# Patient Record
Sex: Female | Born: 1960 | Race: White | Hispanic: No | Marital: Single | Smoking: Never smoker
Health system: Southern US, Community
[De-identification: ages and names within clinical notes are randomized; demographics above are authoritative.]

## PROBLEM LIST (undated history)

## (undated) DIAGNOSIS — N812 Incomplete uterovaginal prolapse: Secondary | ICD-10-CM

## (undated) DIAGNOSIS — J45909 Unspecified asthma, uncomplicated: Secondary | ICD-10-CM

## (undated) DIAGNOSIS — E875 Hyperkalemia: Secondary | ICD-10-CM

## (undated) DIAGNOSIS — F0781 Postconcussional syndrome: Secondary | ICD-10-CM

## (undated) DIAGNOSIS — R404 Transient alteration of awareness: Secondary | ICD-10-CM

## (undated) DIAGNOSIS — E785 Hyperlipidemia, unspecified: Secondary | ICD-10-CM

## (undated) DIAGNOSIS — N289 Disorder of kidney and ureter, unspecified: Secondary | ICD-10-CM

## (undated) DIAGNOSIS — G4730 Sleep apnea, unspecified: Secondary | ICD-10-CM

## (undated) DIAGNOSIS — F329 Major depressive disorder, single episode, unspecified: Secondary | ICD-10-CM

## (undated) DIAGNOSIS — F419 Anxiety disorder, unspecified: Secondary | ICD-10-CM

## (undated) DIAGNOSIS — F319 Bipolar disorder, unspecified: Secondary | ICD-10-CM

## (undated) DIAGNOSIS — R160 Hepatomegaly, not elsewhere classified: Secondary | ICD-10-CM

## (undated) DIAGNOSIS — R011 Cardiac murmur, unspecified: Secondary | ICD-10-CM

## (undated) DIAGNOSIS — S060X9A Concussion with loss of consciousness of unspecified duration, initial encounter: Secondary | ICD-10-CM

## (undated) DIAGNOSIS — M722 Plantar fascial fibromatosis: Secondary | ICD-10-CM

## (undated) DIAGNOSIS — M1990 Unspecified osteoarthritis, unspecified site: Secondary | ICD-10-CM

## (undated) DIAGNOSIS — J449 Chronic obstructive pulmonary disease, unspecified: Secondary | ICD-10-CM

## (undated) DIAGNOSIS — I10 Essential (primary) hypertension: Secondary | ICD-10-CM

## (undated) DIAGNOSIS — E042 Nontoxic multinodular goiter: Secondary | ICD-10-CM

## (undated) DIAGNOSIS — B9689 Other specified bacterial agents as the cause of diseases classified elsewhere: Secondary | ICD-10-CM

## (undated) DIAGNOSIS — I770 Arteriovenous fistula, acquired: Secondary | ICD-10-CM

## (undated) DIAGNOSIS — R4789 Other speech disturbances: Secondary | ICD-10-CM

## (undated) DIAGNOSIS — I509 Heart failure, unspecified: Secondary | ICD-10-CM

## (undated) DIAGNOSIS — J40 Bronchitis, not specified as acute or chronic: Secondary | ICD-10-CM

## (undated) DIAGNOSIS — I471 Supraventricular tachycardia, unspecified: Secondary | ICD-10-CM

## (undated) DIAGNOSIS — N814 Uterovaginal prolapse, unspecified: Secondary | ICD-10-CM

## (undated) DIAGNOSIS — I519 Heart disease, unspecified: Secondary | ICD-10-CM

## (undated) DIAGNOSIS — F32A Depression, unspecified: Secondary | ICD-10-CM

## (undated) DIAGNOSIS — I209 Angina pectoris, unspecified: Secondary | ICD-10-CM

## (undated) DIAGNOSIS — R270 Ataxia, unspecified: Principal | ICD-10-CM

## (undated) DIAGNOSIS — N189 Chronic kidney disease, unspecified: Secondary | ICD-10-CM

## (undated) DIAGNOSIS — I2510 Atherosclerotic heart disease of native coronary artery without angina pectoris: Secondary | ICD-10-CM

## (undated) DIAGNOSIS — J019 Acute sinusitis, unspecified: Secondary | ICD-10-CM

## (undated) DIAGNOSIS — N951 Menopausal and female climacteric states: Secondary | ICD-10-CM

## (undated) DIAGNOSIS — K219 Gastro-esophageal reflux disease without esophagitis: Secondary | ICD-10-CM

## (undated) DIAGNOSIS — N3281 Overactive bladder: Secondary | ICD-10-CM

## (undated) DIAGNOSIS — D510 Vitamin B12 deficiency anemia due to intrinsic factor deficiency: Secondary | ICD-10-CM

## (undated) DIAGNOSIS — E119 Type 2 diabetes mellitus without complications: Secondary | ICD-10-CM

## (undated) DIAGNOSIS — N3945 Continuous leakage: Secondary | ICD-10-CM

## (undated) DIAGNOSIS — K589 Irritable bowel syndrome without diarrhea: Secondary | ICD-10-CM

## (undated) DIAGNOSIS — Z992 Dependence on renal dialysis: Secondary | ICD-10-CM

## (undated) DIAGNOSIS — E0590 Thyrotoxicosis, unspecified without thyrotoxic crisis or storm: Secondary | ICD-10-CM

## (undated) DIAGNOSIS — N200 Calculus of kidney: Secondary | ICD-10-CM

## (undated) DIAGNOSIS — I6529 Occlusion and stenosis of unspecified carotid artery: Secondary | ICD-10-CM

## (undated) DIAGNOSIS — E039 Hypothyroidism, unspecified: Secondary | ICD-10-CM

## (undated) DIAGNOSIS — H532 Diplopia: Secondary | ICD-10-CM

## (undated) DIAGNOSIS — M109 Gout, unspecified: Secondary | ICD-10-CM

## (undated) DIAGNOSIS — R569 Unspecified convulsions: Secondary | ICD-10-CM

## (undated) DIAGNOSIS — Z8349 Family history of other endocrine, nutritional and metabolic diseases: Secondary | ICD-10-CM

## (undated) HISTORY — PX: HERNIA REPAIR: SHX51

## (undated) HISTORY — DX: Postconcussional syndrome: F07.81

## (undated) HISTORY — DX: Overactive bladder: N32.81

## (undated) HISTORY — DX: Transient alteration of awareness: R40.4

## (undated) HISTORY — DX: Cardiac murmur, unspecified: R01.1

## (undated) HISTORY — DX: Type 2 diabetes mellitus without complications: E11.9

## (undated) HISTORY — DX: Morbid (severe) obesity due to excess calories: E66.01

## (undated) HISTORY — DX: Dependence on renal dialysis: Z99.2

## (undated) HISTORY — PX: COLONOSCOPY: SHX174

## (undated) HISTORY — DX: Disorder of kidney and ureter, unspecified: N28.9

## (undated) HISTORY — DX: Occlusion and stenosis of unspecified carotid artery: I65.29

## (undated) HISTORY — DX: Essential (primary) hypertension: I10

## (undated) HISTORY — DX: Acute sinusitis, unspecified: J01.90

## (undated) HISTORY — DX: Vitamin B12 deficiency anemia due to intrinsic factor deficiency: D51.0

## (undated) HISTORY — DX: Gastro-esophageal reflux disease without esophagitis: K21.9

## (undated) HISTORY — PX: OTHER SURGICAL HISTORY: SHX169

## (undated) HISTORY — PX: ENDOMETRIAL BIOPSY: SHX622

## (undated) HISTORY — DX: Hyperlipidemia, unspecified: E78.5

## (undated) HISTORY — DX: Ataxia, unspecified: R27.0

## (undated) HISTORY — DX: Major depressive disorder, single episode, unspecified: F32.9

## (undated) HISTORY — DX: Unspecified convulsions: R56.9

## (undated) HISTORY — DX: Nontoxic multinodular goiter: E04.2

## (undated) HISTORY — DX: Concussion with loss of consciousness of unspecified duration, initial encounter: S06.0X9A

## (undated) HISTORY — DX: Continuous leakage: N39.45

## (undated) HISTORY — DX: Anxiety disorder, unspecified: F41.9

## (undated) HISTORY — DX: Chronic kidney disease, unspecified: N18.9

## (undated) HISTORY — DX: Diplopia: H53.2

## (undated) HISTORY — DX: Depression, unspecified: F32.A

## (undated) HISTORY — DX: Bipolar disorder, unspecified: F31.9

## (undated) HISTORY — DX: Other specified bacterial agents as the cause of diseases classified elsewhere: B96.89

## (undated) HISTORY — DX: Sleep apnea, unspecified: G47.30

## (undated) HISTORY — DX: Family history of other endocrine, nutritional and metabolic diseases: Z83.49

## (undated) HISTORY — DX: Thyrotoxicosis, unspecified without thyrotoxic crisis or storm: E05.90

## (undated) HISTORY — DX: Chronic obstructive pulmonary disease, unspecified: J44.9

## (undated) HISTORY — DX: Heart disease, unspecified: I51.9

## (undated) HISTORY — PX: PORT A CATH REVISION: SHX6033

## (undated) HISTORY — DX: Hyperkalemia: E87.5

## (undated) HISTORY — DX: Bronchitis, not specified as acute or chronic: J40

## (undated) HISTORY — DX: Other speech disturbances: R47.89

## (undated) HISTORY — PX: CHOLECYSTECTOMY: SHX55

## (undated) HISTORY — DX: Unspecified asthma, uncomplicated: J45.909

---

## 2004-05-24 ENCOUNTER — Ambulatory Visit: Payer: Self-pay | Admitting: Obstetrics and Gynecology

## 2004-05-31 ENCOUNTER — Ambulatory Visit: Payer: Self-pay | Admitting: Obstetrics and Gynecology

## 2004-12-20 ENCOUNTER — Ambulatory Visit: Payer: Self-pay | Admitting: Internal Medicine

## 2005-11-24 ENCOUNTER — Observation Stay: Payer: Self-pay | Admitting: Internal Medicine

## 2005-11-24 ENCOUNTER — Other Ambulatory Visit: Payer: Self-pay

## 2005-12-22 ENCOUNTER — Ambulatory Visit: Payer: Self-pay | Admitting: Internal Medicine

## 2006-11-10 ENCOUNTER — Ambulatory Visit: Payer: Self-pay | Admitting: Internal Medicine

## 2006-12-07 ENCOUNTER — Ambulatory Visit: Payer: Self-pay | Admitting: Urology

## 2006-12-29 ENCOUNTER — Ambulatory Visit: Payer: Self-pay | Admitting: Internal Medicine

## 2007-01-13 ENCOUNTER — Ambulatory Visit: Payer: Self-pay | Admitting: Internal Medicine

## 2007-03-04 ENCOUNTER — Ambulatory Visit: Payer: Self-pay | Admitting: Urology

## 2007-06-02 ENCOUNTER — Ambulatory Visit: Payer: Self-pay | Admitting: Rheumatology

## 2007-06-03 ENCOUNTER — Ambulatory Visit: Payer: Self-pay | Admitting: Rheumatology

## 2007-08-10 ENCOUNTER — Ambulatory Visit: Payer: Self-pay | Admitting: Internal Medicine

## 2007-09-03 ENCOUNTER — Ambulatory Visit: Payer: Self-pay | Admitting: Internal Medicine

## 2007-09-06 ENCOUNTER — Ambulatory Visit: Payer: Self-pay | Admitting: Internal Medicine

## 2007-10-04 ENCOUNTER — Ambulatory Visit: Payer: Self-pay | Admitting: Internal Medicine

## 2007-11-03 ENCOUNTER — Ambulatory Visit: Payer: Self-pay | Admitting: Internal Medicine

## 2007-11-29 ENCOUNTER — Ambulatory Visit: Payer: Self-pay | Admitting: Urology

## 2007-12-06 ENCOUNTER — Ambulatory Visit: Payer: Self-pay | Admitting: Internal Medicine

## 2008-01-04 ENCOUNTER — Ambulatory Visit: Payer: Self-pay | Admitting: Internal Medicine

## 2008-02-03 ENCOUNTER — Ambulatory Visit: Payer: Self-pay | Admitting: Internal Medicine

## 2008-04-12 ENCOUNTER — Ambulatory Visit: Payer: Self-pay | Admitting: Nephrology

## 2008-08-03 ENCOUNTER — Ambulatory Visit: Payer: Self-pay | Admitting: Internal Medicine

## 2008-08-07 ENCOUNTER — Ambulatory Visit: Payer: Self-pay | Admitting: Internal Medicine

## 2008-08-10 ENCOUNTER — Ambulatory Visit: Payer: Self-pay | Admitting: Internal Medicine

## 2008-09-02 ENCOUNTER — Ambulatory Visit: Payer: Self-pay | Admitting: Internal Medicine

## 2008-10-03 ENCOUNTER — Ambulatory Visit: Payer: Self-pay | Admitting: Internal Medicine

## 2008-11-02 ENCOUNTER — Ambulatory Visit: Payer: Self-pay | Admitting: Internal Medicine

## 2008-11-27 ENCOUNTER — Ambulatory Visit: Payer: Self-pay | Admitting: Urology

## 2008-12-03 ENCOUNTER — Ambulatory Visit: Payer: Self-pay | Admitting: Internal Medicine

## 2009-01-03 ENCOUNTER — Ambulatory Visit: Payer: Self-pay | Admitting: Internal Medicine

## 2009-02-02 ENCOUNTER — Ambulatory Visit: Payer: Self-pay | Admitting: Internal Medicine

## 2009-03-05 ENCOUNTER — Ambulatory Visit: Payer: Self-pay | Admitting: Internal Medicine

## 2009-04-04 ENCOUNTER — Ambulatory Visit: Payer: Self-pay | Admitting: Internal Medicine

## 2009-05-05 ENCOUNTER — Ambulatory Visit: Payer: Self-pay | Admitting: Internal Medicine

## 2009-06-05 ENCOUNTER — Ambulatory Visit: Payer: Self-pay | Admitting: Internal Medicine

## 2009-07-03 ENCOUNTER — Ambulatory Visit: Payer: Self-pay | Admitting: Internal Medicine

## 2009-08-01 ENCOUNTER — Ambulatory Visit: Payer: Self-pay | Admitting: Internal Medicine

## 2009-08-03 ENCOUNTER — Ambulatory Visit: Payer: Self-pay | Admitting: Internal Medicine

## 2009-09-02 ENCOUNTER — Ambulatory Visit: Payer: Self-pay | Admitting: Internal Medicine

## 2009-10-03 ENCOUNTER — Ambulatory Visit: Payer: Self-pay | Admitting: Internal Medicine

## 2009-10-12 ENCOUNTER — Ambulatory Visit: Payer: Self-pay | Admitting: Internal Medicine

## 2009-11-02 ENCOUNTER — Ambulatory Visit: Payer: Self-pay | Admitting: Internal Medicine

## 2009-11-28 ENCOUNTER — Ambulatory Visit: Payer: Self-pay | Admitting: Urology

## 2009-12-03 ENCOUNTER — Ambulatory Visit: Payer: Self-pay | Admitting: Internal Medicine

## 2010-01-03 ENCOUNTER — Ambulatory Visit: Payer: Self-pay | Admitting: Internal Medicine

## 2010-01-04 ENCOUNTER — Ambulatory Visit: Payer: Self-pay | Admitting: Internal Medicine

## 2010-01-18 ENCOUNTER — Ambulatory Visit: Payer: Self-pay | Admitting: Surgery

## 2010-01-24 ENCOUNTER — Emergency Department: Payer: Self-pay | Admitting: Unknown Physician Specialty

## 2010-02-01 ENCOUNTER — Emergency Department: Payer: Self-pay | Admitting: Emergency Medicine

## 2010-02-02 ENCOUNTER — Ambulatory Visit: Payer: Self-pay | Admitting: Internal Medicine

## 2010-03-05 ENCOUNTER — Ambulatory Visit: Payer: Self-pay | Admitting: Internal Medicine

## 2010-04-04 ENCOUNTER — Ambulatory Visit: Payer: Self-pay | Admitting: Internal Medicine

## 2010-04-23 ENCOUNTER — Ambulatory Visit: Payer: Self-pay | Admitting: Internal Medicine

## 2010-05-03 ENCOUNTER — Ambulatory Visit: Payer: Self-pay | Admitting: Internal Medicine

## 2010-05-17 ENCOUNTER — Ambulatory Visit: Payer: Self-pay | Admitting: Internal Medicine

## 2010-05-26 ENCOUNTER — Emergency Department: Payer: Self-pay | Admitting: Emergency Medicine

## 2010-06-05 ENCOUNTER — Ambulatory Visit: Payer: Self-pay | Admitting: Internal Medicine

## 2010-07-04 ENCOUNTER — Ambulatory Visit: Payer: Self-pay | Admitting: Internal Medicine

## 2010-07-12 ENCOUNTER — Ambulatory Visit: Payer: Self-pay | Admitting: Internal Medicine

## 2010-08-04 ENCOUNTER — Ambulatory Visit: Payer: Self-pay | Admitting: Internal Medicine

## 2010-10-03 ENCOUNTER — Ambulatory Visit: Payer: Self-pay | Admitting: Urology

## 2010-11-11 ENCOUNTER — Ambulatory Visit: Payer: Self-pay | Admitting: Internal Medicine

## 2010-12-17 ENCOUNTER — Emergency Department: Payer: Self-pay | Admitting: Emergency Medicine

## 2011-03-12 DIAGNOSIS — E039 Hypothyroidism, unspecified: Secondary | ICD-10-CM | POA: Insufficient documentation

## 2011-06-22 ENCOUNTER — Emergency Department: Payer: Self-pay | Admitting: Emergency Medicine

## 2011-06-22 LAB — CBC
HCT: 28.5 % — ABNORMAL LOW (ref 35.0–47.0)
HGB: 9.4 g/dL — ABNORMAL LOW (ref 12.0–16.0)
MCH: 32.3 pg (ref 26.0–34.0)
MCHC: 33.1 g/dL (ref 32.0–36.0)
MCV: 98 fL (ref 80–100)
Platelet: 246 10*3/uL (ref 150–440)
RBC: 2.93 10*6/uL — ABNORMAL LOW (ref 3.80–5.20)
RDW: 12.3 % (ref 11.5–14.5)
WBC: 9.9 10*3/uL (ref 3.6–11.0)

## 2011-06-22 LAB — BASIC METABOLIC PANEL
Anion Gap: 12 (ref 7–16)
BUN: 93 mg/dL — ABNORMAL HIGH (ref 7–18)
Calcium, Total: 11.2 mg/dL — ABNORMAL HIGH (ref 8.5–10.1)
Chloride: 105 mmol/L (ref 98–107)
Co2: 22 mmol/L (ref 21–32)
Creatinine: 2.5 mg/dL — ABNORMAL HIGH (ref 0.60–1.30)
EGFR (African American): 26 — ABNORMAL LOW
EGFR (Non-African Amer.): 22 — ABNORMAL LOW
Glucose: 177 mg/dL — ABNORMAL HIGH (ref 65–99)
Osmolality: 311 (ref 275–301)
Potassium: 4.3 mmol/L (ref 3.5–5.1)
Sodium: 139 mmol/L (ref 136–145)

## 2011-06-22 LAB — CK TOTAL AND CKMB (NOT AT ARMC)
CK, Total: 49 U/L (ref 21–215)
CK-MB: <0.5 ng/mL — ABNORMAL LOW (ref 0.5–3.6)

## 2011-06-22 LAB — TROPONIN I
Troponin-I: <0.02 ng/mL
Troponin-I: <0.02 ng/mL

## 2011-07-04 ENCOUNTER — Emergency Department: Payer: Self-pay | Admitting: Emergency Medicine

## 2011-07-04 LAB — URINALYSIS, COMPLETE
Bilirubin,UR: NEGATIVE
Blood: NEGATIVE
Glucose,UR: NEGATIVE mg/dL (ref 0–75)
Ketone: NEGATIVE
Leukocyte Esterase: NEGATIVE
Nitrite: NEGATIVE
Ph: 5 (ref 4.5–8.0)
Protein: 30
RBC,UR: 1 /HPF (ref 0–5)
Specific Gravity: 1.008 (ref 1.003–1.030)
Squamous Epithelial: 1
WBC UR: 1 /HPF (ref 0–5)

## 2011-07-04 LAB — COMPREHENSIVE METABOLIC PANEL
Albumin: 3.8 g/dL (ref 3.4–5.0)
Alkaline Phosphatase: 188 U/L — ABNORMAL HIGH (ref 50–136)
Anion Gap: 12 (ref 7–16)
BUN: 65 mg/dL — ABNORMAL HIGH (ref 7–18)
Bilirubin,Total: 0.2 mg/dL (ref 0.2–1.0)
Calcium, Total: 8.6 mg/dL (ref 8.5–10.1)
Chloride: 109 mmol/L — ABNORMAL HIGH (ref 98–107)
Co2: 20 mmol/L — ABNORMAL LOW (ref 21–32)
Creatinine: 1.95 mg/dL — ABNORMAL HIGH (ref 0.60–1.30)
EGFR (African American): 35 — ABNORMAL LOW
EGFR (Non-African Amer.): 29 — ABNORMAL LOW
Glucose: 198 mg/dL — ABNORMAL HIGH (ref 65–99)
Osmolality: 305 (ref 275–301)
Potassium: 4.6 mmol/L (ref 3.5–5.1)
SGOT(AST): 11 U/L — ABNORMAL LOW (ref 15–37)
SGPT (ALT): 14 U/L
Sodium: 141 mmol/L (ref 136–145)
Total Protein: 7.3 g/dL (ref 6.4–8.2)

## 2011-07-04 LAB — CBC
HCT: 26.6 % — ABNORMAL LOW (ref 35.0–47.0)
HGB: 9.1 g/dL — ABNORMAL LOW (ref 12.0–16.0)
MCH: 33.3 pg (ref 26.0–34.0)
MCHC: 34 g/dL (ref 32.0–36.0)
MCV: 98 fL (ref 80–100)
Platelet: 200 10*3/uL (ref 150–440)
RBC: 2.72 10*6/uL — ABNORMAL LOW (ref 3.80–5.20)
RDW: 13.4 % (ref 11.5–14.5)
WBC: 8.6 10*3/uL (ref 3.6–11.0)

## 2011-07-04 LAB — TROPONIN I
Troponin-I: <0.02 ng/mL
Troponin-I: <0.02 ng/mL

## 2011-07-04 LAB — CK TOTAL AND CKMB (NOT AT ARMC)
CK, Total: 49 U/L (ref 21–215)
CK-MB: <0.5 ng/mL — ABNORMAL LOW (ref 0.5–3.6)

## 2011-07-04 LAB — PROTIME-INR
INR: 1
Prothrombin Time: 13.7 secs (ref 11.5–14.7)

## 2011-07-04 LAB — APTT: Activated PTT: 36.8 secs — ABNORMAL HIGH (ref 23.6–35.9)

## 2011-10-28 DIAGNOSIS — E875 Hyperkalemia: Secondary | ICD-10-CM | POA: Insufficient documentation

## 2011-10-29 DIAGNOSIS — R011 Cardiac murmur, unspecified: Secondary | ICD-10-CM | POA: Insufficient documentation

## 2011-11-12 ENCOUNTER — Ambulatory Visit: Payer: Self-pay | Admitting: Internal Medicine

## 2011-11-13 ENCOUNTER — Ambulatory Visit: Payer: Self-pay | Admitting: Urology

## 2012-01-29 DIAGNOSIS — E042 Nontoxic multinodular goiter: Secondary | ICD-10-CM | POA: Insufficient documentation

## 2012-09-08 DIAGNOSIS — Z0000 Encounter for general adult medical examination without abnormal findings: Secondary | ICD-10-CM | POA: Insufficient documentation

## 2012-09-14 ENCOUNTER — Ambulatory Visit: Payer: Self-pay | Admitting: Gastroenterology

## 2012-09-15 LAB — PATHOLOGY REPORT

## 2012-10-20 ENCOUNTER — Telehealth: Payer: Self-pay | Admitting: *Deleted

## 2012-10-20 NOTE — Telephone Encounter (Signed)
Message copied by Mauri Pole on Wed Oct 20, 2012  4:08 PM ------      Message from: Brien Mates B      Created: Wed Oct 20, 2012  3:55 PM      Contact: Olivia Mackie w/Dr. Doy Hutching w/[facility]       Olivia Mackie is from Dr. Doy Hutching office called states pt is doing a lot of falling daily now and needs to get in sooner to see Dr. Brett Fairy. She has an apt but not till Aug. Can someone please call pt to see if we can get her in sooner. Thanks  ------

## 2012-11-09 ENCOUNTER — Encounter: Payer: Self-pay | Admitting: Neurology

## 2012-11-10 ENCOUNTER — Encounter: Payer: Self-pay | Admitting: Neurology

## 2012-11-10 ENCOUNTER — Ambulatory Visit (INDEPENDENT_AMBULATORY_CARE_PROVIDER_SITE_OTHER): Payer: Medicare Other | Admitting: Neurology

## 2012-11-10 VITALS — BP 130/70 | HR 75 | Ht 62.0 in | Wt 221.0 lb

## 2012-11-10 DIAGNOSIS — R4789 Other speech disturbances: Secondary | ICD-10-CM | POA: Insufficient documentation

## 2012-11-10 DIAGNOSIS — R270 Ataxia, unspecified: Secondary | ICD-10-CM

## 2012-11-10 DIAGNOSIS — R404 Transient alteration of awareness: Secondary | ICD-10-CM

## 2012-11-10 DIAGNOSIS — R279 Unspecified lack of coordination: Secondary | ICD-10-CM

## 2012-11-10 DIAGNOSIS — R479 Unspecified speech disturbances: Secondary | ICD-10-CM | POA: Insufficient documentation

## 2012-11-10 HISTORY — DX: Ataxia, unspecified: R27.0

## 2012-11-10 HISTORY — DX: Transient alteration of awareness: R40.4

## 2012-11-10 HISTORY — DX: Other speech disturbances: R47.89

## 2012-11-10 MED ORDER — LAMOTRIGINE 200 MG PO TABS: 200.0000 mg | ORAL_TABLET | Freq: Two times a day (BID) | ORAL | Status: DC

## 2012-11-10 MED ORDER — CLONAZEPAM 0.5 MG PO TABS: 0.5000 mg | ORAL_TABLET | Freq: Every day | ORAL | Status: DC

## 2012-11-10 NOTE — Progress Notes (Signed)
[facility]  Provider:  Dr Brett Fairy Referring Provider: / Primary Care Physician:  Idelle Crouch, MD  Recent falls- chief complaint.   HPI:  Nina Johnston is a 52 y.o. female here as a referral from Dr. Geryl Councilman, and Dr Doy Hutching.  Nina Johnston, 52 year old, Caucasian right-handed female has been followed in this practice since 2009. 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   She had such spells in August 2010 and September 2011 after carbamazepine was changed to a generic form of Carbatrol her blood levels were lower,  she also developed upper respiratory tract infections and pneumonia in 2011 and was unable for a while to use her CPAP which had been initiated in [place] for the treatment of obstructive sleep apnea.   Paroxysmal events with mental status changes had continued,   03/17/2011  and 20 13 some of the spells are described as  Automatisms- continuing  Behavior while  the patient  Is staring off and acting without  reflecting not being  responsive to stimulation from the outside . After the spell passes,  she became very sleepy each time.   In August 2012 she was finally referred to an endocrinologist at [facility] after she had a hypoglycemic seizure and  Her diabetes medications were reduced, now her blood sugars are running between 150 and 200 in the morning fasting. [name] begun working out in 2030 with a Physiological scientist and was able to lose about 30 pounds . She returned to using her CPAP regularly her Hb A1 C. in October of last year was 6.1-  she had no other hypoglycemic events reported  Since March 2013 .  Also in 20 13 she had to visit the local ER twice  for  abdominal spasms and cardiac / chest pain.  She has been followed for a decreased renal clearance by internal medicine and nephrology.  Patient reports a fall in June 2014 ,  triggered by a uneven surface (The curb of a sidewalk)- she sprained her right ankle in another fall May 25 th, and bruised her left knee in a fall in  April.   The patient underwent a colonoscopy on May 6 which revealed diverticulosis but no malignancies or other significant findings. 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   The patient is currently treated for her spells as Lamictal, zZonisamide , and remains on Tegretol.  It she will need blood levels for her anti-epiletic  medications. Dr. Geryl Councilman is debating to reduce her Klonopin , which may help reduce her fall risk as well. Vit D deficiency. Treated by supplement .     Review of Systems: Out of a complete 14 system review, the patient complains of only the following symptoms:   other reviewed systems are negative. High fall risk 10 points, none by age , 5 from medications.    History   Social History  . Marital Status: Single    Spouse Name: N/A    Number of Children: 0  . Years of Education:  college   Occupational History  . not employed    Social History Main Topics  . Smoking status: Never Smoker   . Smokeless  tobacco: Not on file  . Alcohol Use: No  . Drug Use: No  . Sexually Active: Not on file   Other Topics Concern  . Not on file   Social History Narrative  . No narrative on file    Family History  Problem Relation Age of Onset  . Diabetes Mother   . Lumbar disc disease Mother   . Migraines Mother   . Cancer - Prostate Father     mild    Past Medical History  Diagnosis Date  . Morbid obesity   . Hypertension   . Family history of thyroid problem   . Anxiety and depression   . Diabetes mellitus without complication   . Seizures   . Heart disease      enlarged because of COPD  . Renal disease     w/ GFR 29-may be due to diabetes  . Bronchitis   . Asthma     Past Surgical History  Procedure Laterality Date  . Cholecystectomy    . Endometrial biopsy      ablation, uterine    Current Outpatient Prescriptions  Medication Sig Dispense Refill  . amLODipine (NORVASC) 5 MG tablet Take 5 mg by mouth daily.      Marland Kitchen aspirin 81 MG tablet Take 81 mg by mouth at bedtime as needed for pain.      . carbamazepine (TEGRETOL-XR) 100 MG 12 hr tablet Take 100 mg by mouth 3 (three) times daily. 2 tablets in am , 1 tablet in evening      . clonazePAM (KLONOPIN) 0.5 MG tablet Take 0.5 mg by mouth daily. 1/2 at bedtime      . Cyanocobalamin (VITAMIN B 12 PO) Take by mouth. One time monthly      . Docusate Calcium (STOOL SOFTENER PO) Take by mouth 2 (two) times daily. 2 regular in am , 1 with stimulant in am      . ethosuximide (ZARONTIN) 250 MG/5ML solution Take 20 mg/kg by mouth 2 (two) times daily.      . Febuxostat (ULORIC) 80 MG TABS Take by mouth daily. Morning dosage      . FERROUS SULFATE PO Take by mouth at bedtime as needed. Am, bedtime      . fexofenadine (ALLEGRA) 180 MG tablet Take 180 mg by mouth daily. am      . Fluticasone-Salmeterol (ADVAIR DISKUS) 250-50 MCG/DOSE AEPB Inhale 1 puff into the lungs 2 (two) times daily.      . furosemide (LASIX) 20 MG tablet Take 20 mg by mouth. Am dosage      . gemfibrozil (LOPID) 600 MG tablet Take 600 mg by mouth 2 (two) times daily before a meal.      . glipiZIDE (GLUCOTROL) 5 MG tablet Take 5 mg by mouth 2 (two) times daily before a meal.      . GuaiFENesin (MUCINEX PO) Take by mouth. Expectorant, as needed      . hydrALAZINE (APRESOLINE) 50 MG tablet Take 50 mg by mouth 3 (three) times daily.      . irbesartan (AVAPRO) 300 MG tablet Take 300 mg by mouth daily. 1/2 am dosage      . LamoTRIgine (LAMICTAL PO) Take 300 mg by mouth 2 (two) times daily. Glaxo -brand name      . levothyroxine (SYNTHROID)  150 MCG tablet Take 150 mcg by mouth daily. Am dosage      . Liraglutide (VICTOZA) 18 MG/3ML SOPN Inject into the skin daily.      Marland Kitchen  LORazepam (ATIVAN) 1 MG tablet Take 1 mg by mouth daily as needed for anxiety.      . magnesium oxide (MAG-OX) 400 MG tablet Take 400 mg by mouth daily. am      . metoprolol succinate (TOPROL XL) 50 MG 24 hr tablet Take 50 mg by mouth 2 (two) times daily. Take with or immediately following a meal.      . montelukast (SINGULAIR) 10 MG tablet Take 10 mg by mouth at bedtime.      Marland Kitchen omeprazole (PRILOSEC) 20 MG capsule Take 20 mg by mouth 2 (two) times daily.      . pioglitazone (ACTOS) 30 MG tablet Take 30 mg by mouth daily. Am dosage      . polyethylene glycol (MIRALAX) packet Take 17 g by mouth daily. 17 mg, Am dosage, 1/2 dose qhs      . predniSONE (DELTASONE) 10 MG tablet Take 10 mg by mouth daily. 6 day decreasing number      . sevelamer (RENAGEL) 800 MG tablet Take 800 mg by mouth 3 (three) times daily with meals.      . vitamin C (ASCORBIC ACID) 500 MG tablet Take 500 mg by mouth every other day.      . Vitamin D, Ergocalciferol, (DRISDOL) 50000 UNITS CAPS Take 50,000 Units by mouth daily.       No current facility-administered medications for this visit.    Allergies as of 11/10/2012 - Review Complete 11/10/2012  Allergen Reaction Noted  . Cinnamon  11/09/2012  . Procrit (epoetin alfa)  11/10/2012    Vitals: BP 130/70  Pulse 75  Ht 5\' 2"  (1.575 m)  Wt 221 lb (100.245 kg)  BMI 40.41 kg/m2 Last Weight:  Wt Readings from Last 1 Encounters:  11/10/12 221 lb (100.245 kg)   Last Height:   Ht Readings from Last 1 Encounters:  11/10/12 5\' 2"  (1.575 m)   Vision Screening:   Physical exam:  General: The patient is awake, alert and appears not in acute distress. The patient is well groomed. Head: Normocephalic, atraumatic. Neck is supple. Mallampati 2 - there is no uvula , neck circumference:16 inches.  Cardiovascular:  Regular rate and rhythm,  without  murmurs or carotid bruit, and without distended neck veins. Respiratory: Lungs are clear to auscultation. Skin: ankle  edema, no rash Trunk: BMI is morbidly obese-  patient  has normal posture.  Neurologic exam : The patient is awake and alert, oriented to place and time.  Memory subjective described as intact. There is a normal attention span & concentration ability.  Speech is fluent without dysarthria or aphasia. Mood and affect are appropriate.  Cranial nerves: Pupils are equal and briskly reactive to light. Funduscopic exam without evidence of pallor or edema.  Extraocular movements  in vertical and horizontal planes intact and without nystagmus. Visual fields by finger perimetry are intact. Hearing to finger rub intact.  Facial sensation intact to fine touch. Facial motor strength is symmetric and tongue moves midline.  Motor exam:   Normal tone and normal muscle bulk, obesity related attenuated DTR , and symmetric normal strength in all extremities.  Sensory:  Fine touch, pinprick and vibration were tested in all extremities and are presents  in toes and feet.  Proprioception is tested and normal.  Coordination: Rapid alternating movements in the fingers/hands is tested and normal.  Finger-to-nose maneuver tested and normal without evidence of ataxia, dysmetria or tremor.  Gait and station: Patient walks without assistive device and is  very slow -  Appears truncally rigid , with no lumbar rotation, turns with 5 steps, and has reduced step width. Strength within normal limits. Stance is stable and normal. Tandem gait is fragmented. Romberg testing is abnormal. Needed to brace herself , help to get up from seated position.   Deep tendon reflexes: in the  upper and lower extremities are symmetric and intact. Babinski maneuver response is down going on the left and equivocal on the right. .   Assessment:  After physical and neurologic examination, review of laboratory studies,  imaging, neurophysiology testing and pre-existing records, assessment will be reviewed on the problem list.  Multifactorial gait disorder, There is no clear evidence of neuropathy in this patient with diabetes , who has  preserved vibratory sense tells and ankles.  However ataxia on tandem gait is evident and this could well be related to Tegretol. She denies any diplopia.  There was no impairment of the upper extremity finger-nose maneuver. Her diabetes is poorly controlled at this point but she has not had hypoglycemic events which has had for the past sometimes look like convulsive seizures SEIZURES.  FOR GAIT STABILIZATION AND FALL PREVENTION A DAILY EXERCISE ROUTINE WOULD BE IMPORTANT. I WOULD LIKE FOR HER TO RETURN TO HER WEIGHT LOSS PROGRAM AS A PERSONAL TRAINER IF POSSIBLE. I ALSO WILL GIVE SOME ADDITIONAL INFORMATION ABOUT WEIGHT LOSS PROGRAMS THAT ARE DIRECTED TOWARDS PATIENTS WITH DIABETES.      Plan:  Treatment plan and additional workup will be reviewed under Problem List.

## 2012-11-10 NOTE — Patient Instructions (Signed)

## 2012-11-11 ENCOUNTER — Other Ambulatory Visit: Payer: Self-pay

## 2012-11-11 DIAGNOSIS — R4789 Other speech disturbances: Secondary | ICD-10-CM

## 2012-11-11 DIAGNOSIS — R270 Ataxia, unspecified: Secondary | ICD-10-CM

## 2012-11-11 DIAGNOSIS — R404 Transient alteration of awareness: Secondary | ICD-10-CM

## 2012-11-11 MED ORDER — LAMOTRIGINE 200 MG PO TABS: 300.0000 mg | ORAL_TABLET | Freq: Two times a day (BID) | ORAL | Status: DC

## 2012-11-11 NOTE — Telephone Encounter (Signed)
Pharmacy sent [place] a fax asking that we clarify directions on Lamictal and resend Rx.  It appears Rx was entered with 2 different directions.  I have updated it and resent Rx to the pharmacy.

## 2012-11-12 ENCOUNTER — Ambulatory Visit: Payer: Self-pay | Admitting: Internal Medicine

## 2012-11-12 ENCOUNTER — Telehealth: Payer: Self-pay | Admitting: Neurology

## 2012-11-15 NOTE — Telephone Encounter (Signed)
Sent to referrals ([facility])

## 2012-12-08 ENCOUNTER — Ambulatory Visit: Payer: Self-pay | Admitting: Neurology

## 2012-12-16 DIAGNOSIS — Z6837 Body mass index (BMI) 37.0-37.9, adult: Secondary | ICD-10-CM | POA: Insufficient documentation

## 2012-12-16 DIAGNOSIS — K219 Gastro-esophageal reflux disease without esophagitis: Secondary | ICD-10-CM | POA: Insufficient documentation

## 2012-12-16 DIAGNOSIS — G4733 Obstructive sleep apnea (adult) (pediatric): Secondary | ICD-10-CM | POA: Insufficient documentation

## 2012-12-16 DIAGNOSIS — D510 Vitamin B12 deficiency anemia due to intrinsic factor deficiency: Secondary | ICD-10-CM | POA: Insufficient documentation

## 2012-12-16 DIAGNOSIS — F319 Bipolar disorder, unspecified: Secondary | ICD-10-CM | POA: Insufficient documentation

## 2012-12-16 DIAGNOSIS — G40909 Epilepsy, unspecified, not intractable, without status epilepticus: Secondary | ICD-10-CM | POA: Insufficient documentation

## 2013-04-06 ENCOUNTER — Telehealth: Payer: Self-pay | Admitting: Neurology

## 2013-04-18 ENCOUNTER — Ambulatory Visit: Payer: Self-pay | Admitting: Internal Medicine

## 2013-05-05 ENCOUNTER — Ambulatory Visit: Payer: Self-pay | Admitting: Internal Medicine

## 2013-05-11 ENCOUNTER — Ambulatory Visit: Payer: Medicare Other | Admitting: Neurology

## 2013-06-03 DIAGNOSIS — Z7682 Awaiting organ transplant status: Secondary | ICD-10-CM | POA: Insufficient documentation

## 2013-06-05 ENCOUNTER — Ambulatory Visit: Payer: Self-pay | Admitting: Internal Medicine

## 2013-07-03 ENCOUNTER — Ambulatory Visit: Payer: Self-pay | Admitting: Internal Medicine

## 2013-07-05 ENCOUNTER — Telehealth: Payer: Self-pay | Admitting: Neurology

## 2013-07-05 DIAGNOSIS — R270 Ataxia, unspecified: Secondary | ICD-10-CM

## 2013-07-05 DIAGNOSIS — R404 Transient alteration of awareness: Secondary | ICD-10-CM

## 2013-07-05 DIAGNOSIS — R4789 Other speech disturbances: Secondary | ICD-10-CM

## 2013-07-05 MED ORDER — LAMOTRIGINE 200 MG PO TABS: 300.0000 mg | ORAL_TABLET | Freq: Two times a day (BID) | ORAL | Status: DC

## 2013-07-05 NOTE — Telephone Encounter (Signed)
Rx has been sent  

## 2013-07-05 NOTE — Telephone Encounter (Signed)
[facility] called and stated that Nina Johnston needed a refill on her Lamictal prescription.  Please call if there are any questions.  Thank you

## 2013-07-18 ENCOUNTER — Other Ambulatory Visit: Payer: Self-pay | Admitting: Neurology

## 2013-07-27 ENCOUNTER — Encounter: Payer: Self-pay | Admitting: Obstetrics and Gynecology

## 2013-08-03 ENCOUNTER — Encounter: Payer: Self-pay | Admitting: Obstetrics and Gynecology

## 2013-08-24 ENCOUNTER — Ambulatory Visit (INDEPENDENT_AMBULATORY_CARE_PROVIDER_SITE_OTHER): Payer: Medicare Other | Admitting: Neurology

## 2013-08-24 ENCOUNTER — Telehealth: Payer: Self-pay

## 2013-08-24 ENCOUNTER — Encounter (INDEPENDENT_AMBULATORY_CARE_PROVIDER_SITE_OTHER): Payer: Self-pay

## 2013-08-24 ENCOUNTER — Encounter: Payer: Self-pay | Admitting: Neurology

## 2013-08-24 VITALS — BP 148/80 | HR 76 | Resp 18 | Ht 64.0 in | Wt 211.0 lb

## 2013-08-24 DIAGNOSIS — R270 Ataxia, unspecified: Secondary | ICD-10-CM

## 2013-08-24 DIAGNOSIS — R279 Unspecified lack of coordination: Secondary | ICD-10-CM

## 2013-08-24 DIAGNOSIS — R404 Transient alteration of awareness: Secondary | ICD-10-CM

## 2013-08-24 DIAGNOSIS — R4789 Other speech disturbances: Secondary | ICD-10-CM

## 2013-08-24 MED ORDER — ETHOSUXIMIDE 250 MG/5ML PO SOLN: 20.0000 mg/kg | Freq: Two times a day (BID) | ORAL | Status: DC

## 2013-08-24 MED ORDER — CARBAMAZEPINE ER 100 MG PO TB12: ORAL_TABLET | ORAL | Status: DC

## 2013-08-24 MED ORDER — LAMOTRIGINE 200 MG PO TABS: 300.0000 mg | ORAL_TABLET | Freq: Two times a day (BID) | ORAL | Status: DC

## 2013-08-24 NOTE — Progress Notes (Signed)
[facility]  Provider:  Dr Brett Fairy Referring Provider: / Primary Care Physician:  Idelle Crouch, MD  Recent HA- chief complaint.   HPI:  Nina Johnston is a 53 y.o. female here for a RV :  Routine Rv for refills. Patient is in the process for a renal transplant at [facility]. Med Record number :  [id number]  Nina Johnston renal function has declined over the last 3-1/2 years. She is not considered end chronic renal disease stage III. She developed anemia.  Her diabetes is better controlled and her HTN is the main factor in her ESRD. She goes twice at night to the bathroom.  She doesn't report diaphoresis, no falls and no dizziness. She had a spell of frequent headaches during December 2014 , now resolved.    Note contact at [facility]; transplant. Dr . Talmadge Coventry,  [facility] [place] [id number].          LAST NOTE _ HISTORY : This  53 year old, Caucasian right-handed female has been followed in this practice since 2009. 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  She had such spells in August 2010 and September 2011 after carbamazepine was changed to a generic form of Carbatrol her blood levels were lower,  she also developed upper respiratory tract infections and pneumonia in 2011 and was unable for a while to use her CPAP which had been initiated in [place] for the treatment of obstructive sleep apnea. Paroxysmal events with mental status changes had continued,  03/17/2011  and 20 13 some of the spells are described as " automatisms"- continuing behavior while  the patient is staring off and acting without  reflecting not being  responsive to stimulation from the outside .  After the spell passes, she became very sleepy each time.   In August 2012 she was finally referred to an endocrinologist at [facility] after she had a hypoglycemic seizure and  Her diabetes medications were reduced, now her blood sugars are running between 150 and 200 in the morning fasting. Nina Johnston begun working out in 2030 with a Physiological scientist and was able to lose about 30 pounds . She returned to using her CPAP regularly her Hb A1 C. in October of last year was 6.1-  she had no other hypoglycemic events reported  Since March 2013 .  Also in 20 13 she had to visit the local ER twice  for abdominal spasms and cardiac / chest pain.  She has been followed for a decreased renal clearance by internal medicine and nephrology.  Patient reports a fall in June 2014 ,  triggered by a uneven surface (The curb of a sidewalk)- she sprained her right ankle in another fall May 25 th, and bruised her left knee in a fall in  April. The patient underwent a colonoscopy on May 6 which revealed diverticulosis but no malignancies or other significant findings. 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 The patient is currently treated for her spells as Lamictal, zZonisamide , and remains on Tegretol.  It she will need blood levels for her anti-epiletic  medications. Dr. Geryl Councilman is debating to reduce her Klonopin , which may help reduce  her fall risk as well. Vit D deficiency. Treated by supplement .     Review of Systems: Out of a complete 14 system review, the patient complains of only the following symptoms:   other reviewed systems are negative. High fall risk 10 points, none by age , 5 from medications.    History   Social History  . Marital Status: Single    Spouse Name: N/A    Number of Children: 0  . Years of Education:  college   Occupational History  . not employed    Social History Main Topics  . Smoking status: Never  Smoker   . Smokeless tobacco: Never Used  . Alcohol Use: No  . Drug Use: No  . Sexual Activity: Not on file   Other Topics Concern  . Not on file   Social History Narrative   Patient is single and lives with her parents.   Patient is disabled.   Patient has a college education.   Patient is right- handed.   Patient drinks tea occasionally.    Family History  Problem Relation Age of Onset  . Diabetes Mother   . Lumbar disc disease Mother   . Migraines Mother   . Cancer - Prostate Father     mild    Past Medical History  Diagnosis Date  . Morbid obesity   . Hypertension   . Family history of thyroid problem   . Anxiety and depression   . Diabetes mellitus without complication   . Seizures   . Heart disease     enlarged because of COPD  . Renal disease     w/ GFR 29-may be due to diabetes  . Bronchitis   . Asthma   . 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 Transient alteration of awareness 11/10/2012    Past Surgical History  Procedure Laterality Date  . Cholecystectomy    . Endometrial biopsy      ablation, uterine    Current Outpatient Prescriptions  Medication Sig Dispense Refill  . amLODipine (NORVASC) 5 MG tablet Take 5 mg by mouth daily.      Marland Kitchen aspirin 81 MG tablet Take 81 mg by mouth at bedtime as needed for pain.      . carbamazepine (TEGRETOL-XR) 100 MG 12 hr tablet Two tablets in the morning and one tablet in the evening.  270 tablet  1  . clonazePAM (KLONOPIN) 0.5 MG tablet Take 1 tablet (0.5 mg total) by mouth daily. 1/2 at bedtime  30 tablet  1  . Cyanocobalamin (VITAMIN B 12 PO) Take by mouth. One time monthly      . Docusate Calcium (STOOL SOFTENER PO) Take by mouth 2 (two) times daily. 2 regular in am , 1 with stimulant in am      . ethosuximide (ZARONTIN) 250 MG/5ML solution Take 20 mg/kg by mouth 2 (two) times daily.      . Febuxostat  (ULORIC) 80 MG TABS Take by mouth daily. Morning dosage      . FERROUS SULFATE PO Take by mouth at bedtime as needed. Am, bedtime      . fexofenadine (ALLEGRA) 180 MG tablet Take 180 mg by mouth daily. am      . Fluticasone-Salmeterol (ADVAIR DISKUS) 250-50 MCG/DOSE AEPB Inhale 1 puff into the lungs 2 (two) times daily.      Marland Kitchen  furosemide (LASIX) 20 MG tablet Take 20 mg by mouth. Am dosage      . gemfibrozil (LOPID) 600 MG tablet Take 600 mg by mouth 2 (two) times daily before a meal.      . glipiZIDE (GLUCOTROL) 5 MG tablet Take 5 mg by mouth 2 (two) times daily before a meal.      . GuaiFENesin (MUCINEX PO) Take by mouth. Expectorant, as needed      . hydrALAZINE (APRESOLINE) 50 MG tablet Take 50 mg by mouth 3 (three) times daily.      . irbesartan (AVAPRO) 300 MG tablet Take 300 mg by mouth daily. 1/2 am dosage      . lamoTRIgine (LAMICTAL) 200 MG tablet Take 1.5 tablets (300 mg total) by mouth 2 (two) times daily. Brand Medically Necessary  90 tablet  5  . levothyroxine (SYNTHROID) 150 MCG tablet Take 150 mcg by mouth daily. Am dosage      . Liraglutide (VICTOZA) 18 MG/3ML SOPN Inject into the skin daily.      . magnesium oxide (MAG-OX) 400 MG tablet Take 400 mg by mouth daily. am      . metoprolol succinate (TOPROL XL) 50 MG 24 hr tablet Take 50 mg by mouth 2 (two) times daily. Take with or immediately following a meal.      . montelukast (SINGULAIR) 10 MG tablet Take 10 mg by mouth at bedtime.      Marland Kitchen omeprazole (PRILOSEC) 20 MG capsule Take 20 mg by mouth 2 (two) times daily.      . pioglitazone (ACTOS) 30 MG tablet Take 30 mg by mouth daily. Am dosage      . polyethylene glycol (MIRALAX) packet Take 17 g by mouth daily. 17 mg, Am dosage, 1/2 dose qhs      . predniSONE (DELTASONE) 10 MG tablet Take 10 mg by mouth daily. 6 day decreasing number      . sevelamer (RENAGEL) 800 MG tablet Take 800 mg by mouth 3 (three) times daily with meals.      . vitamin C (ASCORBIC ACID) 500 MG tablet Take  500 mg by mouth every other day.      . Vitamin D, Ergocalciferol, (DRISDOL) 50000 UNITS CAPS Take 50,000 Units by mouth daily.       No current facility-administered medications for this visit.    Allergies as of 08/24/2013 - Review Complete 08/24/2013  Allergen Reaction Noted  . Cinnamon  11/09/2012  . Procrit [epoetin alfa]  11/10/2012    Vitals: BP 148/80  Pulse 76  Resp 18  Ht 5\' 4"  (1.626 m)  Wt 211 lb (95.709 kg)  BMI 36.20 kg/m2 Last Weight:  Wt Readings from Last 1 Encounters:  08/24/13 211 lb (95.709 kg)   Last Height:   Ht Readings from Last 1 Encounters:  08/24/13 5\' 4"  (1.626 m)   Vision Screening:   Physical exam:  General: The patient is awake, alert and appears not in acute distress. The patient is well groomed. Head: Normocephalic, atraumatic. Neck is supple. Mallampati 1 - there is no uvula , neck circumference:15.5 inches.   Low potassium diet. Lost 35 pounds  Cardiovascular:  Regular rate and rhythm, without  murmurs or carotid bruit, and without distended neck veins. Respiratory: Lungs are clear to auscultation. Skin: ankle  edema, no rash Trunk: BMI is morbidly obese-  patient  has normal posture.  Neurologic exam : The patient is awake and alert, oriented to place and time.  Memory subjective  described as intact. There is a normal attention span & concentration ability.  Speech is fluent without dysarthria or aphasia. Mood and affect are appropriate.  Cranial nerves: Pupils are equal and briskly reactive to light. Funduscopic exam without evidence of pallor or edema.  Extraocular movements  in vertical and horizontal planes intact and without nystagmus. Visual fields by finger perimetry are intact. Hearing to finger rub intact.  Facial sensation intact to fine touch. Facial motor strength is symmetric and tongue moves midline.  Motor exam:   Normal tone and normal muscle bulk, obesity related attenuated DTR , and symmetric normal strength in all  extremities.  Sensory:  Fine touch, pinprick and vibration were tested in all extremities and are presents  in toes and feet.  Proprioception is tested and normal.  Coordination: Rapid alternating movements in the fingers/hands is tested and normal.  Finger-to-nose maneuver tested and normal without evidence of ataxia, dysmetria or tremor.  Gait and station: Patient walks without assistive device and is very slow -  Appears truncally rigid , with no lumbar rotation, turns with 5 steps, and has reduced step width. Strength within normal limits. Stance is stable and normal. Tandem gait is fragmented. Romberg testing is abnormal. Needed to brace herself , help to get up from seated position.   Deep tendon reflexes: in the  upper and lower extremities are symmetric and intact. Babinski maneuver response is down going on the left and equivocal on the right. .   Assessment:  After physical and neurologic examination, review of laboratory studies, imaging, neurophysiology testing and pre-existing records, assessment will be reviewed on the problem list.  There is no clear evidence of neuropathy in this patient with diabetes , who has  preserved vibratory sense in both ankles.  However ataxia on tandem gait is evident; this could well be related to long term Tegretol. She denies any diplopia. She has no nystagmus.   There was no impairment of the upper extremity finger-nose maneuver.   Plan:  Treatment plan and additional workup will be reviewed under Problem List.  Her diabetes is better controlled at this point - she has not had hypoglycemic events which has had for the past sometimes look like convulsive seizures . Her HTN is improving. Her renal function is declining, she has not yet had a fistula placed, she is evaluated for a possible donor organ.  Her spells have not occurred for over 6 month , Rv yearly with NP or me.

## 2013-08-24 NOTE — Telephone Encounter (Signed)
I called ins regarding Prior Auth for Lamictal BMN.  Spoke with Myra who transferred me to [facility].  Asked that they grant an exception, and cover this med.  Clinical info was provided.  They will expedite the request and contact us as well as the patient once a decision has been made, hopefully within 24 hours.

## 2013-08-25 ENCOUNTER — Telehealth: Payer: Self-pay | Admitting: Neurology

## 2013-08-25 NOTE — Telephone Encounter (Signed)
  Dear [name], GREAT NEWS. I am well, hope you are as well. How is Nina Johnston?  THANK you so very much, CD

## 2013-08-25 NOTE — Telephone Encounter (Signed)
Message copied by Larey Seat on Thu Aug 25, 2013  8:42 AM ------      Message from: Norva Pavlov C      Created: Wed Aug 24, 2013  1:19 PM      Regarding: RE: MEDICARE coverage for seizure medications.        I called ins and said they said her current formulary will not cover Lamictal.  I initiated a prior auth, asking they grant an exception.   Also asked that they expedite the request.  Should have an approval within 24 hours.        Hope you're doing well! =)      Thank you!            ----- Message -----         From: Larey Seat, MD         Sent: 08/24/2013  12:55 PM           To: Norva Pavlov, CPHT      Subject: MEDICARE coverage for seizure medications.                           Please help! Find out why Mrs. Cather was told by Medicare that she needs a pre-authorization  To get meds paid.        ------

## 2013-09-08 NOTE — Telephone Encounter (Signed)
Closing encounter

## 2013-09-21 ENCOUNTER — Ambulatory Visit: Payer: Self-pay | Admitting: Internal Medicine

## 2013-10-03 ENCOUNTER — Ambulatory Visit: Payer: Self-pay | Admitting: Internal Medicine

## 2014-01-10 ENCOUNTER — Telehealth: Payer: Self-pay | Admitting: Neurology

## 2014-01-10 DIAGNOSIS — R270 Ataxia, unspecified: Secondary | ICD-10-CM

## 2014-01-10 DIAGNOSIS — R404 Transient alteration of awareness: Secondary | ICD-10-CM

## 2014-01-10 DIAGNOSIS — R4789 Other speech disturbances: Secondary | ICD-10-CM

## 2014-01-10 MED ORDER — CARBAMAZEPINE ER 100 MG PO TB12: ORAL_TABLET | ORAL | Status: DC

## 2014-01-10 NOTE — Telephone Encounter (Signed)
Patient's mother requesting Rx refill for carbamazepine (TEGRETOL-XR) 100 MG 12 hr tablet.  [facility] stated they faxed and called for refill.  Please call anytime and leave detailed message if not available.

## 2014-01-10 NOTE — Telephone Encounter (Signed)
We have not gotten any requests.  A one year Rx was already sent to the pharmacy in April, however, we have resent the Rx.  I called patent back (Mom is not on EC).  Left message.

## 2014-01-14 ENCOUNTER — Emergency Department: Payer: Self-pay | Admitting: Emergency Medicine

## 2014-01-14 DIAGNOSIS — S060X9A Concussion with loss of consciousness of unspecified duration, initial encounter: Secondary | ICD-10-CM

## 2014-01-14 DIAGNOSIS — S060XAA Concussion with loss of consciousness status unknown, initial encounter: Secondary | ICD-10-CM

## 2014-01-14 HISTORY — DX: Concussion with loss of consciousness status unknown, initial encounter: S06.0XAA

## 2014-01-14 HISTORY — DX: Concussion with loss of consciousness of unspecified duration, initial encounter: S06.0X9A

## 2014-01-17 DIAGNOSIS — G4730 Sleep apnea, unspecified: Secondary | ICD-10-CM | POA: Insufficient documentation

## 2014-02-10 ENCOUNTER — Ambulatory Visit: Payer: Self-pay | Admitting: Internal Medicine

## 2014-03-08 ENCOUNTER — Encounter: Payer: Self-pay | Admitting: Neurology

## 2014-03-08 ENCOUNTER — Ambulatory Visit (INDEPENDENT_AMBULATORY_CARE_PROVIDER_SITE_OTHER): Payer: Medicare Other | Admitting: Neurology

## 2014-03-08 VITALS — BP 152/74 | HR 70 | Temp 98.5°F | Resp 14 | Ht 63.75 in | Wt 212.0 lb

## 2014-03-08 DIAGNOSIS — R404 Transient alteration of awareness: Secondary | ICD-10-CM

## 2014-03-08 DIAGNOSIS — F0781 Postconcussional syndrome: Secondary | ICD-10-CM

## 2014-03-08 DIAGNOSIS — E1310 Other specified diabetes mellitus with ketoacidosis without coma: Secondary | ICD-10-CM

## 2014-03-08 DIAGNOSIS — E1110 Type 2 diabetes mellitus with ketoacidosis without coma: Secondary | ICD-10-CM

## 2014-03-08 DIAGNOSIS — R270 Ataxia, unspecified: Secondary | ICD-10-CM

## 2014-03-08 DIAGNOSIS — N39498 Other specified urinary incontinence: Secondary | ICD-10-CM | POA: Insufficient documentation

## 2014-03-08 DIAGNOSIS — R4789 Other speech disturbances: Secondary | ICD-10-CM

## 2014-03-08 DIAGNOSIS — N3945 Continuous leakage: Secondary | ICD-10-CM

## 2014-03-08 HISTORY — DX: Continuous leakage: N39.45

## 2014-03-08 HISTORY — DX: Postconcussional syndrome: F07.81

## 2014-03-08 MED ORDER — CARBAMAZEPINE ER 100 MG PO TB12: ORAL_TABLET | ORAL | Status: DC

## 2014-03-08 MED ORDER — LAMOTRIGINE 25 MG PO TABS: 25.0000 mg | ORAL_TABLET | Freq: Every day | ORAL | Status: DC

## 2014-03-08 MED ORDER — CLONAZEPAM 1 MG PO TABS: ORAL_TABLET | ORAL | Status: DC

## 2014-03-08 MED ORDER — LAMOTRIGINE 200 MG PO TABS: 300.0000 mg | ORAL_TABLET | Freq: Two times a day (BID) | ORAL | Status: DC

## 2014-03-08 MED ORDER — ETHOSUXIMIDE 250 MG/5ML PO SOLN: 250.0000 mg | Freq: Two times a day (BID) | ORAL | Status: DC

## 2014-03-08 NOTE — Progress Notes (Signed)
[facility]   Provider:  Dr Wilda Wetherell Referring Provider: / Primary Care Physician:  Idelle Crouch, MD  Recent HA- chief complaint.   HPI:  Nina Johnston is a 53 y.o. female here for a RV :  Routine Rv for refills. Patient is in the process for a renal transplant at [facility]. Med Record number :  [id number]  Nina Johnston renal function has declined over the last 3-1/2 years. She is not considered end chronic renal disease stage III. She developed anemia.  Her diabetes is better controlled and her HTN is the main factor in her ESRD. She goes twice at night to the bathroom.  She doesn't report diaphoresis, no falls and no dizziness. She had a spell of frequent headaches.   Postconcussion syndrome , fall from September 12 th 2015, hist her head on a cement floor. Treated al  [facility] ED, CT head normal. EKG normal, Echo normal. .  Note contact at [facility]; transplant. Dr . Talmadge Coventry,  Nina Johnston [id number].     This  53 year old, Caucasian  right-handed , single female has been followed in this practice since 2009.  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  She had such spells in August 2010 and September 2011 after carbamazepine was changed to a generic form of Carbatrol her blood levels were lower,  she also developed upper respiratory tract infections and pneumonia in 2011 and was unable for a while to use her CPAP which had been initiated in [place] for the treatment of obstructive sleep apnea. Paroxysmal events with mental status changes had continued,  03/17/2011  and 20 13 some of the spells are described as "  automatisms"- continuing behavior while  the patient is staring off and acting without  reflecting not being  responsive to stimulation from the outside . After the spell passes, she became very sleepy each time.   In August 2012 she was finally referred to an endocrinologist at [facility] after she had a hypoglycemic seizure and  Her diabetes medications were reduced, now her blood sugars are running between 150 and 200 in the morning fasting. Nina Johnston begun working out in 2030 with a Physiological scientist and was able to lose about 30 pounds . She returned to using her CPAP regularly her Hb A1 C. in October of last year was 6.1-  she had no other hypoglycemic events reported  Since March 2013 .  Also in 20 13 she had to visit the local ER twice  for abdominal spasms and cardiac / chest pain.  She has been followed for a decreased renal clearance by internal medicine and nephrology.  Patient reports a fall in June 2014 ,  triggered by a uneven surface (The curb of a sidewalk)- she sprained her right ankle in another fall May 25 th, and bruised her left knee in a fall in  April. The patient underwent a colonoscopy on May 6 which revealed diverticulosis but no malignancies or other significant findings. 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 The patient is currently treated for her spells as Lamictal, zZonisamide , and remains on Tegretol.  It she will need blood levels for her anti-epiletic  medications. Dr. Geryl Councilman is debating to reduce her Klonopin , which may help reduce her fall risk as well. Vit D deficiency. Treated by supplement .     Review of Systems: Out of a complete 14 system review, the patient complains of only the following symptoms:   other reviewed systems are negative. High fall risk 10 points, none by age , 5 from medications.    History   Social History  . Marital Status: Single    Spouse Name: N/A     Number of Children: 0  . Years of Education:  college   Occupational History  . not employed    Social History Main Topics  . Smoking status: Never Smoker   . Smokeless tobacco: Never Used  . Alcohol Use: No  . Drug Use: No  . Sexual Activity: Not on file   Other Topics Concern  . Not on file   Social History Narrative   Patient is single and lives with her parents.   Patient is disabled.   Patient has a college education.   Patient is right- handed.   Patient drinks tea occasionally.    Family History  Problem Relation Age of Onset  . Diabetes Mother   . Lumbar disc disease Mother   . Migraines Mother   . Cancer - Prostate Father     mild    Past Medical History  Diagnosis Date  . Morbid obesity   . Hypertension   . Family history of thyroid problem   . Anxiety and depression   . Diabetes mellitus without complication   . Seizures   . Heart disease     enlarged because of COPD  . Renal disease     w/ GFR 29-may be due to diabetes  . Bronchitis   . Asthma   . 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 Transient alteration of awareness 11/10/2012  . Concussion 01-14-14    Fall     Past Surgical History  Procedure Laterality Date  . Cholecystectomy    . Endometrial biopsy      ablation, uterine    Current Outpatient Prescriptions  Medication Sig Dispense Refill  . amLODipine (NORVASC) 5 MG tablet Take 5 mg by mouth daily.    Marland Kitchen aspirin 81 MG tablet Take 81 mg by mouth at bedtime as needed for pain.    . carbamazepine (TEGRETOL-XR) 100 MG 12 hr tablet Two tablets in the morning and one tablet in the evening. 270 tablet 3  . clonazePAM (KLONOPIN) 1 MG tablet 1 mg  at bedtime 30 tablet 5  . cyanocobalamin (,VITAMIN B-12,) 1000 MCG/ML injection Inject 1,000 mcg into the muscle every 30 (thirty) days.    Nina Johnston Calcium (STOOL SOFTENER PO) Take by  mouth 2 (two) times daily. 2 regular in am , 1 with stimulant in pm    . ethosuximide (ZARONTIN) 250 MG/5ML solution Take 5 mLs (250 mg total) by mouth 2 (two) times daily. 474 mL 3  . Febuxostat (ULORIC) 80 MG TABS Take by mouth daily. Morning dosage    . FERROUS SULFATE PO Take 325 mg by mouth. 2 tabs in am and 1 tab in pm    . fexofenadine (ALLEGRA) 180 MG tablet Take 180 mg by mouth daily. am    .  Fluticasone-Salmeterol (ADVAIR DISKUS) 250-50 MCG/DOSE AEPB Inhale 1 puff into the lungs 2 (two) times daily.    . furosemide (LASIX) 20 MG tablet Take 20 mg by mouth. Am dosage    . gemfibrozil (LOPID) 600 MG tablet Take 600 mg by mouth 2 (two) times daily before a meal.    . glipiZIDE (GLUCOTROL) 5 MG tablet Take 10 mg by mouth 2 (two) times daily before a meal.     . hydrALAZINE (APRESOLINE) 50 MG tablet Take 50 mg by mouth 2 (two) times daily.     Marland Kitchen lamoTRIgine (LAMICTAL) 200 MG tablet Take 1.5 tablets (300 mg total) by mouth 2 (two) times daily. Brand Medically Necessary 270 tablet 3  . levothyroxine (SYNTHROID) 150 MCG tablet Take 150 mcg by mouth daily. Am dosage    . magnesium oxide (MAG-OX) 400 MG tablet Take 400 mg by mouth daily. am    . metoprolol succinate (TOPROL XL) 50 MG 24 hr tablet Take 50 mg by mouth 2 (two) times daily. Take with or immediately following a meal.    . montelukast (SINGULAIR) 10 MG tablet Take 10 mg by mouth at bedtime.    Marland Kitchen omeprazole (PRILOSEC) 20 MG capsule Take 20 mg by mouth 2 (two) times daily.    . paricalcitol (ZEMPLAR) 1 MCG capsule Take 1 mcg by mouth daily. 2 tabs in am    . polyethylene glycol (MIRALAX) packet Take 17 g by mouth at bedtime. 17 mg, Am dosage, 1/2 dose qhs    . sevelamer (RENAGEL) 800 MG tablet Take 1,600 mg by mouth 3 (three) times daily with meals.     . cefdinir (OMNICEF) 300 MG capsule Take 300 mg by mouth daily. When needed.    . GuaiFENesin (MUCINEX PO) Take by mouth. Expectorant, as needed    . lamoTRIgine (LAMICTAL) 25 MG tablet  Take 1 tablet (25 mg total) by mouth daily. 240 tablet 3  . predniSONE (DELTASONE) 10 MG tablet Take 10 mg by mouth daily. 6 day decreasing number     No current facility-administered medications for this visit.    Allergies as of 03/08/2014 - Review Complete 03/08/2014  Allergen Reaction Noted  . Cinnamon  11/09/2012  . Procrit [epoetin alfa]  11/10/2012    Vitals: BP 152/74 mmHg  Pulse 70  Temp(Src) 98.5 F (36.9 C) (Oral)  Resp 14  Ht 5' 3.75" (1.619 m)  Wt 212 lb (96.163 kg)  BMI 36.69 kg/m2 Last Weight:  Wt Readings from Last 1 Encounters:  03/08/14 212 lb (96.163 kg)   Last Height:   Ht Readings from Last 1 Encounters:  03/08/14 5' 3.75" (1.619 m)   Vision Screening:   Physical exam:  General: The patient is awake, alert and appears not in acute distress. The patient is well groomed. Head: Normocephalic, atraumatic. Neck is supple. Mallampati 1 - there is no uvula , neck circumference:15.5 inches.   Low potassium diet. Lost 35 pounds  Cardiovascular:  Regular rate and rhythm, without  murmurs or carotid bruit, and without distended neck veins. Respiratory: Lungs are clear to auscultation. Skin: ankle  edema, no rash. Looking jaundiced.  Trunk: BMI is morbidly obese-  patient has normal posture.  Neurologic exam : The patient is awake and alert, oriented to place and time.  Memory subjective described as intact. There is a normal attention span & concentration ability.  Speech is fluent without dysarthria or aphasia. Mood and affect are appropriate.  Cranial nerves: Pupils are equal and briskly reactive  to light. Funduscopic exam without evidence of pallor or edema.  Extraocular movements  in vertical and horizontal planes intact and without nystagmus. Visual fields by finger perimetry are intact. Hearing to finger rub intact.  Facial sensation intact to fine touch. Facial motor strength is symmetric and tongue moves midline.  Motor exam:   Normal tone and  normal muscle bulk, obesity related attenuated DTR , and symmetric normal strength in all extremities.  Sensory:  Fine touch, pinprick and vibration were tested in all extremities and are presents  in toes and feet.  Proprioception is tested and normal.  Coordination: Rapid alternating movements in the fingers/hands is tested and normal.  Finger-to-nose maneuver tested and normal without evidence of ataxia, dysmetria or tremor.  Gait and station: Patient walks without assistive device and is very slow -  Appears truncally rigid , with no lumbar rotation, turns with 5 steps, and has reduced step width. Strength within normal limits. Stance is stable and normal. Tandem gait is fragmented. Romberg testing is abnormal. Needed to brace herself , help to get up from seated position.   Deep tendon reflexes: in the  upper and lower extremities are symmetric and intact. Babinski maneuver response is down going on the left and equivocal on the right.   Assessment:  After physical and neurologic examination, review of laboratory studies, imaging, neurophysiology testing and pre-existing records, assessment is:   There is no clear evidence of neuropathy in this patient with diabetes , who has  preserved vibratory sense in both ankles.  However ataxia on tandem gait is evident; this could well be related to long term Tegretol. She denies any diplopia. She has no nystagmus. She has renal failure.  There was no impairment of the upper extremity finger-nose maneuver.   Plan:    Treatment plan and additional workup will be reviewed under Problem List. Fall on her 34th HS reunion,  September 12 th.  ( no alcohol - low BS ) fell and hit her head- she did not pass out, but had severe vertigo.  Now she is very very sleepy.   Her diabetes is better controlled at this point - she has not had hypoglycemic events which has had for the past sometimes look like convulsive seizures . Her HTN is improving. She takes 1 mg  klonopin at bed time.  Her renal function is declining, she has not yet had a fistula placed, she is evaluated for a possible donor organ. Her next nephrology appoitnment is in late November 2015.   Her spells have not occurred for over 9 month , no seizure-  But a concussion.   Refills :     Rv every 6 month alternating between  NP and me.

## 2014-03-09 LAB — CBC WITH DIFFERENTIAL/PLATELET
Basophils Absolute: 0.1 10*3/uL (ref 0.0–0.2)
Basos: 1 %
Eos: 1 %
Eosinophils Absolute: 0.1 10*3/uL (ref 0.0–0.4)
HCT: 26.7 % — ABNORMAL LOW (ref 34.0–46.6)
Hemoglobin: 9 g/dL — ABNORMAL LOW (ref 11.1–15.9)
Immature Grans (Abs): 0.2 10*3/uL — ABNORMAL HIGH (ref 0.0–0.1)
Immature Granulocytes: 2 %
Lymphocytes Absolute: 1.8 10*3/uL (ref 0.7–3.1)
Lymphs: 18 %
MCH: 33.7 pg — ABNORMAL HIGH (ref 26.6–33.0)
MCHC: 33.7 g/dL (ref 31.5–35.7)
MCV: 100 fL — ABNORMAL HIGH (ref 79–97)
Monocytes Absolute: 0.7 10*3/uL (ref 0.1–0.9)
Monocytes: 7 %
Neutrophils Absolute: 7.4 10*3/uL — ABNORMAL HIGH (ref 1.4–7.0)
Neutrophils Relative %: 71 %
RBC: 2.67 x10E6/uL — CL (ref 3.77–5.28)
RDW: 14.3 % (ref 12.3–15.4)
WBC: 10.2 10*3/uL (ref 3.4–10.8)

## 2014-03-09 LAB — COMPREHENSIVE METABOLIC PANEL
ALT: 8 IU/L (ref 0–32)
AST: 10 IU/L (ref 0–40)
Albumin/Globulin Ratio: 1.8 (ref 1.1–2.5)
Albumin: 4.4 g/dL (ref 3.5–5.5)
Alkaline Phosphatase: 337 IU/L — ABNORMAL HIGH (ref 39–117)
BUN/Creatinine Ratio: 20 (ref 9–23)
BUN: 82 mg/dL (ref 6–24)
CO2: 19 mmol/L (ref 18–29)
Calcium: 9.1 mg/dL (ref 8.7–10.2)
Chloride: 101 mmol/L (ref 97–108)
Creatinine, Ser: 4.11 mg/dL — ABNORMAL HIGH (ref 0.57–1.00)
GFR calc Af Amer: 13 mL/min/{1.73_m2} — ABNORMAL LOW (ref >59)
GFR calc non Af Amer: 12 mL/min/{1.73_m2} — ABNORMAL LOW (ref >59)
Globulin, Total: 2.4 g/dL (ref 1.5–4.5)
Glucose: 166 mg/dL — ABNORMAL HIGH (ref 65–99)
Potassium: 5.1 mmol/L (ref 3.5–5.2)
Sodium: 142 mmol/L (ref 134–144)
Total Bilirubin: <0.2 mg/dL (ref 0.0–1.2)
Total Protein: 6.8 g/dL (ref 6.0–8.5)

## 2014-03-16 DIAGNOSIS — D638 Anemia in other chronic diseases classified elsewhere: Secondary | ICD-10-CM | POA: Insufficient documentation

## 2014-03-16 NOTE — Progress Notes (Signed)
Quick Note:  Results of labs given to mother of pt. Pt has appt with nephrologist today at 1500. I faxed to his office [phone], [phone] Dr. Naida Sleight. Successful transmission. Mother given the abnormal resulst as well. ______

## 2014-04-03 ENCOUNTER — Telehealth: Payer: Self-pay | Admitting: Neurology

## 2014-04-03 NOTE — Telephone Encounter (Signed)
I called again.  Spoke with Rich.  He verified the Rx was sent correctly and said nothing further is needed at this time.  They will notify patient when Rx is ready for pick up.

## 2014-04-03 NOTE — Telephone Encounter (Signed)
Pt needs a corrected Rx sent in to [facility] for Brand Name lamictal (Glaxo) 200mg  take 300mg  bid. The number for [facility] is [phone].  If you have questions please call.  The wrong Rx was sent in.

## 2014-04-03 NOTE — Telephone Encounter (Signed)
Rx was sent for 200mg  tabs 1.5 tabs (300mg ) twice daily BMN.  I called the pharmacy four times in a row.  Got no answer.

## 2014-04-18 ENCOUNTER — Telehealth: Payer: Self-pay | Admitting: Neurology

## 2014-04-18 DIAGNOSIS — R4789 Other speech disturbances: Secondary | ICD-10-CM

## 2014-04-18 DIAGNOSIS — R404 Transient alteration of awareness: Secondary | ICD-10-CM

## 2014-04-18 DIAGNOSIS — R270 Ataxia, unspecified: Secondary | ICD-10-CM

## 2014-04-18 MED ORDER — CARBAMAZEPINE ER 100 MG PO TB12: ORAL_TABLET | ORAL | Status: DC

## 2014-04-18 NOTE — Telephone Encounter (Signed)
Patient requested Rx refill for carbamazepine (TEGRETOL-XR) 100 MG 12 hr tablet forward to [facility] due to no refills indicated.  Please call and advise.

## 2014-04-18 NOTE — Telephone Encounter (Signed)
Rx has been sent.  I called back. Got no answer.  Left message.

## 2014-04-19 DIAGNOSIS — B9689 Other specified bacterial agents as the cause of diseases classified elsewhere: Secondary | ICD-10-CM | POA: Insufficient documentation

## 2014-04-19 DIAGNOSIS — J019 Acute sinusitis, unspecified: Secondary | ICD-10-CM

## 2014-04-19 HISTORY — DX: Other specified bacterial agents as the cause of diseases classified elsewhere: B96.89

## 2014-06-14 ENCOUNTER — Encounter: Payer: Self-pay | Admitting: Urology

## 2014-07-04 ENCOUNTER — Encounter
Admit: 2014-07-04 | Disposition: A | Discharge status: Home / Self Care | Payer: Self-pay | Attending: Urology | Admitting: Urology

## 2014-08-04 ENCOUNTER — Encounter
Admit: 2014-08-04 | Disposition: A | Discharge status: Home / Self Care | Payer: Self-pay | Attending: Urology | Admitting: Urology

## 2014-08-27 NOTE — Consult Note (Signed)
PATIENT NAME:  Nina Johnston, Nina Johnston MR#:  [id number] DATE OF BIRTH:  December 03, 1960  DATE OF CONSULTATION:  07/04/2011  REFERRING PHYSICIAN:  Eula Listen, MD CONSULTING PHYSICIAN:  Theodoro Grist, MD  REASON FOR CONSULTATION: The patient was admitted to the Emergency Room on 07/04/2011, and consult was requested by Dr. Mariea Clonts in regards to possible admission for patient with chest pains.   HISTORY OF PRESENT ILLNESS: The patient is a  54 year old Caucasian female with past medical history significant for history of anemia, diabetes, hypertension, hyperlipidemia, obesity, as well as bipolar disorder, who presented to the hospital with complaints of chest pains. According to the patient, she was doing well up until approximately 5:30 a.m. on day of admission when she started having midsternal radiating down to her abdomen pain. It also was radiating to her back, neck, as well as right arm, and felt as if an elephant was sitting on the chest. The pain was increasing whenever she breathing or coughing. She was also complaining of some right hip area pains, groin area hip area pain. She had very similar chest pains before, however, not as bad as it has been in the past. Her last stress test was in September 2011 which was completely within normal limits. Her echocardiogram was also done in September 2011 which showed ejection fraction of 50%, mild left atrial enlargement as well as moderate MR and TR. She came to the Emergency Room, and she underwent CT scanning of her chest which was unremarkable for pulmonary embolism, and Hospitalist Services were contacted for consultation.   PAST MEDICAL HISTORY: Past medical history is significant for:  1. History of obstructive sleep apnea for which she uses a CPAP.  2. History of seizure disorder. 3. History of reactive airway disease. 4. History of chronic anemia. 5. Low vitamin B12 since 2004-on replacement therapy. 6. Borderline low iron saturations.   7. History of hypothyroidism.  8. Diabetes mellitus, type 2. 9. Microalbuminuria.  10. History of hypertension. 11. Obesity. 12. Bipolar disorder.  13. Gastroesophageal reflux disease. 14. Venous stasis with peripheral edema. 15. Fibrocystic breast disease. 16. Peripheral neuropathy. 17. Allergies. 18. Kidney stones. 19. Positive FANA. 20. 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 21. History of osteoarthritis. 22. Degenerative disk disease. 23. Cardiomegaly. 24. Chronic obstructive pulmonary disease. 25. Renal insufficiency. 26. Gout.   PAST SURGICAL HISTORY:  1. Status post cholecystectomy.  2. uterine  ablation.   MEDICATIONS: According to medical records, Dr. Doy Hutching' note, the patient is on:  1. Actos 30 mg p.o. daily.  2. Advair Diskus 250/50 twice daily.  3. Allegra 180 mg p.o. daily.  4. Aspirin 81 mg p.o. daily.  5. Vitamin B12 solution 1000 mcg intramuscularly monthly.  6. CPAP nightly.  7. Ferrous sulfate 325 mg p.o. twice daily.  8. Hydralazine 50 mg p.o. twice daily.  9. Klonopin 0.5 mg at bedtime.  10. Lasix 20 mg p.o. daily.  11. Magnesium tablets 500 mg p.o. daily.  12. Norvasc 10 mg p.o. daily.  13. Prilosec over-the-counter 20 mg p.o. daily.  14. Singulair 10 mg p.o. daily.  15. Synthroid 150 mcg p.o. daily.  16. Lamictal 200 mg, 1/2 tablet twice daily.  17. Glimepiride 5 mg p.o. daily.  27. FiberCon, which was stopped recently.  19. Toprol-XL 50 mg p.o. twice daily.  20. Lopid 600 mg p.o. twice daily.  21. Vitamin D3 1000 units, 1 capsule at bedtime.  22. Citracal Plus vitamin D 315/200 mg/unit, 1 tablet daily.  23. Vitamin  C 500 mg p.o. daily.  24. Avapro 300 mg p.o. daily.  25. Diflucan 150 mg p.o. daily.   ALLERGIES: ACE inhibitor, which gives her cough, Neosporin.    SOCIAL HISTORY: Negative for alcohol or tobacco abuse.   FAMILY HISTORY: Coronary artery disease, cerebrovascular disease as well as diabetes  and hypertension.   REVIEW OF SYSTEMS: Positive for fatigue and weakness which seem to be chronic. She stated that she woke up, was freezing cold. She also lost approximately 20 pounds since 05/02/2011. She had problems with her eyes the last two nights. She said that she blinked like crazy, and she was given Ativan by her father, which improved her blinking. She also has sneezing and wheezing in her lungs. She also is whistling whenever she breathes for a couple of nights now. She had some chest pains, as well as dyspnea on exertion, as well as abdominal pains. However, her chest pains almost resolved after she had a bowel movement here in the Emergency Room. She has been having constipation. Her last bowel movement before having a bowel movement in the Emergency Room was last Wednesday. She attributes this problem with constipation due to [place] being stopped. Also has intermittent dysuria, generalized soreness of her muscles and achiness of her joints. CONSTITUTIONAL: Otherwise, she denies any weight gain. EYES: In regards to eyes, denies any blurry vision, double vision, glaucoma, or cataracts. ENT: Denies any tinnitus, allergies, epistaxis, sinus pain, dentures, difficulty swallowing. RESPIRATORY: Denies any cough, hemoptysis, significant dyspnea, painful respirations. CARDIOVASCULAR: Denies orthopnea, edema, arrhythmias, palpitations or syncope. GASTROINTESTINAL: Denies any nausea, vomiting, diarrhea, hematemesis, rectal bleeding, change in bowel habits. GENITOURINARY: Denies hematuria, frequency, or incontinence. ENDOCRINOLOGY: Denies any polydipsia, nocturia, thyroid problems, heat or cold intolerance or thirst. HEMATOLOGIC: Denies anemia, easy bruising, bleeding, or swollen glands. SKIN: Denies any acne, rashes, lesions, or change in moles. MUSCULOSKELETAL: Denies arthritis, cramps, swelling, or gout. NEUROLOGIC: Denies numbness, epilepsy, or tremor. PSYCHIATRIC: Denies anxiety, insomnia, or depression.        PHYSICAL EXAMINATION:  VITAL SIGNS: On arrival to the hospital, temperature was 98.7, pulse 75, respiration rate 18, blood pressure 166/79, saturation 99% on room air.   GENERAL: The patient is an obese Caucasian female lying sideways on the stretcher.    EXTREMITIES: No lower extremity edema. No calf tenderness or cyanosis was noted.   ABDOMEN: Soft and nontender. Bowel sounds are present. No hepatomegaly or masses were noted.   RECTAL: Deferred.   MUSCULOSKELETAL: Muscle strength: Able to move all extremities. No cyanosis, degenerative joint disease, or kyphosis. Gait is not tested.   SKIN: Skin did not reveal any rashes, lesions, erythema, nodularity, or induration. It was warm and dry to palpation.   LYMPH: No adenopathy in the cervical region.   NEUROLOGIC: Cranial nerves are grossly intact. Sensory is intact. No dysarthria or aphasia.   PSYCHIATRIC: The patient is alert, oriented to time, person and place, cooperative. Memory is good. No significant confusion, agitation, or depression noted.   LABORATORY, DIAGNOSTIC AND RADIOLOGICAL DATA:  BMP showed a glucose of 198, BUN and creatinine were 65 and 1.95 comparatively to 93 and 2.5 on 02/172013. Bicarbonate level was slightly low at 20, otherwise unremarkable BMP.  The patient's liver enzymes were remarkable for alkaline phosphatase elevated to 188, however, that is not new as the patient's alkaline phosphatase was elevated to 216 on 12/17/2010.  The patient's cardiac enzymes, first set negative.  CBC: White blood cell count 8.6, hemoglobin 9.1, platelets 200. Coagulation panel is remarkable  for activated PTT elevated to 36.8.  Urinalysis: Clear straw urine, negative for glucose, bilirubin or ketones, specific gravity 1.008, pH 5.0, negative for blood, 30 mg/dL protein, negative for nitrites or leukocyte esterase, 1 red blood cell as well as 1 white blood cell, 1+ bacteria.  EKG showed normal sinus rhythm at 73 beats per  minute, normal axis, nonspecific intraventricular conduction delay with QRS duration of 122 and PR interval of 204. No   significant change since then 06/22/2011 and no acute ST-T changes were noted. Chest x-ray, portable single view on 07/04/2011: The lung fields are clear. Cardiomegaly was noted.  CT scan of the chest without contrast 07/04/2011: Shallow aspiration with some respiratory motion artifact. Cardiomegaly without evidence of focal infiltrate or mass. No pleural or pericardial effusion was evident. Unfortunately, the CT of the chest was without contrast, so no pulmonary embolism was actually looked for and was just evaluated for possible pneumonia. However, no changes were found.   ASSESSMENT AND PLAN:  1. Atypical chest pain: The patient's pain seems to be related to her abdominal discomfort and very likely is musculoskeletal. We will repeat the patient's troponin one more time; and if this is normal, we will likely discharge her home. She is to follow up with a cardiologist. I have already discussed the case with Dr. Ubaldo Glassing, and he agreed that the patient would benefit from Cardiology evaluation, which is going to be done by Dr. Nehemiah Massed on 07/08/2011 at 11:00 a.m. in his office.  2. Hypertension: The patient is to continue her outpatient medications.  3. Hyperlipidemia: Continue outpatient medications.  4. Constipation: A prescription for MiraLax will be given for her to take home.  5. History of diabetes mellitus: Continue outpatient medications.  6. History of renal insufficiency: Seems to be stable.   TIME SPENT: One hour.  ____________________________ Theodoro Grist, MD rv:cbb D: 07/04/2011 10:57:42 ET T: 07/04/2011 11:48:37 ET JOB#: [id number]  cc: Theodoro Grist, MD, <Dictator> Jasmine Maceachern MD ELECTRONICALLY SIGNED 07/17/2011 17:52

## 2014-08-29 ENCOUNTER — Telehealth: Payer: Self-pay | Admitting: Neurology

## 2014-08-29 NOTE — Telephone Encounter (Signed)
Mary, pt's mother called requesting a refill for lamoTRIgine (LAMICTAL) 200 MG tablet. Pharmacy: [facility] # [phone]. Stanton Kidney can be reached @ [phone]

## 2014-08-29 NOTE — Telephone Encounter (Signed)
It appears a one year Rx was sent to Nina Johnston in Nov.  I called the pharmacy.  Spoke with Rich.  He verified there are several refills on file.  Says they are getting a rejection from ins.  I contacted ins and provided clinical info.  They are reviewing coverage for this drug Ref Key: [id number].  I called patient back, got no answer.

## 2014-08-31 NOTE — Telephone Encounter (Signed)
[employer] [place] has approved the request for coverage on Lamictal (Brand Name) effective until 08/29/2015 Ref # [id number], Ref ID # [id number]

## 2014-09-06 ENCOUNTER — Ambulatory Visit (INDEPENDENT_AMBULATORY_CARE_PROVIDER_SITE_OTHER): Payer: Medicare Other | Admitting: Neurology

## 2014-09-06 ENCOUNTER — Encounter: Payer: Self-pay | Admitting: Neurology

## 2014-09-06 VITALS — BP 138/70 | HR 64 | Resp 18 | Ht 64.57 in | Wt 211.0 lb

## 2014-09-06 DIAGNOSIS — R404 Transient alteration of awareness: Secondary | ICD-10-CM

## 2014-09-06 DIAGNOSIS — R270 Ataxia, unspecified: Secondary | ICD-10-CM

## 2014-09-06 DIAGNOSIS — R4789 Other speech disturbances: Secondary | ICD-10-CM | POA: Diagnosis not present

## 2014-09-06 MED ORDER — CLONAZEPAM 1 MG PO TABS: ORAL_TABLET | ORAL | Status: DC

## 2014-09-06 MED ORDER — ETHOSUXIMIDE 250 MG/5ML PO SOLN: 250.0000 mg | Freq: Two times a day (BID) | ORAL | Status: DC

## 2014-09-06 MED ORDER — LAMOTRIGINE 25 MG PO TABS: 25.0000 mg | ORAL_TABLET | Freq: Every day | ORAL | Status: DC

## 2014-09-06 MED ORDER — CARBAMAZEPINE ER 100 MG PO TB12: ORAL_TABLET | ORAL | Status: DC

## 2014-09-06 MED ORDER — LAMOTRIGINE 200 MG PO TABS: 300.0000 mg | ORAL_TABLET | Freq: Two times a day (BID) | ORAL | Status: DC

## 2014-09-06 NOTE — Progress Notes (Signed)
[facility]   Provider:  Dr Taela Charbonneau Referring Provider: / Primary Care Physician:  Idelle Crouch, MD  Recent HA- chief complaint.   HPI:  Nina Johnston is a 54 y.o. female here for a RV :  Routine Rv for refills. Patient is in the process for a renal transplant at [facility]. Med Record number :  [id number]  Nina Johnston renal function has declined over the last 3-1/2 years. She is not considered end chronic renal disease stage III. She developed anemia.  Her diabetes is better controlled and her HTN is the main factor in her ESRD. She goes twice at night to the bathroom.  She doesn't report diaphoresis, no falls and no dizziness. She had a spell of frequent headaches.   Postconcussion syndrome , fall from September 12 th 2015, hist her head on a cement floor. Treated al  [facility], CT head normal. EKG normal, Echo normal. .  Note contact at [facility]; transplant. Dr . Talmadge Coventry,  Katheran Awe - [place] [id number].   This  54 year old, Caucasian  right-handed , single female has been followed in this practice since 2009.  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  She had such spells in August 2010 and September 2011 after carbamazepine was changed to a generic form of Carbatrol her blood levels were lower,  she also developed upper respiratory tract infections and pneumonia in 2011 and was unable for a while to use her CPAP which had been initiated in [place] for the treatment of obstructive sleep apnea. Paroxysmal events with mental status changes had continued,  03/17/2011  and 20 13 some of the spells are described as "  automatisms"- continuing behavior while  the patient is staring off and acting without  reflecting not being  responsive to stimulation from the outside . After the spell passes, she became very sleepy each time.   In August 2012 she was finally referred to an endocrinologist at [facility] after she had a hypoglycemic seizure and  Her diabetes medications were reduced, now her blood sugars are running between 150 and 200 in the morning fasting. [name] begun working out in 2030 with a Physiological scientist and was able to lose about 30 pounds . She returned to using her CPAP.  her Hb A1 C. in October of last year was 6.1-  she had no other hypoglycemic events reported since March 2013 .  Also in 20 13 she had to visit the local ER twice  for abdominal spasms and cardiac / chest pain.  She has been followed for a decreased renal clearance by internal medicine and nephrology.  Patient reports a fall in June 2014 ,  triggered by a uneven surface (The curb of a sidewalk)- she sprained her right ankle in another fall May 25 th, and bruised her left knee in a fall in  April. The patient underwent a colonoscopy on May 6 which revealed diverticulosis but no malignancies or other significant findings. 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 The patient is currently treated for her spells as Lamictal, zZonisamide , and remains on Tegretol.  It she  will need blood levels for her anti-epiletic  medications. Dr. Geryl Councilman is debating to reduce her Klonopin , which may help reduce her fall risk as well. Vit D deficiency. Treated by supplement .    Revisit from 09-06-14 [name] has been on a renal diet and has lost 45 pounds. She was finally evaluated and a AV fistula placed earlier this year. She has lost little bit of grip strength in the left hand but otherwise is doing well. Looking at her [facility] helps laboratory results creatinine was 4.6 calcium  was 8.6 phosphorus was 5.6. Albumin was normal. Hematocrit was 7.9 she does have hyperparathyroidism at 202 her thyroid hormone units. Iron ferritin were normal range. He continues to take phosphate binding medication 3 times a day. Also looked at her red blood cell count today is 2.67 as of November last year. Her creatinine was 4.11 as of November last year when Tested in our lab at [place] her medication list. She states on the medical Toprol and Tegretol. She still on ethosuximide. The patient just resumed driving last year and for this reason I will leave her on 3 antiepileptic drugs also clinically I would be leaning towards weaning her off ethosuximide. It may be best to have either the transplant or the hemodialysis started and the metabolic panel stabilized before changing. I will today refill her Klonopin, ethosuximide, Tegretol and Lamictal   Review of Systems: Out of a complete 14 system review, the patient complains of only the following symptoms:   other reviewed systems are negative. High fall risk 10 points, none by age , 5 from medications.    History   Social History  . Marital Status: Single    Spouse Name: N/A  . Number of Children: 0  . Years of Education:  college   Occupational History  . not employed    Social History Main Topics  . Smoking status: Never Smoker   . Smokeless tobacco: Never Used  . Alcohol Use: No  . Drug Use: No  . Sexual Activity: Not on file   Other Topics Concern  . Not on file   Social History Narrative   Patient is single and lives with her parents.   Patient is disabled.   Patient has a college education.   Patient is right- handed.   Patient drinks tea occasionally. 2 glasses of tea when they eat out.    Family History  Problem Relation Age of Onset  . Diabetes Mother   . Lumbar disc disease Mother   . Migraines Mother   . Cancer - Prostate Father     mild    Past Medical History  Diagnosis Date  . Morbid  obesity   . Hypertension   . Family history of thyroid problem   . Anxiety and depression   . Diabetes mellitus without complication   . Seizures   . Heart disease     enlarged because of COPD  . Renal disease     w/ GFR 29-may be due to diabetes  . Bronchitis   . Asthma   . 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 Transient alteration of awareness 11/10/2012  . Concussion 01-14-14    Fall   . Urinary incontinence with continuous leakage 03/08/2014  . Post-concussion syndrome 03/08/2014    Past Surgical History  Procedure Laterality Date  .  Cholecystectomy    . Endometrial biopsy      ablation, uterine  . Avf      Current Outpatient Prescriptions  Medication Sig Dispense Refill  . amLODipine (NORVASC) 5 MG tablet Take 5 mg by mouth daily.    Marland Kitchen aspirin 81 MG tablet Take 81 mg by mouth at bedtime as needed for pain.    . carbamazepine (TEGRETOL-XR) 100 MG 12 hr tablet Two tablets in the morning and one tablet in the evening. 270 tablet 3  . cefdinir (OMNICEF) 300 MG capsule Take 300 mg by mouth daily. When needed.    . clonazePAM (KLONOPIN) 1 MG tablet 1 mg  at bedtime 30 tablet 5  . cyanocobalamin (,VITAMIN B-12,) 1000 MCG/ML injection Inject 1,000 mcg into the muscle every 30 (thirty) days.    Nina Johnston Calcium (STOOL SOFTENER PO) Take by mouth 2 (two) times daily. 2 regular in am , 1 with stimulant in pm    . ethosuximide (ZARONTIN) 250 MG/5ML solution Take 5 mLs (250 mg total) by mouth 2 (two) times daily. 474 mL 3  . Febuxostat (ULORIC) 80 MG TABS Take by mouth daily. Morning dosage    . FERROUS SULFATE PO Take 325 mg by mouth. 2 tabs in am and 1 tab in pm    . fexofenadine (ALLEGRA) 180 MG tablet Take 180 mg by mouth daily. am    . Fluticasone-Salmeterol (ADVAIR DISKUS) 250-50 MCG/DOSE AEPB Inhale 1 puff into the lungs 2 (two) times daily.    . furosemide (LASIX) 20  MG tablet Take 20 mg by mouth. Am dosage    . furosemide (LASIX) 40 MG tablet Take 40 mg in am and 20 mg (1/2 a pill) around 4-5 pm.    . gemfibrozil (LOPID) 600 MG tablet Take 600 mg by mouth 2 (two) times daily before a meal.    . glipiZIDE (GLUCOTROL) 5 MG tablet Take 10 mg by mouth 2 (two) times daily before a meal.     . GuaiFENesin (MUCINEX PO) Take by mouth. Expectorant, as needed    . hydrALAZINE (APRESOLINE) 50 MG tablet Take 50 mg by mouth 2 (two) times daily.     Marland Kitchen lamoTRIgine (LAMICTAL) 200 MG tablet Take 1.5 tablets (300 mg total) by mouth 2 (two) times daily. Brand Medically Necessary 270 tablet 3  . lamoTRIgine (LAMICTAL) 25 MG tablet Take 1 tablet (25 mg total) by mouth daily. 240 tablet 3  . levothyroxine (SYNTHROID) 150 MCG tablet Take 150 mcg by mouth daily. Am dosage    . magnesium oxide (MAG-OX) 400 MG tablet Take 400 mg by mouth daily. am    . metoprolol succinate (TOPROL XL) 50 MG 24 hr tablet Take 50 mg by mouth 2 (two) times daily. Take with or immediately following a meal.    . montelukast (SINGULAIR) 10 MG tablet Take 10 mg by mouth at bedtime.    Marland Kitchen omeprazole (PRILOSEC) 20 MG capsule Take 20 mg by mouth 2 (two) times daily.    . paricalcitol (ZEMPLAR) 1 MCG capsule Take 1 mcg by mouth daily. 2 tabs in am    . polyethylene glycol (MIRALAX) packet Take 17 g by mouth at bedtime. 17 mg, Am dosage, 1/2 dose qhs    . predniSONE (DELTASONE) 10 MG tablet Take 10 mg by mouth daily. 6 day decreasing number    . sevelamer (RENAGEL) 800 MG tablet Take 1,600 mg by mouth 3 (three) times daily with meals.     . solifenacin (VESICARE)  10 MG tablet Take 10 mg by mouth daily.     No current facility-administered medications for this visit.    Allergies as of 09/06/2014 - Review Complete 09/06/2014  Allergen Reaction Noted  . Cinnamon  11/09/2012  . Procrit [epoetin alfa]  11/10/2012  . Ace inhibitors Cough 09/06/2014  . Azithromycin  09/06/2014  . Neomycin-bacitracin zn-polymyx   09/06/2014  . Sulfa antibiotics  09/06/2014    Vitals: BP 138/70 mmHg  Pulse 64  Resp 18  Ht 5' 4.57" (1.64 m)  Wt 211 lb (95.709 kg)  BMI 35.58 kg/m2 Last Weight:  Wt Readings from Last 1 Encounters:  09/06/14 211 lb (95.709 kg)   Last Height:   Ht Readings from Last 1 Encounters:  09/06/14 5' 4.57" (1.64 m)   Vision Screening:   Physical exam:  General: The patient is awake, alert and appears not in acute distress. The patient is well groomed. Head: Normocephalic, atraumatic. Neck is supple. Mallampati 1 - there is no uvula , neck circumference:15.5 inches.   Low potassium diet. Lost 35 pounds  Cardiovascular:  Regular rate and rhythm, without  murmurs or carotid bruit, and without distended neck veins. Respiratory: Lungs are clear to auscultation. Skin: ankle  edema, no rash. Looking jaundiced.  Trunk: BMI is morbidly obese-  patient has normal posture.  Neurologic exam : The patient is awake and alert, oriented to place and time.  Memory subjective described as intact.  There is a normal attention span & concentration ability.  Speech is fluent without dysarthria or aphasia. Mood and affect are appropriate.  Cranial nerves: Pupils are equal and briskly reactive to light.   Visual fields by finger perimetry are intact. Hearing to finger rub intact.  Facial sensation intact to fine touch. Facial motor strength is symmetric and tongue moves midline.  Motor exam:   Normal tone and normal muscle bulk, obesity related attenuated DTR , and symmetric normal strength in all extremities. Sensory:  Fine touch, pinprick and vibration were tested in all extremities and are presents in toes and feet.   Proprioception is tested and normal. Coordination: Rapid alternating movements in the fingers/hands is tested and normal. Finger-to-nose maneuver tested and normal without evidence of ataxia, dysmetria or tremor.  Gait and station: Patient walks without assistive device and is very  slow -  Appears truncally rigid , with no lumbar rotation, turns with 5 steps, and has reduced step width. Strength within normal limits. Stance is stable and normal. Tandem gait is fragmented.  Deep tendon reflexes: in the  upper and lower extremities are symmetric and intact. Babinski maneuver response is down going on the left and equivocal on the right.   Assessment:  After physical and neurologic examination, review of laboratory studies, imaging, neurophysiology testing and pre-existing records, assessment is:   There is no clear evidence of neuropathy in this patient with diabetes , who has  preserved vibratory sense in both ankles.  However ataxia on tandem gait is evident; this could well be related to long term Tegretol. She denies any diplopia. She has no nystagmus.  There was no impairment of the upper extremity finger-nose maneuver.   Plan:   See labs.  Treatment plan and additional workup will be reviewed under Problem List. Her diabetes is better controlled at this point - she has not had hypoglycemic events which has had for the past sometimes look like convulsive seizures . Her HTN is improving. She takes 1 mg klonopin at bed time.  Her  renal function is declining, she hasa fistula placed ( January 4th ) , she is evaluated for a possible donor organ. Her next nephrology appoitnment is in 2015.  Her spells have not occurred for over 18 month , no seizure-    Refills :  Rv every 6 month alternating between  NP and me.  Refill for Lamictal, Tegretol, ethosuximide, Klonopin today.

## 2014-10-12 ENCOUNTER — Telehealth: Payer: Self-pay | Admitting: Urology

## 2014-10-12 DIAGNOSIS — R32 Unspecified urinary incontinence: Secondary | ICD-10-CM

## 2014-10-12 NOTE — Telephone Encounter (Signed)
Pt called about Rx Vesicare 10 mg that she wishes to be refilled today due to going out of town Saturday. Best contact # [phone] (Cell) [phone] (Home). Pt verbally stated that we could leave a message on her machine.  Did transfer her to nurse line.  10/12/14 maf

## 2014-10-12 NOTE — Telephone Encounter (Signed)
Can pt have refill on vesicare 10mg ?

## 2014-10-13 NOTE — Telephone Encounter (Signed)
Yes.  She may have a refill on her Vesicare 10 mg daily.

## 2014-10-16 MED ORDER — SOLIFENACIN SUCCINATE 10 MG PO TABS: 10.0000 mg | ORAL_TABLET | Freq: Every day | ORAL | Status: DC

## 2014-12-20 ENCOUNTER — Emergency Department: Payer: Medicare Other

## 2014-12-20 ENCOUNTER — Encounter: Payer: Self-pay | Admitting: Emergency Medicine

## 2014-12-20 ENCOUNTER — Emergency Department
Admission: EM | Admit: 2014-12-20 | Discharge: 2014-12-20 | Disposition: A | Discharge status: Home / Self Care | Payer: Medicare Other | Attending: Emergency Medicine | Admitting: Emergency Medicine

## 2014-12-20 ENCOUNTER — Other Ambulatory Visit: Payer: Self-pay

## 2014-12-20 DIAGNOSIS — R079 Chest pain, unspecified: Secondary | ICD-10-CM | POA: Diagnosis not present

## 2014-12-20 DIAGNOSIS — I129 Hypertensive chronic kidney disease with stage 1 through stage 4 chronic kidney disease, or unspecified chronic kidney disease: Secondary | ICD-10-CM | POA: Diagnosis not present

## 2014-12-20 DIAGNOSIS — R2243 Localized swelling, mass and lump, lower limb, bilateral: Secondary | ICD-10-CM | POA: Insufficient documentation

## 2014-12-20 DIAGNOSIS — N189 Chronic kidney disease, unspecified: Secondary | ICD-10-CM | POA: Diagnosis not present

## 2014-12-20 DIAGNOSIS — Z7982 Long term (current) use of aspirin: Secondary | ICD-10-CM | POA: Diagnosis not present

## 2014-12-20 DIAGNOSIS — E119 Type 2 diabetes mellitus without complications: Secondary | ICD-10-CM | POA: Diagnosis not present

## 2014-12-20 DIAGNOSIS — Z79899 Other long term (current) drug therapy: Secondary | ICD-10-CM | POA: Diagnosis not present

## 2014-12-20 LAB — COMPREHENSIVE METABOLIC PANEL
ALT: 6 U/L — ABNORMAL LOW (ref 14–54)
AST: 14 U/L — ABNORMAL LOW (ref 15–41)
Albumin: 3.9 g/dL (ref 3.5–5.0)
Alkaline Phosphatase: 178 U/L — ABNORMAL HIGH (ref 38–126)
Anion gap: 14 (ref 5–15)
BUN: 110 mg/dL — ABNORMAL HIGH (ref 6–20)
CO2: 19 mmol/L — ABNORMAL LOW (ref 22–32)
Calcium: 8.1 mg/dL — ABNORMAL LOW (ref 8.9–10.3)
Chloride: 101 mmol/L (ref 101–111)
Creatinine, Ser: 5.44 mg/dL — ABNORMAL HIGH (ref 0.44–1.00)
GFR calc Af Amer: 9 mL/min — ABNORMAL LOW (ref >60)
GFR calc non Af Amer: 8 mL/min — ABNORMAL LOW (ref >60)
Glucose, Bld: 106 mg/dL — ABNORMAL HIGH (ref 65–99)
Potassium: 5 mmol/L (ref 3.5–5.1)
Sodium: 134 mmol/L — ABNORMAL LOW (ref 135–145)
Total Bilirubin: 0.4 mg/dL (ref 0.3–1.2)
Total Protein: 7.1 g/dL (ref 6.5–8.1)

## 2014-12-20 LAB — CBC WITH DIFFERENTIAL/PLATELET
Basophils Absolute: 0.1 10*3/uL (ref 0–0.1)
Basophils Relative: 1 %
Eosinophils Absolute: 0.1 10*3/uL (ref 0–0.7)
Eosinophils Relative: 1 %
HCT: 23.2 % — ABNORMAL LOW (ref 35.0–47.0)
Hemoglobin: 7.6 g/dL — ABNORMAL LOW (ref 12.0–16.0)
Lymphocytes Relative: 14 %
Lymphs Abs: 1 10*3/uL (ref 1.0–3.6)
MCH: 33.7 pg (ref 26.0–34.0)
MCHC: 32.5 g/dL (ref 32.0–36.0)
MCV: 103.6 fL — ABNORMAL HIGH (ref 80.0–100.0)
Monocytes Absolute: 0.5 10*3/uL (ref 0.2–0.9)
Monocytes Relative: 7 %
Neutro Abs: 5.6 10*3/uL (ref 1.4–6.5)
Neutrophils Relative %: 77 %
Platelets: 186 10*3/uL (ref 150–440)
RBC: 2.24 MIL/uL — ABNORMAL LOW (ref 3.80–5.20)
RDW: 13.7 % (ref 11.5–14.5)
WBC: 7.2 10*3/uL (ref 3.6–11.0)

## 2014-12-20 LAB — TROPONIN I: Troponin I: <0.03 ng/mL (ref <0.031)

## 2014-12-20 MED ORDER — ACETAMINOPHEN 325 MG PO TABS
650.0000 mg | ORAL_TABLET | Freq: Once | ORAL | Status: AC
  Administered 2014-12-20: 650 mg via ORAL
  Filled 2014-12-20: qty 2

## 2014-12-20 NOTE — ED Notes (Signed)
Describes a 10 day history of intermittent right sided chest pain.  Hurts worse with movement or when inhaling.  Uses CPAP at night.

## 2014-12-20 NOTE — ED Notes (Signed)
Pain in right side of chest x 1 wk. Ha at times.  Denies sob or diaphoresis.

## 2014-12-20 NOTE — ED Notes (Signed)
AAOx3.  Skin warm and dry.  NAD.  Ambulates with easy and steady gait.  No SOB/ DOE/  D/C home

## 2014-12-20 NOTE — Discharge Instructions (Signed)
No certain cause was found for your right sided chest discomfort, however I suspect it may be muscular, given that it is worse when you move around. Your exam and evaluation are reassuring tonight. Return to the emergency department for any new or worsening condition including an with breathing, shortness of breath, fever, irregular heartbeat or racing heartbeat, abdominal pain or any other symptoms concerning to you. Please call your cardiologist, Dr. Nehemiah Massed, for follow-up this week.  We discussed your creatinine is 5.44 today, last month based on [facility] notes was 5.1.   Chest Pain (Nonspecific) It is often hard to give a specific diagnosis for the cause of chest pain. There is always a chance that your pain could be related to something serious, such as a heart attack or a blood clot in the lungs. You need to follow up with your health care provider for further evaluation. CAUSES   Heartburn.  Pneumonia or bronchitis.  Anxiety or stress.  Inflammation around your heart (pericarditis) or lung (pleuritis or pleurisy).  A blood clot in the lung.  A collapsed lung (pneumothorax). It can develop suddenly on its own (spontaneous pneumothorax) or from trauma to the chest.  Shingles infection (herpes zoster virus). The chest wall is composed of bones, muscles, and cartilage. Any of these can be the source of the pain.  The bones can be bruised by injury.  The muscles or cartilage can be strained by coughing or overwork.  The cartilage can be affected by inflammation and become sore (costochondritis). DIAGNOSIS  Lab tests or other studies may be needed to find the cause of your pain. Your health care provider may have you take a test called an ambulatory electrocardiogram (ECG). An ECG records your heartbeat patterns over a 24-hour period. You may also have other tests, such as:  Transthoracic echocardiogram (TTE). During echocardiography, sound waves are used to evaluate how blood flows  through your heart.  Transesophageal echocardiogram (TEE).  Cardiac monitoring. This allows your health care provider to monitor your heart rate and rhythm in real time.  Holter monitor. This is a portable device that records your heartbeat and can help diagnose heart arrhythmias. It allows your health care provider to track your heart activity for several days, if needed.  Stress tests by exercise or by giving medicine that makes the heart beat faster. TREATMENT   Treatment depends on what may be causing your chest pain. Treatment may include:  Acid blockers for heartburn.  Anti-inflammatory medicine.  Pain medicine for inflammatory conditions.  Antibiotics if an infection is present.  You may be advised to change lifestyle habits. This includes stopping smoking and avoiding alcohol, caffeine, and chocolate.  You may be advised to keep your head raised (elevated) when sleeping. This reduces the chance of acid going backward from your stomach into your esophagus. Most of the time, nonspecific chest pain will improve within 2-3 days with rest and mild pain medicine.  HOME CARE INSTRUCTIONS   If antibiotics were prescribed, take them as directed. Finish them even if you start to feel better.  For the next few days, avoid physical activities that bring on chest pain. Continue physical activities as directed.  Do not use any tobacco products, including cigarettes, chewing tobacco, or electronic cigarettes.  Avoid drinking alcohol.  Only take medicine as directed by your health care provider.  Follow your health care provider's suggestions for further testing if your chest pain does not go away.  Keep any follow-up appointments you made. If you  do not go to an appointment, you could develop lasting (chronic) problems with pain. If there is any problem keeping an appointment, call to reschedule. SEEK MEDICAL CARE IF:   Your chest pain does not go away, even after treatment.  You  have a rash with blisters on your chest.  You have a fever. SEEK IMMEDIATE MEDICAL CARE IF:   You have increased chest pain or pain that spreads to your arm, neck, jaw, back, or abdomen.  You have shortness of breath.  You have an increasing cough, or you cough up blood.  You have severe back or abdominal pain.  You feel nauseous or vomit.  You have severe weakness.  You faint.  You have chills. This is an emergency. Do not wait to see if the pain will go away. Get medical help at once. Call your local emergency services (911 in [place] Do not drive yourself to the hospital. MAKE SURE YOU:   Understand these instructions.  Will watch your condition.  Will get help right away if you are not doing well or get worse. Document Released: 01/29/2005 Document Revised: 04/26/2013 Document Reviewed: 11/25/2007 [facility] Patient Information 2015 [place], [place]. This information is not intended to replace advice given to you by your health care provider. Make sure you discuss any questions you have with your health care provider.

## 2014-12-20 NOTE — ED Provider Notes (Signed)
[facility] Emergency Department Provider Note   ____________________________________________  Time seen: 8:45 PM I have reviewed the triage vital signs and the triage nursing note.  HISTORY  Chief Complaint Chest Pain   Historian Patient, father and mother  HPI Nina Johnston is a 54 y.o. female who has a history of obesity, hypertension, enlarged heart, and chronic worsening renal disease, is presenting with a complaint of right lower chest pain which is sharp and worse with movement. This is been going on for about 1 week and has not had it evaluated yet. Today it seemed a little worse and seemed to radiate around to the right side of her back. Currently the pain is much better and only a 4 out of 10. Pain is worse when she tries to sit up or turn to the side. She notices it slightly worse when she takes a really deep breath, but however normal breathing she has no pleuritic chest pain. Her father states that she has a history of having painful complaints for which doctors cannot find a source or cause. She has not had a cough or fever. In terms of her renal function, she is followed by a specialist at [facility]. She has recently had a left arm fistula placed in preparation for dialysis soon. No nausea, sweating, weakness, numbness.    Past Medical History  Diagnosis Date  . Morbid obesity   . Hypertension   . Family history of thyroid problem   . Anxiety and depression   . Diabetes mellitus without complication   . Seizures   . Heart disease     enlarged because of COPD  . Renal disease     w/ GFR 29-may be due to diabetes  . Bronchitis   . Asthma   . 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 Transient alteration of awareness 11/10/2012  . Concussion 01-14-14    Fall   . Urinary incontinence with continuous leakage 03/08/2014  . Post-concussion  syndrome 03/08/2014    Patient Active Problem List   Diagnosis Date Noted  . Urinary incontinence with continuous leakage 03/08/2014  . Post-concussion syndrome 03/08/2014  . Ataxia 11/10/2012  . 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 Transient alteration of awareness 11/10/2012  . Severe obesity (BMI >= 40) 11/10/2012    Past Surgical History  Procedure Laterality Date  . Cholecystectomy    . Endometrial biopsy      ablation, uterine  . Avf      Current Outpatient Rx  Name  Route  Sig  Dispense  Refill  . amLODipine (NORVASC) 5 MG tablet   Oral   Take 5 mg by mouth daily.         Marland Kitchen aspirin 81 MG tablet   Oral   Take 81 mg by mouth at bedtime as needed for pain.         . carbamazepine (TEGRETOL-XR) 100 MG 12 hr tablet      Two tablets in the morning and one tablet in the evening.   270 tablet   3   . cefdinir (OMNICEF) 300 MG capsule   Oral   Take 300 mg by mouth daily. When needed.         . clonazePAM (KLONOPIN) 1 MG tablet      1 mg  at bedtime   30 tablet  5     Dr chin -   . cyanocobalamin (,VITAMIN B-12,) 1000 MCG/ML injection   Intramuscular   Inject 1,000 mcg into the muscle every 30 (thirty) days.         Mariane Baumgarten Calcium (STOOL SOFTENER PO)   Oral   Take by mouth 2 (two) times daily. 2 regular in am , 1 with stimulant in pm         . ethosuximide (ZARONTIN) 250 MG/5ML solution   Oral   Take 5 mLs (250 mg total) by mouth 2 (two) times daily.   474 mL   3   . Febuxostat (ULORIC) 80 MG TABS   Oral   Take by mouth daily. Morning dosage         . FERROUS SULFATE PO   Oral   Take 325 mg by mouth. 2 tabs in am and 1 tab in pm         . fexofenadine (ALLEGRA) 180 MG tablet   Oral   Take 180 mg by mouth daily. am         . Fluticasone-Salmeterol (ADVAIR DISKUS) 250-50 MCG/DOSE AEPB   Inhalation   Inhale 1 puff into the lungs 2 (two) times daily.         . furosemide (LASIX) 20 MG tablet   Oral   Take 20 mg by mouth.  Am dosage         . furosemide (LASIX) 40 MG tablet      Take 40 mg in am and 20 mg (1/2 a pill) around 4-5 pm.         . gemfibrozil (LOPID) 600 MG tablet   Oral   Take 600 mg by mouth 2 (two) times daily before a meal.         . glipiZIDE (GLUCOTROL) 5 MG tablet   Oral   Take 10 mg by mouth 2 (two) times daily before a meal.          . GuaiFENesin (MUCINEX PO)   Oral   Take by mouth. Expectorant, as needed         . hydrALAZINE (APRESOLINE) 50 MG tablet   Oral   Take 50 mg by mouth 2 (two) times daily.          Marland Kitchen lamoTRIgine (LAMICTAL) 200 MG tablet   Oral   Take 1.5 tablets (300 mg total) by mouth 2 (two) times daily. Brand Medically Necessary   270 tablet   3   . lamoTRIgine (LAMICTAL) 25 MG tablet   Oral   Take 1 tablet (25 mg total) by mouth daily.   240 tablet   3   . levothyroxine (SYNTHROID) 150 MCG tablet   Oral   Take 150 mcg by mouth daily. Am dosage         . magnesium oxide (MAG-OX) 400 MG tablet   Oral   Take 400 mg by mouth daily. am         . metoprolol succinate (TOPROL XL) 50 MG 24 hr tablet   Oral   Take 50 mg by mouth 2 (two) times daily. Take with or immediately following a meal.         . montelukast (SINGULAIR) 10 MG tablet   Oral   Take 10 mg by mouth at bedtime.         Marland Kitchen omeprazole (PRILOSEC) 20 MG capsule   Oral   Take 20 mg by mouth 2 (two) times daily.         Marland Kitchen  paricalcitol (ZEMPLAR) 1 MCG capsule   Oral   Take 1 mcg by mouth daily. 2 tabs in am         . polyethylene glycol (MIRALAX) packet   Oral   Take 17 g by mouth at bedtime. 17 mg, Am dosage, 1/2 dose qhs         . predniSONE (DELTASONE) 10 MG tablet   Oral   Take 10 mg by mouth daily. 6 day decreasing number         . sevelamer (RENAGEL) 800 MG tablet   Oral   Take 1,600 mg by mouth 3 (three) times daily with meals.          . solifenacin (VESICARE) 10 MG tablet   Oral   Take 1 tablet (10 mg total) by mouth daily.   90 tablet    3     Allergies Cinnamon; Procrit; Ace inhibitors; Azithromycin; Neomycin-bacitracin zn-polymyx; and Sulfa antibiotics  Family History  Problem Relation Age of Onset  . Diabetes Mother   . Lumbar disc disease Mother   . Migraines Mother   . Cancer - Prostate Father     mild    Social History Social History  Substance Use Topics  . Smoking status: Never Smoker   . Smokeless tobacco: Never Used  . Alcohol Use: No    Review of Systems  Constitutional: Negative for fever. Eyes: Negative for visual changes. ENT: Negative for sore throat. Cardiovascular: Negative for palpitations or arrhythmia.Marland Kitchen Respiratory: Negative for shortness of breath. Gastrointestinal: Negative for abdominal pain, vomiting and diarrhea. Genitourinary: Negative for dysuria. Musculoskeletal: Negative for back pain. Skin: Negative for rash. Neurological: Negative for headaches, focal weakness or numbness. 10 point Review of Systems otherwise negative ____________________________________________   PHYSICAL EXAM:  VITAL SIGNS: ED Triage Vitals  Enc Vitals Group     BP 12/20/14 1653 161/65 mmHg     Pulse Rate 12/20/14 1653 65     Resp 12/20/14 1653 20     Temp 12/20/14 1653 98 F (36.7 C)     Temp Source 12/20/14 1653 Oral     SpO2 12/20/14 1952 99 %     Weight 12/20/14 1653 210 lb (95.255 kg)     Height 12/20/14 1653 5\' 2"  (1.575 m)     Head Cir --      Peak Flow --      Pain Score 12/20/14 1654 5     Pain Loc --      Pain Edu? --      Excl. in [place]? --      Constitutional: Alert and oriented. Well appearing and in no distress. Eyes: Conjunctivae are normal. PERRL. Normal extraocular movements. ENT   Head: Normocephalic and atraumatic.   Nose: No congestion/rhinnorhea.   Mouth/Throat: Mucous membranes are moist.   Neck: No stridor. Cardiovascular/Chest: Normal rate, regular rhythm.  No murmurs, rubs, or gallops. Respiratory: Normal respiratory effort without tachypnea nor  retractions. Breath sounds are clear and equal bilaterally. No wheezes/rales/rhonchi. Gastrointestinal: Soft. No distention, no guarding, no rebound. Nontender . Morbidly obese.  Genitourinary/rectal:Deferred Musculoskeletal: Nontender with normal range of motion in all extremities. No joint effusions. Nontender lower extremity. 3+ bilateral pitting edema lower extremity is. Neurologic:  Normal speech and language. No gross or focal neurologic deficits are appreciated. Skin:  Skin is warm, dry and intact. No rash noted. Psychiatric: Mood and affect are normal. Speech and behavior are normal. Patient exhibits appropriate insight and judgment.  ____________________________________________   EKG I,  Lisa Roca, MD, the attending physician have personally viewed and interpreted all ECGs.  64 beats per minute. normal sinus rhythm with sinus arrhythmia. Narrow QRS. Normal axis. Nonspecific T wave. ____________________________________________  LABS (pertinent positives/negatives)  CBC shows white blood cell count 7.2, hemoglobin 7.6, platelet count [id number] Complete metabolic panel significant for sodium 134, CO2 19, BUN 110 and creatinine 5.44 AST 14, a LT 6, alkaline phosphatase 178 Troponin less than 0.03  ____________________________________________  RADIOLOGY All Xrays were viewed by me. Imaging interpreted by Radiologist.  Chest x-ray portable: Chronic cardiomegaly. Vascular congestion. __________________________________________  PROCEDURES  Procedure(s) performed: None Critical Care performed: None  ____________________________________________   ED COURSE / ASSESSMENT AND PLAN  CONSULTATIONS: None  Pertinent labs & imaging results that were available during my care of the patient were reviewed by me and considered in my medical decision making (see chart for details).   The patient's here for intermittent right side chest discomfort which seems musculoskeletal as it is  brought on by movement of her trunk whether that be sitting up turning sideways or taking extremity deep breaths. It's not really associated with any additional symptoms concerning for ACS, and her EKG shows no real concerning findings, and her troponin is negative. I do not suspect a PE, and she has had no shortness of breath, hypoxia, or tachycardia.  I discussed her reassuring exam and evaluation with the patient and her family, and they are comfortable with the plan of following up with her cardiologist.  Regarding her BUN and creatinine being elevated, I was able to obtain her previous and most recent creatinine from the care everywhere feature from [facility], which was 5.1. As her potassium level is 5.0, I think it is okay for her to discharge from the emergency department tonight. She is going to call her nephrologist for close follow-up. It sounds like she is already being very closely followed.  Patient / Family / Caregiver informed of clinical course, medical decision-making process, and agree with plan.   I discussed return precautions, follow-up instructions, and discharged instructions with patient and/or family.  ___________________________________________   FINAL CLINICAL IMPRESSION(S) / ED DIAGNOSES   Final diagnoses:  Chest pain, unspecified chest pain type    FOLLOW UP  Referred to: Patient's cardiologist Dr. Nehemiah Massed, 2 days. Patient's nephrologist at [facility], call tomorrow.   Lisa Roca, MD 12/20/14 2124

## 2014-12-22 DIAGNOSIS — R079 Chest pain, unspecified: Secondary | ICD-10-CM | POA: Insufficient documentation

## 2014-12-22 DIAGNOSIS — I6529 Occlusion and stenosis of unspecified carotid artery: Secondary | ICD-10-CM | POA: Insufficient documentation

## 2014-12-22 DIAGNOSIS — E782 Mixed hyperlipidemia: Secondary | ICD-10-CM | POA: Insufficient documentation

## 2014-12-22 DIAGNOSIS — I6523 Occlusion and stenosis of bilateral carotid arteries: Secondary | ICD-10-CM | POA: Insufficient documentation

## 2014-12-22 HISTORY — DX: Occlusion and stenosis of unspecified carotid artery: I65.29

## 2014-12-26 DIAGNOSIS — I10 Essential (primary) hypertension: Secondary | ICD-10-CM

## 2014-12-26 HISTORY — DX: Essential (primary) hypertension: I10

## 2015-02-12 ENCOUNTER — Encounter: Payer: Self-pay | Admitting: Neurology

## 2015-02-12 ENCOUNTER — Ambulatory Visit (INDEPENDENT_AMBULATORY_CARE_PROVIDER_SITE_OTHER): Payer: Medicare Other | Admitting: Neurology

## 2015-02-12 VITALS — BP 146/60 | HR 64 | Resp 20 | Ht 64.0 in | Wt 203.0 lb

## 2015-02-12 DIAGNOSIS — R638 Other symptoms and signs concerning food and fluid intake: Secondary | ICD-10-CM | POA: Insufficient documentation

## 2015-02-12 DIAGNOSIS — N184 Chronic kidney disease, stage 4 (severe): Secondary | ICD-10-CM | POA: Diagnosis not present

## 2015-02-12 DIAGNOSIS — E161 Other hypoglycemia: Secondary | ICD-10-CM

## 2015-02-12 DIAGNOSIS — E162 Hypoglycemia, unspecified: Secondary | ICD-10-CM | POA: Diagnosis not present

## 2015-02-12 DIAGNOSIS — Z01419 Encounter for gynecological examination (general) (routine) without abnormal findings: Secondary | ICD-10-CM | POA: Insufficient documentation

## 2015-02-12 DIAGNOSIS — M949 Disorder of cartilage, unspecified: Secondary | ICD-10-CM

## 2015-02-12 DIAGNOSIS — N812 Incomplete uterovaginal prolapse: Secondary | ICD-10-CM | POA: Insufficient documentation

## 2015-02-12 DIAGNOSIS — R21 Rash and other nonspecific skin eruption: Secondary | ICD-10-CM | POA: Insufficient documentation

## 2015-02-12 DIAGNOSIS — N200 Calculus of kidney: Secondary | ICD-10-CM | POA: Insufficient documentation

## 2015-02-12 DIAGNOSIS — R270 Ataxia, unspecified: Secondary | ICD-10-CM | POA: Diagnosis not present

## 2015-02-12 DIAGNOSIS — N912 Amenorrhea, unspecified: Secondary | ICD-10-CM | POA: Insufficient documentation

## 2015-02-12 DIAGNOSIS — R569 Unspecified convulsions: Secondary | ICD-10-CM | POA: Insufficient documentation

## 2015-02-12 DIAGNOSIS — J449 Chronic obstructive pulmonary disease, unspecified: Secondary | ICD-10-CM | POA: Insufficient documentation

## 2015-02-12 DIAGNOSIS — N814 Uterovaginal prolapse, unspecified: Secondary | ICD-10-CM | POA: Insufficient documentation

## 2015-02-12 DIAGNOSIS — K458 Other specified abdominal hernia without obstruction or gangrene: Secondary | ICD-10-CM | POA: Insufficient documentation

## 2015-02-12 DIAGNOSIS — R4789 Other speech disturbances: Secondary | ICD-10-CM | POA: Diagnosis not present

## 2015-02-12 DIAGNOSIS — M109 Gout, unspecified: Secondary | ICD-10-CM | POA: Insufficient documentation

## 2015-02-12 DIAGNOSIS — K589 Irritable bowel syndrome without diarrhea: Secondary | ICD-10-CM | POA: Insufficient documentation

## 2015-02-12 DIAGNOSIS — R404 Transient alteration of awareness: Secondary | ICD-10-CM

## 2015-02-12 DIAGNOSIS — N951 Menopausal and female climacteric states: Secondary | ICD-10-CM | POA: Insufficient documentation

## 2015-02-12 DIAGNOSIS — D649 Anemia, unspecified: Secondary | ICD-10-CM | POA: Insufficient documentation

## 2015-02-12 DIAGNOSIS — I10 Essential (primary) hypertension: Secondary | ICD-10-CM

## 2015-02-12 DIAGNOSIS — M1990 Unspecified osteoarthritis, unspecified site: Secondary | ICD-10-CM | POA: Insufficient documentation

## 2015-02-12 DIAGNOSIS — N399 Disorder of urinary system, unspecified: Secondary | ICD-10-CM | POA: Insufficient documentation

## 2015-02-12 DIAGNOSIS — M899 Disorder of bone, unspecified: Secondary | ICD-10-CM | POA: Insufficient documentation

## 2015-02-12 DIAGNOSIS — Z9889 Other specified postprocedural states: Secondary | ICD-10-CM | POA: Insufficient documentation

## 2015-02-12 DIAGNOSIS — M722 Plantar fascial fibromatosis: Secondary | ICD-10-CM | POA: Insufficient documentation

## 2015-02-12 DIAGNOSIS — G40309 Generalized idiopathic epilepsy and epileptic syndromes, not intractable, without status epilepticus: Secondary | ICD-10-CM | POA: Insufficient documentation

## 2015-02-12 DIAGNOSIS — R32 Unspecified urinary incontinence: Secondary | ICD-10-CM | POA: Insufficient documentation

## 2015-02-12 HISTORY — DX: Essential (primary) hypertension: I10

## 2015-02-12 MED ORDER — LAMOTRIGINE 25 MG PO TABS: 25.0000 mg | ORAL_TABLET | Freq: Every day | ORAL | Status: DC

## 2015-02-12 MED ORDER — CARBAMAZEPINE ER 100 MG PO TB12: ORAL_TABLET | ORAL | Status: DC

## 2015-02-12 MED ORDER — LAMOTRIGINE 200 MG PO TABS: 300.0000 mg | ORAL_TABLET | Freq: Two times a day (BID) | ORAL | Status: DC

## 2015-02-12 MED ORDER — CLONAZEPAM 1 MG PO TABS: ORAL_TABLET | ORAL | Status: DC

## 2015-02-12 MED ORDER — ETHOSUXIMIDE 250 MG/5ML PO SOLN: 250.0000 mg | Freq: Two times a day (BID) | ORAL | Status: DC

## 2015-02-12 NOTE — Progress Notes (Signed)
[facility]   Provider:  Dr Harman Langhans Referring Provider: / Primary Care Physician:  Idelle Crouch, MD  Recent HA- chief complaint.   HPI:  Nina Johnston is a 54 y.o. female here for a RV :  Routine Rv for refills. Patient is in the process for a renal transplant at [facility]. Med Record number :  [id number]  Nina Johnston renal function has declined over the last 3-1/2 years. She is not considered end chronic renal disease stage III. She developed anemia.  Her diabetes is better controlled and her HTN is the main factor in her ESRD. She goes twice at night to the bathroom.  She doesn't report diaphoresis, no falls and no dizziness. She had a spell of frequent headaches.  Note contact at [facility]; transplant. Dr . Talmadge Coventry,  Katheran Awe - [place] [id number].    This  54 year old, Caucasian  right-handed , single female has been followed in this practice since 2009.  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  She had such spells in August 2010 and September 2011 after carbamazepine was changed to a generic form of Carbatrol her blood levels were lower,  she also developed upper respiratory tract infections and pneumonia in 2011 and was unable for a while to use her CPAP which had been initiated in [place] for the treatment of obstructive sleep apnea. Paroxysmal events with mental status changes had continued,  03/17/2011  and 20 13 some of the spells are described as " automatisms"- continuing behavior while  the patient is staring off and acting without  reflecting not being  responsive to stimulation from the outside . After the spell passes, she became  very sleepy each time.  In August 2012 she was finally referred to an endocrinologist at [facility] after she had a hypoglycemic seizure and her diabetes medications were reduced, now her blood sugars are running between 150 and 200 in the morning,  fasting.Nina Johnston begun working out in 2013 with a Physiological scientist and was able to lose about 30 pounds . She returned to using her CPAP.her Hb A1 C. in October of last year was 6.1- she had no other hypoglycemic events reported since March 2013 . Also in 2013 she had to visit the local ER twice for abdominal spasms and cardiac / chest pain. negative work up. She was developing renal failure.  She has since been followed for a decreased renal clearance by internal medicine and nephrology. The patient is treated for her spells  and remains on Tegretol. It she will need blood levels for her anti-epiletic medications. Dr. Geryl Councilman is debating to reduce her Klonopin , which may help reduce her fall risk as well. Vit D deficiency- takes supplement.    Revisit from 09-06-14 Nina Johnston has been on a renal diet and has lost 45 pounds. She was finally evaluated and a AV fistula placed earlier this year. She has lost little bit of grip strength in the left hand but otherwise is doing well. Looking at her [facility] laboratory results creatinine was 4.6 calcium was 8.6 phosphorus was 5.6. Albumin was normal. Hematocrit was 7.9 she does have hyperparathyroidism at 202 her thyroid hormone units. Iron ferritin were normal  range. He continues to take phosphate binding medication 3 times a day. Also looked at her red blood cell count today is 2.67 as of November last year. Her creatinine was 4.11 as of November last year when Tested in our lab at [place] her medication list. She states on the medical Toprol and Tegretol. She still on ethosuximide. The patient just resumed driving last year and for this reason I will leave her on 3 antiepileptic drugs also clinically I would  be leaning towards weaning her off ethosuximide. It may be best to have either the transplant or the hemodialysis started and the metabolic panel stabilized before changing. I will today refill her Klonopin, ethosuximide, Tegretol and Lamictal   Revisit from 02-12-15; Nina Johnston's renal function has further declined since her last visit, and I reviewed her [facility] laboratories today which were dated 01/05/2015. Her hemoglobin was 8.1 and hematocrit is 25.1 and creatinine is 5.6 phosphorous 4 is 6.5 and BUN is 108. She also had a potassium level of 5.1 ferritin was 183 total iron binding capacity was 326 the mean platelet volume is decreased to 12.2 the mean red blood cell volume is 106. She is clearly iron deficient. Since Nina Johnston also suffers from a seizure disorder and severe diabetes and I have also followed her for a sleep apnea, I'm aware of the overlapping complications between medication and her neurologic dysfunctions. Procrit appeared to have lowered her seizure threshold (she was given this medication in2012 ) and she is safer to  resume iron infusions with her nephrologist.  However, she was told that repeated iron infusions may render her less able to accept a new kidney or increase her risk of rejection of the transplanted organ. Her second AV fistula for dialysis access was placed July 26th. The first one matured but never had enough flow.   If possible I would like for her to try Epogen (instead of procrit) -  it may affect her differently and I think that we can increase her Tegretol level, but remain on Lamictal and ethosuximide at the current doses.   I want to do this as long as the patient will need a erythropoietin-based medication to avoid lowering the seizure threshold.  She also appears by her skin turgor quite dehydrated today- and she does have a slightly bronzed skin of the renal insufficiency patient. She is needing to progress to haemo-dialysis while waiting. Needs Vit  D and B 12 supplements.    Review of Systems: Out of a complete 14 system review, the patient complains of only the following symptoms:   other reviewed systems are negative. High fall risk 10 points, none by age , 5 from medications.    Social History   Social History  . Marital Status: Single    Spouse Name: N/A  . Number of Children: 0  . Years of Education:  college   Occupational History  . not employed    Social History Main Topics  . Smoking status: Never Smoker   . Smokeless tobacco: Never Used  . Alcohol Use: No  . Drug Use: No  . Sexual Activity: Not on file   Other Topics Concern  . Not on file   Social History Narrative   Patient is single and lives with her parents.   Patient is disabled.   Patient has a college education.   Patient is right- handed.   Patient drinks tea occasionally. 2 glasses of tea when they eat out.  Family History  Problem Relation Age of Onset  . Diabetes Mother   . Lumbar disc disease Mother   . Migraines Mother   . Cancer - Prostate Father     mild    Past Medical History  Diagnosis Date  . Morbid obesity ([place])   . Hypertension   . Family history of thyroid problem   . Anxiety and depression   . Diabetes mellitus without complication ([place])   . Seizures ([place])   . Heart disease     enlarged because of COPD  . Renal disease     w/ GFR 29-may be due to diabetes  . Bronchitis   . Asthma   . 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 Transient alteration of awareness 11/10/2012  . Concussion 01-14-14    Fall   . Urinary incontinence with continuous leakage 03/08/2014  . Post-concussion syndrome 03/08/2014    Past Surgical History  Procedure Laterality Date  . Cholecystectomy    . Endometrial biopsy      ablation, uterine  . Avf      Current Outpatient Prescriptions  Medication Sig Dispense Refill  .  amLODipine (NORVASC) 5 MG tablet Take 5 mg by mouth daily.    Marland Kitchen aspirin 81 MG tablet Take 81 mg by mouth at bedtime as needed for pain.    . carbamazepine (TEGRETOL-XR) 100 MG 12 hr tablet Two tablets in the morning and one tablet in the evening. 270 tablet 3  . cefdinir (OMNICEF) 300 MG capsule Take 300 mg by mouth daily. When needed.    . clonazePAM (KLONOPIN) 1 MG tablet 1 mg  at bedtime 30 tablet 5  . cyanocobalamin (,VITAMIN B-12,) 1000 MCG/ML injection Inject 1,000 mcg into the muscle every 30 (thirty) days.    Mariane Baumgarten Calcium (STOOL SOFTENER PO) Take by mouth 2 (two) times daily. 2 regular in am , 1 with stimulant in pm    . ethosuximide (ZARONTIN) 250 MG/5ML solution Take 5 mLs (250 mg total) by mouth 2 (two) times daily. 474 mL 3  . Febuxostat (ULORIC) 80 MG TABS Take by mouth daily. Morning dosage    . FERROUS SULFATE PO Take 325 mg by mouth. 2 tabs in am and 1 tab in pm    . fexofenadine (ALLEGRA) 180 MG tablet Take 180 mg by mouth daily. am    . Fluticasone-Salmeterol (ADVAIR DISKUS) 250-50 MCG/DOSE AEPB Inhale 1 puff into the lungs 2 (two) times daily.    . furosemide (LASIX) 40 MG tablet 40 mg 2 (two) times daily.     Marland Kitchen gemfibrozil (LOPID) 600 MG tablet Take 600 mg by mouth 2 (two) times daily before a meal.    . glipiZIDE (GLUCOTROL) 5 MG tablet Take 10 mg by mouth 2 (two) times daily before a meal.     . GuaiFENesin (MUCINEX PO) Take by mouth. Expectorant, as needed    . hydrALAZINE (APRESOLINE) 50 MG tablet Take 50 mg by mouth 2 (two) times daily.     Marland Kitchen lamoTRIgine (LAMICTAL) 200 MG tablet Take 1.5 tablets (300 mg total) by mouth 2 (two) times daily. Brand Medically Necessary 270 tablet 3  . lamoTRIgine (LAMICTAL) 25 MG tablet Take 1 tablet (25 mg total) by mouth daily. 240 tablet 3  . levothyroxine (SYNTHROID) 150 MCG tablet Take 150 mcg by mouth daily. Am dosage    .  magnesium oxide (MAG-OX) 400 MG tablet Take 400 mg by mouth daily. am    . metoprolol succinate (TOPROL XL)  50 MG 24 hr tablet Take 50 mg by mouth 2 (two) times daily. Take with or immediately following a meal.    . montelukast (SINGULAIR) 10 MG tablet Take 10 mg by mouth at bedtime.    Marland Kitchen omeprazole (PRILOSEC) 20 MG capsule Take 20 mg by mouth 2 (two) times daily.    . paricalcitol (ZEMPLAR) 1 MCG capsule Take 1 mcg by mouth daily. 2 tabs in am    . polyethylene glycol (MIRALAX) packet Take 17 g by mouth at bedtime. 17 mg, Am dosage, 1/2 dose qhs    . predniSONE (DELTASONE) 10 MG tablet Take 10 mg by mouth daily. 6 day decreasing number    . sevelamer (RENAGEL) 800 MG tablet Take 1,600 mg by mouth 3 (three) times daily with meals.     . solifenacin (VESICARE) 10 MG tablet Take 1 tablet (10 mg total) by mouth daily. (Patient taking differently: Take 10 mg by mouth daily. Every other day) 90 tablet 3   No current facility-administered medications for this visit.    Allergies as of 02/12/2015 - Review Complete 02/12/2015  Allergen Reaction Noted  . Cinnamon  11/09/2012  . Procrit [epoetin alfa]  11/10/2012  . Ace inhibitors Cough 09/06/2014  . Azithromycin  09/06/2014  . Neomycin-bacitracin zn-polymyx  09/06/2014  . Sulfa antibiotics  09/06/2014    Vitals: BP 146/60 mmHg  Pulse 64  Resp 20  Ht 5\' 4"  (1.626 m)  Wt 203 lb (92.08 kg)  BMI 34.83 kg/m2 Last Weight:  Wt Readings from Last 1 Encounters:  02/12/15 203 lb (92.08 kg)   Last Height:   Ht Readings from Last 1 Encounters:  02/12/15 5\' 4"  (1.626 m)   Vision Screening:   Physical exam:  General: The patient is awake, alert and appears not in acute distress. The patient is well groomed. Head: Normocephalic, atraumatic. Neck is supple.  Mallampati 1 - there is no uvula , neck circumference:15.5 inches.   On renal low potassium diet, she lost 45 pounds .  Cardiovascular:  Regular rate and rhythm, without  murmurs or carotid bruit, and without distended neck veins. Respiratory: Lungs are clear to auscultation. Skin: ankle  edema,  no rash. Looking jaundiced, bronzed . Dry skin, low TURGOR, .  Trunk: BMI is morbidly obese-  patient has normal posture.  Neurologic exam : The patient is awake and alert, oriented to place and time.  Memory subjective described as intact.  There is a normal attention span & concentration ability.  Speech is fluent without dysarthria or aphasia. Mood and affect are appropriate.  Cranial nerves: Pupils are equal and briskly reactive to light.  Visual fields by finger perimetry are intact. Hearing to finger rub intact. Facial sensation intact to fine touch. Facial motor strength is symmetric and tongue moves midline.  Motor exam:  Normal tone and normal muscle bulk, obesity related attenuated DTR , and symmetric normal strength in all extremities. Sensory:  Fine touch, pinprick and vibration were tested in all extremities and are presents in toes and feet.   Proprioception is tested and normal. Coordination: Rapid alternating movements in the fingers/hands is tested and normal. Finger-to-nose maneuver tested and normal without evidence of ataxia, dysmetria or tremor.  Gait and station: Patient walks without assistive device and is very slow -  Appears truncally rigid , with no lumbar rotation, turns with 5 steps,  and has reduced step width. Strength within normal limits. Stance is stable and normal. Tandem gait is fragmented.  Deep tendon reflexes: in the upper and lower extremities are symmetric and intact. Babinski maneuver response is down going on the left and equivocal on the right.   Assessment:  After physical and neurologic examination, review of laboratory studies, imaging, neurophysiology testing and pre-existing records, 50 minute  Assessment with more than 50% of the face to face time directed to discussion of the emerging high grade renal failure and the overlapping with neurological symptoms,  Coordination of care  Is needed with nephrology at [facility], and her psychiatrist and PCP. :    I reviewed all recent laboratory results form nephrologist , and discussed her neurologic medications and conditions in overlap. She has to take a higher dose of tegretol to allow the seizure threshold to be raised for procrit/ epogen.  And she has higher risk of seizures while in dialyisis treatment , too. She may for this reason be a risk candidate for at home HD.   There is still no clear evidence of neuropathy in this patient with diabetes , who has  preserved vibratory sense in both ankles. She has ankle edema.  However ataxia on tandem gait is evident; this could well be related to long term Tegretol, which i cannot change easily. . She denies any diplopia. She has no nystagmus. She is B 12 def.  has severe renal impairment . There was no impairment of the upper extremity finger-nose maneuver.   Plan:   See labs.  Treatment plan and additional workup will be reviewed under Problem List. Her diabetes is better controlled at this point - she has not had hypoglycemic events which has had for the past sometimes look like convulsive seizures . Her HTN is improving. She takes 1 mg klonopin at bed time.  Her renal function is declining, she hasa fistula placed ( January 4th/ July 23 rd was the second)  ,  She is evaluated for a possible donor organ. Her next nephrology appointment will be November 3rd.   Her spells have not occurred for over 24 month , no seizures since Procrit was d/c .    Refills :  Rv every 6 month alternating between NP and me.  Refill for Lamictal, Tegretol increased ,  ethosuximide, Klonopin today.

## 2015-02-12 NOTE — Patient Instructions (Signed)
Ataxia Ataxia is a condition that results in unsteadiness when walking and standing, poor coordination of body movements, and difficulty maintaining an upright posture. It occurs due to a problem with the part of your brain that controls coordination and stability (cerebellar dysfunction).  CAUSES  Ataxia can develop later in life (acquired ataxia) during your 20s to 30s, and even as late as into your 60s or beyond. Acquired ataxia may be caused by:  Changes in your nervous system (neurodegenerative).  Changes throughout your body (systemic disorders).  Excess exposure to:  Medicines, such as phenytoin and lithium.  Solvents.  Abuse of alcohol (alcoholism).  Medical conditions, such as:  Celiac sprue.  Hypothyroidism.  Vitamin E deficiency.  Structural brain abnormalities, such as tumors.  Multiple sclerosis.  Stroke.  Head injury. Ataxia may also be present early in life (non-acquired ataxia). There are two main types of non-acquired ataxia:  Cerebellar dysfunction present at birth (congenital).  Family inheritance (genetic heredity). Friedreich ataxia is the most common form of hereditary ataxia. SIGNS AND SYMPTOMS The signs and symptoms of ataxia can vary depending on how severe the condition is that causes it. Signs and symptoms may include:  Unsteadiness.  Walking with a wide stance.  Tremor.  Poorly coordinated body movements.  Difficulty maintaining a straight (upright) posture.  Fatigue.  Changes in your speech.  Changes in your vision.  Difficulty swallowing.  Difficulty with writing.  Decreased mental status (dementia).  Muscle spasms. DIAGNOSIS  Ataxia is diagnosed by discussing your personal and family history and through a physical exam. You may also have additional tests such as:  MRI.  Genetic testing. TREATMENT  Treatment for ataxia may include treating or removing the underlying condition causing the ataxia. Surgery may be  required if a structural abnormality in your brain is causing the ataxia. Otherwise, supportive treatments may be used to manage your symptoms. HOME CARE INSTRUCTIONS Monitor your ataxia for any changes. The following actions may help any discomfort you are experiencing:   Do not drink alcohol.  Lie down right away if you become very unsteady, dizzy, nauseated, or feel like you are going to faint. Wait until all of these feelings pass before you get up again. SEEK IMMEDIATE MEDICAL CARE IF:  Your unsteadiness suddenly worsens.  You develop severe headaches, chest pain, or abdominal pain.   You have weakness or numbness on one side of your body.   You have problems with your vision.   You feel confused.   You have difficulty speaking.   You have an irregular heartbeat or a very fast pulse.    This information is not intended to replace advice given to you by your health care provider. Make sure you discuss any questions you have with your health care provider.   Document Released: 11/16/2013 Document Reviewed: 11/16/2013 Elsevier Interactive Patient Education [employer].   I discussed with Nina Johnston in her family present here today that prior to starting an erythropoietin derivat, I would like to be informed to adjust her seizure medicines to a higher dose. This is based on our experience in 2012. I have refilled all her other medications at the same dose she has usually taken. I have reviewed the laboratory results from [place], I'm expecting the patient now that she has a hopefully working fistula to have access to hemodialysis before the end of the year.  A revisit will be scheduled for early or mid December.

## 2015-02-28 NOTE — Telephone Encounter (Signed)
Error

## 2015-03-09 ENCOUNTER — Ambulatory Visit: Payer: Medicare Other | Admitting: Nurse Practitioner

## 2015-04-16 ENCOUNTER — Encounter: Payer: Self-pay | Admitting: Neurology

## 2015-04-16 ENCOUNTER — Ambulatory Visit (INDEPENDENT_AMBULATORY_CARE_PROVIDER_SITE_OTHER): Payer: Medicare Other | Admitting: Neurology

## 2015-04-16 VITALS — BP 138/68 | HR 70 | Resp 20 | Ht 62.5 in | Wt 201.0 lb

## 2015-04-16 DIAGNOSIS — E11649 Type 2 diabetes mellitus with hypoglycemia without coma: Secondary | ICD-10-CM

## 2015-04-16 NOTE — Progress Notes (Signed)
[facility]   Provider:  Dr Cara Thaxton Referring Provider: / Primary Care Physician:  Nina Crouch, MD  Recent HA- chief complaint.   HPI:  Nina Johnston is a 54 y.o. female here for a RV :  Routine Rv for refills. Patient is in the process for a renal transplant at [facility]. Med Record number :  [id number]  Nina Johnston renal function has declined over the last 3-1/2 years. She is not considered end chronic renal disease stage III. She developed anemia.  Her diabetes is better controlled and her HTN is the main factor in her ESRD. She goes twice at night to the bathroom.  She doesn't report diaphoresis, no falls and no dizziness. She had a spell of frequent headaches.  Note contact at [facility]; transplant. Dr . Nina Johnston,  Nina Johnston - Nina Johnston [id number].    This  54 year old, Caucasian  right-handed , single female has been followed in this practice since 2009.  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  She had such spells in August 2010 and September 2011 after carbamazepine was changed to a generic form of Carbatrol her blood levels were lower,  she also developed upper respiratory tract infections and pneumonia in 2011 and was unable for a while to use her CPAP which had been initiated in [place] for the treatment of obstructive sleep apnea. Paroxysmal events with mental status changes had continued,  03/17/2011  and 20 13 some of the spells are described as " automatisms"- continuing behavior while  the patient is staring off and acting without  reflecting not being  responsive to stimulation from the outside . After the spell passes, she became  very sleepy each time.  In August 2012 she was finally referred to an endocrinologist at [facility] after she had a hypoglycemic seizure and her diabetes medications were reduced, now her blood sugars are running between 150 and 200 in the morning,  fasting.[name] begun working out in 2013 with a Physiological scientist and was able to lose about 30 pounds . She returned to using her CPAP.her Hb A1 C. in October of last year was 6.1- she had no other hypoglycemic events reported since March 2013 . Also in 2013 she had to visit the local ER twice for abdominal spasms and cardiac / chest pain. negative work up. She was developing renal failure.  She has since been followed for a decreased renal clearance by internal medicine and nephrology. The patient is treated for her spells  and remains on Tegretol. It she will need blood levels for her anti-epiletic medications. Dr. Geryl Councilman is debating to reduce her Klonopin , which may help reduce her fall risk as well. Vit D deficiency- takes supplement.    Revisit from 09-06-14 Nina Johnston has been on a renal diet and has lost 45 pounds. She was finally evaluated and a AV fistula placed earlier this year. She has lost little bit of grip strength in the left hand but otherwise is doing well. Looking at her [facility] laboratory results creatinine was 4.6 calcium was 8.6 phosphorus was 5.6. Albumin was normal. Hematocrit was 7.9 she does have hyperparathyroidism at 202 her thyroid hormone units. Iron ferritin were normal  range. He continues to take phosphate binding medication 3 times a day. Also looked at her red blood cell count today is 2.67 as of November last year. Her creatinine was 4.11 as of November last year when Tested in our lab at [facility] her medication list. She states on the medical Toprol and Tegretol. She still on ethosuximide. The patient just resumed driving last year and for this reason I will leave her on 3 antiepileptic drugs also clinically I would  be leaning towards weaning her off ethosuximide. It may be best to have either the transplant or the hemodialysis started and the metabolic panel stabilized before changing. I will today refill her Klonopin, ethosuximide, Tegretol and Lamictal   Revisit from 02-12-15; Nina Johnston's renal function has further declined since her last visit, and I reviewed her [facility] laboratories today which were dated 01/05/2015. Her hemoglobin was 8.1 and hematocrit is 25.1 and creatinine is 5.6 phosphorous 4 is 6.5 and BUN is 108. She also had a potassium level of 5.1 ferritin was 183 total iron binding capacity was 326 the mean platelet volume is decreased to 12.2 the mean red blood cell volume is 106. She is clearly iron deficient. Since Nina Johnston also suffers from a seizure disorder and severe diabetes and I have also followed her for a sleep apnea, I'm aware of the overlapping complications between medication and her neurologic dysfunctions. Procrit appeared to have lowered her seizure threshold (she was given this medication in2012 ) and she is safer to  resume iron infusions with her nephrologist.  However, she was told that repeated iron infusions may render her less able to accept a new kidney or increase her risk of rejection of the transplanted organ. Her second AV fistula for dialysis access was placed July 26th. The first one matured but never had enough flow.   If possible I would like for her to try Epogen (instead of procrit) -  it may affect her differently and I think that we can increase her Tegretol level, but remain on Lamictal and ethosuximide at the current doses.   I want to do this as long as the patient will need a erythropoietin-based medication to avoid lowering the seizure threshold.  She also appears by her skin turgor quite dehydrated today- and she does have a slightly bronzed skin of the renal insufficiency patient. She is needing to progress to haemo-dialysis while waiting. Needs Vit  D and B 12 supplements.    Review of Systems: Out of a complete 14 system review, the patient complains of only the following symptoms:  Decreasing renal clearance , Creatinine 5.4 . Recurrent convulsions due to hypoglycemia.  No driving allowed.   High fall risk @10  points, none by age , 5 from medications.      Social History   Social History  . Marital Status: Single    Spouse Name: N/A  . Number of Children: 0  . Years of Education:  college   Occupational History  . not employed    Social History Main Topics  . Smoking status: Never Smoker   . Smokeless tobacco: Never Used  . Alcohol Use: No  . Drug Use: No  . Sexual Activity: Not on file   Other Topics Concern  . Not on file   Social History Narrative   Patient is single and lives with her parents.   Patient is disabled.   Patient has a college education.   Patient is right- handed.  Patient drinks tea occasionally. 2 glasses of tea when they eat out.    Family History  Problem Relation Age of Onset  . Diabetes Mother   . Lumbar disc disease Mother   . Migraines Mother   . Cancer - Prostate Father     mild    Past Medical History  Diagnosis Date  . Morbid obesity ([place])   . Hypertension   . Family history of thyroid problem   . Anxiety and depression   . Diabetes mellitus without complication ([place])   . Seizures ([place])   . Heart disease     enlarged because of COPD  . Renal disease     w/ GFR 29-may be due to diabetes  . Bronchitis   . Asthma   . 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 Transient alteration of awareness 11/10/2012  . Concussion 01-14-14    Fall   . Urinary incontinence with continuous leakage 03/08/2014  . Post-concussion syndrome 03/08/2014    Past Surgical History  Procedure Laterality Date  . Cholecystectomy    . Endometrial biopsy      ablation, uterine  . Avf       Current Outpatient Prescriptions  Medication Sig Dispense Refill  . amLODipine (NORVASC) 5 MG tablet Take 5 mg by mouth daily.    Marland Kitchen aspirin 81 MG tablet Take 81 mg by mouth at bedtime as needed for pain.    . carbamazepine (TEGRETOL-XR) 100 MG 12 hr tablet Two tablets in the morning and one tablet in the evening. 270 tablet 3  . cefdinir (OMNICEF) 300 MG capsule Take 300 mg by mouth daily. When needed.    . clonazePAM (KLONOPIN) 1 MG tablet 1 mg  at bedtime 30 tablet 5  . cyanocobalamin (,VITAMIN B-12,) 1000 MCG/ML injection Inject 1,000 mcg into the muscle every 30 (thirty) days.    Mariane Baumgarten Calcium (STOOL SOFTENER PO) Take by mouth 2 (two) times daily. 2 regular in am , 1 with stimulant in pm    . ethosuximide (ZARONTIN) 250 MG/5ML solution Take 5 mLs (250 mg total) by mouth 2 (two) times daily. 474 mL 3  . Febuxostat (ULORIC) 80 MG TABS Take by mouth daily. Morning dosage    . FERROUS SULFATE PO Take 325 mg by mouth. 2 tabs in am and 1 tab in pm    . fexofenadine (ALLEGRA) 180 MG tablet Take 180 mg by mouth daily. am    . Fluticasone-Salmeterol (ADVAIR DISKUS) 250-50 MCG/DOSE AEPB Inhale 1 puff into the lungs 2 (two) times daily.    . furosemide (LASIX) 40 MG tablet 40 mg 2 (two) times daily.     Marland Kitchen gemfibrozil (LOPID) 600 MG tablet Take 600 mg by mouth 2 (two) times daily before a meal.    . GuaiFENesin (MUCINEX PO) Take by mouth. Expectorant, as needed    . hydrALAZINE (APRESOLINE) 50 MG tablet Take 50 mg by mouth 2 (two) times daily.     Marland Kitchen lamoTRIgine (LAMICTAL) 200 MG tablet Take 1.5 tablets (300 mg total) by mouth 2 (two) times daily. Brand Medically Necessary 270 tablet 3  . lamoTRIgine (LAMICTAL) 25 MG tablet Take 1 tablet (25 mg total) by mouth daily. 240 tablet 3  . levothyroxine (SYNTHROID) 150 MCG tablet Take 150 mcg by mouth daily. Am dosage    . magnesium oxide (MAG-OX) 400 MG  tablet Take 400 mg by mouth daily. am    . metoprolol succinate (TOPROL XL) 50 MG 24 hr tablet  Take 50 mg by mouth 2 (two) times daily. Take with or immediately following a meal.    . montelukast (SINGULAIR) 10 MG tablet Take 10 mg by mouth at bedtime.    Marland Kitchen omeprazole (PRILOSEC) 20 MG capsule Take 20 mg by mouth 2 (two) times daily.    . paricalcitol (ZEMPLAR) 1 MCG capsule Take 1 mcg by mouth daily. 2 tabs in am    . polyethylene glycol (MIRALAX) packet Take 17 g by mouth at bedtime. 17 mg, Am dosage, 1/2 dose qhs    . predniSONE (DELTASONE) 10 MG tablet Take 10 mg by mouth daily. 6 day decreasing number    . sevelamer (RENAGEL) 800 MG tablet Take 1,600 mg by mouth 3 (three) times daily with meals.     . solifenacin (VESICARE) 10 MG tablet Take 1 tablet (10 mg total) by mouth daily. (Patient taking differently: Take 10 mg by mouth daily. Every other day) 90 tablet 3   No current facility-administered medications for this visit.    Allergies as of 04/16/2015 - Review Complete 04/16/2015  Allergen Reaction Noted  . Cinnamon  11/09/2012  . Procrit [epoetin alfa]  11/10/2012  . Ace inhibitors Cough 09/06/2014  . Azithromycin  09/06/2014  . Neomycin-bacitracin zn-polymyx  09/06/2014  . Sulfa antibiotics  09/06/2014    Vitals: BP 138/68 mmHg  Pulse 70  Resp 20  Ht 5' 2.5" (1.588 m)  Wt 201 lb (91.173 kg)  BMI 36.15 kg/m2 Last Weight:  Wt Readings from Last 1 Encounters:  04/16/15 201 lb (91.173 kg)   Last Height:   Ht Readings from Last 1 Encounters:  04/16/15 5' 2.5" (1.588 m)   Vision Screening:   Physical exam:  General: The patient is awake, alert and appears not in acute distress. The patient is well groomed. Head: Normocephalic, atraumatic. Neck is supple.  Mallampati 1 - there is no uvula , neck circumference:15.5 inches.   On renal low potassium diet, she lost 45 pounds .  Cardiovascular:  Regular rate and rhythm, without  murmurs or carotid bruit, and without distended neck veins. Respiratory: Lungs are clear to auscultation. Skin: ankle  edema, no rash.  Looking jaundiced, bronzed .  Dry skin, low TURGOR, appears dehydrated and dusky. .  Trunk: BMI is morbidly obese-  patient has normal posture.   She has lost weight.   Neurologic exam : The patient is awake and alert, oriented to place and time. Memory subjective described as intact.  There is a normal attention span & concentration ability. Speech is fluent without dysarthria or aphasia. Mood and affect are appropriate. Cranial nerves: Pupils are equal and briskly reactive to light.  Visual fields by finger perimetry are intact. Hearing to finger rub intact. Facial sensation intact to fine touch. Facial motor strength is symmetric and tongue moves midline. Motor exam:  Normal tone and normal muscle bulk, obesity related attenuated DTR , and symmetric normal strength in all extremities. Sensory:  Fine touch, pinprick and vibration were tested in all extremities and are presents in toes and feet. Proprioception is tested and normal. Coordination: Rapid alternating movements in the fingers/hands is tested and normal. Finger-to-nose maneuver tested and normal without evidence of ataxia, dysmetria or tremor. Gait and station: Patient walks without assistive device and is very slow -   Appears truncally rigid, with no lumbar rotation, turns with 5 steps,  and has reduced step width. Strength within normal limits. Stance is stable and normal. Tandem gait is fragmented.  Deep tendon reflexes: in the upper and lower extremities are symmetric and intact. Babinski maneuver response is down going on the left and equivocal on the right.   Assessment:  After physical and neurologic examination, review of laboratory studies, imaging, neurophysiology testing and pre-existing records, 50 minute  Assessment with more than 50% of the face to face time directed to discussion of the emerging high grade renal failure and the overlapping with neurological symptoms,  Coordination of care  Is needed with nephrology at  [facility], and her psychiatrist and PCP. :   I reviewed all recent laboratory results form nephrologist , and discussed her neurologic medications and conditions in overlap. She has to take a higher dose of tegretol to allow the seizure threshold to be raised for procrit/ epogen.   And she has higher risk of seizures while in dialyisis treatment, too. She may for this reason be a risk candidate for at home HD.   There is still no clear evidence of neuropathy in this patient with diabetes , who has  preserved vibratory sense in both ankles. She has ankle edema.  However ataxia on tandem gait is evident; this could well be related to long term Tegretol, which Ii cannot change easily.  She denies any diplopia. She has no nystagmus. She is B 12 def.  has severe renal impairment.  There was no impairment of the upper extremity finger-nose maneuver.   Plan:   See labs.   Her diabetes is better controlled at this point - she has not had hypoglycemic events which has had for the past sometimes look like convulsive seizures . She suffered a viral infection, nausea and vomiting , which and return caused her to drop her  blood sugar and she became hypoglycemic.  She suffered several spells seizures in relation to the hypoglycemia. At one time her blood sugar level was 53. She convulsed and fell. She had slurred speech and incoordinated.   Her HTN is improving. Insomnia: She takes 1 mg klonopin at bed time.  Her renal function is further declining, she has an AV - fistula placed ( January 4th/ July 23 rd 2016 was the second) , and there is a sufficient thrill over the fistula.  She is evaluated for a possible donor organ. Her last nephrology appointment was  November 3rd.     Refills :  Rv every 6 month alternating between NP and me. Megan and Dr. Brett Fairy. She may have hemodialysis by then.  Refills for Lamictal, Tegretol increased, Ethosuximide, Klonopin were last written 3 month ago . She has seen PCP.  Dr. Leonides Johnston , last week.      Genia Perin, MD

## 2015-06-01 ENCOUNTER — Encounter: Payer: Self-pay | Admitting: *Deleted

## 2015-06-01 DIAGNOSIS — E119 Type 2 diabetes mellitus without complications: Secondary | ICD-10-CM | POA: Insufficient documentation

## 2015-06-06 ENCOUNTER — Encounter: Payer: Self-pay | Admitting: Urology

## 2015-06-06 ENCOUNTER — Ambulatory Visit (INDEPENDENT_AMBULATORY_CARE_PROVIDER_SITE_OTHER): Payer: Medicare Other | Admitting: Urology

## 2015-06-06 VITALS — BP 160/70 | HR 65 | Ht 62.5 in | Wt 204.0 lb

## 2015-06-06 DIAGNOSIS — R32 Unspecified urinary incontinence: Secondary | ICD-10-CM

## 2015-06-06 DIAGNOSIS — N3281 Overactive bladder: Secondary | ICD-10-CM

## 2015-06-06 DIAGNOSIS — J45909 Unspecified asthma, uncomplicated: Secondary | ICD-10-CM | POA: Insufficient documentation

## 2015-06-06 DIAGNOSIS — H532 Diplopia: Secondary | ICD-10-CM | POA: Insufficient documentation

## 2015-06-06 LAB — BLADDER SCAN AMB NON-IMAGING: Scan Result: 24

## 2015-06-06 MED ORDER — SOLIFENACIN SUCCINATE 10 MG PO TABS: 10.0000 mg | ORAL_TABLET | Freq: Every day | ORAL | Status: DC

## 2015-06-06 NOTE — Progress Notes (Signed)
Bladder Scan Patient void: 55ml Performed By: Larna Daughters

## 2015-06-06 NOTE — Progress Notes (Signed)
06/06/2015 10:37 AM   Nina Johnston 10/02/1960 [id number]  Referring provider: Idelle Crouch, MD [facility] [place], [place] [address]  Chief Complaint  Patient presents with  . Follow-up    OAB    HPI: Patient is a 55 year old Caucasian female who presents today for a yearly follow up for overactive bladder symptoms.    She has a history of urge incontinence.  She was going every hour was awake and has found relief with Vesicare 10 mg every other day.  She was evaluated by PT and completed 9 sessions.  She did see improvement in her urinary symptoms.  She is not experiencing gross hematuria, dysuria or suprapubic pain.  She is not experiencing fevers, chills, nausea or vomiting.    PMH: Past Medical History  Diagnosis Date  . Morbid obesity ([place])   . Hypertension   . Family history of thyroid problem   . Anxiety and depression   . Diabetes mellitus without complication ([place])   . Seizures ([place])   . Heart disease     enlarged because of COPD  . Renal disease     w/ GFR 29-may be due to diabetes  . Bronchitis   . Asthma   . 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 Transient alteration of awareness 11/10/2012  . Concussion 01-14-14    Fall   . Urinary incontinence with continuous leakage 03/08/2014  . Post-concussion syndrome 03/08/2014  . HLD (hyperlipidemia)   . CKD (chronic kidney disease)   . Sleep apnea   . COPD (chronic obstructive pulmonary disease) ([place])   . Bipolar disorder ([place])   . Pernicious anemia   . GERD (gastroesophageal reflux disease)   . Multiple thyroid nodules   . Heart murmur   . Hyperthyroidism   . Hyperkalemia   . Diplopia   . OAB (overactive bladder)   . Benign essential HTN 12/26/2014  . Carotid artery narrowing 12/22/2014  . Essential (primary) hypertension 02/12/2015  . Acute bacterial sinusitis  04/19/2014    Surgical History: Past Surgical History  Procedure Laterality Date  . Cholecystectomy    . Endometrial biopsy      ablation, uterine  . Avf      Home Medications:    Medication List       This list is accurate as of: 06/06/15 10:37 AM.  Always use your most recent med list.               ADVAIR DISKUS 250-50 MCG/DOSE Aepb  Generic drug:  Fluticasone-Salmeterol  Inhale 1 puff into the lungs 2 (two) times daily.     ALLEGRA 180 MG tablet  Generic drug:  fexofenadine  Take 180 mg by mouth daily. am     aspirin 81 MG tablet  Take 81 mg by mouth at bedtime as needed for pain.     carbamazepine 100 MG 12 hr tablet  Commonly known as:  TEGRETOL-XR  Two tablets in the morning and one tablet in the evening.     cefdinir 300 MG capsule  Commonly known as:  OMNICEF  Take 300 mg by mouth daily. Reported on 06/06/2015     clonazePAM 1 MG tablet  Commonly known as:  KLONOPIN  1 mg  at bedtime     cyanocobalamin 1000 MCG/ML injection  Commonly known  as:  (VITAMIN B-12)  Inject 1,000 mcg into the muscle every 30 (thirty) days.     ethosuximide 250 MG/5ML solution  Commonly known as:  ZARONTIN  Take 5 mLs (250 mg total) by mouth 2 (two) times daily.     FERROUS SULFATE PO  Take 325 mg by mouth. 2 tabs in am and 1 tab in pm     furosemide 40 MG tablet  Commonly known as:  LASIX  40 mg 2 (two) times daily. Reported on 06/06/2015     hydrALAZINE 50 MG tablet  Commonly known as:  APRESOLINE  Take 50 mg by mouth 2 (two) times daily.     lamoTRIgine 25 MG tablet  Commonly known as:  LAMICTAL  Take 1 tablet (25 mg total) by mouth daily.     lamoTRIgine 200 MG tablet  Commonly known as:  LAMICTAL  Take 1.5 tablets (300 mg total) by mouth 2 (two) times daily. Brand Medically Necessary     LOPID 600 MG tablet  Generic drug:  gemfibrozil  Take 600 mg by mouth 2 (two) times daily before a meal.     magnesium oxide 400 MG tablet  Commonly known as:  MAG-OX    Take 400 mg by mouth daily. am     MIRALAX packet  Generic drug:  polyethylene glycol  Take 17 g by mouth at bedtime. 17 mg, Am dosage, 1/2 dose qhs     MUCINEX PO  Take by mouth. Expectorant, as needed     NORVASC 5 MG tablet  Generic drug:  amLODipine  Take 5 mg by mouth daily.     paricalcitol 1 MCG capsule  Commonly known as:  ZEMPLAR  Take 1 mcg by mouth daily. 2 tabs in am     predniSONE 10 MG tablet  Commonly known as:  DELTASONE  Take 10 mg by mouth daily. Reported on 06/06/2015     PRILOSEC 20 MG capsule  Generic drug:  omeprazole  Take 20 mg by mouth 2 (two) times daily.     RENAGEL 800 MG tablet  Generic drug:  sevelamer  Take 1,600 mg by mouth 3 (three) times daily with meals.     SINGULAIR 10 MG tablet  Generic drug:  montelukast  Take 10 mg by mouth at bedtime.     solifenacin 10 MG tablet  Commonly known as:  VESICARE  Take 1 tablet (10 mg total) by mouth daily.     STOOL SOFTENER PO  Take by mouth 2 (two) times daily. 2 regular in am , 1 with stimulant in pm     SYNTHROID 150 MCG tablet  Generic drug:  levothyroxine  Take 150 mcg by mouth daily. Am dosage     TOPROL XL 50 MG 24 hr tablet  Generic drug:  metoprolol succinate  Take 50 mg by mouth 2 (two) times daily. Take with or immediately following a meal.     ULORIC 80 MG Tabs  Generic drug:  Febuxostat  Take by mouth daily. Morning dosage        Allergies:  Allergies  Allergen Reactions  . Cinnamon     Other reaction(s): Unknown  . Procrit [Epoetin Alfa]     Other reaction(s): Other (See Comments) Seizures, grand mal Seizures, grand mal  . Ace Inhibitors Cough  . Azithromycin     Other reaction(s): Other (See Comments) seizures  . Neomycin-Bacitracin Zn-Polymyx     Other reaction(s): Unknown  . Sulfa Antibiotics  Other reaction(s): Other (See Comments) Due to risks for seizures.    Family History: Family History  Problem Relation Age of Onset  . Diabetes Mother   .  Lumbar disc disease Mother   . Migraines Mother   . Cancer - Prostate Father     mild    Social History:  reports that she has never smoked. She has never used smokeless tobacco. She reports that she does not drink alcohol or use illicit drugs.  ROS: UROLOGY Frequent Urination?: No Hard to postpone urination?: No Burning/pain with urination?: No Get up at night to urinate?: No Leakage of urine?: No Urine stream starts and stops?: No Trouble starting stream?: No Do you have to strain to urinate?: No Blood in urine?: No Urinary tract infection?: No Sexually transmitted disease?: No Injury to kidneys or bladder?: No Painful intercourse?: No Weak stream?: No Currently pregnant?: No Vaginal bleeding?: No Last menstrual period?: No  Gastrointestinal Nausea?: No Vomiting?: No Indigestion/heartburn?: No Diarrhea?: No Constipation?: No  Constitutional Fever: No Night sweats?: No Weight loss?: No Fatigue?: Yes  Skin Skin rash/lesions?: No Itching?: No  Eyes Blurred vision?: No Double vision?: No  Ears/Nose/Throat Sore throat?: No Sinus problems?: No  Hematologic/Lymphatic Swollen glands?: No Easy bruising?: No  Cardiovascular Leg swelling?: No Chest pain?: No  Respiratory Cough?: No Shortness of breath?: No  Endocrine Excessive thirst?: No  Musculoskeletal Back pain?: No Joint pain?: No  Neurological Headaches?: No Dizziness?: No  Psychologic Depression?: No Anxiety?: No  Physical Exam: BP 160/70 mmHg  Pulse 65  Ht 5' 2.5" (1.588 m)  Wt 204 lb (92.534 kg)  BMI 36.69 kg/m2  Constitutional: Well nourished. Alert and oriented, No acute distress. HEENT: [place] AT, moist mucus membranes. Trachea midline, no masses. Cardiovascular: No clubbing, cyanosis, or edema. Respiratory: Normal respiratory effort, no increased work of breathing. GI: Abdomen is soft, non tender, non distended, no abdominal masses. Liver and spleen not palpable.  No hernias  appreciated.  Stool sample for occult testing is not indicated.   GU: No CVA tenderness.  No bladder fullness or masses.  (Patient has her pelvic exams with Dr. Joline Salt every year)   Skin: No rashes, bruises or suspicious lesions. Lymph: No cervical or inguinal adenopathy. Neurologic: Grossly intact, no focal deficits, moving all 4 extremities. Psychiatric: Normal mood and affect.  Laboratory Data: Lab Results  Component Value Date   WBC 7.2 12/20/2014   HGB 7.6* 12/20/2014   HCT 23.2* 12/20/2014   MCV 103.6* 12/20/2014   PLT 186 12/20/2014    Lab Results  Component Value Date   CREATININE 5.44* 12/20/2014    Lab Results  Component Value Date   AST 14* 12/20/2014   Lab Results  Component Value Date   ALT 6* 12/20/2014     Pertinent Imaging: Results for YAHIRA, DEMERCHANT (MRN [id number]) as of 06/07/2015 14:42  Ref. Range 06/06/2015 10:27  Scan Result Unknown 24    Assessment & Plan:    1. OAB (overactive bladder):  Controlled with Vesicare 10 mg, every other day.    - Bladder Scan (Post Void Residual) in office  2. Incontinence:   Patient's urge incontinence is controlled with Vesicare.  She is taking Vesicare 10 mg every other day due to her CKD.    - solifenacin (VESICARE) 10 MG tablet; Take 1 tablet (10 mg total) by mouth daily.  Dispense: 90 tablet; Refill: 3   Return in about 1 year (around 06/05/2016) for PVR and symptom recheck.  These  notes generated with voice recognition software. I apologize for typographical errors.  Zara Council, [facility] [address] [place], [place] [address] [phone]

## 2015-06-07 DIAGNOSIS — R32 Unspecified urinary incontinence: Secondary | ICD-10-CM | POA: Insufficient documentation

## 2015-06-07 DIAGNOSIS — N3281 Overactive bladder: Secondary | ICD-10-CM | POA: Insufficient documentation

## 2015-06-12 ENCOUNTER — Telehealth: Payer: Self-pay

## 2015-06-12 NOTE — Telephone Encounter (Signed)
[employer] has approved the request for coverage on Tegretol XR effective until 06/11/2016 Ref # [id number]

## 2015-08-15 ENCOUNTER — Other Ambulatory Visit: Payer: Self-pay | Admitting: Neurology

## 2015-08-23 ENCOUNTER — Ambulatory Visit
Admission: RE | Admit: 2015-08-23 | Discharge: 2015-08-23 | Disposition: A | Discharge status: Home / Self Care | Payer: Medicare Other | Source: Ambulatory Visit | Attending: Vascular Surgery | Admitting: Vascular Surgery

## 2015-08-23 ENCOUNTER — Other Ambulatory Visit: Payer: Self-pay | Admitting: Vascular Surgery

## 2015-08-23 ENCOUNTER — Encounter
Admission: RE | Disposition: A | Discharge status: Home / Self Care | Payer: Self-pay | Source: Ambulatory Visit | Attending: Vascular Surgery

## 2015-08-23 DIAGNOSIS — Z882 Allergy status to sulfonamides status: Secondary | ICD-10-CM | POA: Diagnosis not present

## 2015-08-23 DIAGNOSIS — F319 Bipolar disorder, unspecified: Secondary | ICD-10-CM | POA: Diagnosis not present

## 2015-08-23 DIAGNOSIS — I517 Cardiomegaly: Secondary | ICD-10-CM | POA: Insufficient documentation

## 2015-08-23 DIAGNOSIS — J449 Chronic obstructive pulmonary disease, unspecified: Secondary | ICD-10-CM | POA: Diagnosis not present

## 2015-08-23 DIAGNOSIS — Z9049 Acquired absence of other specified parts of digestive tract: Secondary | ICD-10-CM | POA: Diagnosis not present

## 2015-08-23 DIAGNOSIS — R32 Unspecified urinary incontinence: Secondary | ICD-10-CM | POA: Insufficient documentation

## 2015-08-23 DIAGNOSIS — K219 Gastro-esophageal reflux disease without esophagitis: Secondary | ICD-10-CM | POA: Diagnosis not present

## 2015-08-23 DIAGNOSIS — Z91018 Allergy to other foods: Secondary | ICD-10-CM | POA: Diagnosis not present

## 2015-08-23 DIAGNOSIS — G4730 Sleep apnea, unspecified: Secondary | ICD-10-CM | POA: Insufficient documentation

## 2015-08-23 DIAGNOSIS — R569 Unspecified convulsions: Secondary | ICD-10-CM | POA: Insufficient documentation

## 2015-08-23 DIAGNOSIS — F419 Anxiety disorder, unspecified: Secondary | ICD-10-CM | POA: Diagnosis not present

## 2015-08-23 DIAGNOSIS — N186 End stage renal disease: Secondary | ICD-10-CM | POA: Diagnosis present

## 2015-08-23 DIAGNOSIS — D510 Vitamin B12 deficiency anemia due to intrinsic factor deficiency: Secondary | ICD-10-CM | POA: Diagnosis not present

## 2015-08-23 DIAGNOSIS — I6529 Occlusion and stenosis of unspecified carotid artery: Secondary | ICD-10-CM | POA: Insufficient documentation

## 2015-08-23 DIAGNOSIS — J45909 Unspecified asthma, uncomplicated: Secondary | ICD-10-CM | POA: Insufficient documentation

## 2015-08-23 DIAGNOSIS — R260 Ataxic gait: Secondary | ICD-10-CM | POA: Diagnosis not present

## 2015-08-23 DIAGNOSIS — Z881 Allergy status to other antibiotic agents status: Secondary | ICD-10-CM | POA: Insufficient documentation

## 2015-08-23 DIAGNOSIS — Z833 Family history of diabetes mellitus: Secondary | ICD-10-CM | POA: Insufficient documentation

## 2015-08-23 DIAGNOSIS — E785 Hyperlipidemia, unspecified: Secondary | ICD-10-CM | POA: Diagnosis not present

## 2015-08-23 DIAGNOSIS — N3281 Overactive bladder: Secondary | ICD-10-CM | POA: Insufficient documentation

## 2015-08-23 DIAGNOSIS — E042 Nontoxic multinodular goiter: Secondary | ICD-10-CM | POA: Insufficient documentation

## 2015-08-23 DIAGNOSIS — I120 Hypertensive chronic kidney disease with stage 5 chronic kidney disease or end stage renal disease: Secondary | ICD-10-CM | POA: Diagnosis present

## 2015-08-23 DIAGNOSIS — E1122 Type 2 diabetes mellitus with diabetic chronic kidney disease: Secondary | ICD-10-CM | POA: Diagnosis not present

## 2015-08-23 DIAGNOSIS — Z8489 Family history of other specified conditions: Secondary | ICD-10-CM | POA: Diagnosis not present

## 2015-08-23 DIAGNOSIS — Z992 Dependence on renal dialysis: Secondary | ICD-10-CM | POA: Insufficient documentation

## 2015-08-23 DIAGNOSIS — E0590 Thyrotoxicosis, unspecified without thyrotoxic crisis or storm: Secondary | ICD-10-CM | POA: Insufficient documentation

## 2015-08-23 DIAGNOSIS — Z888 Allergy status to other drugs, medicaments and biological substances status: Secondary | ICD-10-CM | POA: Insufficient documentation

## 2015-08-23 DIAGNOSIS — I519 Heart disease, unspecified: Secondary | ICD-10-CM | POA: Insufficient documentation

## 2015-08-23 DIAGNOSIS — Z8042 Family history of malignant neoplasm of prostate: Secondary | ICD-10-CM | POA: Insufficient documentation

## 2015-08-23 DIAGNOSIS — Z6835 Body mass index (BMI) 35.0-35.9, adult: Secondary | ICD-10-CM | POA: Insufficient documentation

## 2015-08-23 HISTORY — PX: PERIPHERAL VASCULAR CATHETERIZATION: SHX172C

## 2015-08-23 SURGERY — DIALYSIS/PERMA CATHETER INSERTION
Anesthesia: Moderate Sedation

## 2015-08-23 MED ORDER — ONDANSETRON HCL 4 MG/2ML IJ SOLN: 4.0000 mg | Freq: Four times a day (QID) | INTRAMUSCULAR | Status: DC | PRN

## 2015-08-23 MED ORDER — HEPARIN SODIUM (PORCINE) 10000 UNIT/ML IJ SOLN
INTRAMUSCULAR | Status: AC
  Filled 2015-08-23: qty 1

## 2015-08-23 MED ORDER — DEXTROSE 5 % IV SOLN
1.5000 g | INTRAVENOUS | Status: AC
  Administered 2015-08-23: 1.5 g via INTRAVENOUS

## 2015-08-23 MED ORDER — LIDOCAINE-EPINEPHRINE (PF) 1 %-1:200000 IJ SOLN
INTRAMUSCULAR | Status: AC
  Filled 2015-08-23: qty 30

## 2015-08-23 MED ORDER — FENTANYL CITRATE (PF) 100 MCG/2ML IJ SOLN
INTRAMUSCULAR | Status: AC
  Filled 2015-08-23: qty 2

## 2015-08-23 MED ORDER — MIDAZOLAM HCL 5 MG/5ML IJ SOLN
INTRAMUSCULAR | Status: AC
  Filled 2015-08-23: qty 5

## 2015-08-23 MED ORDER — SODIUM CHLORIDE FLUSH 0.9 % IV SOLN
INTRAVENOUS | Status: AC
  Filled 2015-08-23: qty 40

## 2015-08-23 MED ORDER — FENTANYL CITRATE (PF) 100 MCG/2ML IJ SOLN
INTRAMUSCULAR | Status: DC | PRN
  Administered 2015-08-23: 50 ug via INTRAVENOUS

## 2015-08-23 MED ORDER — SODIUM CHLORIDE 0.9 % IV SOLN
INTRAVENOUS | Status: DC
  Administered 2015-08-23: 09:00:00 via INTRAVENOUS

## 2015-08-23 MED ORDER — SODIUM CHLORIDE 0.9 % IV SOLN: INTRAVENOUS | Status: DC

## 2015-08-23 MED ORDER — HYDROMORPHONE HCL 1 MG/ML IJ SOLN: 1.0000 mg | Freq: Once | INTRAMUSCULAR | Status: DC

## 2015-08-23 MED ORDER — HEPARIN (PORCINE) IN NACL 2-0.9 UNIT/ML-% IJ SOLN
INTRAMUSCULAR | Status: AC
  Filled 2015-08-23: qty 500

## 2015-08-23 MED ORDER — MIDAZOLAM HCL 2 MG/2ML IJ SOLN
INTRAMUSCULAR | Status: DC | PRN
  Administered 2015-08-23: 2 mg via INTRAVENOUS

## 2015-08-23 MED ORDER — LIDOCAINE-EPINEPHRINE (PF) 1 %-1:200000 IJ SOLN
INTRAMUSCULAR | Status: DC | PRN
  Administered 2015-08-23: 20 mL via INTRADERMAL

## 2015-08-23 SURGICAL SUPPLY — 7 items
CATH PALINDROME RT-P 15FX19CM (CATHETERS) ×2 IMPLANT
DERMABOND ADVANCED (GAUZE/BANDAGES/DRESSINGS) ×1
DERMABOND ADVANCED .7 DNX12 (GAUZE/BANDAGES/DRESSINGS) ×1 IMPLANT
PACK ANGIOGRAPHY (CUSTOM PROCEDURE TRAY) ×2 IMPLANT
SUT MNCRL AB 4-0 PS2 18 (SUTURE) ×2 IMPLANT
SUT PROLENE 0 CT 1 30 (SUTURE) ×2 IMPLANT
TOWEL OR 17X26 4PK STRL BLUE (TOWEL DISPOSABLE) ×2 IMPLANT

## 2015-08-23 NOTE — Discharge Instructions (Signed)
, Care After °Refer to this sheet in the next few weeks. These instructions provide you with information on caring for yourself after your procedure. Your caregiver may also give you more specific instructions. Your treatment has been planned according to current medical practices, but problems sometimes occur. Call your caregiver if you have any problems or questions after your procedure.  °HOME CARE INSTRUCTIONS °· Rest at home the day of the procedure. You will likely be able to return to normal activities the following day. °· Follow your caregiver's specific instructions for the type of device that you have. °· Only take over-the-counter or prescription medicines as directed by your caregiver. °· Keep the insertion site of the catheter clean and dry at all times. °¨ Change the bandages (dressings) over the catheter site as directed by your caregiver. °¨ Wash the area around the catheter site during each dressing change. Sponge bathe the area using a germ-killing (antiseptic) solution as directed by your caregiver. °¨ Look for redness or swelling at the insertion site during each dressing change. °· Apply an antibiotic ointment as directed by your caregiver. °· Flush your catheter as directed to keep it from becoming clogged. °· Always wash your hands thoroughly before changing dressings or flushing the catheter. °· Do not let air enter the catheter. °¨ Never open the cap at the catheter tip. °¨ Always make sure there is no air in the syringe or in the tubing for infusions.    °· Do not lift anything heavy. °· Do not drive until your caregiver approves. °· Do not shower or bathe until your caregiver approves. When you shower or bathe, place a piece of plastic wrap over the catheter site. Do not allow the catheter site or the dressing to get wet. If taking a bath, do not allow the catheter to get submerged in the water. °If the catheter was inserted through an arm vein:  °· Avoid wearing tight clothes or jewelry  on the arm that has the catheter.   °· Do not sleep with your head on the arm that has the catheter.   °· Do not allow use of a blood pressure cuff on the arm that has the catheter.   °· Do not let anyone draw blood from the arm that has the catheter, except through the catheter itself. °SEEK MEDICAL CARE IF: °· You have bleeding at the insertion site of the catheter.   °· You feel weak or nauseous.   °· Your catheter is not working properly.   °· You have redness, pain, swelling, and warmth at the insertion site.   °· You notice fluid draining from the insertion site.   °SEEK IMMEDIATE MEDICAL CARE IF: °· Your catheter breaks or has a hole in it.   °· Your catheter comes loose or gets pulled completely out. If this happens, hold firm pressure over the area with your hand or a clean cloth.   °· You have a fever. °· You have chills.   °· Your catheter becomes totally blocked.   °· You have swelling in your arm, shoulder, neck, or face.   °· You have bleeding from the insertion site that does not stop.   °· You develop chest pain or have trouble breathing.   °· You feel dizzy or faint.   °MAKE SURE YOU: °· Understand these instructions. °· Will watch your condition. °· Will get help right away if you are not doing well or get worse. °  °This information is not intended to replace advice given to you by your health care provider. Make sure you discuss any questions you   have with your health care provider. °  °Document Released: 04/07/2012 Document Revised: 12/22/2012 Document Reviewed: 04/07/2012 °Elsevier Interactive Patient Education ©2016 [employer] Inc[employer] ° °

## 2015-08-23 NOTE — Op Note (Signed)
OPERATIVE NOTE    PRE-OPERATIVE DIAGNOSIS: 1. ESRD 2. Diabetes 3. Hypertension  POST-OPERATIVE DIAGNOSIS: same as above  PROCEDURE: 1. Ultrasound guidance for vascular access to the right internal jugular vein 2. Fluoroscopic guidance for placement of catheter 3. Placement of a 19 cm tip to cuff tunneled hemodialysis catheter via the right internal jugular vein  SURGEON: Leotis Pain, MD  ANESTHESIA:  Local with Moderate conscious sedation for approximately 25 minutes using 2 mg of Versed and 50 mcg of Fentanyl  ESTIMATED BLOOD LOSS: 40 cc  FLUORO TIME: 0.3 minutes  CONTRAST: 0 cc  FINDING(S): 1.  Patent right internal jugular vein  SPECIMEN(S):  None  INDICATIONS:   Nina Johnston is a 55 y.o. female who presents with progression of her renal failure to end-stage renal disease.  The patient needs long term dialysis access for their ESRD, and a Permcath is necessary.  Risks and benefits are discussed and informed consent is obtained.    DESCRIPTION: After obtaining full informed written consent, the patient was brought back to the vascular suited. The patient's right neck and chest were sterilely prepped and draped in a sterile surgical field was created. Moderate conscious sedation was administered during a face to face encounter with the patient throughout the procedure with my supervision of the RN administering medicines and monitoring the patient's vital signs, pulse oximetry, telemetry and mental status throughout from the start of the procedure until the patient was taken to the recovery room.  The right internal jugular vein was visualized with ultrasound and found to be patent. It was then accessed under direct ultrasound guidance and a permanent image was recorded. A wire was placed. After skin nick and dilatation, the peel-away sheath was placed over the wire. I then turned my attention to an area under the clavicle. Approximately 1-2 fingerbreadths below the clavicle a  small counterincision was created and tunneled from the subclavicular incision to the access site. Using fluoroscopic guidance, a 19 centimeter tip to cuff tunneled hemodialysis catheter was selected, and tunneled from the subclavicular incision to the access site. It was then placed through the peel-away sheath and the peel-away sheath was removed. Using fluoroscopic guidance the catheter tips were parked in the right atrium. The appropriate distal connectors were placed. It withdrew blood well and flushed easily with heparinized saline and a concentrated heparin solution was then placed. It was secured to the chest wall with 2 Prolene sutures. The access incision was closed single 4-0 Monocryl. A 4-0 Monocryl pursestring suture was placed around the exit site. Sterile dressings were placed. The patient tolerated the procedure well and was taken to the recovery room in stable condition.  COMPLICATIONS: None  CONDITION: Stable  Nina Johnston  08/23/2015, 10:24 AM

## 2015-08-23 NOTE — H&P (Signed)
[facility] Admission History & Physical  MRN : [id number]  Nina Johnston is a 55 y.o. (October 26, 1960) female who presents with chief complaint of No chief complaint on file. Marland Kitchen  History of Present Illness: Patient sent from her Nephrology group (Drs. Singh/Lateef/Kolluru) for placement of a dialysis access to initiate dialysis.  She has progressed to ESRD, and needs to start dialysis at this time.  No previous dialysis treatments or accesses.  Some increased fluid and shortness of breath with exertion.  No fever or chills.  Current Facility-Administered Medications  Medication Dose Route Frequency Provider Last Rate Last Dose  . 0.9 %  sodium chloride infusion   Intravenous Continuous Algernon Huxley, MD        Past Medical History  Diagnosis Date  . Morbid obesity ([place])   . Hypertension   . Family history of thyroid problem   . Anxiety and depression   . Diabetes mellitus without complication ([place])   . Seizures ([place])   . Heart disease     enlarged because of COPD  . Renal disease     w/ GFR 29-may be due to diabetes  . Bronchitis   . Asthma   . 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 Transient alteration of awareness 11/10/2012  . Concussion 01-14-14    Fall   . Urinary incontinence with continuous leakage 03/08/2014  . Post-concussion syndrome 03/08/2014  . HLD (hyperlipidemia)   . CKD (chronic kidney disease)   . Sleep apnea   . COPD (chronic obstructive pulmonary disease) ([place])   . Bipolar disorder ([place])   . Pernicious anemia   . GERD (gastroesophageal reflux disease)   . Multiple thyroid nodules   . Heart murmur   . Hyperthyroidism   . Hyperkalemia   . Diplopia   . OAB (overactive bladder)   . Benign essential HTN 12/26/2014  . Carotid artery narrowing 12/22/2014  . Essential (primary) hypertension 02/12/2015  . Acute bacterial  sinusitis 04/19/2014    Past Surgical History  Procedure Laterality Date  . Cholecystectomy    . Endometrial biopsy      ablation, uterine  . Avf      Social History Social History  Substance Use Topics  . Smoking status: Never Smoker   . Smokeless tobacco: Never Used  . Alcohol Use: No  No IVDU  Family History Family History  Problem Relation Age of Onset  . Diabetes Mother   . Lumbar disc disease Mother   . Migraines Mother   . Cancer - Prostate Father     mild  no bleeding or clotting disorders  Allergies  Allergen Reactions  . Cinnamon     Other reaction(s): Unknown  . Procrit [Epoetin Alfa]     Other reaction(s): Other (See Comments) Seizures, grand mal Seizures, grand mal  . Ace Inhibitors Cough  . Azithromycin     Other reaction(s): Other (See Comments) seizures  . Neomycin-Bacitracin Zn-Polymyx     Other reaction(s): Unknown  . Sulfa Antibiotics     Other reaction(s): Other (See Comments) Due to risks for seizures.     REVIEW OF SYSTEMS (Negative unless checked)  Constitutional: [] Weight loss  [] Fever  [] Chills Cardiac: [] Chest pain   [] Chest pressure   [] Palpitations   [] Shortness of breath when laying flat   [] Shortness of breath at rest   [  x]Shortness of breath with exertion. Vascular:  [] Pain in legs with walking   [] Pain in legs at rest   [] Pain in legs when laying flat   [] Claudication   [] Pain in feet when walking  [] Pain in feet at rest  [] Pain in feet when laying flat   [] History of DVT   [] Phlebitis   [x] Swelling in legs   [] Varicose veins   [] Non-healing ulcers Pulmonary:   [] Uses home oxygen   [] Productive cough   [] Hemoptysis   [] Wheeze  [] COPD   [] Asthma Neurologic:  [] Dizziness  [] Blackouts   [] Seizures   [] History of stroke   [] History of TIA  [] Aphasia   [] Temporary blindness   [] Dysphagia   [] Weakness or numbness in arms   [] Weakness or numbness in legs Musculoskeletal:  [] Arthritis   [] Joint swelling   [] Joint pain   [] Low back  pain Hematologic:  [] Easy bruising  [] Easy bleeding   [] Hypercoagulable state   [] Anemic  [] Hepatitis Gastrointestinal:  [] Blood in stool   [] Vomiting blood  [] Gastroesophageal reflux/heartburn   [] Difficulty swallowing. Genitourinary:  [x] Chronic kidney disease   [] Difficult urination  [] Frequent urination  [] Burning with urination   [] Blood in urine Skin:  [] Rashes   [] Ulcers   [] Wounds Psychological:  [] History of anxiety   []  History of major depression.  Physical Examination  Filed Vitals:   08/23/15 0903  BP: 148/71  Pulse: 74  Temp: 98.4 F (36.9 C)  TempSrc: Oral  Resp: 18  Height: 5\' 2"  (1.575 m)  Weight: 88.905 kg (196 lb)  SpO2: 100%   Body mass index is 35.84 kg/(m^2). Gen: WD/WN, NAD Head: [place]/AT, No temporalis wasting. Prominent temp pulse not noted. Ear/Nose/Throat: Hearing grossly intact, nares w/o erythema or drainage, oropharynx w/o Erythema/Exudate,  Eyes: PERRLA, EOMI.  Neck: Supple, no nuchal rigidity.  No JVD.  Pulmonary:  Good air movement, equal bilaterally, no use of accessory muscles.  Cardiac: RRR, normal S1, S2 Vascular:  Vessel Right Left  Radial Palpable Palpable                                   Gastrointestinal: soft, non-tender/non-distended. No guarding/reflex.  Musculoskeletal: M/S 5/5 throughout.  Extremities without ischemic changes.  No deformity or atrophy.  Neurologic: CN 2-12 intact. Pain and light touch intact in extremities.  Symmetrical.  Speech is fluent. Motor exam as listed above. Psychiatric: Judgment intact, Mood & affect appropriate for pt's clinical situation. Dermatologic: No rashes or ulcers noted.  No cellulitis or open wounds. Lymph : No Cervical, Axillary, or Inguinal lymphadenopathy.    CBC Lab Results  Component Value Date   WBC 7.2 12/20/2014   HGB 7.6* 12/20/2014   HCT 23.2* 12/20/2014   MCV 103.6* 12/20/2014   PLT 186 12/20/2014    BMET    Component Value Date/Time   NA 134* 12/20/2014 1659    NA 142 03/08/2014 1634   NA 141 07/04/2011 0747   K 5.0 12/20/2014 1659   K 4.6 07/04/2011 0747   CL 101 12/20/2014 1659   CL 109* 07/04/2011 0747   CO2 19* 12/20/2014 1659   CO2 20* 07/04/2011 0747   GLUCOSE 106* 12/20/2014 1659   GLUCOSE 166* 03/08/2014 1634   GLUCOSE 198* 07/04/2011 0747   BUN 110* 12/20/2014 1659   BUN 82* 03/08/2014 1634   BUN 65* 07/04/2011 0747   CREATININE 5.44* 12/20/2014 1659   CREATININE 1.95* 07/04/2011 0747   CALCIUM  8.1* 12/20/2014 1659   CALCIUM 8.6 07/04/2011 0747   GFRNONAA 8* 12/20/2014 1659   GFRNONAA 29* 07/04/2011 0747   GFRAA 9* 12/20/2014 1659   GFRAA 35* 07/04/2011 0747   CrCl cannot be calculated (Patient has no serum creatinine result on file.).  COAG Lab Results  Component Value Date   INR 1.0 07/04/2011    Radiology No results found.   Assessment/Plan 1. ESRD. Needs immediate dialysis access.  Place Permcath today.  2. DM. Stable. Likely an underlying cause of her renal disease. Strict control good for avoidance of atherosclerotic disease.  3. HTN. Stable. Likely an underlying cause of her renal disease.  Strict control good for avoidance of atherosclerotic disease.   Cherylin Waguespack, MD  08/23/2015 9:09 AM

## 2015-08-24 ENCOUNTER — Encounter: Payer: Self-pay | Admitting: Vascular Surgery

## 2015-08-24 DIAGNOSIS — N186 End stage renal disease: Secondary | ICD-10-CM | POA: Insufficient documentation

## 2015-08-24 DIAGNOSIS — Z992 Dependence on renal dialysis: Secondary | ICD-10-CM

## 2015-08-30 ENCOUNTER — Encounter: Payer: Self-pay | Admitting: Obstetrics and Gynecology

## 2015-09-10 ENCOUNTER — Telehealth: Payer: Self-pay | Admitting: Neurology

## 2015-09-10 ENCOUNTER — Other Ambulatory Visit: Payer: Self-pay | Admitting: Neurology

## 2015-09-10 NOTE — Telephone Encounter (Signed)
RX for klonopin was faxed to [facility]. Received a receipt of confirmation.  Attempted to call pt back at her home number but VM was not set up.

## 2015-09-10 NOTE — Telephone Encounter (Signed)
Patient requesting refill of clonazePAM (KLONOPIN) 1 MG tablet Pharmacy:[facility], [place]

## 2015-09-26 ENCOUNTER — Telehealth: Payer: Self-pay

## 2015-09-26 NOTE — Telephone Encounter (Signed)
Completed pa for lamictal. Should hear a determination in 3 business days.

## 2015-09-27 ENCOUNTER — Telehealth: Payer: Self-pay | Admitting: *Deleted

## 2015-09-27 NOTE — Telephone Encounter (Signed)
      Taken 25-MAY-17 at  9:40AM by JWT ------------------------------------------------------------ Caller [facility]   CID [id number]  Patient Nina Johnston       Pt's Dr [facility]      Area Code [id number]                                                            Disp:Y/N N If Y = C/B If No Response In 37minutes ============================================================

## 2015-09-27 NOTE — Telephone Encounter (Signed)
I spoke to [place].  She relayed that Lamictal brand is not covered.  Last filled 08-20-15.  Old PA expired 08-29-15.  I called and spoke to [place] with CS with [employer] and no auth required for brand name lamictal.  ID # had changed from previous, and this  Is [id number].  The cover my meds information is correct.   Dawn will call [employer]bs again and if anything needed will call me back.

## 2015-09-27 NOTE — Telephone Encounter (Signed)
Received from fax [employer] that no authorization required for lamictal.  09-27-15 .  [phone]

## 2015-09-27 NOTE — Telephone Encounter (Signed)
I called and spoke to [facility] at [facility].   Does not go thru.  Pt is taking Brand name lamictal.  The pa is for lamictal?  Will need to call [employer], dawn stated will try to call as well and call me back.

## 2015-09-28 ENCOUNTER — Telehealth: Payer: Self-pay | Admitting: Obstetrics and Gynecology

## 2015-09-28 MED ORDER — NYSTATIN 100000 UNIT/GM EX OINT: 1.0000 "application " | TOPICAL_OINTMENT | Freq: Two times a day (BID) | CUTANEOUS | Status: DC

## 2015-09-28 MED ORDER — TRIAMCINOLONE ACETONIDE 0.1 % EX OINT: 1.0000 "application " | TOPICAL_OINTMENT | Freq: Two times a day (BID) | CUTANEOUS | Status: DC

## 2015-09-28 NOTE — Telephone Encounter (Signed)
Nina Johnston called saying she needs a Rx of Myostatin(sp?) and Triamsinoa(sp?) due to having redness underneath her breasts for about a week. She said they're creams. She'd like a phone call regarding this.  Pt's ph# [phone] Thank you.

## 2015-09-28 NOTE — Telephone Encounter (Signed)
Pt aware med erx. 

## 2015-10-02 ENCOUNTER — Telehealth: Payer: Self-pay | Admitting: Neurology

## 2015-10-02 NOTE — Telephone Encounter (Signed)
Mother ask for call back to Home# [phone] or cell# [phone].

## 2015-10-02 NOTE — Telephone Encounter (Signed)
Tyara with [employer] is calling and states that the Rx Lamictal has been approved as of 10/02/15 for 1 year.

## 2015-10-02 NOTE — Telephone Encounter (Signed)
Spoke to pt's mother (per DPR). She says that [place] called her about 15 minutes ago and said that the lamictal was approved. We also received a call from [facility] confirming this. I also called [place], and they said that pt's lamictal did indeed go through.

## 2015-10-02 NOTE — Telephone Encounter (Signed)
Mother called back about brand name Lamictal, states daughter has to have brand name, states has been trying for over a week to get this taken care of, patient only has 3 more days of medication left.

## 2015-10-05 ENCOUNTER — Other Ambulatory Visit: Payer: Self-pay | Admitting: Internal Medicine

## 2015-10-05 DIAGNOSIS — Z1231 Encounter for screening mammogram for malignant neoplasm of breast: Secondary | ICD-10-CM

## 2015-10-10 ENCOUNTER — Ambulatory Visit (INDEPENDENT_AMBULATORY_CARE_PROVIDER_SITE_OTHER): Payer: Medicare Other | Admitting: Obstetrics and Gynecology

## 2015-10-10 ENCOUNTER — Encounter: Payer: Self-pay | Admitting: Obstetrics and Gynecology

## 2015-10-10 VITALS — BP 144/63 | HR 84 | Ht 68.0 in | Wt 197.4 lb

## 2015-10-10 DIAGNOSIS — Z0000 Encounter for general adult medical examination without abnormal findings: Secondary | ICD-10-CM | POA: Diagnosis not present

## 2015-10-10 DIAGNOSIS — R300 Dysuria: Secondary | ICD-10-CM | POA: Diagnosis not present

## 2015-10-10 DIAGNOSIS — Z01419 Encounter for gynecological examination (general) (routine) without abnormal findings: Secondary | ICD-10-CM

## 2015-10-10 DIAGNOSIS — N19 Unspecified kidney failure: Secondary | ICD-10-CM | POA: Diagnosis not present

## 2015-10-10 DIAGNOSIS — Z780 Asymptomatic menopausal state: Secondary | ICD-10-CM

## 2015-10-10 DIAGNOSIS — Z1211 Encounter for screening for malignant neoplasm of colon: Secondary | ICD-10-CM | POA: Diagnosis not present

## 2015-10-10 DIAGNOSIS — E669 Obesity, unspecified: Secondary | ICD-10-CM

## 2015-10-10 DIAGNOSIS — K429 Umbilical hernia without obstruction or gangrene: Secondary | ICD-10-CM

## 2015-10-10 LAB — POCT URINALYSIS DIPSTICK
Bilirubin, UA: NEGATIVE
Glucose, UA: NEGATIVE
Ketones, UA: NEGATIVE
Nitrite, UA: NEGATIVE
Protein, UA: 3
Spec Grav, UA: 1.015
Urobilinogen, UA: 0.2
pH, UA: 6

## 2015-10-10 NOTE — Patient Instructions (Signed)
1. No Pap smear needed 2. Mammogram ordered 3. Stool guaiac cards given for colon cancer screening 4. Referral to general surgery for evaluation of umbilical hernia ([place]ankar) 5. Return in 1 year for physical

## 2015-10-10 NOTE — Progress Notes (Signed)
ANNUAL PREVENTATIVE CARE GYN  ENCOUNTER NOTE  Subjective:       Nina Johnston is a 55 y.o. No obstetric history on file. female here for a routine annual gynecologic exam.  Current complaints: 1.  Umbilical hernia   Gynecologic History No LMP recorded. Patient has had an ablation. Contraception: post menopausal status Last Pap: Normal.07/2012 Last mammogram: 10/2014. Results were: normal  Obstetric History OB History  No data available    Past Medical History  Diagnosis Date  . Morbid obesity ([place])   . Hypertension   . Family history of thyroid problem   . Anxiety and depression   . Diabetes mellitus without complication ([place])   . Seizures ([place])   . Heart disease     enlarged because of COPD  . Bronchitis   . Asthma   . 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 Transient alteration of awareness 11/10/2012  . Concussion 01-14-14    Fall   . Urinary incontinence with continuous leakage 03/08/2014  . Post-concussion syndrome 03/08/2014  . HLD (hyperlipidemia)   . Sleep apnea   . COPD (chronic obstructive pulmonary disease) ([place])   . Bipolar disorder ([place])   . Pernicious anemia   . GERD (gastroesophageal reflux disease)   . Multiple thyroid nodules   . Heart murmur   . Hyperthyroidism   . Hyperkalemia   . Diplopia   . OAB (overactive bladder)   . Benign essential HTN 12/26/2014  . Carotid artery narrowing 12/22/2014  . Essential (primary) hypertension 02/12/2015  . Acute bacterial sinusitis 04/19/2014  . Renal disease     w/ GFR 29-may be due to diabetes  . CKD (chronic kidney disease)     Past Surgical History  Procedure Laterality Date  . Cholecystectomy    . Endometrial biopsy      ablation, uterine  . Avf    . Peripheral vascular catheterization N/A 08/23/2015    Procedure: Dialysis/Perma Catheter Insertion;  Surgeon: Algernon Huxley, MD;  Location: [facility];  Service: Cardiovascular;  Laterality: N/A;  Shanda Howells a cath revision      Current Outpatient Prescriptions on File Prior to Visit  Medication Sig Dispense Refill  . amLODipine (NORVASC) 5 MG tablet Take 5 mg by mouth daily.    Marland Kitchen gemfibrozil (LOPID) 600 MG tablet Take 600 mg by mouth 2 (two) times daily before a meal.    . aspirin 81 MG tablet Take 81 mg by mouth at bedtime as needed for pain.    . carbamazepine (TEGRETOL-XR) 100 MG 12 hr tablet Two tablets in the morning and one tablet in the evening. 270 tablet 3  . cefdinir (OMNICEF) 300 MG capsule Take 300 mg by mouth daily. Reported on 06/06/2015    . clonazePAM (KLONOPIN) 1 MG tablet Take 1 tablet (1 mg total) by mouth at bedtime. 30 tablet 1  . cyanocobalamin (,VITAMIN B-12,) 1000 MCG/ML injection Inject 1,000 mcg into the muscle every 30 (thirty) days.    Mariane Baumgarten Calcium (STOOL SOFTENER PO) Take by mouth 2 (two) times daily. 2 regular in am , 1 with stimulant in pm    . ethosuximide (ZARONTIN) 250 MG/5ML solution TAKE 5ML BY MOUTH 2 TIMES DAILY 474 mL 3  . Febuxostat (ULORIC) 80 MG TABS Take by mouth daily. Morning dosage    . FERROUS SULFATE PO  Take 325 mg by mouth. 2 tabs in am and 1 tab in pm    . fexofenadine (ALLEGRA) 180 MG tablet Take 180 mg by mouth daily. am    . Fluticasone-Salmeterol (ADVAIR DISKUS) 250-50 MCG/DOSE AEPB Inhale 1 puff into the lungs 2 (two) times daily.    . furosemide (LASIX) 40 MG tablet 40 mg 2 (two) times daily. Reported on 06/06/2015    . GuaiFENesin (MUCINEX PO) Take by mouth. Expectorant, as needed    . hydrALAZINE (APRESOLINE) 50 MG tablet Take 50 mg by mouth 2 (two) times daily.     Marland Kitchen lamoTRIgine (LAMICTAL) 200 MG tablet Take 1.5 tablets (300 mg total) by mouth 2 (two) times daily. Brand Medically Necessary 270 tablet 3  . lamoTRIgine (LAMICTAL) 25 MG tablet Take 1 tablet (25 mg total) by mouth daily. (Patient not taking: Reported on 06/06/2015) 240 tablet 3  . levothyroxine (SYNTHROID)  150 MCG tablet Take 150 mcg by mouth daily. Am dosage    . magnesium oxide (MAG-OX) 400 MG tablet Take 400 mg by mouth daily. am    . metoprolol succinate (TOPROL XL) 50 MG 24 hr tablet Take 50 mg by mouth 2 (two) times daily. Take with or immediately following a meal.    . montelukast (SINGULAIR) 10 MG tablet Take 10 mg by mouth at bedtime.    Marland Kitchen nystatin ointment (MYCOSTATIN) Apply 1 application topically 2 (two) times daily. 30 g 0  . omeprazole (PRILOSEC) 20 MG capsule Take 20 mg by mouth 2 (two) times daily.    . paricalcitol (ZEMPLAR) 1 MCG capsule Take 1 mcg by mouth daily. 2 tabs in am    . polyethylene glycol (MIRALAX) packet Take 17 g by mouth at bedtime. Reported on 08/23/2015    . predniSONE (DELTASONE) 10 MG tablet Take 10 mg by mouth daily. Reported on 06/06/2015    . sevelamer (RENAGEL) 800 MG tablet Take 1,600 mg by mouth 3 (three) times daily with meals. Reported on 08/23/2015    . sevelamer carbonate (RENVELA) 800 MG tablet Take 800 mg by mouth 3 (three) times daily with meals.    . solifenacin (VESICARE) 10 MG tablet Take 1 tablet (10 mg total) by mouth daily. 90 tablet 3  . triamcinolone ointment (KENALOG) 0.1 % Apply 1 application topically 2 (two) times daily. 30 g 0   No current facility-administered medications on file prior to visit.    Allergies  Allergen Reactions  . Cinnamon     Other reaction(s): Unknown  . Procrit [Epoetin Alfa]     Other reaction(s): Other (See Comments) Seizures, grand mal Seizures, grand mal  . Ace Inhibitors Cough  . Azithromycin     Other reaction(s): Other (See Comments) seizures  . Neomycin-Bacitracin Zn-Polymyx     Other reaction(s): Unknown  . Sulfa Antibiotics     Other reaction(s): Other (See Comments) Due to risks for seizures.    Social History   Social History  . Marital Status: Single    Spouse Name: N/A  . Number of Children: 0  . Years of Education:  college   Occupational History  . not employed    Social  History Main Topics  . Smoking status: Never Smoker   . Smokeless tobacco: Never Used  . Alcohol Use: No  . Drug Use: No  . Sexual Activity: Not Currently   Other Topics Concern  . Not on file   Social History Narrative   Patient is single and lives with her parents.  Patient is disabled.   Patient has a college education.   Patient is right- handed.   Patient drinks tea occasionally. 2 glasses of tea when they eat out.    Family History  Problem Relation Age of Onset  . Diabetes Mother   . Lumbar disc disease Mother   . Migraines Mother   . Hypertension Mother   . Cancer - Prostate Father     mild  . Diabetes Father   . Hypertension Father   . Breast cancer Neg Hx   . Ovarian cancer Neg Hx   . Colon cancer Neg Hx   . Heart disease Neg Hx     The following portions of the patient's history were reviewed and updated as appropriate: allergies, current medications, past family history, past medical history, past social history, past surgical history and problem list.  Review of Systems ROS Review of Systems - General ROS: negative for - chills, fatigue, fever, hot flashes, night sweats, weight gain.  POSITIVE-weight loss-65 pounds the past 3 years Psychological ROS: negative for - anxiety, decreased libido, depression, mood swings, physical abuse or sexual abuse Ophthalmic ROS: negative for - blurry vision, eye pain or loss of vision ENT ROS: negative for -  hearing change, visual changes or vocal changes.  POSITIVE-headaches,Renal Allergy and Immunology ROS: negative for - hives, itchy/watery eyes or seasonal allergies Hematological and Lymphatic ROS: negative for - bleeding problems, bruising, swollen lymph nodes Endocrine ROS: negative for - galactorrhea, hair pattern changes, hot flashes, malaise/lethargy, mood swings, palpitations, polydipsia/polyuria, skin changes, temperature intolerance  Breast ROS: negative for - new or changing breast lumps or nipple  discharge Respiratory ROS: negative for - cough or shortness of breath Cardiovascular ROS: negative for - chest pain, irregular heartbeat, palpitations or shortness of breath Gastrointestinal ROS: no abdominal pain, change in bowel habits, or black or bloody stools Genito-Urinary ROS: no dysuria, trouble voiding, or hematuria Musculoskeletal ROS: negative for - joint pain or joint stiffness Neurological ROS: negative for - bowel and bladder control changes Dermatological ROS: negative for rash and skin lesion changes   Objective:   BP 144/63 mmHg  Pulse 84  Ht 5\' 8"  (1.727 m)  Wt 197 lb 6.4 oz (89.54 kg)  BMI 30.02 kg/m2 CONSTITUTIONAL: Well-developed, well-nourished female in no acute distress.  PSYCHIATRIC: Normal mood and affect. Normal behavior. Normal judgment and thought content. [place]: Alert and oriented to person, place, and time. Normal muscle tone coordination. No cranial nerve deficit noted. HENT:  Normocephalic, atraumatic EYES: Conjunctivae and EOM are normal. No scleral icterus.  NECK: Normal range of motion, supple, no masses.  Normal thyroid. Port-A-Cath is present in right upper thorax SKIN: Skin is warm and dry. No rash noted. Not diaphoretic. No erythema. No pallor. CARDIOVASCULAR: Normal heart rate noted, regular rhythm, no murmur. RESPIRATORY: Clear to auscultation bilaterally. Effort and breath sounds normal, no problems with respiration noted. BREASTS: Symmetric in size. No masses, skin changes, nipple drainage, or lymphadenopathy. ABDOMEN: Soft, normal bowel sounds, no distention noted.  No tenderness, rebound or guarding. 10 cm x 6 cm umbilical hernia palpable, not incarcerated BLADDER: Normal PELVIC:  External Genitalia: Normal  BUS: Normal  Vagina: Normal; Good support  Cervix: Normal; No motion tenderness; cervix comes to within 3-4 cm introitus  Uterus: Normal; Midplane, normal size and shape, mobile, nontender  Adnexa: Normal; Nonpalpable,  nontender  RV: External Exam NormaI, No Rectal Masses and Normal Sphincter tone  MUSCULOSKELETAL: Normal range of motion. No tenderness.  No cyanosis, clubbing,  or edema.  2+ distal pulses. LYMPHATIC: No Axillary, Supraclavicular, or Inguinal Adenopathy.    Assessment:   Annual gynecologic examination 55 y.o. Contraception: post menopausal status Obesity 2 Problem List Items Addressed This Visit    None    Visit Diagnoses    Well woman exam    -  Primary    Screen for colon cancer        Relevant Orders    Fecal occult blood, imunochemical    Umbilical hernia, recurrence not specified        Relevant Orders    Ambulatory referral to General Surgery    Menopause        Renal failure        Obesity        Relevant Medications    glipiZIDE (GLUCOTROL) 5 MG tablet    Dysuria        Relevant Orders    Urine culture    POCT urinalysis dipstick (Completed)       Plan:  Pap: Not needed and Not done Mammogram: Ordered Stool Guaiac Testing:  Ordered Labs: primary care Routine preventative health maintenance measures emphasized: Exercise/Diet/Weight control, Tobacco Warnings and Alcohol/Substance use risks Return to [place], MD   Note: This dictation was prepared with Dragon dictation along with smaller phrase technology. Any transcriptional errors that result from this process are unintentional.

## 2015-10-11 ENCOUNTER — Encounter: Payer: Self-pay | Admitting: *Deleted

## 2015-10-12 LAB — URINE CULTURE

## 2015-10-15 ENCOUNTER — Telehealth: Payer: Self-pay

## 2015-10-15 ENCOUNTER — Ambulatory Visit: Payer: Medicare Other | Admitting: Neurology

## 2015-10-15 NOTE — Telephone Encounter (Signed)
I spoke to pt's mother (per DPR). I advised her that pt's appt today needs to be cancelled. Pt's mother is agreeable to pt coming in on 10/22/15 at 4:30. Pt's mother reports that pt has started dialysis so this appt is very important.

## 2015-10-19 ENCOUNTER — Ambulatory Visit: Payer: Medicare Other

## 2015-10-22 ENCOUNTER — Ambulatory Visit: Payer: Medicare Other | Admitting: General Surgery

## 2015-10-22 ENCOUNTER — Encounter: Payer: Self-pay | Admitting: Neurology

## 2015-10-22 ENCOUNTER — Ambulatory Visit (INDEPENDENT_AMBULATORY_CARE_PROVIDER_SITE_OTHER): Payer: Medicare Other | Admitting: Neurology

## 2015-10-22 VITALS — BP 138/62 | HR 80 | Resp 20 | Ht 68.0 in | Wt 197.0 lb

## 2015-10-22 DIAGNOSIS — R404 Transient alteration of awareness: Secondary | ICD-10-CM

## 2015-10-22 DIAGNOSIS — R4789 Other speech disturbances: Secondary | ICD-10-CM | POA: Diagnosis not present

## 2015-10-22 DIAGNOSIS — R270 Ataxia, unspecified: Secondary | ICD-10-CM | POA: Diagnosis not present

## 2015-10-22 MED ORDER — ETHOSUXIMIDE 250 MG/5ML PO SOLN: ORAL | Status: DC

## 2015-10-22 MED ORDER — CLONAZEPAM 1 MG PO TABS
1.0000 mg | ORAL_TABLET | Freq: Every day | ORAL | Status: DC
Start: 2015-10-22 — End: 2016-05-09

## 2015-10-22 MED ORDER — CARBAMAZEPINE ER 100 MG PO TB12: ORAL_TABLET | ORAL | Status: DC

## 2015-10-22 NOTE — Progress Notes (Signed)
[facility]   Provider:  Dr Kervin Bones Referring Provider: / Primary Care Physician:  Idelle Crouch, MD  Recent HA- chief complaint.   HPI:  Nina Johnston is a 55 y.o. female here for a RV :  Routine Rv for refills. Patient is in the process for a renal transplant at [facility]. Med Record number :  [id number]  Nina Johnston renal function has declined over the last 3-1/2 years. She is not considered end chronic renal disease stage III. She developed anemia.  Her diabetes is better controlled and her HTN is the main factor in her ESRD. She goes twice at night to the bathroom.  She doesn't report diaphoresis, no falls and no dizziness. She had a spell of frequent headaches.  Note contact at [facility]; transplant. Dr . Talmadge Coventry,  Katheran Awe - [place] [id number].    This  55 year old, Caucasian  right-handed , single female has been followed in this practice since 2009.  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  She had such spells in August 2010 and September 2011 after carbamazepine was changed to a generic form of Carbatrol her blood levels were lower,  she also developed upper respiratory tract infections and pneumonia in 2011 and was unable for a while to use her CPAP which had been initiated in [place] for the treatment of obstructive sleep apnea. Paroxysmal events with mental status changes had continued,  03/17/2011  and 20 13 some of the spells are described as " automatisms"- continuing behavior while  the patient is staring off and acting without  reflecting not being  responsive to stimulation from the outside . After the spell passes, she became  very sleepy each time.  In August 2012 she was finally referred to an endocrinologist at [facility] after she had a hypoglycemic seizure and her diabetes medications were reduced, now her blood sugars are running between 150 and 200 in the morning,  fasting.Ms. Leonides Johnston begun working out in 2013 with a Physiological scientist and was able to lose about 30 pounds . She returned to using her CPAP.her Hb A1 C. in October of last year was 6.1- she had no other hypoglycemic events reported since March 2013 . Also in 2013 she had to visit the local ER twice for abdominal spasms and cardiac / chest pain. negative work up. She was developing renal failure.  She has since been followed for a decreased renal clearance by internal medicine and nephrology. The patient is treated for her spells  and remains on Tegretol. It she will need blood levels for her anti-epiletic medications. Dr. Geryl Councilman is debating to reduce her Klonopin , which may help reduce her fall risk as well. Vit D deficiency- takes supplement.    Revisit from 09-06-14 Nina Johnston has been on a renal diet and has lost 45 pounds. She was finally evaluated and a AV fistula placed earlier this year. She has lost little bit of grip strength in the left hand but otherwise is doing well. Looking at her [facility] laboratory results creatinine was 4.6 calcium was 8.6 phosphorus was 5.6. Albumin was normal. Hematocrit was 7.9 she does have hyperparathyroidism at 202 her thyroid hormone units. Iron ferritin were normal  range. He continues to take phosphate binding medication 3 times a day. Also looked at her red blood cell count today is 2.67 as of November last year. Her creatinine was 4.11 as of November last year when Tested in our lab at [place] her medication list. She states on the medical Toprol and Tegretol. She still on ethosuximide. The patient just resumed driving last year and for this reason I will leave her on 3 antiepileptic drugs also clinically I would  be leaning towards weaning her off ethosuximide. It may be best to have either the transplant or the hemodialysis started and the metabolic panel stabilized before changing. I will today refill her Klonopin, ethosuximide, Tegretol and Lamictal   Revisit from 02-12-15; Nina Johnston's renal function has further declined since her last visit, and I reviewed her [facility] laboratories today which were dated 01/05/2015. Her hemoglobin was 8.1 and hematocrit is 25.1 and creatinine is 5.6 phosphorous 4 is 6.5 and BUN is 108. She also had a potassium level of 5.1 ferritin was 183 total iron binding capacity was 326 the mean platelet volume is decreased to 12.2 the mean red blood cell volume is 106. She is clearly iron deficient. Since Nina Johnston also suffers from a seizure disorder and severe diabetes and I have also followed her for a sleep apnea, I'm aware of the overlapping complications between medication and her neurologic dysfunctions. Procrit appeared to have lowered her seizure threshold (she was given this medication in2012 ) and she is safer to  resume iron infusions with her nephrologist.  However, she was told that repeated iron infusions may render her less able to accept a new kidney or increase her risk of rejection of the transplanted organ. Her second AV fistula for dialysis access was placed July 26th. The first one matured but never had enough flow.   If possible I would like for her to try Epogen (instead of procrit) -  it may affect her differently and I think that we can increase her Tegretol level, but remain on Lamictal and ethosuximide at the current doses.   I want to do this as long as the patient will need a erythropoietin-based medication to avoid lowering the seizure threshold.  She also appears by her skin turgor quite dehydrated today- and she does have a slightly bronzed skin of the renal insufficiency patient. She is needing to progress to haemo-dialysis while waiting. Needs Vit  D and B 12 supplements.    10-22-2015, Patient is here today separately having had to major renovations to her material venous fistula with dialysis access in the left arm, her nondominant side. Laboratory shows albumin to be 4.0, hemoglobin to be 7.1, calcium corrected 9.1 phosphorus 5.3 PTH 466, potassium 4.9 dialysis volume and speed 2.12. Her blood glucose levels fasting in the morning unfortunately a poorly controlled and up almost every day to 232 250 level. She will discuss this with her primary care provider soon. The patient also asked me to refill her seizure medication, she will need Klonopin 1 mg at bedtime, Tegretol-XR 100 mg 2 in the morning one at bedtime, Lamictal 200 mg taking 1-1/2 tablets in a.m. and p.m. 300 twice a day. Using GLAXO name brand only. dis continued to use Calcitrol, magnesium oxide, ferrous sulfate, sodium bicarbonate. during appointments at the dialysis center she also receives iron infusions on Tuesdays.    Review of Systems: Out of a complete 14 system review, the patient complains of only the following symptoms:  Decreasing renal clearance . Recurrent convulsions due to hypoglycemia.  No driving allowed.   High fall risk @10  points, none by age , 5 from medications.      Social History   Social History  . Marital Status: Single    Spouse Name: N/A  . Number of Children: 0  . Years of Education:  college   Occupational History  . not employed    Social History Main Topics  . Smoking status: Never Smoker   . Smokeless tobacco: Never Used  . Alcohol Use: No  . Drug Use: No  . Sexual Activity: Not Currently   Other Topics Concern  . Not on file   Social History Narrative   Patient is single and lives with her parents.   Patient is disabled.   Patient has a college education.   Patient is right- handed.   Patient drinks tea occasionally. 2 glasses of tea when they eat out.    Family History  Problem Relation Age of Onset  . Diabetes  Mother   . Lumbar disc disease Mother   . Migraines Mother   . Hypertension Mother   . Cancer - Prostate Father     mild  . Diabetes Father   . Hypertension Father   . Breast cancer Neg Hx   . Ovarian cancer Neg Hx   . Colon cancer Neg Hx   . Heart disease Neg Hx     Past Medical History  Diagnosis Date  . Morbid obesity ([place])   . Hypertension   . Family history of thyroid problem   . Anxiety and depression   . Diabetes mellitus without complication ([place])   . Seizures ([place])   . Heart disease     enlarged because of COPD  . Bronchitis   . Asthma   . 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 Transient alteration of awareness 11/10/2012  . Concussion 01-14-14    Fall   . Urinary incontinence with continuous leakage 03/08/2014  . Post-concussion syndrome 03/08/2014  . HLD (hyperlipidemia)   . Sleep apnea   . COPD (chronic obstructive pulmonary disease) ([place])   . Bipolar disorder ([place])   . Pernicious anemia   . GERD (gastroesophageal reflux disease)   . Multiple thyroid nodules   . Heart murmur   . Hyperthyroidism   . Hyperkalemia   . Diplopia   . OAB (overactive bladder)   . Benign essential HTN 12/26/2014  . Carotid artery narrowing 12/22/2014  . Essential (primary) hypertension 02/12/2015  . Acute bacterial sinusitis 04/19/2014  . Renal disease     w/ GFR 29-may be due to diabetes  . CKD (chronic kidney disease)     Past Surgical History  Procedure Laterality Date  . Cholecystectomy    . Endometrial biopsy      ablation, uterine  . Avf    . Peripheral vascular catheterization N/A 08/23/2015    Procedure: Dialysis/Perma Catheter Insertion;  Surgeon: Algernon Huxley, MD;  Location: [facility];  Service: Cardiovascular;  Laterality: N/A;  Shanda Howells a cath revision      Current Outpatient Prescriptions  Medication Sig Dispense Refill  . amLODipine  (NORVASC) 5 MG tablet Take 5 mg by mouth daily.    Marland Kitchen aspirin 81 MG tablet Take 81 mg by mouth at bedtime as needed for pain.    Marland Kitchen  carbamazepine (TEGRETOL-XR) 100 MG 12 hr tablet Two tablets in the morning and one tablet in the evening. 270 tablet 3  . cefdinir (OMNICEF) 300 MG capsule Take 300 mg by mouth daily. Reported on 06/06/2015    . clonazePAM (KLONOPIN) 1 MG tablet Take 1 tablet (1 mg total) by mouth at bedtime. 30 tablet 1  . cyanocobalamin (,VITAMIN B-12,) 1000 MCG/ML injection Inject 1,000 mcg into the muscle every 30 (thirty) days.    Mariane Baumgarten Calcium (STOOL SOFTENER PO) Take by mouth 2 (two) times daily. 2 regular in am , 1 with stimulant in pm    . ethosuximide (ZARONTIN) 250 MG/5ML solution TAKE 5ML BY MOUTH 2 TIMES DAILY 474 mL 3  . Febuxostat (ULORIC) 80 MG TABS Take by mouth daily. Morning dosage    . FERROUS SULFATE PO Take 325 mg by mouth. 2 tabs in am and 1 tab in pm    . fexofenadine (ALLEGRA) 180 MG tablet Take 180 mg by mouth daily. am    . Fluticasone-Salmeterol (ADVAIR DISKUS) 250-50 MCG/DOSE AEPB Inhale 1 puff into the lungs 2 (two) times daily.    . furosemide (LASIX) 40 MG tablet 40 mg 2 (two) times daily. Reported on 06/06/2015    . gemfibrozil (LOPID) 600 MG tablet Take 600 mg by mouth 2 (two) times daily before a meal.    . glipiZIDE (GLUCOTROL) 5 MG tablet     . GuaiFENesin (MUCINEX PO) Take by mouth. Expectorant, as needed    . hydrALAZINE (APRESOLINE) 50 MG tablet Take 50 mg by mouth 2 (two) times daily.     Marland Kitchen lamoTRIgine (LAMICTAL) 200 MG tablet Take 1.5 tablets (300 mg total) by mouth 2 (two) times daily. Brand Medically Necessary 270 tablet 3  . levothyroxine (SYNTHROID) 150 MCG tablet Take 150 mcg by mouth daily. Am dosage    . metoprolol succinate (TOPROL XL) 50 MG 24 hr tablet Take 50 mg by mouth 2 (two) times daily. Take with or immediately following a meal.    . montelukast (SINGULAIR) 10 MG tablet Take 10 mg by mouth at bedtime.    Marland Kitchen omeprazole  (PRILOSEC) 20 MG capsule Take 20 mg by mouth 2 (two) times daily.    . paricalcitol (ZEMPLAR) 1 MCG capsule Take 1 mcg by mouth daily. 2 tabs in am    . polyethylene glycol (MIRALAX) packet Take 17 g by mouth at bedtime. Reported on 08/23/2015    . predniSONE (DELTASONE) 10 MG tablet Take 10 mg by mouth daily. Reported on 06/06/2015    . sevelamer carbonate (RENVELA) 800 MG tablet Take 800 mg by mouth 3 (three) times daily with meals.    . solifenacin (VESICARE) 10 MG tablet Take 1 tablet (10 mg total) by mouth daily. 90 tablet 3   No current facility-administered medications for this visit.    Allergies as of 10/22/2015 - Review Complete 10/22/2015  Allergen Reaction Noted  . Cinnamon  11/09/2012  . Procrit [epoetin alfa]  11/10/2012  . Ace inhibitors Cough 09/06/2014  . Azithromycin  09/06/2014  . Neomycin-bacitracin zn-polymyx  09/06/2014  . Sulfa antibiotics  09/06/2014    Vitals: BP 138/62 mmHg  Pulse 80  Resp 20  Ht 5\' 8"  (1.727 m)  Wt 197 lb (89.359 kg)  BMI 29.96 kg/m2 Last Weight:  Wt Readings from Last 1 Encounters:  10/22/15 197 lb (89.359 kg)   Last Height:   Ht Readings from Last 1 Encounters:  10/22/15 5\' 8"  (1.727 m)   Vision  Screening:   Physical exam:  General: The patient is awake, alert and appears not in acute distress. The patient is well groomed. Head: Normocephalic, atraumatic. Neck is supple.  Mallampati 1 - there is no uvula , neck circumference:15.5 inches.   On renal low potassium diet, she lost 45 pounds .  Cardiovascular:  Regular rate and rhythm, without  murmurs or carotid bruit, and without distended neck veins. Respiratory: Lungs are clear to auscultation. Skin: ankle  edema, no rash. Looking jaundiced, bronzed .  Dry skin, low TURGOR, appears dehydrated and dusky. .  Trunk: BMI is morbidly obese-  patient has normal posture.   She has lost weight.   Neurologic exam : The patient is awake and alert, oriented to place and time. Memory  subjective described as intact.  There is a normal attention span & concentration ability. Speech is fluent without dysarthria or aphasia. Mood and affect are appropriate. Cranial nerves: Pupils are equal and briskly reactive to light.  Visual fields by finger perimetry are intact.Hearing to finger rub intact. Facial sensation intact to fine touch. Facial motor strength is symmetric and tongue moves midline.Motor exam:  Normal tone and normal muscle bulk, obesity related attenuated DTR , and symmetric normal strength in all extremities. Sensory:  Fine touch, pinprick and vibration were tested in all extremities and are presents in toes and feet. Proprioception is tested and normal. Coordination: Rapid alternating movements in the fingers/hands is tested and normal. Finger-to-nose maneuver tested and normal without evidence of ataxia, dysmetria or tremor. Gait and station: Patient walks without assistive device and is very slow -  Appears truncally rigid, with no lumbar rotation, turns with 5 steps, and has reduced step width. Strength within normal limits. Stance is stable and normal. Tandem gait is fragmented. Deep tendon reflexes: in the upper and lower extremities are symmetric and intact. Babinski maneuver response is down going on the left and equivocal on the right.  Assessment:    Plan:   See labs.   Her diabetes is poorly controlled at this point - she has not had hypoglycemic events which has had for the past sometimes look like convulsive seizures .had slurred speech and became incoordinated.  She continues to be on antiepileptic medication which I will refill today. We have discussed her recent labs I have not seen her creatinine level, but rising blood sugars that she tests 4 in the morning, and her overall albumin, hemoglobin and parathyroid hormone levels were good.Her HTN is improving. Insomnia: She takes 1 mg klonopin at bed time.  Her renal function is further declining, she has an AV  - fistula placed ( January 4th/ July 23 rd 2016 was the second) , and there is a sufficient thrill over the fistula.  She is evaluated for a possible donor organ. She started [facility] this April 15th, day before Easter.  Refills :    Rv every 6 month alternating between NP and me. Megan and Dr. Brett Fairy.  Refills for Lamictal, Tegretol increased, Ethosuximide, Klonopin  . She has seen PCP Dr. Fulton Reek, [facility].    Abdallah Hern, MD

## 2015-10-23 ENCOUNTER — Telehealth: Payer: Self-pay

## 2015-10-23 NOTE — Telephone Encounter (Signed)
I called pt. Spoke to pt's mother, per DPR. I advised her that Dr. Brett Fairy is requesting a 6 month follow up with the pt. A follow up was scheduled for 04/16/16 at 11:00. Pt's mother has questions regarding the migraine study conducted here. She is asking questions for a family member.  I advised pt's mother that I would give our research coordinators the home phone number and they can call pt's mother to discuss. Pt's mother verbalized understanding.

## 2015-10-31 ENCOUNTER — Other Ambulatory Visit: Payer: Self-pay | Admitting: Vascular Surgery

## 2015-10-31 ENCOUNTER — Ambulatory Visit (INDEPENDENT_AMBULATORY_CARE_PROVIDER_SITE_OTHER): Payer: Medicare Other | Admitting: Obstetrics and Gynecology

## 2015-10-31 ENCOUNTER — Encounter: Payer: Self-pay | Admitting: Obstetrics and Gynecology

## 2015-10-31 VITALS — BP 142/72 | HR 83 | Ht 68.0 in | Wt 195.1 lb

## 2015-10-31 DIAGNOSIS — Z124 Encounter for screening for malignant neoplasm of cervix: Secondary | ICD-10-CM | POA: Diagnosis not present

## 2015-10-31 NOTE — Addendum Note (Signed)
Addended by: Elouise Munroe on: 10/31/2015 12:18 PM   Modules accepted: Orders

## 2015-10-31 NOTE — Patient Instructions (Signed)
1. Pap smear is completed per her request per protocol for being on kidney transplant registry 2. Follow-up as needed

## 2015-10-31 NOTE — Progress Notes (Signed)
Chief complaint: 1. Pap smear 2. Renal transplant list  Patient reports need for screening Pap smear because of being on the renal transplant list. Previous Pap smear was 2014. Patient is low risk because of no history of abnormal Pap smears, no history of recent sexual activity, no history of HPV, and no symptomatology of abnormal uterine bleeding or pelvic pain.  OBJECTIVE: BP 142/72 mmHg  Pulse 83  Ht 5\' 8"  (1.727 m)  Wt 195 lb 1.6 oz (88.497 kg)  BMI 29.67 kg/m2 PELVIC: External Genitalia: Normal BUS: Normal Vagina: Normal; Good support Cervix: Normal; No motion tenderness; cervix comes to within 3-4 cm introitus; nulliparous; no lesions; Pap smear is completed  ASSESSMENT: 1. Renal transplant list 2. Pap smear requirement fulfilled today  PLAN: 1. Pap smear 2. Return when necessary  Brayton Mars, MD  Note: This dictation was prepared with Dragon dictation along with smaller phrase technology. Any transcriptional errors that result from this process are unintentional.

## 2015-11-05 ENCOUNTER — Other Ambulatory Visit: Payer: Self-pay | Admitting: Vascular Surgery

## 2015-11-07 LAB — PAP IG W/ RFLX HPV ASCU: PAP Smear Comment: 0

## 2015-11-09 ENCOUNTER — Encounter
Admission: RE | Disposition: A | Discharge status: Home / Self Care | Payer: Self-pay | Source: Ambulatory Visit | Attending: Vascular Surgery

## 2015-11-09 ENCOUNTER — Ambulatory Visit
Admission: RE | Admit: 2015-11-09 | Discharge: 2015-11-09 | Disposition: A | Discharge status: Home / Self Care | Payer: Medicare Other | Source: Ambulatory Visit | Attending: Vascular Surgery | Admitting: Vascular Surgery

## 2015-11-09 DIAGNOSIS — T829XXA Unspecified complication of cardiac and vascular prosthetic device, implant and graft, initial encounter: Secondary | ICD-10-CM | POA: Diagnosis not present

## 2015-11-09 DIAGNOSIS — Z8342 Family history of familial hypercholesterolemia: Secondary | ICD-10-CM | POA: Diagnosis not present

## 2015-11-09 DIAGNOSIS — Z9049 Acquired absence of other specified parts of digestive tract: Secondary | ICD-10-CM | POA: Diagnosis not present

## 2015-11-09 DIAGNOSIS — Z79899 Other long term (current) drug therapy: Secondary | ICD-10-CM | POA: Diagnosis not present

## 2015-11-09 DIAGNOSIS — N189 Chronic kidney disease, unspecified: Secondary | ICD-10-CM | POA: Insufficient documentation

## 2015-11-09 DIAGNOSIS — Z823 Family history of stroke: Secondary | ICD-10-CM | POA: Diagnosis not present

## 2015-11-09 DIAGNOSIS — M7989 Other specified soft tissue disorders: Secondary | ICD-10-CM | POA: Insufficient documentation

## 2015-11-09 DIAGNOSIS — Z833 Family history of diabetes mellitus: Secondary | ICD-10-CM | POA: Diagnosis not present

## 2015-11-09 DIAGNOSIS — Y832 Surgical operation with anastomosis, bypass or graft as the cause of abnormal reaction of the patient, or of later complication, without mention of misadventure at the time of the procedure: Secondary | ICD-10-CM | POA: Diagnosis not present

## 2015-11-09 DIAGNOSIS — Z888 Allergy status to other drugs, medicaments and biological substances status: Secondary | ICD-10-CM | POA: Diagnosis not present

## 2015-11-09 DIAGNOSIS — Z8249 Family history of ischemic heart disease and other diseases of the circulatory system: Secondary | ICD-10-CM | POA: Insufficient documentation

## 2015-11-09 DIAGNOSIS — E1122 Type 2 diabetes mellitus with diabetic chronic kidney disease: Secondary | ICD-10-CM | POA: Insufficient documentation

## 2015-11-09 DIAGNOSIS — J449 Chronic obstructive pulmonary disease, unspecified: Secondary | ICD-10-CM | POA: Diagnosis not present

## 2015-11-09 DIAGNOSIS — Z992 Dependence on renal dialysis: Secondary | ICD-10-CM | POA: Insufficient documentation

## 2015-11-09 DIAGNOSIS — I120 Hypertensive chronic kidney disease with stage 5 chronic kidney disease or end stage renal disease: Secondary | ICD-10-CM | POA: Diagnosis not present

## 2015-11-09 HISTORY — PX: PERIPHERAL VASCULAR CATHETERIZATION: SHX172C

## 2015-11-09 SURGERY — DIALYSIS/PERMA CATHETER REMOVAL
Anesthesia: Moderate Sedation

## 2015-11-09 MED ORDER — BACITRACIN-NEOMYCIN-POLYMYXIN 400-5-5000 EX OINT
TOPICAL_OINTMENT | CUTANEOUS | Status: AC
  Filled 2015-11-09: qty 1

## 2015-11-09 SURGICAL SUPPLY — 3 items
FORCEPS HALSTEAD CVD 5IN STRL (INSTRUMENTS) ×2 IMPLANT
TOWEL OR 17X26 4PK STRL BLUE (TOWEL DISPOSABLE) ×2 IMPLANT
TRAY LACERAT/PLASTIC (MISCELLANEOUS) ×2 IMPLANT

## 2015-11-09 NOTE — Discharge Instructions (Signed)
Incision Care °An incision is when a surgeon cuts into your body. After surgery, the incision needs to be cared for properly to prevent infection.  °HOW TO CARE FOR YOUR INCISION °· Take medicines only as directed by your health care provider. °· There are many different ways to close and cover an incision, including stitches, skin glue, and adhesive strips. Follow your health care provider's instructions on: °¨ Incision care. °¨ Bandage (dressing) changes and removal. °¨ Incision closure removal. °· Do not take baths, swim, or use a hot tub until your health care provider approves. You may shower as directed by your health care provider. °· Resume your normal diet and activities as directed. °· Use anti-itch medicine (such as an antihistamine) as directed by your health care provider. The incision may itch while it is healing. Do not pick or scratch at the incision. °· Drink enough fluid to keep your urine clear or pale yellow. °SEEK MEDICAL CARE IF:  °· You have drainage, redness, swelling, or pain at your incision site. °· You have muscle aches, chills, or a general ill feeling. °· You notice a bad smell coming from the incision or dressing. °· Your incision edges separate after the sutures, staples, or skin adhesive strips have been removed. °· You have persistent nausea or vomiting. °· You have a fever. °· You are dizzy. °SEEK IMMEDIATE MEDICAL CARE IF:  °· You have a rash. °· You faint. °· You have difficulty breathing. °MAKE SURE YOU:  °· Understand these instructions. °· Will watch your condition. °· Will get help right away if you are not doing well or get worse. °  °This information is not intended to replace advice given to you by your health care provider. Make sure you discuss any questions you have with your health care provider. °  °Document Released: 11/08/2004 Document Revised: 05/12/2014 Document Reviewed: 06/15/2013 °Elsevier Interactive Patient Education ©2016 Elsevier Inc. ° °

## 2015-11-09 NOTE — H&P (Signed)
[facility] History & Physical Update  The patient was interviewed and re-examined.  The patient's previous History and Physical has been reviewed and is unchanged.  There is no change in the plan of care. We plan to proceed with the scheduled procedure.  Nina Johnston, Dolores Lory, MD  11/09/2015, 2:30 PM

## 2015-11-09 NOTE — Op Note (Signed)
  OPERATIVE NOTE   PROCEDURE: 1. Removal of a right IJ tunneled dialysis catheter  PRE-OPERATIVE DIAGNOSIS: Complication of dialysis catheter, End stage renal disease  POST-OPERATIVE DIAGNOSIS: Same  SURGEON: Schnier, Dolores Lory, M.D.  ANESTHESIA: Local anesthetic with 1% lidocaine with epinephrine   ESTIMATED BLOOD LOSS: Minimal   FINDING(S): 1. Catheter intact   SPECIMEN(S):  Catheter  INDICATIONS:   Nina Johnston is a 55 y.o. female who presents with functioning left arm access and nonfunctioning right IJ catheter.  The patient has undergone placement of an extremity access which is working and this has been successfully cannulated without difficulty.  therefore is undergoing removal of his tunneled catheter which is no longer needed to avoid septic complications.   DESCRIPTION: After obtaining full informed written consent, the patient was positioned supine. The right IJ catheter and surrounding area is prepped and draped in a sterile fashion. The cuff was localized by palpation and noted to be less than 3 cm from the exit site. After appropriate timeout is called, 1% lidocaine with epinephrine is infiltrated into the surrounding tissues around the cuff. Small transverse incision is created at the exit site with an 11 blade scalpel and the dissection was carried up along the catheter to expose the cuff of the tunneled catheter.  The catheter cuff is then freed from the surrounding attachments and adhesions. Once the catheter has been freed circumferentially it is removed in 1 piece. Light pressure was held at the base of the neck.   Antibiotic ointment and a sterile dressing is applied to the exit site. Patient tolerated procedure well and there were no complications.  COMPLICATIONS: None  CONDITION: Unchanged  Schnier, Dolores Lory, M.D. [facility] Office: [phone]  11/09/2015,2:30 PM

## 2015-11-12 ENCOUNTER — Encounter: Payer: Self-pay | Admitting: Vascular Surgery

## 2015-11-23 ENCOUNTER — Telehealth: Payer: Self-pay | Admitting: *Deleted

## 2015-11-23 NOTE — Telephone Encounter (Signed)
Spoke with patient's mom because patient had not made it back home from being in the office. Let her know that she could call her insurance company and ask them what medications is on her formulary that they will cover and get this information to [place] and we can try one of them for her. Patient's mother states she will get the information to [place]. Ok with plan. Patient stated when she was at the office that I could call and speak to her mom or dad.

## 2015-11-23 NOTE — Telephone Encounter (Signed)
There is not a generic for Vesicare.  If she would check her insurance formulary, it would list medications her insurance will cover.  Then we can try one of those for her.

## 2015-11-23 NOTE — Telephone Encounter (Signed)
Patient came in to office this afternoon wanting to know if there was a generic for Vesicare. Patient states Larene Beach put her on this in February and Insurance denied the medication. I let patient know that I would let Larene Beach know and see if there could be an alternative to a med she can take. I gave patient 2 boxes of Vesicare 10mg  and she takes it every other day. Let patient know I would call her back when [facility] responds. Patient ok with plan.

## 2015-12-12 ENCOUNTER — Other Ambulatory Visit: Payer: Self-pay | Admitting: General Surgery

## 2015-12-12 DIAGNOSIS — Z794 Long term (current) use of insulin: Secondary | ICD-10-CM

## 2015-12-12 DIAGNOSIS — K439 Ventral hernia without obstruction or gangrene: Secondary | ICD-10-CM

## 2015-12-12 DIAGNOSIS — Z992 Dependence on renal dialysis: Secondary | ICD-10-CM

## 2015-12-12 DIAGNOSIS — I10 Essential (primary) hypertension: Secondary | ICD-10-CM

## 2015-12-12 DIAGNOSIS — N186 End stage renal disease: Secondary | ICD-10-CM

## 2015-12-12 DIAGNOSIS — E1122 Type 2 diabetes mellitus with diabetic chronic kidney disease: Secondary | ICD-10-CM

## 2015-12-14 ENCOUNTER — Ambulatory Visit
Admission: RE | Admit: 2015-12-14 | Discharge: 2015-12-14 | Disposition: A | Discharge status: Home / Self Care | Payer: Medicare Other | Source: Ambulatory Visit | Attending: General Surgery | Admitting: General Surgery

## 2015-12-14 DIAGNOSIS — K439 Ventral hernia without obstruction or gangrene: Secondary | ICD-10-CM

## 2015-12-14 DIAGNOSIS — Z794 Long term (current) use of insulin: Secondary | ICD-10-CM | POA: Insufficient documentation

## 2015-12-14 DIAGNOSIS — E1122 Type 2 diabetes mellitus with diabetic chronic kidney disease: Secondary | ICD-10-CM | POA: Insufficient documentation

## 2015-12-14 DIAGNOSIS — I120 Hypertensive chronic kidney disease with stage 5 chronic kidney disease or end stage renal disease: Secondary | ICD-10-CM | POA: Diagnosis not present

## 2015-12-14 DIAGNOSIS — Z992 Dependence on renal dialysis: Secondary | ICD-10-CM | POA: Insufficient documentation

## 2015-12-14 DIAGNOSIS — N186 End stage renal disease: Secondary | ICD-10-CM

## 2015-12-14 DIAGNOSIS — I10 Essential (primary) hypertension: Secondary | ICD-10-CM

## 2015-12-21 ENCOUNTER — Other Ambulatory Visit: Payer: Self-pay | Admitting: Urology

## 2015-12-21 ENCOUNTER — Telehealth: Payer: Self-pay | Admitting: Urology

## 2015-12-21 MED ORDER — DARIFENACIN HYDROBROMIDE ER 15 MG PO TB24: 15.0000 mg | ORAL_TABLET | Freq: Every day | ORAL | 4 refills | Status: DC

## 2015-12-21 NOTE — Telephone Encounter (Signed)
Patient dropped off a list of overactive bladder medications that were listed on her insurance formulary. Enablex 15 mg (darifenacin) was highlighted, so we will send this medication to her pharmacy.  I have sent a prescription into [facility] for the patient.  The other medications listed were Sanctura (tropsium) and Ditropan (oxybutynin).  I would ask that the patient RTC in 3 weeks after starting the new medication, so that we can check a PVR and make sure she is tolerating the medication.

## 2015-12-24 NOTE — Telephone Encounter (Signed)
Spoke with patient and let her know which medication was sent into her pharmacy for her for OAB. Patient states ok with plan and was transferred to [place] to schedule an appointment for three week follow up.

## 2016-01-14 ENCOUNTER — Ambulatory Visit: Payer: Medicare Other | Admitting: Urology

## 2016-01-28 ENCOUNTER — Telehealth: Payer: Self-pay | Admitting: Neurology

## 2016-01-28 NOTE — Telephone Encounter (Signed)
I spoke to pt's mother, per DPR, and advised her that the tegretol RX had 10 refills in 10/2015 and further refills should not be needed at this time. I asked pt's mother to call [facility] and verify that they have refills on file and ask them to call us if they do not. Pt's mother verbalized understanding.

## 2016-01-28 NOTE — Telephone Encounter (Signed)
Patient requesting refill of  carbamazepine (TEGRETOL-XR) 100 MG 12 hr tablet Pharmacy: [facility], [place]

## 2016-02-01 DIAGNOSIS — Z8719 Personal history of other diseases of the digestive system: Secondary | ICD-10-CM | POA: Insufficient documentation

## 2016-02-05 DIAGNOSIS — R160 Hepatomegaly, not elsewhere classified: Secondary | ICD-10-CM | POA: Insufficient documentation

## 2016-02-07 ENCOUNTER — Telehealth: Payer: Self-pay | Admitting: Obstetrics and Gynecology

## 2016-02-07 MED ORDER — FLUCONAZOLE 150 MG PO TABS: 150.0000 mg | ORAL_TABLET | Freq: Every day | ORAL | 0 refills | Status: DC

## 2016-02-07 NOTE — Telephone Encounter (Signed)
PT CALLED AND SHE STATED THAT SHE HAD HER UMBILICAL SURGERY LAST WEEK ON Friday, DR DE REFERRED HER TO HAVE THIS DONE AND SHE IS HAVING TO HAVE ANTIBIOTIC THRU IV FLUID, AND SHE HAS DEVELOPED A REALLY BAD YEAST INFECTION AND WANTED TO KNOW IF DR DE COULD SEND HER IN MEDICATION FOR IT. IF YOU HAVE TO CALL HER SHE WILL BE A DIALYSIS TODAY SO SHE IS ASKING THAT YOU SPEAK WITH HER MOTHER MARY LEIGH, HER MOTHER KNOW ALL ABOUT IT, SHE WILL BE A DIALYSIS AT 11 UNTIL 3:30 THIS AFTERNOON.

## 2016-02-07 NOTE — Telephone Encounter (Signed)
Pts MotherTheodis Sato) aware diflucan erx.

## 2016-02-12 ENCOUNTER — Telehealth: Payer: Self-pay | Admitting: Obstetrics and Gynecology

## 2016-02-12 MED ORDER — FLUCONAZOLE 150 MG PO TABS: 150.0000 mg | ORAL_TABLET | Freq: Every day | ORAL | 0 refills | Status: DC

## 2016-02-12 NOTE — Telephone Encounter (Signed)
Pt was given diflucan on 02/07/16 for y/i. She states she is better but not all sx are gone. She did start a new atb on 10/5. Advised I will send in 1 diflucan to be taken on Thursday after completion of ATB. Pt aware is no better after 2nd diflucan she will need to be seen.

## 2016-02-12 NOTE — Telephone Encounter (Signed)
YOU CAN TALK TO [place] (SHE'S GOING TO DIALAYSIS). DR DE GAVE HER DIFLUCAN, AND SHE IS A LIL BETTER, BUT SHE IS TAKING FLEX, AND SHE WANTS TO KNOW IF MAYBE THAT'S WHY SHE'S NOT GETTING BETTER FASTER.

## 2016-03-03 ENCOUNTER — Other Ambulatory Visit: Payer: Self-pay

## 2016-03-03 DIAGNOSIS — R404 Transient alteration of awareness: Secondary | ICD-10-CM

## 2016-03-03 DIAGNOSIS — R4789 Other speech disturbances: Secondary | ICD-10-CM

## 2016-03-03 DIAGNOSIS — R270 Ataxia, unspecified: Secondary | ICD-10-CM

## 2016-03-03 MED ORDER — LAMOTRIGINE 200 MG PO TABS: 300.0000 mg | ORAL_TABLET | Freq: Two times a day (BID) | ORAL | 3 refills | Status: DC

## 2016-03-07 ENCOUNTER — Emergency Department: Payer: Medicare Other

## 2016-03-07 ENCOUNTER — Emergency Department
Admission: EM | Admit: 2016-03-07 | Discharge: 2016-03-07 | Disposition: A | Discharge status: Home / Self Care | Payer: Medicare Other | Attending: Emergency Medicine | Admitting: Emergency Medicine

## 2016-03-07 ENCOUNTER — Encounter: Payer: Self-pay | Admitting: Emergency Medicine

## 2016-03-07 DIAGNOSIS — J45909 Unspecified asthma, uncomplicated: Secondary | ICD-10-CM | POA: Diagnosis not present

## 2016-03-07 DIAGNOSIS — Z7982 Long term (current) use of aspirin: Secondary | ICD-10-CM | POA: Diagnosis not present

## 2016-03-07 DIAGNOSIS — N186 End stage renal disease: Secondary | ICD-10-CM | POA: Insufficient documentation

## 2016-03-07 DIAGNOSIS — I120 Hypertensive chronic kidney disease with stage 5 chronic kidney disease or end stage renal disease: Secondary | ICD-10-CM | POA: Insufficient documentation

## 2016-03-07 DIAGNOSIS — J449 Chronic obstructive pulmonary disease, unspecified: Secondary | ICD-10-CM | POA: Insufficient documentation

## 2016-03-07 DIAGNOSIS — Z992 Dependence on renal dialysis: Secondary | ICD-10-CM | POA: Insufficient documentation

## 2016-03-07 DIAGNOSIS — Z794 Long term (current) use of insulin: Secondary | ICD-10-CM | POA: Insufficient documentation

## 2016-03-07 DIAGNOSIS — R0789 Other chest pain: Secondary | ICD-10-CM | POA: Insufficient documentation

## 2016-03-07 DIAGNOSIS — E1122 Type 2 diabetes mellitus with diabetic chronic kidney disease: Secondary | ICD-10-CM | POA: Diagnosis not present

## 2016-03-07 DIAGNOSIS — E039 Hypothyroidism, unspecified: Secondary | ICD-10-CM | POA: Insufficient documentation

## 2016-03-07 LAB — CBC
HCT: 22.6 % — ABNORMAL LOW (ref 35.0–47.0)
Hemoglobin: 7.9 g/dL — ABNORMAL LOW (ref 12.0–16.0)
MCH: 33.4 pg (ref 26.0–34.0)
MCHC: 34.7 g/dL (ref 32.0–36.0)
MCV: 96 fL (ref 80.0–100.0)
Platelets: 193 10*3/uL (ref 150–440)
RBC: 2.36 MIL/uL — ABNORMAL LOW (ref 3.80–5.20)
RDW: 17.4 % — ABNORMAL HIGH (ref 11.5–14.5)
WBC: 8.7 10*3/uL (ref 3.6–11.0)

## 2016-03-07 LAB — BASIC METABOLIC PANEL
Anion gap: 11 (ref 5–15)
BUN: 19 mg/dL (ref 6–20)
CO2: 34 mmol/L — ABNORMAL HIGH (ref 22–32)
Calcium: 8.7 mg/dL — ABNORMAL LOW (ref 8.9–10.3)
Chloride: 95 mmol/L — ABNORMAL LOW (ref 101–111)
Creatinine, Ser: 1.98 mg/dL — ABNORMAL HIGH (ref 0.44–1.00)
GFR calc Af Amer: 31 mL/min — ABNORMAL LOW (ref >60)
GFR calc non Af Amer: 27 mL/min — ABNORMAL LOW (ref >60)
Glucose, Bld: 99 mg/dL (ref 65–99)
Potassium: 2.9 mmol/L — ABNORMAL LOW (ref 3.5–5.1)
Sodium: 140 mmol/L (ref 135–145)

## 2016-03-07 LAB — TROPONIN I
Troponin I: <0.03 ng/mL (ref <0.03)
Troponin I: <0.03 ng/mL (ref <0.03)

## 2016-03-07 LAB — FIBRIN DERIVATIVES D-DIMER (ARMC ONLY): Fibrin derivatives D-dimer (ARMC): 1548 — ABNORMAL HIGH (ref 0–499)

## 2016-03-07 MED ORDER — POTASSIUM CHLORIDE CRYS ER 20 MEQ PO TBCR
20.0000 meq | EXTENDED_RELEASE_TABLET | Freq: Once | ORAL | Status: AC
  Administered 2016-03-07: 20 meq via ORAL
  Filled 2016-03-07: qty 1

## 2016-03-07 NOTE — ED Provider Notes (Signed)
-----------------------------------------   8:25 AM on 03/07/2016 -----------------------------------------  Patient signed out to me at 7 this morning. Has a cough and bronchitis on antibiotics, has very reproducible chest wall pain on the left upper chest. When I touch this area she states "ouch that's the pain right there" and pulls back. Pain does also seem to involve her pectoralis muscle towards where it inserts in the left arm. She moves her arm it hurts. There is no joint tenderness or erythema there is no shingles noted crepitus or flail chest. The patient feels that she hurt her chest coughing at this point. She has no evidence of lower extremity blood clot. She doesn't a positive d-dimer. This was ordered by the prior physician. Was a thought that if her ultrasounds were negative her chest x-ray was negative, she would not require CT scan given his low pretest probability. I did discuss with the patient the findings including the d-dimer, and the fact that we cannot definitively rule out pulmonary embolism without a contrasted CT. Patient would prefer not to have this. She does understand the risks of not having it. She understands a blood clot can be deadly disease and she understands I cannot diagnose in or rule it out without a CT scan. Patient is concerned about the contrast as she still makes urine and she understands that there is some degree of nephrotoxicity to the contrast which is certainly true. She is already on dialysis however I have assured her that they can dialyze this however, at this point she would prefer not to have a CT scan. This does not strike me as unreasonable, the d-dimer is a very nonspecific finding that does not definitively dictated a blood clot is present, she has no lower extremity blood clot, she is very reproducible chest wall pain in the context of cough. Nonetheless, it has been offered to her and she is at this time declining it. Her potassium was somewhat low here  but it is been repleted by the prior physician. She is scheduled for dialysis tomorrow. Aside from pain when she changes position or touches her arm the wrong way, she does not have any discomfort at this time. She's had 2 negative troponins. He did not think this likely represents ACS. Patient states she was actually coughing when this all started. Her chest x-ray is reassuring, there is very mild fluid noted which would make a VQ scan less sensitive. Patient EKG does not show any acute ischemia. At this time, as patient declines CT scan I think she is safe for discharge with outpatient follow-up and return precautions given and understood. Vital signs are reassuring. In reading the prior physician's note, he states "have a strong clinical suspicion for pulmonary embolism" but what he conveyed to me and what he expressly stated was he "does NOT have a strong suspicion for pulmonary embolism.". This is a mistranslation of oral speech by the computerized voice recognition software.   Schuyler Amor, MD 03/07/16 [phone]

## 2016-03-07 NOTE — ED Triage Notes (Signed)
Pt in with co chest pain since 0200 states had same symptoms last week and dx with bronchitis. Pt currently still taking antibiotics, also taking prednisone.

## 2016-03-07 NOTE — Discharge Instructions (Signed)
We discussed having a ct scan and at this time, you would prefer not to have one. This is not unreasonable, but as we discussed it does limit what we can rule out in terms of what is causing your pain.  If you have increased pain, shortness of breath, change in the character of your pain, you change you mind about a ct, or you have any other new or worrisome symptoms, please return to the emergency department. Follow up closely with your doctor.

## 2016-03-07 NOTE — ED Provider Notes (Signed)
Time Seen: Approximately 0410  I have reviewed the triage notes  Chief Complaint: Chest Pain   History of Present Illness: Nina Johnston is a 55 y.o. female *who has a history of multiple medical problems. Patient states she started developing some chest discomfort this morning at 2 AM. She had some similar symptoms last week and saw her pulmonologist. Patient's been on prednisone. The patient has a history of renal failure and is currently on the transplant list at [facility]. She normally receives her dialysis on Tuesday, Thursday, and Saturdays. She states she had a full non-complicated dialysis treatment yesterday. The chest discomfort seems to be up the left upper chest wall region toward her left shoulder and states the pain will go down her left arm. Denies any shortness of breath at present though states that she was breathing very fast when she woke up this morning. She has a known history of anemia. She states she had some abdominal hernia repair that was performed laparoscopically last month at [facility].   Past Medical History:  Diagnosis Date  . Acute bacterial sinusitis 04/19/2014  . Anxiety and depression   . Asthma   . 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 Benign essential HTN 12/26/2014  . Bipolar disorder ([place])   . Bronchitis   . Carotid artery narrowing 12/22/2014  . CKD (chronic kidney disease)   . Concussion 01-14-14   Fall   . COPD (chronic obstructive pulmonary disease) ([place])   . Diabetes mellitus without complication ([place])   . Diplopia   . Essential (primary) hypertension 02/12/2015  . Family history of thyroid problem   . GERD (gastroesophageal reflux disease)   . Heart disease    enlarged because of COPD  . Heart murmur   . HLD (hyperlipidemia)   . Hyperkalemia   . Hypertension   . Hyperthyroidism   . Morbid obesity ([place])   . Multiple thyroid nodules   . OAB (overactive bladder)   .  Pernicious anemia   . Post-concussion syndrome 03/08/2014  . Renal disease    w/ GFR 29-may be due to diabetes  . Seizures ([place])   . Sleep apnea   . 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 Transient alteration of awareness 11/10/2012  . Urinary incontinence with continuous leakage 03/08/2014    Patient Active Problem List   Diagnosis Date Noted  . End-stage renal disease on hemodialysis ([place]) 08/24/2015  . OAB (overactive bladder) 06/07/2015  . Incontinence 06/07/2015  . Airway hyperreactivity 06/06/2015  . Chronic kidney disease, stage V ([place]) 06/06/2015  . Binocular vision disorder with diplopia 06/06/2015  . Type 2 diabetes mellitus ([place]) 06/01/2015  . Severe diabetic hypoglycemia ([place]) 04/16/2015  . Convulsions ([place]) 02/12/2015  . Hypoglycemic reaction 02/12/2015  . Chronic kidney disease (CKD), stage IV (severe) ([place]) 02/12/2015  . Other symptoms and signs concerning food and fluid intake 02/12/2015  . Absence of menstruation 02/12/2015  . Absolute anemia 02/12/2015  . Post menopausal syndrome 02/12/2015  . Calculus of kidney 02/12/2015  . Chronic obstructive pulmonary disease ([place]) 02/12/2015  . Bone/cartilage disorder 02/12/2015  . Urinary system disease 02/12/2015  . Encounter for gynecological examination without abnormal finding 02/12/2015  . 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 Essential (primary) hypertension 02/12/2015  . Gout 02/12/2015  . Hernia, internal 02/12/2015  . Hypoglycemia 02/12/2015  . Adaptive colitis 02/12/2015  . Arthritis, degenerative 02/12/2015  . Postprocedural  state 02/12/2015  . Dupuytren's contracture of foot 02/12/2015  . Cutaneous eruption 02/12/2015  . 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 Absence of bladder continence 02/12/2015  . Cervical prolapse 02/12/2015  . Seizure ([place]) 02/12/2015  . Benign essential HTN 12/26/2014  . Carotid artery narrowing 12/22/2014  . Chest pain 12/22/2014  .  Combined fat and carbohydrate induced hyperlipemia 12/22/2014  . Acute bacterial sinusitis 04/19/2014  . Anemia in chronic illness 03/16/2014  . Urinary incontinence with continuous leakage 03/08/2014  . Post-concussion syndrome 03/08/2014  . Total urinary incontinence 03/08/2014  . 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 Apnea, sleep 01/17/2014  . Patient awaiting renal transplant 06/03/2013  . Bipolar affective disorder ([place]) 12/16/2012  . Acid reflux 12/16/2012  . Adiposity 12/16/2012  . Obstructive apnea 12/16/2012  . Addison anemia 12/16/2012  . Seizure disorder ([place]) 12/16/2012  . Ataxia 11/10/2012  . 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 Transient alteration of awareness 11/10/2012  . Severe obesity (BMI >= 40) ([place]) 11/10/2012  . Morbid obesity ([place]) 11/10/2012  . Speech disorder 11/10/2012  . Encounter for general adult medical examination without abnormal findings 09/08/2012  . Multinodular goiter 01/29/2012  . Cardiac murmur 10/29/2011  . High potassium 10/28/2011  . Adult hypothyroidism 03/12/2011    Past Surgical History:  Procedure Laterality Date  . AVF    . CHOLECYSTECTOMY    . ENDOMETRIAL BIOPSY     ablation, uterine  . PERIPHERAL VASCULAR CATHETERIZATION N/A 08/23/2015   Procedure: Dialysis/Perma Catheter Insertion;  Surgeon: Algernon Huxley, MD;  Location: [facility];  Service: Cardiovascular;  Laterality: N/A;  . PERIPHERAL VASCULAR CATHETERIZATION N/A 11/09/2015   Procedure: Dialysis/Perma Catheter Removal;  Surgeon: Katha Cabal, MD;  Location: [facility];  Service: Cardiovascular;  Laterality: N/A;  . PORT A CATH REVISION      Past Surgical History:  Procedure Laterality Date  . AVF    . CHOLECYSTECTOMY    . ENDOMETRIAL BIOPSY     ablation, uterine  . PERIPHERAL VASCULAR CATHETERIZATION N/A 08/23/2015   Procedure: Dialysis/Perma Catheter Insertion;  Surgeon: Algernon Huxley, MD;  Location: [facility];  Service:  Cardiovascular;  Laterality: N/A;  . PERIPHERAL VASCULAR CATHETERIZATION N/A 11/09/2015   Procedure: Dialysis/Perma Catheter Removal;  Surgeon: Katha Cabal, MD;  Location: [facility];  Service: Cardiovascular;  Laterality: N/A;  . PORT A CATH REVISION      Current Outpatient Rx  . Order #: [id number] Class: Historical Med  . Order #: [id number] Class: Historical Med  . Order #: [id number] Class: Historical Med  . Order #: [id number] Class: Normal  . Order #: [id number] Class: Historical Med  . Order #: [id number] Class: Print  . Order #: [id number] Class: Historical Med  . Order #: [id number] Class: Normal  . Order #: [id number] Class: Historical Med  . Order #: [id number] Class: Print  . Order #: [id number] Class: Historical Med  . Order #: [id number] Class: Historical Med  . Order #: [id number] Class: Historical Med  . Order #: [id number] Class: Normal  . Order #: [id number] Class: Historical Med  . Order #: [id number] Class: Historical Med  . Order #: [id number] Class: Historical Med  . Order #: [id number] Class: Historical Med  . Order #: [id number] Class: Historical Med  . Order #: [id number] Class: Historical Med  . Order #: [id number] Class: Historical Med  . Order #: [id number] Class: Historical Med  . Order #: [id number] Class: Normal  . Order #: [id number] Class: Historical Med  . Order #: [id number] Class: Historical Med  . Order #:  [id number] Class: Historical Med  . Order #: [id number] Class: Historical Med  . Order #: [id number] Class: Historical Med  . Order #: [id number] Class: Historical Med  . Order #: [id number] Class: Historical Med  . Order #: [id number] Class: Historical Med    Allergies:  Cinnamon; Procrit [epoetin alfa]; Ace inhibitors; Azithromycin; Neomycin-bacitracin zn-polymyx; and Sulfa antibiotics  Family History: Family History  Problem Relation Age of Onset  . Diabetes Mother   . Lumbar disc disease Mother   . Migraines Mother   . Hypertension Mother   . Cancer - Prostate Father     mild  .  Diabetes Father   . Hypertension Father   . Breast cancer Neg Hx   . Ovarian cancer Neg Hx   . Colon cancer Neg Hx   . Heart disease Neg Hx     Social History: Social History  Substance Use Topics  . Smoking status: Never Smoker  . Smokeless tobacco: Never Used  . Alcohol use No     Review of Systems:   10 point review of systems was performed and was otherwise negative:  Constitutional: No fever Eyes: No visual disturbances ENT: No sore throat, ear pain Cardiac: Sharp left upper chest discomfort toward the left shoulder Respiratory: No persistent shortness of breath, wheezing, or stridor Abdomen: No abdominal pain, no vomiting, No diarrhea Endocrine: No weight loss, No night sweats Extremities: No peripheral edema, cyanosis Skin: No rashes, easy bruising Neurologic: No focal weakness, trouble with speech or swollowing Urologic: No dysuria, Hematuria, or urinary frequency   Physical Exam:  ED Triage Vitals  Enc Vitals Group     BP 03/07/16 0326 (!) 167/76     Pulse Rate 03/07/16 0326 84     Resp 03/07/16 0326 18     Temp 03/07/16 0326 98.7 F (37.1 C)     Temp Source 03/07/16 0326 Oral     SpO2 03/07/16 0326 99 %     Weight 03/07/16 0323 194 lb (88 kg)     Height 03/07/16 0323 5\' 2"  (1.575 m)     Head Circumference --      Peak Flow --      Pain Score 03/07/16 0323 7     Pain Loc --      Pain Edu? --      Excl. in [place]? --     General: Awake , Alert , and Oriented times 3; GCS 15 Head: Normal cephalic , atraumatic Eyes: Pupils equal , round, reactive to light Nose/Throat: No nasal drainage, patent upper airway without erythema or exudate.  Neck: Supple, Full range of motion, No anterior adenopathy or palpable thyroid masses Lungs: Clear to ascultation without wheezes , rhonchi, or rales Heart: Regular rate, regular rhythm without murmurs , gallops , or rubs Abdomen: Soft, non tender without rebound, guarding , or rigidity; bowel sounds positive and  symmetric in all 4 quadrants. No organomegaly .        Extremities: 2 plus symmetric pulses. No edema, clubbing or cyanosis Neurologic: normal ambulation, Motor symmetric without deficits, sensory intact Skin: warm, dry, no rashesChest wall shows some reproducible pain left upper chest wall and also into the left clavicular region pain with palpation and also movement of her left arm Labs:   All laboratory work was reviewed including any pertinent negatives or positives listed below:  Labs Reviewed  CBC - Abnormal; Notable for the following:       Result Value   RBC 2.36 (*)    Hemoglobin 7.9 (*)  HCT 22.6 (*)    RDW 17.4 (*)    All other components within normal limits  BASIC METABOLIC PANEL - Abnormal; Notable for the following:    Potassium 2.9 (*)    Chloride 95 (*)    CO2 34 (*)    Creatinine, Ser 1.98 (*)    Calcium 8.7 (*)    GFR calc non Af Amer 27 (*)    GFR calc Af Amer 31 (*)    All other components within normal limits  TROPONIN I  FIBRIN DERIVATIVES D-DIMER ([facility] ONLY)    EKG:  ED ECG REPORT I, Daymon Larsen, the attending physician, personally viewed and interpreted this ECG.  Date: 03/07/2016 EKG Time: *0433 Rate: *74 Rhythm: normal sinus rhythm QRS Axis: normal Intervals: Right anterior fascicular block ST/T Wave abnormalities: normal Conduction Disturbances: none Narrative Interpretation: unremarkable No acute ischemic changes   Radiology:  "Dg Chest 2 View  Result Date: 03/07/2016 CLINICAL DATA:  Chest pain since 02:00. Currently taking antibiotics for bronchitis. EXAM: CHEST  2 VIEW COMPARISON:  12/20/2014 FINDINGS: Unchanged cardiomegaly. No airspace consolidation. No effusions. Mild chronic appearing vascular congestion. No alveolar edema. IMPRESSION: Unchanged cardiomegaly and chronic appearing vascular fullness. No airspace consolidation. No effusion Electronically Signed   By: Andreas Newport M.D.   On: 03/07/2016 03:48  "  Doppler  studies pending of both lower extremities I personally reviewed the radiologic studies     ED Course:  Differential includes all life-threatening causes for chest pain. This includes but is not exclusive to acute coronary syndrome, aortic dissection, pulmonary embolism, cardiac tamponade, community-acquired pneumonia, pericarditis, musculoskeletal chest wall pain, etc. I felt given the patient's clinical presentation and objective findings this was unlikely to be a life-threatening cause for her chest pain. We do have pending studies at this time including a repeat troponin and also bilateral Doppler studies of her lower extremities. The patient's D-dimer test is elevated neck and I perform a chest CT due to her poor renal function. Have a strong clinical suspicion for pulmonary embolism and I felt we could assess this by investigating for deep venous thrombosis. She has numerous medical reasons to have an elevated D-dimer test. Clinical Course     Assessment:  Acute unspecified chest pain    Plan: * Patient's care will be handed off to my colleague this morning as some of her results are still pending.Daymon Larsen, MD 03/07/16 [phone]

## 2016-03-07 NOTE — ED Notes (Signed)
Pt. States Saturday the chest pain and "respirations of 30" "I didn't know I should've woken mama and daddy up to bring me in for my fast respirations" "Dx with bronchitis 2 days ago".   Pt. States came in tonight due to fast respirations persisting. "my respirations are in the 40s"

## 2016-03-21 ENCOUNTER — Other Ambulatory Visit: Payer: Self-pay | Admitting: Internal Medicine

## 2016-03-21 ENCOUNTER — Ambulatory Visit
Admission: RE | Admit: 2016-03-21 | Discharge: 2016-03-21 | Disposition: A | Discharge status: Home / Self Care | Payer: Medicare Other | Source: Ambulatory Visit | Attending: Internal Medicine | Admitting: Internal Medicine

## 2016-03-21 DIAGNOSIS — Z1231 Encounter for screening mammogram for malignant neoplasm of breast: Secondary | ICD-10-CM | POA: Diagnosis present

## 2016-04-01 ENCOUNTER — Other Ambulatory Visit (INDEPENDENT_AMBULATORY_CARE_PROVIDER_SITE_OTHER): Payer: Self-pay | Admitting: Vascular Surgery

## 2016-04-02 ENCOUNTER — Encounter: Admission: RE | Payer: Self-pay | Source: Ambulatory Visit

## 2016-04-02 ENCOUNTER — Ambulatory Visit: Admission: RE | Admit: 2016-04-02 | Payer: Medicare Other | Source: Ambulatory Visit | Admitting: Vascular Surgery

## 2016-04-02 SURGERY — A/V FISTULAGRAM
Anesthesia: Moderate Sedation | Laterality: Left

## 2016-04-10 ENCOUNTER — Encounter: Payer: Self-pay | Admitting: Emergency Medicine

## 2016-04-10 ENCOUNTER — Emergency Department: Payer: Medicare Other

## 2016-04-10 ENCOUNTER — Emergency Department
Admission: EM | Admit: 2016-04-10 | Discharge: 2016-04-10 | Disposition: A | Discharge status: Home / Self Care | Payer: Medicare Other | Attending: Emergency Medicine | Admitting: Emergency Medicine

## 2016-04-10 DIAGNOSIS — R000 Tachycardia, unspecified: Secondary | ICD-10-CM

## 2016-04-10 DIAGNOSIS — E039 Hypothyroidism, unspecified: Secondary | ICD-10-CM | POA: Diagnosis not present

## 2016-04-10 DIAGNOSIS — Z794 Long term (current) use of insulin: Secondary | ICD-10-CM | POA: Insufficient documentation

## 2016-04-10 DIAGNOSIS — Z7982 Long term (current) use of aspirin: Secondary | ICD-10-CM | POA: Insufficient documentation

## 2016-04-10 DIAGNOSIS — E1122 Type 2 diabetes mellitus with diabetic chronic kidney disease: Secondary | ICD-10-CM | POA: Diagnosis not present

## 2016-04-10 DIAGNOSIS — N185 Chronic kidney disease, stage 5: Secondary | ICD-10-CM | POA: Insufficient documentation

## 2016-04-10 DIAGNOSIS — R0602 Shortness of breath: Secondary | ICD-10-CM | POA: Diagnosis present

## 2016-04-10 DIAGNOSIS — I4892 Unspecified atrial flutter: Secondary | ICD-10-CM

## 2016-04-10 DIAGNOSIS — I1311 Hypertensive heart and chronic kidney disease without heart failure, with stage 5 chronic kidney disease, or end stage renal disease: Secondary | ICD-10-CM | POA: Diagnosis not present

## 2016-04-10 DIAGNOSIS — Z79899 Other long term (current) drug therapy: Secondary | ICD-10-CM | POA: Insufficient documentation

## 2016-04-10 DIAGNOSIS — J449 Chronic obstructive pulmonary disease, unspecified: Secondary | ICD-10-CM | POA: Diagnosis not present

## 2016-04-10 DIAGNOSIS — J45909 Unspecified asthma, uncomplicated: Secondary | ICD-10-CM | POA: Insufficient documentation

## 2016-04-10 DIAGNOSIS — R9431 Abnormal electrocardiogram [ECG] [EKG]: Secondary | ICD-10-CM

## 2016-04-10 LAB — CBC WITH DIFFERENTIAL/PLATELET
Basophils Absolute: 0.1 10*3/uL (ref 0–0.1)
Basophils Relative: 1 %
Eosinophils Absolute: 0.1 10*3/uL (ref 0–0.7)
Eosinophils Relative: 1 %
HCT: 23.1 % — ABNORMAL LOW (ref 35.0–47.0)
Hemoglobin: 8.1 g/dL — ABNORMAL LOW (ref 12.0–16.0)
Lymphocytes Relative: 18 %
Lymphs Abs: 1.5 10*3/uL (ref 1.0–3.6)
MCH: 35.4 pg — ABNORMAL HIGH (ref 26.0–34.0)
MCHC: 34.8 g/dL (ref 32.0–36.0)
MCV: 101.6 fL — ABNORMAL HIGH (ref 80.0–100.0)
Monocytes Absolute: 0.5 10*3/uL (ref 0.2–0.9)
Monocytes Relative: 5 %
Neutro Abs: 6.6 10*3/uL — ABNORMAL HIGH (ref 1.4–6.5)
Neutrophils Relative %: 75 %
Platelets: 211 10*3/uL (ref 150–440)
RBC: 2.28 MIL/uL — ABNORMAL LOW (ref 3.80–5.20)
RDW: 15.9 % — ABNORMAL HIGH (ref 11.5–14.5)
WBC: 8.7 10*3/uL (ref 3.6–11.0)

## 2016-04-10 LAB — TROPONIN I: Troponin I: <0.03 ng/mL (ref <0.03)

## 2016-04-10 LAB — BASIC METABOLIC PANEL
Anion gap: 15 (ref 5–15)
BUN: 16 mg/dL (ref 6–20)
CO2: 28 mmol/L (ref 22–32)
Calcium: 9.1 mg/dL (ref 8.9–10.3)
Chloride: 96 mmol/L — ABNORMAL LOW (ref 101–111)
Creatinine, Ser: 1.98 mg/dL — ABNORMAL HIGH (ref 0.44–1.00)
GFR calc Af Amer: 31 mL/min — ABNORMAL LOW (ref >60)
GFR calc non Af Amer: 27 mL/min — ABNORMAL LOW (ref >60)
Glucose, Bld: 176 mg/dL — ABNORMAL HIGH (ref 65–99)
Potassium: 3 mmol/L — ABNORMAL LOW (ref 3.5–5.1)
Sodium: 139 mmol/L (ref 135–145)

## 2016-04-10 LAB — PROTIME-INR
INR: 0.96
Prothrombin Time: 12.8 seconds (ref 11.4–15.2)

## 2016-04-10 LAB — BRAIN NATRIURETIC PEPTIDE: B Natriuretic Peptide: 389 pg/mL — ABNORMAL HIGH (ref 0.0–100.0)

## 2016-04-10 LAB — MAGNESIUM: Magnesium: 2 mg/dL (ref 1.7–2.4)

## 2016-04-10 LAB — APTT: aPTT: 37 seconds — ABNORMAL HIGH (ref 24–36)

## 2016-04-10 LAB — TSH: TSH: 2.373 u[IU]/mL (ref 0.350–4.500)

## 2016-04-10 MED ORDER — DEXTROSE 5 % IV SOLN
5.0000 mg/h | Freq: Once | INTRAVENOUS | Status: DC
  Filled 2016-04-10: qty 100

## 2016-04-10 MED ORDER — DILTIAZEM HCL 25 MG/5ML IV SOLN
INTRAVENOUS | Status: AC
  Administered 2016-04-10: 20 mg via INTRAVENOUS
  Filled 2016-04-10: qty 5

## 2016-04-10 MED ORDER — DILTIAZEM HCL 25 MG/5ML IV SOLN
20.0000 mg | Freq: Once | INTRAVENOUS | Status: AC
  Administered 2016-04-10: 20 mg via INTRAVENOUS

## 2016-04-10 MED ORDER — METOPROLOL TARTRATE 25 MG PO TABS: 25.0000 mg | ORAL_TABLET | Freq: Two times a day (BID) | ORAL | 0 refills | Status: DC

## 2016-04-10 NOTE — ED Notes (Signed)
IV removed from right wrist by this RN.

## 2016-04-10 NOTE — ED Triage Notes (Signed)
Pt to ED via EMS from dialysis c/o SOB, chest pain, and a.fib.  Patient finished 3 of the 4 hours of dialysis when started having symptoms.  Patient without hx of a.fib.  EMS vitals 150/84, 150 HR, 96%.

## 2016-04-10 NOTE — ED Provider Notes (Addendum)
[facility] Emergency Department Provider Note  ____________________________________________   I have reviewed the triage vital signs and the nursing notes.   HISTORY  Chief Complaint Chest Pain; Shortness of Breath; and Atrial Fibrillation    HPI Nina Johnston is a 55 y.o. female who isnot known to have any history of cardiac disease. Patient does have a history of thyroid, end-stage renal and multiple other medical problems. She was at dialysis this morning, and her normal state of health when she began to have chest tightness which is no longer present. She also had some mild shortness of breath. Was noted that her heart rate was very elevated. EMS found her to be at a rate of 150 and brought her in. Blood pressure is been fine. She has no chest pain or shortness of this at this time. She received a partially two thirds of her dialysis.      Past Medical History:  Diagnosis Date  . Acute bacterial sinusitis 04/19/2014  . Anxiety and depression   . Asthma   . 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 Benign essential HTN 12/26/2014  . Bipolar disorder ([place])   . Bronchitis   . Carotid artery narrowing 12/22/2014  . CKD (chronic kidney disease)   . Concussion 01-14-14   Fall   . COPD (chronic obstructive pulmonary disease) ([place])   . Diabetes mellitus without complication ([place])   . Diplopia   . Essential (primary) hypertension 02/12/2015  . Family history of thyroid problem   . GERD (gastroesophageal reflux disease)   . Heart disease    enlarged because of COPD  . Heart murmur   . HLD (hyperlipidemia)   . Hyperkalemia   . Hypertension   . Hyperthyroidism   . Morbid obesity ([place])   . Multiple thyroid nodules   . OAB (overactive bladder)   . Pernicious anemia   . Post-concussion syndrome 03/08/2014  . Renal disease    w/ GFR 29-may be due to diabetes  . Seizures ([place])   .  Sleep apnea   . 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 Transient alteration of awareness 11/10/2012  . Urinary incontinence with continuous leakage 03/08/2014    Patient Active Problem List   Diagnosis Date Noted  . End-stage renal disease on hemodialysis ([place]) 08/24/2015  . OAB (overactive bladder) 06/07/2015  . Incontinence 06/07/2015  . Airway hyperreactivity 06/06/2015  . Chronic kidney disease, stage V ([place]) 06/06/2015  . Binocular vision disorder with diplopia 06/06/2015  . Type 2 diabetes mellitus ([place]) 06/01/2015  . Severe diabetic hypoglycemia ([place]) 04/16/2015  . Convulsions ([place]) 02/12/2015  . Hypoglycemic reaction 02/12/2015  . Chronic kidney disease (CKD), stage IV (severe) ([place]) 02/12/2015  . Other symptoms and signs concerning food and fluid intake 02/12/2015  . Absence of menstruation 02/12/2015  . Absolute anemia 02/12/2015  . Post menopausal syndrome 02/12/2015  . Calculus of kidney 02/12/2015  . Chronic obstructive pulmonary disease ([place]) 02/12/2015  . Bone/cartilage disorder 02/12/2015  . Urinary system disease 02/12/2015  . Encounter for gynecological examination without abnormal finding 02/12/2015  . 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 Essential (primary) hypertension 02/12/2015  . Gout 02/12/2015  . Hernia, internal 02/12/2015  . Hypoglycemia 02/12/2015  . Adaptive colitis 02/12/2015  . Arthritis, degenerative 02/12/2015  . Postprocedural state 02/12/2015  . Dupuytren's contracture of foot 02/12/2015  . Cutaneous eruption 02/12/2015  . 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 Absence of bladder continence 02/12/2015  . Cervical prolapse 02/12/2015  . Seizure ([place]) 02/12/2015  . Benign essential HTN 12/26/2014  . Carotid artery narrowing 12/22/2014  . Chest pain 12/22/2014  . Combined fat and carbohydrate induced hyperlipemia 12/22/2014  . Acute bacterial sinusitis 04/19/2014  . Anemia in chronic illness  03/16/2014  . Urinary incontinence with continuous leakage 03/08/2014  . Post-concussion syndrome 03/08/2014  . Total urinary incontinence 03/08/2014  . 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 Apnea, sleep 01/17/2014  . Patient awaiting renal transplant 06/03/2013  . Bipolar affective disorder ([place]) 12/16/2012  . Acid reflux 12/16/2012  . Adiposity 12/16/2012  . Obstructive apnea 12/16/2012  . Addison anemia 12/16/2012  . Seizure disorder ([place]) 12/16/2012  . Ataxia 11/10/2012  . 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 Transient alteration of awareness 11/10/2012  . Severe obesity (BMI >= 40) ([place]) 11/10/2012  . Morbid obesity ([place]) 11/10/2012  . Speech disorder 11/10/2012  . Encounter for general adult medical examination without abnormal findings 09/08/2012  . Multinodular goiter 01/29/2012  . Cardiac murmur 10/29/2011  . High potassium 10/28/2011  . Adult hypothyroidism 03/12/2011    Past Surgical History:  Procedure Laterality Date  . AVF    . CHOLECYSTECTOMY    . ENDOMETRIAL BIOPSY     ablation, uterine  . HERNIA REPAIR    . PERIPHERAL VASCULAR CATHETERIZATION N/A 08/23/2015   Procedure: Dialysis/Perma Catheter Insertion;  Surgeon: Algernon Huxley, MD;  Location: [facility];  Service: Cardiovascular;  Laterality: N/A;  . PERIPHERAL VASCULAR CATHETERIZATION N/A 11/09/2015   Procedure: Dialysis/Perma Catheter Removal;  Surgeon: Katha Cabal, MD;  Location: [facility];  Service: Cardiovascular;  Laterality: N/A;  . PORT A CATH REVISION      Prior to Admission medications   Medication Sig Start Date End Date Taking? Authorizing Provider  amLODipine (NORVASC) 10 MG tablet Take 10 mg by mouth daily. 12/31/15   Historical Provider, MD  aspirin 81 MG tablet Take 81 mg by mouth daily.     Historical Provider, MD  b complex vitamins tablet Take 1 tablet by mouth daily.    Historical Provider, MD  carbamazepine (TEGRETOL-XR) 100 MG 12 hr tablet Two tablets in  the morning and one tablet in the evening. 10/22/15   Asencion Partridge Dohmeier, MD  cefdinir (OMNICEF) 300 MG capsule Take 300 mg by mouth daily as needed. Reported on 06/06/2015    Historical Provider, MD  chlorpheniramine-HYDROcodone (TUSSIONEX PENNKINETIC ER) 10-8 MG/5ML SUER Take 5 mLs by mouth every 12 (twelve) hours as needed for cough.    Historical Provider, MD  clonazePAM (KLONOPIN) 1 MG tablet Take 1 tablet (1 mg total) by mouth at bedtime. 10/22/15   Asencion Partridge Dohmeier, MD  cyanocobalamin (,VITAMIN B-12,) 1000 MCG/ML injection Inject 1,000 mcg into the muscle every 14 (fourteen) days.     Historical Provider, MD  darifenacin (ENABLEX) 15 MG 24 hr tablet Take 1 tablet (15 mg total) by mouth daily. 12/21/15   Nori Riis, PA-C  dextromethorphan (DELSYM) 30 MG/5ML liquid Take 60 mg by mouth as needed for cough.    Historical Provider, MD  Docusate Calcium (STOOL SOFTENER PO) Take 1 capsule by mouth every morning.     Historical Provider, MD  ethosuximide (ZARONTIN) 250 MG/5ML solution TAKE 5ML BY MOUTH 2 TIMES DAILY 10/22/15   Larey Seat, MD  Febuxostat (ULORIC) 80 MG TABS Take 1 tablet by mouth daily. Morning dosage  Historical Provider, MD  fexofenadine (ALLEGRA) 180 MG tablet Take 180 mg by mouth daily. am    Historical Provider, MD  fluconazole (DIFLUCAN) 150 MG tablet Take 1 tablet (150 mg total) by mouth daily. Patient taking differently: Take 150 mg by mouth daily as needed.  02/12/16   Alanda Slim Defrancesco, MD  Fluticasone-Salmeterol (ADVAIR DISKUS) 250-50 MCG/DOSE AEPB Inhale 1 puff into the lungs 2 (two) times daily.    Historical Provider, MD  folic acid-vitamin b complex-vitamin c-selenium-zinc (DIALYVITE) 3 MG TABS tablet Take 1 tablet by mouth daily.    Historical Provider, MD  furosemide (LASIX) 20 MG tablet Take 30 mg by mouth 2 (two) times daily.    Historical Provider, MD  gemfibrozil (LOPID) 600 MG tablet Take 600 mg by mouth 2 (two) times daily before a meal.    Historical  Provider, MD  glipiZIDE (GLUCOTROL) 5 MG tablet Take 5-10 mg by mouth See admin instructions. 10 mg in the morning, and 5 mg at dinner 10/03/15   Historical Provider, MD  GuaiFENesin (MUCINEX PO) Take 1 tablet by mouth 2 (two) times daily as needed. Expectorant, as needed     Historical Provider, MD  hydrALAZINE (APRESOLINE) 50 MG tablet Take 50 mg by mouth 2 (two) times daily.     Historical Provider, MD  insulin glargine (LANTUS) 100 UNIT/ML injection Inject 10 Units into the skin at bedtime.     Historical Provider, MD  Insulin Pen Needle (BD PEN NEEDLE NANO U/F) 32G X 4 MM MISC  10/22/15   Historical Provider, MD  lamoTRIgine (LAMICTAL) 200 MG tablet Take 1.5 tablets (300 mg total) by mouth 2 (two) times daily. Brand Medically Necessary 03/03/16   Larey Seat, MD  levothyroxine (SYNTHROID) 150 MCG tablet Take 150 mcg by mouth daily. Am dosage    Historical Provider, MD  metoprolol succinate (TOPROL XL) 50 MG 24 hr tablet Take 50 mg by mouth 2 (two) times daily. Take with or immediately following a meal.    Historical Provider, MD  montelukast (SINGULAIR) 10 MG tablet Take 10 mg by mouth at bedtime.    Historical Provider, MD  pantoprazole (PROTONIX) 40 MG tablet Take 40 mg by mouth daily.    Historical Provider, MD  predniSONE (DELTASONE) 10 MG tablet Take 10 mg by mouth daily as needed. Reported on 06/06/2015    Historical Provider, MD  Probiotic Product (ALIGN) 4 MG CAPS Take 1 capsule by mouth daily.    Historical Provider, MD  sevelamer carbonate (RENVELA) 800 MG tablet Take 800-3,200 mg by mouth See admin instructions. 4  Tablets 3 times daily with meals, and 1 tablet with snacks    Historical Provider, MD  triamcinolone (NASACORT ALLERGY 24HR) 55 MCG/ACT AERO nasal inhaler Place 2 sprays into the nose at bedtime.    Historical Provider, MD    Allergies Cinnamon; Procrit [epoetin alfa]; Ace inhibitors; Azithromycin; Iron; Neomycin-bacitracin zn-polymyx; and Sulfa antibiotics  Family  History  Problem Relation Age of Onset  . Diabetes Mother   . Lumbar disc disease Mother   . Migraines Mother   . Hypertension Mother   . Cancer - Prostate Father     mild  . Diabetes Father   . Hypertension Father   . Breast cancer Neg Hx   . Ovarian cancer Neg Hx   . Colon cancer Neg Hx   . Heart disease Neg Hx     Social History Social History  Substance Use Topics  . Smoking status: Never Smoker  .  Smokeless tobacco: Never Used  . Alcohol use No    Review of Systems Constitutional: No fever/chills Eyes: No visual changes. ENT: No sore throat. No stiff neck no neck pain Cardiovascular: Did have some chest tightness initially now does not. Respiratory: Dorsal slight shortness of breath Gastrointestinal:   no vomiting.  No diarrhea.  No constipation. Genitourinary: Negative for dysuria. Musculoskeletal: Negative lower extremity swelling Skin: Negative for rash. Neurological: Negative for severe headaches, focal weakness or numbness. 10-point ROS otherwise negative.  ____________________________________________   PHYSICAL EXAM:  VITAL SIGNS: ED Triage Vitals  Enc Vitals Group     BP 04/10/16 1525 (!) 147/86     Pulse Rate 04/10/16 1525 (!) 153     Resp 04/10/16 1525 (!) 24     Temp 04/10/16 1525 98.7 F (37.1 C)     Temp Source 04/10/16 1525 Oral     SpO2 04/10/16 1525 96 %     Weight 04/10/16 1526 195 lb (88.5 kg)     Height 04/10/16 1526 5\' 2"  (1.575 m)     Head Circumference --      Peak Flow --      Pain Score 04/10/16 1526 6     Pain Loc --      Pain Edu? --      Excl. in [place]? --     Constitutional: Alert and oriented. Well appearing and in no acute distress. Eyes: Conjunctivae are normal. PERRL. EOMI. Head: Atraumatic. Nose: No congestion/rhinnorhea. Mouth/Throat: Mucous membranes are moist.  Oropharynx non-erythematous. Neck: No stridor.   Nontender with no meningismus Cardiovascular: Tachycardia,, regular rhythm. Grossly normal heart sounds.   Good peripheral circulation. Respiratory: Normal respiratory effort.  No retractions. Lungs CTAB. Abdominal: Soft and nontender. No distention. No guarding no rebound Back:  There is no focal tenderness or step off.  there is no midline tenderness there are no lesions noted. there is no CVA tenderness Musculoskeletal: No lower extremity tenderness, no upper extremity tenderness. No joint effusions, no DVT signs strong distal pulses no edema Neurologic:  Normal speech and language. No gross focal neurologic deficits are appreciated.  Skin:  Skin is warm, dry and intact. No rash noted. Psychiatric: Mood and affect are normal. Speech and behavior are normal.  ____________________________________________   LABS (all labs ordered are listed, but only abnormal results are displayed)  Labs Reviewed  TSH  CBC WITH DIFFERENTIAL/PLATELET  BASIC METABOLIC PANEL  TROPONIN I  BRAIN NATRIURETIC PEPTIDE  MAGNESIUM   ____________________________________________  EKG  I personally interpreted any EKGs ordered by me or triage Patient what appears to be a flutter, rate is 153 and in very until the monitor. On the EKG shows 152 no acute ST elevation or depression nonspecific ST changes are noted. Normal axis, QTC is elevated at 528.  Sinus rhythm at 90 bpm no acute ST elevation or acute ST depression normal axis, long QTc noted. ____________________________________________  [id number]  I reviewed any imaging ordered by me or triage that were performed during my shift and, if possible, patient and/or family made aware of any abnormal findings.   ____________________________________________   PROCEDURES  Procedure(s) performed: None  Procedures  Critical Care performed: None  ____________________________________________   INITIAL IMPRESSION / ASSESSMENT AND PLAN / ED COURSE  Pertinent labs & imaging results that were available during my care of the patient were reviewed by me and  considered in my medical decision making (see chart for details).   ----------------------------------------- 3:40 PM on 04/10/2016 -----------------------------------------  We  have paged on-call cardiology for this patient, awaiting callback. ----------------------------------------- 3:47 PM on 04/10/2016 -----------------------------------------  Dr. Josefa Half of cardiology feels that diltiazem is the best medication for this. We discussed the patient's long QTC.  ----------------------------------------- 4:02 PM on 04/10/2016 -----------------------------------------  Patient merely converted out of atrial flutter with Cardizem. She is now in sinus rhythm has no symptoms or complaints.  ----------------------------------------- 5:35 PM on 04/10/2016 -----------------------------------------  Patient with no complaints, we are going to discharge him. Did discuss with Dr. Bernita Buffy again, who agrees with management and discharge. He did ask me to continue the patient on low-dose beta blocker. We did review her med list together. The patient's long QT is also known to cardiology and they will follow-up. Patient has no symptoms or complaints. Her potassium is slightly low however she is now a few days from her next dialysis and I do not feel comfortable aggressively repleted here. I do not think that caused her symptoms. We will discharge her home with close outpatient follow-up patient very comfortable with this plan.    Clinical Course    ____________________________________________   FINAL CLINICAL IMPRESSION(S) / ED DIAGNOSES  Final diagnoses:  Tachycardia      This chart was dictated using voice recognition software.  Despite best efforts to proofread,  errors can occur which can change meaning.      Schuyler Amor, MD 04/10/16 1547    Schuyler Amor, MD 04/10/16 [place], MD 04/10/16 [place], MD 04/10/16 [phone]

## 2016-04-16 ENCOUNTER — Telehealth: Payer: Self-pay | Admitting: Neurology

## 2016-04-16 ENCOUNTER — Encounter: Payer: Self-pay | Admitting: Neurology

## 2016-04-16 ENCOUNTER — Ambulatory Visit (INDEPENDENT_AMBULATORY_CARE_PROVIDER_SITE_OTHER): Payer: Medicare Other | Admitting: Neurology

## 2016-04-16 DIAGNOSIS — R270 Ataxia, unspecified: Secondary | ICD-10-CM | POA: Diagnosis not present

## 2016-04-16 DIAGNOSIS — R404 Transient alteration of awareness: Secondary | ICD-10-CM | POA: Diagnosis not present

## 2016-04-16 DIAGNOSIS — R4789 Other speech disturbances: Secondary | ICD-10-CM | POA: Diagnosis not present

## 2016-04-16 MED ORDER — METOPROLOL SUCCINATE ER 50 MG PO TB24: 50.0000 mg | ORAL_TABLET | Freq: Two times a day (BID) | ORAL | 11 refills | Status: DC

## 2016-04-16 MED ORDER — ETHOSUXIMIDE 250 MG/5ML PO SOLN: ORAL | 3 refills | Status: DC

## 2016-04-16 MED ORDER — CARBAMAZEPINE ER 100 MG PO TB12: ORAL_TABLET | ORAL | 10 refills | Status: DC

## 2016-04-16 MED ORDER — LAMOTRIGINE 200 MG PO TABS: 300.0000 mg | ORAL_TABLET | Freq: Two times a day (BID) | ORAL | 3 refills | Status: DC

## 2016-04-16 NOTE — Telephone Encounter (Signed)
Patient came back after checking out to say that her height needs to be changed in her chart. She is 5 feet 2.5 inches. (not 5 feet 8 inches) Thanks!

## 2016-04-16 NOTE — Telephone Encounter (Signed)
I have changed this in her chart

## 2016-04-16 NOTE — Progress Notes (Signed)
[facility]   Provider:  Dr Jamison Soward Referring Provider: / Primary Care Physician:  Idelle Crouch, MD  Recent HA- chief complaint.   HPI:  Nina Johnston is a 55 y.o. female here for a RV :   This  Caucasian  right-handed , single female has been followed in this practice since 2009.  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  She had such spells in August 2010 and September 2011 after carbamazepine was changed to a generic form of Carbatrol her blood levels were lower,  she also developed upper respiratory tract infections and pneumonia in 2011 and was unable for a while to use her CPAP which had been initiated in [place] for the treatment of obstructive sleep apnea. Paroxysmal events with mental status changes had continued,  03/17/2011  and 20 13 some of the spells are described as " automatisms"- continuing behavior while  the patient is staring off and acting without  reflecting not being  responsive to stimulation from the outside . After the spell passes, she became very sleepy each time. In August 2012 she was finally referred to an endocrinologist at [facility] after she had a hypoglycemic seizure and her diabetes medications were reduced, now her blood sugars are running between 150 and 200 in the morning,  fasting.Nina Johnston begun working out in 2013 with a Physiological scientist and was able to lose about 30 pounds . She returned to using her CPAP.her Hb A1 C. in October of last year was 6.1- she had no other hypoglycemic events reported since March 2013 . Also in 2013 she had to visit the local ER twice for abdominal spasms and cardiac / chest pain.  negative work up. She was developing renal failure.  She has since been followed for a decreased renal clearance by internal medicine and nephrology. The patient is treated for her spells  and remains on Tegretol. It she will need blood levels for her anti-epiletic medications. Dr. Geryl Councilman is debating to reduce her Klonopin , which may help reduce her fall risk as well. Vit D deficiency- takes supplement.    Revisit from 09-06-14 Nina Johnston has been on a renal diet and has lost 45 pounds. She was finally evaluated and a AV fistula placed earlier this year. She has lost little bit of grip strength in the left hand but otherwise is doing well. Looking at her [facility] laboratory results creatinine was 4.6 calcium was 8.6 phosphorus was 5.6. Albumin was normal. Hematocrit was 7.9 she does have hyperparathyroidism at 202 her thyroid hormone units. Iron ferritin were normal range. He continues to take phosphate binding medication 3 times a day. Also looked at her red blood cell count today is 2.67 as of November last year. Her creatinine was 4.11 as of November last year when Tested in our lab at [place] her medication list. She states on the medical Toprol and Tegretol. She still on ethosuximide. The patient just resumed driving last year and for this reason I will leave her on 3 antiepileptic drugs also clinically I would be leaning towards weaning her off ethosuximide. It may be best to have either the transplant or the hemodialysis started and the metabolic panel stabilized  before changing. I will today refill her Klonopin, ethosuximide, Tegretol and Lamictal   Revisit from 02-12-15; Nina Johnston's renal function has further declined since her last visit, and I reviewed her [facility] laboratories today which were dated 01/05/2015. Her hemoglobin was 8.1 and hematocrit is 25.1 and creatinine is 5.6 phosphorous 4 is 6.5 and BUN is 108. She also had a potassium level of 5.1 ferritin was 183 total iron  binding capacity was 326 the mean platelet volume is decreased to 12.2 the mean red blood cell volume is 106. She is clearly iron deficient. Since Nina Johnston also suffers from a seizure disorder and severe diabetes and I have also followed her for a sleep apnea, I'm aware of the overlapping complications between medication and her neurologic dysfunctions. Procrit appeared to have lowered her seizure threshold (she was given this medication in2012 ) and she is safer to  resume iron infusions with her nephrologist.  However, she was told that repeated iron infusions may render her less able to accept a new kidney or increase her risk of rejection of the transplanted organ. Her second AV fistula for dialysis access was placed July 26th. The first one matured but never had enough flow.   If possible I would like for her to try Epogen (instead of procrit) -  it may affect her differently and I think that we can increase her Tegretol level, but remain on Lamictal and ethosuximide at the current doses.   I want to do this as long as the patient will need a erythropoietin-based medication to avoid lowering the seizure threshold.  She also appears by her skin turgor quite dehydrated today- and she does have a slightly bronzed skin of the renal insufficiency patient. She is needing to progress to haemo-dialysis while waiting. Needs Vit D and B 12 supplements.   10-22-2015, Patient is here today separately having had to major renovations to her aterial venous fistula with dialysis access in the left arm, her nondominant side. Laboratory shows albumin to be 4.0, hemoglobin to be 7.1, calcium corrected 9.1 phosphorus 5.3 PTH 466, potassium 4.9 dialysis volume and speed 2.12. Her blood glucose levels fasting in the morning unfortunately a poorly controlled and up almost every day to 232 250 level. She will discuss this with her primary care provider soon. The patient also asked me to refill her seizure medication, she  will need Klonopin 1 mg at bedtime, Tegretol-XR 100 mg 2 in the morning one at bedtime, Lamictal 200 mg taking 1-1/2 tablets in a.m. and p.m. 300 twice a day. Using GLAXO name brand only. dis continued to use Calcitrol, magnesium oxide, ferrous sulfate, sodium bicarbonate. during appointments at the dialysis center she also receives iron infusions on Tuesdays. Routine Rv for refills. Patient is in the process for a renal transplant at [facility]. Med Record number :  [id number]  Nina Johnston renal function has declined over the last 3-1/2 years. She is not considered end chronic renal disease stage III. She developed anemia.  Her diabetes is better controlled and her HTN is the main factor in her ESRD. She goes twice at night to the bathroom.  She doesn't report diaphoresis, no falls and no dizziness. She had a spell of frequent headaches.  Note contact at [facility]; transplant. Dr . Talmadge Coventry,  [facility] [place] [phone].   History from 04/16/2016. [name] is here today for a routine revisit but she did have some healthcare over the last week. She is now  on hemodialysis, and experienced bleeding at the AV fistula of her left arm during the last treatment. She notice that the patella was blood drenched but her treatment time was almost over. She also had to be evaluated at the local hospital emergency room for a malignant blood pressure peak. Her systolic blood pressure went up , she had a very high heart rate between 150s and 160 bpm. The ED suspected that she had atrial flutter, and she had hypokalemia. Hgb was 8.2.  on last Monday she was seen by cardiology and told she had sinus tachycardia. Chest Xray as she coughed blood. mammogram was due, normal, and potassium /agnesium was normalized.   Review of Systems: Out of a complete 14 system review, the patient complains of only the following symptoms:   Decreasing renal clearance. Recurrent convulsions due to hypoglycemia.  Hypokalemia due to CRF, CKD  grade 5,  Morbidly obese.  No driving allowed.  Keep on anti epileptic meds.    High fall risk @10  points, none of the points due to  age , 5 from medications.    Dry weight 88 .   Social History   Social History  . Marital status: Single    Spouse name: N/A  . Number of children: 0  . Years of education:  college   Occupational History  . not employed    Social History Main Topics  . Smoking status: Never Smoker  . Smokeless tobacco: Never Used  . Alcohol use No  . Drug use: No  . Sexual activity: Not Currently   Other Topics Concern  . Not on file   Social History Narrative   Patient is single and lives with her parents.   Patient is disabled.   Patient has a college education.   Patient is right- handed.   Patient drinks tea occasionally. 2 glasses of tea when they eat out.    Family History  Problem Relation Age of Onset  . Diabetes Mother   . Lumbar disc disease Mother   . Migraines Mother   . Hypertension Mother   . Cancer - Prostate Father     mild  . Diabetes Father   . Hypertension Father   . Breast cancer Neg Hx   . Ovarian cancer Neg Hx   . Colon cancer Neg Hx   . Heart disease Neg Hx     Past Medical History:  Diagnosis Date  . Acute bacterial sinusitis 04/19/2014  . Anxiety and depression   . Asthma   . 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 Benign essential HTN 12/26/2014  . Bipolar disorder ([place])   . Bronchitis   . Carotid artery narrowing 12/22/2014  . CKD (chronic kidney disease)   . Concussion 01-14-14   Fall   . COPD (chronic obstructive pulmonary disease) ([place])   . Diabetes mellitus without complication ([place])   . Diplopia   . Essential (primary) hypertension 02/12/2015  . Family history of thyroid problem   . GERD (gastroesophageal reflux disease)   . Heart disease    enlarged because of COPD  . Heart murmur   . HLD (hyperlipidemia)   . Hyperkalemia    . Hypertension   . Hyperthyroidism   . Morbid obesity ([place])   . Multiple thyroid nodules   . OAB (overactive bladder)   . Pernicious anemia   . Post-concussion syndrome 03/08/2014  . Renal disease  w/ GFR 29-may be due to diabetes  . Seizures ([place])   . Sleep apnea   . 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 Transient alteration of awareness 11/10/2012  . Urinary incontinence with continuous leakage 03/08/2014    Past Surgical History:  Procedure Laterality Date  . AVF    . CHOLECYSTECTOMY    . ENDOMETRIAL BIOPSY     ablation, uterine  . HERNIA REPAIR    . PERIPHERAL VASCULAR CATHETERIZATION N/A 08/23/2015   Procedure: Dialysis/Perma Catheter Insertion;  Surgeon: Algernon Huxley, MD;  Location: [facility];  Service: Cardiovascular;  Laterality: N/A;  . PERIPHERAL VASCULAR CATHETERIZATION N/A 11/09/2015   Procedure: Dialysis/Perma Catheter Removal;  Surgeon: Katha Cabal, MD;  Location: [facility];  Service: Cardiovascular;  Laterality: N/A;  . PORT A CATH REVISION      Current Outpatient Prescriptions  Medication Sig Dispense Refill  . amLODipine (NORVASC) 10 MG tablet Take 10 mg by mouth daily.    Marland Kitchen aspirin 81 MG tablet Take 81 mg by mouth daily.     Marland Kitchen b complex vitamins tablet Take 1 tablet by mouth daily.    . carbamazepine (TEGRETOL-XR) 100 MG 12 hr tablet Two tablets in the morning and one tablet in the evening. 90 tablet 10  . cefdinir (OMNICEF) 300 MG capsule Take 300 mg by mouth daily as needed. Reported on 06/06/2015    . chlorpheniramine-HYDROcodone (TUSSIONEX PENNKINETIC ER) 10-8 MG/5ML SUER Take 5 mLs by mouth every 12 (twelve) hours as needed for cough.    . clonazePAM (KLONOPIN) 1 MG tablet Take 1 tablet (1 mg total) by mouth at bedtime. 30 tablet 5  . cyanocobalamin (,VITAMIN B-12,) 1000 MCG/ML injection Inject 1,000 mcg into the muscle every 14 (fourteen) days.     Marland Kitchen dextromethorphan (DELSYM) 30 MG/5ML liquid Take 60 mg by mouth as needed for cough.     Mariane Baumgarten Calcium (STOOL SOFTENER PO) Take 1 capsule by mouth every morning.     Marland Kitchen ethosuximide (ZARONTIN) 250 MG/5ML solution TAKE 5ML BY MOUTH 2 TIMES DAILY 474 mL 3  . Febuxostat (ULORIC) 80 MG TABS Take 1 tablet by mouth daily. Morning dosage     . fexofenadine (ALLEGRA) 180 MG tablet Take 180 mg by mouth daily. am    . fluconazole (DIFLUCAN) 150 MG tablet Take 1 tablet (150 mg total) by mouth daily. (Patient taking differently: Take 150 mg by mouth daily as needed. ) 1 tablet 0  . Fluticasone-Salmeterol (ADVAIR DISKUS) 250-50 MCG/DOSE AEPB Inhale 1 puff into the lungs 2 (two) times daily.    . folic acid-vitamin b complex-vitamin c-selenium-zinc (DIALYVITE) 3 MG TABS tablet Take 1 tablet by mouth daily.    . furosemide (LASIX) 20 MG tablet Take 30 mg by mouth 2 (two) times daily.    Marland Kitchen gemfibrozil (LOPID) 600 MG tablet Take 600 mg by mouth 2 (two) times daily before a meal.    . glipiZIDE (GLUCOTROL) 5 MG tablet Take 5-10 mg by mouth See admin instructions. 10 mg in the morning, and 5 mg at dinner    . GuaiFENesin (MUCINEX PO) Take 1 tablet by mouth 2 (two) times daily as needed. Expectorant, as needed     . hydrALAZINE (APRESOLINE) 50 MG tablet Take 50 mg by mouth 2 (two) times daily.     . insulin glargine (LANTUS) 100 UNIT/ML injection Inject 10 Units into the skin at bedtime.     . Insulin Pen Needle (BD PEN NEEDLE  NANO U/F) 32G X 4 MM MISC     . lamoTRIgine (LAMICTAL) 200 MG tablet Take 1.5 tablets (300 mg total) by mouth 2 (two) times daily. Brand Medically Necessary 270 tablet 3  . levothyroxine (SYNTHROID) 150 MCG tablet Take 150 mcg by mouth daily. Am dosage    . metoprolol succinate (TOPROL XL) 50 MG 24 hr tablet Take 50 mg by mouth 2 (two) times daily. Take with or immediately following a meal.    . montelukast (SINGULAIR) 10 MG tablet Take 10 mg by mouth at bedtime.    . pantoprazole (PROTONIX) 40 MG tablet Take 40 mg by mouth daily.    . predniSONE (DELTASONE) 10 MG tablet  Take 10 mg by mouth daily as needed. Reported on 06/06/2015    . Probiotic Product (ALIGN) 4 MG CAPS Take 1 capsule by mouth daily.    . sevelamer carbonate (RENVELA) 800 MG tablet Take 800-3,200 mg by mouth See admin instructions. 4  Tablets 3 times daily with meals, and 1 tablet with snacks    . triamcinolone (NASACORT ALLERGY 24HR) 55 MCG/ACT AERO nasal inhaler Place 2 sprays into the nose at bedtime.     No current facility-administered medications for this visit.     Allergies as of 04/16/2016 - Review Complete 04/16/2016  Allergen Reaction Noted  . Cinnamon  11/09/2012  . Procrit [epoetin alfa]  11/10/2012  . Ace inhibitors Cough 09/06/2014  . Azithromycin  09/06/2014  . Iron  03/31/2016  . Neomycin-bacitracin zn-polymyx  09/06/2014  . Sulfa antibiotics  09/06/2014    Vitals: BP (!) 160/62   Pulse 72   Resp 16   Ht 5\' 8"  (1.727 m)   Wt 193 lb (87.5 kg)   BMI 29.35 kg/m  Last Weight:  Wt Readings from Last 1 Encounters:  04/16/16 193 lb (87.5 kg)   Last Height:   Ht Readings from Last 1 Encounters:  04/16/16 5\' 8"  (1.727 m)   Vision Screening:   Physical exam:  General: The patient is awake, alert and appears not in acute distress. The patient is well groomed. Head: Normocephalic, atraumatic. Neck is supple.  Mallampati 1 - there is no uvula , neck circumference:15.5 inches.   On renal low potassium diet, she lost 45 pounds .  Cardiovascular:  Regular rate and rhythm, without  murmurs or carotid bruit, and without distended neck veins. Respiratory: Lungs are clear to auscultation. Skin: ankle  edema, no rash. Looking jaundiced, bronzed .  Dry skin, low TURGOR, appears dehydrated and dusky. .  Trunk: BMI is morbidly obese-  Abdominal hernia.  patient has normal posture.   She has lost weight.   Neurologic exam : The patient is awake and alert, oriented to place and time. Memory subjective described as intact.  There is a normal attention span & concentration  ability. Speech is fluent without dysarthria or aphasia. Mood and affect are appropriate. Cranial nerves: Pupils are equal and briskly reactive to light.  Visual fields by finger perimetry are intact.Hearing to finger rub intact. Facial sensation intact to fine touch. Facial motor strength is symmetric and tongue moves midline. Motor exam:  Normal tone and normal muscle bulk, obesity related attenuated DTR , and symmetric normal strength in all extremities. She has an AV access fistula on the left arm. Thrill.  Sensory:  Fine touch, pinprick and vibration were tested in all extremities and are presents in toes and feet. Proprioception is tested and normal. Coordination: Rapid alternating movements in the fingers/hands is  tested and normal. Finger-to-nose maneuver tested and normal without evidence of ataxia, dysmetria or tremor. Gait and station: Patient walks without assistive device and is very slow - Appears truncally rigid, with no lumbar rotation, turns with 5 steps, and has reduced step width. Strength within normal limits. Stance is stable and normal. Tandem gait is fragmented. She waddels.  Deep tendon reflexes: in the upper and lower extremities are symmetric and intact.  Babinski maneuver response is down going on the left and equivocal on the right.  Assessment:    Declining overall health, obesity, CKD, on hemodialysis.   on renal transplant waiting list.  AV fistula.     Plan:   See labs.   Rv every 6 month  Refills for Lamictal, Tegretol increased, Ethosuximide, Klonopin  . She has seen PCP Dr. Fulton Reek, [facility].  Dr Holley Raring, Nephrologist- on hemodialysis.    Tymier Lindholm, MD

## 2016-04-18 ENCOUNTER — Encounter (INDEPENDENT_AMBULATORY_CARE_PROVIDER_SITE_OTHER): Payer: Self-pay | Admitting: Vascular Surgery

## 2016-04-18 ENCOUNTER — Encounter (INDEPENDENT_AMBULATORY_CARE_PROVIDER_SITE_OTHER): Payer: Self-pay

## 2016-04-18 ENCOUNTER — Ambulatory Visit
Admission: RE | Admit: 2016-04-18 | Discharge: 2016-04-18 | Disposition: A | Discharge status: Home / Self Care | Payer: Medicare Other | Source: Ambulatory Visit | Attending: Internal Medicine | Admitting: Internal Medicine

## 2016-04-18 ENCOUNTER — Ambulatory Visit (INDEPENDENT_AMBULATORY_CARE_PROVIDER_SITE_OTHER): Payer: Medicare Other | Admitting: Vascular Surgery

## 2016-04-18 VITALS — BP 150/71 | HR 76 | Resp 16 | Ht 62.5 in | Wt 191.0 lb

## 2016-04-18 DIAGNOSIS — Y841 Kidney dialysis as the cause of abnormal reaction of the patient, or of later complication, without mention of misadventure at the time of the procedure: Secondary | ICD-10-CM | POA: Insufficient documentation

## 2016-04-18 DIAGNOSIS — Z992 Dependence on renal dialysis: Secondary | ICD-10-CM

## 2016-04-18 DIAGNOSIS — D6489 Other specified anemias: Secondary | ICD-10-CM | POA: Insufficient documentation

## 2016-04-18 DIAGNOSIS — I10 Essential (primary) hypertension: Secondary | ICD-10-CM | POA: Diagnosis not present

## 2016-04-18 DIAGNOSIS — N186 End stage renal disease: Secondary | ICD-10-CM | POA: Diagnosis not present

## 2016-04-18 DIAGNOSIS — M7989 Other specified soft tissue disorders: Secondary | ICD-10-CM | POA: Diagnosis not present

## 2016-04-18 DIAGNOSIS — E118 Type 2 diabetes mellitus with unspecified complications: Secondary | ICD-10-CM | POA: Diagnosis not present

## 2016-04-18 DIAGNOSIS — G40909 Epilepsy, unspecified, not intractable, without status epilepticus: Secondary | ICD-10-CM | POA: Diagnosis not present

## 2016-04-18 LAB — ABO/RH: ABO/RH(D): O POS

## 2016-04-18 LAB — PREPARE RBC (CROSSMATCH)

## 2016-04-18 MED ORDER — SODIUM CHLORIDE 0.9 % IV SOLN
Freq: Once | INTRAVENOUS | Status: AC
Start: 2016-04-18 — End: 2016-04-18
  Administered 2016-04-18: 12:00:00 via INTRAVENOUS

## 2016-04-18 NOTE — Assessment & Plan Note (Signed)
Currently wearing her compression stockings as previously recommended. This is providing reasonable control of her symptoms. We'll recommend continued use of compression stockings, elevation, and increased activity.

## 2016-04-18 NOTE — Assessment & Plan Note (Signed)
The patient has end-stage renal disease and is on dialysis. He is currently having issues with her left arm AV fistula in terms of prolonged bleeding. I would recommend a fistulagram and possible intervention be performed at her convenience for further evaluation of this issue.  Risks and benefits are discussed.

## 2016-04-18 NOTE — Assessment & Plan Note (Signed)
blood glucose control important in reducing the progression of atherosclerotic disease. Also, involved in wound healing. On appropriate medications.  

## 2016-04-18 NOTE — Patient Instructions (Signed)
Dialysis Dialysis is a procedure that replaces some of the work healthy kidneys do. It is done when you lose about 85-90% of your kidney function. It may also be done earlier if your symptoms may be improved by dialysis. During dialysis, wastes, salt, and extra water are removed from the blood, and the levels of certain chemicals in the blood (such as potassium) are maintained. Dialysis is done in sessions. Dialysis sessions are continued until the kidneys get better. If the kidneys cannot get better, such as in end-stage kidney disease, dialysis is continued for life or until you receive a new kidney (kidney transplant). There are two types of dialysis: hemodialysis and peritoneal dialysis. What is hemodialysis? Hemodialysis is a type of dialysis in which a machine called a dialyzer is used to filter the blood. Before beginning hemodialysis, you will have surgery to create a site where blood can be removed from the body and returned to the body (vascular access). There are three types of vascular accesses:  Arteriovenous fistula. To create this type of access, an artery is connected to a vein (usually in the arm). A fistula takes 1-6 months to develop after surgery. If it develops properly, it usually lasts longer than the other types of vascular accesses. It is also less likely to become infected and cause blood clots.  Arteriovenous graft. To create this type of access, an artery and a vein in the arm are connected with a tube. A graft may be used within 2-3 weeks of surgery.  A venous catheter. To create this type of access, a thin, flexible tube (catheter) is placed in a large vein in your neck, chest, or groin. A catheter may be used right away. It is usually used as a temporary access when dialysis needs to begin immediately.  During hemodialysis, blood leaves the body through your access. It travels through a tube to the dialyzer, where it is filtered. The blood then returns to your body through  another tube. Hemodialysis is usually performed by a health care provider at a hospital or dialysis center three times a week. Visits last about 3-4 hours. It may also be performed with the help of another person at home with training. What is peritoneal dialysis? Peritoneal dialysis is a type of dialysis in which the thin lining of the abdomen (peritoneum) is used as a filter. Before beginning peritoneal dialysis, you will have surgery to place a catheter in your abdomen. The catheter will be used to transfer a fluid called dialysate to and from your abdomen. At the start of a session, your abdomen is filled with dialysate. During the session, wastes, salt, and extra water in the blood pass through the peritoneum and into the dialysate. The dialysate is drained from the body at the end of the session. The process of filling and draining the dialysate is called an exchange. Exchanges are repeated until you have used up all the dialysate for the day. Peritoneal dialysis may be performed by you at home or at almost any other location. It is done every day. You may need up to five exchanges a day. The amount of time the dialysate is in your body between exchanges is called a dwell. The dwell depends on the number of exchanges needed and the characteristics of the peritoneum. It usually varies from 1.5-3 hours. You may go about your day normally between exchanges. Alternately, the exchanges may be done at night while you sleep, using a machine called a cycler. Which type of   dialysis should I choose? Both hemodialysis and peritoneal dialysis have advantages and disadvantages. Talk to your health care provider about which type of dialysis would be best for you. Your lifestyle and preferences should be considered along with your medical condition. In some cases, only one type of dialysis may be an option. Advantages of hemodialysis  It is done less often than peritoneal dialysis.  Someone else can do the  dialysis for you.  If you go to a dialysis center, your health care provider will be able to recognize any problems right away.  If you go to a dialysis center, you can interact with others who are having dialysis. This can provide you with emotional support.  Disadvantages of hemodialysis  Hemodialysis may cause cramps and low blood pressure. It may leave you feeling tired on the days you have the treatment.  If you go to a dialysis center, you will need to make weekly appointments and work around the center's schedule.  You will need to take extra care when traveling. If you go to a dialysis center, you will need to make special arrangements to visit a dialysis center near your destination. If you are having treatments at home, you will need to take the dialyzer with you to your destination.  You will need to avoid more foods than you would need to avoid on peritoneal dialysis.  Advantages of peritoneal dialysis  It is less likely than hemodialysis to cause cramps and low blood pressure.  You may do exchanges on your own wherever you are, including when you travel.  You do not need to avoid as many foods as you do on hemodialysis.  Disadvantages of peritoneal dialysis  It is done more often than hemodialysis.  Performing peritoneal dialysis requires you to have dexterity of the hands. You must also be able to lift bags.  You will have to learn sterilization techniques. You will need to practice them every day to reduce the risk of infection.  What changes will I need to make to my diet during dialysis? Both hemodialysis and peritoneal dialysis require you to make some changes to your diet. For example, you will need to limit your intake of foods high in the minerals phosphorus and potassium. You will also need to limit your fluid intake. Your dietitian can help you plan meals. A good meal plan can improve your dialysis and your health. What should I expect when beginning  dialysis? Adjusting to the dialysis treatment, schedule, and diet can take some time. You may need to stop working and may not be able to do some of the things you normally do. You may feel anxious or depressed when beginning dialysis. Eventually, many people feel better overall because of dialysis. Some people are able to return to work after making some changes, such as reducing work intensity. Where can I find more information?  [employer]: www.kidney.org  American Association of Kidney Patients: www.aakp.org  [employer]: www.kidneyfund.org This information is not intended to replace advice given to you by your health care provider. Make sure you discuss any questions you have with your health care provider. Document Released: 07/12/2002 Document Revised: 09/27/2015 Document Reviewed: 06/15/2012 [employer] Interactive Patient Education  2017 [employer]  

## 2016-04-18 NOTE — Progress Notes (Signed)
Patient ID: Nina Johnston, female   DOB: April 01, 1961, 55 y.o.   MRN: [id number]  Chief Complaint  Patient presents with  . New Evaluation    Access prolonged bleeding    HPI Nina Johnston is a 55 y.o. female.  I am asked to see the patient by Dr. Holley Raring for evaluation of prolonged bleeding from her AVF on the left arm.  The patient reports Multiple episodes of prolonged bleeding immediately after dialysis or even into the next day. This is now being going on for weeks to months. There is no clear inciting event or causative factor. She denies any trauma or injury. This is a long-standing fistula and has been her dialysis access. They have done a pretty good job of rotating her access sites and not creating an aneurysmal fistula at her dialysis center. Her bleeding has become quite a significant problem, and it has now left her with a hemoglobin of less than 7. She reports they're planning a blood transfusion at her next dialysis treatment. She also has previously been seen for leg swelling. We recommend she wear compression stockings which she has been doing. She has intermittent episodes where the swelling worsens, but currently it is under reasonably good control with her compression stockings. She elevates her legs frequently as well.   Past Medical History:  Diagnosis Date  . Acute bacterial sinusitis 04/19/2014  . Anxiety and depression   . Asthma   . 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 Benign essential HTN 12/26/2014  . Bipolar disorder ([place])   . Bronchitis   . Carotid artery narrowing 12/22/2014  . CKD (chronic kidney disease)   . Concussion 01-14-14   Fall   . COPD (chronic obstructive pulmonary disease) ([place])   . Diabetes mellitus without complication ([place])   . Diplopia   . Essential (primary) hypertension 02/12/2015  . Family history of thyroid problem   . GERD (gastroesophageal reflux disease)    . Heart disease    enlarged because of COPD  . Heart murmur   . HLD (hyperlipidemia)   . Hyperkalemia   . Hypertension   . Hyperthyroidism   . Morbid obesity ([place])   . Multiple thyroid nodules   . OAB (overactive bladder)   . Pernicious anemia   . Post-concussion syndrome 03/08/2014  . Renal disease    w/ GFR 29-may be due to diabetes  . Seizures ([place])   . Sleep apnea   . 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 Transient alteration of awareness 11/10/2012  . Urinary incontinence with continuous leakage 03/08/2014    Past Surgical History:  Procedure Laterality Date  . AVF    . CHOLECYSTECTOMY    . ENDOMETRIAL BIOPSY     ablation, uterine  . HERNIA REPAIR    . PERIPHERAL VASCULAR CATHETERIZATION N/A 08/23/2015   Procedure: Dialysis/Perma Catheter Insertion;  Surgeon: Algernon Huxley, MD;  Location: [facility];  Service: Cardiovascular;  Laterality: N/A;  . PERIPHERAL VASCULAR CATHETERIZATION N/A 11/09/2015   Procedure: Dialysis/Perma Catheter Removal;  Surgeon: Katha Cabal, MD;  Location: [facility];  Service: Cardiovascular;  Laterality: N/A;  . PORT A CATH REVISION      Family History  Problem Relation Age of Onset  . Diabetes Mother   . Lumbar disc disease Mother   . Migraines Mother   . Hypertension Mother   .  Cancer - Prostate Father     mild  . Diabetes Father   . Hypertension Father   . Breast cancer Neg Hx   . Ovarian cancer Neg Hx   . Colon cancer Neg Hx   . Heart disease Neg Hx      Social History Social History  Substance Use Topics  . Smoking status: Never Smoker  . Smokeless tobacco: Never Used  . Alcohol use No  No IV drug use  Allergies  Allergen Reactions  . Cinnamon     Other reaction(s): Unknown  . Procrit [Epoetin Alfa]     Other reaction(s): Other (See Comments) Seizures, grand mal Seizures, grand mal  . Ace Inhibitors Cough  . Azithromycin     Other reaction(s): Other (See Comments) seizures  . Iron     Can not  take in IV Form - causes seizures   . Neomycin-Bacitracin Zn-Polymyx     Other reaction(s): Unknown  . Sulfa Antibiotics     Other reaction(s): Other (See Comments) Due to risks for seizures.    Current Outpatient Prescriptions  Medication Sig Dispense Refill  . amLODipine (NORVASC) 10 MG tablet Take 10 mg by mouth daily.    Marland Kitchen aspirin 81 MG tablet Take 81 mg by mouth daily.     Marland Kitchen b complex vitamins tablet Take 1 tablet by mouth daily.    . carbamazepine (TEGRETOL-XR) 100 MG 12 hr tablet Two tablets in the morning and one tablet in the evening. 90 tablet 10  . cefdinir (OMNICEF) 300 MG capsule Take 300 mg by mouth daily as needed. Reported on 06/06/2015    . chlorpheniramine-HYDROcodone (TUSSIONEX PENNKINETIC ER) 10-8 MG/5ML SUER Take 5 mLs by mouth every 12 (twelve) hours as needed for cough.    . clonazePAM (KLONOPIN) 1 MG tablet Take 1 tablet (1 mg total) by mouth at bedtime. 30 tablet 5  . cyanocobalamin (,VITAMIN B-12,) 1000 MCG/ML injection Inject 1,000 mcg into the muscle every 14 (fourteen) days.     Marland Kitchen dextromethorphan (DELSYM) 30 MG/5ML liquid Take 60 mg by mouth as needed for cough.    Mariane Baumgarten Calcium (STOOL SOFTENER PO) Take 1 capsule by mouth every morning.     Marland Kitchen ethosuximide (ZARONTIN) 250 MG/5ML solution TAKE 5ML BY MOUTH 2 TIMES DAILY 474 mL 3  . Febuxostat (ULORIC) 80 MG TABS Take 1 tablet by mouth daily. Morning dosage     . fexofenadine (ALLEGRA) 180 MG tablet Take 180 mg by mouth daily. am    . fluconazole (DIFLUCAN) 150 MG tablet Take 1 tablet (150 mg total) by mouth daily. (Patient taking differently: Take 150 mg by mouth daily as needed. ) 1 tablet 0  . Fluticasone-Salmeterol (ADVAIR DISKUS) 250-50 MCG/DOSE AEPB Inhale 1 puff into the lungs 2 (two) times daily.    . folic acid-vitamin b complex-vitamin c-selenium-zinc (DIALYVITE) 3 MG TABS tablet Take 1 tablet by mouth daily.    . furosemide (LASIX) 20 MG tablet Take 30 mg by mouth 2 (two) times daily.    Marland Kitchen  gemfibrozil (LOPID) 600 MG tablet Take 600 mg by mouth 2 (two) times daily before a meal.    . glipiZIDE (GLUCOTROL) 5 MG tablet Take 5-10 mg by mouth See admin instructions. 10 mg in the morning, and 5 mg at dinner    . GuaiFENesin (MUCINEX PO) Take 1 tablet by mouth 2 (two) times daily as needed. Expectorant, as needed     . hydrALAZINE (APRESOLINE) 50 MG tablet Take 50 mg  by mouth 2 (two) times daily.     . insulin glargine (LANTUS) 100 UNIT/ML injection Inject 10 Units into the skin at bedtime.     . Insulin Pen Needle (BD PEN NEEDLE NANO U/F) 32G X 4 MM MISC     . lamoTRIgine (LAMICTAL) 200 MG tablet Take 1.5 tablets (300 mg total) by mouth 2 (two) times daily. Brand Medically Necessary 270 tablet 3  . levothyroxine (SYNTHROID) 150 MCG tablet Take 150 mcg by mouth daily. Am dosage    . metoprolol succinate (TOPROL XL) 50 MG 24 hr tablet Take 1 tablet (50 mg total) by mouth 2 (two) times daily. Take with or immediately following a meal. 60 tablet 11  . montelukast (SINGULAIR) 10 MG tablet Take 10 mg by mouth at bedtime.    . pantoprazole (PROTONIX) 40 MG tablet Take 40 mg by mouth daily.    . predniSONE (DELTASONE) 10 MG tablet Take 10 mg by mouth daily as needed. Reported on 06/06/2015    . Probiotic Product (ALIGN) 4 MG CAPS Take 1 capsule by mouth daily.    . sevelamer carbonate (RENVELA) 800 MG tablet Take 800-3,200 mg by mouth See admin instructions. 4  Tablets 3 times daily with meals, and 1 tablet with snacks    . triamcinolone (NASACORT ALLERGY 24HR) 55 MCG/ACT AERO nasal inhaler Place 2 sprays into the nose at bedtime.     No current facility-administered medications for this visit.       REVIEW OF SYSTEMS (Negative unless checked)  Constitutional: _0 Weight loss  _1 Fever  _2 Chills Cardiac: _3 Chest pain   _4 Chest pressure   _5 Palpitations   _6 Shortness of breath when laying flat   _7 Shortness of breath at rest   _8 Shortness of breath with exertion. Vascular:  _9 Pain in legs with  walking   _10 Pain in legs at rest   _11 Pain in legs when laying flat   _12 Claudication   _13 Pain in feet when walking  _14 Pain in feet at rest  _15 Pain in feet when laying flat   _16 History of DVT   _17 Phlebitis   _18 Swelling in legs   _19 Varicose veins   _20 Non-healing ulcers Pulmonary:   _21 Uses home oxygen   _22 Productive cough   _23 Hemoptysis   _24 Wheeze  _25 COPD   _26 Asthma Neurologic:  _27 Dizziness  _28 Blackouts   _29 Seizures   _30 History of stroke   _31 History of TIA  _32 Aphasia   _33 Temporary blindness   _34 Dysphagia   _35 Weakness or numbness in arms   _36 Weakness or numbness in legs Musculoskeletal:  _37 Arthritis   _38 Joint swelling   _39 Joint pain   _40 Low back pain Hematologic:  _41 Easy bruising  _42 Easy bleeding   _43 Hypercoagulable state   _44 Anemic  _45 Hepatitis Gastrointestinal:  _46 Blood in stool   _47 Vomiting blood  _48 Gastroesophageal reflux/heartburn   _49 Abdominal pain Genitourinary:  _50 Chronic kidney disease   _51 Difficult urination  _52 Frequent urination  _53 Burning with urination   _54 Hematuria Skin:  _55 Rashes   _56 Ulcers   _57 Wounds Psychological:  _58 History of anxiety   _59  History of major depression.    Physical Exam BP (!) 150/71 (BP Location: Right Arm)   Pulse 76   Resp 16   Ht 5' 2.5" (1.588 m)   Wt 191 lb (86.6 kg)   BMI 34.38 kg/m  Gen:  WD/WN, NAD Head: [place]/AT, No temporalis wasting. Prominent temp pulse not noted. Ear/Nose/Throat: Hearing grossly intact, nares w/o erythema or drainage, oropharynx w/o Erythema/Exudate Eyes: Conjunctiva clear, sclera non-icteric  Neck: trachea midline.  No bruit or JVD.  Pulmonary:  Good air movement, clear to auscultation bilaterally.  Cardiac: RRR, normal S1, S2, no Murmurs, rubs or gallops. Vascular: good thrill and bruit present in left radiocephalic AVF Vessel Right Left  Radial Palpable Palpable  Ulnar Palpable Palpable  Brachial Palpable Palpable  Carotid Palpable, without bruit Palpable, without bruit  Aorta Not palpable N/A  Femoral Palpable Palpable    Popliteal Palpable Palpable  PT 1+ Palpable 1+ Palpable  DP Palpable Palpable   Gastrointestinal: soft, non-tender/non-distended. No guarding/reflex. No masses, surgical incisions, or scars. Musculoskeletal: M/S 5/5 throughout.  Extremities without ischemic changes.  No deformity or atrophy. 1+ BLE edema. Neurologic: Sensation grossly intact in extremities.  Symmetrical.  Speech is fluent. Motor exam as listed above. Psychiatric: Judgment intact, Mood & affect appropriate for pt's clinical situation. Dermatologic: No rashes or ulcers noted.  No cellulitis or open wounds. Lymph : No Cervical, Axillary, or Inguinal lymphadenopathy.   Radiology Dg Chest Port 1 View  Result Date: 04/10/2016 CLINICAL DATA:  Increased heart rate while receiving dialysis today history of COPD and chronic kidney disease EXAM: PORTABLE CHEST 1 VIEW COMPARISON:  Chest x-ray of 03/07/2016 FINDINGS: Moderate cardiomegaly is stable. There may be very mild pulmonary vascular congestion present. No focal infiltrate or effusion is seen. No bony abnormality is noted. IMPRESSION: Stable cardiomegaly with questionable mild pulmonary vascular congestion. Electronically Signed   By: Dwyane Dee M.D.   On: 04/10/2016 16:03   Mm Screening Breast Tomo Bilateral  Result Date: 03/24/2016 CLINICAL DATA:  Screening. EXAM: 2D DIGITAL SCREENING BILATERAL MAMMOGRAM WITH CAD AND ADJUNCT TOMO COMPARISON:  Previous exam(s). ACR Breast Density Category b: There are scattered areas of fibroglandular density. FINDINGS: There are no findings suspicious for malignancy. Images were processed with CAD. IMPRESSION: No mammographic evidence of malignancy. A result letter of this screening mammogram will be mailed directly to the patient. RECOMMENDATION: Screening mammogram in one year. (Code:SM-B-01Y) BI-RADS CATEGORY  1: Negative. Electronically Signed   By: Edwin Cap M.D.   On: 03/24/2016 07:43    Labs Recent Results (from the past 2160  hour(s))  CBC     Status: Abnormal   Collection Time: 03/07/16  3:26 AM  Result Value Ref Range   WBC 8.7 3.6 - 11.0 K/uL   RBC 2.36 (L) 3.80 - 5.20 MIL/uL   Hemoglobin 7.9 (L) 12.0 - 16.0 g/dL   HCT 79.3 (L) 10.9 - 14.5 %   MCV 96.0 80.0 - 100.0 fL   MCH 33.4 26.0 - 34.0 pg   MCHC 34.7 32.0 - 36.0 g/dL   RDW 60.2 (H) 78.2 - 96.0 %   Platelets 193 150 - 440 K/uL  Basic metabolic panel     Status: Abnormal   Collection Time: 03/07/16  3:26 AM  Result Value Ref Range   Sodium 140 135 - 145 mmol/L   Potassium 2.9 (L) 3.5 - 5.1 mmol/L   Chloride 95 (L) 101 - 111 mmol/L   CO2 34 (H) 22 - 32 mmol/L   Glucose, Bld 99 65 - 99 mg/dL   BUN 19 6 - 20 mg/dL   Creatinine, Ser 3.90 (H) 0.44 - 1.00 mg/dL   Calcium 8.7 (L) 8.9 - 10.3 mg/dL   GFR calc non Af Amer 27 (L) >60 mL/min   GFR calc Af Amer 31 (L) >60 mL/min    Comment: (NOTE) The eGFR has been calculated using the CKD EPI equation. This calculation has not been validated in all clinical situations. eGFR's persistently <60 mL/min signify possible Chronic  Kidney Disease.    Anion gap 11 5 - 15  Troponin I     Status: None   Collection Time: 03/07/16  3:26 AM  Result Value Ref Range   Troponin I <0.03 <0.03 ng/mL  Fibrin derivatives D-Dimer ([facility] only)     Status: Abnormal   Collection Time: 03/07/16  3:26 AM  Result Value Ref Range   Fibrin derivatives D-dimer ([facility]) 1,548 (H) 0 - 499    Comment: <> Exclusion of Venous Thromboembolism (VTE) - OUTPATIENTS ONLY        (Emergency Department or [place])             0-499 ng/ml (FEU)  : With a low to intermediate pretest                                        probability for VTE this test result                                        excludes the diagnosis of VTE.           > 499 ng/ml (FEU)  : VTE not excluded.  Additional work up                                   for VTE is required.   <>  Testing on Inpatients and Evaluation of Disseminated Intravascular        Coagulation  ([facility])             Reference Range:   0-499 ng/ml (FEU)   Troponin I     Status: None   Collection Time: 03/07/16  7:16 AM  Result Value Ref Range   Troponin I <0.03 <0.03 ng/mL  TSH     Status: None   Collection Time: 04/10/16  3:39 PM  Result Value Ref Range   TSH 2.373 0.350 - 4.500 uIU/mL    Comment: Performed by a 3rd Generation assay with a functional sensitivity of <=0.01 uIU/mL.  CBC with Differential     Status: Abnormal   Collection Time: 04/10/16  3:39 PM  Result Value Ref Range   WBC 8.7 3.6 - 11.0 K/uL   RBC 2.28 (L) 3.80 - 5.20 MIL/uL   Hemoglobin 8.1 (L) 12.0 - 16.0 g/dL   HCT 23.1 (L) 35.0 - 47.0 %   MCV 101.6 (H) 80.0 - 100.0 fL   MCH 35.4 (H) 26.0 - 34.0 pg   MCHC 34.8 32.0 - 36.0 g/dL   RDW 15.9 (H) 11.5 - 14.5 %   Platelets 211 150 - 440 K/uL   Neutrophils Relative % 75 %   Neutro Abs 6.6 (H) 1.4 - 6.5 K/uL   Lymphocytes Relative 18 %   Lymphs Abs 1.5 1.0 - 3.6 K/uL   Monocytes Relative 5 %   Monocytes Absolute 0.5 0.2 - 0.9 K/uL   Eosinophils Relative 1 %   Eosinophils Absolute 0.1 0 - 0.7 K/uL   Basophils Relative 1 %   Basophils Absolute 0.1 0 - 0.1 K/uL  Basic metabolic panel     Status: Abnormal   Collection Time: 04/10/16  3:39 PM  Result Value Ref Range   Sodium 139 135 - 145 mmol/L  Potassium 3.0 (L) 3.5 - 5.1 mmol/L   Chloride 96 (L) 101 - 111 mmol/L   CO2 28 22 - 32 mmol/L   Glucose, Bld 176 (H) 65 - 99 mg/dL   BUN 16 6 - 20 mg/dL   Creatinine, Ser 1.98 (H) 0.44 - 1.00 mg/dL   Calcium 9.1 8.9 - 10.3 mg/dL   GFR calc non Af Amer 27 (L) >60 mL/min   GFR calc Af Amer 31 (L) >60 mL/min    Comment: (NOTE) The eGFR has been calculated using the CKD EPI equation. This calculation has not been validated in all clinical situations. eGFR's persistently <60 mL/min signify possible Chronic Kidney Disease.    Anion gap 15 5 - 15  Troponin I     Status: None   Collection Time: 04/10/16  3:39 PM  Result Value Ref Range   Troponin I <0.03 <0.03  ng/mL  Brain natriuretic peptide     Status: Abnormal   Collection Time: 04/10/16  3:39 PM  Result Value Ref Range   B Natriuretic Peptide 389.0 (H) 0.0 - 100.0 pg/mL  Magnesium     Status: None   Collection Time: 04/10/16  3:39 PM  Result Value Ref Range   Magnesium 2.0 1.7 - 2.4 mg/dL  Protime-INR     Status: None   Collection Time: 04/10/16  3:46 PM  Result Value Ref Range   Prothrombin Time 12.8 11.4 - 15.2 seconds   INR 0.96   APTT     Status: Abnormal   Collection Time: 04/10/16  3:46 PM  Result Value Ref Range   aPTT 37 (H) 24 - 36 seconds    Comment:        IF BASELINE aPTT IS ELEVATED, SUGGEST PATIENT RISK ASSESSMENT BE USED TO DETERMINE APPROPRIATE ANTICOAGULANT THERAPY.   Type and screen [place]     Status: None (Preliminary result)   Collection Time: 04/18/16 11:26 AM  Result Value Ref Range   ABO/RH(D) O POS    Antibody Screen NEG    Sample Expiration 04/21/2016    Unit Number [id number]    Blood Component Type RBC, LR IRR    Unit division 00    Status of Unit ALLOCATED    Transfusion Status OK TO TRANSFUSE    Crossmatch Result Compatible   Prepare RBC     Status: None   Collection Time: 04/18/16 11:26 AM  Result Value Ref Range   Order Confirmation ORDER PROCESSED BY BLOOD BANK   ABO/Rh     Status: None   Collection Time: 04/18/16 12:34 PM  Result Value Ref Range   ABO/RH(D) O POS     Assessment/Plan:  End-stage renal disease on hemodialysis ([place]) The patient has end-stage renal disease and is on dialysis. He is currently having issues with her left arm AV fistula in terms of prolonged bleeding. I would recommend a fistulagram and possible intervention be performed at her convenience for further evaluation of this issue.  Risks and benefits are discussed.    Type 2 diabetes mellitus (HCC) blood glucose control important in reducing the progression of atherosclerotic disease. Also, involved in wound healing. On appropriate  medications.   Essential (primary) hypertension blood pressure control important in reducing the progression of atherosclerotic disease. On appropriate oral medications.   Swelling of limb Currently wearing her compression stockings as previously recommended. This is providing reasonable control of her symptoms. We'll recommend continued use of compression stockings, elevation, and increased activity.  Kidney dialysis  as the cause of abnormal reaction of the patient, or of later complication, without mention of misadventure at the time of the procedure (CODE) The patient has end-stage renal disease and is on dialysis. He is currently having issues with her left arm AV fistula in terms of prolonged bleeding. I would recommend a fistulagram and possible intervention be performed at her convenience for further evaluation of this issue.  Risks and benefits are discussed.        Leotis Pain 04/18/2016, 1:09 PM   This note was created with Dragon medical transcription system.  Any errors from dictation are unintentional.

## 2016-04-18 NOTE — Assessment & Plan Note (Signed)
blood pressure control important in reducing the progression of atherosclerotic disease. On appropriate oral medications.  

## 2016-04-19 LAB — TYPE AND SCREEN
ABO/RH(D): O POS
Antibody Screen: NEGATIVE
Unit division: 0

## 2016-04-21 ENCOUNTER — Other Ambulatory Visit (INDEPENDENT_AMBULATORY_CARE_PROVIDER_SITE_OTHER): Payer: Self-pay | Admitting: Vascular Surgery

## 2016-04-23 ENCOUNTER — Encounter
Admission: RE | Disposition: A | Discharge status: Home / Self Care | Payer: Self-pay | Source: Ambulatory Visit | Attending: Vascular Surgery

## 2016-04-23 ENCOUNTER — Ambulatory Visit
Admission: RE | Admit: 2016-04-23 | Discharge: 2016-04-23 | Disposition: A | Discharge status: Home / Self Care | Payer: Medicare Other | Source: Ambulatory Visit | Attending: Vascular Surgery | Admitting: Vascular Surgery

## 2016-04-23 DIAGNOSIS — I120 Hypertensive chronic kidney disease with stage 5 chronic kidney disease or end stage renal disease: Secondary | ICD-10-CM | POA: Insufficient documentation

## 2016-04-23 DIAGNOSIS — E661 Drug-induced obesity: Secondary | ICD-10-CM | POA: Diagnosis not present

## 2016-04-23 DIAGNOSIS — E039 Hypothyroidism, unspecified: Secondary | ICD-10-CM | POA: Diagnosis not present

## 2016-04-23 DIAGNOSIS — Z794 Long term (current) use of insulin: Secondary | ICD-10-CM | POA: Diagnosis not present

## 2016-04-23 DIAGNOSIS — R569 Unspecified convulsions: Secondary | ICD-10-CM | POA: Diagnosis not present

## 2016-04-23 DIAGNOSIS — N186 End stage renal disease: Secondary | ICD-10-CM

## 2016-04-23 DIAGNOSIS — Z7982 Long term (current) use of aspirin: Secondary | ICD-10-CM | POA: Diagnosis not present

## 2016-04-23 DIAGNOSIS — T82838A Hemorrhage of vascular prosthetic devices, implants and grafts, initial encounter: Secondary | ICD-10-CM

## 2016-04-23 DIAGNOSIS — D649 Anemia, unspecified: Secondary | ICD-10-CM | POA: Insufficient documentation

## 2016-04-23 DIAGNOSIS — E042 Nontoxic multinodular goiter: Secondary | ICD-10-CM | POA: Insufficient documentation

## 2016-04-23 DIAGNOSIS — Z91018 Allergy to other foods: Secondary | ICD-10-CM | POA: Insufficient documentation

## 2016-04-23 DIAGNOSIS — E785 Hyperlipidemia, unspecified: Secondary | ICD-10-CM | POA: Diagnosis not present

## 2016-04-23 DIAGNOSIS — Z881 Allergy status to other antibiotic agents status: Secondary | ICD-10-CM | POA: Diagnosis not present

## 2016-04-23 DIAGNOSIS — Z8249 Family history of ischemic heart disease and other diseases of the circulatory system: Secondary | ICD-10-CM | POA: Insufficient documentation

## 2016-04-23 DIAGNOSIS — Z882 Allergy status to sulfonamides status: Secondary | ICD-10-CM | POA: Diagnosis not present

## 2016-04-23 DIAGNOSIS — T82898A Other specified complication of vascular prosthetic devices, implants and grafts, initial encounter: Secondary | ICD-10-CM | POA: Diagnosis present

## 2016-04-23 DIAGNOSIS — F418 Other specified anxiety disorders: Secondary | ICD-10-CM | POA: Insufficient documentation

## 2016-04-23 DIAGNOSIS — Z833 Family history of diabetes mellitus: Secondary | ICD-10-CM | POA: Diagnosis not present

## 2016-04-23 DIAGNOSIS — J449 Chronic obstructive pulmonary disease, unspecified: Secondary | ICD-10-CM | POA: Insufficient documentation

## 2016-04-23 DIAGNOSIS — K219 Gastro-esophageal reflux disease without esophagitis: Secondary | ICD-10-CM | POA: Insufficient documentation

## 2016-04-23 DIAGNOSIS — Y832 Surgical operation with anastomosis, bypass or graft as the cause of abnormal reaction of the patient, or of later complication, without mention of misadventure at the time of the procedure: Secondary | ICD-10-CM | POA: Insufficient documentation

## 2016-04-23 DIAGNOSIS — Z8042 Family history of malignant neoplasm of prostate: Secondary | ICD-10-CM | POA: Diagnosis not present

## 2016-04-23 DIAGNOSIS — G4730 Sleep apnea, unspecified: Secondary | ICD-10-CM | POA: Insufficient documentation

## 2016-04-23 DIAGNOSIS — E1122 Type 2 diabetes mellitus with diabetic chronic kidney disease: Secondary | ICD-10-CM | POA: Diagnosis not present

## 2016-04-23 DIAGNOSIS — Z888 Allergy status to other drugs, medicaments and biological substances status: Secondary | ICD-10-CM | POA: Insufficient documentation

## 2016-04-23 DIAGNOSIS — Z992 Dependence on renal dialysis: Secondary | ICD-10-CM | POA: Diagnosis not present

## 2016-04-23 HISTORY — PX: PERIPHERAL VASCULAR CATHETERIZATION: SHX172C

## 2016-04-23 LAB — POTASSIUM (ARMC VASCULAR LAB ONLY): Potassium (ARMC vascular lab): 3.9 (ref 3.5–5.1)

## 2016-04-23 SURGERY — A/V FISTULAGRAM
Anesthesia: Moderate Sedation | Laterality: Left

## 2016-04-23 MED ORDER — CEFUROXIME SODIUM 1.5 G IJ SOLR: 1.5000 g | INTRAMUSCULAR | Status: DC

## 2016-04-23 MED ORDER — ONDANSETRON HCL 4 MG/2ML IJ SOLN: 4.0000 mg | Freq: Four times a day (QID) | INTRAMUSCULAR | Status: DC | PRN

## 2016-04-23 MED ORDER — SODIUM CHLORIDE 0.9 % IV SOLN: 500.0000 mL | Freq: Once | INTRAVENOUS | Status: DC | PRN

## 2016-04-23 MED ORDER — PHENOL 1.4 % MT LIQD: 1.0000 | OROMUCOSAL | Status: DC | PRN

## 2016-04-23 MED ORDER — MIDAZOLAM HCL 5 MG/5ML IJ SOLN
INTRAMUSCULAR | Status: AC
  Filled 2016-04-23: qty 5

## 2016-04-23 MED ORDER — ACETAMINOPHEN 325 MG RE SUPP
325.0000 mg | RECTAL | Status: DC | PRN
  Filled 2016-04-23: qty 2

## 2016-04-23 MED ORDER — HYDROMORPHONE HCL 1 MG/ML IJ SOLN: 1.0000 mg | Freq: Once | INTRAMUSCULAR | Status: DC

## 2016-04-23 MED ORDER — FENTANYL CITRATE (PF) 100 MCG/2ML IJ SOLN
INTRAMUSCULAR | Status: AC
  Filled 2016-04-23: qty 2

## 2016-04-23 MED ORDER — MORPHINE SULFATE (PF) 4 MG/ML IV SOLN: 2.0000 mg | INTRAVENOUS | Status: DC | PRN

## 2016-04-23 MED ORDER — LIDOCAINE HCL (PF) 1 % IJ SOLN
INTRAMUSCULAR | Status: AC
  Filled 2016-04-23: qty 10

## 2016-04-23 MED ORDER — METHYLPREDNISOLONE SODIUM SUCC 125 MG IJ SOLR
125.0000 mg | INTRAMUSCULAR | Status: DC | PRN
  Administered 2016-04-23: 125 mg via INTRAVENOUS

## 2016-04-23 MED ORDER — ACETAMINOPHEN 325 MG PO TABS: 325.0000 mg | ORAL_TABLET | ORAL | Status: DC | PRN

## 2016-04-23 MED ORDER — CEFAZOLIN IN D5W 1 GM/50ML IV SOLN
1.0000 g | Freq: Once | INTRAVENOUS | Status: AC
  Administered 2016-04-23: 1 g via INTRAVENOUS

## 2016-04-23 MED ORDER — HEPARIN (PORCINE) IN NACL 2-0.9 UNIT/ML-% IJ SOLN
INTRAMUSCULAR | Status: AC
  Filled 2016-04-23: qty 1000

## 2016-04-23 MED ORDER — HEPARIN SODIUM (PORCINE) 1000 UNIT/ML IJ SOLN
INTRAMUSCULAR | Status: AC
  Filled 2016-04-23: qty 1

## 2016-04-23 MED ORDER — MIDAZOLAM HCL 2 MG/2ML IJ SOLN
INTRAMUSCULAR | Status: DC | PRN
  Administered 2016-04-23: 2 mg
  Administered 2016-04-23: 2 mg via INTRAVENOUS

## 2016-04-23 MED ORDER — FAMOTIDINE 20 MG PO TABS
ORAL_TABLET | ORAL | Status: AC
  Administered 2016-04-23: 40 mg via ORAL
  Filled 2016-04-23: qty 2

## 2016-04-23 MED ORDER — HYDRALAZINE HCL 20 MG/ML IJ SOLN: 5.0000 mg | INTRAMUSCULAR | Status: DC | PRN

## 2016-04-23 MED ORDER — ALUM & MAG HYDROXIDE-SIMETH 200-200-20 MG/5ML PO SUSP: 15.0000 mL | ORAL | Status: DC | PRN

## 2016-04-23 MED ORDER — SODIUM CHLORIDE 0.9 % IV SOLN
INTRAVENOUS | Status: DC
  Administered 2016-04-23: 08:00:00 via INTRAVENOUS

## 2016-04-23 MED ORDER — GUAIFENESIN-DM 100-10 MG/5ML PO SYRP: 15.0000 mL | ORAL_SOLUTION | ORAL | Status: DC | PRN

## 2016-04-23 MED ORDER — METHYLPREDNISOLONE SODIUM SUCC 125 MG IJ SOLR
INTRAMUSCULAR | Status: AC
  Administered 2016-04-23: 125 mg via INTRAVENOUS
  Filled 2016-04-23: qty 2

## 2016-04-23 MED ORDER — FENTANYL CITRATE (PF) 100 MCG/2ML IJ SOLN
INTRAMUSCULAR | Status: DC | PRN
  Administered 2016-04-23: 50 ug
  Administered 2016-04-23: 50 ug via INTRAVENOUS

## 2016-04-23 MED ORDER — FAMOTIDINE 20 MG PO TABS
40.0000 mg | ORAL_TABLET | ORAL | Status: DC | PRN
  Administered 2016-04-23: 40 mg via ORAL

## 2016-04-23 MED ORDER — METOPROLOL TARTRATE 5 MG/5ML IV SOLN: 2.0000 mg | INTRAVENOUS | Status: DC | PRN

## 2016-04-23 MED ORDER — LABETALOL HCL 5 MG/ML IV SOLN: 10.0000 mg | INTRAVENOUS | Status: DC | PRN

## 2016-04-23 MED ORDER — HEPARIN SODIUM (PORCINE) 5000 UNIT/ML IJ SOLN: 3000.0000 [IU] | Freq: Once | INTRAMUSCULAR | Status: DC

## 2016-04-23 MED ORDER — OXYCODONE-ACETAMINOPHEN 5-325 MG PO TABS: 1.0000 | ORAL_TABLET | ORAL | Status: DC | PRN

## 2016-04-23 SURGICAL SUPPLY — 5 items
CANNULA 5F STIFF (CANNULA) ×2 IMPLANT
DRAPE BRACHIAL (DRAPES) ×2 IMPLANT
PACK ANGIOGRAPHY (CUSTOM PROCEDURE TRAY) ×2 IMPLANT
SHEATH BRITE TIP 6FRX5.5 (SHEATH) ×2 IMPLANT
TOWEL OR 17X26 4PK STRL BLUE (TOWEL DISPOSABLE) ×2 IMPLANT

## 2016-04-23 NOTE — H&P (Signed)
[facility] History & Physical Update  The patient was interviewed and re-examined.  The patient's previous History and Physical has been reviewed and is unchanged.  There is no change in the plan of care. We plan to proceed with the scheduled procedure.  Leotis Pain, MD  04/23/2016, 8:11 AM

## 2016-04-23 NOTE — Op Note (Signed)
[facility]    OPERATIVE NOTE   PROCEDURE: 1.   Left radiocephalic arteriovenous fistula cannulation under ultrasound guidance 2.   Left arm fistulagram including central venogram   PRE-OPERATIVE DIAGNOSIS: 1. ESRD 2. Prolonged bleeding from left radiocephalic AV fistula  POST-OPERATIVE DIAGNOSIS: same as above   SURGEON: Leotis Pain, MD  ANESTHESIA: local with MCS  ESTIMATED BLOOD LOSS: Minimal  FINDING(S): 1. Mild narrowing near the arterial anastomosis of only about 30% which was not flow limiting. Otherwise patent AV fistula with dual outflow in the upper arm from both basilic vein and the cephalic vein area no central venous stenosis was present.  SPECIMEN(S):  None  CONTRAST: 20 cc  FLUORO TIME: 0.4 minutes  MODERATE CONSCIOUS SEDATION TIME: Approximately 30 minutes with 4 mg of Versed and 100 mcg of Fentanyl   INDICATIONS: Nina Johnston is a 55 y.o. female who presents with malfunctioning arteriovenous fistula with prolonged bleeding.  The patient is scheduled for left arm fistulagram.  The patient is aware the risks include but are not limited to: bleeding, infection, thrombosis of the cannulated access, and possible anaphylactic reaction to the contrast.  The patient is aware of the risks of the procedure and elects to proceed forward.  DESCRIPTION: After full informed written consent was obtained, the patient was brought back to the angiography suite and placed supine upon the angiography table.  The patient was connected to monitoring equipment. Moderate conscious sedation was administered with a face to face encounter with the patient throughout the procedure with my supervision of the RN administering medicines and monitoring the patient's vital signs and mental status throughout from the start of the procedure until the patient was taken to the recovery room. The left arm was prepped and draped in the standard fashion for a percutaneous  access intervention.  Under ultrasound guidance, the left radiocephalic arteriovenous fistula was cannulated with a micropuncture needle under direct ultrasound guidance and a permanent image was performed.  The microwire was advanced into the fistula and the needle was exchanged for the a microsheath.  I then upsized to a 6 Fr Sheath and imaging was performed.  Hand injections were completed to image the access including the central venous system. To evaluate the arterial anastomosis, compression of the fistula just below the antecubital fossa was performed. This demonstrated mild narrowing near the arterial anastomosis of only about 30% which was not flow limiting. Otherwise patent AV fistula with dual outflow in the upper arm from both basilic vein and the cephalic vein area no central venous stenosis was present.  Based on the images, this patient will need no intervention.  A 4-0 Monocryl purse-string suture was sewn around the sheath.  The sheath was removed while tying down the suture.  A sterile bandage was applied to the puncture site.  COMPLICATIONS: None  CONDITION: Stable   Leotis Pain  04/23/2016 9:13 AM   This note was created with Dragon Medical transcription system. Any errors in dictation are purely unintentional.

## 2016-04-24 ENCOUNTER — Encounter: Payer: Self-pay | Admitting: Vascular Surgery

## 2016-05-09 ENCOUNTER — Other Ambulatory Visit: Payer: Self-pay | Admitting: Neurology

## 2016-05-09 DIAGNOSIS — R404 Transient alteration of awareness: Secondary | ICD-10-CM

## 2016-05-09 DIAGNOSIS — R270 Ataxia, unspecified: Secondary | ICD-10-CM

## 2016-05-09 DIAGNOSIS — R4789 Other speech disturbances: Secondary | ICD-10-CM

## 2016-05-13 NOTE — Telephone Encounter (Signed)
RX for klonopin faxed to [facility]. Received a receipt of confirmation.

## 2016-06-09 ENCOUNTER — Ambulatory Visit: Payer: Medicare Other | Admitting: Urology

## 2016-06-09 ENCOUNTER — Encounter: Payer: Self-pay | Admitting: Urology

## 2016-06-09 NOTE — Progress Notes (Deleted)
06/09/2016 8:09 AM   Dwana Curd Kaylor 04-19-1961 [id number]  Referring provider: Idelle Crouch, MD [facility] [place], [place] [address]  No chief complaint on file.   HPI: Patient is a 56  year old Caucasian female who presents today for a yearly follow up for overactive bladder symptoms.    She has a history of urge incontinence.  She was going every hour was awake and has found relief with Vesicare 10 mg every other day.  She was evaluated by PT and completed 9 sessions.  She did see improvement in her urinary symptoms.  She is not experiencing gross hematuria, dysuria or suprapubic pain.  She is not experiencing fevers, chills, nausea or vomiting.    PMH: Past Medical History:  Diagnosis Date  . Acute bacterial sinusitis 04/19/2014  . Anxiety and depression   . Asthma   . 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 Benign essential HTN 12/26/2014  . Bipolar disorder ([place])   . Bronchitis   . Carotid artery narrowing 12/22/2014  . CKD (chronic kidney disease)   . Concussion 01-14-14   Fall   . COPD (chronic obstructive pulmonary disease) ([place])   . Diabetes mellitus without complication ([place])   . Diplopia   . Essential (primary) hypertension 02/12/2015  . Family history of thyroid problem   . GERD (gastroesophageal reflux disease)   . Heart disease    enlarged because of COPD  . Heart murmur   . HLD (hyperlipidemia)   . Hyperkalemia   . Hypertension   . Hyperthyroidism   . Morbid obesity ([place])   . Multiple thyroid nodules   . OAB (overactive bladder)   . Pernicious anemia   . Post-concussion syndrome 03/08/2014  . Renal disease    w/ GFR 29-may be due to diabetes  . Seizures ([place])   . Sleep apnea   . 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 Transient alteration of awareness 11/10/2012  . Urinary incontinence with continuous leakage 03/08/2014    Surgical  History: Past Surgical History:  Procedure Laterality Date  . AVF    . CHOLECYSTECTOMY    . ENDOMETRIAL BIOPSY     ablation, uterine  . HERNIA REPAIR    . PERIPHERAL VASCULAR CATHETERIZATION N/A 08/23/2015   Procedure: Dialysis/Perma Catheter Insertion;  Surgeon: Algernon Huxley, MD;  Location: [place] [facility];  Service: Cardiovascular;  Laterality: N/A;  . PERIPHERAL VASCULAR CATHETERIZATION N/A 11/09/2015   Procedure: Dialysis/Perma Catheter Removal;  Surgeon: Katha Cabal, MD;  Location: [place] [facility];  Service: Cardiovascular;  Laterality: N/A;  . PERIPHERAL VASCULAR CATHETERIZATION Left 04/23/2016   Procedure: A/V Fistulagram;  Surgeon: Algernon Huxley, MD;  Location: [place];  Service: Cardiovascular;  Laterality: Left;  . PORT A CATH REVISION      Home Medications:  Allergies as of 06/09/2016      Reactions   Cinnamon    Other reaction(s): Unknown   Procrit [epoetin Alfa]    Other reaction(s): Other (See Comments) Seizures, grand mal Seizures, grand mal   Ace Inhibitors Cough   Azithromycin    Other reaction(s): Other (See Comments) seizures   Iron    Can not take in IV Form - causes seizures    Neomycin-bacitracin Zn-polymyx    Other reaction(s): Unknown   Sulfa Antibiotics    Other reaction(s): Other (See Comments)  Due to risks for seizures.      Medication List       Accurate as of 06/09/16  8:09 AM. Always use your most recent med list.          ADVAIR DISKUS 250-50 MCG/DOSE Aepb Generic drug:  Fluticasone-Salmeterol Inhale 1 puff into the lungs 2 (two) times daily.   ALIGN 4 MG Caps Take 1 capsule by mouth daily.   ALLEGRA 180 MG tablet Generic drug:  fexofenadine Take 180 mg by mouth daily. am   amLODipine 10 MG tablet Commonly known as:  NORVASC Take 10 mg by mouth daily.   aspirin 81 MG tablet Take 81 mg by mouth daily.   b complex vitamins tablet Take 1 tablet by mouth daily.   BD PEN NEEDLE NANO U/F 32G X 4 MM  Misc Generic drug:  Insulin Pen Needle   carbamazepine 100 MG 12 hr tablet Commonly known as:  TEGRETOL-XR Two tablets in the morning and one tablet in the evening.   cefdinir 300 MG capsule Commonly known as:  OMNICEF Take 300 mg by mouth daily as needed. Reported on 06/06/2015   clonazePAM 1 MG tablet Commonly known as:  KLONOPIN TAKE ONE TABLET BY MOUTH AT BEDTIME   cyanocobalamin 1000 MCG/ML injection Commonly known as:  (VITAMIN B-12) Inject 1,000 mcg into the muscle every 14 (fourteen) days.   DELSYM 30 MG/5ML liquid Generic drug:  dextromethorphan Take 60 mg by mouth as needed for cough.   ethosuximide 250 MG/5ML solution Commonly known as:  ZARONTIN TAKE 5ML BY MOUTH 2 TIMES DAILY   fluconazole 150 MG tablet Commonly known as:  DIFLUCAN Take 1 tablet (150 mg total) by mouth daily.   folic acid-vitamin b complex-vitamin c-selenium-zinc 3 MG Tabs tablet Take 1 tablet by mouth daily.   furosemide 20 MG tablet Commonly known as:  LASIX Take 30 mg by mouth 2 (two) times daily.   glipiZIDE 5 MG tablet Commonly known as:  GLUCOTROL Take 5-10 mg by mouth See admin instructions. 10 mg in the morning, and 5 mg at dinner   hydrALAZINE 50 MG tablet Commonly known as:  APRESOLINE Take 50 mg by mouth 2 (two) times daily.   lamoTRIgine 200 MG tablet Commonly known as:  LAMICTAL Take 1.5 tablets (300 mg total) by mouth 2 (two) times daily. Brand Medically Necessary   LANTUS 100 UNIT/ML injection Generic drug:  insulin glargine Inject 10 Units into the skin at bedtime.   LOPID 600 MG tablet Generic drug:  gemfibrozil Take 600 mg by mouth 2 (two) times daily before a meal.   metoprolol succinate 50 MG 24 hr tablet Commonly known as:  TOPROL XL Take 1 tablet (50 mg total) by mouth 2 (two) times daily. Take with or immediately following a meal.   MUCINEX PO Take 1 tablet by mouth 2 (two) times daily as needed. Expectorant, as needed   NASACORT ALLERGY 24HR 55  MCG/ACT Aero nasal inhaler Generic drug:  triamcinolone Place 2 sprays into the nose at bedtime.   pantoprazole 40 MG tablet Commonly known as:  PROTONIX Take 40 mg by mouth daily.   predniSONE 10 MG tablet Commonly known as:  DELTASONE Take 10 mg by mouth daily as needed. Reported on 06/06/2015   sevelamer carbonate 800 MG tablet Commonly known as:  RENVELA Take 800-3,200 mg by mouth See admin instructions. 4  Tablets 3 times daily with meals, and 1 tablet with snacks   SINGULAIR 10 MG tablet Generic drug:  montelukast Take 10 mg by mouth at bedtime.   STOOL SOFTENER PO Take 1 capsule by mouth every morning.   SYNTHROID 150 MCG tablet Generic drug:  levothyroxine Take 150 mcg by mouth daily. Am dosage   TUSSIONEX PENNKINETIC ER 10-8 MG/5ML Suer Generic drug:  chlorpheniramine-HYDROcodone Take 5 mLs by mouth every 12 (twelve) hours as needed for cough.   ULORIC 80 MG Tabs Generic drug:  Febuxostat Take 1 tablet by mouth daily. Morning dosage       Allergies:  Allergies  Allergen Reactions  . Cinnamon     Other reaction(s): Unknown  . Procrit [Epoetin Alfa]     Other reaction(s): Other (See Comments) Seizures, grand mal Seizures, grand mal  . Ace Inhibitors Cough  . Azithromycin     Other reaction(s): Other (See Comments) seizures  . Iron     Can not take in IV Form - causes seizures   . Neomycin-Bacitracin Zn-Polymyx     Other reaction(s): Unknown  . Sulfa Antibiotics     Other reaction(s): Other (See Comments) Due to risks for seizures.    Family History: Family History  Problem Relation Age of Onset  . Diabetes Mother   . Lumbar disc disease Mother   . Migraines Mother   . Hypertension Mother   . Cancer - Prostate Father     mild  . Diabetes Father   . Hypertension Father   . Breast cancer Neg Hx   . Ovarian cancer Neg Hx   . Colon cancer Neg Hx   . Heart disease Neg Hx     Social History:  reports that she has never smoked. She has never  used smokeless tobacco. She reports that she does not drink alcohol or use drugs.  ROS:                                        Physical Exam: There were no vitals taken for this visit.  Constitutional: Well nourished. Alert and oriented, No acute distress. HEENT: [place] AT, moist mucus membranes. Trachea midline, no masses. Cardiovascular: No clubbing, cyanosis, or edema. Respiratory: Normal respiratory effort, no increased work of breathing. GI: Abdomen is soft, non tender, non distended, no abdominal masses. Liver and spleen not palpable.  No hernias appreciated.  Stool sample for occult testing is not indicated.   GU: No CVA tenderness.  No bladder fullness or masses.  (Patient has her pelvic exams with Dr. Joline Salt every year)   Skin: No rashes, bruises or suspicious lesions. Lymph: No cervical or inguinal adenopathy. Neurologic: Grossly intact, no focal deficits, moving all 4 extremities. Psychiatric: Normal mood and affect.  Laboratory Data: Lab Results  Component Value Date   WBC 8.7 04/10/2016   HGB 8.1 (L) 04/10/2016   HCT 23.1 (L) 04/10/2016   MCV 101.6 (H) 04/10/2016   PLT 211 04/10/2016    Lab Results  Component Value Date   CREATININE 1.98 (H) 04/10/2016    Lab Results  Component Value Date   AST 14 (L) 12/20/2014   Lab Results  Component Value Date   ALT 6 (L) 12/20/2014     Pertinent Imaging: ***   Assessment & Plan:    1. OAB (overactive bladder):  Controlled with Vesicare 10 mg, every other day.    - Bladder Scan (Post Void Residual) in office  2. Incontinence:   Patient's urge incontinence is controlled with  Vesicare.  She is taking Vesicare 10 mg every other day due to her CKD.    - solifenacin (VESICARE) 10 MG tablet; Take 1 tablet (10 mg total) by mouth daily.  Dispense: 90 tablet; Refill: 3   No Follow-up on file.  These notes generated with voice recognition software. I apologize for typographical  errors.  Zara Council, [facility] [address], [place], [place] [address] [phone]

## 2016-07-05 NOTE — Progress Notes (Signed)
07/07/2016 12:00 PM   [place] [birth year] [id number]  Referring provider: Idelle Crouch, MD [facility] [facility] [place], [place] [address]  Chief Complaint  Patient presents with  . Over Active Bladder     year follow up     HPI: 56 yo WF with ESRD who presents today for a yearly follow up for overactive bladder symptoms.    She has a history of urge incontinence.  She was going every hour was awake and has found relief with Vesicare 10 mg every other day.  She was evaluated by PT and completed 9 sessions.  She did see improvement in her urinary symptoms.  She is not experiencing gross hematuria, dysuria or suprapubic pain.  She is not experiencing fevers, chills, nausea or vomiting.   Today, she has urgency x 7, frequency x 7, engaging in toilet mapping, incontinence x 3 and nocturia x 3.   She denies dysuria, gross hematuria and suprapubic pain.  She denies fevers, chills, nausea and vomiting.  Her PVR is 32 mL.     PMH: Past Medical History:  Diagnosis Date  . Acute bacterial sinusitis 04/19/2014  . Anxiety and depression   . Asthma   . 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 Benign essential HTN 12/26/2014  . Bipolar disorder ([place])   . Bronchitis   . Carotid artery narrowing 12/22/2014  . CKD (chronic kidney disease)   . Concussion 01-14-14   Fall   . COPD (chronic obstructive pulmonary disease) ([place])   . Diabetes mellitus without complication ([place])   . Dialysis patient ([place])   . Diplopia   . Essential (primary) hypertension 02/12/2015  . Family history of thyroid problem   . GERD (gastroesophageal reflux disease)   . Heart disease    enlarged because of COPD  . Heart murmur   . HLD (hyperlipidemia)   . Hyperkalemia   . Hypertension   . Hyperthyroidism   . Morbid obesity ([place])   . Multiple thyroid nodules   . OAB (overactive bladder)   . Pernicious anemia     . Post-concussion syndrome 03/08/2014  . Renal disease    w/ GFR 29-may be due to diabetes  . Seizures ([place])   . Sleep apnea   . 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 Transient alteration of awareness 11/10/2012  . Urinary incontinence with continuous leakage 03/08/2014    Surgical History: Past Surgical History:  Procedure Laterality Date  . AVF    . CHOLECYSTECTOMY    . ENDOMETRIAL BIOPSY     ablation, uterine  . HERNIA REPAIR    . PERIPHERAL VASCULAR CATHETERIZATION N/A 08/23/2015   Procedure: Dialysis/Perma Catheter Insertion;  Surgeon: Algernon Huxley, MD;  Location: [facility];  Service: Cardiovascular;  Laterality: N/A;  . PERIPHERAL VASCULAR CATHETERIZATION N/A 11/09/2015   Procedure: Dialysis/Perma Catheter Removal;  Surgeon: Katha Cabal, MD;  Location: [facility];  Service: Cardiovascular;  Laterality: N/A;  . PERIPHERAL VASCULAR CATHETERIZATION Left 04/23/2016   Procedure: A/V Fistulagram;  Surgeon: Algernon Huxley, MD;  Location: [facility];  Service: Cardiovascular;  Laterality: Left;  . PORT A CATH REVISION      Home Medications:  Allergies as of 07/07/2016      Reactions   Cinnamon    Other reaction(s): Unknown   Procrit [epoetin Alfa]    Other  reaction(s): Other (See Comments) Seizures, grand mal Seizures, grand mal   Ace Inhibitors Cough   Azithromycin    Other reaction(s): Other (See Comments) seizures   Iron    Can not take in IV Form - causes seizures    Neomycin-bacitracin Zn-polymyx    Other reaction(s): Unknown   Sulfa Antibiotics    Other reaction(s): Other (See Comments) Due to risks for seizures.      Medication List       Accurate as of 07/07/16 12:00 PM. Always use your most recent med list.          ADVAIR DISKUS 250-50 MCG/DOSE Aepb Generic drug:  Fluticasone-Salmeterol Inhale 1 puff into the lungs 2 (two) times daily.   ALIGN 4 MG Caps Take 1 capsule by mouth daily.   ALIGN JR FOR KIDS PO Take by mouth.    ALLEGRA 180 MG tablet Generic drug:  fexofenadine Take 180 mg by mouth daily. am   amLODipine 10 MG tablet Commonly known as:  NORVASC Take 10 mg by mouth daily.   aspirin 81 MG tablet Take 81 mg by mouth daily.   b complex vitamins tablet Take 1 tablet by mouth daily.   BD PEN NEEDLE NANO U/F 32G X 4 MM Misc Generic drug:  Insulin Pen Needle   carbamazepine 100 MG 12 hr tablet Commonly known as:  TEGRETOL-XR Two tablets in the morning and one tablet in the evening.   cefdinir 300 MG capsule Commonly known as:  OMNICEF Take 300 mg by mouth daily as needed. Reported on 06/06/2015   clonazePAM 1 MG tablet Commonly known as:  KLONOPIN TAKE ONE TABLET BY MOUTH AT BEDTIME   VITAMIN B 12 PO Take by mouth.   cyanocobalamin 1000 MCG/ML injection Commonly known as:  (VITAMIN B-12) Inject 1,000 mcg into the muscle every 14 (fourteen) days.   DELSYM 30 MG/5ML liquid Generic drug:  dextromethorphan Take 60 mg by mouth as needed for cough.   ethosuximide 250 MG/5ML solution Commonly known as:  ZARONTIN TAKE 5ML BY MOUTH 2 TIMES DAILY   fluconazole 150 MG tablet Commonly known as:  DIFLUCAN Take 1 tablet (150 mg total) by mouth daily.   folic acid-vitamin b complex-vitamin c-selenium-zinc 3 MG Tabs tablet Take 1 tablet by mouth daily.   furosemide 20 MG tablet Commonly known as:  LASIX Take 30 mg by mouth 2 (two) times daily.   glipiZIDE 5 MG tablet Commonly known as:  GLUCOTROL Take 5-10 mg by mouth See admin instructions. 10 mg in the morning, and 5 mg at dinner   hydrALAZINE 50 MG tablet Commonly known as:  APRESOLINE Take 100 mg by mouth 2 (two) times daily.   insulin lispro 100 UNIT/ML injection Commonly known as:  HUMALOG Inject into the skin 3 (three) times daily before meals. Sliding scale takes in sugars are over 200-299 2 units, 300-399 3 units, over 400 4 units.   lamoTRIgine 200 MG tablet Commonly known as:  LAMICTAL Take 1.5 tablets (300 mg total)  by mouth 2 (two) times daily. Brand Medically Necessary   LANTUS 100 UNIT/ML injection Generic drug:  insulin glargine Inject 10 Units into the skin at bedtime.   LOPID 600 MG tablet Generic drug:  gemfibrozil Take 600 mg by mouth 2 (two) times daily before a meal.   metoprolol succinate 50 MG 24 hr tablet Commonly known as:  TOPROL XL Take 1 tablet (50 mg total) by mouth 2 (two) times daily. Take with or immediately following  a meal.   MUCINEX PO Take 1 tablet by mouth 2 (two) times daily as needed. Expectorant, as needed   mupirocin ointment 2 % Commonly known as:  BACTROBAN Apply topically.   NASACORT ALLERGY 24HR 55 MCG/ACT Aero nasal inhaler Generic drug:  triamcinolone Place 2 sprays into the nose at bedtime.   ONE TOUCH ULTRA TEST test strip Generic drug:  glucose blood Use 4 (four) times daily. Test 3 - 4 times daily E11.21   pantoprazole 40 MG tablet Commonly known as:  PROTONIX Take 40 mg by mouth daily.   predniSONE 10 MG tablet Commonly known as:  DELTASONE Take 10 mg by mouth daily as needed. Reported on 06/06/2015   sevelamer carbonate 800 MG tablet Commonly known as:  RENVELA Take 800-3,200 mg by mouth See admin instructions. 4  Tablets 3 times daily with meals, and 1 tablet with snacks   SINGULAIR 10 MG tablet Generic drug:  montelukast Take 10 mg by mouth at bedtime.   solifenacin 5 MG tablet Commonly known as:  VESICARE Take 1 tablet (5 mg total) by mouth daily.   STOOL SOFTENER PO Take 1 capsule by mouth every morning.   SYNTHROID 150 MCG tablet Generic drug:  levothyroxine Take 150 mcg by mouth daily. Am dosage   TUSSIONEX PENNKINETIC ER 10-8 MG/5ML Suer Generic drug:  chlorpheniramine-HYDROcodone Take 5 mLs by mouth every 12 (twelve) hours as needed for cough.   ULORIC 80 MG Tabs Generic drug:  Febuxostat Take 1 tablet by mouth daily. Morning dosage       Allergies:  Allergies  Allergen Reactions  . Cinnamon     Other  reaction(s): Unknown  . Procrit [Epoetin Alfa]     Other reaction(s): Other (See Comments) Seizures, grand mal Seizures, grand mal  . Ace Inhibitors Cough  . Azithromycin     Other reaction(s): Other (See Comments) seizures  . Iron     Can not take in IV Form - causes seizures   . Neomycin-Bacitracin Zn-Polymyx     Other reaction(s): Unknown  . Sulfa Antibiotics     Other reaction(s): Other (See Comments) Due to risks for seizures.    Family History: Family History  Problem Relation Age of Onset  . Diabetes Mother   . Lumbar disc disease Mother   . Migraines Mother   . Hypertension Mother   . Cancer - Prostate Father     mild  . Diabetes Father   . Hypertension Father   . Breast cancer Neg Hx   . Ovarian cancer Neg Hx   . Colon cancer Neg Hx   . Heart disease Neg Hx   . Kidney cancer Neg Hx   . Bladder Cancer Neg Hx     Social History:  reports that she has never smoked. She has never used smokeless tobacco. She reports that she does not drink alcohol or use drugs.  ROS: UROLOGY Frequent Urination?: Yes Hard to postpone urination?: Yes Burning/pain with urination?: Yes Get up at night to urinate?: Yes Leakage of urine?: Yes Urine stream starts and stops?: Yes Trouble starting stream?: No Do you have to strain to urinate?: No Blood in urine?: No Urinary tract infection?: No Sexually transmitted disease?: No Injury to kidneys or bladder?: Yes Painful intercourse?: No Weak stream?: No Currently pregnant?: No Vaginal bleeding?: No Last menstrual period?: n  Gastrointestinal Nausea?: No Vomiting?: No Indigestion/heartburn?: No Diarrhea?: Yes Constipation?: Yes  Constitutional Fever: No Night sweats?: No Weight loss?: Yes Fatigue?: Yes  Skin  Skin rash/lesions?: No Itching?: No  Eyes Blurred vision?: No Double vision?: No  Ears/Nose/Throat Sore throat?: No Sinus problems?: No  Hematologic/Lymphatic Swollen glands?: No Easy bruising?:  No  Cardiovascular Leg swelling?: Yes Chest pain?: No  Respiratory Cough?: No Shortness of breath?: Yes  Endocrine Excessive thirst?: Yes  Musculoskeletal Back pain?: No Joint pain?: No  Neurological Headaches?: Yes Dizziness?: No  Psychologic Depression?: No Anxiety?: No  Physical Exam: BP (!) 154/68   Pulse (!) 56   Ht 5' 2.5" (1.588 m)   Wt 198 lb 4.8 oz (89.9 kg)   BMI 35.69 kg/m   Constitutional: Well nourished. Alert and oriented, No acute distress. HEENT: [place] AT, moist mucus membranes. Trachea midline, no masses. Cardiovascular: No clubbing, cyanosis, or edema. Respiratory: Normal respiratory effort, no increased work of breathing. GI: Abdomen is soft, non tender, non distended, no abdominal masses. Liver and spleen not palpable.  No hernias appreciated.  Stool sample for occult testing is not indicated.   GU: No CVA tenderness.  No bladder fullness or masses.  (Patient has her pelvic exams with Dr. Joline Salt every year)   Skin: No rashes, bruises or suspicious lesions. Lymph: No cervical or inguinal adenopathy. Neurologic: Grossly intact, no focal deficits, moving all 4 extremities. Psychiatric: Normal mood and affect.  Laboratory Data: Lab Results  Component Value Date   WBC 8.7 04/10/2016   HGB 8.1 (L) 04/10/2016   HCT 23.1 (L) 04/10/2016   MCV 101.6 (H) 04/10/2016   PLT 211 04/10/2016    Lab Results  Component Value Date   CREATININE 1.98 (H) 04/10/2016    Lab Results  Component Value Date   AST 14 (L) 12/20/2014   Lab Results  Component Value Date   ALT 6 (L) 12/20/2014     Pertinent Imaging:    Assessment & Plan:    1. OAB (overactive bladder):  Restart Vesicare 5 mg daily  - Bladder Scan (Post Void Residual) in office  2. Incontinence:   Restart Vesicare  5 mg daily  Return in about 1 year (around 07/07/2017) for PVR and OAB questionnaire.  These notes generated with voice recognition software. I apologize for  typographical errors.  Zara Council, [facility] [address] [place], [place] [address] [phone]

## 2016-07-07 ENCOUNTER — Encounter: Payer: Self-pay | Admitting: Urology

## 2016-07-07 ENCOUNTER — Ambulatory Visit (INDEPENDENT_AMBULATORY_CARE_PROVIDER_SITE_OTHER): Payer: Medicare Other | Admitting: Urology

## 2016-07-07 VITALS — BP 154/68 | HR 56 | Ht 62.5 in | Wt 198.3 lb

## 2016-07-07 DIAGNOSIS — N3281 Overactive bladder: Secondary | ICD-10-CM

## 2016-07-07 DIAGNOSIS — N3941 Urge incontinence: Secondary | ICD-10-CM | POA: Diagnosis not present

## 2016-07-07 LAB — BLADDER SCAN AMB NON-IMAGING: Scan Result: 32

## 2016-07-07 MED ORDER — SOLIFENACIN SUCCINATE 5 MG PO TABS: 5.0000 mg | ORAL_TABLET | Freq: Every day | ORAL | 11 refills | Status: DC

## 2016-07-08 ENCOUNTER — Telehealth: Payer: Self-pay | Admitting: Neurology

## 2016-07-08 NOTE — Telephone Encounter (Signed)
Patients mother called office in reference to carbamazepine (TEGRETOL-XR) 100 MG 12 hr tablet stating [employer] is requesting information stating patient needs to be on name brand not generic.  Pharmacy- [facility].

## 2016-07-09 NOTE — Telephone Encounter (Signed)
I attempted to do the tegretol PA several times via covermymeds, like was done last year, however, it kept telling me that pt's eligibility could not be verified. I called [employer]. They informed me that pt's pharmacy benefits are not active and that pt needs to call [phone] which is member services, and then ask for a "live update for pharmacy benefits".  I called and spoke to pt's mother, and advised her of this information. She will call me back and let me know when this has been taken care of so I can proceed with the pa.

## 2016-07-10 ENCOUNTER — Telehealth: Payer: Self-pay | Admitting: *Deleted

## 2016-07-10 NOTE — Telephone Encounter (Signed)
Pt's mother called back with the insurance phone number and ID. I completed pa for brand name tegretol, send to [employer]. Should have a determination in 1-3 business days.

## 2016-07-11 ENCOUNTER — Encounter (INDEPENDENT_AMBULATORY_CARE_PROVIDER_SITE_OTHER): Payer: Self-pay

## 2016-07-15 NOTE — Telephone Encounter (Signed)
[employer] approved tegretol effective 07/10/2016 to 07/11/2017.  I sent a notice to [facility] advising them of this information.

## 2016-07-17 ENCOUNTER — Other Ambulatory Visit (INDEPENDENT_AMBULATORY_CARE_PROVIDER_SITE_OTHER): Payer: Self-pay | Admitting: Vascular Surgery

## 2016-07-20 MED ORDER — METHYLPREDNISOLONE SODIUM SUCC 125 MG IJ SOLR: 125.0000 mg | INTRAMUSCULAR | Status: DC | PRN

## 2016-07-20 MED ORDER — CEFAZOLIN IN D5W 1 GM/50ML IV SOLN
1.0000 g | Freq: Once | INTRAVENOUS | Status: AC
  Administered 2016-07-21: 1 g via INTRAVENOUS

## 2016-07-20 MED ORDER — FAMOTIDINE 20 MG PO TABS: 40.0000 mg | ORAL_TABLET | ORAL | Status: DC | PRN

## 2016-07-21 ENCOUNTER — Encounter
Admission: RE | Disposition: A | Discharge status: Home / Self Care | Payer: Self-pay | Source: Ambulatory Visit | Attending: Vascular Surgery

## 2016-07-21 ENCOUNTER — Encounter: Payer: Self-pay | Admitting: *Deleted

## 2016-07-21 ENCOUNTER — Ambulatory Visit
Admission: RE | Admit: 2016-07-21 | Discharge: 2016-07-21 | Disposition: A | Discharge status: Home / Self Care | Payer: Medicare Other | Source: Ambulatory Visit | Attending: Vascular Surgery | Admitting: Vascular Surgery

## 2016-07-21 DIAGNOSIS — I120 Hypertensive chronic kidney disease with stage 5 chronic kidney disease or end stage renal disease: Secondary | ICD-10-CM

## 2016-07-21 DIAGNOSIS — E1122 Type 2 diabetes mellitus with diabetic chronic kidney disease: Secondary | ICD-10-CM

## 2016-07-21 DIAGNOSIS — T82868A Thrombosis of vascular prosthetic devices, implants and grafts, initial encounter: Secondary | ICD-10-CM | POA: Diagnosis present

## 2016-07-21 DIAGNOSIS — N186 End stage renal disease: Secondary | ICD-10-CM | POA: Insufficient documentation

## 2016-07-21 DIAGNOSIS — Y832 Surgical operation with anastomosis, bypass or graft as the cause of abnormal reaction of the patient, or of later complication, without mention of misadventure at the time of the procedure: Secondary | ICD-10-CM | POA: Diagnosis not present

## 2016-07-21 HISTORY — PX: A/V SHUNT INTERVENTION: CATH118220

## 2016-07-21 HISTORY — PX: A/V FISTULAGRAM: CATH118298

## 2016-07-21 LAB — POTASSIUM (ARMC VASCULAR LAB ONLY): Potassium (ARMC vascular lab): 4.2 (ref 3.5–5.1)

## 2016-07-21 SURGERY — A/V FISTULAGRAM
Anesthesia: Moderate Sedation

## 2016-07-21 MED ORDER — FENTANYL CITRATE (PF) 100 MCG/2ML IJ SOLN
INTRAMUSCULAR | Status: DC | PRN
  Administered 2016-07-21: 50 ug via INTRAVENOUS
  Administered 2016-07-21: 25 ug via INTRAVENOUS

## 2016-07-21 MED ORDER — CEFAZOLIN IN D5W 1 GM/50ML IV SOLN
INTRAVENOUS | Status: AC
  Filled 2016-07-21: qty 50

## 2016-07-21 MED ORDER — LABETALOL HCL 5 MG/ML IV SOLN: 10.0000 mg | INTRAVENOUS | Status: DC | PRN

## 2016-07-21 MED ORDER — METOPROLOL TARTRATE 5 MG/5ML IV SOLN: 2.0000 mg | INTRAVENOUS | Status: DC | PRN

## 2016-07-21 MED ORDER — MIDAZOLAM HCL 5 MG/5ML IJ SOLN
INTRAMUSCULAR | Status: AC
  Filled 2016-07-21: qty 5

## 2016-07-21 MED ORDER — HEPARIN (PORCINE) IN NACL 2-0.9 UNIT/ML-% IJ SOLN
INTRAMUSCULAR | Status: AC
  Filled 2016-07-21: qty 1000

## 2016-07-21 MED ORDER — ONDANSETRON HCL 4 MG/2ML IJ SOLN: 4.0000 mg | Freq: Four times a day (QID) | INTRAMUSCULAR | Status: DC | PRN

## 2016-07-21 MED ORDER — SODIUM CHLORIDE 0.9 % IV SOLN: INTRAVENOUS | Status: DC

## 2016-07-21 MED ORDER — PHENOL 1.4 % MT LIQD
1.0000 | OROMUCOSAL | Status: DC | PRN
  Filled 2016-07-21: qty 177

## 2016-07-21 MED ORDER — MORPHINE SULFATE (PF) 4 MG/ML IV SOLN: 2.0000 mg | INTRAVENOUS | Status: DC | PRN

## 2016-07-21 MED ORDER — ALUM & MAG HYDROXIDE-SIMETH 200-200-20 MG/5ML PO SUSP: 15.0000 mL | ORAL | Status: DC | PRN

## 2016-07-21 MED ORDER — ACETAMINOPHEN 325 MG RE SUPP
325.0000 mg | RECTAL | Status: DC | PRN
  Filled 2016-07-21: qty 2

## 2016-07-21 MED ORDER — OXYCODONE-ACETAMINOPHEN 5-325 MG PO TABS: 1.0000 | ORAL_TABLET | ORAL | Status: DC | PRN

## 2016-07-21 MED ORDER — LIDOCAINE-EPINEPHRINE (PF) 2 %-1:200000 IJ SOLN
INTRAMUSCULAR | Status: AC
  Filled 2016-07-21: qty 20

## 2016-07-21 MED ORDER — IOPAMIDOL (ISOVUE-300) INJECTION 61%
INTRAVENOUS | Status: DC | PRN
  Administered 2016-07-21: 20 mL via INTRAVENOUS

## 2016-07-21 MED ORDER — MIDAZOLAM HCL 2 MG/2ML IJ SOLN
INTRAMUSCULAR | Status: DC | PRN
  Administered 2016-07-21: 1 mg via INTRAVENOUS
  Administered 2016-07-21: 2 mg via INTRAVENOUS

## 2016-07-21 MED ORDER — FENTANYL CITRATE (PF) 100 MCG/2ML IJ SOLN
INTRAMUSCULAR | Status: AC
  Filled 2016-07-21: qty 2

## 2016-07-21 MED ORDER — HEPARIN SODIUM (PORCINE) 1000 UNIT/ML IJ SOLN
INTRAMUSCULAR | Status: AC
  Filled 2016-07-21: qty 1

## 2016-07-21 MED ORDER — HYDRALAZINE HCL 20 MG/ML IJ SOLN: 5.0000 mg | INTRAMUSCULAR | Status: DC | PRN

## 2016-07-21 MED ORDER — HYDROMORPHONE HCL 1 MG/ML IJ SOLN: 1.0000 mg | Freq: Once | INTRAMUSCULAR | Status: DC

## 2016-07-21 MED ORDER — ACETAMINOPHEN 325 MG PO TABS: 325.0000 mg | ORAL_TABLET | ORAL | Status: DC | PRN

## 2016-07-21 MED ORDER — GUAIFENESIN-DM 100-10 MG/5ML PO SYRP
15.0000 mL | ORAL_SOLUTION | ORAL | Status: DC | PRN
  Filled 2016-07-21: qty 15

## 2016-07-21 SURGICAL SUPPLY — 6 items
CANNULA 5F STIFF (CANNULA) ×3 IMPLANT
DRAPE BRACHIAL (DRAPES) ×3 IMPLANT
PACK ANGIOGRAPHY (CUSTOM PROCEDURE TRAY) ×3 IMPLANT
SHEATH BRITE TIP 6FRX5.5 (SHEATH) ×3 IMPLANT
SUT MNCRL AB 4-0 PS2 18 (SUTURE) ×3 IMPLANT
TOWEL OR 17X26 4PK STRL BLUE (TOWEL DISPOSABLE) ×3 IMPLANT

## 2016-07-21 NOTE — Op Note (Signed)
[facility]    OPERATIVE NOTE   PROCEDURE: 1.   Left radiocephalic arteriovenous fistula cannulation under ultrasound guidance 2.   left arm fistulagram including central venogram  PRE-OPERATIVE DIAGNOSIS: 1. ESRD 2. Poorly functional left radiocephalic AVF with low transonic flow  POST-OPERATIVE DIAGNOSIS: same as above   SURGEON: Leotis Pain, MD  ANESTHESIA: local with MCS  ESTIMATED BLOOD LOSS: minimal  FINDING(S): 1. Mild stenosis in the perianastomotic region of only about 30%. Widely patent left radiocephalic AV fistula otherwise with dual outflow in the upper arm and a normal central venous circulation.  SPECIMEN(S):  None  CONTRAST: 20 cc  FLUORO TIME: 0.4 minutes  MODERATE CONSCIOUS SEDATION TIME: Approximately 15 minutes with 3 mg of Versed and 75 mcg of Fentanyl   INDICATIONS: Nina Johnston is a 56 y.o. female who presents with malfunctioning left radiocephalic arteriovenous fistula with poor transonic flows.  The patient is scheduled for left arm fistulagram.  The patient is aware the risks include but are not limited to: bleeding, infection, thrombosis of the cannulated access, and possible anaphylactic reaction to the contrast.  The patient is aware of the risks of the procedure and elects to proceed forward.  DESCRIPTION: After full informed written consent was obtained, the patient was brought back to the angiography suite and placed supine upon the angiography table.  The patient was connected to monitoring equipment. Moderate conscious sedation was administered with a face to face encounter with the patient throughout the procedure with my supervision of the RN administering medicines and monitoring the patient's vital signs and mental status throughout from the start of the procedure until the patient was taken to the recovery room. The left arm was prepped and draped in the standard fashion for a percutaneous access intervention.  Under  ultrasound guidance, the left radiocephalic arteriovenous fistula was cannulated with a micropuncture needle under direct ultrasound guidance and a permanent image was performed.  The microwire was advanced into the fistula and the needle was exchanged for the a microsheath.  I then upsized to a 6 Fr Sheath and imaging was performed.  Hand injections were completed to image the access including the central venous system. This demonstrated mild stenosis in the perianastomotic region of only about 30%. Widely patent left radiocephalic AV fistula otherwise with dual outflow in the upper arm and a normal central venous circulation..  Based on the images, this patient will need no intervention. A 4-0 Monocryl purse-string suture was sewn around the sheath.  The sheath was removed while tying down the suture.  A sterile bandage was applied to the puncture site.  COMPLICATIONS: None  CONDITION: Stable   Leotis Pain  07/21/2016 8:43 AM   This note was created with Dragon Medical transcription system. Any errors in dictation are purely unintentional.

## 2016-07-21 NOTE — H&P (Signed)
[facility] Admission History & Physical  MRN : [id number]  Nina Johnston is a 56 y.o. (10/27/1960) female who presents with chief complaint of No chief complaint on file. Marland Kitchen  History of Present Illness: I am asked to evaluate the patient by the dialysis center. The patient was sent here because they were unable to achieve adequate dialysis this morning. Furthermore the Center states there is very poor thrill and bruit. The patient states there there have been increasing problems with the access, such as low flow during dialysis and prolonged bleeding after decannulation. The patient estimates these problems have been going on for several weeks. The patient is unaware of any other change.  Patient denies pain or tenderness overlying the access.  There is no pain with dialysis.  The patient denies hand pain or finger pain consistent with steal syndrome.   There have been past interventions or declots of this access.  The patient is not chronically hypotensive on dialysis.  Current Facility-Administered Medications  Medication Dose Route Frequency Provider Last Rate Last Dose  . 0.9 %  sodium chloride infusion   Intravenous Continuous Kimberly A Stegmayer, PA-C      . ceFAZolin (ANCEF) IVPB 1 g/50 mL premix  1 g Intravenous Once [employer], PA-C      . famotidine (PEPCID) tablet 40 mg  40 mg Oral PRN Janalyn Harder Stegmayer, PA-C      . HYDROmorphone (DILAUDID) injection 1 mg  1 mg Intravenous Once [employer], PA-C      . methylPREDNISolone sodium succinate (SOLU-MEDROL) 125 mg/2 mL injection 125 mg  125 mg Intravenous PRN Kimberly A Stegmayer, PA-C      . ondansetron (ZOFRAN) injection 4 mg  4 mg Intravenous Q6H PRN Sela Hua, PA-C        Past Medical History:  Diagnosis Date  . Acute bacterial sinusitis 04/19/2014  . Anxiety and depression   . Asthma   . 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 Benign essential HTN 12/26/2014  . Bipolar disorder ([place])   . Bronchitis   . Carotid artery narrowing 12/22/2014  . CKD (chronic kidney disease)   . Concussion 01-14-14   Fall   . COPD (chronic obstructive pulmonary disease) ([place])   . Diabetes mellitus without complication ([place])   . Dialysis patient ([place])   . Diplopia   . Essential (primary) hypertension 02/12/2015  . Family history of thyroid problem   . GERD (gastroesophageal reflux disease)   . Heart disease    enlarged because of COPD  . Heart murmur   . HLD (hyperlipidemia)   . Hyperkalemia   . Hypertension   . Hyperthyroidism   . Morbid obesity ([place])   . Multiple thyroid nodules   . OAB (overactive bladder)   . Pernicious anemia   . Post-concussion syndrome 03/08/2014  . Renal disease    w/ GFR 29-may be due to diabetes  . Seizures ([place])   . Sleep apnea   . 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 Transient alteration of awareness 11/10/2012  . Urinary incontinence with continuous leakage 03/08/2014    Past Surgical History:  Procedure Laterality Date  . AVF    . CHOLECYSTECTOMY    . ENDOMETRIAL BIOPSY     ablation, uterine  . HERNIA REPAIR    . PERIPHERAL VASCULAR CATHETERIZATION N/A 08/23/2015   Procedure: Dialysis/Perma Catheter  Insertion;  Surgeon: Algernon Huxley, MD;  Location: [facility];  Service: Cardiovascular;  Laterality: N/A;  . PERIPHERAL VASCULAR CATHETERIZATION N/A 11/09/2015   Procedure: Dialysis/Perma Catheter Removal;  Surgeon: Katha Cabal, MD;  Location: [facility];  Service: Cardiovascular;  Laterality: N/A;  . PERIPHERAL VASCULAR CATHETERIZATION Left 04/23/2016   Procedure: A/V Fistulagram;  Surgeon: Algernon Huxley, MD;  Location: [facility];  Service: Cardiovascular;  Laterality: Left;  . PORT A CATH REVISION      Social History Social History  Substance Use Topics  . Smoking status: Never Smoker  . Smokeless tobacco: Never  Used  . Alcohol use No  No IVDU  Family History Family History  Problem Relation Age of Onset  . Diabetes Mother   . Lumbar disc disease Mother   . Migraines Mother   . Hypertension Mother   . Cancer - Prostate Father     mild  . Diabetes Father   . Hypertension Father   . Breast cancer Neg Hx   . Ovarian cancer Neg Hx   . Colon cancer Neg Hx   . Heart disease Neg Hx   . Kidney cancer Neg Hx   . Bladder Cancer Neg Hx     No family history of bleeding or clotting disorders, autoimmune disease or porphyria  Allergies  Allergen Reactions  . Cinnamon     Other reaction(s): Unknown  . Procrit [Epoetin Alfa]     Other reaction(s): Other (See Comments) Seizures, grand mal Seizures, grand mal  . Ace Inhibitors Cough  . Azithromycin     Other reaction(s): Other (See Comments) seizures  . Iron     Can not take in IV Form - causes seizures   . Neomycin-Bacitracin Zn-Polymyx     Other reaction(s): Unknown  . Sulfa Antibiotics     Other reaction(s): Other (See Comments) Due to risks for seizures.     REVIEW OF SYSTEMS (Negative unless checked)  Constitutional: [] Weight loss  [] Fever  [] Chills Cardiac: [] Chest pain   [] Chest pressure   [] Palpitations   [] Shortness of breath when laying flat   [] Shortness of breath at rest   [x] Shortness of breath with exertion. Vascular:  [] Pain in legs with walking   [] Pain in legs at rest   [] Pain in legs when laying flat   [] Claudication   [] Pain in feet when walking  [] Pain in feet at rest  [] Pain in feet when laying flat   [] History of DVT   [] Phlebitis   [] Swelling in legs   [] Varicose veins   [] Non-healing ulcers Pulmonary:   [] Uses home oxygen   [] Productive cough   [] Hemoptysis   [] Wheeze  [] COPD   [] Asthma Neurologic:  [] Dizziness  [] Blackouts   [] Seizures   [] History of stroke   [] History of TIA  [] Aphasia   [] Temporary blindness   [] Dysphagia   [] Weakness or numbness in arms   [] Weakness or numbness in legs Musculoskeletal:   [] Arthritis   [] Joint swelling   [] Joint pain   [] Low back pain Hematologic:  [] Easy bruising  [] Easy bleeding   [] Hypercoagulable state   [] Anemic  [] Hepatitis Gastrointestinal:  [] Blood in stool   [] Vomiting blood  [] Gastroesophageal reflux/heartburn   [] Difficulty swallowing. Genitourinary:  [x] Chronic kidney disease   [] Difficult urination  [] Frequent urination  [] Burning with urination   [] Blood in urine Skin:  [] Rashes   [] Ulcers   [] Wounds Psychological:  [] History of anxiety   []  History of major depression.  Physical Examination  Vitals:   07/21/16 0713  BP: (!) 162/68  Pulse: 73  Resp: 14  Temp: 98.2 F (36.8 C)  SpO2: 97%  Weight: 88.5 kg (195 lb)  Height: 5\' 2"  (1.575 m)   Body mass index is 35.67 kg/m. Gen: WD/WN, NAD Head: [place]/AT, No temporalis wasting. Prominent temp pulse not noted. Ear/Nose/Throat: Hearing grossly intact, nares w/o erythema or drainage, oropharynx w/o Erythema/Exudate,  Eyes: Conjunctiva clear, sclera non-icteric Neck: Trachea midline.  No JVD.  Pulmonary:  Good air movement, respirations not labored, no use of accessory muscles.  Cardiac: RRR, normal S1, S2. Vascular: weak thrill in AVF left arm Vessel Right Left  Radial Palpable Palpable  Ulnar Not Palpable Not Palpable  Brachial Palpable Palpable  Carotid Palpable, without bruit Palpable, without bruit  Gastrointestinal: soft, non-tender/non-distended. No guarding/reflex.  Musculoskeletal: M/S 5/5 throughout.  Extremities without ischemic changes.  No deformity or atrophy.  Neurologic: Sensation grossly intact in extremities.  Symmetrical.  Speech is fluent. Motor exam as listed above. Psychiatric: Judgment intact, Mood & affect appropriate for pt's clinical situation. Dermatologic: No rashes or ulcers noted.  No cellulitis or open wounds. Lymph : No Cervical, Axillary, or Inguinal lymphadenopathy.   CBC Lab Results  Component Value Date   WBC 8.7 04/10/2016   HGB 8.1 (L) 04/10/2016    HCT 23.1 (L) 04/10/2016   MCV 101.6 (H) 04/10/2016   PLT 211 04/10/2016    BMET    Component Value Date/Time   NA 139 04/10/2016 1539   NA 142 03/08/2014 1634   NA 141 07/04/2011 0747   K 3.0 (L) 04/10/2016 1539   K 4.6 07/04/2011 0747   CL 96 (L) 04/10/2016 1539   CL 109 (H) 07/04/2011 0747   CO2 28 04/10/2016 1539   CO2 20 (L) 07/04/2011 0747   GLUCOSE 176 (H) 04/10/2016 1539   GLUCOSE 198 (H) 07/04/2011 0747   BUN 16 04/10/2016 1539   BUN 82 (HH) 03/08/2014 1634   BUN 65 (H) 07/04/2011 0747   CREATININE 1.98 (H) 04/10/2016 1539   CREATININE 1.95 (H) 07/04/2011 0747   CALCIUM 9.1 04/10/2016 1539   CALCIUM 8.6 07/04/2011 0747   GFRNONAA 27 (L) 04/10/2016 1539   GFRNONAA 29 (L) 07/04/2011 0747   GFRAA 31 (L) 04/10/2016 1539   GFRAA 35 (L) 07/04/2011 0747   CrCl cannot be calculated (Patient's most recent lab result is older than the maximum 21 days allowed.).  COAG Lab Results  Component Value Date   INR 0.96 04/10/2016   INR 1.0 07/04/2011    Radiology No results found.  Assessment/Plan 1.  Complication dialysis device with thrombosis AV access:  Patient's left dialysis access is malfunctioning. The patient will undergo angiography and correction of any problems using interventional techniques with the hope of restoring function to the access.  The risks and benefits were described to the patient.  All questions were answered.  The patient agrees to proceed with angiography and intervention. Potassium will be drawn to ensure that it is an appropriate level prior to performing intervention. 2.  End-stage renal disease requiring hemodialysis:  Patient will continue dialysis therapy without further interruption if a successful intervention is not achieved then a tunneled catheter will be placed. Dialysis has already been arranged. 3.  Hypertension:  Patient will continue medical management; nephrology is following no changes in oral medications. 4. Diabetes mellitus:   Glucose will be monitored and oral medications been held this morning once the patient has undergone the patient's  procedure po intake will be reinitiated and again Accu-Cheks will be used to assess the blood glucose level and treat as needed. The patient will be restarted on the patient's usual hypoglycemic regime    Leotis Pain, MD  07/21/2016 8:05 AM

## 2016-07-22 ENCOUNTER — Encounter: Payer: Self-pay | Admitting: Vascular Surgery

## 2016-08-18 ENCOUNTER — Telehealth: Payer: Self-pay

## 2016-08-18 NOTE — Telephone Encounter (Signed)
Pt stopped my office requesting more samples of vesicare and a letter sent to insurance company for PA. Reinforced with pt PA has been completed 2x and both times were denied. Also made pt aware will not be able to keep supplied with samples as [facility] does not get many anymore.  Pt requested another cheaper medication. Pt wanted to make [facility] aware she has grand mal seizures and would like a medication that does not enhance seizures. Please advise. Pt also wanted to make [facility] aware she is not going to have a kidney transplant at this time.

## 2016-08-19 NOTE — Telephone Encounter (Signed)
Spoke with pt father in reference to calling insurance company to find out which medications insurance will cover. Father voiced understanding.

## 2016-08-19 NOTE — Telephone Encounter (Signed)
Would she be able to contact her insurance company to see which medications are on her formulary?

## 2016-09-04 ENCOUNTER — Other Ambulatory Visit: Payer: Self-pay

## 2016-09-04 NOTE — Telephone Encounter (Signed)
I received a refill request for pt's lamictal from [facility]. However, it appears pt received a year's supply of lamictal on 04/16/2016, sent to [facility]. I called pt to clarify, spoke to pt's father, Tylin Stradley, per DPR. He will aks the pt to call us back to clarify.

## 2016-09-04 NOTE — Telephone Encounter (Signed)
Pt's mother returned my call. She says that sometimes Nina Johnston sends their RXs for [facility] to fill. I advised her that a year's supply of lamictal was sent to [facility] on 04/16/2016, and it may be best for her to contact Nina Johnston to discuss the appropriate transfer of the RX. Pt's mother will do this and call me back with any concerns.

## 2016-10-06 ENCOUNTER — Other Ambulatory Visit: Payer: Self-pay | Admitting: Neurology

## 2016-10-06 ENCOUNTER — Telehealth: Payer: Self-pay | Admitting: Neurology

## 2016-10-06 DIAGNOSIS — R270 Ataxia, unspecified: Secondary | ICD-10-CM

## 2016-10-06 DIAGNOSIS — R404 Transient alteration of awareness: Secondary | ICD-10-CM

## 2016-10-06 DIAGNOSIS — R4789 Other speech disturbances: Secondary | ICD-10-CM

## 2016-10-06 MED ORDER — CARBAMAZEPINE ER 100 MG PO TB12: ORAL_TABLET | ORAL | 1 refills | Status: DC

## 2016-10-06 MED ORDER — LAMOTRIGINE 200 MG PO TABS: ORAL_TABLET | ORAL | 3 refills | Status: DC

## 2016-10-06 NOTE — Telephone Encounter (Signed)
Pt mother calling to refill lamoTRIgine (LAMICTAL) 200 MG tablet   carbamazepine (TEGRETOL-XR) 100 MG 12 hr tablet At  [facility] [place], [place] [phone] (Phone) [phone] (Fax)

## 2016-10-06 NOTE — Addendum Note (Signed)
Addended by: France Ravens I on: 10/06/2016 02:53 PM   Modules accepted: Orders

## 2016-10-06 NOTE — Telephone Encounter (Signed)
Pt. last seen in 04/16/17.  Pending appt. 12/03/16.  Total 2 refills of Lamictal and Tegretol escribed to [facility]/fim

## 2016-10-07 ENCOUNTER — Telehealth: Payer: Self-pay | Admitting: *Deleted

## 2016-10-07 NOTE — Telephone Encounter (Signed)
Called and spoke with [place] from [place]. Advised I attempted to start PA on covermymeds several times. Advised I was getting response that "unable to find eligibility".  She stated that when putting in pt name, have to list Nina Johnston". Make sure there is a space in last name.   I was able to initiate PA on covermymeds.  Needed additional info for PA. LVM for patient mother to call back. Gave [facility] phone number.

## 2016-10-08 NOTE — Telephone Encounter (Signed)
Called and spoke with patient's mother. She stated patient tired lithium and depakote prior to Dr Brett Fairy changing her to generic lamictal. She had seizures on generic and was changed to brand name Lamictal. This was greater than 5 years ago. Patient doing well on medication.  She only has Thursday and Friday doses left of medication.  Advised we will send PA as urgent request. I will call with any updates. She verbalized understanding.

## 2016-10-08 NOTE — Telephone Encounter (Signed)
[facility] and spoke with Estill Bamberg. Notified them of approval.  Called and spoke with mother. Advised her rx Lamictal approved. She verbalized understanding and appreciation for call.

## 2016-10-08 NOTE — Telephone Encounter (Signed)
Nakia with [facility] called to confirm medication for Lamictal has been approved 10/08/16-10/08/17 no PA number to give.

## 2016-10-08 NOTE — Telephone Encounter (Signed)
Submitted PA as urgent request on covermymeds. Key: [id number]. Awaiting response.

## 2016-10-15 ENCOUNTER — Ambulatory Visit: Payer: Medicare Other | Admitting: Neurology

## 2016-10-15 ENCOUNTER — Encounter: Payer: Medicare Other | Admitting: Obstetrics and Gynecology

## 2016-10-20 NOTE — Progress Notes (Signed)
ANNUAL PREVENTATIVE CARE GYN  ENCOUNTER NOTE  Subjective:       Nina Johnston is a 56 y.o. No obstetric history on file. female here for a routine annual gynecologic exam.  Current complaints: 1.  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 Bowel and bladder function are normal She reports past history of hernia repair at [facility] this year; surgery was complicated by blood loss with need for transfusion 2.    Gynecologic History No LMP recorded. Patient has had an ablation. Contraception: post menopausal status Last Pap: 10/2015 reflex neg Last mammogram: 03/2016 birad 1. Results were: normal  Obstetric History OB History  No data available    Past Medical History:  Diagnosis Date  . Acute bacterial sinusitis 04/19/2014  . Anxiety and depression   . Asthma   . 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 Benign essential HTN 12/26/2014  . Bipolar disorder ([place])   . Bronchitis   . Carotid artery narrowing 12/22/2014  . CKD (chronic kidney disease)   . Concussion 01-14-14   Fall   . COPD (chronic obstructive pulmonary disease) ([place])   . Diabetes mellitus without complication ([place])   . Dialysis patient ([place])   . Diplopia   . Essential (primary) hypertension 02/12/2015  . Family history of thyroid problem   . GERD (gastroesophageal reflux disease)   . Heart disease    enlarged because of COPD  . Heart murmur   . HLD (hyperlipidemia)   . Hyperkalemia   . Hypertension   . Hyperthyroidism   . Morbid obesity ([place])   . Multiple thyroid nodules   . OAB (overactive bladder)   . Pernicious anemia   . Post-concussion syndrome 03/08/2014  . Renal disease    w/ GFR 29-may be due to diabetes  . Seizures ([place])   . Sleep apnea   . 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 Transient alteration of awareness 11/10/2012  . Urinary incontinence with continuous leakage 03/08/2014    Past  Surgical History:  Procedure Laterality Date  . A/V SHUNT INTERVENTION N/A 07/21/2016   Procedure: A/V Shunt Intervention;  Surgeon: Algernon Huxley, MD;  Location: [facility];  Service: Cardiovascular;  Laterality: N/A;  . A/V SHUNTOGRAM Left 07/21/2016   Procedure: A/V Fistulagram;  Surgeon: Algernon Huxley, MD;  Location: [facility];  Service: Cardiovascular;  Laterality: Left;  . AVF    . CHOLECYSTECTOMY    . ENDOMETRIAL BIOPSY     ablation, uterine  . HERNIA REPAIR    . PERIPHERAL VASCULAR CATHETERIZATION N/A 08/23/2015   Procedure: Dialysis/Perma Catheter Insertion;  Surgeon: Algernon Huxley, MD;  Location: [facility];  Service: Cardiovascular;  Laterality: N/A;  . PERIPHERAL VASCULAR CATHETERIZATION N/A 11/09/2015   Procedure: Dialysis/Perma Catheter Removal;  Surgeon: Katha Cabal, MD;  Location: [facility];  Service: Cardiovascular;  Laterality: N/A;  . PERIPHERAL VASCULAR CATHETERIZATION Left 04/23/2016   Procedure: A/V Fistulagram;  Surgeon: Algernon Huxley, MD;  Location: [facility];  Service: Cardiovascular;  Laterality: Left;  . PORT A CATH REVISION      Current Outpatient Prescriptions on File Prior to Visit  Medication Sig Dispense Refill  . amLODipine (NORVASC) 10 MG tablet Take 10 mg by mouth 2 (two) times daily.     Marland Kitchen aspirin 81 MG tablet Take 81  mg by mouth daily.     Marland Kitchen b complex vitamins tablet Take 1 tablet by mouth daily.    . carbamazepine (TEGRETOL-XR) 100 MG 12 hr tablet Two tablets in the morning and one tablet in the evening.  Please keep pending appt. on December 03, 2016 in order to receive future refills. 90 tablet 1  . clonazePAM (KLONOPIN) 1 MG tablet TAKE ONE TABLET BY MOUTH AT BEDTIME 30 tablet 5  . cyanocobalamin (,VITAMIN B-12,) 1000 MCG/ML injection Inject 1,000 mcg into the muscle every 14 (fourteen) days.     Mariane Baumgarten Calcium (STOOL SOFTENER PO) Take 1 capsule by mouth every morning.     Marland Kitchen ethosuximide (ZARONTIN)  250 MG/5ML solution TAKE 5ML BY MOUTH 2 TIMES DAILY 474 mL 3  . Febuxostat (ULORIC) 80 MG TABS Take 1 tablet by mouth daily. Morning dosage     . fexofenadine (ALLEGRA) 180 MG tablet Take 180 mg by mouth daily. am    . Fluticasone-Salmeterol (ADVAIR DISKUS) 250-50 MCG/DOSE AEPB Inhale 1 puff into the lungs 2 (two) times daily.    . folic acid-vitamin b complex-vitamin c-selenium-zinc (DIALYVITE) 3 MG TABS tablet Take 1 tablet by mouth daily.    . furosemide (LASIX) 20 MG tablet Take 30 mg by mouth 2 (two) times daily.    Marland Kitchen gemfibrozil (LOPID) 600 MG tablet Take 600 mg by mouth 2 (two) times daily before a meal.    . glucose blood (ONE TOUCH ULTRA TEST) test strip Use 4 (four) times daily. Test 3 - 4 times daily E11.21    . GuaiFENesin (MUCINEX PO) Take 1 tablet by mouth 2 (two) times daily as needed. Expectorant, as needed     . hydrALAZINE (APRESOLINE) 50 MG tablet Take 100 mg by mouth 2 (two) times daily.     . insulin glargine (LANTUS) 100 UNIT/ML injection Inject 10 Units into the skin at bedtime.     . insulin lispro (HUMALOG) 100 UNIT/ML injection Inject 2-4 Units into the skin 2 (two) times daily with a meal. Sliding scale takes in sugars are over 200-299 2 units, 300-399 3 units, over 400 4 units.     . Insulin Pen Needle (BD PEN NEEDLE NANO U/F) 32G X 4 MM MISC     . lamoTRIgine (LAMICTAL) 200 MG tablet Brand Medically Necessary  Please keep pending appt. on Aug. 1, 2018, in order to receive future refills. 270 tablet 3  . levothyroxine (SYNTHROID) 150 MCG tablet Take 150 mcg by mouth daily before breakfast. Am dosage     . metoprolol succinate (TOPROL XL) 50 MG 24 hr tablet Take 1 tablet (50 mg total) by mouth 2 (two) times daily. Take with or immediately following a meal. 60 tablet 11  . montelukast (SINGULAIR) 10 MG tablet Take 10 mg by mouth at bedtime.    . pantoprazole (PROTONIX) 40 MG tablet Take 40 mg by mouth every morning.     . Probiotic Product (ALIGN) 4 MG CAPS Take 1 capsule  by mouth daily.    . sevelamer carbonate (RENVELA) 800 MG tablet Take 800-3,200 mg by mouth See admin instructions. 4  Tablets 3 times daily with meals, and 1 tablet with snacks    . solifenacin (VESICARE) 5 MG tablet Take 1 tablet (5 mg total) by mouth daily. 30 tablet 11  . triamcinolone (NASACORT ALLERGY 24HR) 55 MCG/ACT AERO nasal inhaler Place 2 sprays into the nose 2 (two) times daily.      No current facility-administered medications on  file prior to visit.     Allergies  Allergen Reactions  . Cinnamon     Other reaction(s): Unknown  . Procrit [Epoetin Alfa]     Other reaction(s): Other (See Comments) Seizures, grand mal Seizures, grand mal  . Ace Inhibitors Cough  . Azithromycin     Other reaction(s): Other (See Comments) seizures  . Iron     Can not take in IV Form - causes seizures   . Neomycin-Bacitracin Zn-Polymyx     Other reaction(s): Unknown  . Sulfa Antibiotics     Other reaction(s): Other (See Comments) Due to risks for seizures.    Social History   Social History  . Marital status: Single    Spouse name: N/A  . Number of children: 0  . Years of education:  college   Occupational History  . not employed    Social History Main Topics  . Smoking status: Never Smoker  . Smokeless tobacco: Never Used  . Alcohol use No  . Drug use: No  . Sexual activity: Not Currently   Other Topics Concern  . Not on file   Social History Narrative   Patient is single and lives with her parents.   Patient is disabled.   Patient has a college education.   Patient is right- handed.   Patient drinks tea occasionally. 2 glasses of tea when they eat out.    Family History  Problem Relation Age of Onset  . Diabetes Mother   . Lumbar disc disease Mother   . Migraines Mother   . Hypertension Mother   . Cancer - Prostate Father        mild  . Diabetes Father   . Hypertension Father   . Breast cancer Neg Hx   . Ovarian cancer Neg Hx   . Colon cancer Neg Hx   .  Heart disease Neg Hx   . Kidney cancer Neg Hx   . Bladder Cancer Neg Hx     The following portions of the patient's history were reviewed and updated as appropriate: allergies, current medications, past family history, past medical history, past social history, past surgical history and problem list.  Review of Systems Review of Systems  Constitutional: Positive for malaise/fatigue and weight loss.       No vasomotor symptoms  HENT: Negative.   Eyes: Negative.   Respiratory: Negative.   Cardiovascular: Negative.   Gastrointestinal:       Firmness around the umbilicus following hernia repair  Genitourinary: Negative.   Musculoskeletal: Negative.   Skin:       Intermittent monilia rash under the breast and in the groin  Neurological: Negative.   Endo/Heme/Allergies: Negative.   Psychiatric/Behavioral: Negative.       Objective:   BP 129/69   Pulse 91   Ht 5\' 2"  (1.575 m)   Wt 204 lb 12.8 oz (92.9 kg)   BMI 37.46 kg/m  CONSTITUTIONAL: Well-developed, well-nourished female in no acute distress.  PSYCHIATRIC: Normal mood and affect. Normal behavior. Normal judgment and thought content. [place]: Alert and oriented to person, place, and time. Normal muscle tone coordination. No cranial nerve deficit noted. HENT:  Normocephalic, atraumatic EYES: Conjunctivae and EOM are normal. No scleral icterus.  NECK: Normal range of motion, supple, no masses.  Normal thyroid. Port-A-Cath is present in right upper thorax SKIN: Skin is warm and dry. No rash noted. Not diaphoretic. No erythema. No pallor. CARDIOVASCULAR: Normal heart rate noted, regular rhythm, no murmur. RESPIRATORY: Clear to  auscultation bilaterally. Effort and breath sounds normal, no problems with respiration noted. BREASTS: Symmetric in size. No masses, skin changes, nipple drainage, or lymphadenopathy. ABDOMEN: Soft, normal bowel sounds, no distention noted.  No tenderness, rebound or guarding. Supraumbilical hernia  repair incision is well approximated; 3 x 4 cm area of firmness with graft palpable, nontender BLADDER: Normal PELVIC:  External Genitalia: Normal  BUS: Normal  Vagina: Normal; Good support  Cervix: Normal; No motion tenderness; cervix comes to within 3-4 cm introitus; no lesions  Uterus: Normal; Midplane, normal size and shape, mobile, nontender  Adnexa: Normal; Nonpalpable, nontender  RV: External Exam NormaI, No Rectal Masses and Normal Sphincter tone  MUSCULOSKELETAL: Normal range of motion. No tenderness.  No cyanosis, clubbing, or edema.  2+ distal pulses. LYMPHATIC: No Axillary, Supraclavicular, or Inguinal Adenopathy.    Assessment:   Annual gynecologic examination 56 y.o. Contraception: post menopausal status Obesity 2 Status post endometrial ablation History of renal failure; currently on dialysis History of multiple comorbidities including hypertension, COPD, diabetes mellitus, GERD Status post umbilical hernia repair with mesh  Plan Pap- not needed Mammogram: Ordered Stool Guaiac Testing:  Ordered Labs: primary care Routine preventative health maintenance measures emphasized: Exercise/Diet/Weight control, Tobacco Warnings and Alcohol/Substance use risks Return to [place], CMA  Brayton Mars, MD   Note: This dictation was prepared with Dragon dictation along with smaller phrase technology. Any transcriptional errors that result from this process are unintentional.

## 2016-10-22 ENCOUNTER — Ambulatory Visit (INDEPENDENT_AMBULATORY_CARE_PROVIDER_SITE_OTHER): Payer: Medicare Other | Admitting: Obstetrics and Gynecology

## 2016-10-22 ENCOUNTER — Encounter: Payer: Self-pay | Admitting: Obstetrics and Gynecology

## 2016-10-22 VITALS — BP 129/69 | HR 91 | Ht 62.0 in | Wt 204.8 lb

## 2016-10-22 DIAGNOSIS — Z1239 Encounter for other screening for malignant neoplasm of breast: Secondary | ICD-10-CM

## 2016-10-22 DIAGNOSIS — Z01419 Encounter for gynecological examination (general) (routine) without abnormal findings: Secondary | ICD-10-CM

## 2016-10-22 DIAGNOSIS — Z1231 Encounter for screening mammogram for malignant neoplasm of breast: Secondary | ICD-10-CM | POA: Diagnosis not present

## 2016-10-22 DIAGNOSIS — Z780 Asymptomatic menopausal state: Secondary | ICD-10-CM | POA: Diagnosis not present

## 2016-10-22 DIAGNOSIS — Z6837 Body mass index (BMI) 37.0-37.9, adult: Secondary | ICD-10-CM

## 2016-10-22 DIAGNOSIS — Z9889 Other specified postprocedural states: Secondary | ICD-10-CM | POA: Insufficient documentation

## 2016-10-22 DIAGNOSIS — Z1211 Encounter for screening for malignant neoplasm of colon: Secondary | ICD-10-CM

## 2016-10-22 MED ORDER — NYSTATIN-TRIAMCINOLONE 100000-0.1 UNIT/GM-% EX CREA: 1.0000 "application " | TOPICAL_CREAM | Freq: Two times a day (BID) | CUTANEOUS | 1 refills | Status: DC

## 2016-10-22 NOTE — Patient Instructions (Addendum)
1. No Pap smear needed 2. Mammogram ordered 3. Stool guaiac cards are given for colon cancer screening 4. Healthy eating with exercises encouraged along with controlled weight loss 5. Screening labs are to be obtained through primary care 6. Return in 1 year for annual exam   Health Maintenance for Postmenopausal Women Menopause is a normal process in which your reproductive ability comes to an end. This process happens gradually over a span of months to years, usually between the ages of 20 and 77. Menopause is complete when you have missed 12 consecutive menstrual periods. It is important to talk with your health care provider about some of the most common conditions that affect postmenopausal women, such as heart disease, cancer, and bone loss (osteoporosis). Adopting a healthy lifestyle and getting preventive care can help to promote your health and wellness. Those actions can also lower your chances of developing some of these common conditions. What should I know about menopause? During menopause, you may experience a number of symptoms, such as:  Moderate-to-severe hot flashes.  Night sweats.  Decrease in sex drive.  Mood swings.  Headaches.  Tiredness.  Irritability.  Memory problems.  Insomnia.  Choosing to treat or not to treat menopausal changes is an individual decision that you make with your health care provider. What should I know about hormone replacement therapy and supplements? Hormone therapy products are effective for treating symptoms that are associated with menopause, such as hot flashes and night sweats. Hormone replacement carries certain risks, especially as you become older. If you are thinking about using estrogen or estrogen with progestin treatments, discuss the benefits and risks with your health care provider. What should I know about heart disease and stroke? Heart disease, heart attack, and stroke become more likely as you age. This may be due, in  part, to the hormonal changes that your body experiences during menopause. These can affect how your body processes dietary fats, triglycerides, and cholesterol. Heart attack and stroke are both medical emergencies. There are many things that you can do to help prevent heart disease and stroke:  Have your blood pressure checked at least every 1-2 years. High blood pressure causes heart disease and increases the risk of stroke.  If you are 41-50 years old, ask your health care provider if you should take aspirin to prevent a heart attack or a stroke.  Do not use any tobacco products, including cigarettes, chewing tobacco, or electronic cigarettes. If you need help quitting, ask your health care provider.  It is important to eat a healthy diet and maintain a healthy weight. ? Be sure to include plenty of vegetables, fruits, low-fat dairy products, and lean protein. ? Avoid eating foods that are high in solid fats, added sugars, or salt (sodium).  Get regular exercise. This is one of the most important things that you can do for your health. ? Try to exercise for at least 150 minutes each week. The type of exercise that you do should increase your heart rate and make you sweat. This is known as moderate-intensity exercise. ? Try to do strengthening exercises at least twice each week. Do these in addition to the moderate-intensity exercise.  Know your numbers.Ask your health care provider to check your cholesterol and your blood glucose. Continue to have your blood tested as directed by your health care provider.  What should I know about cancer screening? There are several types of cancer. Take the following steps to reduce your risk and to catch any  cancer development as early as possible. Breast Cancer  Practice breast self-awareness. ? This means understanding how your breasts normally appear and feel. ? It also means doing regular breast self-exams. Let your health care provider know about  any changes, no matter how small.  If you are 57 or older, have a clinician do a breast exam (clinical breast exam or CBE) every year. Depending on your age, family history, and medical history, it may be recommended that you also have a yearly breast X-ray (mammogram).  If you have a family history of breast cancer, talk with your health care provider about genetic screening.  If you are at high risk for breast cancer, talk with your health care provider about having an MRI and a mammogram every year.  Breast cancer (BRCA) gene test is recommended for women who have family members with BRCA-related cancers. Results of the assessment will determine the need for genetic counseling and BRCA1 and for BRCA2 testing. BRCA-related cancers include these types: ? Breast. This occurs in males or females. ? Ovarian. ? Tubal. This may also be called fallopian tube cancer. ? Cancer of the abdominal or pelvic lining (peritoneal cancer). ? Prostate. ? Pancreatic.  Cervical, Uterine, and Ovarian Cancer Your health care provider may recommend that you be screened regularly for cancer of the pelvic organs. These include your ovaries, uterus, and vagina. This screening involves a pelvic exam, which includes checking for microscopic changes to the surface of your cervix (Pap test).  For women ages 21-65, health care providers may recommend a pelvic exam and a Pap test every three years. For women ages 64-65, they may recommend the Pap test and pelvic exam, combined with testing for human papilloma virus (HPV), every five years. Some types of HPV increase your risk of cervical cancer. Testing for HPV may also be done on women of any age who have unclear Pap test results.  Other health care providers may not recommend any screening for nonpregnant women who are considered low risk for pelvic cancer and have no symptoms. Ask your health care provider if a screening pelvic exam is right for you.  If you have had  past treatment for cervical cancer or a condition that could lead to cancer, you need Pap tests and screening for cancer for at least 20 years after your treatment. If Pap tests have been discontinued for you, your risk factors (such as having a new sexual partner) need to be reassessed to determine if you should start having screenings again. Some women have medical problems that increase the chance of getting cervical cancer. In these cases, your health care provider may recommend that you have screening and Pap tests more often.  If you have a family history of uterine cancer or ovarian cancer, talk with your health care provider about genetic screening.  If you have vaginal bleeding after reaching menopause, tell your health care provider.  There are currently no reliable tests available to screen for ovarian cancer.  Lung Cancer Lung cancer screening is recommended for adults 50-66 years old who are at high risk for lung cancer because of a history of smoking. A yearly low-dose CT scan of the lungs is recommended if you:  Currently smoke.  Have a history of at least 30 pack-years of smoking and you currently smoke or have quit within the past 15 years. A pack-year is smoking an average of one pack of cigarettes per day for one year.  Yearly screening should:  Continue until  it has been 15 years since you quit.  Stop if you develop a health problem that would prevent you from having lung cancer treatment.  Colorectal Cancer  This type of cancer can be detected and can often be prevented.  Routine colorectal cancer screening usually begins at age 76 and continues through age 38.  If you have risk factors for colon cancer, your health care provider may recommend that you be screened at an earlier age.  If you have a family history of colorectal cancer, talk with your health care provider about genetic screening.  Your health care provider may also recommend using home test kits to  check for hidden blood in your stool.  A small camera at the end of a tube can be used to examine your colon directly (sigmoidoscopy or colonoscopy). This is done to check for the earliest forms of colorectal cancer.  Direct examination of the colon should be repeated every 5-10 years until age 61. However, if early forms of precancerous polyps or small growths are found or if you have a family history or genetic risk for colorectal cancer, you may need to be screened more often.  Skin Cancer  Check your skin from head to toe regularly.  Monitor any moles. Be sure to tell your health care provider: ? About any new moles or changes in moles, especially if there is a change in a mole's shape or color. ? If you have a mole that is larger than the size of a pencil eraser.  If any of your family members has a history of skin cancer, especially at a young age, talk with your health care provider about genetic screening.  Always use sunscreen. Apply sunscreen liberally and repeatedly throughout the day.  Whenever you are outside, protect yourself by wearing long sleeves, pants, a wide-brimmed hat, and sunglasses.  What should I know about osteoporosis? Osteoporosis is a condition in which bone destruction happens more quickly than new bone creation. After menopause, you may be at an increased risk for osteoporosis. To help prevent osteoporosis or the bone fractures that can happen because of osteoporosis, the following is recommended:  If you are 53-45 years old, get at least 1,000 mg of calcium and at least 600 mg of vitamin D per day.  If you are older than age 45 but younger than age 26, get at least 1,200 mg of calcium and at least 600 mg of vitamin D per day.  If you are older than age 20, get at least 1,200 mg of calcium and at least 800 mg of vitamin D per day.  Smoking and excessive alcohol intake increase the risk of osteoporosis. Eat foods that are rich in calcium and vitamin D, and  do weight-bearing exercises several times each week as directed by your health care provider. What should I know about how menopause affects my mental health? Depression may occur at any age, but it is more common as you become older. Common symptoms of depression include:  Low or sad mood.  Changes in sleep patterns.  Changes in appetite or eating patterns.  Feeling an overall lack of motivation or enjoyment of activities that you previously enjoyed.  Frequent crying spells.  Talk with your health care provider if you think that you are experiencing depression. What should I know about immunizations? It is important that you get and maintain your immunizations. These include:  Tetanus, diphtheria, and pertussis (Tdap) booster vaccine.  Influenza every year before the flu season  begins.  Pneumonia vaccine.  Shingles vaccine.  Your health care provider may also recommend other immunizations. This information is not intended to replace advice given to you by your health care provider. Make sure you discuss any questions you have with your health care provider. Document Released: 06/13/2005 Document Revised: 11/09/2015 Document Reviewed: 01/23/2015 Elsevier Interactive Patient Education  2018 [employer].

## 2016-10-30 LAB — PLEASE NOTE

## 2016-10-30 LAB — FECAL OCCULT BLOOD, IMMUNOCHEMICAL: Fecal Occult Bld: POSITIVE — AB

## 2016-11-03 ENCOUNTER — Other Ambulatory Visit: Payer: Self-pay

## 2016-11-03 DIAGNOSIS — R195 Other fecal abnormalities: Secondary | ICD-10-CM

## 2016-11-07 ENCOUNTER — Ambulatory Visit (INDEPENDENT_AMBULATORY_CARE_PROVIDER_SITE_OTHER): Payer: Medicare Other | Admitting: Vascular Surgery

## 2016-11-07 ENCOUNTER — Encounter (INDEPENDENT_AMBULATORY_CARE_PROVIDER_SITE_OTHER): Payer: Self-pay | Admitting: Vascular Surgery

## 2016-11-07 VITALS — BP 131/82 | HR 74 | Resp 17 | Ht 65.0 in | Wt 203.0 lb

## 2016-11-07 DIAGNOSIS — I10 Essential (primary) hypertension: Secondary | ICD-10-CM

## 2016-11-07 DIAGNOSIS — E118 Type 2 diabetes mellitus with unspecified complications: Secondary | ICD-10-CM

## 2016-11-07 DIAGNOSIS — Z992 Dependence on renal dialysis: Secondary | ICD-10-CM | POA: Diagnosis not present

## 2016-11-07 DIAGNOSIS — N186 End stage renal disease: Secondary | ICD-10-CM | POA: Diagnosis not present

## 2016-11-07 NOTE — Progress Notes (Signed)
MRN : [id number]  Nina Johnston is a 56 y.o. (Jan 15, 1961) female who presents with chief complaint of  Chief Complaint  Patient presents with  . Re-evaluation    Follow up  .  History of Present Illness: Patient returns today in follow up of her dialysis access.  She reports they're getting recirculation with dialysis and lower rates of flow within desired. She also reports that her blood pressure has been chronically low on dialysis. She says she's been running blood pressures of about 90/50. She does not have prolonged bleeding or pain.       Past Medical History:  Diagnosis Date  . Acute bacterial sinusitis 04/19/2014  . Anxiety and depression   . Asthma   . 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 Benign essential HTN 12/26/2014  . Bipolar disorder ([place])   . Bronchitis   . Carotid artery narrowing 12/22/2014  . CKD (chronic kidney disease)   . Concussion 01-14-14   Fall   . COPD (chronic obstructive pulmonary disease) ([place])   . Diabetes mellitus without complication ([place])   . Diplopia   . Essential (primary) hypertension 02/12/2015  . Family history of thyroid problem   . GERD (gastroesophageal reflux disease)   . Heart disease    enlarged because of COPD  . Heart murmur   . HLD (hyperlipidemia)   . Hyperkalemia   . Hypertension   . Hyperthyroidism   . Morbid obesity ([place])   . Multiple thyroid nodules   . OAB (overactive bladder)   . Pernicious anemia   . Post-concussion syndrome 03/08/2014  . Renal disease    w/ GFR 29-may be due to diabetes  . Seizures ([place])   . Sleep apnea   . 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 Transient alteration of awareness 11/10/2012  . Urinary incontinence with continuous leakage 03/08/2014         Past Surgical History:  Procedure Laterality Date  . AVF    . CHOLECYSTECTOMY    . ENDOMETRIAL BIOPSY     ablation, uterine    . HERNIA REPAIR    . PERIPHERAL VASCULAR CATHETERIZATION N/A 08/23/2015   Procedure: Dialysis/Perma Catheter Insertion;  Surgeon: Algernon Huxley, MD;  Location: [facility];  Service: Cardiovascular;  Laterality: N/A;  . PERIPHERAL VASCULAR CATHETERIZATION N/A 11/09/2015   Procedure: Dialysis/Perma Catheter Removal;  Surgeon: Katha Cabal, MD;  Location: [facility];  Service: Cardiovascular;  Laterality: N/A;  . PORT A CATH REVISION            Family History  Problem Relation Age of Onset  . Diabetes Mother   . Lumbar disc disease Mother   . Migraines Mother   . Hypertension Mother   . Cancer - Prostate Father     mild  . Diabetes Father   . Hypertension Father   . Breast cancer Neg Hx   . Ovarian cancer Neg Hx   . Colon cancer Neg Hx   . Heart disease Neg Hx      Social History     Social History  Substance Use Topics  . Smoking status: Never Smoker  . Smokeless tobacco: Never Used  . Alcohol use No  No IV drug use       Allergies  Allergen Reactions  . Cinnamon     Other reaction(s): Unknown  . Procrit [Epoetin Alfa]  Other reaction(s): Other (See Comments) Seizures, grand mal Seizures, grand mal  . Ace Inhibitors Cough  . Azithromycin     Other reaction(s): Other (See Comments) seizures  . Iron     Can not take in IV Form - causes seizures   . Neomycin-Bacitracin Zn-Polymyx     Other reaction(s): Unknown  . Sulfa Antibiotics     Other reaction(s): Other (See Comments) Due to risks for seizures.          Current Outpatient Prescriptions  Medication Sig Dispense Refill  . amLODipine (NORVASC) 10 MG tablet Take 10 mg by mouth daily.    Marland Kitchen aspirin 81 MG tablet Take 81 mg by mouth daily.     Marland Kitchen b complex vitamins tablet Take 1 tablet by mouth daily.    . carbamazepine (TEGRETOL-XR) 100 MG 12 hr tablet Two tablets in the morning and one tablet in the evening. 90 tablet 10  . cefdinir  (OMNICEF) 300 MG capsule Take 300 mg by mouth daily as needed. Reported on 06/06/2015    . chlorpheniramine-HYDROcodone (TUSSIONEX PENNKINETIC ER) 10-8 MG/5ML SUER Take 5 mLs by mouth every 12 (twelve) hours as needed for cough.    . clonazePAM (KLONOPIN) 1 MG tablet Take 1 tablet (1 mg total) by mouth at bedtime. 30 tablet 5  . cyanocobalamin (,VITAMIN B-12,) 1000 MCG/ML injection Inject 1,000 mcg into the muscle every 14 (fourteen) days.     Marland Kitchen dextromethorphan (DELSYM) 30 MG/5ML liquid Take 60 mg by mouth as needed for cough.    Mariane Baumgarten Calcium (STOOL SOFTENER PO) Take 1 capsule by mouth every morning.     Marland Kitchen ethosuximide (ZARONTIN) 250 MG/5ML solution TAKE 5ML BY MOUTH 2 TIMES DAILY 474 mL 3  . Febuxostat (ULORIC) 80 MG TABS Take 1 tablet by mouth daily. Morning dosage     . fexofenadine (ALLEGRA) 180 MG tablet Take 180 mg by mouth daily. am    . fluconazole (DIFLUCAN) 150 MG tablet Take 1 tablet (150 mg total) by mouth daily. (Patient taking differently: Take 150 mg by mouth daily as needed. ) 1 tablet 0  . Fluticasone-Salmeterol (ADVAIR DISKUS) 250-50 MCG/DOSE AEPB Inhale 1 puff into the lungs 2 (two) times daily.    . folic acid-vitamin b complex-vitamin c-selenium-zinc (DIALYVITE) 3 MG TABS tablet Take 1 tablet by mouth daily.    . furosemide (LASIX) 20 MG tablet Take 30 mg by mouth 2 (two) times daily.    Marland Kitchen gemfibrozil (LOPID) 600 MG tablet Take 600 mg by mouth 2 (two) times daily before a meal.    . glipiZIDE (GLUCOTROL) 5 MG tablet Take 5-10 mg by mouth See admin instructions. 10 mg in the morning, and 5 mg at dinner    . GuaiFENesin (MUCINEX PO) Take 1 tablet by mouth 2 (two) times daily as needed. Expectorant, as needed     . hydrALAZINE (APRESOLINE) 50 MG tablet Take 50 mg by mouth 2 (two) times daily.     . insulin glargine (LANTUS) 100 UNIT/ML injection Inject 10 Units into the skin at bedtime.     . Insulin Pen Needle (BD PEN NEEDLE NANO U/F) 32G X 4 MM  MISC     . lamoTRIgine (LAMICTAL) 200 MG tablet Take 1.5 tablets (300 mg total) by mouth 2 (two) times daily. Brand Medically Necessary 270 tablet 3  . levothyroxine (SYNTHROID) 150 MCG tablet Take 150 mcg by mouth daily. Am dosage    . metoprolol succinate (TOPROL XL) 50 MG 24 hr tablet  Take 1 tablet (50 mg total) by mouth 2 (two) times daily. Take with or immediately following a meal. 60 tablet 11  . montelukast (SINGULAIR) 10 MG tablet Take 10 mg by mouth at bedtime.    . pantoprazole (PROTONIX) 40 MG tablet Take 40 mg by mouth daily.    . predniSONE (DELTASONE) 10 MG tablet Take 10 mg by mouth daily as needed. Reported on 06/06/2015    . Probiotic Product (ALIGN) 4 MG CAPS Take 1 capsule by mouth daily.    . sevelamer carbonate (RENVELA) 800 MG tablet Take 800-3,200 mg by mouth See admin instructions. 4  Tablets 3 times daily with meals, and 1 tablet with snacks    . triamcinolone (NASACORT ALLERGY 24HR) 55 MCG/ACT AERO nasal inhaler Place 2 sprays into the nose at bedtime.     No current facility-administered medications for this visit.       REVIEW OF SYSTEMS (Negative unless checked)  Constitutional: [] Weight loss  [] Fever  [] Chills Cardiac: [] Chest pain   [] Chest pressure   [] Palpitations   [] Shortness of breath when laying flat   [] Shortness of breath at rest   [] Shortness of breath with exertion. Vascular:  [] Pain in legs with walking   [] Pain in legs at rest   [] Pain in legs when laying flat   [] Claudication   [] Pain in feet when walking  [] Pain in feet at rest  [] Pain in feet when laying flat   [] History of DVT   [] Phlebitis   [x] Swelling in legs   [] Varicose veins   [] Non-healing ulcers Pulmonary:   [] Uses home oxygen   [] Productive cough   [] Hemoptysis   [] Wheeze  [] COPD   [] Asthma Neurologic:  [] Dizziness  [] Blackouts   [] Seizures   [] History of stroke   [] History of TIA  [] Aphasia   [] Temporary blindness   [] Dysphagia   [] Weakness or numbness in arms    [] Weakness or numbness in legs Musculoskeletal:  [] Arthritis   [] Joint swelling   [] Joint pain   [] Low back pain Hematologic:  [] Easy bruising  [x] Easy bleeding   [] Hypercoagulable state   [x] Anemic  [] Hepatitis Gastrointestinal:  [] Blood in stool   [] Vomiting blood  [] Gastroesophageal reflux/heartburn   [] Abdominal pain Genitourinary:  [x] Chronic kidney disease   [] Difficult urination  [] Frequent urination  [] Burning with urination   [] Hematuria Skin:  [] Rashes   [] Ulcers   [] Wounds Psychological:  [] History of anxiety   []  History of major depression.    Physical Examination  BP 131/82 (BP Location: Right Arm)   Pulse 74   Resp 17   Ht 5\' 5"  (1.651 m)   Wt 92.1 kg (203 lb)   BMI 33.78 kg/m  Gen:  WD/WN, NAD Head: /AT, No temporalis wasting. Ear/Nose/Throat: Hearing grossly intact, nares w/o erythema or drainage, trachea midline Eyes: Conjunctiva clear. Sclera non-icteric Neck: Supple.  No JVD.  Pulmonary:  Good air movement, no use of accessory muscles.  Cardiac: RRR, normal S1, S2 Vascular: Thrill is present and left radiocephalic AV fistula Vessel Right Left  Radial Palpable Palpable                                   Musculoskeletal: M/S 5/5 throughout.  No deformity or atrophy.  Neurologic: Sensation grossly intact in extremities.  Symmetrical.  Speech is fluent.  Psychiatric: Judgment intact, Mood & affect appropriate for pt's clinical situation. Dermatologic: No rashes or ulcers noted.  No cellulitis or  open wounds.       Labs Recent Results (from the past 2160 hour(s))  Fecal occult blood, imunochemical     Status: Abnormal   Collection Time: 10/28/16 12:00 AM  Result Value Ref Range   Fecal Occult Bld Positive (A) Negative  Please Note     Status: None   Collection Time: 10/28/16 12:00 AM  Result Value Ref Range   Please Note: Comment     Comment: The date and/or time of collection was not indicated on the requisition as required by state and  federal law.  The date of receipt of the specimen was used as the collection date if not supplied.     Radiology No results found.    Assessment/Plan End-stage renal disease on hemodialysis [facility]) The patient has end-stage renal disease and is on dialysis. She is currently having issues with her left arm AV fistula. We will obtain a hemodialysis access duplex for further evaluation rather than to go straight to a fistulogram as her last fistulogram 3 months ago was normal. It is possible that her low blood pressure is affecting her flow rates as well at dialysis. We will see her back with her duplex.  Type 2 diabetes mellitus (HCC) blood glucose control important in reducing the progression of atherosclerotic disease. Also, involved in wound healing. On appropriate medications.   Essential (primary) hypertension blood pressure control important in reducing the progression of atherosclerotic disease. On appropriate oral medications.  No problem-specific Assessment & Plan notes found for this encounter.    Leotis Pain, MD  11/07/2016 1:24 PM    This note was created with Dragon medical transcription system.  Any errors from dictation are purely unintentional

## 2016-11-19 ENCOUNTER — Other Ambulatory Visit: Payer: Self-pay | Admitting: Neurology

## 2016-11-19 ENCOUNTER — Telehealth: Payer: Self-pay | Admitting: Neurology

## 2016-11-19 DIAGNOSIS — R4789 Other speech disturbances: Secondary | ICD-10-CM

## 2016-11-19 DIAGNOSIS — R404 Transient alteration of awareness: Secondary | ICD-10-CM

## 2016-11-19 DIAGNOSIS — R270 Ataxia, unspecified: Secondary | ICD-10-CM

## 2016-11-19 MED ORDER — CLONAZEPAM 1 MG PO TABS: 1.0000 mg | ORAL_TABLET | Freq: Every day | ORAL | 5 refills | Status: DC

## 2016-11-19 NOTE — Telephone Encounter (Signed)
Please send the refill to  [facility], [place] [phone] (Phone) [phone] (Fax)    for the clonazePAM (KLONOPIN) 1 MG tablet

## 2016-11-19 NOTE — Telephone Encounter (Signed)
Pt mother calling for a refill of clonazePAM (KLONOPIN) 1 MG tablet she states the pt is completely out of this medication

## 2016-11-21 ENCOUNTER — Ambulatory Visit
Admission: RE | Admit: 2016-11-21 | Discharge: 2016-11-21 | Disposition: A | Discharge status: Home / Self Care | Payer: Medicare Other | Source: Ambulatory Visit | Attending: Nephrology | Admitting: Nephrology

## 2016-11-21 DIAGNOSIS — I120 Hypertensive chronic kidney disease with stage 5 chronic kidney disease or end stage renal disease: Secondary | ICD-10-CM | POA: Insufficient documentation

## 2016-11-21 DIAGNOSIS — Z992 Dependence on renal dialysis: Secondary | ICD-10-CM | POA: Diagnosis not present

## 2016-11-21 DIAGNOSIS — E1122 Type 2 diabetes mellitus with diabetic chronic kidney disease: Secondary | ICD-10-CM | POA: Diagnosis not present

## 2016-11-21 DIAGNOSIS — D631 Anemia in chronic kidney disease: Secondary | ICD-10-CM | POA: Diagnosis present

## 2016-11-21 DIAGNOSIS — N186 End stage renal disease: Secondary | ICD-10-CM | POA: Insufficient documentation

## 2016-11-21 HISTORY — DX: Depression, unspecified: F32.A

## 2016-11-21 HISTORY — DX: Major depressive disorder, single episode, unspecified: F32.9

## 2016-11-21 HISTORY — DX: Anxiety disorder, unspecified: F41.9

## 2016-11-21 LAB — PREPARE RBC (CROSSMATCH)

## 2016-11-21 LAB — GLUCOSE, CAPILLARY
Glucose-Capillary: 71 mg/dL (ref 65–99)
Glucose-Capillary: 189 mg/dL — ABNORMAL HIGH (ref 65–99)

## 2016-11-21 MED ORDER — SODIUM CHLORIDE 0.9 % IV SOLN: Freq: Once | INTRAVENOUS | Status: DC

## 2016-11-21 NOTE — OR Nursing (Signed)
Patient discharged ambulatory after blood transfusion completed. She was accompanied by her father. Feels good, no complaints of fever, rash, itching, sob. Iv site intact and no bruising noted. Provided patient with blood transfusion information.

## 2016-11-21 NOTE — OR Nursing (Signed)
Patient arrived ambulatory to [facility] for blood transfusion. Provided complete lunch upon arrival.  Blood sugar was in low 70's so juices given quickly. Up to void several times since being here. No complaints of fever, pain, rash, bp changes.  No hgb on file in this system. Patient states she had her hgb drawn on Monday (5 days ago) at [facility]. She was informed of the levels yesterday.

## 2016-11-22 LAB — TYPE AND SCREEN
ABO/RH(D): O POS
Antibody Screen: NEGATIVE
Unit division: 0

## 2016-11-22 LAB — BPAM RBC
Blood Product Expiration Date: 201807272359
ISSUE DATE / TIME: 201807201420
Unit Type and Rh: 5100

## 2016-12-03 ENCOUNTER — Ambulatory Visit (INDEPENDENT_AMBULATORY_CARE_PROVIDER_SITE_OTHER): Payer: Medicare Other | Admitting: Neurology

## 2016-12-03 ENCOUNTER — Other Ambulatory Visit: Payer: Self-pay | Admitting: Neurology

## 2016-12-03 ENCOUNTER — Encounter: Payer: Self-pay | Admitting: Neurology

## 2016-12-03 DIAGNOSIS — Z992 Dependence on renal dialysis: Secondary | ICD-10-CM

## 2016-12-03 DIAGNOSIS — R404 Transient alteration of awareness: Secondary | ICD-10-CM | POA: Diagnosis not present

## 2016-12-03 DIAGNOSIS — G40909 Epilepsy, unspecified, not intractable, without status epilepticus: Secondary | ICD-10-CM

## 2016-12-03 DIAGNOSIS — R270 Ataxia, unspecified: Secondary | ICD-10-CM

## 2016-12-03 DIAGNOSIS — R4789 Other speech disturbances: Secondary | ICD-10-CM | POA: Diagnosis not present

## 2016-12-03 DIAGNOSIS — T8619 Other complication of kidney transplant: Secondary | ICD-10-CM | POA: Insufficient documentation

## 2016-12-03 MED ORDER — LAMOTRIGINE 200 MG PO TABS: 200.0000 mg | ORAL_TABLET | Freq: Two times a day (BID) | ORAL | 2 refills | Status: DC

## 2016-12-03 MED ORDER — LAMOTRIGINE 200 MG PO TABS: ORAL_TABLET | ORAL | 3 refills | Status: DC

## 2016-12-03 MED ORDER — ETHOSUXIMIDE 250 MG/5ML PO SOLN: ORAL | 3 refills | Status: DC

## 2016-12-03 MED ORDER — CARBAMAZEPINE ER 100 MG PO TB12: ORAL_TABLET | ORAL | 1 refills | Status: DC

## 2016-12-03 NOTE — Progress Notes (Addendum)
[facility]  SLEEP MEDICINE CLINIC   Provider:  Dr Vaniya Augspurger Referring Provider: / Primary Care Physician:  Idelle Crouch, MD  Recent HA- chief complaint.   HPI:  Nina Johnston is a 56 y.o. female here for a RV :   This  Caucasian  right-handed , single female has been followed in this practice since 2009.  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  She had such spells in August 2010 and September 2011 after carbamazepine was changed to a generic form of Carbatrol her blood levels were lower,  she also developed upper respiratory tract infections and pneumonia in 2011 and was unable for a while to use her CPAP which had been initiated in [place] for the treatment of obstructive sleep apnea. Paroxysmal events with mental status changes had continued,  03/17/2011  and 20 13 some of the spells are described as " automatisms"- continuing behavior while  the patient is staring off and acting without  reflecting not being  responsive to stimulation from the outside . After the spell passes, she became very sleepy each time. In August 2012 she was finally referred to an endocrinologist at [facility] after she had a hypoglycemic seizure and her diabetes medications were reduced, now her blood sugars are running between 150 and 200 in the morning,  fasting.[name] begun working out in 2013 with a Physiological scientist and was able to lose about 30 pounds . She returned to using her CPAP.her Hb A1 C. in October of last year was 6.1- she had no other hypoglycemic events reported since March 2013 . Also in 2013 she had to visit the local ER twice for abdominal spasms and cardiac / chest pain.  negative work up. She was developing renal failure.  She has since been followed for a decreased renal clearance by internal medicine and nephrology. The patient is treated for her spells  and remains on Tegretol. It she will need blood levels for her anti-epiletic medications. Dr. Geryl Councilman is debating to reduce her Klonopin , which may help reduce her fall risk as well. Vit D deficiency- takes supplement.    Note contact at [facility]; transplant. Dr . Talmadge Coventry,  [facility] [place] [phone].   History from 04/16/2016. Nina Johnston is here today for a routine revisit but she did have some healthcare over the last week. She is now on hemodialysis, and experienced bleeding at the AV fistula of her left arm during the last treatment. She notice that the patella was blood drenched but her treatment time was almost over. She also had to be evaluated at the local hospital emergency room for a malignant blood pressure peak. Her systolic blood pressure went up , she had a very high heart rate between 150s and 160 bpm. The ED suspected that she had atrial flutter, and she had hypokalemia. Hgb was 8.2.  on last Monday she was seen by cardiology and told she had sinus tachycardia. Chest Xray as she coughed blood. mammogram was due, normal, and potassium /agnesium was normalized.   Interval history from 12/03/2016. I have pleasure of seeing [name] today who has been on hemodialysis since 18 month ago, she has a left arm AV fistula for  hemodialysis access, her blood pressures have been lower especially in treatment and she was asked not to take hypertension medications prior to treatment. She will take the medication either after or the day before. I have The patient on antiepileptic medications after she had recurrent convulsions but we also found a correlation to hypoglycemia.  She has remained on Tegretol. She continues to take Lamictal. New problem: tachycardia since blood transfusion. Her hemoglobin was  6.9, her pulse rate after transfusion was well in the 120s. Pulse rate addressed today was 120. Please note that the patient had also tachycardia up to rates between 150s and 160 in December 2017. At that time she was diagnosed with hypokalemia, her hemoglobin then was 8.2. She needs to speak to her nephrologist.   Review of Systems: Out of a complete 14 system review, the patient complains of only the following symptoms:   Decreasing renal clearance. Recurrent convulsions due to hypoglycemia.  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  No driving allowed.  Keep on anti epileptic meds.    High fall risk @10  points, none of the points due to  age , 5 from medications.    Dry weight 88 .   Social History   Social History  . Marital status: Single    Spouse name: N/A  . Number of children: 0  . Years of education:  college   Occupational History  . not employed    Social History Main Topics  . Smoking status: Never Smoker  . Smokeless tobacco: Never Used  . Alcohol use No  . Drug use: No  . Sexual activity: Not Currently   Other Topics Concern  . Not on file   Social History Narrative   Patient is single and lives with her parents.   Patient is disabled.   Patient has a college education.   Patient is right- handed.   Patient drinks tea occasionally. 2 glasses of tea when they eat out.    Family History  Problem Relation Age of Onset  . Diabetes Mother   . Lumbar disc disease Mother   . Migraines Mother   . Hypertension Mother   . Cancer - Prostate Father        mild  . Diabetes Father   . Hypertension Father   . Breast cancer Neg Hx   . Ovarian cancer Neg Hx   . Colon cancer Neg Hx   . Heart disease Neg Hx   . Kidney cancer Neg Hx   . Bladder Cancer Neg Hx     Past Medical History:  Diagnosis Date  . Acute bacterial sinusitis 04/19/2014  . Anxiety   . Anxiety and depression   . Asthma   . Ataxia 11/10/2012    Gait 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 obese  female  On multiple psychotropic medications, and with DM>   . Benign essential HTN 12/26/2014  . Bipolar disorder ([place])   . Bronchitis   . Carotid artery narrowing 12/22/2014  . CKD (chronic kidney disease)   . Concussion 01-14-14   Fall   . COPD (chronic obstructive pulmonary disease) ([place])   . Depression   . Diabetes mellitus without complication ([place])   . Dialysis patient ([place])   . Diplopia   . Essential (primary) hypertension 02/12/2015  . Family history of thyroid problem   . GERD (gastroesophageal reflux disease)   . Heart disease    enlarged because of COPD  . Heart murmur   . HLD (hyperlipidemia)   .  Hyperkalemia   . Hypertension   . Hyperthyroidism   . Morbid obesity ([place])   . Multiple thyroid nodules   . OAB (overactive bladder)   . Pernicious anemia   . Post-concussion syndrome 03/08/2014  . Renal disease    w/ GFR 29-may be due to diabetes  . Seizures ([place])    last seizure 2012. has had spells since with no knowledge. last was 1 month ago  . Sleep apnea   . 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 Transient alteration of awareness 11/10/2012  . Urinary incontinence with continuous leakage 03/08/2014    Past Surgical History:  Procedure Laterality Date  . A/V FISTULAGRAM Left 07/21/2016   Procedure: A/V Fistulagram;  Surgeon: Algernon Huxley, MD;  Location: [place] [facility];  Service: Cardiovascular;  Laterality: Left;  . A/V SHUNT INTERVENTION N/A 07/21/2016   Procedure: A/V Shunt Intervention;  Surgeon: Algernon Huxley, MD;  Location: [place] [facility];  Service: Cardiovascular;  Laterality: N/A;  . AVF    . CHOLECYSTECTOMY    . ENDOMETRIAL BIOPSY     ablation, uterine  . HERNIA REPAIR    . HERNIA REPAIR    . PERIPHERAL VASCULAR CATHETERIZATION N/A 08/23/2015   Procedure: Dialysis/Perma Catheter Insertion;  Surgeon: Algernon Huxley, MD;  Location: [place] [facility];  Service: Cardiovascular;  Laterality: N/A;  . PERIPHERAL VASCULAR CATHETERIZATION N/A 11/09/2015    Procedure: Dialysis/Perma Catheter Removal;  Surgeon: Katha Cabal, MD;  Location: [place] [facility];  Service: Cardiovascular;  Laterality: N/A;  . PERIPHERAL VASCULAR CATHETERIZATION Left 04/23/2016   Procedure: A/V Fistulagram;  Surgeon: Algernon Huxley, MD;  Location: [place] [facility];  Service: Cardiovascular;  Laterality: Left;  . PORT A CATH REVISION      Current Outpatient Prescriptions  Medication Sig Dispense Refill  . amLODipine (NORVASC) 5 MG tablet Take 5 mg by mouth 2 (two) times daily.    Marland Kitchen aspirin 81 MG tablet Take 81 mg by mouth daily.     Marland Kitchen b complex vitamins tablet Take 1 tablet by mouth daily.    . carbamazepine (TEGRETOL-XR) 100 MG 12 hr tablet Two tablets in the morning and one tablet in the evening.  Please keep pending appt. on December 03, 2016 in order to receive future refills. 90 tablet 1  . clonazePAM (KLONOPIN) 1 MG tablet Take 1 tablet (1 mg total) by mouth at bedtime. 30 tablet 5  . cyanocobalamin (,VITAMIN B-12,) 1000 MCG/ML injection Inject 1,000 mcg into the muscle every 14 (fourteen) days.     Mariane Baumgarten Calcium (STOOL SOFTENER PO) Take 1 capsule by mouth every morning.     Marland Kitchen ethosuximide (ZARONTIN) 250 MG/5ML solution TAKE 5ML BY MOUTH 2 TIMES DAILY 474 mL 3  . Febuxostat (ULORIC) 80 MG TABS Take 1 tablet by mouth daily. Morning dosage     . fexofenadine (ALLEGRA) 180 MG tablet Take 180 mg by mouth daily. am    . Fluticasone-Salmeterol (ADVAIR DISKUS) 250-50 MCG/DOSE AEPB Inhale 1 puff into the lungs 2 (two) times daily.    . folic acid-vitamin b complex-vitamin c-selenium-zinc (DIALYVITE) 3 MG TABS tablet Take 1 tablet by mouth daily.    . furosemide (LASIX) 20 MG tablet Take 30 mg by mouth 2 (two) times daily.    Marland Kitchen gemfibrozil (LOPID) 600 MG tablet Take 600 mg by mouth 2 (two) times daily before a meal.    . glucose blood (ONE TOUCH ULTRA TEST) test strip Use 4 (  four) times daily. Test 3 - 4 times daily E11.21    . GuaiFENesin (MUCINEX PO) Take 1  tablet by mouth 2 (two) times daily as needed. Expectorant, as needed     . hydrALAZINE (APRESOLINE) 50 MG tablet Take 100 mg by mouth 3 (three) times daily.     . insulin glargine (LANTUS) 100 UNIT/ML injection Inject 26 Units into the skin at bedtime.     . insulin lispro (HUMALOG) 100 UNIT/ML injection Inject 7 Units into the skin 2 (two) times daily with a meal. Sliding scale takes in sugars are over 200-299 2 units, 300-399 3 units, over 400 4 units.     . Insulin Pen Needle (BD PEN NEEDLE NANO U/F) 32G X 4 MM MISC     . lamoTRIgine (LAMICTAL) 200 MG tablet Brand Medically Necessary  Please keep pending appt. on Aug. 1, 2018, in order to receive future refills. 270 tablet 3  . levothyroxine (SYNTHROID) 150 MCG tablet Take 150 mcg by mouth daily before breakfast. Am dosage     . metoprolol succinate (TOPROL XL) 50 MG 24 hr tablet Take 1 tablet (50 mg total) by mouth 2 (two) times daily. Take with or immediately following a meal. (Patient taking differently: Take 75 mg by mouth 2 (two) times daily. Take with or immediately following a meal. ) 60 tablet 11  . montelukast (SINGULAIR) 10 MG tablet Take 10 mg by mouth at bedtime.    Marland Kitchen nystatin-triamcinolone (MYCOLOG II) cream Apply 1 application topically 2 (two) times daily. 30 g 1  . pantoprazole (PROTONIX) 40 MG tablet Take 40 mg by mouth every morning.     . Probiotic Product (ALIGN) 4 MG CAPS Take 1 capsule by mouth daily.    . sevelamer carbonate (RENVELA) 800 MG tablet Take 800-3,200 mg by mouth See admin instructions. 4  Tablets 3 times daily with meals, and 1 tablet with snacks    . triamcinolone (NASACORT ALLERGY 24HR) 55 MCG/ACT AERO nasal inhaler Place 2 sprays into the nose 2 (two) times daily.      No current facility-administered medications for this visit.     Allergies as of 12/03/2016 - Review Complete 12/03/2016  Allergen Reaction Noted  . Cinnamon  11/09/2012  . Procrit [epoetin alfa]  11/10/2012  . Ace inhibitors Cough  09/06/2014  . Azithromycin  09/06/2014  . Iron  03/31/2016  . Neomycin-bacitracin zn-polymyx  09/06/2014  . Sulfa antibiotics  09/06/2014    Vitals: BP 121/60   Pulse 75   Ht 5\' 2"  (1.575 m)   Wt 204 lb (92.5 kg)   BMI 37.31 kg/m  Last Weight:  Wt Readings from Last 1 Encounters:  12/03/16 204 lb (92.5 kg)   Last Height:   Ht Readings from Last 1 Encounters:  12/03/16 5\' 2"  (1.575 m)   Vision Screening:   Physical exam:  General: The patient is awake, alert and appears not in acute distress. The patient is well groomed. Head: Normocephalic, atraumatic. Neck is supple.  Mallampati 1 - there is no uvula , neck circumference:15.5 inches.   On renal low potassium diet, she lost 45 pounds .  Cardiovascular:  Regular rate and rhythm, without  murmurs or carotid bruit, and without distended neck veins. Respiratory: Lungs are clear to auscultation. Skin: ankle  edema, no rash. Looking jaundiced, bronzed .  Dry skin, low TURGOR, appears dehydrated and dusky. Bronzed skin. .  Trunk: BMI is morbidly obese-  Abdominal hernia.  patient has normal posture.  Neurologic exam : The patient is awake and alert, oriented to place and time. Memory subjective described as intact.  There is a normal attention span & concentration ability. Speech is fluent without dysarthria or aphasia. Mood and affect are appropriate. Cranial nerves: Pupils are equal and briskly reactive to light. No disrounded pupils,  Yellowish sclerea.   Visual fields by finger perimetry are intact.Hearing to finger rub intact. Facial sensation intact to fine touch. Facial motor strength is symmetric and tongue moves midline. Motor exam:  Normal tone and normal muscle bulk, obesity related attenuated DTR , and symmetric normal strength in all extremities. She has an AV access fistula on the left arm. Thrill.  Sensory:  Fine touch, pinprick and vibration were tested in all extremities and are presents in toes and feet.  Proprioception is tested and normal. Coordination: Rapid alternating movements in the fingers/hands is tested and normal. Finger-to-nose maneuver tested and normal without evidence of ataxia, dysmetria or tremor. Gait and station: Patient walks without assistive device and is very slow - Appears truncally rigid, with no lumbar rotation, turns with 5 steps, and has reduced step width. Strength within normal limits. Stance is stable and normal. Tandem gait is fragmented. She waddels.  Deep tendon reflexes: in the upper and lower extremities are symmetric and intact.  Babinski maneuver response is down going on the left and equivocal on the right.  Assessment:    Declining overall health, obesity, CKD, on hemodialysis.  On renal transplant waiting list.  AV fistula- anemia, tachycardia.      Plan:   See labs.   Rv every 6 month  Refills for Lamictal- stays bid , Tegretol will be taken right after HD on dialysis days, and one at night.  Ethosuximide can be taken twice a day as her hemodialysis does not begin with and less than 6 hours after intake time.  ,  Klonopin  . She has seen PCP Dr. Fulton Reek, [facility].  Dr Holley Raring, Nephrologist- on hemodialysis. Oligouric. I will kindly ask Dr. Holley Raring to draw 1 hemoglobin-hematocrit after hemodialysis to see if there is a ldilutional variance. CD   Larey Seat, MD

## 2016-12-03 NOTE — Patient Instructions (Signed)
Dialysis Diet  Dialysis is a treatment that cleans your blood. It is used when the kidneys are damaged. When you need dialysis, you should watch your diet. This is because some nutrients can build up in your blood between treatments and make you sick. These nutrients are:   Potassium.   Phosphorus.   Sodium.    Your doctor or dietitian will:   Tell you how much of these you can have.   Tell you if you need to look out for other nutrients too.   Help you plan meals.   Tell you how much to drink each day.    What do I need to know about this diet?   Limit potassium. Potassium is in milk, fruits, and vegetables.   Limit phosphorus. Phosphorus is in milk, cheese, beans, nuts, and carbonated beverages.   Limit salt (sodium). Foods that have a lot sodium include processed and cured meats, ready-made frozen meals, canned vegetables, and salty snack foods.   Do not use salt substitutes.   Try not to eat whole-grain foods and foods that have a lot of fiber.   Follow your doctor's instructions about how much to drink. You may be told to:  ? Write down what you drink.  ? Write down foods you eat that are made mostly from water, such as gelatin and soups.  ? Drink from small cups.   Ask your doctor if you should take a medicine that binds phosphorus.   Take vitamin and mineral supplements only as told by your doctor.   Eat meat, poultry, fish, and eggs. Limit nuts and beans.   Before you cook potatoes, cut them into small pieces. Then boil them in unsalted water.   Drain all fluid from cooked vegetables and canned fruits before you eat them.  What foods can I eat?  Grains  White bread. White rice. Cooked cereal. Unsalted popcorn. Tortillas. Pasta.  Vegetables  Fresh or frozen broccoli, carrots, and green beans. Cabbage. Cauliflower. Celery. Cucumbers. Eggplant. Radishes. Zucchini.  Fruits  Apples. Fresh or frozen berries. Fresh or canned pears, peaches, and pineapple. Grapes. Plums.  Meats and Other Protein  Sources  Fresh or frozen beef, pork, chicken, and fish. Eggs. Low-sodium canned tuna or salmon.  Dairy  Cream cheese. Heavy cream. Ricotta cheese.  Beverages  Apple cider. Cranberry juice. Grape juice. Lemonade. Black coffee.  Condiments  Herbs. Spices. Jam and jelly. Honey.  Sweets and Desserts  Sherbet. Cakes. Cookies.  Fats and Oils  Olive oil, canola oil, and safflower oil.  Other  Non-dairy creamer. Non-dairy whipped topping. Homemade broth without salt.  The items listed above may not be a complete list of recommended foods or beverages. Contact your dietitian for more options.  What foods are not recommended?  Grains  Whole-grain bread. Whole-grain pasta. High-fiber cereal.  Vegetables  Potatoes. Beets. Tomatoes. Winter squash and pumpkin. Asparagus. Spinach. Parsnips.  Fruits  Star fruit. Bananas. Oranges. Kiwi. Nectarines. Prunes. Melon. Dried fruit. Avocado.  Meats and Other Protein Sources  Canned, smoked, and cured meats. Packaged luncheon meat. Sardines. Nuts and seeds. Peanut butter. Beans and legumes.  Dairy  Milk. Buttermilk. Yogurt. Cheese and cottage cheese. Processed cheese spreads.  Beverages  Orange juice. Prune juice. Carbonated soft drinks.  Condiments  Salt. Salt substitutes. Soy sauce.  Sweets and Desserts  Ice cream. Chocolate. Candied nuts.  Fats and Oils  Butter. Margarine.  Other  Ready-made frozen meals. Canned soups.  The items listed above may not be a 

## 2016-12-11 ENCOUNTER — Telehealth: Payer: Self-pay | Admitting: Neurology

## 2016-12-11 NOTE — Telephone Encounter (Signed)
Patients mother called office in reference to office visit with Dr. Brett Fairy on 12/03/16 and while driving back home to [facility] patient "had a spell" lasting 15 minutes per mother.  Patient went to see her endocrinologist at [facility] today and the Dr was advised of the spell and advised patient to contact our office to speak with Dr. Brett Fairy to see if she feels the patient should not drive for a period of time.  Mom said these spells some and go.  Please contact mother either on home phone listed in contact info or cell phone number [phone].

## 2016-12-12 ENCOUNTER — Ambulatory Visit (INDEPENDENT_AMBULATORY_CARE_PROVIDER_SITE_OTHER): Payer: Medicare Other

## 2016-12-12 ENCOUNTER — Encounter (INDEPENDENT_AMBULATORY_CARE_PROVIDER_SITE_OTHER): Payer: Self-pay | Admitting: Vascular Surgery

## 2016-12-12 ENCOUNTER — Ambulatory Visit (INDEPENDENT_AMBULATORY_CARE_PROVIDER_SITE_OTHER): Payer: Medicare Other | Admitting: Vascular Surgery

## 2016-12-12 VITALS — BP 123/58 | HR 78 | Resp 16 | Wt 210.0 lb

## 2016-12-12 DIAGNOSIS — I10 Essential (primary) hypertension: Secondary | ICD-10-CM

## 2016-12-12 DIAGNOSIS — E118 Type 2 diabetes mellitus with unspecified complications: Secondary | ICD-10-CM

## 2016-12-12 DIAGNOSIS — Z992 Dependence on renal dialysis: Secondary | ICD-10-CM

## 2016-12-12 DIAGNOSIS — N186 End stage renal disease: Secondary | ICD-10-CM

## 2016-12-12 NOTE — Progress Notes (Signed)
MRN : [id number]  Nina Johnston is a 56 y.o. (04-May-1961) female who presents with chief complaint of  Chief Complaint  Patient presents with  . ultrasound follow up  .  History of Present Illness: Patient returns today in follow up of her dialysis access. They have had more issues with low flow and recirculation dialysis. He does not have prolonged bleeding or pain with dialysis. She has had previous interventions to the access. She denies shortness of breath or chest pain. No fever or chills. Her noninvasive studies today demonstrate elevated velocities at the anastomosis but what appeared to be a culprit lesion in the radial artery proximal to the anastomosis limiting the inflow. The remainder of the AV fistula did not have obvious stenosis.  Current Outpatient Prescriptions  Medication Sig Dispense Refill  . amLODipine (NORVASC) 5 MG tablet Take 5 mg by mouth daily.     Marland Kitchen aspirin 81 MG tablet Take 81 mg by mouth daily.     Marland Kitchen b complex vitamins tablet Take 1 tablet by mouth daily.    . carbamazepine (TEGRETOL-XR) 100 MG 12 hr tablet Two tablets in the morning and one tablet in the evening.  Please keep pending appt. on December 03, 2016 in order to receive future refills. 90 tablet 1  . clonazePAM (KLONOPIN) 1 MG tablet Take 1 tablet (1 mg total) by mouth at bedtime. 30 tablet 5  . cyanocobalamin (,VITAMIN B-12,) 1000 MCG/ML injection Inject 1,000 mcg into the muscle every 14 (fourteen) days.     Mariane Baumgarten Calcium (STOOL SOFTENER PO) Take 1 capsule by mouth every morning.     Marland Kitchen ethosuximide (ZARONTIN) 250 MG/5ML solution TAKE 5ML BY MOUTH 2 TIMES DAILY 474 mL 3  . Febuxostat (ULORIC) 80 MG TABS Take 1 tablet by mouth daily. Morning dosage     . fexofenadine (ALLEGRA) 180 MG tablet Take 180 mg by mouth daily. am    . Fluticasone-Salmeterol (ADVAIR DISKUS) 250-50 MCG/DOSE AEPB Inhale 1 puff into the lungs 2 (two) times daily.    . folic acid-vitamin b complex-vitamin c-selenium-zinc  (DIALYVITE) 3 MG TABS tablet Take 1 tablet by mouth daily.    . furosemide (LASIX) 20 MG tablet Take 30 mg by mouth 2 (two) times daily.    Marland Kitchen gemfibrozil (LOPID) 600 MG tablet Take 600 mg by mouth 2 (two) times daily before a meal.    . glucose blood (ONE TOUCH ULTRA TEST) test strip Use 4 (four) times daily. Test 3 - 4 times daily E11.21    . GuaiFENesin (MUCINEX PO) Take 1 tablet by mouth 2 (two) times daily as needed. Expectorant, as needed     . hydrALAZINE (APRESOLINE) 50 MG tablet Take 100 mg by mouth 3 (three) times daily.     . insulin glargine (LANTUS) 100 UNIT/ML injection Inject 26 Units into the skin at bedtime.     . insulin lispro (HUMALOG) 100 UNIT/ML injection Inject 7 Units into the skin 2 (two) times daily with a meal. Sliding scale takes in sugars are over 200-299 2 units, 300-399 3 units, over 400 4 units.     . Insulin Pen Needle (BD PEN NEEDLE NANO U/F) 32G X 4 MM MISC     . lamoTRIgine (LAMICTAL) 200 MG tablet Take 1 tablet (200 mg total) by mouth 2 (two) times daily. 180 tablet 2  . levothyroxine (SYNTHROID) 150 MCG tablet Take 150 mcg by mouth daily before breakfast. Am dosage     . metoprolol  succinate (TOPROL XL) 50 MG 24 hr tablet Take 1 tablet (50 mg total) by mouth 2 (two) times daily. Take with or immediately following a meal. (Patient taking differently: Take 75 mg by mouth 2 (two) times daily. Take with or immediately following a meal. ) 60 tablet 11  . montelukast (SINGULAIR) 10 MG tablet Take 10 mg by mouth at bedtime.    Marland Kitchen nystatin-triamcinolone (MYCOLOG II) cream Apply 1 application topically 2 (two) times daily. 30 g 1  . pantoprazole (PROTONIX) 40 MG tablet Take 40 mg by mouth every morning.     . Probiotic Product (ALIGN) 4 MG CAPS Take 1 capsule by mouth daily.    . sevelamer carbonate (RENVELA) 800 MG tablet Take 800-3,200 mg by mouth See admin instructions. 4  Tablets 3 times daily with meals, and 1 tablet with snacks    . triamcinolone (NASACORT ALLERGY  24HR) 55 MCG/ACT AERO nasal inhaler Place 2 sprays into the nose 2 (two) times daily.      No current facility-administered medications for this visit.     Past Medical History:  Diagnosis Date  . Acute bacterial sinusitis 04/19/2014  . Anxiety   . Anxiety and depression   . Asthma   . 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 Benign essential HTN 12/26/2014  . Bipolar disorder ([place])   . Bronchitis   . Carotid artery narrowing 12/22/2014  . CKD (chronic kidney disease)   . Concussion 01-14-14   Fall   . COPD (chronic obstructive pulmonary disease) ([place])   . Depression   . Diabetes mellitus without complication ([place])   . Dialysis patient ([place])   . Diplopia   . Essential (primary) hypertension 02/12/2015  . Family history of thyroid problem   . GERD (gastroesophageal reflux disease)   . Heart disease    enlarged because of COPD  . Heart murmur   . HLD (hyperlipidemia)   . Hyperkalemia   . Hypertension   . Hyperthyroidism   . Morbid obesity ([place])   . Multiple thyroid nodules   . OAB (overactive bladder)   . Pernicious anemia   . Post-concussion syndrome 03/08/2014  . Renal disease    w/ GFR 29-may be due to diabetes  . Seizures ([place])    last seizure 2012. has had spells since with no knowledge. last was 1 month ago  . Sleep apnea   . 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 Transient alteration of awareness 11/10/2012  . Urinary incontinence with continuous leakage 03/08/2014    Past Surgical History:  Procedure Laterality Date  . A/V FISTULAGRAM Left 07/21/2016   Procedure: A/V Fistulagram;  Surgeon: Algernon Huxley, MD;  Location: [facility];  Service: Cardiovascular;  Laterality: Left;  . A/V SHUNT INTERVENTION N/A 07/21/2016   Procedure: A/V Shunt Intervention;  Surgeon: Algernon Huxley, MD;  Location: [facility];  Service: Cardiovascular;  Laterality: N/A;  . AVF    . CHOLECYSTECTOMY    .  ENDOMETRIAL BIOPSY     ablation, uterine  . HERNIA REPAIR    . HERNIA REPAIR    . PERIPHERAL VASCULAR CATHETERIZATION N/A 08/23/2015   Procedure: Dialysis/Perma Catheter Insertion;  Surgeon: Algernon Huxley, MD;  Location: [facility];  Service: Cardiovascular;  Laterality: N/A;  . PERIPHERAL VASCULAR CATHETERIZATION N/A 11/09/2015   Procedure: Dialysis/Perma Catheter Removal;  Surgeon: Katha Cabal, MD;  Location: [facility];  Service: Cardiovascular;  Laterality: N/A;  . PERIPHERAL VASCULAR CATHETERIZATION Left 04/23/2016   Procedure: A/V Fistulagram;  Surgeon: Algernon Huxley, MD;  Location: [facility];  Service: Cardiovascular;  Laterality: Left;  . PORT A CATH REVISION            Family History  Problem Relation Age of Onset  . Diabetes Mother   . Lumbar disc disease Mother   . Migraines Mother   . Hypertension Mother   . Cancer - Prostate Father     mild  . Diabetes Father   . Hypertension Father   . Breast cancer Neg Hx   . Ovarian cancer Neg Hx   . Colon cancer Neg Hx   . Heart disease Neg Hx      Social History     Social History  Substance Use Topics  . Smoking status: Never Smoker  . Smokeless tobacco: Never Used  . Alcohol use No  No IV drug use       Allergies  Allergen Reactions  . Cinnamon     Other reaction(s): Unknown  . Procrit [Epoetin Alfa]     Other reaction(s): Other (See Comments) Seizures, grand mal Seizures, grand mal  . Ace Inhibitors Cough  . Azithromycin     Other reaction(s): Other (See Comments) seizures  . Iron     Can not take in IV Form - causes seizures   . Neomycin-Bacitracin Zn-Polymyx     Other reaction(s): Unknown  . Sulfa Antibiotics     Other reaction(s): Other (See Comments) Due to risks for seizures.        REVIEW OF SYSTEMS(Negative unless checked)  Constitutional: [] Weight loss[] Fever[] Chills Cardiac:[] Chest pain[] Chest  pressure[] Palpitations [] Shortness of breath when laying flat [] Shortness of breath at rest [] Shortness of breath with exertion. Vascular: [] Pain in legs with walking[] Pain in legsat rest[] Pain in legs when laying flat [] Claudication [] Pain in feet when walking [] Pain in feet at rest [] Pain in feet when laying flat [] History of DVT [] Phlebitis [x] Swelling in legs [] Varicose veins [] Non-healing ulcers Pulmonary: [] Uses home oxygen [] Productive cough[] Hemoptysis [] Wheeze [] COPD [] Asthma Neurologic: [] Dizziness [] Blackouts [] Seizures [] History of stroke [] History of TIA[] Aphasia [] Temporary blindness[] Dysphagia [] Weaknessor numbness in arms [] Weakness or numbnessin legs Musculoskeletal: [] Arthritis [] Joint swelling [] Joint pain [] Low back pain Hematologic:[] Easy bruising[x] Easy bleeding [] Hypercoagulable state [x] Anemic [] Hepatitis Gastrointestinal:[] Blood in stool[] Vomiting blood[] Gastroesophageal reflux/heartburn[] Abdominal pain Genitourinary: [x] Chronic kidney disease [] Difficulturination [] Frequenturination [] Burning with urination[] Hematuria Skin: [] Rashes [] Ulcers [] Wounds Psychological: [] History of anxiety[] History of major depression.    Physical Examination  BP (!) 123/58   Pulse 78   Resp 16   Wt 210 lb (95.3 kg)   BMI 38.41 kg/m  Gen:  WD/WN, NAD Head: [place]/AT, No temporalis wasting. Ear/Nose/Throat: Hearing grossly intact, nares w/o erythema or drainage, trachea midline Eyes: Conjunctiva clear. Sclera non-icteric Neck: Supple.  No JVD.  Pulmonary:  Good air movement, no use of accessory muscles.  Cardiac: RRR, normal S1, S2 Vascular: weak thrill is present in the left radiocephalic AVF Vessel Right Left  Radial Palpable Palpable                                    Musculoskeletal: M/S 5/5 throughout.  No deformity or atrophy.  Neurologic: Sensation grossly  intact in extremities.  Symmetrical.  Speech is fluent.  Psychiatric: Judgment and insight are poor.  Anxious affect. Dermatologic: No rashes or ulcers noted.  No cellulitis or  open wounds.       Labs Recent Results (from the past 2160 hour(s))  Fecal occult blood, imunochemical     Status: Abnormal   Collection Time: 10/28/16 12:00 AM  Result Value Ref Range   Fecal Occult Bld Positive (A) Negative  Please Note     Status: None   Collection Time: 10/28/16 12:00 AM  Result Value Ref Range   Please Note: Comment     Comment: The date and/or time of collection was not indicated on the requisition as required by state and federal law.  The date of receipt of the specimen was used as the collection date if not supplied.   Type and screen [place]     Status: None   Collection Time: 11/21/16 12:12 PM  Result Value Ref Range   ABO/RH(D) O POS    Antibody Screen NEG    Sample Expiration 11/24/2016    Unit Number [id number]    Blood Component Type RBC LR PHER2    Unit division 00    Status of Unit ISSUED,FINAL    Transfusion Status OK TO TRANSFUSE    Crossmatch Result Compatible   BPAM RBC     Status: None   Collection Time: 11/21/16 12:12 PM  Result Value Ref Range   ISSUE DATE / TIME [id number]    Blood Product Unit Number [id number]    PRODUCT CODE [id number]    Unit Type and Rh 5100    Blood Product Expiration Date [id number]   Prepare RBC     Status: None   Collection Time: 11/21/16 12:30 PM  Result Value Ref Range   Order Confirmation ORDER PROCESSED BY BLOOD BANK   Glucose, capillary     Status: None   Collection Time: 11/21/16 12:32 PM  Result Value Ref Range   Glucose-Capillary 71 65 - 99 mg/dL  Glucose, capillary     Status: Abnormal   Collection Time: 11/21/16  3:32 PM  Result Value Ref Range   Glucose-Capillary 189 (H) 65 - 99 mg/dL    Radiology No results found.   Assessment/Plan Type 2 diabetes mellitus (HCC) blood  glucose control important in reducing the progression of atherosclerotic disease. Also, involved in wound healing. On appropriate medications.   Essential (primary) hypertension blood pressure control important in reducing the progression of atherosclerotic disease. On appropriate oral medications.  End-stage renal disease on hemodialysis ([place]) Her noninvasive studies today demonstrate elevated velocities at the anastomosis but what appeared to be a culprit lesion in the radial artery proximal to the anastomosis limiting the inflow. The remainder of the AV fistula did not have obvious stenosis. Given these findings, left upper extremity fistulogram with a plan to advance the catheter into the radial artery and evaluate her arterial inflow and possible intervention to address that would be reasonable. I discussed the risks benefits the procedure. The patient and her parents are agreeable to proceed with our plan of care.    Leotis Pain, MD  12/12/2016 5:13 PM    This note was created with Dragon medical transcription system.  Any errors from dictation are purely unintentional

## 2016-12-12 NOTE — Patient Instructions (Signed)
Angiogram  An angiogram, also called angiography, is a procedure used to look at the blood vessels. In this procedure, dye is injected through a long, thin tube (catheter) into an artery. X-rays are then taken. The X-rays will show if there is a blockage or problem in a blood vessel.  Tell a health care provider about:   Any allergies you have, including allergies to shellfish or contrast dye.   All medicines you are taking, including vitamins, herbs, eye drops, creams, and over-the-counter medicines.   Any problems you or family members have had with anesthetic medicines.   Any blood disorders you have.   Any surgeries you have had.   Any previous kidney problems or failure you have had.   Any medical conditions you have.   Possibility of pregnancy, if this applies.  What are the risks?  Generally, an angiogram is a safe procedure. However, as with any procedure, problems can occur. Possible problems include:   Injury to the blood vessels, including rupture or bleeding.   Infection or bruising at the catheter site.   Allergic reaction to the dye or contrast used.   Kidney damage from the dye or contrast used.   Blood clots that can lead to a stroke or heart attack.    What happens before the procedure?   Do not eat or drink after midnight on the night before the procedure, or as directed by your health care provider.   Ask your health care provider if you may drink enough water to take any needed medicines the morning of the procedure.  What happens during the procedure?   You may be given a medicine to help you relax (sedative) before and during the procedure. This medicine is given through an IV access tube that is inserted into one of your veins.   The area where the catheter will be inserted will be washed and shaved. This is usually done in the groin but may be done in the fold of your arm (near your elbow) or in the wrist.   A medicine will be given to numb the area where the catheter will  be inserted (local anesthetic).   The catheter will be inserted with a guide wire into an artery. The catheter is guided by using a type of X-ray (fluoroscopy) to the blood vessel being examined.   Dye is then injected into the catheter, and X-rays are taken. The dye helps to show where any narrowing or blockages are located.  What happens after the procedure?   If the procedure is done through the leg, you will be kept in bed lying flat for several hours. You will be instructed to not bend or cross your legs.   The insertion site will be checked frequently.   The pulse in your feet or wrist will be checked frequently.   Additional blood tests, X-rays, and electrocardiography may be done.   You may need to stay in the hospital overnight for observation.  This information is not intended to replace advice given to you by your health care provider. Make sure you discuss any questions you have with your health care provider.  Document Released: 01/29/2005 Document Revised: 10/03/2015 Document Reviewed: 09/22/2012  Elsevier Interactive Patient Education  2017 [employer]

## 2016-12-12 NOTE — Assessment & Plan Note (Signed)
Her noninvasive studies today demonstrate elevated velocities at the anastomosis but what appeared to be a culprit lesion in the radial artery proximal to the anastomosis limiting the inflow. The remainder of the AV fistula did not have obvious stenosis. Given these findings, left upper extremity fistulogram with a plan to advance the catheter into the radial artery and evaluate her arterial inflow and possible intervention to address that would be reasonable. I discussed the risks benefits the procedure. The patient and her parents are agreeable to proceed with our plan of care.

## 2016-12-15 ENCOUNTER — Other Ambulatory Visit (INDEPENDENT_AMBULATORY_CARE_PROVIDER_SITE_OTHER): Payer: Self-pay

## 2016-12-15 ENCOUNTER — Telehealth: Payer: Self-pay | Admitting: Neurology

## 2016-12-15 NOTE — Telephone Encounter (Signed)
Pt called the clinic today, she was not aware Dr Brett Fairy was out of the office last Thursday and Friday. She also wanted to add she needs an order for water aerobics and she is wanting to go back to driving. Please call to discuss. Pt is requesting a call back today.

## 2016-12-16 NOTE — Telephone Encounter (Signed)
I spoke to cindy and mother Publishing copy ) and explained that she cannot drive for the next 6 month,  her mother described the spell in detail, it sounded much like a right brain origin seizure with lip smacking automatisms and she needed 15 minutes to come back to full awareness.   Aerobics in shallow pool with a group supervisor are safe.  Larey Seat, MD

## 2016-12-16 NOTE — Telephone Encounter (Signed)
Called and spoke with patient to discuss driving. At this time Dr Brett Fairy would rather her not drive if she is having spells. Pt stated it was just the one time and she would really like to be able to drive and asked at what point can she start back driving. I told her I would defer that questions to Dr Brett Fairy and would get back to her with an answer. Pt then asked for a letter for her water aerobics instructor. Dr Dohmeier is ok with her continuing to do water aerobics so I will make a letter and put in mail. Pt verbalized understanding.

## 2016-12-19 ENCOUNTER — Encounter
Admission: RE | Admit: 2016-12-19 | Discharge: 2016-12-19 | Disposition: A | Discharge status: Home / Self Care | Payer: Medicare Other | Source: Ambulatory Visit | Attending: Vascular Surgery | Admitting: Vascular Surgery

## 2016-12-19 DIAGNOSIS — Z01812 Encounter for preprocedural laboratory examination: Secondary | ICD-10-CM | POA: Diagnosis not present

## 2016-12-19 DIAGNOSIS — E11649 Type 2 diabetes mellitus with hypoglycemia without coma: Secondary | ICD-10-CM | POA: Insufficient documentation

## 2016-12-19 DIAGNOSIS — E1122 Type 2 diabetes mellitus with diabetic chronic kidney disease: Secondary | ICD-10-CM | POA: Diagnosis not present

## 2016-12-19 DIAGNOSIS — N184 Chronic kidney disease, stage 4 (severe): Secondary | ICD-10-CM | POA: Insufficient documentation

## 2016-12-19 DIAGNOSIS — I120 Hypertensive chronic kidney disease with stage 5 chronic kidney disease or end stage renal disease: Secondary | ICD-10-CM | POA: Insufficient documentation

## 2016-12-19 DIAGNOSIS — E161 Other hypoglycemia: Secondary | ICD-10-CM | POA: Insufficient documentation

## 2016-12-19 DIAGNOSIS — N3945 Continuous leakage: Secondary | ICD-10-CM | POA: Insufficient documentation

## 2016-12-19 LAB — BASIC METABOLIC PANEL
Anion gap: 14 (ref 5–15)
BUN: 41 mg/dL — ABNORMAL HIGH (ref 6–20)
CO2: 30 mmol/L (ref 22–32)
Calcium: 8.8 mg/dL — ABNORMAL LOW (ref 8.9–10.3)
Chloride: 100 mmol/L — ABNORMAL LOW (ref 101–111)
Creatinine, Ser: 4.29 mg/dL — ABNORMAL HIGH (ref 0.44–1.00)
GFR calc Af Amer: 12 mL/min — ABNORMAL LOW (ref >60)
GFR calc non Af Amer: 11 mL/min — ABNORMAL LOW (ref >60)
Glucose, Bld: 129 mg/dL — ABNORMAL HIGH (ref 65–99)
Potassium: 4.1 mmol/L (ref 3.5–5.1)
Sodium: 144 mmol/L (ref 135–145)

## 2016-12-19 NOTE — Pre-Procedure Instructions (Signed)
ECG 12-lead6/10/2016 [place] Component Name Value Ref Range  Vent Rate (bpm) 84   PR Interval (msec) 172   QRS Interval (msec) 108   QT Interval (msec) 386   QTc (msec) 456   Result Narrative  Normal sinus rhythm Possible Left atrial enlargement Cannot rule out Anterior infarct , age undetermined Abnormal ECG When compared with ECG of 14-Apr-2016 15:51, Non-specific change in ST segment in Lateral leads QT has shortened I reviewed and concur with this report. Electronically signed [id number] MD, Darnell Level [phone]) on 10/16/2016 12:54:57 PM

## 2016-12-23 MED ORDER — CEFAZOLIN SODIUM-DEXTROSE 1-4 GM/50ML-% IV SOLN: 1.0000 g | Freq: Once | INTRAVENOUS | Status: DC

## 2016-12-24 ENCOUNTER — Encounter
Admission: RE | Disposition: A | Discharge status: Home / Self Care | Payer: Self-pay | Source: Ambulatory Visit | Attending: Vascular Surgery

## 2016-12-24 ENCOUNTER — Ambulatory Visit
Admission: RE | Admit: 2016-12-24 | Discharge: 2016-12-24 | Disposition: A | Discharge status: Home / Self Care | Payer: Medicare Other | Source: Ambulatory Visit | Attending: Vascular Surgery | Admitting: Vascular Surgery

## 2016-12-24 DIAGNOSIS — Z882 Allergy status to sulfonamides status: Secondary | ICD-10-CM | POA: Diagnosis not present

## 2016-12-24 DIAGNOSIS — Z8489 Family history of other specified conditions: Secondary | ICD-10-CM | POA: Insufficient documentation

## 2016-12-24 DIAGNOSIS — E785 Hyperlipidemia, unspecified: Secondary | ICD-10-CM | POA: Diagnosis not present

## 2016-12-24 DIAGNOSIS — Z888 Allergy status to other drugs, medicaments and biological substances status: Secondary | ICD-10-CM | POA: Diagnosis not present

## 2016-12-24 DIAGNOSIS — Z91018 Allergy to other foods: Secondary | ICD-10-CM | POA: Diagnosis not present

## 2016-12-24 DIAGNOSIS — F329 Major depressive disorder, single episode, unspecified: Secondary | ICD-10-CM | POA: Insufficient documentation

## 2016-12-24 DIAGNOSIS — Z6838 Body mass index (BMI) 38.0-38.9, adult: Secondary | ICD-10-CM | POA: Diagnosis not present

## 2016-12-24 DIAGNOSIS — N3281 Overactive bladder: Secondary | ICD-10-CM | POA: Diagnosis not present

## 2016-12-24 DIAGNOSIS — E1122 Type 2 diabetes mellitus with diabetic chronic kidney disease: Secondary | ICD-10-CM | POA: Diagnosis not present

## 2016-12-24 DIAGNOSIS — T82858A Stenosis of vascular prosthetic devices, implants and grafts, initial encounter: Secondary | ICD-10-CM | POA: Insufficient documentation

## 2016-12-24 DIAGNOSIS — N186 End stage renal disease: Secondary | ICD-10-CM | POA: Diagnosis not present

## 2016-12-24 DIAGNOSIS — K219 Gastro-esophageal reflux disease without esophagitis: Secondary | ICD-10-CM | POA: Diagnosis not present

## 2016-12-24 DIAGNOSIS — E875 Hyperkalemia: Secondary | ICD-10-CM | POA: Insufficient documentation

## 2016-12-24 DIAGNOSIS — Z833 Family history of diabetes mellitus: Secondary | ICD-10-CM | POA: Insufficient documentation

## 2016-12-24 DIAGNOSIS — E0590 Thyrotoxicosis, unspecified without thyrotoxic crisis or storm: Secondary | ICD-10-CM | POA: Insufficient documentation

## 2016-12-24 DIAGNOSIS — Z992 Dependence on renal dialysis: Secondary | ICD-10-CM | POA: Insufficient documentation

## 2016-12-24 DIAGNOSIS — I132 Hypertensive heart and chronic kidney disease with heart failure and with stage 5 chronic kidney disease, or end stage renal disease: Secondary | ICD-10-CM | POA: Diagnosis not present

## 2016-12-24 DIAGNOSIS — Z9049 Acquired absence of other specified parts of digestive tract: Secondary | ICD-10-CM | POA: Insufficient documentation

## 2016-12-24 DIAGNOSIS — Z881 Allergy status to other antibiotic agents status: Secondary | ICD-10-CM | POA: Insufficient documentation

## 2016-12-24 DIAGNOSIS — F419 Anxiety disorder, unspecified: Secondary | ICD-10-CM | POA: Diagnosis not present

## 2016-12-24 DIAGNOSIS — G4730 Sleep apnea, unspecified: Secondary | ICD-10-CM | POA: Insufficient documentation

## 2016-12-24 DIAGNOSIS — T82868A Thrombosis of vascular prosthetic devices, implants and grafts, initial encounter: Secondary | ICD-10-CM

## 2016-12-24 DIAGNOSIS — Y832 Surgical operation with anastomosis, bypass or graft as the cause of abnormal reaction of the patient, or of later complication, without mention of misadventure at the time of the procedure: Secondary | ICD-10-CM | POA: Insufficient documentation

## 2016-12-24 DIAGNOSIS — J449 Chronic obstructive pulmonary disease, unspecified: Secondary | ICD-10-CM | POA: Insufficient documentation

## 2016-12-24 DIAGNOSIS — Z9889 Other specified postprocedural states: Secondary | ICD-10-CM | POA: Diagnosis not present

## 2016-12-24 DIAGNOSIS — Z794 Long term (current) use of insulin: Secondary | ICD-10-CM | POA: Diagnosis not present

## 2016-12-24 DIAGNOSIS — Z7982 Long term (current) use of aspirin: Secondary | ICD-10-CM | POA: Diagnosis not present

## 2016-12-24 DIAGNOSIS — Z8042 Family history of malignant neoplasm of prostate: Secondary | ICD-10-CM | POA: Insufficient documentation

## 2016-12-24 DIAGNOSIS — Z8249 Family history of ischemic heart disease and other diseases of the circulatory system: Secondary | ICD-10-CM | POA: Insufficient documentation

## 2016-12-24 DIAGNOSIS — I708 Atherosclerosis of other arteries: Secondary | ICD-10-CM

## 2016-12-24 HISTORY — PX: A/V SHUNT INTERVENTION: CATH118220

## 2016-12-24 HISTORY — PX: A/V FISTULAGRAM: CATH118298

## 2016-12-24 LAB — POTASSIUM (ARMC VASCULAR LAB ONLY): Potassium (ARMC vascular lab): 4.5 (ref 3.5–5.1)

## 2016-12-24 LAB — GLUCOSE, CAPILLARY
Glucose-Capillary: 119 mg/dL — ABNORMAL HIGH (ref 65–99)
Glucose-Capillary: 118 mg/dL — ABNORMAL HIGH (ref 65–99)

## 2016-12-24 SURGERY — A/V FISTULAGRAM
Anesthesia: Moderate Sedation

## 2016-12-24 MED ORDER — IOPAMIDOL (ISOVUE-300) INJECTION 61%
INTRAVENOUS | Status: DC | PRN
Start: 2016-12-24 — End: 2016-12-24
  Administered 2016-12-24: 35 mL via INTRA_ARTERIAL

## 2016-12-24 MED ORDER — FENTANYL CITRATE (PF) 100 MCG/2ML IJ SOLN
INTRAMUSCULAR | Status: AC
  Filled 2016-12-24: qty 2

## 2016-12-24 MED ORDER — FENTANYL CITRATE (PF) 100 MCG/2ML IJ SOLN
INTRAMUSCULAR | Status: DC | PRN
  Administered 2016-12-24: 50 ug via INTRAVENOUS
  Administered 2016-12-24: 25 ug via INTRAVENOUS

## 2016-12-24 MED ORDER — HEPARIN SODIUM (PORCINE) 1000 UNIT/ML IJ SOLN
INTRAMUSCULAR | Status: AC
  Filled 2016-12-24: qty 1

## 2016-12-24 MED ORDER — MIDAZOLAM HCL 2 MG/2ML IJ SOLN
INTRAMUSCULAR | Status: DC | PRN
  Administered 2016-12-24: 2 mg via INTRAVENOUS
  Administered 2016-12-24: 1 mg via INTRAVENOUS

## 2016-12-24 MED ORDER — MIDAZOLAM HCL 5 MG/5ML IJ SOLN
INTRAMUSCULAR | Status: AC
  Filled 2016-12-24: qty 5

## 2016-12-24 MED ORDER — HEPARIN (PORCINE) IN NACL 2-0.9 UNIT/ML-% IJ SOLN
INTRAMUSCULAR | Status: AC
  Filled 2016-12-24: qty 1000

## 2016-12-24 MED ORDER — HEPARIN SODIUM (PORCINE) 1000 UNIT/ML IJ SOLN
INTRAMUSCULAR | Status: DC | PRN
  Administered 2016-12-24: 3000 [IU] via INTRAVENOUS

## 2016-12-24 MED ORDER — CEFAZOLIN SODIUM-DEXTROSE 2-4 GM/100ML-% IV SOLN
2.0000 g | Freq: Once | INTRAVENOUS | Status: AC
  Administered 2016-12-24: 2 g via INTRAVENOUS

## 2016-12-24 MED ORDER — LIDOCAINE-EPINEPHRINE (PF) 2 %-1:200000 IJ SOLN
INTRAMUSCULAR | Status: AC
  Filled 2016-12-24: qty 20

## 2016-12-24 SURGICAL SUPPLY — 14 items
BALLN LUTONIX DCB 6X40X130 (BALLOONS) ×3
BALLN ULTRV 018 4X60X75 (BALLOONS) ×3
BALLOON LUTONIX DCB 6X40X130 (BALLOONS) ×2 IMPLANT
BALLOON ULTRV 018 4X60X75 (BALLOONS) ×2 IMPLANT
CANNULA 5F STIFF (CANNULA) ×3 IMPLANT
CATH BEACON 5 .035 40 KMP TP (CATHETERS) ×2 IMPLANT
CATH BEACON 5 .038 40 KMP TP (CATHETERS) ×1
COVER PROBE U/S 5X48 (MISCELLANEOUS) ×3 IMPLANT
DEVICE PRESTO INFLATION (MISCELLANEOUS) ×3 IMPLANT
GLIDEWIRE STIFF .35X180X3 HYDR (WIRE) ×3 IMPLANT
PACK ANGIOGRAPHY (CUSTOM PROCEDURE TRAY) ×3 IMPLANT
SHEATH BRITE TIP 6FRX5.5 (SHEATH) ×3 IMPLANT
SUT MNCRL AB 4-0 PS2 18 (SUTURE) ×3 IMPLANT
WIRE G V18X300CM (WIRE) ×3 IMPLANT

## 2016-12-24 NOTE — Discharge Instructions (Signed)
Fistulogram, Care After °Refer to this sheet in the next few weeks. These instructions provide you with information on caring for yourself after your procedure. Your health care provider may also give you more specific instructions. Your treatment has been planned according to current medical practices, but problems sometimes occur. Call your health care provider if you have any problems or questions after your procedure. °What can I expect after the procedure? °After your procedure, it is typical to have the following: °· A small amount of discomfort in the area where the catheters were placed. °· A small amount of bruising around the fistula. °· Sleepiness and fatigue. ° °Follow these instructions at home: °· Rest at home for the day following your procedure. °· Do not drive or operate heavy machinery while taking pain medicine. °· Take medicines only as directed by your health care provider. °· Do not take baths, swim, or use a hot tub until your health care provider approves. You may shower 24 hours after the procedure or as directed by your health care provider. °· There are many different ways to close and cover an incision, including stitches, skin glue, and adhesive strips. Follow your health care provider's instructions on: °? Incision care. °? Bandage (dressing) changes and removal. °? Incision closure removal. °· Monitor your dialysis fistula carefully. °Contact a health care provider if: °· You have drainage, redness, swelling, or pain at your catheter site. °· You have a fever. °· You have chills. °Get help right away if: °· You feel weak. °· You have trouble balancing. °· You have trouble moving your arms or legs. °· You have problems with your speech or vision. °· You can no longer feel a vibration or buzz when you put your fingers over your dialysis fistula. °· The limb that was used for the procedure: °? Swells. °? Is painful. °? Is cold. °? Is discolored, such as blue or pale white. °This  information is not intended to replace advice given to you by your health care provider. Make sure you discuss any questions you have with your health care provider. °Document Released: 09/05/2013 Document Revised: 09/27/2015 Document Reviewed: 06/10/2013 °Elsevier Interactive Patient Education © 2018 [employer] Inc[employer] ° ° °Moderate Conscious Sedation, Adult, Care After °These instructions provide you with information about caring for yourself after your procedure. Your health care provider may also give you more specific instructions. Your treatment has been planned according to current medical practices, but problems sometimes occur. Call your health care provider if you have any problems or questions after your procedure. °What can I expect after the procedure? °After your procedure, it is common: °· To feel sleepy for several hours. °· To feel clumsy and have poor balance for several hours. °· To have poor judgment for several hours. °· To vomit if you eat too soon. ° °Follow these instructions at home: °For at least 24 hours after the procedure: ° °· Do not: °? Participate in activities where you could fall or become injured. °? Drive. °? Use heavy machinery. °? Drink alcohol. °? Take sleeping pills or medicines that cause drowsiness. °? Make important decisions or sign legal documents. °? Take care of children on your own. °· Rest. °Eating and drinking °· Follow the diet recommended by your health care provider. °· If you vomit: °? Drink water, juice, or soup when you can drink without vomiting. °? Make sure you have little or no nausea before eating solid foods. °General instructions °· Have a responsible adult stay   with you until you are awake and alert. °· Take over-the-counter and prescription medicines only as told by your health care provider. °· If you smoke, do not smoke without supervision. °· Keep all follow-up visits as told by your health care provider. This is important. °Contact a health care  provider if: °· You keep feeling nauseous or you keep vomiting. °· You feel light-headed. °· You develop a rash. °· You have a fever. °Get help right away if: °· You have trouble breathing. °This information is not intended to replace advice given to you by your health care provider. Make sure you discuss any questions you have with your health care provider. °Document Released: 02/09/2013 Document Revised: 09/24/2015 Document Reviewed: 08/11/2015 °Elsevier Interactive Patient Education © 2018 [employer]. ° °

## 2016-12-24 NOTE — Op Note (Signed)
[facility]    OPERATIVE NOTE   PROCEDURE: 1.   Left radiocephalic arteriovenous fistula cannulation under ultrasound guidance 2.   Left arm fistulagram including central venogram 3.   Catheter placement into left brachial artery retrograde fashion through the fistula with left upper extremity arteriogram. 4.   Percutaneous transluminal angioplasty of the radial artery and anastomosis with 4 mm diameter by 6 cm angioplasty balloon 5.   Percutaneous transluminal anoplasty the cephalic vein just beyond the anastomosis with 6 mm diameter by 4 cm length Lutonix drug-coated angioplasty balloon  PRE-OPERATIVE DIAGNOSIS: 1. ESRD 2. Poorly functional left radiocephalic AVF  POST-OPERATIVE DIAGNOSIS: same as above   SURGEON: Leotis Pain, MD  ANESTHESIA: local with MCS  ESTIMATED BLOOD LOSS: Minimal  FINDING(S): 1. Near occlusive stenosis of the left radiocephalic anastomosis including both the radial artery at just proximal to the anastomosis  and the cephalic vein just beyond the anastomosis. There was dual outflow in the upper arm through the basilic and the cephalic vein and both were widely patent. The central venous circulation was widely patent.   SPECIMEN(S):  None  CONTRAST: 35 cc  FLUORO TIME: 2.2 minutes  MODERATE CONSCIOUS SEDATION TIME: Approximately 20 minutes with 3 mg of Versed and 75 mcg of Fentanyl   INDICATIONS: Nina Johnston is a 57 y.o. female who presents with malfunctioning left arm arteriovenous fistula.  The Nina Johnston is scheduled for left arm fistulagram.  noninvasive study had also suggested arterial stenosis proximal to the fistulous we are going to assess that as well. The Nina Johnston is aware the risks include but are not limited to: bleeding, infection, thrombosis of the cannulated access, and possible anaphylactic reaction to the contrast.  The Nina Johnston is aware of the risks of the procedure and elects to proceed  forward.  DESCRIPTION: After full informed written consent was obtained, the Nina Johnston was brought back to the angiography suite and placed supine upon the angiography table.  The Nina Johnston was connected to monitoring equipment. Moderate conscious sedation was administered with a face to face encounter with the Nina Johnston throughout the procedure with my supervision of the RN administering medicines and monitoring the Nina Johnston's vital signs and mental status throughout from the start of the procedure until the Nina Johnston was taken to the recovery room. The  left arm was prepped and draped in the standard fashion for a percutaneous access intervention.  Under ultrasound guidance, the  left radiocephalic arteriovenous fistula was cannulated with a micropuncture needle under direct ultrasound guidance and a permanent image was performed.  The microwire was advanced into the fistula and the needle was exchanged for the a microsheath.  I then upsized to a 6 Fr Sheath and imaging was performed.  Hand injections were completed to image the access including the central venous system. This demonstrated near occlusive stenosis of the left radiocephalic anastomosis including both the radial artery at just proximal to the anastomosis  and the cephalic vein just beyond the anastomosis. There was dual outflow in the upper arm through the basilic and the cephalic vein and both were widely patent. The central venous circulation was widely patent.  Based on the images, this Nina Johnston will need intervention to these areas. I then gave the Nina Johnston 3000 units of intravenous heparin.  I then crossed the stenosis with a 0.018 wire.  Based on the imaging, a 4 mm x 6 cm angioplasty balloon was selected.  The balloon was centered around the radial artery and the anastomotic stenosis  and inflated to 10 ATM for 1 minute(s).  On completion imaging, a <20 % residual stenosis was present  in the radial artery and at the anastomosis, but a greater than  50% residual stenosis in the cephalic vein beyond the anastomosis was seen. I then upsized to a 6 mm diameter by 4 cm length Lutonix drug-coated angioplasty balloon to treat the cephalic vein taking care not to advance the tip of this balloon into the anastomosis or radial artery. This was inflated up to about 12 atm for 1 minute. Completion antrum showed only about a 20-30% residual stenosis in the cephalic vein and there was a good thrill within the fistula     Based on the completion imaging, no further intervention is necessary.  The wire and balloon were removed from the sheath.  A 4-0 Monocryl purse-string suture was sewn around the sheath.  The sheath was removed while tying down the suture.  A sterile bandage was applied to the puncture site.  COMPLICATIONS: None  CONDITION: Stable   Leotis Pain  12/24/2016 3:54 PM   This note was created with Dragon Medical transcription system. Any errors in dictation are purely unintentional.

## 2016-12-24 NOTE — H&P (Signed)
[facility] History & Physical Update  The patient was interviewed and re-examined.  The patient's previous History and Physical has been reviewed and is unchanged.  There is no change in the plan of care. We plan to proceed with the scheduled procedure.  Leotis Pain, MD  12/24/2016, 1:18 PM

## 2016-12-25 ENCOUNTER — Encounter: Payer: Self-pay | Admitting: Vascular Surgery

## 2017-01-03 IMAGING — US US EXTREM LOW VENOUS BILAT
1 series · 13 of 24 positions shown · non-contrast
Comparison: None.

CLINICAL DATA: Bilateral leg swelling for several years.



[Series 1: us extrem low venous bilat · 0.07mm/px · 13 of 79 slices shown]
[im 1/79]
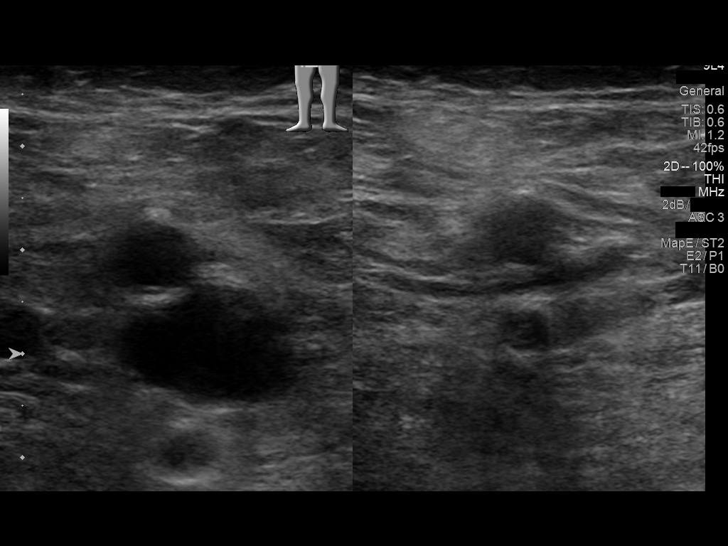
[im 7/79]
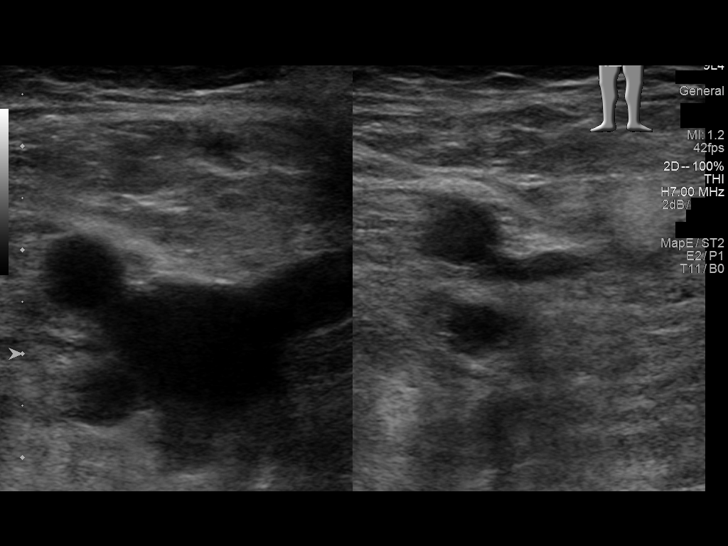
[im 14/79]
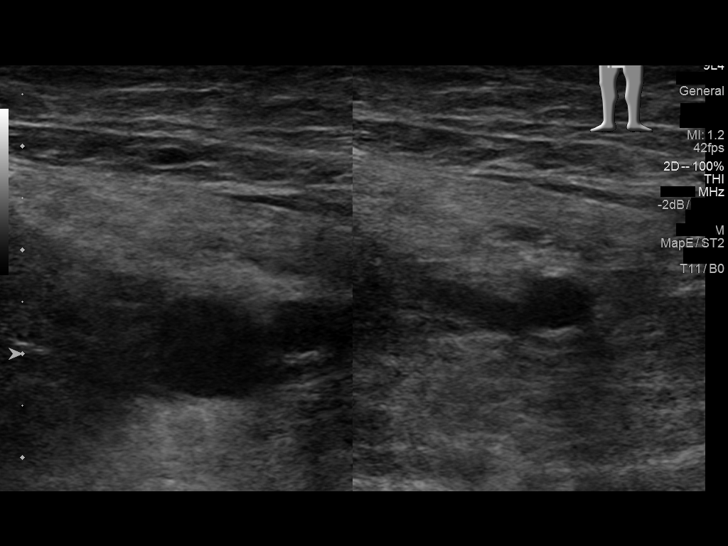
[im 21/79]
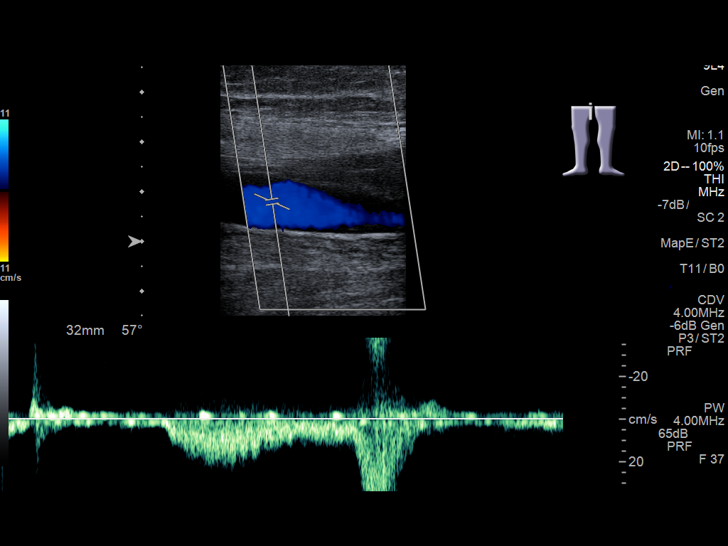
[im 28/79]
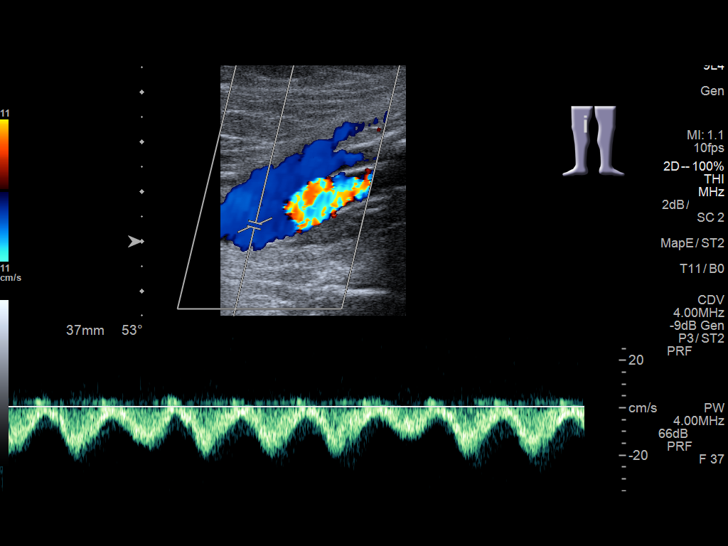
[im 34/79]
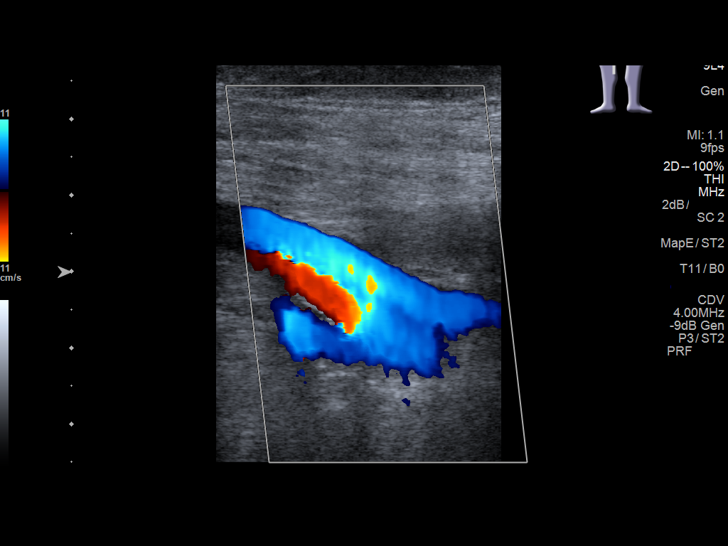
[im 41/79]
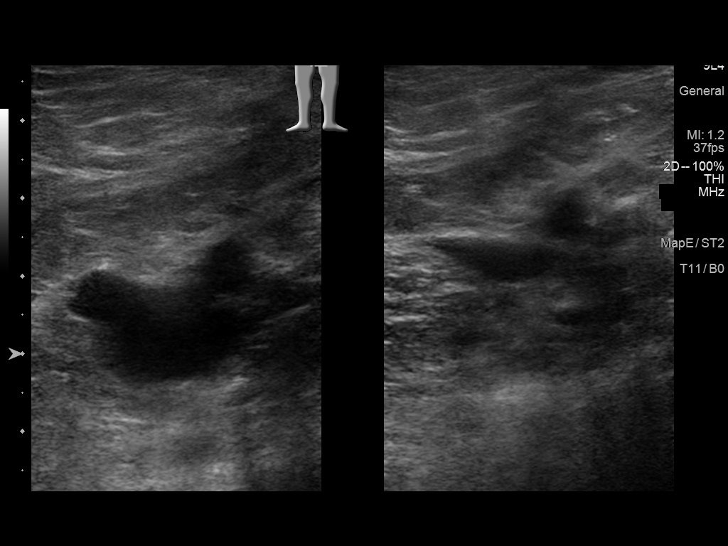
[im 45/79]
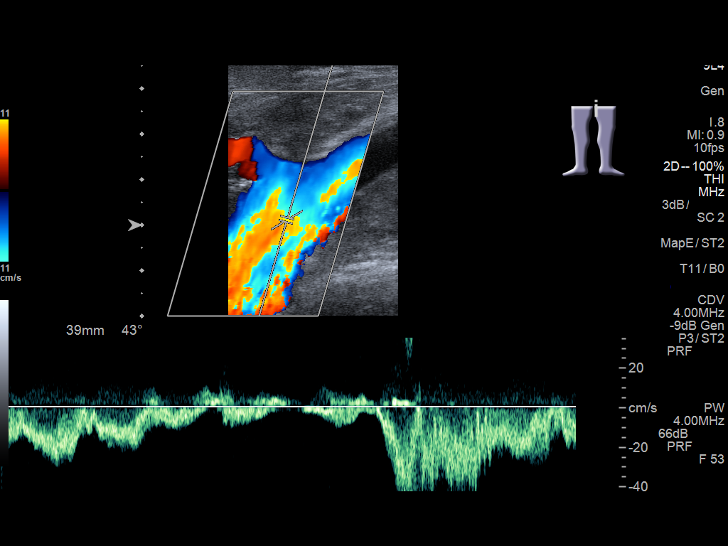
[im 51/79]
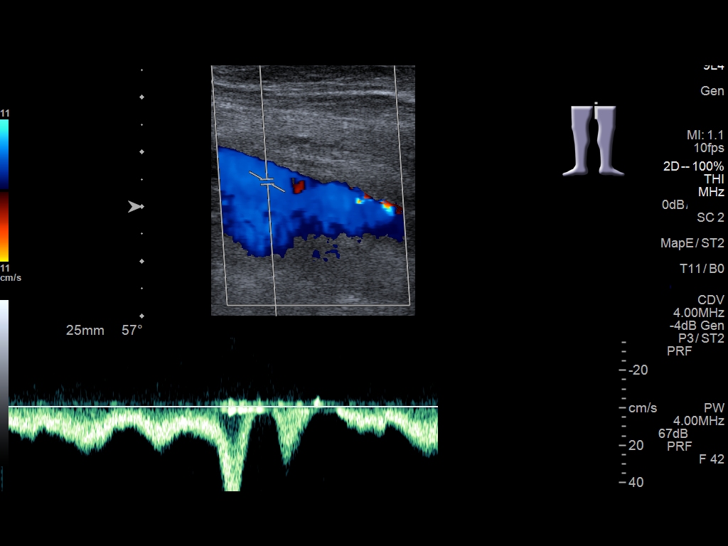
[im 58/79]
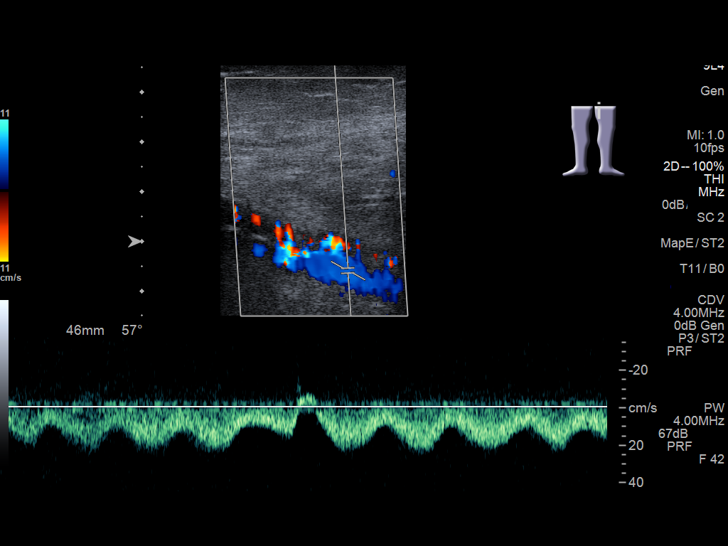
[im 65/79]
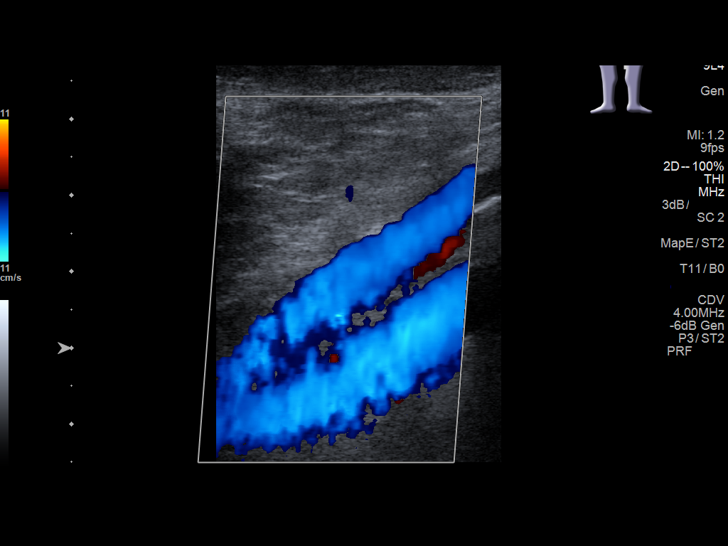
[im 72/79]
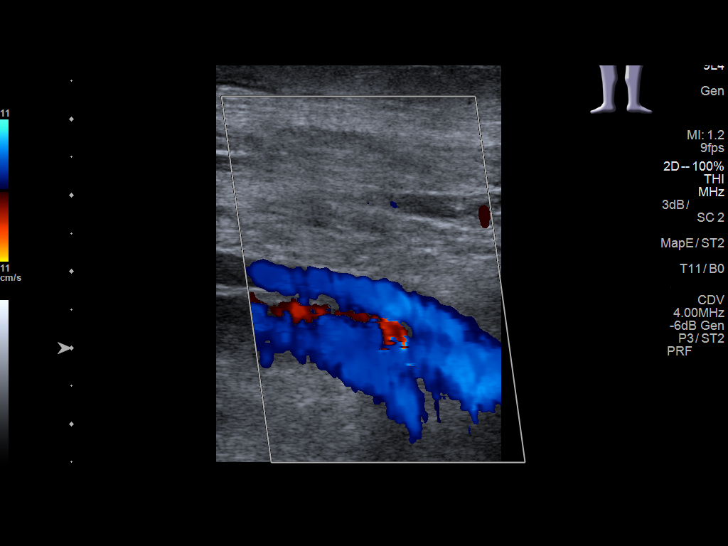
[im 79/79]
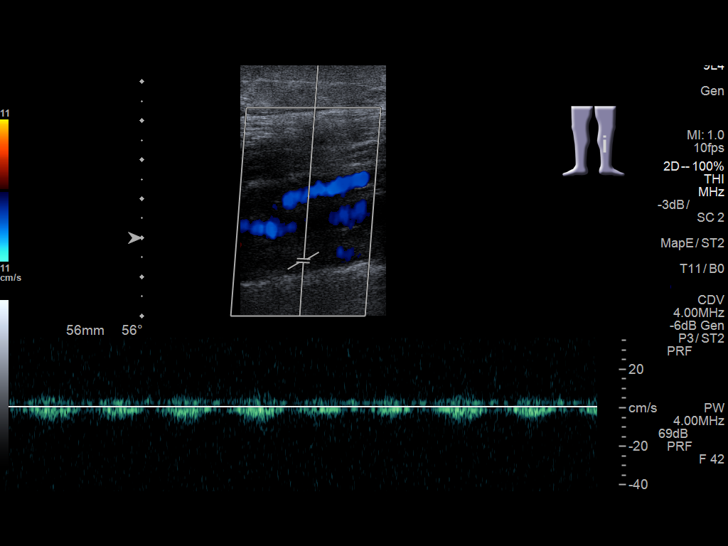

[13 of 24 positions shown; findings below may reference images not displayed]

FINDINGS: RIGHT LOWER EXTREMITY

Common Femoral Vein: No evidence of thrombus. Normal
compressibility, respiratory phasicity and response to augmentation.

Saphenofemoral Junction: No evidence of thrombus. Normal
compressibility and flow on color Doppler imaging.

Profunda Femoral Vein: No evidence of thrombus. Normal
compressibility and flow on color Doppler imaging.

Femoral Vein: No evidence of thrombus. Normal compressibility,
respiratory phasicity and response to augmentation.

Popliteal Vein: No evidence of thrombus. Normal compressibility,
respiratory phasicity and response to augmentation.

Calf Veins: No evidence of thrombus. Normal compressibility and flow
on color Doppler imaging.

LEFT LOWER EXTREMITY

Common Femoral Vein: No evidence of thrombus. Normal
compressibility, respiratory phasicity and response to augmentation.

Saphenofemoral Junction: No evidence of thrombus. Normal
compressibility and flow on color Doppler imaging.

Profunda Femoral Vein: No evidence of thrombus. Normal
compressibility and flow on color Doppler imaging.

Femoral Vein: No evidence of thrombus. Normal compressibility,
respiratory phasicity and response to augmentation.

Popliteal Vein: No evidence of thrombus. Normal compressibility,
respiratory phasicity and response to augmentation.

Calf Veins: No evidence of thrombus. Normal compressibility and flow
on color Doppler imaging.
IMPRESSION: No evidence of deep venous thrombosis.

## 2017-01-07 DIAGNOSIS — R195 Other fecal abnormalities: Secondary | ICD-10-CM | POA: Insufficient documentation

## 2017-01-09 ENCOUNTER — Ambulatory Visit
Admission: RE | Admit: 2017-01-09 | Discharge: 2017-01-09 | Disposition: A | Discharge status: Home / Self Care | Payer: Medicare Other | Source: Ambulatory Visit | Attending: Nephrology | Admitting: Nephrology

## 2017-01-09 DIAGNOSIS — D649 Anemia, unspecified: Secondary | ICD-10-CM | POA: Diagnosis not present

## 2017-01-09 LAB — PREPARE RBC (CROSSMATCH)

## 2017-01-09 MED ORDER — SODIUM CHLORIDE 0.9 % IV SOLN: Freq: Once | INTRAVENOUS | Status: DC

## 2017-01-10 LAB — BPAM RBC
Blood Product Expiration Date: 201809122359
ISSUE DATE / TIME: 201809071148
Unit Type and Rh: 9500

## 2017-01-10 LAB — TYPE AND SCREEN
ABO/RH(D): O POS
Antibody Screen: NEGATIVE
Unit division: 0

## 2017-01-21 ENCOUNTER — Other Ambulatory Visit: Payer: Self-pay | Admitting: Neurology

## 2017-01-21 DIAGNOSIS — T8619 Other complication of kidney transplant: Secondary | ICD-10-CM

## 2017-01-21 DIAGNOSIS — R404 Transient alteration of awareness: Secondary | ICD-10-CM

## 2017-01-21 DIAGNOSIS — R270 Ataxia, unspecified: Secondary | ICD-10-CM

## 2017-01-21 DIAGNOSIS — Z992 Dependence on renal dialysis: Secondary | ICD-10-CM

## 2017-01-21 DIAGNOSIS — R4789 Other speech disturbances: Secondary | ICD-10-CM

## 2017-01-22 ENCOUNTER — Other Ambulatory Visit: Payer: Self-pay | Admitting: Neurology

## 2017-01-22 ENCOUNTER — Telehealth: Payer: Self-pay | Admitting: Neurology

## 2017-01-22 DIAGNOSIS — T8619 Other complication of kidney transplant: Secondary | ICD-10-CM

## 2017-01-22 DIAGNOSIS — R404 Transient alteration of awareness: Secondary | ICD-10-CM

## 2017-01-22 DIAGNOSIS — R4789 Other speech disturbances: Secondary | ICD-10-CM

## 2017-01-22 DIAGNOSIS — R270 Ataxia, unspecified: Secondary | ICD-10-CM

## 2017-01-22 DIAGNOSIS — Z992 Dependence on renal dialysis: Secondary | ICD-10-CM

## 2017-01-22 MED ORDER — ETHOSUXIMIDE 250 MG/5ML PO SOLN: ORAL | 3 refills | Status: DC

## 2017-01-22 NOTE — Telephone Encounter (Signed)
I have re ordered this medication and sent to the patients pharmacy.

## 2017-01-22 NOTE — Telephone Encounter (Signed)
Pt's mother called she reached out to our office earlier this morning requested refill forethosuximide (ZARONTIN) 250 MG/5ML solution  and was advised to call the pharmacy as there was 3 refills. She called [facility] and was advised RX did not show any refills. Pt will be out of this medication sometime this weekend. Please send to [facility]

## 2017-01-30 ENCOUNTER — Ambulatory Visit (INDEPENDENT_AMBULATORY_CARE_PROVIDER_SITE_OTHER): Payer: Medicare Other | Admitting: Vascular Surgery

## 2017-02-06 ENCOUNTER — Encounter (INDEPENDENT_AMBULATORY_CARE_PROVIDER_SITE_OTHER): Payer: Self-pay | Admitting: Vascular Surgery

## 2017-02-06 ENCOUNTER — Ambulatory Visit (INDEPENDENT_AMBULATORY_CARE_PROVIDER_SITE_OTHER): Payer: Medicare Other | Admitting: Vascular Surgery

## 2017-02-06 VITALS — BP 135/61 | HR 80 | Resp 16 | Ht 62.0 in | Wt 208.0 lb

## 2017-02-06 DIAGNOSIS — I10 Essential (primary) hypertension: Secondary | ICD-10-CM | POA: Diagnosis not present

## 2017-02-06 DIAGNOSIS — E118 Type 2 diabetes mellitus with unspecified complications: Secondary | ICD-10-CM | POA: Diagnosis not present

## 2017-02-06 DIAGNOSIS — Z992 Dependence on renal dialysis: Secondary | ICD-10-CM

## 2017-02-06 DIAGNOSIS — N186 End stage renal disease: Secondary | ICD-10-CM

## 2017-02-06 NOTE — Progress Notes (Signed)
Subjective:    Patient ID: Nina Johnston, female    DOB: 08/01/1960, 56 y.o.   MRN: [id number] Chief Complaint  Patient presents with  . Follow-up    6wk f/u   Patient presents for her first post procedure follow-up. She is status post a left upper kidney fistulogram on 12/24/2016 for a poorly functioning. Patient reports restored function during dialysis since her procedure. Patient reports her hemodialysis doppler flow is 1500. The patient denies any issues with hemodialysis such as cannulation problems, increased bleeding, decrease in doppler flow or recirculation. The patient also denies any fistula skin breakdown, pain, edema, pallor or ulceration of the arm / hand.    Review of Systems  Constitutional: Negative.   HENT: Negative.   Eyes: Negative.   Respiratory: Negative.   Cardiovascular: Negative.   Gastrointestinal: Negative.   Endocrine: Negative.   Genitourinary:       ESRD  Musculoskeletal: Negative.   Skin: Negative.   Allergic/Immunologic: Negative.   Neurological: Negative.   Hematological: Negative.   Psychiatric/Behavioral: Negative.       Objective:   Physical Exam  Constitutional: She is oriented to person, place, and time. She appears well-developed and well-nourished. No distress.  HENT:  Head: Normocephalic and atraumatic.  Eyes: Pupils are equal, round, and reactive to light. Conjunctivae are normal.  Neck: Normal range of motion.  Cardiovascular: Normal rate, regular rhythm, normal heart sounds and intact distal pulses.   Pulses:      Radial pulses are 2+ on the left side.  Left upper extremity: dialysis access intact. Good bruit and thrill noted.  Pulmonary/Chest: Effort normal.  Musculoskeletal: Normal range of motion. She exhibits no edema.  Neurological: She is alert and oriented to person, place, and time.  Skin: Skin is warm and dry. She is not diaphoretic.  Psychiatric: She has a normal mood and affect. Her behavior is normal. Judgment  and thought content normal.  Vitals reviewed.  BP 135/61 (BP Location: Right Arm)   Pulse 80   Resp 16   Ht 5\' 2"  (1.575 m)   Wt 208 lb (94.3 kg)   BMI 38.04 kg/m   Past Medical History:  Diagnosis Date  . Acute bacterial sinusitis 04/19/2014  . Anxiety   . Anxiety and depression   . Asthma   . 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 Benign essential HTN 12/26/2014  . Bipolar disorder ([place])   . Bronchitis   . Carotid artery narrowing 12/22/2014  . CKD (chronic kidney disease)   . Concussion 01-14-14   Fall   . COPD (chronic obstructive pulmonary disease) ([place])   . Depression   . Diabetes mellitus without complication ([place])   . Dialysis patient ([place])   . Diplopia   . Essential (primary) hypertension 02/12/2015  . Family history of thyroid problem   . GERD (gastroesophageal reflux disease)   . Heart disease    enlarged because of COPD  . Heart murmur   . HLD (hyperlipidemia)   . Hyperkalemia   . Hypertension   . Hyperthyroidism   . Morbid obesity ([place])   . Multiple thyroid nodules   . OAB (overactive bladder)   . Pernicious anemia   . Post-concussion syndrome 03/08/2014  . Renal disease    w/ GFR 29-may be due to diabetes  . Seizures ([place])    last seizure 2012. has had spells since with no knowledge.  last was 1 month ago  . Sleep apnea   . 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 Transient alteration of awareness 11/10/2012  . Urinary incontinence with continuous leakage 03/08/2014   Social History   Social History  . Marital status: Single    Spouse name: N/A  . Number of children: 0  . Years of education:  college   Occupational History  . not employed    Social History Main Topics  . Smoking status: Never Smoker  . Smokeless tobacco: Never Used  . Alcohol use No  . Drug use: No  . Sexual activity: Not Currently   Other Topics Concern  . Not on file   Social History Narrative    Patient is single and lives with her parents.   Patient is disabled.   Patient has a college education.   Patient is right- handed.   Patient drinks tea occasionally. 2 glasses of tea when they eat out.   Past Surgical History:  Procedure Laterality Date  . A/V FISTULAGRAM Left 07/21/2016   Procedure: A/V Fistulagram;  Surgeon: Algernon Huxley, MD;  Location: [facility];  Service: Cardiovascular;  Laterality: Left;  . A/V FISTULAGRAM Left 12/24/2016   Procedure: A/V Fistulagram;  Surgeon: Algernon Huxley, MD;  Location: [facility];  Service: Cardiovascular;  Laterality: Left;  . A/V SHUNT INTERVENTION N/A 07/21/2016   Procedure: A/V Shunt Intervention;  Surgeon: Algernon Huxley, MD;  Location: [facility];  Service: Cardiovascular;  Laterality: N/A;  . A/V SHUNT INTERVENTION N/A 12/24/2016   Procedure: A/V SHUNT INTERVENTION;  Surgeon: Algernon Huxley, MD;  Location: [facility];  Service: Cardiovascular;  Laterality: N/A;  . AVF    . CHOLECYSTECTOMY    . ENDOMETRIAL BIOPSY     ablation, uterine  . HERNIA REPAIR    . HERNIA REPAIR    . PERIPHERAL VASCULAR CATHETERIZATION N/A 08/23/2015   Procedure: Dialysis/Perma Catheter Insertion;  Surgeon: Algernon Huxley, MD;  Location: [facility];  Service: Cardiovascular;  Laterality: N/A;  . PERIPHERAL VASCULAR CATHETERIZATION N/A 11/09/2015   Procedure: Dialysis/Perma Catheter Removal;  Surgeon: Katha Cabal, MD;  Location: [facility];  Service: Cardiovascular;  Laterality: N/A;  . PERIPHERAL VASCULAR CATHETERIZATION Left 04/23/2016   Procedure: A/V Fistulagram;  Surgeon: Algernon Huxley, MD;  Location: [facility];  Service: Cardiovascular;  Laterality: Left;  . PORT A CATH REVISION     Family History  Problem Relation Age of Onset  . Diabetes Mother   . Lumbar disc disease Mother   . Migraines Mother   . Hypertension Mother   . Cancer - Prostate Father        mild  . Diabetes Father   .  Hypertension Father   . Breast cancer Neg Hx   . Ovarian cancer Neg Hx   . Colon cancer Neg Hx   . Heart disease Neg Hx   . Kidney cancer Neg Hx   . Bladder Cancer Neg Hx    Allergies  Allergen Reactions  . Cinnamon Other (See Comments)    Raises blood pressure  . Procrit [Epoetin Alfa]     Other reaction(s): Other (See Comments) Seizures, grand mal Seizures, grand mal  . Ace Inhibitors Cough  . Azithromycin Other (See Comments)    seizures  . Iron     Can not take in IV Form - causes seizures   . Neomycin-Bacitracin Zn-Polymyx Other (  See Comments)    Unknown  . Sulfa Antibiotics     Other reaction(s): Other (See Comments) Due to risks for seizures.      Assessment & Plan:  Patient presents for her first post procedure follow-up. She is status post a left upper kidney fistulogram on 12/24/2016 for a poorly functioning. Patient reports restored function during dialysis since her procedure. Patient reports her hemodialysis doppler flow is 1500. The patient denies any issues with hemodialysis such as cannulation problems, increased bleeding, decrease in doppler flow or recirculation. The patient also denies any fistula skin breakdown, pain, edema, pallor or ulceration of the arm / hand.   1. ESRD on dialysis [facility]) - stable Patient with improved function to dialysis access status post fistulogram on 12/24/2016. The patient is doing well and currently has adequate dialysis access. The patient should continue to have duplex ultrasounds of the dialysis access every six months. The patient was instructed to call the office in the interim if any issues with dialysis access / doppler flow, pain, edema, pallor, fistula skin breakdown or ulceration of the arm / hand occur. The patient expressed their understanding.  - VAS US DUPLEX DIALYSIS ACCESS (AVF,AVG); Future  2. Type 2 diabetes mellitus with complication, unspecified whether long term insulin use (HCC) - Stable Encouraged good  control as its slows the progression of atherosclerotic disease  3. Essential (primary) hypertension - Stable Encouraged good control as its slows the progression of atherosclerotic disease  Current Outpatient Prescriptions on File Prior to Visit  Medication Sig Dispense Refill  . amLODipine (NORVASC) 5 MG tablet Take 5 mg by mouth See admin instructions. Only takes on non-dialysis days    . aspirin 81 MG tablet Take 81 mg by mouth daily.     Marland Kitchen b complex vitamins tablet Take 1 tablet by mouth every evening.     . carbamazepine (TEGRETOL-XR) 100 MG 12 hr tablet Two tablets in the morning and one tablet in the evening.  Please keep pending appt. on December 03, 2016 in order to receive future refills. (Patient taking differently: Take 100-200 mg by mouth See admin instructions. Two tablets in the morning and one tablet in the evening.  Please keep pending appt. on December 03, 2016 in order to receive future refills. Does not take on mornings of dialysis) 90 tablet 1  . cefdinir (OMNICEF) 300 MG capsule Take 300 mg by mouth daily. 14 day course started 12-15-16    . chlorpheniramine-HYDROcodone (TUSSIONEX) 10-8 MG/5ML SUER Take 2.5 mLs by mouth every 12 (twelve) hours as needed for cough.    . clonazePAM (KLONOPIN) 1 MG tablet Take 1 tablet (1 mg total) by mouth at bedtime. 30 tablet 5  . cyanocobalamin (,VITAMIN B-12,) 1000 MCG/ML injection Inject 1,000 mcg into the muscle every 14 (fourteen) days.     Mariane Baumgarten Calcium (STOOL SOFTENER PO) Take 1 capsule by mouth every morning.     Marland Kitchen ethosuximide (ZARONTIN) 250 MG/5ML solution TAKE 5ML BY MOUTH 2 TIMES DAILY 474 mL 3  . Febuxostat (ULORIC) 80 MG TABS Take 1 tablet by mouth daily. Morning dosage     . fexofenadine (ALLEGRA) 180 MG tablet Take 180 mg by mouth daily. am    . Fluticasone-Salmeterol (ADVAIR DISKUS) 250-50 MCG/DOSE AEPB Inhale 1 puff into the lungs 2 (two) times daily.    . folic acid-vitamin b complex-vitamin c-selenium-zinc (DIALYVITE) 3  MG TABS tablet Take 1 tablet by mouth daily.    . furosemide (LASIX) 20 MG tablet  Take 30 mg by mouth 2 (two) times daily. Takes 1.5 tablet    . glucose blood (ONE TOUCH ULTRA TEST) test strip Use 4 (four) times daily. Test 3 - 4 times daily E11.21    . GuaiFENesin (MUCINEX PO) Take 1 tablet by mouth 2 (two) times daily as needed. Expectorant, as needed     . hydrALAZINE (APRESOLINE) 100 MG tablet Take 100 mg by mouth 3 (three) times daily. No morning dose on dialysis days    . insulin glargine (LANTUS) 100 UNIT/ML injection Inject 26 Units into the skin at bedtime.     . insulin lispro (HUMALOG) 100 UNIT/ML injection Inject 8-16 Units into the skin 2 (two) times daily with a meal. Sliding scale takes in sugars are over 200-299 2 units, 300-399 3 units, over 400 4 units.     . Insulin Pen Needle (BD PEN NEEDLE NANO U/F) 32G X 4 MM MISC     . lamoTRIgine (LAMICTAL) 200 MG tablet Take 1 tablet (200 mg total) by mouth 2 (two) times daily. (Patient taking differently: Take 300 mg by mouth 2 (two) times daily. Takes 1.5 tablet.  No generics must be glaxo brand others cause grand mal seizures) 180 tablet 2  . levothyroxine (SYNTHROID) 150 MCG tablet Take 150 mcg by mouth daily before breakfast. Am dosage     . metoprolol succinate (TOPROL XL) 50 MG 24 hr tablet Take 1 tablet (50 mg total) by mouth 2 (two) times daily. Take with or immediately following a meal. (Patient taking differently: Take 75 mg by mouth 2 (two) times daily. Take with or immediately following a meal. Does not take on mornings of dialysis) 60 tablet 11  . montelukast (SINGULAIR) 10 MG tablet Take 10 mg by mouth at bedtime.    Marland Kitchen nystatin-triamcinolone (MYCOLOG II) cream Apply 1 application topically 2 (two) times daily. (Patient taking differently: Apply 1 application topically daily as needed (rash). ) 30 g 1  . pantoprazole (PROTONIX) 40 MG tablet Take 40 mg by mouth every morning.     . predniSONE (DELTASONE) 10 MG tablet Take 10-60  mg by mouth daily with breakfast. Taper dose starting 12-15-16    . Probiotic Product (ALIGN) 4 MG CAPS Take 4 mg by mouth daily.     . sevelamer carbonate (RENVELA) 800 MG tablet Take 3,200 mg by mouth 3 (three) times daily with meals.     . triamcinolone (NASACORT ALLERGY 24HR) 55 MCG/ACT AERO nasal inhaler Place 2 sprays into the nose at bedtime.      No current facility-administered medications on file prior to visit.     There are no Patient Instructions on file for this visit. No Follow-up on file.   Tarrin Lebow A Anahlia Iseminger, PA-C

## 2017-02-06 NOTE — Telephone Encounter (Signed)
ERROR

## 2017-02-13 ENCOUNTER — Ambulatory Visit
Admission: RE | Admit: 2017-02-13 | Discharge: 2017-02-13 | Disposition: A | Discharge status: Home / Self Care | Payer: Medicare Other | Source: Ambulatory Visit | Attending: Nephrology | Admitting: Nephrology

## 2017-02-13 DIAGNOSIS — D631 Anemia in chronic kidney disease: Secondary | ICD-10-CM | POA: Diagnosis not present

## 2017-02-13 DIAGNOSIS — N189 Chronic kidney disease, unspecified: Secondary | ICD-10-CM | POA: Insufficient documentation

## 2017-02-13 LAB — PREPARE RBC (CROSSMATCH)

## 2017-02-13 MED ORDER — LIDOCAINE HCL (PF) 1 % IJ SOLN
INTRAMUSCULAR | Status: AC
  Filled 2017-02-13: qty 2

## 2017-02-13 MED ORDER — SODIUM CHLORIDE 0.9 % IV SOLN: Freq: Once | INTRAVENOUS | Status: DC

## 2017-02-14 LAB — TYPE AND SCREEN
ABO/RH(D): O POS
Antibody Screen: NEGATIVE
Unit division: 0

## 2017-02-14 LAB — BPAM RBC
Blood Product Expiration Date: 201810272359
ISSUE DATE / TIME: 201810121139
Unit Type and Rh: 5100

## 2017-02-23 ENCOUNTER — Encounter: Payer: Self-pay | Admitting: Emergency Medicine

## 2017-02-23 ENCOUNTER — Emergency Department
Admission: EM | Admit: 2017-02-23 | Discharge: 2017-02-23 | Disposition: A | Discharge status: Home / Self Care | Payer: Medicare Other | Attending: Emergency Medicine | Admitting: Emergency Medicine

## 2017-02-23 DIAGNOSIS — J449 Chronic obstructive pulmonary disease, unspecified: Secondary | ICD-10-CM | POA: Diagnosis not present

## 2017-02-23 DIAGNOSIS — Z79899 Other long term (current) drug therapy: Secondary | ICD-10-CM | POA: Insufficient documentation

## 2017-02-23 DIAGNOSIS — E1122 Type 2 diabetes mellitus with diabetic chronic kidney disease: Secondary | ICD-10-CM | POA: Insufficient documentation

## 2017-02-23 DIAGNOSIS — E039 Hypothyroidism, unspecified: Secondary | ICD-10-CM | POA: Diagnosis not present

## 2017-02-23 DIAGNOSIS — K922 Gastrointestinal hemorrhage, unspecified: Secondary | ICD-10-CM | POA: Diagnosis not present

## 2017-02-23 DIAGNOSIS — I129 Hypertensive chronic kidney disease with stage 1 through stage 4 chronic kidney disease, or unspecified chronic kidney disease: Secondary | ICD-10-CM | POA: Insufficient documentation

## 2017-02-23 DIAGNOSIS — Z7982 Long term (current) use of aspirin: Secondary | ICD-10-CM | POA: Insufficient documentation

## 2017-02-23 DIAGNOSIS — N184 Chronic kidney disease, stage 4 (severe): Secondary | ICD-10-CM | POA: Insufficient documentation

## 2017-02-23 DIAGNOSIS — K625 Hemorrhage of anus and rectum: Secondary | ICD-10-CM | POA: Diagnosis present

## 2017-02-23 DIAGNOSIS — Z794 Long term (current) use of insulin: Secondary | ICD-10-CM | POA: Diagnosis not present

## 2017-02-23 DIAGNOSIS — J45909 Unspecified asthma, uncomplicated: Secondary | ICD-10-CM | POA: Insufficient documentation

## 2017-02-23 LAB — COMPREHENSIVE METABOLIC PANEL
ALT: 13 U/L — ABNORMAL LOW (ref 14–54)
AST: 16 U/L (ref 15–41)
Albumin: 3.8 g/dL (ref 3.5–5.0)
Alkaline Phosphatase: 205 U/L — ABNORMAL HIGH (ref 38–126)
Anion gap: 12 (ref 5–15)
BUN: 59 mg/dL — ABNORMAL HIGH (ref 6–20)
CO2: 26 mmol/L (ref 22–32)
Calcium: 8.4 mg/dL — ABNORMAL LOW (ref 8.9–10.3)
Chloride: 103 mmol/L (ref 101–111)
Creatinine, Ser: 6.07 mg/dL — ABNORMAL HIGH (ref 0.44–1.00)
GFR calc Af Amer: 8 mL/min — ABNORMAL LOW (ref >60)
GFR calc non Af Amer: 7 mL/min — ABNORMAL LOW (ref >60)
Glucose, Bld: 214 mg/dL — ABNORMAL HIGH (ref 65–99)
Potassium: 4.9 mmol/L (ref 3.5–5.1)
Sodium: 141 mmol/L (ref 135–145)
Total Bilirubin: 0.7 mg/dL (ref 0.3–1.2)
Total Protein: 6.8 g/dL (ref 6.5–8.1)

## 2017-02-23 LAB — CBC
HCT: 24.2 % — ABNORMAL LOW (ref 35.0–47.0)
Hemoglobin: 8.1 g/dL — ABNORMAL LOW (ref 12.0–16.0)
MCH: 33.1 pg (ref 26.0–34.0)
MCHC: 33.4 g/dL (ref 32.0–36.0)
MCV: 99.1 fL (ref 80.0–100.0)
Platelets: 195 10*3/uL (ref 150–440)
RBC: 2.44 MIL/uL — ABNORMAL LOW (ref 3.80–5.20)
RDW: 14.8 % — ABNORMAL HIGH (ref 11.5–14.5)
WBC: 9.5 10*3/uL (ref 3.6–11.0)

## 2017-02-23 LAB — TYPE AND SCREEN
ABO/RH(D): O POS
Antibody Screen: NEGATIVE

## 2017-02-23 NOTE — Discharge Instructions (Signed)
Please seek medical attention for any high fevers, chest pain, shortness of breath, change in behavior, persistent vomiting, bloody stool or any other new or concerning symptoms.  

## 2017-02-23 NOTE — ED Triage Notes (Signed)
Patient presents to the ED with 3 episodes of blood in toilet or in stool since Saturday.  Patient states she had rectal bleeding about 5 weeks ago as well.  Patient states she had a blood transfusion on Sept. 7th and Oct. 12th.  Patient has history of anemia.  Patient states she has had 8 blood transfusions during her lifetime.  Patient denies taking blood thinners.  Patient denies any dizziness or weakness today.  Patient is in no obvious distress at this time.

## 2017-02-23 NOTE — ED Notes (Signed)
Dr. Goodman at bedside.  

## 2017-02-23 NOTE — ED Provider Notes (Signed)
[facility] Emergency Department Provider Note  ____________________________________________   I have reviewed the triage vital signs and the nursing notes.   HISTORY  Chief Complaint Rectal Bleeding   History limited by: Not Limited   HPI Nina Johnston is a 56 y.o. female who presents to the emergency department today because of concern for rectal bleeding.   LOCATION:rectum DURATION:2 days ago TIMING: 2 discreet episodes QUALITY: bright red blood CONTEXT: patient states she has a history of intermittent gi bleeding. Had an episode early last month and is working on getting colonoscopy scheduled. Had 2 discreet episodes of GI bleed, one 2 days ago and one yesterday. Has not had any bleeding today. Did have a normal bowel movement today.  ASSOCIATED SYMPTOMS: No rectal pain.   Per medical record review patient has a history of GI bleed, is working with Dr. Edd Fabian office (GI) to get colonoscopy scheduled.   Past Medical History:  Diagnosis Date  . Acute bacterial sinusitis 04/19/2014  . Anxiety   . Anxiety and depression   . Asthma   . 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 Benign essential HTN 12/26/2014  . Bipolar disorder ([place])   . Bronchitis   . Carotid artery narrowing 12/22/2014  . CKD (chronic kidney disease)   . Concussion 01-14-14   Fall   . COPD (chronic obstructive pulmonary disease) ([place])   . Depression   . Diabetes mellitus without complication ([place])   . Dialysis patient ([place])   . Diplopia   . Essential (primary) hypertension 02/12/2015  . Family history of thyroid problem   . GERD (gastroesophageal reflux disease)   . Heart disease    enlarged because of COPD  . Heart murmur   . HLD (hyperlipidemia)   . Hyperkalemia   . Hypertension   . Hyperthyroidism   . Morbid obesity ([place])   . Multiple thyroid nodules   . OAB (overactive bladder)   .  Pernicious anemia   . Post-concussion syndrome 03/08/2014  . Renal disease    w/ GFR 29-may be due to diabetes  . Seizures ([place])    last seizure 2012. has had spells since with no knowledge. last was 1 month ago  . Sleep apnea   . 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 Transient alteration of awareness 11/10/2012  . Urinary incontinence with continuous leakage 03/08/2014    Patient Active Problem List   Diagnosis Date Noted  . ESRD on dialysis ([place]) 02/06/2017  . Delay kidney tx func d/t fluid overload requiring acute dialysis ([place]) 12/03/2016  . Menopause 10/22/2016  . Status post endometrial ablation 10/22/2016  . Swelling of limb 04/18/2016  . Kidney dialysis as the cause of abnormal reaction of the patient, or of later complication, without mention of misadventure at the time of the procedure (CODE) 04/18/2016  . End-stage renal disease on hemodialysis ([place]) 08/24/2015  . OAB (overactive bladder) 06/07/2015  . Incontinence 06/07/2015  . Airway hyperreactivity 06/06/2015  . Chronic kidney disease, stage V ([place]) 06/06/2015  . Binocular vision disorder with diplopia 06/06/2015  . Type 2 diabetes mellitus ([place]) 06/01/2015  . Severe diabetic hypoglycemia ([place]) 04/16/2015  . Convulsions ([place]) 02/12/2015  . Hypoglycemic reaction 02/12/2015  . Chronic kidney disease (CKD), stage IV (severe) ([place]) 02/12/2015  . Other symptoms and signs concerning food and fluid intake 02/12/2015  . Absence of menstruation 02/12/2015  . Absolute anemia 02/12/2015  .  Post menopausal syndrome 02/12/2015  . Calculus of kidney 02/12/2015  . Chronic obstructive pulmonary disease ([place]) 02/12/2015  . Bone/cartilage disorder 02/12/2015  . Urinary system disease 02/12/2015  . Encounter for gynecological examination without abnormal finding 02/12/2015  . 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 Essential (primary) hypertension 02/12/2015  . Gout 02/12/2015  . Hernia, internal 02/12/2015  .  Hypoglycemia 02/12/2015  . Adaptive colitis 02/12/2015  . Arthritis, degenerative 02/12/2015  . Postprocedural state 02/12/2015  . Dupuytren's contracture of foot 02/12/2015  . Cutaneous eruption 02/12/2015  . 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 Absence of bladder continence 02/12/2015  . Cervical prolapse 02/12/2015  . Seizure ([place]) 02/12/2015  . Benign essential HTN 12/26/2014  . Carotid artery narrowing 12/22/2014  . Chest pain 12/22/2014  . Combined fat and carbohydrate induced hyperlipemia 12/22/2014  . Acute bacterial sinusitis 04/19/2014  . Anemia in chronic illness 03/16/2014  . Urinary incontinence with continuous leakage 03/08/2014  . Post-concussion syndrome 03/08/2014  . Total urinary incontinence 03/08/2014  . 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 Apnea, sleep 01/17/2014  . Patient awaiting renal transplant 06/03/2013  . Bipolar affective disorder ([place]) 12/16/2012  . Acid reflux 12/16/2012  . Class 2 severe obesity due to excess calories with serious comorbidity and body mass index (BMI) of 37.0 to 37.9 in adult ([place]) 12/16/2012  . Obstructive apnea 12/16/2012  . Addison anemia 12/16/2012  . Seizure disorder ([place]) 12/16/2012  . Ataxia 11/10/2012  . 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 Transient alteration of awareness 11/10/2012  . Severe obesity (BMI >= 40) ([place]) 11/10/2012  . Morbid obesity ([place]) 11/10/2012  . Speech disorder 11/10/2012  . Encounter for general adult medical examination without abnormal findings 09/08/2012  . Multinodular goiter 01/29/2012  . Cardiac murmur 10/29/2011  . High potassium 10/28/2011  . Adult hypothyroidism 03/12/2011    Past Surgical History:  Procedure Laterality Date  . A/V FISTULAGRAM Left 07/21/2016   Procedure: A/V Fistulagram;  Surgeon: Algernon Huxley, MD;  Location: [place] [facility];  Service: Cardiovascular;  Laterality: Left;  . A/V FISTULAGRAM Left 12/24/2016   Procedure: A/V  Fistulagram;  Surgeon: Algernon Huxley, MD;  Location: [place] [facility];  Service: Cardiovascular;  Laterality: Left;  . A/V SHUNT INTERVENTION N/A 07/21/2016   Procedure: A/V Shunt Intervention;  Surgeon: Algernon Huxley, MD;  Location: [place] [facility];  Service: Cardiovascular;  Laterality: N/A;  . A/V SHUNT INTERVENTION N/A 12/24/2016   Procedure: A/V SHUNT INTERVENTION;  Surgeon: Algernon Huxley, MD;  Location: [place] [facility];  Service: Cardiovascular;  Laterality: N/A;  . AVF    . CHOLECYSTECTOMY    . ENDOMETRIAL BIOPSY     ablation, uterine  . HERNIA REPAIR    . HERNIA REPAIR    . PERIPHERAL VASCULAR CATHETERIZATION N/A 08/23/2015   Procedure: Dialysis/Perma Catheter Insertion;  Surgeon: Algernon Huxley, MD;  Location: [place] [facility];  Service: Cardiovascular;  Laterality: N/A;  . PERIPHERAL VASCULAR CATHETERIZATION N/A 11/09/2015   Procedure: Dialysis/Perma Catheter Removal;  Surgeon: Katha Cabal, MD;  Location: [place] [facility];  Service: Cardiovascular;  Laterality: N/A;  . PERIPHERAL VASCULAR CATHETERIZATION Left 04/23/2016   Procedure: A/V Fistulagram;  Surgeon: Algernon Huxley, MD;  Location: [place] [facility];  Service: Cardiovascular;  Laterality: Left;  . PORT A CATH REVISION      Prior to Admission medications   Medication Sig Start Date End Date Taking?  Authorizing Provider  amLODipine (NORVASC) 5 MG tablet Take 5 mg by mouth See admin instructions. Only takes on non-dialysis days 11/26/16 11/26/17  [provider]  aspirin 81 MG tablet Take 81 mg by mouth daily.     [provider]  b complex vitamins tablet Take 1 tablet by mouth every evening.     [provider]  carbamazepine (TEGRETOL-XR) 100 MG 12 hr tablet Two tablets in the morning and one tablet in the evening.  Please keep pending appt. on December 03, 2016 in order to receive future refills. Patient taking differently: Take 100-200 mg by mouth See admin instructions. Two tablets  in the morning and one tablet in the evening.  Please keep pending appt. on December 03, 2016 in order to receive future refills. Does not take on mornings of dialysis 12/03/16   Dohmeier, Asencion Partridge, MD  cefdinir (OMNICEF) 300 MG capsule Take 300 mg by mouth daily. 14 day course started 12-15-16    [provider]  chlorpheniramine-HYDROcodone (TUSSIONEX) 10-8 MG/5ML SUER Take 2.5 mLs by mouth every 12 (twelve) hours as needed for cough.    [provider]  clonazePAM (KLONOPIN) 1 MG tablet Take 1 tablet (1 mg total) by mouth at bedtime. 11/19/16   Dohmeier, Asencion Partridge, MD  cyanocobalamin (,VITAMIN B-12,) 1000 MCG/ML injection Inject 1,000 mcg into the muscle every 14 (fourteen) days.     [provider]  Docusate Calcium (STOOL SOFTENER PO) Take 1 capsule by mouth every morning.     [provider]  ethosuximide (ZARONTIN) 250 MG/5ML solution TAKE 5ML BY MOUTH 2 TIMES DAILY 01/22/17   Dohmeier, Asencion Partridge, MD  Febuxostat (ULORIC) 80 MG TABS Take 1 tablet by mouth daily. Morning dosage     [provider]  fexofenadine (ALLEGRA) 180 MG tablet Take 180 mg by mouth daily. am    [provider]  Fluticasone-Salmeterol (ADVAIR DISKUS) 250-50 MCG/DOSE AEPB Inhale 1 puff into the lungs 2 (two) times daily.    [provider]  folic acid-vitamin b complex-vitamin c-selenium-zinc (DIALYVITE) 3 MG TABS tablet Take 1 tablet by mouth daily.    [provider]  furosemide (LASIX) 20 MG tablet Take 30 mg by mouth 2 (two) times daily. Takes 1.5 tablet    [provider]  glucose blood (ONE TOUCH ULTRA TEST) test strip Use 4 (four) times daily. Test 3 - 4 times daily E11.21 06/24/16   [provider]  GuaiFENesin (MUCINEX PO) Take 1 tablet by mouth 2 (two) times daily as needed. Expectorant, as needed     [provider]  hydrALAZINE (APRESOLINE) 100 MG tablet Take 100 mg by mouth 3 (three) times daily. No morning dose on dialysis days     [provider]  insulin glargine (LANTUS) 100 UNIT/ML injection Inject 26 Units into the skin at bedtime.     [provider]  insulin lispro (HUMALOG) 100 UNIT/ML injection Inject 8-16 Units into the skin 2 (two) times daily with a meal. Sliding scale takes in sugars are over 200-299 2 units, 300-399 3 units, over 400 4 units.     [provider]  Insulin Pen Needle (BD PEN NEEDLE NANO U/F) 32G X 4 MM MISC  10/22/15   [provider]  lamoTRIgine (LAMICTAL) 200 MG tablet Take 1 tablet (200 mg total) by mouth 2 (two) times daily. Patient taking differently: Take 300 mg by mouth 2 (two) times daily. Takes 1.5 tablet.  No generics must be glaxo brand others  cause grand mal seizures 12/03/16   Dohmeier, Asencion Partridge, MD  levothyroxine (SYNTHROID) 150 MCG tablet Take 150 mcg by mouth daily before breakfast. Am dosage     [provider]  metoprolol succinate (TOPROL XL) 50 MG 24 hr tablet Take 1 tablet (50 mg total) by mouth 2 (two) times daily. Take with or immediately following a meal. Patient taking differently: Take 75 mg by mouth 2 (two) times daily. Take with or immediately following a meal. Does not take on mornings of dialysis 04/16/16   Dohmeier, Asencion Partridge, MD  montelukast (SINGULAIR) 10 MG tablet Take 10 mg by mouth at bedtime.    [provider]  nystatin-triamcinolone (MYCOLOG II) cream Apply 1 application topically 2 (two) times daily. Patient taking differently: Apply 1 application topically daily as needed (rash).  10/22/16   Defrancesco, Alanda Slim, MD  pantoprazole (PROTONIX) 40 MG tablet Take 40 mg by mouth every morning.     [provider]  predniSONE (DELTASONE) 10 MG tablet Take 10-60 mg by mouth daily with breakfast. Taper dose starting 12-15-16    [provider]  Probiotic Product (ALIGN) 4 MG CAPS Take 4 mg by mouth daily.     [provider]  sevelamer carbonate (RENVELA) 800 MG tablet Take 3,200 mg by mouth 3  (three) times daily with meals.     [provider]  triamcinolone (NASACORT ALLERGY 24HR) 55 MCG/ACT AERO nasal inhaler Place 2 sprays into the nose at bedtime.     [provider]    Allergies Cinnamon; Procrit [epoetin alfa]; Ace inhibitors; Azithromycin; Iron; Neomycin-bacitracin zn-polymyx; and Sulfa antibiotics  Family History  Problem Relation Age of Onset  . Diabetes Mother   . Lumbar disc disease Mother   . Migraines Mother   . Hypertension Mother   . Cancer - Prostate Father        mild  . Diabetes Father   . Hypertension Father   . Breast cancer Neg Hx   . Ovarian cancer Neg Hx   . Colon cancer Neg Hx   . Heart disease Neg Hx   . Kidney cancer Neg Hx   . Bladder Cancer Neg Hx     Social History Social History  Substance Use Topics  . Smoking status: Never Smoker  . Smokeless tobacco: Never Used  . Alcohol use No    Review of Systems Constitutional: No fever/chills Eyes: No visual changes. ENT: No sore throat. Cardiovascular: Denies chest pain. Respiratory: Denies shortness of breath. Gastrointestinal: No abdominal pain.  Positive for rectal bleeding. Genitourinary: Negative for dysuria. Musculoskeletal: Negative for back pain. Skin: Negative for rash. Neurological: Negative for headaches, focal weakness or numbness.  ____________________________________________   PHYSICAL EXAM:  VITAL SIGNS: ED Triage Vitals  Enc Vitals Group     BP 02/23/17 1449 (!) 137/52     Pulse Rate 02/23/17 1449 72     Resp 02/23/17 1449 20     Temp 02/23/17 1449 99.3 F (37.4 C)     Temp Source 02/23/17 1449 Oral     SpO2 02/23/17 1449 99 %     Weight 02/23/17 1451 205 lb (93 kg)     Height 02/23/17 1451 5' 2.5" (1.588 m)     Head Circumference --      Peak Flow --      Pain Score 02/23/17 1450 0     Pain Loc --      Pain Edu? --      Excl. in [place]? --  Constitutional: Alert and oriented. Well appearing and in no distress. Eyes:  Conjunctivae are normal.  ENT   Head: Normocephalic and atraumatic.   Nose: No congestion/rhinnorhea.   Mouth/Throat: Mucous membranes are moist.   Neck: No stridor. Hematological/Lymphatic/Immunilogical: No cervical lymphadenopathy. Cardiovascular: Normal rate, regular rhythm.  No murmurs, rubs, or gallops.  Respiratory: Normal respiratory effort without tachypnea nor retractions. Breath sounds are clear and equal bilaterally. No wheezes/rales/rhonchi. Gastrointestinal: Soft and non tender. No rebound. No guarding.  Rectal: No red blood on glove. No melena. Weakly positive GUIAC. Musculoskeletal: Normal range of motion in all extremities.  Neurologic:  Normal speech and language. No gross focal neurologic deficits are appreciated.  Skin:  Skin is warm, dry and intact. No rash noted. Psychiatric: Mood and affect are normal. Speech and behavior are normal. Patient exhibits appropriate insight and judgment.  ____________________________________________    LABS (pertinent positives/negatives)  CBC hgb 8.1 CMP glu 214, Cr 6.07 ____________________________________________   EKG  None  ____________________________________________    RADIOLOGY  None  ____________________________________________   PROCEDURES  Procedures  ____________________________________________   INITIAL IMPRESSION / ASSESSMENT AND PLAN / ED COURSE  Pertinent labs & imaging results that were available during my care of the patient were reviewed by me and considered in my medical decision making (see chart for details).  Patient presents with 2 episodes of BRBPR over the past three days. ddx includes hemorrhoids, anal fissure, AVM, diverticular disease, cancer amongst other etiologies. No bleeding today. No bright red blood on glove. Hgb roughly at patients baseline. Patient states she will get her hgb checked tomorrow at dialysis. Given lack of bleeding today, baseline hgb and normal vital  signs think patient is safe to continue outpatient work up. Discussed return precautions with patient.  ____________________________________________   FINAL CLINICAL IMPRESSION(S) / ED DIAGNOSES  Final diagnoses:  Lower GI bleed     Note: This dictation was prepared with Dragon dictation. Any transcriptional errors that result from this process are unintentional     Nance Pear, MD 02/23/17 1816

## 2017-02-23 NOTE — ED Notes (Signed)
NAD noted at time of D/C. Pt denies questions or concerns. Pt ambulatory to the lobby at this time.  

## 2017-02-24 ENCOUNTER — Telehealth: Payer: Self-pay | Admitting: Neurology

## 2017-02-24 NOTE — Telephone Encounter (Signed)
Patient's mother is calling to discuss the patient having a colonoscopy and endoscopy due to rectal bleeding over the weekend. Is it alright for her to have these procedures due to her having seizures. Please call and discuss. She can be reached at [phone] or [phone].

## 2017-02-24 NOTE — Telephone Encounter (Signed)
Called patients parents to let them know that Dr Brett Fairy has given the ok for the patient to have these procedures. No answer at this time. LVM stated that the patient could have these test done as instructed by Dr Brett Fairy. If they have any questions they may call back

## 2017-03-16 ENCOUNTER — Ambulatory Visit
Admission: RE | Admit: 2017-03-16 | Discharge: 2017-03-16 | Disposition: A | Discharge status: Home / Self Care | Payer: Medicare Other | Source: Ambulatory Visit | Attending: Nephrology | Admitting: Nephrology

## 2017-03-16 DIAGNOSIS — D649 Anemia, unspecified: Secondary | ICD-10-CM | POA: Diagnosis present

## 2017-03-16 LAB — HEMOGLOBIN AND HEMATOCRIT, BLOOD
HCT: 22.2 % — ABNORMAL LOW (ref 35.0–47.0)
Hemoglobin: 7.4 g/dL — ABNORMAL LOW (ref 12.0–16.0)

## 2017-03-16 LAB — PREPARE RBC (CROSSMATCH)

## 2017-03-16 MED ORDER — SODIUM CHLORIDE 0.9 % IV SOLN
INTRAVENOUS | Status: DC
  Administered 2017-03-16: 11:00:00 via INTRAVENOUS

## 2017-03-17 LAB — TYPE AND SCREEN
ABO/RH(D): O POS
Antibody Screen: NEGATIVE
Unit division: 0

## 2017-03-17 LAB — BPAM RBC
Blood Product Expiration Date: 201811152359
ISSUE DATE / TIME: 201811121201
Unit Type and Rh: 9500

## 2017-03-18 ENCOUNTER — Encounter: Payer: Self-pay | Admitting: Anesthesiology

## 2017-03-18 ENCOUNTER — Encounter
Admission: RE | Disposition: A | Discharge status: Home / Self Care | Payer: Self-pay | Source: Ambulatory Visit | Attending: Unknown Physician Specialty

## 2017-03-18 ENCOUNTER — Ambulatory Visit: Payer: Medicare Other | Admitting: Anesthesiology

## 2017-03-18 ENCOUNTER — Ambulatory Visit
Admission: RE | Admit: 2017-03-18 | Discharge: 2017-03-18 | Disposition: A | Discharge status: Home / Self Care | Payer: Medicare Other | Source: Ambulatory Visit | Attending: Unknown Physician Specialty | Admitting: Unknown Physician Specialty

## 2017-03-18 DIAGNOSIS — I110 Hypertensive heart disease with heart failure: Secondary | ICD-10-CM | POA: Diagnosis not present

## 2017-03-18 DIAGNOSIS — E785 Hyperlipidemia, unspecified: Secondary | ICD-10-CM | POA: Insufficient documentation

## 2017-03-18 DIAGNOSIS — K317 Polyp of stomach and duodenum: Secondary | ICD-10-CM | POA: Insufficient documentation

## 2017-03-18 DIAGNOSIS — N189 Chronic kidney disease, unspecified: Secondary | ICD-10-CM | POA: Insufficient documentation

## 2017-03-18 DIAGNOSIS — K449 Diaphragmatic hernia without obstruction or gangrene: Secondary | ICD-10-CM | POA: Insufficient documentation

## 2017-03-18 DIAGNOSIS — K589 Irritable bowel syndrome without diarrhea: Secondary | ICD-10-CM | POA: Insufficient documentation

## 2017-03-18 DIAGNOSIS — I509 Heart failure, unspecified: Secondary | ICD-10-CM | POA: Diagnosis not present

## 2017-03-18 DIAGNOSIS — F419 Anxiety disorder, unspecified: Secondary | ICD-10-CM | POA: Insufficient documentation

## 2017-03-18 DIAGNOSIS — N3281 Overactive bladder: Secondary | ICD-10-CM | POA: Diagnosis not present

## 2017-03-18 DIAGNOSIS — K5730 Diverticulosis of large intestine without perforation or abscess without bleeding: Secondary | ICD-10-CM | POA: Diagnosis not present

## 2017-03-18 DIAGNOSIS — I739 Peripheral vascular disease, unspecified: Secondary | ICD-10-CM | POA: Insufficient documentation

## 2017-03-18 DIAGNOSIS — J449 Chronic obstructive pulmonary disease, unspecified: Secondary | ICD-10-CM | POA: Diagnosis not present

## 2017-03-18 DIAGNOSIS — E0590 Thyrotoxicosis, unspecified without thyrotoxic crisis or storm: Secondary | ICD-10-CM | POA: Diagnosis not present

## 2017-03-18 DIAGNOSIS — K219 Gastro-esophageal reflux disease without esophagitis: Secondary | ICD-10-CM | POA: Diagnosis not present

## 2017-03-18 DIAGNOSIS — F319 Bipolar disorder, unspecified: Secondary | ICD-10-CM | POA: Diagnosis not present

## 2017-03-18 DIAGNOSIS — M109 Gout, unspecified: Secondary | ICD-10-CM | POA: Diagnosis not present

## 2017-03-18 DIAGNOSIS — K2950 Unspecified chronic gastritis without bleeding: Secondary | ICD-10-CM | POA: Insufficient documentation

## 2017-03-18 DIAGNOSIS — M1990 Unspecified osteoarthritis, unspecified site: Secondary | ICD-10-CM | POA: Insufficient documentation

## 2017-03-18 DIAGNOSIS — Z79899 Other long term (current) drug therapy: Secondary | ICD-10-CM | POA: Insufficient documentation

## 2017-03-18 DIAGNOSIS — Z87442 Personal history of urinary calculi: Secondary | ICD-10-CM | POA: Insufficient documentation

## 2017-03-18 DIAGNOSIS — E1122 Type 2 diabetes mellitus with diabetic chronic kidney disease: Secondary | ICD-10-CM | POA: Diagnosis not present

## 2017-03-18 DIAGNOSIS — G4730 Sleep apnea, unspecified: Secondary | ICD-10-CM | POA: Insufficient documentation

## 2017-03-18 DIAGNOSIS — Z794 Long term (current) use of insulin: Secondary | ICD-10-CM | POA: Insufficient documentation

## 2017-03-18 DIAGNOSIS — K625 Hemorrhage of anus and rectum: Secondary | ICD-10-CM | POA: Diagnosis not present

## 2017-03-18 DIAGNOSIS — K640 First degree hemorrhoids: Secondary | ICD-10-CM | POA: Diagnosis not present

## 2017-03-18 DIAGNOSIS — R195 Other fecal abnormalities: Secondary | ICD-10-CM | POA: Insufficient documentation

## 2017-03-18 DIAGNOSIS — I2510 Atherosclerotic heart disease of native coronary artery without angina pectoris: Secondary | ICD-10-CM | POA: Diagnosis not present

## 2017-03-18 DIAGNOSIS — I471 Supraventricular tachycardia: Secondary | ICD-10-CM | POA: Insufficient documentation

## 2017-03-18 DIAGNOSIS — Z992 Dependence on renal dialysis: Secondary | ICD-10-CM | POA: Insufficient documentation

## 2017-03-18 DIAGNOSIS — E875 Hyperkalemia: Secondary | ICD-10-CM | POA: Insufficient documentation

## 2017-03-18 DIAGNOSIS — D510 Vitamin B12 deficiency anemia due to intrinsic factor deficiency: Secondary | ICD-10-CM | POA: Insufficient documentation

## 2017-03-18 HISTORY — DX: Uterovaginal prolapse, unspecified: N81.4

## 2017-03-18 HISTORY — PX: ESOPHAGOGASTRODUODENOSCOPY (EGD) WITH PROPOFOL: SHX5813

## 2017-03-18 HISTORY — DX: Supraventricular tachycardia, unspecified: I47.10

## 2017-03-18 HISTORY — DX: Plantar fascial fibromatosis: M72.2

## 2017-03-18 HISTORY — DX: Atherosclerotic heart disease of native coronary artery without angina pectoris: I25.10

## 2017-03-18 HISTORY — DX: Calculus of kidney: N20.0

## 2017-03-18 HISTORY — DX: Supraventricular tachycardia: I47.1

## 2017-03-18 HISTORY — DX: Angina pectoris, unspecified: I20.9

## 2017-03-18 HISTORY — DX: Unspecified asthma, uncomplicated: J45.909

## 2017-03-18 HISTORY — PX: COLONOSCOPY WITH PROPOFOL: SHX5780

## 2017-03-18 HISTORY — DX: Menopausal and female climacteric states: N95.1

## 2017-03-18 HISTORY — DX: Postconcussional syndrome: F07.81

## 2017-03-18 HISTORY — DX: Hepatomegaly, not elsewhere classified: R16.0

## 2017-03-18 HISTORY — DX: Heart failure, unspecified: I50.9

## 2017-03-18 HISTORY — DX: Incomplete uterovaginal prolapse: N81.2

## 2017-03-18 HISTORY — DX: Irritable bowel syndrome, unspecified: K58.9

## 2017-03-18 HISTORY — DX: Gout, unspecified: M10.9

## 2017-03-18 HISTORY — DX: Arteriovenous fistula, acquired: I77.0

## 2017-03-18 HISTORY — DX: Unspecified osteoarthritis, unspecified site: M19.90

## 2017-03-18 LAB — GLUCOSE, CAPILLARY: Glucose-Capillary: 167 mg/dL — ABNORMAL HIGH (ref 65–99)

## 2017-03-18 SURGERY — COLONOSCOPY WITH PROPOFOL
Anesthesia: General

## 2017-03-18 MED ORDER — PHENYLEPHRINE HCL 10 MG/ML IJ SOLN
INTRAMUSCULAR | Status: DC | PRN
  Administered 2017-03-18 (×3): 100 ug via INTRAVENOUS

## 2017-03-18 MED ORDER — PROPOFOL 500 MG/50ML IV EMUL
INTRAVENOUS | Status: AC
  Filled 2017-03-18: qty 50

## 2017-03-18 MED ORDER — SODIUM CHLORIDE 0.9 % IV SOLN: INTRAVENOUS | Status: DC

## 2017-03-18 MED ORDER — PROPOFOL 500 MG/50ML IV EMUL
INTRAVENOUS | Status: DC | PRN
  Administered 2017-03-18: 140 ug/kg/min via INTRAVENOUS

## 2017-03-18 MED ORDER — LIDOCAINE HCL (PF) 1 % IJ SOLN
INTRAMUSCULAR | Status: AC
  Administered 2017-03-18: 0.3 mL via INTRADERMAL
  Filled 2017-03-18: qty 2

## 2017-03-18 MED ORDER — LIDOCAINE HCL (PF) 1 % IJ SOLN
2.0000 mL | Freq: Once | INTRAMUSCULAR | Status: AC
  Administered 2017-03-18: 0.3 mL via INTRADERMAL

## 2017-03-18 MED ORDER — LIDOCAINE HCL (PF) 2 % IJ SOLN
INTRAMUSCULAR | Status: AC
  Filled 2017-03-18: qty 10

## 2017-03-18 MED ORDER — SODIUM CHLORIDE 0.9 % IV SOLN
INTRAVENOUS | Status: DC
  Administered 2017-03-18: 1000 mL via INTRAVENOUS

## 2017-03-18 MED ORDER — FENTANYL CITRATE (PF) 100 MCG/2ML IJ SOLN
INTRAMUSCULAR | Status: AC
  Filled 2017-03-18: qty 4

## 2017-03-18 MED ORDER — SODIUM CHLORIDE 0.9 % IV SOLN
INTRAVENOUS | Status: DC | PRN
  Administered 2017-03-18: 08:00:00 via INTRAVENOUS

## 2017-03-18 MED ORDER — PROPOFOL 10 MG/ML IV BOLUS
INTRAVENOUS | Status: AC
  Filled 2017-03-18: qty 20

## 2017-03-18 MED ORDER — PROPOFOL 10 MG/ML IV BOLUS
INTRAVENOUS | Status: DC | PRN
  Administered 2017-03-18: 100 mg via INTRAVENOUS

## 2017-03-18 MED ORDER — FENTANYL CITRATE (PF) 100 MCG/2ML IJ SOLN
INTRAMUSCULAR | Status: DC | PRN
  Administered 2017-03-18 (×2): 50 ug via INTRAVENOUS

## 2017-03-18 MED ORDER — LIDOCAINE 2% (20 MG/ML) 5 ML SYRINGE
INTRAMUSCULAR | Status: DC | PRN
  Administered 2017-03-18: 40 mg via INTRAVENOUS

## 2017-03-18 NOTE — Anesthesia Postprocedure Evaluation (Signed)
Anesthesia Post Note  Patient: Nina Johnston [facility]  Procedure(s) Performed: COLONOSCOPY WITH PROPOFOL (N/A ) ESOPHAGOGASTRODUODENOSCOPY (EGD) WITH PROPOFOL (N/A )  Patient location during evaluation: Endoscopy Anesthesia Type: General Level of consciousness: awake and alert Pain management: pain level controlled Vital Signs Assessment: post-procedure vital signs reviewed and stable Respiratory status: spontaneous breathing, nonlabored ventilation, respiratory function stable and patient connected to nasal cannula oxygen Cardiovascular status: blood pressure returned to baseline and stable Postop Assessment: no apparent nausea or vomiting Anesthetic complications: no     Last Vitals:  Vitals:   03/18/17 0900 03/18/17 0910  BP: (!) 119/58 (!) 131/59  Pulse: 76 66  Resp: 13 18  Temp:    SpO2: 100% 97%    Last Pain:  Vitals:   03/18/17 0910  TempSrc:   PainSc: 0-No pain                 Dwanna Goshert S

## 2017-03-18 NOTE — Op Note (Signed)
[facility] Patient Name: Nina Johnston Procedure Date: 03/18/2017 7:46 AM MRN: [id number] Account #: [id number] Date of Birth: 12/04/1960 Admit Type: Outpatient Age: 56 Room: [facility] ENDO ROOM 3 Gender: Female Note Status: Finalized Procedure:            Colonoscopy Indications:          Heme positive stool Providers:            Manya Silvas, MD Referring MD:         Leonie Douglas. Doy Hutching, MD (Referring MD) Medicines:            Propofol per Anesthesia Complications:        No immediate complications. Procedure:            Pre-Anesthesia Assessment:                       - After reviewing the risks and benefits, the patient                        was deemed in satisfactory condition to undergo the                        procedure.                       After obtaining informed consent, the colonoscope was                        passed under direct vision. Throughout the procedure,                        the patient's blood pressure, pulse, and oxygen                        saturations were monitored continuously. The                        Colonoscope was introduced through the anus and                        advanced to the the cecum, identified by appendiceal                        orifice and ileocecal valve. The colonoscopy was                        performed without difficulty. The patient tolerated the                        procedure well. The quality of the bowel preparation                        was good. Findings:      A small polyp was found in the hepatic flexure. The polyp was sessile.       The polyp was removed with a hot snare. Resection and retrieval were       complete. To prevent bleeding after the polypectomy, one hemostatic clip       was successfully placed. There was no bleeding during, or at the end, of       the procedure.  A small-medium polyp was found in the ascending colon. The polyp was       sessile. The  polyp was removed with a hot snare. Resection and retrieval       were complete. To prevent bleeding after the polypectomy, one hemostatic       clip was successfully placed. There was no bleeding during, or at the       end, of the procedure.      Many small-mouthed diverticula were found in the sigmoid colon,       descending colon, transverse colon and ascending colon.      Internal hemorrhoids were found during endoscopy. The hemorrhoids were       small and Grade I (internal hemorrhoids that do not prolapse). Impression:           - One small polyp at the hepatic flexure, removed with                        a hot snare. Resected and retrieved. Clip was placed.                       - One small polyp in the ascending colon, removed with                        a hot snare. Resected and retrieved. Clip was placed.                       - Diverticulosis in the sigmoid colon, in the                        descending colon, in the transverse colon and in the                        ascending colon.                       - Internal hemorrhoids. Recommendation:       - Await pathology results. Manya Silvas, MD 03/18/2017 8:48:03 AM This report has been signed electronically. Number of Addenda: 0 Note Initiated On: 03/18/2017 7:46 AM Scope Withdrawal Time: 0 hours 12 minutes 34 seconds  Total Procedure Duration: 0 hours 21 minutes 44 seconds       [facility]

## 2017-03-18 NOTE — Anesthesia Post-op Follow-up Note (Signed)
Anesthesia QCDR form completed.        

## 2017-03-18 NOTE — Op Note (Signed)
[facility] Patient Name: Nina Johnston Procedure Date: 03/18/2017 7:46 AM MRN: [id number] Account #: [id number] Date of Birth: [birth year] Admit Type: Outpatient Age: 56 Room: [facility] ENDO ROOM 3 Gender: Female Note Status: Finalized Procedure:            Upper GI endoscopy Indications:          Heme positive stool Providers:            Manya Silvas, MD Referring MD:         Kimberlee Nearing. Alvin Critchley, MD (Referring MD) Medicines:            Propofol per Anesthesia Complications:        No immediate complications. Procedure:            Pre-Anesthesia Assessment:                       - After reviewing the risks and benefits, the patient                        was deemed in satisfactory condition to undergo the                        procedure.                       After obtaining informed consent, the endoscope was                        passed under direct vision. Throughout the procedure,                        the patient's blood pressure, pulse, and oxygen                        saturations were monitored continuously. The Endoscope                        was introduced through the mouth, and advanced to the                        second part of duodenum. The upper GI endoscopy was                        accomplished without difficulty. The patient tolerated                        the procedure well. Findings:      The examined esophagus was normal. GEJ 38cm, 2-3cm hiatal hernia seen.      A small hiatal hernia was present.      Localized mild inflammation characterized by congestion (edema) and       erythema was found in the gastric antrum. Biopsies were taken with a       cold forceps for histology.      A single 10 mm sessile polyp with no bleeding and no stigmata of recent       bleeding was found in the gastric body. Biopsies were taken with a cold       forceps for histology. Some bleeding followed but this stopped after a       few  minutes.      The  examined duodenum was normal. Impression:           - Normal esophagus.                       - Small hiatal hernia.                       - Gastritis. Biopsied.                       - A single gastric polyp. Biopsied.                       - Normal examined duodenum. Recommendation:       - Await pathology results. Colonoscopy. Manya Silvas, MD 03/18/2017 8:19:10 AM This report has been signed electronically. Number of Addenda: 0 Note Initiated On: 03/18/2017 7:46 AM      [facility]

## 2017-03-18 NOTE — H&P (Signed)
Primary Care Physician:  Idelle Crouch, MD Primary Gastroenterologist:  Dr. Vira Agar  Pre-Procedure History & Physical: HPI:  Nina Johnston is a 56 y.o. female is here for an endoscopy and colonoscopy.   Past Medical History:  Diagnosis Date  . A-V fistula ([place])   . Acute bacterial sinusitis 04/19/2014  . Adaptive colitis   . Anginal pain ([place])   . Anxiety   . Anxiety and depression   . Arthritis   . Asthma   . 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 Benign essential HTN 12/26/2014  . Bipolar disorder ([place])   . Bronchitis   . Carotid artery narrowing 12/22/2014  . Cervical prolapse   . CHF (congestive heart failure) ([place])   . CKD (chronic kidney disease)   . Concussion 01-14-14   Fall   . COPD (chronic obstructive pulmonary disease) ([place])   . Coronary artery disease   . Depression   . Diabetes mellitus without complication ([place])   . Dialysis patient ([place])   . Diplopia   . Dupuytren's contracture of foot   . Essential (primary) hypertension 02/12/2015  . Family history of thyroid problem   . GERD (gastroesophageal reflux disease)   . Gout   . Heart disease    enlarged because of COPD  . Heart murmur   . Hepatomegaly   . HLD (hyperlipidemia)   . Hyperactive airway disease   . Hyperkalemia   . Hypertension   . Hyperthyroidism   . Irritable colon   . Morbid obesity ([place])   . Multiple thyroid nodules   . OAB (overactive bladder)   . Pernicious anemia   . Post menopausal syndrome   . Post-concussion syndrome 03/08/2014  . Post-traumatic brain syndrome   . Prolapsed uterus   . PSVT (paroxysmal supraventricular tachycardia) ([place])   . Renal calculus   . Renal disease    w/ GFR 29-may be due to diabetes  . Seizures ([place])    last seizure 2012. has had spells since with no knowledge. last was 1 month ago  . Sleep apnea   . 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 Subdural hemorrhage due to  birth trauma   . Transient alteration of awareness 11/10/2012  . Urinary incontinence with continuous leakage 03/08/2014    Past Surgical History:  Procedure Laterality Date  . AVF    . CHOLECYSTECTOMY    . COLONOSCOPY    . ENDOMETRIAL BIOPSY     ablation, uterine  . HERNIA REPAIR    . HERNIA REPAIR    . PORT A CATH REVISION      Prior to Admission medications   Medication Sig Start Date End Date Taking? Authorizing Provider  solifenacin (VESICARE) 5 MG tablet Take 5 mg daily by mouth.   Yes [provider]  amLODipine (NORVASC) 5 MG tablet Take 5 mg by mouth See admin instructions. Only takes on non-dialysis days 11/26/16 11/26/17  [provider]  aspirin 81 MG tablet Take 81 mg by mouth daily.     [provider]  b complex vitamins tablet Take 1 tablet by mouth every evening.     [provider]  carbamazepine (TEGRETOL-XR) 100 MG 12 hr tablet Two tablets in the morning and one tablet in the evening.  Please keep pending appt. on December 03, 2016 in order to receive future refills. Patient taking differently: Take 100-200 mg by mouth  See admin instructions. Two tablets in the morning and one tablet in the evening.  Please keep pending appt. on December 03, 2016 in order to receive future refills. Does not take on mornings of dialysis 12/03/16   Dohmeier, Asencion Partridge, MD  cefdinir (OMNICEF) 300 MG capsule Take 300 mg by mouth daily. 14 day course started 12-15-16    [provider]  chlorpheniramine-HYDROcodone (TUSSIONEX) 10-8 MG/5ML SUER Take 2.5 mLs by mouth every 12 (twelve) hours as needed for cough.    [provider]  clonazePAM (KLONOPIN) 1 MG tablet Take 1 tablet (1 mg total) by mouth at bedtime. 11/19/16   Dohmeier, Asencion Partridge, MD  cyanocobalamin (,VITAMIN B-12,) 1000 MCG/ML injection Inject 1,000 mcg into the muscle every 14 (fourteen) days.     [provider]  Docusate Calcium (STOOL SOFTENER PO) Take 1 capsule by mouth every  morning.     [provider]  ethosuximide (ZARONTIN) 250 MG/5ML solution TAKE 5ML BY MOUTH 2 TIMES DAILY 01/22/17   Dohmeier, Asencion Partridge, MD  Febuxostat (ULORIC) 80 MG TABS Take 1 tablet by mouth daily. Morning dosage     [provider]  fexofenadine (ALLEGRA) 180 MG tablet Take 180 mg by mouth daily. am    [provider]  Fluticasone-Salmeterol (ADVAIR DISKUS) 250-50 MCG/DOSE AEPB Inhale 1 puff into the lungs 2 (two) times daily.    [provider]  folic acid-vitamin b complex-vitamin c-selenium-zinc (DIALYVITE) 3 MG TABS tablet Take 1 tablet by mouth daily.    [provider]  furosemide (LASIX) 20 MG tablet Take 30 mg by mouth 2 (two) times daily. Takes 1.5 tablet    [provider]  glucose blood (ONE TOUCH ULTRA TEST) test strip Use 4 (four) times daily. Test 3 - 4 times daily E11.21 06/24/16   [provider]  GuaiFENesin (MUCINEX PO) Take 1 tablet by mouth 2 (two) times daily as needed. Expectorant, as needed     [provider]  hydrALAZINE (APRESOLINE) 100 MG tablet Take 100 mg by mouth 3 (three) times daily. No morning dose on dialysis days    [provider]  insulin glargine (LANTUS) 100 UNIT/ML injection Inject 50 Units at bedtime into the skin.     [provider]  insulin lispro (HUMALOG) 100 UNIT/ML injection Inject 8-16 Units into the skin 2 (two) times daily with a meal. Sliding scale takes in sugars are over 200-299 2 units, 300-399 3 units, over 400 4 units.     [provider]  Insulin Pen Needle (BD PEN NEEDLE NANO U/F) 32G X 4 MM MISC  10/22/15   [provider]  lamoTRIgine (LAMICTAL) 200 MG tablet Take 1 tablet (200 mg total) by mouth 2 (two) times daily. Patient taking differently: Take 300 mg by mouth 2 (two) times daily. Takes 1.5 tablet.  No generics must be [employer] brand others cause grand mal seizures 12/03/16   Dohmeier, Asencion Partridge, MD  levothyroxine (SYNTHROID) 150 MCG  tablet Take 150 mcg by mouth daily before breakfast. Am dosage     [provider]  metoprolol succinate (TOPROL XL) 50 MG 24 hr tablet Take 1 tablet (50 mg total) by mouth 2 (two) times daily. Take with or immediately following a meal. Patient taking differently: Take 75 mg by mouth 2 (two) times daily. Take with or immediately following a meal. Does not take on mornings of dialysis 04/16/16   Dohmeier, Asencion Partridge, MD  montelukast (SINGULAIR) 10 MG tablet Take 10 mg by mouth at  bedtime.    [provider]  nystatin-triamcinolone (MYCOLOG II) cream Apply 1 application topically 2 (two) times daily. Patient taking differently: Apply 1 application topically daily as needed (rash).  10/22/16   Defrancesco, Alanda Slim, MD  pantoprazole (PROTONIX) 40 MG tablet Take 40 mg by mouth every morning.     [provider]  predniSONE (DELTASONE) 10 MG tablet Take 10-60 mg by mouth daily with breakfast. Taper dose starting 12-15-16    [provider]  Probiotic Product (ALIGN) 4 MG CAPS Take 4 mg by mouth daily.     [provider]  sevelamer carbonate (RENVELA) 800 MG tablet Take 3,200 mg by mouth 3 (three) times daily with meals.     [provider]  triamcinolone (NASACORT ALLERGY 24HR) 55 MCG/ACT AERO nasal inhaler Place 2 sprays into the nose at bedtime.     [provider]    Allergies as of 02/25/2017 - Review Complete 02/23/2017  Allergen Reaction Noted  . Cinnamon Other (See Comments) 11/09/2012  . Procrit [epoetin alfa]  11/10/2012  . Ace inhibitors Cough 09/06/2014  . Azithromycin Other (See Comments) 09/06/2014  . Iron  03/31/2016  . Neomycin-bacitracin zn-polymyx Other (See Comments) 09/06/2014  . Sulfa antibiotics  09/06/2014    Family History  Problem Relation Age of Onset  . Diabetes Mother   . Lumbar disc disease Mother   . Migraines Mother   . Hypertension Mother   . Cancer - Prostate Father        mild  . Diabetes Father    . Hypertension Father   . Breast cancer Neg Hx   . Ovarian cancer Neg Hx   . Colon cancer Neg Hx   . Heart disease Neg Hx   . Kidney cancer Neg Hx   . Bladder Cancer Neg Hx     Social History   Socioeconomic History  . Marital status: Single    Spouse name: Not on file  . Number of children: 0  . Years of education:  college  . Highest education level: Not on file  Social Needs  . Financial resource strain: Not on file  . Food insecurity - worry: Not on file  . Food insecurity - inability: Not on file  . Transportation needs - medical: Not on file  . Transportation needs - non-medical: Not on file  Occupational History  . Occupation: not employed  Tobacco Use  . Smoking status: Never Smoker  . Smokeless tobacco: Never Used  Substance and Sexual Activity  . Alcohol use: No  . Drug use: No  . Sexual activity: Not Currently  Other Topics Concern  . Not on file  Social History Narrative   Patient is single and lives with her parents.   Patient is disabled.   Patient has a college education.   Patient is right- handed.   Patient drinks tea occasionally. 2 glasses of tea when they eat out.    Review of Systems: See HPI, otherwise negative ROS  Physical Exam: BP (!) 140/51   Pulse 80   Temp (!) 96.8 F (36 C) (Tympanic)   Resp 16   Ht 5\' 2"  (1.575 m)   Wt 93.9 kg (207 lb)   SpO2 100%   BMI 37.86 kg/m  General:   Alert,  pleasant and cooperative in NAD Head:  Normocephalic and atraumatic. Neck:  Supple; no masses or thyromegaly. Lungs:  Clear throughout to auscultation.    Heart:  Regular rate and rhythm. Abdomen:  Soft, nontender and nondistended. Normal bowel sounds, without guarding, and without rebound.   Neurologic:  Alert and  oriented x4;  grossly normal neurologically.  Impression/Plan: Nina Johnston is here for an endoscopy and colonoscopy to be performed for [facility] colon polyps and heme positive stool.  Risks, benefits, limitations, and  alternatives regarding  endoscopy and colonoscopy have been reviewed with the patient.  Questions have been answered.  All parties agreeable.   Gaylyn Cheers, MD  03/18/2017, 7:47 AM

## 2017-03-18 NOTE — Anesthesia Preprocedure Evaluation (Signed)
Anesthesia Evaluation  Patient identified by MRN, date of birth, ID band Patient awake    Reviewed: Allergy & Precautions, NPO status , Patient's Chart, lab work & pertinent test results, reviewed documented beta blocker date and time   Airway Mallampati: III  TM Distance: >3 FB     Dental  (+) Chipped   Pulmonary asthma , sleep apnea , COPD,           Cardiovascular hypertension, Pt. on medications and Pt. on home beta blockers + angina + CAD, + Peripheral Vascular Disease and +CHF       Neuro/Psych Seizures -,  PSYCHIATRIC DISORDERS Anxiety Depression Bipolar Disorder  Neuromuscular disease    GI/Hepatic GERD  ,  Endo/Other  diabetes, Type 2Hypothyroidism Morbid obesity  Renal/GU ESRFRenal disease     Musculoskeletal  (+) Arthritis ,   Abdominal   Peds  Hematology  (+) anemia ,   Anesthesia Other Findings Hx of SVT.Gout.  Reproductive/Obstetrics                             Anesthesia Physical Anesthesia Plan  ASA: III  Anesthesia Plan: General   Post-op Pain Management:    Induction: Intravenous  PONV Risk Score and Plan:   Airway Management Planned:   Additional Equipment:   Intra-op Plan:   Post-operative Plan:   Informed Consent: I have reviewed the patients History and Physical, chart, labs and discussed the procedure including the risks, benefits and alternatives for the proposed anesthesia with the patient or authorized representative who has indicated his/her understanding and acceptance.     Plan Discussed with: CRNA  Anesthesia Plan Comments:         Anesthesia Quick Evaluation

## 2017-03-18 NOTE — Transfer of Care (Signed)
Immediate Anesthesia Transfer of Care Note  Patient: Nina Johnston  Procedure(s) Performed: COLONOSCOPY WITH PROPOFOL (N/A ) ESOPHAGOGASTRODUODENOSCOPY (EGD) WITH PROPOFOL (N/A )  Patient Location: PACU and Endoscopy Unit  Anesthesia Type:General  Level of Consciousness: awake and patient cooperative  Airway & Oxygen Therapy: Patient Spontanous Breathing and Patient connected to nasal cannula oxygen  Post-op Assessment: Report given to RN and Post -op Vital signs reviewed and stable  Post vital signs: Reviewed and stable  Last Vitals:  Vitals:   03/18/17 0732  BP: (!) 140/51  Pulse: 80  Resp: 16  Temp: (!) 36 C  SpO2: 100%    Last Pain:  Vitals:   03/18/17 0732  TempSrc: Tympanic         Complications: No apparent anesthesia complications

## 2017-03-19 ENCOUNTER — Encounter: Payer: Self-pay | Admitting: Unknown Physician Specialty

## 2017-03-19 LAB — SURGICAL PATHOLOGY

## 2017-03-30 ENCOUNTER — Telehealth (INDEPENDENT_AMBULATORY_CARE_PROVIDER_SITE_OTHER): Payer: Self-pay | Admitting: Vascular Surgery

## 2017-03-30 NOTE — Telephone Encounter (Signed)
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 I spoke with GS and he advise for the patient to make a appointment to seen in the office

## 2017-03-30 NOTE — Telephone Encounter (Signed)
New Message  Pts mom verbalized wanting to speak to nurse or MD in regards to her daughter developing an aneurism and if she needs to see the MD here.  Please f/u

## 2017-04-02 ENCOUNTER — Other Ambulatory Visit: Payer: Self-pay | Admitting: *Deleted

## 2017-04-03 ENCOUNTER — Ambulatory Visit
Admission: RE | Admit: 2017-04-03 | Discharge: 2017-04-03 | Disposition: A | Discharge status: Home / Self Care | Payer: Medicare Other | Source: Ambulatory Visit | Attending: Nephrology | Admitting: Nephrology

## 2017-04-03 DIAGNOSIS — N186 End stage renal disease: Secondary | ICD-10-CM | POA: Diagnosis not present

## 2017-04-03 DIAGNOSIS — D631 Anemia in chronic kidney disease: Secondary | ICD-10-CM | POA: Insufficient documentation

## 2017-04-03 LAB — PREPARE RBC (CROSSMATCH)

## 2017-04-03 LAB — HEMOGLOBIN AND HEMATOCRIT, BLOOD
HCT: 23.5 % — ABNORMAL LOW (ref 35.0–47.0)
Hemoglobin: 7.8 g/dL — ABNORMAL LOW (ref 12.0–16.0)

## 2017-04-03 MED ORDER — SODIUM CHLORIDE 0.9 % IV SOLN
Freq: Once | INTRAVENOUS | Status: AC
  Administered 2017-04-03: 09:00:00 via INTRAVENOUS

## 2017-04-04 LAB — BPAM RBC
Blood Product Expiration Date: 201812032359
ISSUE DATE / TIME: 201811301044
Unit Type and Rh: 5100

## 2017-04-04 LAB — TYPE AND SCREEN
ABO/RH(D): O POS
Antibody Screen: NEGATIVE
Unit division: 0

## 2017-04-13 ENCOUNTER — Ambulatory Visit (INDEPENDENT_AMBULATORY_CARE_PROVIDER_SITE_OTHER): Payer: Medicare Other | Admitting: Vascular Surgery

## 2017-04-15 ENCOUNTER — Ambulatory Visit
Admission: RE | Admit: 2017-04-15 | Discharge: 2017-04-15 | Disposition: A | Discharge status: Home / Self Care | Payer: Medicare Other | Source: Ambulatory Visit | Attending: Obstetrics and Gynecology | Admitting: Obstetrics and Gynecology

## 2017-04-15 ENCOUNTER — Other Ambulatory Visit: Payer: Self-pay | Admitting: Obstetrics and Gynecology

## 2017-04-15 DIAGNOSIS — Z1231 Encounter for screening mammogram for malignant neoplasm of breast: Secondary | ICD-10-CM | POA: Insufficient documentation

## 2017-04-15 DIAGNOSIS — Z1239 Encounter for other screening for malignant neoplasm of breast: Secondary | ICD-10-CM

## 2017-05-13 ENCOUNTER — Other Ambulatory Visit: Payer: Self-pay | Admitting: Neurology

## 2017-05-13 DIAGNOSIS — R404 Transient alteration of awareness: Secondary | ICD-10-CM

## 2017-05-13 DIAGNOSIS — R270 Ataxia, unspecified: Secondary | ICD-10-CM

## 2017-05-13 DIAGNOSIS — R4789 Other speech disturbances: Secondary | ICD-10-CM

## 2017-05-15 ENCOUNTER — Ambulatory Visit
Admission: RE | Admit: 2017-05-15 | Discharge: 2017-05-15 | Disposition: A | Discharge status: Home / Self Care | Payer: Medicare Other | Source: Ambulatory Visit | Attending: Nephrology | Admitting: Nephrology

## 2017-05-15 DIAGNOSIS — D649 Anemia, unspecified: Secondary | ICD-10-CM | POA: Insufficient documentation

## 2017-05-15 LAB — PREPARE RBC (CROSSMATCH)

## 2017-05-15 LAB — HEMOGLOBIN: Hemoglobin: 7.4 g/dL — ABNORMAL LOW (ref 12.0–16.0)

## 2017-05-16 LAB — BPAM RBC
Blood Product Expiration Date: 201901182359
ISSUE DATE / TIME: 201901111032
Unit Type and Rh: 5100

## 2017-05-16 LAB — TYPE AND SCREEN
ABO/RH(D): O POS
Antibody Screen: NEGATIVE
Unit division: 0

## 2017-05-27 ENCOUNTER — Other Ambulatory Visit: Payer: Self-pay | Admitting: Neurology

## 2017-05-27 DIAGNOSIS — R404 Transient alteration of awareness: Secondary | ICD-10-CM

## 2017-05-27 DIAGNOSIS — R4789 Other speech disturbances: Secondary | ICD-10-CM

## 2017-05-27 DIAGNOSIS — R270 Ataxia, unspecified: Secondary | ICD-10-CM

## 2017-06-08 ENCOUNTER — Ambulatory Visit (INDEPENDENT_AMBULATORY_CARE_PROVIDER_SITE_OTHER): Payer: Medicare Other | Admitting: Neurology

## 2017-06-08 ENCOUNTER — Encounter: Payer: Self-pay | Admitting: Neurology

## 2017-06-08 VITALS — BP 144/62 | HR 72 | Ht 62.0 in | Wt 217.0 lb

## 2017-06-08 DIAGNOSIS — N186 End stage renal disease: Secondary | ICD-10-CM | POA: Diagnosis not present

## 2017-06-08 DIAGNOSIS — R404 Transient alteration of awareness: Secondary | ICD-10-CM

## 2017-06-08 DIAGNOSIS — D631 Anemia in chronic kidney disease: Secondary | ICD-10-CM

## 2017-06-08 DIAGNOSIS — R270 Ataxia, unspecified: Secondary | ICD-10-CM

## 2017-06-08 DIAGNOSIS — R4789 Other speech disturbances: Secondary | ICD-10-CM

## 2017-06-08 DIAGNOSIS — T8619 Other complication of kidney transplant: Secondary | ICD-10-CM

## 2017-06-08 DIAGNOSIS — Z992 Dependence on renal dialysis: Secondary | ICD-10-CM

## 2017-06-08 DIAGNOSIS — Z9189 Other specified personal risk factors, not elsewhere classified: Secondary | ICD-10-CM

## 2017-06-08 MED ORDER — LAMOTRIGINE 200 MG PO TABS: 300.0000 mg | ORAL_TABLET | Freq: Two times a day (BID) | ORAL | 5 refills | Status: DC

## 2017-06-08 MED ORDER — CLONAZEPAM 1 MG PO TABS: 1.0000 mg | ORAL_TABLET | Freq: Every day | ORAL | 5 refills | Status: DC

## 2017-06-08 MED ORDER — ETHOSUXIMIDE 250 MG/5ML PO SOLN: ORAL | 3 refills | Status: DC

## 2017-06-08 NOTE — Addendum Note (Signed)
Addended by: Larey Seat on: 06/08/2017 03:29 PM   Modules accepted: Orders

## 2017-06-08 NOTE — Patient Instructions (Signed)
Anemia Anemia is a condition in which you do not have enough red blood cells or hemoglobin. Hemoglobin is a substance in red blood cells that carries oxygen. When you do not have enough red blood cells or hemoglobin (are anemic), your body cannot get enough oxygen and your organs may not work properly. As a result, you may feel very tired or have other problems. What are the causes? Common causes of anemia include:  Excessive bleeding. Anemia can be caused by excessive bleeding inside or outside the body, including bleeding from the intestine or from periods in women.  Poor nutrition.  Long-lasting (chronic) kidney, thyroid, and liver disease.  Bone marrow disorders.  Cancer and treatments for cancer.  HIV (human immunodeficiency virus) and AIDS (acquired immunodeficiency syndrome).  Treatments for HIV and AIDS.  Spleen problems.  Blood disorders.  Infections, medicines, and autoimmune disorders that destroy red blood cells.  What are the signs or symptoms? Symptoms of this condition include:  Minor weakness.  Dizziness.  Headache.  Feeling heartbeats that are irregular or faster than normal (palpitations).  Shortness of breath, especially with exercise.  Paleness.  Cold sensitivity.  Indigestion.  Nausea.  Difficulty sleeping.  Difficulty concentrating.  Symptoms may occur suddenly or develop slowly. If your anemia is mild, you may not have symptoms. How is this diagnosed? This condition is diagnosed based on:  Blood tests.  Your medical history.  A physical exam.  Bone marrow biopsy.  Your health care provider may also check your stool (feces) for blood and may do additional testing to look for the cause of your bleeding. You may also have other tests, including:  Imaging tests, such as a CT scan or MRI.  Endoscopy.  Colonoscopy.  How is this treated? Treatment for this condition depends on the cause. If you continue to lose a lot of blood,  you may need to be treated at a hospital. Treatment may include:  Taking supplements of iron, vitamin B12, or folic acid.  Taking a hormone medicine (erythropoietin) that can help to stimulate red blood cell growth.  Having a blood transfusion. This may be needed if you lose a lot of blood.  Making changes to your diet.  Having surgery to remove your spleen.  Follow these instructions at home:  Take over-the-counter and prescription medicines only as told by your health care provider.  Take supplements only as told by your health care provider.  Follow any diet instructions that you were given.  Keep all follow-up visits as told by your health care provider. This is important. Contact a health care provider if:  You develop new bleeding anywhere in the body. Get help right away if:  You are very weak.  You are short of breath.  You have pain in your abdomen or chest.  You are dizzy or feel faint.  You have trouble concentrating.  You have bloody or black, tarry stools.  You vomit repeatedly or you vomit up blood. Summary  Anemia is a condition in which you do not have enough red blood cells or enough of a substance in your red blood cells that carries oxygen (hemoglobin).  Symptoms may occur suddenly or develop slowly.  If your anemia is mild, you may not have symptoms.  This condition is diagnosed with blood tests as well as a medical history and physical exam. Other tests may be needed.  Treatment for this condition depends on the cause of the anemia. This information is not intended to replace advice   given to you by your health care provider. Make sure you discuss any questions you have with your health care provider. Document Released: 05/29/2004 Document Revised: 05/23/2016 Document Reviewed: 05/23/2016 Elsevier Interactive Patient Education  [employer].

## 2017-06-08 NOTE — Progress Notes (Signed)
[facility]  SLEEP MEDICINE CLINIC   Provider:  Dr Alivia Cimino Referring Provider: / Primary Care Physician:  Idelle Crouch, MD  Seizure like episodes - chief complaint.   HPI:  Nina Johnston is a 57 y.o. female here for a RV : Interval history from 08 June 2017, I have the pleasure of seeing Nina Johnston today, following a spell on January 29 during dialysis in which she stared off and was not responsive.  She also had a spell the same night at home witnessed by her mother.  There were further spells on August 1, August 31, September 12 and 14 as well as 22nd, October 16 and October 23.  Only 2 of these spells were prolonged with associated confusion or disorientation to her surroundings. Since he has saw her hematologist last Wednesday, her hematocrit and hemoglobin levels have been very low.  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  This occurred in 2011 and 2012.  She has not received any erythropoietin after that but I think she needs now.  In order to overcome this very severe anemia.  She has been transfused multiple times ( 9 times )  and that cannot be healthy either. Her hematologist and her nephrologist are both leading towards erythropoietin therapy again.  Her nephrologist is Audiological scientist, pathologist is Evelena Asa at [facility]. It's causing problems with dialysis effectiveness ( BP of 80/ 40 mmHg) , and with SOB.    CD_ This Caucasian right-handed, single female has been followed in this practice since 2009. 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  She had such spells  in August 2010 and September 2011 after carbamazepine was changed to a generic form of Carbatrol her blood levels were lower,  she also developed upper respiratory tract infections and pneumonia in 2011 and was unable for a while to use her CPAP which had been initiated in [place] for the treatment of obstructive sleep apnea. Paroxysmal events with mental status changes had continued,  03/17/2011  and 20 13 some of the spells are described as " automatisms"- continuing behavior while  the patient is staring off and acting without  reflecting not being  responsive to stimulation from the outside . After the spell passes, she became very sleepy each time. In August 2012 she was finally referred to an endocrinologist at [facility] after she had a hypoglycemic seizure and her diabetes medications were reduced, now her blood sugars are running between 150 and 200 in the morning,  fasting.Nina Johnston begun working out in 2013 with a Physiological scientist and was able to lose about 30 pounds . She returned to using her CPAP.her Hb A1 C. in October of last year was 6.1- she had no other hypoglycemic events reported since March 2013 . Also in 2013 she had to visit the local ER twice for abdominal spasms and cardiac / chest pain. negative work up. She was developing renal failure.  She has since been followed for a decreased renal clearance by internal medicine and nephrology. The patient is treated for her spells  and remains on Tegretol. It she will need blood levels for her anti-epiletic medications. Dr. Geryl Councilman is debating  to reduce her Klonopin , which may help reduce her fall risk as well. Vit D deficiency- takes supplement.    Note contact at [facility]; transplant. Dr . Talmadge Coventry,  [facility] [place] [phone]. History from 04/16/2016. Nina Johnston is here today for a routine revisit but she did have some healthcare over the last week. She is now on hemodialysis, and experienced bleeding at the AV fistula of her left arm  during the last treatment. She notice that the patella was blood drenched but her treatment time was almost over. She also had to be evaluated at the local hospital emergency room for a malignant blood pressure peak. Her systolic blood pressure went up , she had a very high heart rate between 150s and 160 bpm. The ED suspected that she had atrial flutter, and she had hypokalemia. Hgb was 8.2.  on last Monday she was seen by cardiology and told she had sinus tachycardia. Chest Xray as she coughed blood. mammogram was due, normal, and potassium /agnesium was normalized.   Interval history from 12/03/2016. I have pleasure of seeing Nina Johnston today who has been on hemodialysis since 18 month ago, she has a left arm AV fistula for hemodialysis access, her blood pressures have been lower especially in treatment and she was asked not to take hypertension medications prior to treatment. She will take the medication either after or the day before. I have The patient on antiepileptic medications after she had recurrent convulsions but we also found a correlation to hypoglycemia.  She has remained on Tegretol. She continues to take Lamictal. New problem: tachycardia since blood transfusion. Her hemoglobin was 6.9, her pulse rate after transfusion was well in the 120s. Pulse rate addressed today was 120. Please note that the patient had also tachycardia up to rates between 150s and 160 in December 2017. At that time she was diagnosed with hypokalemia, her hemoglobin then was 8.2. She needs to speak to her nephrologist.   Review of Systems: Out of a complete 14 system review, the patient complains of only the following symptoms:       Social History   Socioeconomic History  . Marital status: Single    Spouse name: Not on file  . Number of children: 0  . Years of education:  college  . Highest education level: Not on file  Social Needs  . Financial resource strain: Not on file  . Food insecurity  - worry: Not on file  . Food insecurity - inability: Not on file  . Transportation needs - medical: Not on file  . Transportation needs - non-medical: Not on file  Occupational History  . Occupation: not employed  Tobacco Use  . Smoking status: Never Smoker  . Smokeless tobacco: Never Used  Substance and Sexual Activity  . Alcohol use: No  . Drug use: No  . Sexual activity: Not Currently  Other Topics Concern  . Not on file  Social History Narrative   Patient is single and lives with her parents.   Patient is disabled.   Patient has a college education.   Patient is right- handed.   Patient drinks tea occasionally. 2 glasses of tea when they eat out.    Family History  Problem Relation Age of Onset  . Diabetes Mother   . Lumbar disc disease Mother   . Migraines Mother   . Hypertension Mother   . Cancer - Prostate Father        mild  .  Diabetes Father   . Hypertension Father   . Breast cancer Neg Hx   . Ovarian cancer Neg Hx   . Colon cancer Neg Hx   . Heart disease Neg Hx   . Kidney cancer Neg Hx   . Bladder Cancer Neg Hx     Past Medical History:  Diagnosis Date  . A-V fistula ([place])   . Acute bacterial sinusitis 04/19/2014  . Adaptive colitis   . Anginal pain ([place])   . Anxiety   . Anxiety and depression   . Arthritis   . Asthma   . 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 Benign essential HTN 12/26/2014  . Bipolar disorder ([place])   . Bronchitis   . Carotid artery narrowing 12/22/2014  . Cervical prolapse   . CHF (congestive heart failure) ([place])   . CKD (chronic kidney disease)   . Concussion 01-14-14   Fall   . COPD (chronic obstructive pulmonary disease) ([place])   . Coronary artery disease   . Depression   . Diabetes mellitus without complication ([place])   . Dialysis patient ([place])   . Diplopia   . Dupuytren's contracture of foot   . Essential (primary) hypertension 02/12/2015  .  Family history of thyroid problem   . GERD (gastroesophageal reflux disease)   . Gout   . Heart disease    enlarged because of COPD  . Heart murmur   . Hepatomegaly   . HLD (hyperlipidemia)   . Hyperactive airway disease   . Hyperkalemia   . Hypertension   . Hyperthyroidism   . Irritable colon   . Morbid obesity ([place])   . Multiple thyroid nodules   . OAB (overactive bladder)   . Pernicious anemia   . Post menopausal syndrome   . Post-concussion syndrome 03/08/2014  . Post-traumatic brain syndrome   . Prolapsed uterus   . PSVT (paroxysmal supraventricular tachycardia) ([place])   . Renal calculus   . Renal disease    w/ GFR 29-may be due to diabetes  . Seizures ([place])    last seizure 2012. has had spells since with no knowledge. last was 1 month ago  . Sleep apnea   . 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 Transient alteration of awareness 11/10/2012  . Urinary incontinence with continuous leakage 03/08/2014    Past Surgical History:  Procedure Laterality Date  . A/V FISTULAGRAM Left 07/21/2016   Procedure: A/V Fistulagram;  Surgeon: Algernon Huxley, MD;  Location: [place] [facility];  Service: Cardiovascular;  Laterality: Left;  . A/V FISTULAGRAM Left 12/24/2016   Procedure: A/V Fistulagram;  Surgeon: Algernon Huxley, MD;  Location: [place] CV [facility];  Service: Cardiovascular;  Laterality: Left;  . A/V SHUNT INTERVENTION N/A 07/21/2016   Procedure: A/V Shunt Intervention;  Surgeon: Algernon Huxley, MD;  Location: [place] [facility];  Service: Cardiovascular;  Laterality: N/A;  . A/V SHUNT INTERVENTION N/A 12/24/2016   Procedure: A/V SHUNT INTERVENTION;  Surgeon: Algernon Huxley, MD;  Location: [place] [facility];  Service: Cardiovascular;  Laterality: N/A;  . AVF    . CHOLECYSTECTOMY    . COLONOSCOPY    . COLONOSCOPY WITH PROPOFOL N/A 03/18/2017   Procedure: COLONOSCOPY WITH PROPOFOL;  Surgeon: Manya Silvas, MD;  Location: [facility] ENDOSCOPY;   Service: Endoscopy;  Laterality: N/A;  . ENDOMETRIAL BIOPSY  ablation, uterine  . ESOPHAGOGASTRODUODENOSCOPY (EGD) WITH PROPOFOL N/A 03/18/2017   Procedure: ESOPHAGOGASTRODUODENOSCOPY (EGD) WITH PROPOFOL;  Surgeon: Manya Silvas, MD;  Location: [facility] ENDOSCOPY;  Service: Endoscopy;  Laterality: N/A;  . HERNIA REPAIR    . HERNIA REPAIR    . PERIPHERAL VASCULAR CATHETERIZATION N/A 08/23/2015   Procedure: Dialysis/Perma Catheter Insertion;  Surgeon: Algernon Huxley, MD;  Location: [place] [facility];  Service: Cardiovascular;  Laterality: N/A;  . PERIPHERAL VASCULAR CATHETERIZATION N/A 11/09/2015   Procedure: Dialysis/Perma Catheter Removal;  Surgeon: Katha Cabal, MD;  Location: [place] CV LAB;  Service: Cardiovascular;  Laterality: N/A;  . PERIPHERAL VASCULAR CATHETERIZATION Left 04/23/2016   Procedure: A/V Fistulagram;  Surgeon: Algernon Huxley, MD;  Location: [place] CV LAB;  Service: Cardiovascular;  Laterality: Left;  . PORT A CATH REVISION      Current Outpatient Medications  Medication Sig Dispense Refill  . amLODipine (NORVASC) 10 MG tablet Take 5 mg by mouth See admin instructions. Only takes on non-dialysis days    . aspirin 81 MG tablet Take 81 mg by mouth daily.     Marland Kitchen b complex vitamins tablet Take 1 tablet by mouth every evening.     . cefdinir (OMNICEF) 300 MG capsule Take 300 mg by mouth daily. 14 day course started 12-15-16    . chlorpheniramine-HYDROcodone (TUSSIONEX) 10-8 MG/5ML SUER Take 2.5 mLs by mouth every 12 (twelve) hours as needed for cough.    . clonazePAM (KLONOPIN) 1 MG tablet TAKE ONE TABLET BY MOUTH AT BEDTIME 30 tablet 5  . cyanocobalamin (,VITAMIN B-12,) 1000 MCG/ML injection Inject 1,000 mcg into the muscle every 14 (fourteen) days.     Mariane Baumgarten Calcium (STOOL SOFTENER PO) Take 1 capsule by mouth every morning.     Marland Kitchen ethosuximide (ZARONTIN) 250 MG/5ML solution TAKE 5ML BY MOUTH 2 TIMES DAILY 474 mL 3  . Febuxostat (ULORIC) 80 MG TABS Take 1  tablet by mouth daily. Morning dosage     . fexofenadine (ALLEGRA) 180 MG tablet Take 180 mg by mouth daily. am    . Fluticasone-Salmeterol (ADVAIR DISKUS) 250-50 MCG/DOSE AEPB Inhale 1 puff into the lungs 2 (two) times daily.    . folic acid-vitamin b complex-vitamin c-selenium-zinc (DIALYVITE) 3 MG TABS tablet Take 1 tablet by mouth daily.    . furosemide (LASIX) 20 MG tablet Take 30 mg by mouth 2 (two) times daily. Takes 1.5 tablet    . glucose blood (ONE TOUCH ULTRA TEST) test strip Use 4 (four) times daily. Test 3 - 4 times daily E11.21    . GuaiFENesin (MUCINEX PO) Take 1 tablet by mouth 2 (two) times daily as needed. Expectorant, as needed     . hydrALAZINE (APRESOLINE) 100 MG tablet Take 100 mg by mouth 3 (three) times daily. No morning dose on dialysis days    . insulin glargine (LANTUS) 100 UNIT/ML injection Inject 26 Units into the skin at bedtime.     . insulin lispro (HUMALOG) 100 UNIT/ML injection Inject 8-16 Units into the skin 2 (two) times daily with a meal. Sliding scale takes in sugars are over 200-299 2 units, 300-399 3 units, over 400 4 units.     . Insulin Pen Needle (BD PEN NEEDLE NANO U/F) 32G X 4 MM MISC     . lamoTRIgine (LAMICTAL) 200 MG tablet Take 1 tablet (200 mg total) by mouth 2 (two) times daily. (Patient taking differently: Take 300 mg by mouth 2 (two) times daily. Takes 1.5  tablet.  No generics must be glaxo brand others cause grand mal seizures) 180 tablet 2  . levothyroxine (SYNTHROID) 150 MCG tablet Take 150 mcg by mouth daily before breakfast. Am dosage     . metoprolol succinate (TOPROL XL) 50 MG 24 hr tablet Take 1 tablet (50 mg total) by mouth 2 (two) times daily. Take with or immediately following a meal. (Patient taking differently: Take 75 mg by mouth 2 (two) times daily. Take with or immediately following a meal. Does not take on mornings of dialysis) 60 tablet 11  . montelukast (SINGULAIR) 10 MG tablet Take 10 mg by mouth at bedtime.    Marland Kitchen  nystatin-triamcinolone (MYCOLOG II) cream Apply 1 application topically 2 (two) times daily. (Patient taking differently: Apply 1 application topically daily as needed (rash). ) 30 g 1  . pantoprazole (PROTONIX) 40 MG tablet Take 40 mg by mouth every morning.     . predniSONE (DELTASONE) 10 MG tablet Take 10-60 mg by mouth daily with breakfast. Taper dose starting 12-15-16    . Probiotic Product (ALIGN) 4 MG CAPS Take 4 mg by mouth daily.     . sevelamer carbonate (RENVELA) 800 MG tablet Take 3,200 mg by mouth 3 (three) times daily with meals.     . TEGRETOL-XR 100 MG 12 hr tablet TAKE 2 TABLETS IN THE MORNING AND 1 TABLET IN THE EVENING 90 tablet 0  . triamcinolone (NASACORT ALLERGY 24HR) 55 MCG/ACT AERO nasal inhaler Place 2 sprays into the nose at bedtime.      No current facility-administered medications for this visit.     Allergies as of 06/08/2017 - Review Complete 05/15/2017  Allergen Reaction Noted  . Cinnamon Other (See Comments) 11/09/2012  . Procrit [epoetin alfa]  11/10/2012  . Ace inhibitors Cough 09/06/2014  . Azithromycin Other (See Comments) 09/06/2014  . Iron  03/31/2016  . Neomycin-bacitracin zn-polymyx Other (See Comments) 09/06/2014  . Sulfa antibiotics  09/06/2014    Vitals: BP (!) 144/62   Pulse 72   Ht 5\' 2"  (1.575 m)   Wt 217 lb (98.4 kg)   BMI 39.69 kg/m  Last Weight:  Wt Readings from Last 1 Encounters:  06/08/17 217 lb (98.4 kg)   Last Height:   Ht Readings from Last 1 Encounters:  06/08/17 5\' 2"  (1.575 m)   Vision Screening:   Physical exam:  General: The patient is awake, alert and appears not in acute distress. The patient is well groomed. Head: Normocephalic, atraumatic. Neck is supple.  Mallampati 1 - there is no uvula , neck circumference:15.5 inches.   On renal low potassium diet, she lost 45 pounds .  Cardiovascular:  Regular rate and rhythm, without  murmurs or carotid bruit, and without distended neck veins. Respiratory: Lungs are  clear to auscultation. Skin: ankle  edema, no rash. Looking jaundiced, bronzed .  Dry skin, low TURGOR, appears dehydrated and dusky. Bronzed skin. .  Trunk: BMI is morbidly obese-  Abdominal hernia.  patient has normal posture.   Neurologic exam : The patient is awake and alert, oriented to place and time. Memory subjective described as intact.  There is a normal attention span & concentration ability. Speech is fluent without dysarthria or aphasia. Mood and affect are appropriate. Cranial nerves: Pupils are equal and briskly reactive to light. No disrounded pupils,  Yellowish sclerea.   Visual fields by finger perimetry are intact.Hearing to finger rub intact. Facial sensation intact to fine touch. Facial motor strength is symmetric and tongue  moves midline. Motor exam:  Normal tone and normal muscle bulk, obesity related attenuated DTR , and symmetric normal strength in all extremities. She has an AV access fistula on the left arm. Thrill.  Sensory:  Fine touch, pinprick and vibration were tested in all extremities and are presents in toes and feet. Proprioception is tested and normal. Coordination: Rapid alternating movements in the fingers/hands is tested and normal. Finger-to-nose maneuver tested and normal without evidence of ataxia, dysmetria or tremor. Gait and station: Patient walks without assistive device and is very slow - Appears truncally rigid, with no lumbar rotation, turns with 5 steps, and has reduced step width. Strength within normal limits. Stance is stable and normal. Tandem gait is fragmented. She waddels.  Deep tendon reflexes: in the upper and lower extremities are symmetric and intact.  Babinski maneuver response is down going on the left and equivocal on the right.    I am quite concerned about Nina Johnston's anemia and her constant need for transfusions.  In the past I was very worried about erythropoietin because it seems to be time correlated to the few generalized  tonic-clonic seizures she has suffered.  However this was almost 8 years ago and I think we need to put her back on a blood building hormone.  Erythropoietin may not be the only choice these days.   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 creatinine is around 7.  Morbidly obese. Increasing weight- new dry weight 95.5 Kg  Keep on anti epileptic meds.    Larey Seat, MD PS I will asked Dr. Domenick Gong if it is possible for Nina Johnston to have a trial dose of erythropoietin first, preferably on a day she can be with her family to be observed and if that goes well from there on she could have her erythropoietin at dialysis.

## 2017-06-12 ENCOUNTER — Ambulatory Visit
Admission: RE | Admit: 2017-06-12 | Discharge: 2017-06-12 | Disposition: A | Discharge status: Home / Self Care | Payer: Medicare Other | Source: Ambulatory Visit | Attending: Nephrology | Admitting: Nephrology

## 2017-06-12 DIAGNOSIS — N186 End stage renal disease: Secondary | ICD-10-CM | POA: Diagnosis present

## 2017-06-12 DIAGNOSIS — D631 Anemia in chronic kidney disease: Secondary | ICD-10-CM | POA: Insufficient documentation

## 2017-06-12 LAB — HEMOGLOBIN AND HEMATOCRIT, BLOOD
HCT: 23.6 % — ABNORMAL LOW (ref 35.0–47.0)
Hemoglobin: 8 g/dL — ABNORMAL LOW (ref 12.0–16.0)

## 2017-06-12 LAB — PREPARE RBC (CROSSMATCH)

## 2017-06-12 MED ORDER — SODIUM CHLORIDE 0.9 % IV SOLN: Freq: Once | INTRAVENOUS | Status: DC

## 2017-06-13 LAB — TYPE AND SCREEN
ABO/RH(D): O POS
Antibody Screen: NEGATIVE
Unit division: 0

## 2017-06-13 LAB — BPAM RBC
Blood Product Expiration Date: 201903112359
ISSUE DATE / TIME: 201902081438
Unit Type and Rh: 5100

## 2017-06-17 ENCOUNTER — Ambulatory Visit (INDEPENDENT_AMBULATORY_CARE_PROVIDER_SITE_OTHER): Payer: Medicare Other

## 2017-06-17 ENCOUNTER — Other Ambulatory Visit (INDEPENDENT_AMBULATORY_CARE_PROVIDER_SITE_OTHER): Payer: Self-pay | Admitting: General Practice

## 2017-06-17 DIAGNOSIS — N186 End stage renal disease: Secondary | ICD-10-CM

## 2017-06-17 DIAGNOSIS — T829XXD Unspecified complication of cardiac and vascular prosthetic device, implant and graft, subsequent encounter: Secondary | ICD-10-CM

## 2017-06-17 DIAGNOSIS — Z992 Dependence on renal dialysis: Principal | ICD-10-CM

## 2017-06-18 ENCOUNTER — Telehealth (INDEPENDENT_AMBULATORY_CARE_PROVIDER_SITE_OTHER): Payer: Self-pay | Admitting: Vascular Surgery

## 2017-06-18 NOTE — Telephone Encounter (Signed)
Patient calling requesting [place] results from yesterday. Please call with results.

## 2017-06-18 NOTE — Telephone Encounter (Signed)
Called the patient back to let her know that Dr. Lucky Cowboy is out of the office until tomorrow and he has not yet seen her ultrasound, and he will need time to read them and then he will let us know what needs to be done or scheduled.  Patient maybe at dialysis from 4-8pm  Patient has given Denyse Amass or Theodis Sato the permission to take the message at--> [phone] if no one answers at the other number on file for her.

## 2017-06-19 ENCOUNTER — Telehealth (INDEPENDENT_AMBULATORY_CARE_PROVIDER_SITE_OTHER): Payer: Self-pay | Admitting: Vascular Surgery

## 2017-06-19 NOTE — Telephone Encounter (Signed)
Called the patient back to let her know that Dr. Lucky Cowboy stated that after reviewing her ultrasound results, and there is no narrowing and everything is regular and in normal range. There was no answer at this number and I did not leave an answer.

## 2017-06-19 NOTE — Telephone Encounter (Signed)
Called the patient to let her know that after Dr. Lucky Cowboy reviewed her ultrasound, he found that there was no narrowing and that her levels are normal at this time.

## 2017-06-22 ENCOUNTER — Telehealth (INDEPENDENT_AMBULATORY_CARE_PROVIDER_SITE_OTHER): Payer: Self-pay | Admitting: Vascular Surgery

## 2017-06-22 NOTE — Telephone Encounter (Signed)
Called patient back to discuss her June 17, 2017 hemodialysis access duplex.  The patient has been having issues with being able to complete her dialysis runs.  The patient was referred to our office to undergo the duplex as per her dialysis center due to a poor functioning fistula.  The patient has less than 100 PSVT flow rate throughout the fistula.  Total flow volume 612.  Diameter change from 0.38 cm to 0.22 cm near anastomosis.  Multiple branches seen mid fistula.  There is retrograde flow in the right radial artery distal to the anastomosis.  Recommend a left upper extremity fistulogram with possible intervention.  Procedure, risks and benefits explained to the patient.  All questions answered.  The patient wishes to proceed.  The patient states that she dialyzes Tuesdays Thursdays and Saturdays.  I will ask Mickel Baas to call the patient to schedule this on Dr. Bunnie Domino schedule

## 2017-06-22 NOTE — Telephone Encounter (Signed)
Left a message on the patient's cell phone asking her to return my phone call to discuss her June 18, 2007 dialysis access duplex results

## 2017-06-23 ENCOUNTER — Other Ambulatory Visit (INDEPENDENT_AMBULATORY_CARE_PROVIDER_SITE_OTHER): Payer: Self-pay | Admitting: Vascular Surgery

## 2017-06-23 ENCOUNTER — Encounter (INDEPENDENT_AMBULATORY_CARE_PROVIDER_SITE_OTHER): Payer: Self-pay

## 2017-06-28 MED ORDER — CEFAZOLIN SODIUM-DEXTROSE 1-4 GM/50ML-% IV SOLN: 1.0000 g | Freq: Once | INTRAVENOUS | Status: DC

## 2017-06-29 ENCOUNTER — Ambulatory Visit
Admission: RE | Admit: 2017-06-29 | Discharge: 2017-06-29 | Disposition: A | Discharge status: Home / Self Care | Payer: Medicare Other | Source: Ambulatory Visit | Attending: Vascular Surgery | Admitting: Vascular Surgery

## 2017-06-29 ENCOUNTER — Encounter
Admission: RE | Disposition: A | Discharge status: Home / Self Care | Payer: Self-pay | Source: Ambulatory Visit | Attending: Vascular Surgery

## 2017-06-29 ENCOUNTER — Encounter: Payer: Self-pay | Admitting: *Deleted

## 2017-06-29 DIAGNOSIS — I10 Essential (primary) hypertension: Secondary | ICD-10-CM | POA: Diagnosis not present

## 2017-06-29 DIAGNOSIS — J449 Chronic obstructive pulmonary disease, unspecified: Secondary | ICD-10-CM | POA: Insufficient documentation

## 2017-06-29 DIAGNOSIS — M109 Gout, unspecified: Secondary | ICD-10-CM | POA: Insufficient documentation

## 2017-06-29 DIAGNOSIS — Z888 Allergy status to other drugs, medicaments and biological substances status: Secondary | ICD-10-CM | POA: Insufficient documentation

## 2017-06-29 DIAGNOSIS — E1122 Type 2 diabetes mellitus with diabetic chronic kidney disease: Secondary | ICD-10-CM | POA: Insufficient documentation

## 2017-06-29 DIAGNOSIS — Z9889 Other specified postprocedural states: Secondary | ICD-10-CM | POA: Insufficient documentation

## 2017-06-29 DIAGNOSIS — T82868A Thrombosis of vascular prosthetic devices, implants and grafts, initial encounter: Secondary | ICD-10-CM | POA: Diagnosis not present

## 2017-06-29 DIAGNOSIS — I132 Hypertensive heart and chronic kidney disease with heart failure and with stage 5 chronic kidney disease, or end stage renal disease: Secondary | ICD-10-CM | POA: Insufficient documentation

## 2017-06-29 DIAGNOSIS — N186 End stage renal disease: Secondary | ICD-10-CM

## 2017-06-29 DIAGNOSIS — Z6836 Body mass index (BMI) 36.0-36.9, adult: Secondary | ICD-10-CM | POA: Insufficient documentation

## 2017-06-29 DIAGNOSIS — G4730 Sleep apnea, unspecified: Secondary | ICD-10-CM | POA: Insufficient documentation

## 2017-06-29 DIAGNOSIS — Z882 Allergy status to sulfonamides status: Secondary | ICD-10-CM | POA: Diagnosis not present

## 2017-06-29 DIAGNOSIS — I2510 Atherosclerotic heart disease of native coronary artery without angina pectoris: Secondary | ICD-10-CM | POA: Diagnosis not present

## 2017-06-29 DIAGNOSIS — D510 Vitamin B12 deficiency anemia due to intrinsic factor deficiency: Secondary | ICD-10-CM | POA: Diagnosis not present

## 2017-06-29 DIAGNOSIS — Z87820 Personal history of traumatic brain injury: Secondary | ICD-10-CM | POA: Insufficient documentation

## 2017-06-29 DIAGNOSIS — Z833 Family history of diabetes mellitus: Secondary | ICD-10-CM | POA: Insufficient documentation

## 2017-06-29 DIAGNOSIS — Z87442 Personal history of urinary calculi: Secondary | ICD-10-CM | POA: Insufficient documentation

## 2017-06-29 DIAGNOSIS — Z9049 Acquired absence of other specified parts of digestive tract: Secondary | ICD-10-CM | POA: Insufficient documentation

## 2017-06-29 DIAGNOSIS — T82858A Stenosis of vascular prosthetic devices, implants and grafts, initial encounter: Secondary | ICD-10-CM | POA: Insufficient documentation

## 2017-06-29 DIAGNOSIS — Z881 Allergy status to other antibiotic agents status: Secondary | ICD-10-CM | POA: Diagnosis not present

## 2017-06-29 DIAGNOSIS — Z91018 Allergy to other foods: Secondary | ICD-10-CM | POA: Insufficient documentation

## 2017-06-29 DIAGNOSIS — Z8269 Family history of other diseases of the musculoskeletal system and connective tissue: Secondary | ICD-10-CM | POA: Insufficient documentation

## 2017-06-29 DIAGNOSIS — E0590 Thyrotoxicosis, unspecified without thyrotoxic crisis or storm: Secondary | ICD-10-CM | POA: Insufficient documentation

## 2017-06-29 DIAGNOSIS — E119 Type 2 diabetes mellitus without complications: Secondary | ICD-10-CM | POA: Diagnosis not present

## 2017-06-29 DIAGNOSIS — K219 Gastro-esophageal reflux disease without esophagitis: Secondary | ICD-10-CM | POA: Insufficient documentation

## 2017-06-29 DIAGNOSIS — E11649 Type 2 diabetes mellitus with hypoglycemia without coma: Secondary | ICD-10-CM | POA: Diagnosis not present

## 2017-06-29 DIAGNOSIS — I471 Supraventricular tachycardia: Secondary | ICD-10-CM | POA: Diagnosis not present

## 2017-06-29 DIAGNOSIS — Y832 Surgical operation with anastomosis, bypass or graft as the cause of abnormal reaction of the patient, or of later complication, without mention of misadventure at the time of the procedure: Secondary | ICD-10-CM | POA: Insufficient documentation

## 2017-06-29 DIAGNOSIS — Z8249 Family history of ischemic heart disease and other diseases of the circulatory system: Secondary | ICD-10-CM | POA: Insufficient documentation

## 2017-06-29 DIAGNOSIS — Z820 Family history of epilepsy and other diseases of the nervous system: Secondary | ICD-10-CM | POA: Insufficient documentation

## 2017-06-29 DIAGNOSIS — I509 Heart failure, unspecified: Secondary | ICD-10-CM | POA: Diagnosis not present

## 2017-06-29 DIAGNOSIS — K589 Irritable bowel syndrome without diarrhea: Secondary | ICD-10-CM | POA: Diagnosis not present

## 2017-06-29 DIAGNOSIS — Z992 Dependence on renal dialysis: Secondary | ICD-10-CM | POA: Insufficient documentation

## 2017-06-29 DIAGNOSIS — E785 Hyperlipidemia, unspecified: Secondary | ICD-10-CM | POA: Diagnosis not present

## 2017-06-29 DIAGNOSIS — N3281 Overactive bladder: Secondary | ICD-10-CM | POA: Insufficient documentation

## 2017-06-29 DIAGNOSIS — E875 Hyperkalemia: Secondary | ICD-10-CM | POA: Insufficient documentation

## 2017-06-29 DIAGNOSIS — Z8042 Family history of malignant neoplasm of prostate: Secondary | ICD-10-CM | POA: Insufficient documentation

## 2017-06-29 HISTORY — PX: A/V FISTULAGRAM: CATH118298

## 2017-06-29 LAB — GLUCOSE, CAPILLARY
Glucose-Capillary: 130 mg/dL — ABNORMAL HIGH (ref 65–99)
Glucose-Capillary: 126 mg/dL — ABNORMAL HIGH (ref 65–99)

## 2017-06-29 LAB — POTASSIUM (ARMC VASCULAR LAB ONLY): Potassium (ARMC vascular lab): 4.2 (ref 3.5–5.1)

## 2017-06-29 SURGERY — A/V FISTULAGRAM
Anesthesia: Moderate Sedation | Laterality: Left

## 2017-06-29 MED ORDER — FENTANYL CITRATE (PF) 100 MCG/2ML IJ SOLN
INTRAMUSCULAR | Status: AC
  Filled 2017-06-29: qty 2

## 2017-06-29 MED ORDER — HEPARIN (PORCINE) IN NACL 2-0.9 UNIT/ML-% IJ SOLN
INTRAMUSCULAR | Status: AC
  Filled 2017-06-29: qty 1000

## 2017-06-29 MED ORDER — HEPARIN SODIUM (PORCINE) 1000 UNIT/ML IJ SOLN
INTRAMUSCULAR | Status: DC | PRN
  Administered 2017-06-29: 3000 [IU] via INTRAVENOUS

## 2017-06-29 MED ORDER — MIDAZOLAM HCL 5 MG/5ML IJ SOLN
INTRAMUSCULAR | Status: AC
  Filled 2017-06-29: qty 5

## 2017-06-29 MED ORDER — MIDAZOLAM HCL 2 MG/2ML IJ SOLN
INTRAMUSCULAR | Status: DC | PRN
  Administered 2017-06-29 (×2): 1 mg via INTRAVENOUS
  Administered 2017-06-29: 2 mg via INTRAVENOUS
  Administered 2017-06-29: 1 mg via INTRAVENOUS

## 2017-06-29 MED ORDER — FENTANYL CITRATE (PF) 100 MCG/2ML IJ SOLN
INTRAMUSCULAR | Status: DC | PRN
  Administered 2017-06-29 (×3): 50 ug via INTRAVENOUS
  Administered 2017-06-29: 25 ug
  Administered 2017-06-29: 50 ug via INTRAVENOUS

## 2017-06-29 MED ORDER — SODIUM CHLORIDE 0.9 % IV SOLN
INTRAVENOUS | Status: DC
  Administered 2017-06-29: 11:00:00 via INTRAVENOUS

## 2017-06-29 MED ORDER — ONDANSETRON HCL 4 MG/2ML IJ SOLN: 4.0000 mg | Freq: Four times a day (QID) | INTRAMUSCULAR | Status: DC | PRN

## 2017-06-29 MED ORDER — HEPARIN SODIUM (PORCINE) 1000 UNIT/ML IJ SOLN
INTRAMUSCULAR | Status: AC
  Filled 2017-06-29: qty 1

## 2017-06-29 MED ORDER — METHYLPREDNISOLONE SODIUM SUCC 125 MG IJ SOLR: 125.0000 mg | INTRAMUSCULAR | Status: DC | PRN

## 2017-06-29 MED ORDER — LIDOCAINE-EPINEPHRINE (PF) 1 %-1:200000 IJ SOLN
INTRAMUSCULAR | Status: AC
  Filled 2017-06-29: qty 30

## 2017-06-29 MED ORDER — FAMOTIDINE 20 MG PO TABS: 40.0000 mg | ORAL_TABLET | ORAL | Status: DC | PRN

## 2017-06-29 MED ORDER — MIDAZOLAM HCL 2 MG/2ML IJ SOLN
INTRAMUSCULAR | Status: AC
  Filled 2017-06-29: qty 2

## 2017-06-29 MED ORDER — HYDROMORPHONE HCL 1 MG/ML IJ SOLN: 1.0000 mg | Freq: Once | INTRAMUSCULAR | Status: DC | PRN

## 2017-06-29 SURGICAL SUPPLY — 12 items
BALLN LUTONIX DCB 5X60X130 (BALLOONS) ×2
BALLOON LUTONIX DCB 5X60X130 (BALLOONS) ×1 IMPLANT
CANNULA 5F STIFF (CANNULA) ×2 IMPLANT
CATH BEACON 5 .035 40 KMP TP (CATHETERS) ×1 IMPLANT
CATH BEACON 5 .038 40 KMP TP (CATHETERS) ×1
COVER PROBE U/S 5X48 (MISCELLANEOUS) ×4 IMPLANT
DEVICE PRESTO INFLATION (MISCELLANEOUS) ×2 IMPLANT
DRAPE BRACHIAL (DRAPES) ×2 IMPLANT
PACK ANGIOGRAPHY (CUSTOM PROCEDURE TRAY) ×2 IMPLANT
SHEATH BRITE TIP 6FRX5.5 (SHEATH) ×2 IMPLANT
TOWEL OR 17X26 4PK STRL BLUE (TOWEL DISPOSABLE) ×2 IMPLANT
WIRE MAGIC TOR.035 180C (WIRE) ×2 IMPLANT

## 2017-06-29 NOTE — H&P (Signed)
[facility] Admission History & Physical  MRN : [id number]  Nina Johnston is a 57 y.o. (1960-05-10) female who presents with chief complaint of No chief complaint on file. Marland Kitchen  History of Present Illness: I am asked to evaluate the patient by the dialysis center. The patient was sent here because they were unable to achieve adequate dialysis this morning. Furthermore the Center states there is very poor thrill and bruit. The patient states there there have been increasing problems with the access, such as "pulling clots" during dialysis and prolonged bleeding after decannulation. The patient estimates these problems have been going on for several weeks. The patient is unaware of any other change.  Patient denies pain or tenderness overlying the access.  There is no pain with dialysis.  The patient denies hand pain or finger pain consistent with steal syndrome.   There have been many past interventions or declots of this access.  The patient is not chronically hypotensive on dialysis.  Current Facility-Administered Medications  Medication Dose Route Frequency Provider Last Rate Last Dose  . 0.9 %  sodium chloride infusion   Intravenous Continuous Stegmayer, Kimberly A, PA-C 10 mL/hr at 06/29/17 1121    . ceFAZolin (ANCEF) IVPB 1 g/50 mL premix  1 g Intravenous Once Stegmayer, Kimberly A, PA-C      . famotidine (PEPCID) tablet 40 mg  40 mg Oral PRN Stegmayer, Janalyn Harder, PA-C      . HYDROmorphone (DILAUDID) injection 1 mg  1 mg Intravenous Once PRN Stegmayer, Kimberly A, PA-C      . methylPREDNISolone sodium succinate (SOLU-MEDROL) 125 mg/2 mL injection 125 mg  125 mg Intravenous PRN Stegmayer, Kimberly A, PA-C      . ondansetron (ZOFRAN) injection 4 mg  4 mg Intravenous Q6H PRN Stegmayer, Janalyn Harder, PA-C        Past Medical History:  Diagnosis Date  . A-V fistula ([place])   . Acute bacterial sinusitis 04/19/2014  . Adaptive colitis   . Anginal pain ([place])   .  Anxiety   . Anxiety and depression   . Arthritis   . Asthma   . 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 Benign essential HTN 12/26/2014  . Bipolar disorder ([place])   . Bronchitis   . Carotid artery narrowing 12/22/2014  . Cervical prolapse   . CHF (congestive heart failure) ([place])   . CKD (chronic kidney disease)   . Concussion 01-14-14   Fall   . COPD (chronic obstructive pulmonary disease) ([place])   . Coronary artery disease   . Depression   . Diabetes mellitus without complication ([place])   . Dialysis patient ([place])   . Diplopia   . Dupuytren's contracture of foot   . Essential (primary) hypertension 02/12/2015  . Family history of thyroid problem   . GERD (gastroesophageal reflux disease)   . Gout   . Heart disease    enlarged because of COPD  . Heart murmur   . Hepatomegaly   . HLD (hyperlipidemia)   . Hyperactive airway disease   . Hyperkalemia   . Hypertension   . Hyperthyroidism   . Irritable colon   . Morbid obesity ([place])   . Multiple thyroid nodules   . OAB (overactive bladder)   . Pernicious anemia   . Post menopausal syndrome   . Post-concussion syndrome 03/08/2014  . Post-traumatic brain syndrome   . Prolapsed  uterus   . PSVT (paroxysmal supraventricular tachycardia) ([place])   . Renal calculus   . Renal disease    w/ GFR 29-may be due to diabetes  . Seizures ([place])    last seizure 2012. has had spells since with no knowledge. last was 1 month ago  . Sleep apnea   . 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 Transient alteration of awareness 11/10/2012  . Urinary incontinence with continuous leakage 03/08/2014    Past Surgical History:  Procedure Laterality Date  . A/V FISTULAGRAM Left 07/21/2016   Procedure: A/V Fistulagram;  Surgeon: Algernon Huxley, MD;  Location: [facility];  Service: Cardiovascular;  Laterality: Left;  . A/V FISTULAGRAM Left  12/24/2016   Procedure: A/V Fistulagram;  Surgeon: Algernon Huxley, MD;  Location: [facility];  Service: Cardiovascular;  Laterality: Left;  . A/V SHUNT INTERVENTION N/A 07/21/2016   Procedure: A/V Shunt Intervention;  Surgeon: Algernon Huxley, MD;  Location: [facility];  Service: Cardiovascular;  Laterality: N/A;  . A/V SHUNT INTERVENTION N/A 12/24/2016   Procedure: A/V SHUNT INTERVENTION;  Surgeon: Algernon Huxley, MD;  Location: [facility];  Service: Cardiovascular;  Laterality: N/A;  . AVF    . CHOLECYSTECTOMY    . COLONOSCOPY    . COLONOSCOPY WITH PROPOFOL N/A 03/18/2017   Procedure: COLONOSCOPY WITH PROPOFOL;  Surgeon: Manya Silvas, MD;  Location: [facility] END[facility]COPY;  Service: Endoscopy;  Laterality: N/A;  . ENDOMETRIAL BIOPSY     ablation, uterine  . ESOPHAGOGASTRODUODENOSCOPY (EGD) WITH PROPOFOL N/A 03/18/2017   Procedure: ESOPHAGOGASTRODUODENOSCOPY (EGD) WITH PROPOFOL;  Surgeon: Manya Silvas, MD;  Location: [facility] ENDOSCOPY;  Service: Endoscopy;  Laterality: N/A;  . HERNIA REPAIR    . HERNIA REPAIR    . PERIPHERAL VASCULAR CATHETERIZATION N/A 08/23/2015   Procedure: Dialysis/Perma Catheter Insertion;  Surgeon: Algernon Huxley, MD;  Location: [facility];  Service: Cardiovascular;  Laterality: N/A;  . PERIPHERAL VASCULAR CATHETERIZATION N/A 11/09/2015   Procedure: Dialysis/Perma Catheter Removal;  Surgeon: Katha Cabal, MD;  Location: [facility];  Service: Cardiovascular;  Laterality: N/A;  . PERIPHERAL VASCULAR CATHETERIZATION Left 04/23/2016   Procedure: A/V Fistulagram;  Surgeon: Algernon Huxley, MD;  Location: [facility];  Service: Cardiovascular;  Laterality: Left;  . PORT A CATH REVISION      Social History Social History   Tobacco Use  . Smoking status: Never Smoker  . Smokeless tobacco: Never Used  Substance Use Topics  . Alcohol use: No  . Drug use: No    Family History Family History  Problem Relation Age of Onset   . Diabetes Mother   . Lumbar disc disease Mother   . Migraines Mother   . Hypertension Mother   . Cancer - Prostate Father        mild  . Diabetes Father   . Hypertension Father   . Breast cancer Neg Hx   . Ovarian cancer Neg Hx   . Colon cancer Neg Hx   . Heart disease Neg Hx   . Kidney cancer Neg Hx   . Bladder Cancer Neg Hx     No family history of bleeding or clotting disorders, autoimmune disease or porphyria  Allergies  Allergen Reactions  . Cinnamon Other (See Comments)    Raises blood pressure  . Procrit [Epoetin Alfa] Other (See Comments)    Seizures, grand mal  .  Ace Inhibitors Cough  . Azithromycin Other (See Comments)    seizures  . Iron Other (See Comments)    Can not take in IV Form - causes seizures   . Neomycin-Bacitracin Zn-Polymyx Other (See Comments)    Unknown  . Sulfa Antibiotics Other (See Comments)    Due to risks for seizures.     REVIEW OF SYSTEMS (Negative unless checked)  Constitutional: [] Weight loss  [] Fever  [] Chills Cardiac: [] Chest pain   [] Chest pressure   [] Palpitations   [] Shortness of breath when laying flat   [] Shortness of breath at rest   [x] Shortness of breath with exertion. Vascular:  [] Pain in legs with walking   [] Pain in legs at rest   [] Pain in legs when laying flat   [] Claudication   [] Pain in feet when walking  [] Pain in feet at rest  [] Pain in feet when laying flat   [] History of DVT   [] Phlebitis   [] Swelling in legs   [] Varicose veins   [] Non-healing ulcers Pulmonary:   [] Uses home oxygen   [] Productive cough   [] Hemoptysis   [] Wheeze  [] COPD   [] Asthma Neurologic:  [] Dizziness  [] Blackouts   [] Seizures   [] History of stroke   [] History of TIA  [] Aphasia   [] Temporary blindness   [] Dysphagia   [] Weakness or numbness in arms   [] Weakness or numbness in legs Musculoskeletal:  [] Arthritis   [] Joint swelling   [] Joint pain   [] Low back pain Hematologic:  [] Easy bruising  [] Easy bleeding   [] Hypercoagulable state   [] Anemic   [] Hepatitis Gastrointestinal:  [] Blood in stool   [] Vomiting blood  [] Gastroesophageal reflux/heartburn   [] Difficulty swallowing. Genitourinary:  [x] Chronic kidney disease   [] Difficult urination  [] Frequent urination  [] Burning with urination   [] Blood in urine Skin:  [] Rashes   [] Ulcers   [] Wounds Psychological:  [] History of anxiety   []  History of major depression.  Physical Examination  Vitals:   06/29/17 1109  BP: (!) 151/69  Pulse: 76  Resp: 18  Temp: 97.7 F (36.5 C)  TempSrc: Oral  SpO2: 93%  Weight: 98.4 kg (217 lb)  Height: 5\' 2"  (1.575 m)   Body mass index is 39.69 kg/m. Gen: WD/WN, NAD Head: [place]/AT, No temporalis wasting. Ear/Nose/Throat: Hearing grossly intact, nares w/o erythema or drainage, oropharynx w/o Erythema/Exudate,  Eyes: Conjunctiva clear, sclera non-icteric Neck: Trachea midline.  No JVD.  Pulmonary:  Good air movement, respirations not labored, no use of accessory muscles.  Cardiac: RRR, normal S1, S2. Vascular: weak thrill in AVF Vessel Right Left  Radial Palpable Palpable               Musculoskeletal: M/S 5/5 throughout.  Extremities without ischemic changes.  No deformity or atrophy.  Neurologic: Sensation grossly intact in extremities.  Symmetrical.  Speech is fluent. Motor exam as listed above. Psychiatric: Judgment intact, Mood & affect appropriate for pt's clinical situation. Dermatologic: No rashes or ulcers noted.  No cellulitis or open wounds.    CBC Lab Results  Component Value Date   WBC 9.5 02/23/2017   HGB 8.0 (L) 06/12/2017   HCT 23.6 (L) 06/12/2017   MCV 99.1 02/23/2017   PLT 195 02/23/2017    BMET    Component Value Date/Time   NA 141 02/23/2017 1453   NA 142 03/08/2014 1634   NA 141 07/04/2011 0747   K 4.9 02/23/2017 1453   K 4.6 07/04/2011 0747   CL 103 02/23/2017 1453   CL 109 (H) 07/04/2011  0747   CO2 26 02/23/2017 1453   CO2 20 (L) 07/04/2011 0747   GLUCOSE 214 (H) 02/23/2017 1453   GLUCOSE 198 (H)  07/04/2011 0747   BUN 59 (H) 02/23/2017 1453   BUN 82 (HH) 03/08/2014 1634   BUN 65 (H) 07/04/2011 0747   CREATININE 6.07 (H) 02/23/2017 1453   CREATININE 1.95 (H) 07/04/2011 0747   CALCIUM 8.4 (L) 02/23/2017 1453   CALCIUM 8.6 07/04/2011 0747   GFRNONAA 7 (L) 02/23/2017 1453   GFRNONAA 29 (L) 07/04/2011 0747   GFRAA 8 (L) 02/23/2017 1453   GFRAA 35 (L) 07/04/2011 0747   CrCl cannot be calculated (Patient's most recent lab result is older than the maximum 21 days allowed.).  COAG Lab Results  Component Value Date   INR 0.96 04/10/2016   INR 1.0 07/04/2011    Radiology No results found.  Assessment/Plan 1.  Complication dialysis device with dysfunction of AV access:  Patient's left arm dialysis access is malfunctioning. The patient will undergo angiography and correction of any problems using interventional techniques with the hope of restoring function to the access.  The risks and benefits were described to the patient.  All questions were answered.  The patient agrees to proceed with angiography and intervention. Potassium will be drawn to ensure that it is an appropriate level prior to performing intervention. 2.  End-stage renal disease requiring hemodialysis:  Patient will continue dialysis therapy without further interruption if a successful intervention is not achieved then a tunneled catheter will be placed. Dialysis has already been arranged. 3.  Hypertension:  Patient will continue medical management; nephrology is following no changes in oral medications. 4. Diabetes mellitus:  Glucose will be monitored and oral medications been held this morning once the patient has undergone the patient's procedure po intake will be reinitiated and again Accu-Cheks will be used to assess the blood glucose level and treat as needed. The patient will be restarted on the patient's usual hypoglycemic regime     Leotis Pain, MD  06/29/2017 11:38 AM

## 2017-06-29 NOTE — Progress Notes (Signed)
Dr Lucky Cowboy in procedure, plan to speak with patient and family once available. Patient and family decided that they did not want to wait to speak with MD. Discharge instructions reviewed, follow up appt scheduled. Patient discharged per order.

## 2017-06-29 NOTE — Op Note (Signed)
[facility]    OPERATIVE NOTE   PROCEDURE: 1.   Left radiocephalic arteriovenous fistula cannulation under ultrasound guidance 2.   Left arm fistulagram including central venogram 3.   Catheter placement into left brachial artery retrograde through the fistula and left upper extremity angiogram 4.   Percutaneous transluminal angioplasty of the anastomosis and cephalic vein just beyond the anastomosis with 5 mm diameter by 6 cm length Lutonix drug-coated angioplasty balloon  PRE-OPERATIVE DIAGNOSIS: 1. ESRD 2. Poorly functional left radiocephalic AVF  POST-OPERATIVE DIAGNOSIS: same as above   SURGEON: Leotis Pain, MD  ANESTHESIA: local with MCS  ESTIMATED BLOOD LOSS: 5 cc  FINDING(S): 1. 70-75% stenosis of the anastomosis particularly within the cephalic vein just beyond the anastomosis.  Narrowing where the sheath was placed that was not seen on ultrasound prior to sheath placement.  Dual outflow in the upper arm with the basilic vein being dominant.  Patent central venous circulation without stenosis.  The arterial portion was patent in the brachial artery, ulnar artery, and radial artery to the anastomosis and the perianastomotic region where the above stenosis was present.  SPECIMEN(S):  None  CONTRAST: 30 cc  FLUORO TIME: 5.2 minutes  MODERATE CONSCIOUS SEDATION TIME: Approximately 30 minutes with 5 mg of Versed and 175 mcg of Fentanyl   INDICATIONS: Nina Johnston is a 57 y.o. female who presents with malfunctioning left radiocephalic arteriovenous fistula.  The patient is scheduled for left arm fistulagram.  The patient is aware the risks include but are not limited to: bleeding, infection, thrombosis of the cannulated access, and possible anaphylactic reaction to the contrast.  The patient is aware of the risks of the procedure and elects to proceed forward.  DESCRIPTION: After full informed written consent was obtained, the patient was brought  back to the angiography suite and placed supine upon the angiography table.  The patient was connected to monitoring equipment. Moderate conscious sedation was administered with a face to face encounter with the patient throughout the procedure with my supervision of the RN administering medicines and monitoring the patient's vital signs and mental status throughout from the start of the procedure until the patient was taken to the recovery room. The left arm was prepped and draped in the standard fashion for a percutaneous access intervention.  Under ultrasound guidance, the left radiocephalic arteriovenous fistula was cannulated with a micropuncture needle under direct ultrasound guidance in the proximal forearm in a retrograde fashion and a permanent image was performed.  The microwire was advanced into the fistula and the needle was exchanged for the a microsheath.  I then upsized to a 6 Fr Sheath and imaging was performed.  A Kumpe catheter and a Magic torque wire were used to cross the anastomosis and advance into the arterial system up to the brachial artery to evaluate the arterial inflow.  Hand injections were completed to image the access including the central venous system. This demonstrated 70-75% stenosis of the anastomosis particularly within the cephalic vein just beyond the anastomosis.  Narrowing where the sheath was placed that was not seen on ultrasound prior to sheath placement.  Dual outflow in the upper arm with the basilic vein being dominant.  Patent central venous circulation without stenosis.  The arterial portion was patent in the brachial artery, ulnar artery, and radial artery to the anastomosis and the perianastomotic region where the above stenosis was present.  Based on the images, this patient will need intervention to the anastomotic stenosis.  I then gave the patient 3000 units of intravenous heparin.  I then crossed the stenosis with a Magic Tourqe wire.  Based on the imaging, a  5 mm x 6 cm Lutonix drug-coated angioplasty balloon was selected.  The balloon was centered around the anastomotic stenosis with the tip of the balloon just into the radial artery and inflated to 14 ATM for 1 minute(s).  On completion imaging, a 20 % residual stenosis was present.     Based on the completion imaging, no further intervention is necessary.  The wire and balloon were removed from the sheath.  A 4-0 Monocryl purse-string suture was sewn around the sheath.  The sheath was removed while tying down the suture.  A sterile bandage was applied to the puncture site.  COMPLICATIONS: None  CONDITION: Stable   Leotis Pain  06/29/2017 1:58 PM   This note was created with Dragon Medical transcription system. Any errors in dictation are purely unintentional.

## 2017-06-30 ENCOUNTER — Telehealth: Payer: Self-pay | Admitting: Obstetrics and Gynecology

## 2017-06-30 ENCOUNTER — Encounter: Payer: Self-pay | Admitting: Vascular Surgery

## 2017-06-30 MED ORDER — FLUCONAZOLE 150 MG PO TABS: 150.0000 mg | ORAL_TABLET | Freq: Every day | ORAL | 0 refills | Status: DC

## 2017-06-30 NOTE — Telephone Encounter (Signed)
The patient called and stated that she has a bad yeast infection and she would like for Dr. Tennis Must or Joyice Faster to send in a prescription of Diflucan. The patient disclosed that her preferred pharmacy is [facility]. No other information disclosed. Please advise.

## 2017-06-30 NOTE — Telephone Encounter (Signed)
Pt aware per vm. Med erx.

## 2017-07-06 ENCOUNTER — Other Ambulatory Visit: Payer: Self-pay | Admitting: Neurology

## 2017-07-06 ENCOUNTER — Telehealth: Payer: Self-pay | Admitting: Neurology

## 2017-07-06 ENCOUNTER — Ambulatory Visit: Payer: Medicare Other | Admitting: Urology

## 2017-07-06 DIAGNOSIS — R4789 Other speech disturbances: Secondary | ICD-10-CM

## 2017-07-06 DIAGNOSIS — R404 Transient alteration of awareness: Secondary | ICD-10-CM

## 2017-07-06 DIAGNOSIS — R270 Ataxia, unspecified: Secondary | ICD-10-CM

## 2017-07-06 NOTE — Telephone Encounter (Signed)
Her pharmacy had already requested this refill earlier and it was already sent in. Pt should check with her pharmacy.

## 2017-07-06 NOTE — Telephone Encounter (Signed)
Pt calling for a refill of TEGRETOL-XR 100 MG 12 hr tablet , please send to  [place], [place] [phone] (Phone) [phone] (Fax)

## 2017-08-10 ENCOUNTER — Ambulatory Visit (INDEPENDENT_AMBULATORY_CARE_PROVIDER_SITE_OTHER): Payer: Medicare Other

## 2017-08-10 ENCOUNTER — Other Ambulatory Visit: Payer: Self-pay | Admitting: Neurology

## 2017-08-10 ENCOUNTER — Encounter (INDEPENDENT_AMBULATORY_CARE_PROVIDER_SITE_OTHER): Payer: Self-pay | Admitting: Vascular Surgery

## 2017-08-10 ENCOUNTER — Ambulatory Visit (INDEPENDENT_AMBULATORY_CARE_PROVIDER_SITE_OTHER): Payer: Medicare Other | Admitting: Vascular Surgery

## 2017-08-10 ENCOUNTER — Telehealth: Payer: Self-pay | Admitting: Neurology

## 2017-08-10 VITALS — BP 131/61 | HR 70 | Resp 16 | Ht 62.5 in | Wt 218.0 lb

## 2017-08-10 DIAGNOSIS — Z992 Dependence on renal dialysis: Secondary | ICD-10-CM

## 2017-08-10 DIAGNOSIS — N186 End stage renal disease: Secondary | ICD-10-CM

## 2017-08-10 DIAGNOSIS — D631 Anemia in chronic kidney disease: Secondary | ICD-10-CM

## 2017-08-10 MED ORDER — CARBAMAZEPINE ER 100 MG PO TB12: ORAL_TABLET | ORAL | 0 refills | Status: DC

## 2017-08-10 NOTE — Telephone Encounter (Signed)
I have called the pt back. I have forwarded the script to the pharmacy in the generic form since insurance has denied the medication.

## 2017-08-10 NOTE — Telephone Encounter (Signed)
Pts mother called stating that the insurance company will no longer cover TEGRETOL-XR 100 MG 12 hr tablet unless provided a letter stating the pt needs this medication. Pts mother also states that if no note can be provided than a new medication will need to be called in

## 2017-08-10 NOTE — Progress Notes (Signed)
Subjective:    Patient ID: Nina Johnston, female    DOB: [birth year], 57 y.o.   MRN: [id number] Chief Complaint  Patient presents with  . Follow-up    6 week F/U HDA   The patient presents for her first post procedure follow-up.  The patient is status post a left upper extremity fistulogram for poorly functioning dialysis access on June 29, 2017.  The patient seen with her parents.  The patient reports an improvement to the function of her dialysis access since her recent intervention. The patient underwent a duplex ultrasound of the AV access which was notable for a patent fistula without any significant hemodynamic stenosis. Hemodialysis doppler flow is 1302. The patient denies any issues with hemodialysis such as cannulation problems, increased bleeding, decrease in doppler flow or recirculation. The patient also denies any fistula skin breakdown, pain, edema, pallor or ulceration of the arm / hand.  The patient denies any fever, nausea vomiting.  Review of Systems  Constitutional: Negative.   HENT: Negative.   Eyes: Negative.   Respiratory: Negative.   Cardiovascular: Negative.   Gastrointestinal: Negative.   Endocrine: Negative.   Genitourinary:       ESRD  Musculoskeletal: Negative.   Skin: Negative.   Allergic/Immunologic: Negative.   Neurological: Negative.   Hematological: Negative.   Psychiatric/Behavioral: Negative.       Objective:   Physical Exam  Constitutional: She is oriented to person, place, and time. She appears well-developed and well-nourished. No distress.  HENT:  Head: Normocephalic and atraumatic.  Eyes: Pupils are equal, round, and reactive to light. Conjunctivae are normal.  Neck: Normal range of motion.  Cardiovascular: Normal rate, regular rhythm, normal heart sounds and intact distal pulses.  Pulses:      Radial pulses are 2+ on the right side, and 2+ on the left side.  Left upper extremity dialysis access: Good bruit and thrill.  Skin is  intact.  Pulmonary/Chest: Effort normal and breath sounds normal.  Musculoskeletal: Normal range of motion. She exhibits no edema.  Neurological: She is alert and oriented to person, place, and time.  Skin: Skin is warm and dry. She is not diaphoretic.  Psychiatric: She has a normal mood and affect. Her behavior is normal. Judgment and thought content normal.  Vitals reviewed.  BP 131/61 (BP Location: Right Arm, Patient Position: Sitting)   Pulse 70   Resp 16   Ht 5' 2.5" (1.588 m)   Wt 218 lb (98.9 kg)   BMI 39.24 kg/m   Past Medical History:  Diagnosis Date  . A-V fistula ([place])   . Acute bacterial sinusitis 04/19/2014  . Adaptive colitis   . Anginal pain ([place])   . Anxiety   . Anxiety and depression   . Arthritis   . Asthma   . 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 Benign essential HTN 12/26/2014  . Bipolar disorder ([place])   . Bronchitis   . Carotid artery narrowing 12/22/2014  . Cervical prolapse   . CHF (congestive heart failure) ([place])   . CKD (chronic kidney disease)   . Concussion 01-14-14   Fall   . COPD (chronic obstructive pulmonary disease) ([place])   . Coronary artery disease   . Depression   . Diabetes mellitus without complication ([place])   . Dialysis patient ([place])   . Diplopia   . Dupuytren's contracture of foot   . Essential (primary)  hypertension 02/12/2015  . Family history of thyroid problem   . GERD (gastroesophageal reflux disease)   . Gout   . Heart disease    enlarged because of COPD  . Heart murmur   . Hepatomegaly   . HLD (hyperlipidemia)   . Hyperactive airway disease   . Hyperkalemia   . Hypertension   . Hyperthyroidism   . Irritable colon   . Morbid obesity ([place])   . Multiple thyroid nodules   . OAB (overactive bladder)   . Pernicious anemia   . Post menopausal syndrome   . Post-concussion syndrome 03/08/2014  . Post-traumatic brain syndrome   . Prolapsed uterus     . PSVT (paroxysmal supraventricular tachycardia) ([place])   . Renal calculus   . Renal disease    w/ GFR 29-may be due to diabetes  . Seizures ([place])    last seizure 2012. has had spells since with no knowledge. last was 1 month ago  . Sleep apnea   . 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 Transient alteration of awareness 11/10/2012  . Urinary incontinence with continuous leakage 03/08/2014   Social History   Socioeconomic History  . Marital status: Single    Spouse name: Not on file  . Number of children: 0  . Years of education:  college  . Highest education level: Not on file  Occupational History  . 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 Financial resource strain: Not on file  . Food insecurity:    Worry: Not on file    Inability: Not on file  . Transportation needs:    Medical: Not on file    Non-medical: Not on file  Tobacco Use  . Smoking status: Never Smoker  . Smokeless tobacco: Never Used  Substance and Sexual Activity  . Alcohol use: No  . Drug use: No  . Sexual activity: Not Currently  Lifestyle  . Physical activity:    Days per week: Not on file    Minutes per session: Not on file  . Stress: Not on file  Relationships  . Social connections:    Talks on phone: Not on file    Gets together: Not on file    Attends religious service: Not on file    Active member of club or organization: Not on file    Attends meetings of clubs or organizations: Not on file    Relationship status: Not on file  . Intimate partner violence:    Fear of current or ex partner: Not on file    Emotionally abused: Not on file    Physically abused: Not on file    Forced sexual activity: Not on file  Other Topics Concern  . Not on file  Social History Narrative   Patient is single and lives with her parents.   Patient is disabled.   Patient has a college education.   Patient is right- handed.   Patient drinks tea occasionally. 2  glasses of tea when they eat out.   Past Surgical History:  Procedure Laterality Date  . A/V FISTULAGRAM Left 07/21/2016   Procedure: A/V Fistulagram;  Surgeon: Algernon Huxley, MD;  Location: [place] [facility];  Service: Cardiovascular;  Laterality: Left;  . A/V FISTULAGRAM Left 12/24/2016   Procedure: A/V Fistulagram;  Surgeon: Algernon Huxley, MD;  Location: [place] [facility];  Service: Cardiovascular;  Laterality: Left;  . A/V FISTULAGRAM Left 06/29/2017  Procedure: A/V FISTULAGRAM;  Surgeon: Algernon Huxley, MD;  Location: [facility];  Service: Cardiovascular;  Laterality: Left;  . A/V SHUNT INTERVENTION N/A 07/21/2016   Procedure: A/V Shunt Intervention;  Surgeon: Algernon Huxley, MD;  Location: [facility];  Service: Cardiovascular;  Laterality: N/A;  . A/V SHUNT INTERVENTION N/A 12/24/2016   Procedure: A/V SHUNT INTERVENTION;  Surgeon: Algernon Huxley, MD;  Location: [place] [facility];  Service: Cardiovascular;  Laterality: N/A;  . AVF    . CHOLECYSTECTOMY    . COLONOSCOPY    . COLONOSCOPY WITH PROPOFOL N/A 03/18/2017   Procedure: COLONOSCOPY WITH PROPOFOL;  Surgeon: Manya Silvas, MD;  Location: [facility];  Service: Endoscopy;  Laterality: N/A;  . ENDOMETRIAL BIOPSY     ablation, uterine  . ESOPHAGOGASTRODUODENOSCOPY (EGD) WITH PROPOFOL N/A 03/18/2017   Procedure: ESOPHAGOGASTRODUODENOSCOPY (EGD) WITH PROPOFOL;  Surgeon: Manya Silvas, MD;  Location: [facility] ENDOSCOPY;  Service: Endoscopy;  Laterality: N/A;  . HERNIA REPAIR    . HERNIA REPAIR    . PERIPHERAL VASCULAR CATHETERIZATION N/A 08/23/2015   Procedure: Dialysis/Perma Catheter Insertion;  Surgeon: Algernon Huxley, MD;  Location: [place] [facility];  Service: Cardiovascular;  Laterality: N/A;  . PERIPHERAL VASCULAR CATHETERIZATION N/A 11/09/2015   Procedure: Dialysis/Perma Catheter Removal;  Surgeon: Katha Cabal, MD;  Location: [place] [facility];  Service: Cardiovascular;  Laterality: N/A;  .  PERIPHERAL VASCULAR CATHETERIZATION Left 04/23/2016   Procedure: A/V Fistulagram;  Surgeon: Algernon Huxley, MD;  Location: [place] [facility];  Service: Cardiovascular;  Laterality: Left;  . PORT A CATH REVISION     Family History  Problem Relation Age of Onset  . Diabetes Mother   . Lumbar disc disease Mother   . Migraines Mother   . Hypertension Mother   . Cancer - Prostate Father        mild  . Diabetes Father   . Hypertension Father   . Breast cancer Neg Hx   . Ovarian cancer Neg Hx   . Colon cancer Neg Hx   . Heart disease Neg Hx   . Kidney cancer Neg Hx   . Bladder Cancer Neg Hx    Allergies  Allergen Reactions  . Cinnamon Other (See Comments)    Raises blood pressure  . Procrit [Epoetin Alfa] Other (See Comments)    Seizures, grand mal  . Ace Inhibitors Cough  . Azithromycin Other (See Comments)    seizures  . Iron Other (See Comments)    Can not take in IV Form - causes seizures   . Neomycin-Bacitracin Zn-Polymyx Other (See Comments)    Unknown  . Sulfa Antibiotics Other (See Comments)    Due to risks for seizures.      Assessment & Plan:  The patient presents for her first post procedure follow-up.  The patient is status post a left upper extremity fistulogram for poorly functioning dialysis access on June 29, 2017.  The patient seen with her parents.  The patient reports an improvement to the function of her dialysis access since her recent intervention. The patient underwent a duplex ultrasound of the AV access which was notable for a patent fistula without any significant hemodynamic stenosis. Hemodialysis doppler flow is 1302. The patient denies any issues with hemodialysis such as cannulation problems, increased bleeding, decrease in doppler flow or recirculation. The patient also denies any fistula skin breakdown, pain, edema, pallor or ulceration of the arm / hand.  The patient  denies any fever, nausea vomiting.  1. End-stage renal disease on hemodialysis  [facility]) Studies reviewed with patient. The patient is doing well and currently has adequate dialysis access. Duplex ultrasound of the AV access shows a patent access with no evidence of hemodynamically significant strictures or stenosis.  The patient should continue to have duplex ultrasounds of the dialysis access every six months. The patient was instructed to call the office in the interim if any issues with dialysis access / doppler flow, pain, edema, pallor, fistula skin breakdown or ulceration of the arm / hand occur. The patient expressed their understanding.  - VAS US DUPLEX DIALYSIS ACCESS (AVF,AVG); Future  2. Anemia due to chronic kidney disease, on chronic dialysis ([place]) - Stable Patient presents asymptomatically. This is followed by her nephrologist and primary care physician  Current Outpatient Medications on File Prior to Visit  Medication Sig Dispense Refill  . amLODipine (NORVASC) 5 MG tablet Take 5 mg by mouth See admin instructions. Take 5 mg by mouth daily except do not take on Tuesday, Thursday and Saturday.    Marland Kitchen aspirin 81 MG tablet Take 81 mg by mouth daily.     Marland Kitchen b complex vitamins tablet Take 1 tablet by mouth at bedtime.     . benzonatate (TESSALON) 200 MG capsule Take by mouth.    . clonazePAM (KLONOPIN) 1 MG tablet Take 1 tablet (1 mg total) by mouth at bedtime. 30 tablet 5  . cyanocobalamin (,VITAMIN B-12,) 1000 MCG/ML injection Inject 1,000 mcg into the muscle every 14 (fourteen) days.     Mariane Baumgarten Calcium (STOOL SOFTENER PO) Take 1 capsule by mouth every morning.     Marland Kitchen epoetin alfa (EPOGEN,PROCRIT) 3000 UNIT/ML injection Inject 3,000 Units into the vein every Monday, Wednesday, and Friday.    . ethosuximide (ZARONTIN) 250 MG/5ML solution TAKE 5ML BY MOUTH 2 TIMES DAILY (Patient taking differently: Take 250 mg by mouth 2 (two) times daily. ) 474 mL 3  . Febuxostat (ULORIC) 80 MG TABS Take 80 mg by mouth daily.     . fexofenadine (ALLEGRA) 180 MG tablet Take 180  mg by mouth daily.     . fluconazole (DIFLUCAN) 150 MG tablet Take 1 tablet (150 mg total) by mouth daily. 1 tablet 0  . Fluticasone-Salmeterol (ADVAIR DISKUS) 250-50 MCG/DOSE AEPB Inhale 1 puff into the lungs 2 (two) times daily.    . folic acid-vitamin b complex-vitamin c-selenium-zinc (DIALYVITE) 3 MG TABS tablet Take 1 tablet by mouth daily.    . furosemide (LASIX) 20 MG tablet Take 30 mg by mouth 2 (two) times daily.     . heparin 1000 unit/ml SOLN injection Inject into the peritoneum as needed.    . hydrALAZINE (APRESOLINE) 100 MG tablet Take 100 mg by mouth See admin instructions. Take 100 mg by mouth twice daily on non-dialysis days. Take 100 mg by mouth at bedtime on dialysis days Tuesday, Thursday and Saturday    . insulin glargine (LANTUS) 100 UNIT/ML injection Inject 28 Units into the skin at bedtime.     . insulin lispro (HUMALOG) 100 UNIT/ML injection Inject 8-16 Units into the skin 3 (three) times daily with meals. Per sliding scale    . lamoTRIgine (LAMICTAL) 200 MG tablet Take 1.5 tablets (300 mg total) by mouth 2 (two) times daily. 60 tablet 5  . levothyroxine (SYNTHROID) 150 MCG tablet Take 150 mcg by mouth daily before breakfast.     . metoprolol succinate (TOPROL XL) 50 MG 24 hr tablet Take  1 tablet (50 mg total) by mouth 2 (two) times daily. Take with or immediately following a meal. (Patient taking differently: Take 75 mg by mouth See admin instructions. Take 75 mg by mouth twice daily on non dialysis days. Take 75 mg by mouth at bedtime on dialysis days Tuesday, Thursday and Saturday.) 60 tablet 11  . montelukast (SINGULAIR) 10 MG tablet Take 10 mg by mouth at bedtime.    Marland Kitchen nystatin-triamcinolone (MYCOLOG II) cream Apply 1 application topically 2 (two) times daily. (Patient taking differently: Apply 1 application topically daily as needed (rash). ) 30 g 1  . pantoprazole (PROTONIX) 40 MG tablet Take 40 mg by mouth every morning.     . Probiotic Product (ALIGN) 4 MG CAPS Take 4  mg by mouth daily.     . sevelamer carbonate (RENVELA) 800 MG tablet Take 3,200 mg by mouth 3 (three) times daily with meals.     . TEGRETOL-XR 100 MG 12 hr tablet TAKE 2 TABLETS IN THE MORNING AND 1 TABLET IN THE EVENING 90 tablet 2  . triamcinolone (NASACORT ALLERGY 24HR) 55 MCG/ACT AERO nasal inhaler Place 2 sprays into the nose at bedtime.      No current facility-administered medications on file prior to visit.    There are no Patient Instructions on file for this visit. No follow-ups on file.  Amyjo Mizrachi A Autumne Kallio, PA-C

## 2017-08-10 NOTE — Telephone Encounter (Signed)
PA submitted through cover my meds through the [place]. The patient in cover my meds you have to enter it in [facility]. There is a space in between C and N. Also the number to call if needing help is [phone] option 5.  The PA can take up to 3 days before we hear back. KEY: [id number]

## 2017-08-11 ENCOUNTER — Other Ambulatory Visit: Payer: Self-pay | Admitting: Neurology

## 2017-08-11 MED ORDER — TEGRETOL-XR 100 MG PO TB12: ORAL_TABLET | ORAL | 2 refills | Status: DC

## 2017-08-11 NOTE — Telephone Encounter (Signed)
PA approved until 08/11/2018 for brand name medication through [facility]. KEY [id number]

## 2017-08-21 ENCOUNTER — Ambulatory Visit (INDEPENDENT_AMBULATORY_CARE_PROVIDER_SITE_OTHER): Payer: Medicare Other | Admitting: Vascular Surgery

## 2017-08-21 ENCOUNTER — Encounter (INDEPENDENT_AMBULATORY_CARE_PROVIDER_SITE_OTHER): Payer: Medicare Other

## 2017-09-23 ENCOUNTER — Ambulatory Visit (INDEPENDENT_AMBULATORY_CARE_PROVIDER_SITE_OTHER): Payer: Medicare Other | Admitting: Neurology

## 2017-09-23 ENCOUNTER — Encounter: Payer: Self-pay | Admitting: Neurology

## 2017-09-23 VITALS — BP 137/67 | HR 68 | Ht 62.0 in | Wt 214.0 lb

## 2017-09-23 DIAGNOSIS — Z992 Dependence on renal dialysis: Secondary | ICD-10-CM | POA: Diagnosis not present

## 2017-09-23 DIAGNOSIS — N186 End stage renal disease: Secondary | ICD-10-CM | POA: Diagnosis not present

## 2017-09-23 DIAGNOSIS — R4789 Other speech disturbances: Secondary | ICD-10-CM | POA: Diagnosis not present

## 2017-09-23 DIAGNOSIS — D631 Anemia in chronic kidney disease: Secondary | ICD-10-CM | POA: Diagnosis not present

## 2017-09-23 DIAGNOSIS — N039 Chronic nephritic syndrome with unspecified morphologic changes: Secondary | ICD-10-CM

## 2017-09-23 MED ORDER — TEGRETOL-XR 100 MG PO TB12: ORAL_TABLET | ORAL | 2 refills | Status: DC

## 2017-09-23 NOTE — Progress Notes (Signed)
[facility]  SLEEP MEDICINE CLINIC   Provider:  Dr Moira Umholtz Referring Provider: / Primary Care Physician:  Idelle Crouch, MD  Seizure like episodes - chief complaint.   HPI:  Nina Johnston is a 57 y.o. female here for a RV :  Interval history 09-23-2017, I have the pleasure of meeting with Nina Johnston in her family today, patient has been able to continue with regular hemodialysis and seems to be well dialyzed.  It took respiratory creation of an arteriovenous fistula to reach the patency, she had several revisions.  She had a spell on her way home last August another spell while in dialysis and then several short spells over September October and January.  Epogen shots started at [facility] on 11 February and she gets additional shots every 2 days until 25 June 2017.  Starting dose with 3000 units and she is now reaching 5000 units.  She is also taking oral iron and Pepcid to help with gastritis and any blood loss from there.  Over 2 months after Epogen was initiated she had a spell in the afternoon while riding in a car, Nina Johnston had to vomit and on 28 April the second spell at home that lasted about 10 minutes.  Her mother has witnessed both describes her as unresponsive to verbal haptic or acoustic stimuli or visual stimuli.  She seems to daydream she is breathing regularly there is no convulsion noted, and her mother has witnessed these on nondialysis days in the afternoon hours.  Her sugar has never been too low, she has not fallen she has not manifested any convulsions.  Given the almost 2 months time difference between Epogen and the spell occurrences I do not think that they are related I hope that the higher hemoglobin and hematocrit will help her having less lightheadedness less fatigue finding more energy overall.  At this time she is taking an oral iron citrate, and it seems not necessary to add IV iron.  I would like her to continue with the treatment of anemia as laid  out.  She may remain on 5000 units and oral iron. Keppra -     Interval history from 08 June 2017,  I have the pleasure of seeing Nina Johnston today, following a spell on January 29 during dialysis in which she stared off and was not responsive.  She also had a spell the same night at home witnessed by her mother.  There were further spells on August 1, August 31, September 12 and 14 as well as 22nd, October 16 and October 23.  Only 2 of these spells were prolonged with associated confusion or disorientation to her surroundings. Since he has saw her hematologist last Wednesday, her hematocrit and hemoglobin levels have been very low.  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  This occurred in 2011 and 2012.  She has not received any erythropoietin after that but I think she needs now.  In order to overcome this very severe anemia.  She has been transfused multiple times ( 9 times )  and that cannot be healthy either. Her hematologist and her nephrologist are both leading towards erythropoietin therapy again.  Her nephrologist is Audiological scientist, pathologist is Evelena Asa at [facility]. It's causing problems with dialysis effectiveness ( BP of 80/ 40 mmHg) , and with SOB.    CD_ This Caucasian right-handed, single female has been followed in this practice since  2009. 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  She had such spells in August 2010 and September 2011 after carbamazepine was changed to a generic form of Carbatrol her blood levels were lower,  she also developed upper respiratory tract infections and  pneumonia in 2011 and was unable for a while to use her CPAP which had been initiated in [place] for the treatment of obstructive sleep apnea. Paroxysmal events with mental status changes had continued,  03/17/2011  and 20 13 some of the spells are described as " automatisms"- continuing behavior while  the patient is staring off and acting without  reflecting not being  responsive to stimulation from the outside . After the spell passes, she became very sleepy each time. In August 2012 she was finally referred to an endocrinologist at [facility] after she had a hypoglycemic seizure and her diabetes medications were reduced, now her blood sugars are running between 150 and 200 in the morning,  fasting.[name] begun working out in 2013 with a Physiological scientist and was able to lose about 30 pounds . She returned to using her CPAP.her Hb A1 C. in October of last year was 6.1- she had no other hypoglycemic events reported since March 2013 . Also in 2013 she had to visit the local ER twice for abdominal spasms and cardiac / chest pain. negative work up. She was developing renal failure.  She has since been followed for a decreased renal clearance by internal medicine and nephrology. The patient is treated for her spells  and remains on Tegretol. It she will need blood levels for her anti-epiletic medications. Dr. Geryl Councilman is debating to reduce her Klonopin , which may help reduce her fall risk as well. Vit D deficiency- takes supplement.    Note contact at [facility]; transplant. Dr . Talmadge Coventry,  [place] [facility] [place] [phone]. History from 04/16/2016. Nina Johnston is here today for a routine revisit but she did have some healthcare over the last week. She is now on hemodialysis, and experienced bleeding at the AV fistula of her left arm during the last treatment. She notice that the patella was blood drenched but her treatment time was almost over. She also had to be evaluated at the local hospital emergency room for a  malignant blood pressure peak. Her systolic blood pressure went up , she had a very high heart rate between 150s and 160 bpm. The ED suspected that she had atrial flutter, and she had hypokalemia. Hgb was 8.2.  on last Monday she was seen by cardiology and told she had sinus tachycardia. Chest Xray as she coughed blood. mammogram was due, normal, and potassium /agnesium was normalized.   Interval history from 12/03/2016. I have pleasure of seeing Nina Johnston today who has been on hemodialysis since 18 month ago, she has a left arm AV fistula for hemodialysis access, her blood pressures have been lower especially in treatment and she was asked not to take hypertension medications prior to treatment. She will take the medication either after or the day before. I have The patient on antiepileptic medications after she had recurrent convulsions but we also found a correlation to hypoglycemia.  She has remained on Tegretol. She continues to take Lamictal.  New problem: tachycardia since blood transfusion. Her hemoglobin was 6.9, her pulse rate after transfusion was well in the 120s. Pulse rate addressed today was 120. Please note that the patient had also tachycardia up to rates between 150s and 160 in December 2017. At that time she was diagnosed with hypokalemia, her hemoglobin then was 8.2. She needs to speak to her nephrologist.   Review of Systems: Out of a complete 14 system review, the patient complains of only the following symptoms:       Social History   Socioeconomic History  . Marital status: Single    Spouse name: Not on file  . Number of children: 0  . Years of education:  college  . Highest education level: Not on file  Occupational History  . 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 Financial resource strain: Not on file  . Food insecurity:    Worry: Not on file    Inability: Not on file  . Transportation needs:    Medical: Not on file    Non-medical: Not on file   Tobacco Use  . Smoking status: Never Smoker  . Smokeless tobacco: Never Used  Substance and Sexual Activity  . Alcohol use: No  . Drug use: No  . Sexual activity: Not Currently  Lifestyle  . Physical activity:    Days per week: Not on file    Minutes per session: Not on file  . Stress: Not on file  Relationships  . Social connections:    Talks on phone: Not on file    Gets together: Not on file    Attends religious service: Not on file    Active member of club or organization: Not on file    Attends meetings of clubs or organizations: Not on file    Relationship status: Not on file  . Intimate partner violence:    Fear of current or ex partner: Not on file    Emotionally abused: Not on file    Physically abused: Not on file    Forced sexual activity: Not on file  Other Topics Concern  . Not on file  Social History Narrative   Patient is single and lives with her parents.   Patient is disabled.   Patient has a college education.   Patient is right- handed.   Patient drinks tea occasionally. 2 glasses of tea when they eat out.    Family History  Problem Relation Age of Onset  . Diabetes Mother   . Lumbar disc disease Mother   . Migraines Mother   . Hypertension Mother   . Cancer - Prostate Father        mild  . Diabetes Father   . Hypertension Father   . Breast cancer Neg Hx   . Ovarian cancer Neg Hx   . Colon cancer Neg Hx   . Heart disease Neg Hx   . Kidney cancer Neg Hx   . Bladder Cancer Neg Hx     Past Medical History:  Diagnosis Date  . A-V fistula ([place])   . Acute bacterial sinusitis 04/19/2014  . Adaptive colitis   . Anginal pain ([place])   . Anxiety   . Anxiety and depression   . Arthritis   . Asthma   . 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 Benign essential HTN 12/26/2014  . Bipolar disorder ([place])   . Bronchitis   .  Carotid artery narrowing 12/22/2014  . Cervical prolapse    . CHF (congestive heart failure) ([place])   . CKD (chronic kidney disease)   . Concussion 01-14-14   Fall   . COPD (chronic obstructive pulmonary disease) ([place])   . Coronary artery disease   . Depression   . Diabetes mellitus without complication ([place])   . Dialysis patient ([place])   . Diplopia   . Dupuytren's contracture of foot   . Essential (primary) hypertension 02/12/2015  . Family history of thyroid problem   . GERD (gastroesophageal reflux disease)   . Gout   . Heart disease    enlarged because of COPD  . Heart murmur   . Hepatomegaly   . HLD (hyperlipidemia)   . Hyperactive airway disease   . Hyperkalemia   . Hypertension   . Hyperthyroidism   . Irritable colon   . Morbid obesity ([place])   . Multiple thyroid nodules   . OAB (overactive bladder)   . Pernicious anemia   . Post menopausal syndrome   . Post-concussion syndrome 03/08/2014  . Post-traumatic brain syndrome   . Prolapsed uterus   . PSVT (paroxysmal supraventricular tachycardia) ([place])   . Renal calculus   . Renal disease    w/ GFR 29-may be due to diabetes  . Seizures ([place])    last seizure 2012. has had spells since with no knowledge. last was 1 month ago  . Sleep apnea   . 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 Transient alteration of awareness 11/10/2012  . Urinary incontinence with continuous leakage 03/08/2014    Past Surgical History:  Procedure Laterality Date  . A/V FISTULAGRAM Left 07/21/2016   Procedure: A/V Fistulagram;  Surgeon: Algernon Huxley, MD;  Location: [place];  Service: Cardiovascular;  Laterality: Left;  . A/V FISTULAGRAM Left 12/24/2016   Procedure: A/V Fistulagram;  Surgeon: Algernon Huxley, MD;  Location: [facility];  Service: Cardiovascular;  Laterality: Left;  . A/V FISTULAGRAM Left 06/29/2017   Procedure: A/V FISTULAGRAM;  Surgeon: Algernon Huxley, MD;  Location: [place] [facility];  Service: Cardiovascular;  Laterality: Left;   . A/V SHUNT INTERVENTION N/A 07/21/2016   Procedure: A/V Shunt Intervention;  Surgeon: Algernon Huxley, MD;  Location: [place] [facility];  Service: Cardiovascular;  Laterality: N/A;  . A/V SHUNT INTERVENTION N/A 12/24/2016   Procedure: A/V SHUNT INTERVENTION;  Surgeon: Algernon Huxley, MD;  Location: [place] [facility];  Service: Cardiovascular;  Laterality: N/A;  . AVF    . CHOLECYSTECTOMY    . COLONOSCOPY    . COLONOSCOPY WITH PROPOFOL N/A 03/18/2017   Procedure: COLONOSCOPY WITH PROPOFOL;  Surgeon: Manya Silvas, MD;  Location: [facility] ENDOSCOPY;  Service: Endoscopy;  Laterality: N/A;  . ENDOMETRIAL BIOPSY     ablation, uterine  . ESOPHAGOGASTRODUODENOSCOPY (EGD) WITH PROPOFOL N/A 03/18/2017   Procedure: ESOPHAGOGASTRODUODENOSCOPY (EGD) WITH PROPOFOL;  Surgeon: Manya Silvas, MD;  Location: [facility] ENDOSCOPY;  Service: Endoscopy;  Laterality: N/A;  . HERNIA REPAIR    . HERNIA REPAIR    . PERIPHERAL VASCULAR CATHETERIZATION N/A 08/23/2015   Procedure: Dialysis/Perma Catheter Insertion;  Surgeon: Algernon Huxley, MD;  Location: [place] [facility];  Service: Cardiovascular;  Laterality: N/A;  . PERIPHERAL VASCULAR CATHETERIZATION N/A 11/09/2015   Procedure: Dialysis/Perma Catheter Removal;  Surgeon: Katha Cabal, MD;  Location: [place] [facility];  Service: Cardiovascular;  Laterality: N/A;  .  PERIPHERAL VASCULAR CATHETERIZATION Left 04/23/2016   Procedure: A/V Fistulagram;  Surgeon: Algernon Huxley, MD;  Location: [place] [facility];  Service: Cardiovascular;  Laterality: Left;  . PORT A CATH REVISION      Current Outpatient Medications  Medication Sig Dispense Refill  . famotidine (PEPCID) 40 MG tablet Take by mouth.    Marland Kitchen amLODipine (NORVASC) 5 MG tablet Take 5 mg by mouth See admin instructions. Take 5 mg by mouth daily except do not take on Tuesday, Thursday and Saturday.    Marland Kitchen aspirin 81 MG tablet Take 81 mg by mouth daily.     Marland Kitchen b complex vitamins tablet Take 1 tablet by mouth at  bedtime.     . clonazePAM (KLONOPIN) 1 MG tablet Take 1 tablet (1 mg total) by mouth at bedtime. 30 tablet 5  . cyanocobalamin (,VITAMIN B-12,) 1000 MCG/ML injection Inject 1,000 mcg into the muscle every 14 (fourteen) days.     Mariane Baumgarten Calcium (STOOL SOFTENER PO) Take 1 capsule by mouth every morning.     Marland Kitchen epoetin alfa (EPOGEN,PROCRIT) 3000 UNIT/ML injection Inject 3,000 Units into the vein every Monday, Wednesday, and Friday.    . ethosuximide (ZARONTIN) 250 MG/5ML solution TAKE 5ML BY MOUTH 2 TIMES DAILY (Patient taking differently: Take 250 mg by mouth 2 (two) times daily. ) 474 mL 3  . Febuxostat (ULORIC) 80 MG TABS Take 80 mg by mouth daily.     . ferric citrate (AURYXIA) 1 GM 210 MG(Fe) tablet Take 2 tablets by mouth 3 (three) times daily.    . fexofenadine (ALLEGRA) 180 MG tablet Take 180 mg by mouth daily.     . fluconazole (DIFLUCAN) 150 MG tablet Take 1 tablet (150 mg total) by mouth daily. 1 tablet 0  . Fluticasone-Salmeterol (ADVAIR DISKUS) 250-50 MCG/DOSE AEPB Inhale 1 puff into the lungs 2 (two) times daily.    . folic acid-vitamin b complex-vitamin c-selenium-zinc (DIALYVITE) 3 MG TABS tablet Take 1 tablet by mouth daily.    . furosemide (LASIX) 20 MG tablet Take 30 mg by mouth 2 (two) times daily.     . heparin 1000 unit/ml SOLN injection Inject into the peritoneum as needed.    . hydrALAZINE (APRESOLINE) 100 MG tablet Take 100 mg by mouth See admin instructions. Take 100 mg by mouth twice daily on non-dialysis days. Take 100 mg by mouth at bedtime on dialysis days Tuesday, Thursday and Saturday    . insulin glargine (LANTUS) 100 UNIT/ML injection Inject 22 Units into the skin at bedtime.     . insulin lispro (HUMALOG) 100 UNIT/ML injection Inject 10-15 Units into the skin 3 (three) times daily with meals. Per sliding scale    . lamoTRIgine (LAMICTAL) 200 MG tablet Take 1.5 tablets (300 mg total) by mouth 2 (two) times daily. 60 tablet 5  . levothyroxine (SYNTHROID) 150 MCG  tablet Take 150 mcg by mouth daily before breakfast.     . metoprolol succinate (TOPROL XL) 50 MG 24 hr tablet Take 1 tablet (50 mg total) by mouth 2 (two) times daily. Take with or immediately following a meal. (Patient taking differently: Take 75 mg by mouth See admin instructions. Take 75 mg by mouth twice daily on non dialysis days. Take 75 mg by mouth at bedtime on dialysis days Tuesday, Thursday and Saturday.) 60 tablet 11  . montelukast (SINGULAIR) 10 MG tablet Take 10 mg by mouth at bedtime.    Marland Kitchen nystatin-triamcinolone (MYCOLOG II) cream Apply 1 application topically 2 (  two) times daily. (Patient taking differently: Apply 1 application topically daily as needed (rash). ) 30 g 1  . pantoprazole (PROTONIX) 40 MG tablet Take 40 mg by mouth every morning.     . Probiotic Product (ALIGN) 4 MG CAPS Take 4 mg by mouth daily.     . TEGRETOL-XR 100 MG 12 hr tablet Take 2 tablets in the morning and then 1 tablet in the evening 180 tablet 2  . triamcinolone (NASACORT ALLERGY 24HR) 55 MCG/ACT AERO nasal inhaler Place 2 sprays into the nose at bedtime.      No current facility-administered medications for this visit.     Allergies as of 09/23/2017 - Review Complete 09/23/2017  Allergen Reaction Noted  . Cinnamon Other (See Comments) 11/09/2012  . Procrit [epoetin alfa] Other (See Comments) 11/10/2012  . Ace inhibitors Cough 09/06/2014  . Azithromycin Other (See Comments) 09/06/2014  . Iron Other (See Comments) 03/31/2016  . Neomycin-bacitracin zn-polymyx Other (See Comments) 09/06/2014  . Sulfa antibiotics Other (See Comments) 09/06/2014    Vitals: BP 137/67   Pulse 68   Ht 5\' 2"  (1.575 m)   Wt 214 lb (97.1 kg)   BMI 39.14 kg/m  Last Weight:  Wt Readings from Last 1 Encounters:  09/23/17 214 lb (97.1 kg)   Last Height:   Ht Readings from Last 1 Encounters:  09/23/17 5\' 2"  (1.575 m)   Vision Screening:   Physical exam:  General: The patient is awake, alert and appears not in acute  distress. The patient is well groomed. Head: Normocephalic, atraumatic. Neck is supple.  Mallampati 1 - there is no uvula , neck circumference:15.5 inches.   On renal low potassium diet, she lost 45 pounds .  Cardiovascular:  Regular rate and rhythm, without  murmurs or carotid bruit, and without distended neck veins. Respiratory: Lungs are clear to auscultation. Skin: ankle  edema, no rash. Looking jaundiced, bronzed .  Dry skin, low TURGOR, appears dehydrated and dusky. Bronzed skin. .  Trunk: BMI is morbidly obese-  Abdominal hernia.  patient has normal posture.   Neurologic exam : The patient is awake and alert, oriented to place and time. Memory subjective described as intact.  There is a normal attention span & concentration ability. Speech is fluent without dysarthria or aphasia. Mood and affect are appropriate. Cranial nerves: Pupils are equal and briskly reactive to light. No disrounded pupils,  Yellowish sclerea.   Visual fields by finger perimetry are intact.Hearing to finger rub intact. Facial sensation intact to fine touch. Facial motor strength is symmetric and tongue moves midline. Motor exam:  Normal tone and normal muscle bulk, obesity related attenuated DTR , and symmetric normal strength in all extremities. She has an AV access fistula on the left arm. Thrill.  Sensory:  Fine touch, pinprick and vibration were tested in all extremities and are presents in toes and feet. Proprioception is tested and normal. Coordination: Rapid alternating movements in the fingers/hands is tested and normal. Finger-to-nose maneuver tested and normal without evidence of ataxia, dysmetria or tremor. Gait and station: Patient walks without assistive device and is very slow - Appears truncally rigid, with no lumbar rotation, turns with 5 steps, and has reduced step width. Strength within normal limits. Stance is stable and normal. Tandem gait is fragmented. She waddels.  Deep tendon reflexes: in the  upper and lower extremities are symmetric and intact.  Babinski maneuver response is down going on the left and equivocal on the right.    I  was quite concerned about Nina Johnston's anemia and her constant need for transfusions.  In the past I was very worried about erythropoietin because it seems to be time correlated to the few generalized tonic-clonic seizures she has suffered. Erythropoietin was re initiated  At 3.000 units and gradually up to 5.000IU. and she regained Hgb and is less pale, but she had 2 spells 2 month later- I don't think these are related. I  Want her to continue both erythropoetin and oral iron.   These spells occur in the afternoon on non dialysis days, starting as repeated swallowing , she is not responding to visual a or verbal stimuli - but she breathes regularly . The glucose is usually high, she takes AURYXIA for iron supplement and Pepcid. Unexplained      Recurrent convulsions due to hypoglycemia, hypotension on dialysis, but not due to erythropoetin.    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 creatinine is around 7.  Morbidly obese. Increasing weight- new dry weight 95.5 Kg  Keep on anti epileptic meds, continue citric acid iron po. She may try a trial dose of IV iron, ferrolicit or similar.   Nina Seat, MD   09-23-2017

## 2017-09-23 NOTE — Patient Instructions (Signed)
Dialysis Diet Dialysis is a treatment that you may undergo if you have significant damage to your kidneys. Dialysis replaces some of the work that the kidneys do. One of the jobs that it takes over is removing wastes, salt, and extra water from your blood. This helps to keep the amount of potassium and other nutrients in your blood at healthy levels. When you need dialysis, it is important to pay careful attention to your diet. Between dialysis sessions, certain nutrients can build up in your blood and cause you to get sick. Vitamins and minerals are an important part of a healthy diet and should not be avoided entirely. However, it is commonly recommended that you limit your intake of potassium, phosphorus, and sodium. It may also be necessary to restrict other nutrients, such as carbohydrates or fat, if you have other health conditions. Your health care provider or dietitian can help you to determine the amount of these nutrients that is right for you. What is my plan? Your dietitian will help you to design a meal plan that is specific to your needs. Generally, meal plans include:  Grains, 6-11 servings per day. One serving is equal to 1 slice of bread or  cup of cooked rice or pasta.  Low-potassium vegetables, 2-3 servings per day. One serving is equal to  cup.  Low-potassium fruits, 2-3 servings per day. One serving is equal to  cup.  Meats and other protein sources, 8-11 oz per day.  Dairy,  cup per day.  Your dietitian will provide you with specific instructions about the amount of fluids you can have each day. What do I need to know about a dialysis diet?  Limit your intake of potassium. Potassium is found in milk, fruits, and vegetables.  Limit your intake of phosphorus. Phosphorus is found in milk, cheese, beans, nuts, and carbonated beverages. Avoid whole-grain and high-fiber foods because they contain high amounts of phosphorus.  Limit your intake of sodium. Foods that are high  in sodium include processed and cured meats, ready-made frozen meals, canned vegetables, and salty snack foods. Do not use salt substitutes because they contain potassium.  If you were instructed to restrict your fluid intake, follow your health care provider's specific instructions. You may be told to: ? Write down what you drink and any foods you eat that are made mostly from water, such as gelatin and soups. ? Drink from small cups to help control how much you drink.  Ask your health care provider if you should regularly take an over-the-counter medicine that binds phosphorus, such as antacid products that contain calcium carbonate.  Take vitamin and mineral supplements only as directed by your health care provider.  Eat high-quality proteins, such as meat, poultry, fish, and eggs. Limit low-quality plant-based proteins, such as nuts and beans.  Cut potatoes into small pieces and boil them in unsalted water before you eat them. This can help to remove some potassium from the potato.  Drain all fluid from cooked vegetables and canned fruits before eating them. What foods can I eat? Grains White bread. White rice. Cooked cereal. Unsalted popcorn. Tortillas. Pasta. Vegetables Fresh or frozen broccoli, carrots, and green beans. Cabbage. Cauliflower. Celery. Cucumbers. Eggplant. Radishes. Zucchini. Fruits Apples. Fresh or frozen berries. Fresh or canned pears, peaches, and pineapple. Grapes. Plums. Meats and Other Protein Sources Fresh or frozen beef, pork, chicken, and fish. Eggs. Low-sodium canned tuna or salmon. Dairy Cream cheese. Heavy cream. Ricotta cheese. Beverages Apple cider. Cranberry juice. Grape juice.  Lemonade. Black coffee. Condiments Herbs. Spices. Jam and jelly. Honey. Sweets and Desserts Sherbet. Cakes. Cookies. Fats and Oils Olive oil, canola oil, and safflower oil. Other Non-dairy creamer. Non-dairy whipped topping. Homemade broth without salt. The items listed  above may not be a complete list of recommended foods or beverages. Contact your dietitian for more options. What foods are not recommended? Grains Whole-grain bread. Whole-grain pasta. High-fiber cereal. Vegetables Potatoes. Beets. Tomatoes. Winter squash and pumpkin. Asparagus. Spinach. Parsnips. Fruits Star fruit. Bananas. Oranges. Kiwi. Nectarines. Prunes. Melon. Dried fruit. Avocado. Meats and Other Protein Sources Canned, smoked, and cured meats. Soil scientist. Sardines. Nuts and seeds. Peanut butter. Beans and legumes. Dairy Milk. Buttermilk. Yogurt. Cheese and cottage cheese. Processed cheese spreads. Beverages Orange juice. Prune juice. Carbonated soft drinks. Condiments Salt. Salt substitutes. Soy sauce. Sweets and Desserts Ice cream. Chocolate. Candied nuts. Fats and Oils Butter. Margarine. Other Ready-made frozen meals. Canned soups. The items listed above may not be a complete list of foods and beverages to avoid. Contact your dietitian for more information. This information is not intended to replace advice given to you by your health care provider. Make sure you discuss any questions you have with your health care provider. Document Released: 01/17/2004 Document Revised: 08/30/2015 Document Reviewed: 11/22/2013 Elsevier Interactive Patient Education  [employer].

## 2017-09-29 ENCOUNTER — Other Ambulatory Visit: Payer: Self-pay | Admitting: Neurology

## 2017-10-01 ENCOUNTER — Telehealth: Payer: Self-pay | Admitting: Neurology

## 2017-10-01 ENCOUNTER — Other Ambulatory Visit: Payer: Self-pay | Admitting: Neurology

## 2017-10-01 MED ORDER — LAMICTAL 200 MG PO TABS: 300.0000 mg | ORAL_TABLET | Freq: Two times a day (BID) | ORAL | 5 refills | Status: DC

## 2017-10-01 NOTE — Telephone Encounter (Signed)
Dawn with [facility] called requesting a new prescription for name brand lamoTRIgine (LAMICTAL) 200 MG tablet stating insurance will not cover unless written for name brand. Dawn can be reached at [phone] for any questions

## 2017-10-01 NOTE — Telephone Encounter (Signed)
Order sent to the pharmacy for 90 tablets and DAW on the script.

## 2017-10-24 ENCOUNTER — Other Ambulatory Visit: Payer: Self-pay

## 2017-10-24 ENCOUNTER — Encounter: Payer: Self-pay | Admitting: Emergency Medicine

## 2017-10-24 ENCOUNTER — Emergency Department
Admission: EM | Admit: 2017-10-24 | Discharge: 2017-10-25 | Disposition: A | Discharge status: Home / Self Care | Payer: Medicare Other | Attending: Emergency Medicine | Admitting: Emergency Medicine

## 2017-10-24 DIAGNOSIS — I509 Heart failure, unspecified: Secondary | ICD-10-CM | POA: Diagnosis not present

## 2017-10-24 DIAGNOSIS — Z992 Dependence on renal dialysis: Secondary | ICD-10-CM | POA: Insufficient documentation

## 2017-10-24 DIAGNOSIS — Z7982 Long term (current) use of aspirin: Secondary | ICD-10-CM | POA: Insufficient documentation

## 2017-10-24 DIAGNOSIS — R739 Hyperglycemia, unspecified: Secondary | ICD-10-CM

## 2017-10-24 DIAGNOSIS — E1165 Type 2 diabetes mellitus with hyperglycemia: Secondary | ICD-10-CM | POA: Insufficient documentation

## 2017-10-24 DIAGNOSIS — J449 Chronic obstructive pulmonary disease, unspecified: Secondary | ICD-10-CM | POA: Insufficient documentation

## 2017-10-24 DIAGNOSIS — N186 End stage renal disease: Secondary | ICD-10-CM | POA: Insufficient documentation

## 2017-10-24 DIAGNOSIS — Z79899 Other long term (current) drug therapy: Secondary | ICD-10-CM | POA: Insufficient documentation

## 2017-10-24 DIAGNOSIS — Z794 Long term (current) use of insulin: Secondary | ICD-10-CM | POA: Diagnosis not present

## 2017-10-24 DIAGNOSIS — R05 Cough: Secondary | ICD-10-CM | POA: Diagnosis present

## 2017-10-24 DIAGNOSIS — I132 Hypertensive heart and chronic kidney disease with heart failure and with stage 5 chronic kidney disease, or end stage renal disease: Secondary | ICD-10-CM | POA: Insufficient documentation

## 2017-10-24 DIAGNOSIS — E1122 Type 2 diabetes mellitus with diabetic chronic kidney disease: Secondary | ICD-10-CM | POA: Diagnosis not present

## 2017-10-24 DIAGNOSIS — E039 Hypothyroidism, unspecified: Secondary | ICD-10-CM | POA: Diagnosis not present

## 2017-10-24 DIAGNOSIS — N390 Urinary tract infection, site not specified: Secondary | ICD-10-CM | POA: Diagnosis not present

## 2017-10-24 LAB — GLUCOSE, CAPILLARY
Glucose-Capillary: 172 mg/dL — ABNORMAL HIGH (ref 65–99)
Glucose-Capillary: 225 mg/dL — ABNORMAL HIGH (ref 65–99)

## 2017-10-24 LAB — BASIC METABOLIC PANEL
Anion gap: 10 (ref 5–15)
BUN: 17 mg/dL (ref 6–20)
CO2: 31 mmol/L (ref 22–32)
Calcium: 9.4 mg/dL (ref 8.9–10.3)
Chloride: 99 mmol/L — ABNORMAL LOW (ref 101–111)
Creatinine, Ser: 2.96 mg/dL — ABNORMAL HIGH (ref 0.44–1.00)
GFR calc Af Amer: 19 mL/min — ABNORMAL LOW (ref >60)
GFR calc non Af Amer: 17 mL/min — ABNORMAL LOW (ref >60)
Glucose, Bld: 217 mg/dL — ABNORMAL HIGH (ref 65–99)
Potassium: 3.9 mmol/L (ref 3.5–5.1)
Sodium: 140 mmol/L (ref 135–145)

## 2017-10-24 LAB — CBC
HCT: 32.1 % — ABNORMAL LOW (ref 35.0–47.0)
Hemoglobin: 10.7 g/dL — ABNORMAL LOW (ref 12.0–16.0)
MCH: 33.2 pg (ref 26.0–34.0)
MCHC: 33.3 g/dL (ref 32.0–36.0)
MCV: 99.9 fL (ref 80.0–100.0)
Platelets: 183 10*3/uL (ref 150–440)
RBC: 3.22 MIL/uL — ABNORMAL LOW (ref 3.80–5.20)
RDW: 15.5 % — ABNORMAL HIGH (ref 11.5–14.5)
WBC: 13 10*3/uL — ABNORMAL HIGH (ref 3.6–11.0)

## 2017-10-24 NOTE — ED Provider Notes (Signed)
[facility] Emergency Department Provider Note ____________________________________________   First MD Initiated Contact with Patient 10/24/17 2335     (approximate)  I have reviewed the triage vital signs and the nursing notes.   HISTORY  Chief Complaint Cough and Hyperglycemia    HPI Nina Johnston is a 57 y.o. female with PMH as noted below including ESRD on dialysis and diabetes who presents for primary complaint of hyperglycemia, acute onset this evening after dialysis, and measured to 599.  The patient took 15 units of Humalog after dinner and the sugar was still in the 500s.  The patient denies any lightheadedness or weakness or other acute symptoms.  She states that she has had a cough for last several weeks and has been on an antibiotic and has a few days left.  Past Medical History:  Diagnosis Date  . A-V fistula ([place])   . Acute bacterial sinusitis 04/19/2014  . Adaptive colitis   . Anginal pain ([place])   . Anxiety   . Anxiety and depression   . Arthritis   . Asthma   . 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 Benign essential HTN 12/26/2014  . Bipolar disorder ([place])   . Bronchitis   . Carotid artery narrowing 12/22/2014  . Cervical prolapse   . CHF (congestive heart failure) ([place])   . CKD (chronic kidney disease)   . Concussion 01-14-14   Fall   . COPD (chronic obstructive pulmonary disease) ([place])   . Coronary artery disease   . Depression   . Diabetes mellitus without complication ([place])   . Dialysis patient ([place])   . Diplopia   . Dupuytren's contracture of foot   . Essential (primary) hypertension 02/12/2015  . Family history of thyroid problem   . GERD (gastroesophageal reflux disease)   . Gout   . Heart disease    enlarged because of COPD  . Heart murmur   . Hepatomegaly   . HLD (hyperlipidemia)   . Hyperactive airway disease   . Hyperkalemia   .  Hypertension   . Hyperthyroidism   . Irritable colon   . Morbid obesity ([place])   . Multiple thyroid nodules   . OAB (overactive bladder)   . Pernicious anemia   . Post menopausal syndrome   . Post-concussion syndrome 03/08/2014  . Post-traumatic brain syndrome   . Prolapsed uterus   . PSVT (paroxysmal supraventricular tachycardia) ([place])   . Renal calculus   . Renal disease    w/ GFR 29-may be due to diabetes  . Seizures ([place])    last seizure 2012. has had spells since with no knowledge. last was 1 month ago  . Sleep apnea   . 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 Transient alteration of awareness 11/10/2012  . Urinary incontinence with continuous leakage 03/08/2014    Patient Active Problem List   Diagnosis Date Noted  . Anemia due to pre-ESRD treated with erythropoietin 09/23/2017  . Hemodialysis access, AV graft ([place]) 09/23/2017  . ESRD on dialysis ([place]) 02/06/2017  . Delay kidney tx func d/t fluid overload requiring acute dialysis ([place]) 12/03/2016  . Menopause 10/22/2016  . Status post endometrial ablation 10/22/2016  . Swelling of limb 04/18/2016  . Kidney dialysis as the cause of abnormal reaction of the patient, or of later complication, without mention of misadventure at the  time of the procedure (CODE) 04/18/2016  . End-stage renal disease on hemodialysis ([place]) 08/24/2015  . OAB (overactive bladder) 06/07/2015  . Incontinence 06/07/2015  . Airway hyperreactivity 06/06/2015  . Chronic kidney disease, stage V ([place]) 06/06/2015  . Binocular vision disorder with diplopia 06/06/2015  . Type 2 diabetes mellitus ([place]) 06/01/2015  . Severe diabetic hypoglycemia ([place]) 04/16/2015  . Convulsions ([place]) 02/12/2015  . Hypoglycemic reaction 02/12/2015  . Chronic kidney disease (CKD), stage IV (severe) ([place]) 02/12/2015  . Other symptoms and signs concerning food and fluid intake 02/12/2015  . Absence of menstruation 02/12/2015  . Absolute  anemia 02/12/2015  . Post menopausal syndrome 02/12/2015  . Calculus of kidney 02/12/2015  . Chronic obstructive pulmonary disease ([place]) 02/12/2015  . Bone/cartilage disorder 02/12/2015  . Urinary system disease 02/12/2015  . Encounter for gynecological examination without abnormal finding 02/12/2015  . 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 Essential (primary) hypertension 02/12/2015  . Gout 02/12/2015  . Hernia, internal 02/12/2015  . Hypoglycemia 02/12/2015  . Adaptive colitis 02/12/2015  . Arthritis, degenerative 02/12/2015  . Postprocedural state 02/12/2015  . Dupuytren's contracture of foot 02/12/2015  . Cutaneous eruption 02/12/2015  . 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 Absence of bladder continence 02/12/2015  . Cervical prolapse 02/12/2015  . Seizure ([place]) 02/12/2015  . Benign essential HTN 12/26/2014  . Carotid artery narrowing 12/22/2014  . Chest pain 12/22/2014  . Combined fat and carbohydrate induced hyperlipemia 12/22/2014  . Acute bacterial sinusitis 04/19/2014  . Anemia in chronic illness 03/16/2014  . Urinary incontinence with continuous leakage 03/08/2014  . Post-concussion syndrome 03/08/2014  . Total urinary incontinence 03/08/2014  . 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 Apnea, sleep 01/17/2014  . Patient awaiting renal transplant 06/03/2013  . Bipolar affective disorder ([place]) 12/16/2012  . Acid reflux 12/16/2012  . Class 2 severe obesity due to excess calories with serious comorbidity and body mass index (BMI) of 37.0 to 37.9 in adult ([place]) 12/16/2012  . Obstructive apnea 12/16/2012  . Addison anemia 12/16/2012  . Seizure disorder ([place]) 12/16/2012  . Ataxia 11/10/2012  . 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 Transient alteration of awareness 11/10/2012  . Severe obesity (BMI >= 40) ([place]) 11/10/2012  . Morbid obesity ([place]) 11/10/2012  . Speech disorder 11/10/2012  . Encounter for general adult medical  examination without abnormal findings 09/08/2012  . Multinodular goiter 01/29/2012  . Cardiac murmur 10/29/2011  . High potassium 10/28/2011  . Adult hypothyroidism 03/12/2011    Past Surgical History:  Procedure Laterality Date  . A/V FISTULAGRAM Left 07/21/2016   Procedure: A/V Fistulagram;  Surgeon: Algernon Huxley, MD;  Location: [place] [facility];  Service: Cardiovascular;  Laterality: Left;  . A/V FISTULAGRAM Left 12/24/2016   Procedure: A/V Fistulagram;  Surgeon: Algernon Huxley, MD;  Location: [place] [facility];  Service: Cardiovascular;  Laterality: Left;  . A/V FISTULAGRAM Left 06/29/2017   Procedure: A/V FISTULAGRAM;  Surgeon: Algernon Huxley, MD;  Location: [place] [facility];  Service: Cardiovascular;  Laterality: Left;  . A/V SHUNT INTERVENTION N/A 07/21/2016   Procedure: A/V Shunt Intervention;  Surgeon: Algernon Huxley, MD;  Location: [place] [facility];  Service: Cardiovascular;  Laterality: N/A;  . A/V SHUNT INTERVENTION N/A 12/24/2016   Procedure: A/V SHUNT INTERVENTION;  Surgeon: Algernon Huxley, MD;  Location: [place] [facility];  Service: Cardiovascular;  Laterality: N/A;  . AVF    . CHOLECYSTECTOMY    .  COLONOSCOPY    . COLONOSCOPY WITH PROPOFOL N/A 03/18/2017   Procedure: COLONOSCOPY WITH PROPOFOL;  Surgeon: Manya Silvas, MD;  Location: [facility] ENDOSCOPY;  Service: Endoscopy;  Laterality: N/A;  . ENDOMETRIAL BIOPSY     ablation, uterine  . ESOPHAGOGASTRODUODENOSCOPY (EGD) WITH PROPOFOL N/A 03/18/2017   Procedure: ESOPHAGOGASTRODUODENOSCOPY (EGD) WITH PROPOFOL;  Surgeon: Manya Silvas, MD;  Location: [facility]COPY;  Service: Endoscopy;  Laterality: N/A;  . HERNIA REPAIR    . HERNIA REPAIR    . PERIPHERAL VASCULAR CATHETERIZATION N/A 08/23/2015   Procedure: Dialysis/Perma Catheter Insertion;  Surgeon: Algernon Huxley, MD;  Location: [place] CV LAB;  Service: Cardiovascular;  Laterality: N/A;  . PERIPHERAL VASCULAR CATHETERIZATION N/A 11/09/2015   Procedure:  Dialysis/Perma Catheter Removal;  Surgeon: Katha Cabal, MD;  Location: [place] [facility];  Service: Cardiovascular;  Laterality: N/A;  . PERIPHERAL VASCULAR CATHETERIZATION Left 04/23/2016   Procedure: A/V Fistulagram;  Surgeon: Algernon Huxley, MD;  Location: [place] [facility] LAB;  Service: Cardiovascular;  Laterality: Left;  . PORT A CATH REVISION      Prior to Admission medications   Medication Sig Start Date End Date Taking? Authorizing Provider  amLODipine (NORVASC) 5 MG tablet Take 5 mg by mouth See admin instructions. Take 5 mg by mouth daily except do not take on Tuesday, Thursday and Saturday. 11/26/16 11/26/17  [provider]  aspirin 81 MG tablet Take 81 mg by mouth daily.     [provider]  b complex vitamins tablet Take 1 tablet by mouth at bedtime.     [provider]  cephALEXin (KEFLEX) 250 MG capsule Take 1 capsule (250 mg total) by mouth 2 (two) times daily for 10 days. 10/25/17 11/04/17  Arta Silence, MD  clonazePAM (KLONOPIN) 1 MG tablet Take 1 tablet (1 mg total) by mouth at bedtime. 06/08/17   Dohmeier, Asencion Partridge, MD  cyanocobalamin (,VITAMIN B-12,) 1000 MCG/ML injection Inject 1,000 mcg into the muscle every 14 (fourteen) days.     [provider]  Docusate Calcium (STOOL SOFTENER PO) Take 1 capsule by mouth every morning.     [provider]  epoetin alfa (EPOGEN,PROCRIT) 3000 UNIT/ML injection Inject 3,000 Units into the vein every Monday, Wednesday, and Friday.    [provider]  ethosuximide (ZARONTIN) 250 MG/5ML solution TAKE 5ML BY MOUTH 2 TIMES DAILY Patient taking differently: Take 250 mg by mouth 2 (two) times daily.  06/08/17   Dohmeier, Asencion Partridge, MD  famotidine (PEPCID) 40 MG tablet Take by mouth. 09/08/17 09/08/18  [provider]  Febuxostat (ULORIC) 80 MG TABS Take 80 mg by mouth daily.     [provider]  ferric citrate (AURYXIA) 1 GM 210 MG(Fe) tablet Take 2 tablets by mouth 3 (three)  times daily.    [provider]  fexofenadine (ALLEGRA) 180 MG tablet Take 180 mg by mouth daily.     [provider]  fluconazole (DIFLUCAN) 150 MG tablet Take 1 tablet (150 mg total) by mouth daily. 06/30/17   Defrancesco, Alanda Slim, MD  Fluticasone-Salmeterol (ADVAIR DISKUS) 250-50 MCG/DOSE AEPB Inhale 1 puff into the lungs 2 (two) times daily.    [provider]  folic acid-vitamin b complex-vitamin c-selenium-zinc (DIALYVITE) 3 MG TABS tablet Take 1 tablet by mouth daily.    [provider]  furosemide (LASIX) 20 MG tablet Take 30 mg by mouth 2 (two) times daily.     [provider]  heparin 1000 unit/ml SOLN injection Inject  into the peritoneum as needed.    [provider]  hydrALAZINE (APRESOLINE) 100 MG tablet Take 100 mg by mouth See admin instructions. Take 100 mg by mouth twice daily on non-dialysis days. Take 100 mg by mouth at bedtime on dialysis days Tuesday, Thursday and Saturday    [provider]  insulin glargine (LANTUS) 100 UNIT/ML injection Inject 22 Units into the skin at bedtime.     [provider]  insulin lispro (HUMALOG) 100 UNIT/ML injection Inject 10-15 Units into the skin 3 (three) times daily with meals. Per sliding scale    [provider]  LAMICTAL 200 MG tablet Take 1.5 tablets (300 mg total) by mouth 2 (two) times daily. 10/01/17   Dohmeier, Asencion Partridge, MD  levothyroxine (SYNTHROID) 150 MCG tablet Take 150 mcg by mouth daily before breakfast.     [provider]  metoprolol succinate (TOPROL XL) 50 MG 24 hr tablet Take 1 tablet (50 mg total) by mouth 2 (two) times daily. Take with or immediately following a meal. Patient taking differently: Take 75 mg by mouth See admin instructions. Take 75 mg by mouth twice daily on non dialysis days. Take 75 mg by mouth at bedtime on dialysis days Tuesday, Thursday and Saturday. 04/16/16   Dohmeier, Asencion Partridge, MD  montelukast (SINGULAIR) 10 MG tablet  Take 10 mg by mouth at bedtime.    [provider]  nystatin-triamcinolone (MYCOLOG II) cream Apply 1 application topically 2 (two) times daily. Patient taking differently: Apply 1 application topically daily as needed (rash).  10/22/16   Defrancesco, Alanda Slim, MD  pantoprazole (PROTONIX) 40 MG tablet Take 40 mg by mouth every morning.     [provider]  Probiotic Product (ALIGN) 4 MG CAPS Take 4 mg by mouth daily.     [provider]  TEGRETOL-XR 100 MG 12 hr tablet Take 1 tablet bid for 2 weeks and than 1 in Pm for the rest of the bottle. 09/23/17   Dohmeier, Asencion Partridge, MD  triamcinolone (NASACORT ALLERGY 24HR) 55 MCG/ACT AERO nasal inhaler Place 2 sprays into the nose at bedtime.     [provider]    Allergies Cinnamon; Procrit [epoetin alfa]; Ace inhibitors; Azithromycin; Iron; Neomycin-bacitracin zn-polymyx; and Sulfa antibiotics  Family History  Problem Relation Age of Onset  . Diabetes Mother   . Lumbar disc disease Mother   . Migraines Mother   . Hypertension Mother   . Cancer - Prostate Father        mild  . Diabetes Father   . Hypertension Father   . Breast cancer Neg Hx   . Ovarian cancer Neg Hx   . Colon cancer Neg Hx   . Heart disease Neg Hx   . Kidney cancer Neg Hx   . Bladder Cancer Neg Hx     Social History Social History   Tobacco Use  . Smoking status: Never Smoker  . Smokeless tobacco: Never Used  Substance Use Topics  . Alcohol use: No  . Drug use: No    Review of Systems  Constitutional: No fever. Eyes: No visual changes. ENT: No sore throat. Cardiovascular: Denies chest pain. Respiratory: Denies shortness of breath.  Positive for cough. Gastrointestinal: No nausea or vomiting.  Genitourinary: Negative for dysuria.  Musculoskeletal: Negative for back pain. Skin: Negative for rash. Neurological: Negative for headache.   ____________________________________________   PHYSICAL EXAM:  VITAL SIGNS: ED  Triage Vitals  Enc Vitals Group     BP 10/24/17 2149 138/67  Pulse Rate 10/24/17 2149 86     Resp 10/24/17 2149 18     Temp 10/24/17 2149 99.5 F (37.5 C)     Temp Source 10/24/17 2149 Oral     SpO2 10/24/17 2149 97 %     Weight 10/24/17 2150 218 lb (98.9 kg)     Height 10/24/17 2150 5' 2.5" (1.588 m)     Head Circumference --      Peak Flow --      Pain Score 10/24/17 2149 6     Pain Loc --      Pain Edu? --      Excl. in [place]? --     Constitutional: Alert and oriented. Well appearing and in no acute distress. Eyes: Conjunctivae are normal.  Head: Atraumatic. Nose: No congestion/rhinnorhea. Mouth/Throat: Mucous membranes are moist.   Neck: Normal range of motion.  Cardiovascular: Normal rate, regular rhythm. Grossly normal heart sounds.  Good peripheral circulation. Respiratory: Normal respiratory effort.  No retractions.  Slightly decreased breath sounds bilaterally but lungs CTAB. Gastrointestinal: Soft and nontender. No distention.  Genitourinary: No flank tenderness. Musculoskeletal: Extremities warm and well perfused.  Neurologic:  Normal speech and language. No gross focal neurologic deficits are appreciated.  Skin:  Skin is warm and dry. No rash noted. Psychiatric: Mood and affect are normal. Speech and behavior are normal.  ____________________________________________   LABS (all labs ordered are listed, but only abnormal results are displayed)  Labs Reviewed  GLUCOSE, CAPILLARY - Abnormal; Notable for the following components:      Result Value   Glucose-Capillary 225 (*)    All other components within normal limits  BASIC METABOLIC PANEL - Abnormal; Notable for the following components:   Chloride 99 (*)    Glucose, Bld 217 (*)    Creatinine, Ser 2.96 (*)    GFR calc non Af Amer 17 (*)    GFR calc Af Amer 19 (*)    All other components within normal limits  CBC - Abnormal; Notable for the following components:   WBC 13.0 (*)    RBC 3.22 (*)     Hemoglobin 10.7 (*)    HCT 32.1 (*)    RDW 15.5 (*)    All other components within normal limits  URINALYSIS, COMPLETE (UACMP) WITH MICROSCOPIC - Abnormal; Notable for the following components:   Color, Urine AMBER (*)    APPearance CLOUDY (*)    Glucose, UA 150 (*)    Protein, ur 100 (*)    Leukocytes, UA MODERATE (*)    WBC, UA >50 (*)    Bacteria, UA RARE (*)    All other components within normal limits  GLUCOSE, CAPILLARY - Abnormal; Notable for the following components:   Glucose-Capillary 172 (*)    All other components within normal limits  CBG MONITORING, ED   ____________________________________________  EKG   ____________________________________________  RADIOLOGY    ____________________________________________   PROCEDURES  Procedure(s) performed: No  Procedures  Critical Care performed: No ____________________________________________   INITIAL IMPRESSION / ASSESSMENT AND PLAN / ED COURSE  Pertinent labs & imaging results that were available during my care of the patient were reviewed by me and considered in my medical decision making (see chart for details).  57 year old female with PMH as noted above presents with hyperglycemia to the 500s today.  Patient took additional short acting insulin but states she checked her glucose again it was still elevated.  The patient denies any other acute symptoms in the last  few days, although she has had some cough in the last few weeks for which she is taking an antibiotic.  On exam, the patient is well-appearing, vital signs are normal except for borderline elevated temperature, and the remainder of the exam is as described above.  Initial labs obtained from triage show glucose in the 200s, and slightly elevated WBC count but are otherwise reassuring.  There is no clinical or lab evidence for DKA.  Given the patient's borderline temperature and elevated WBC, there is the possibility of infection as a precipitating  cause for patient's hyperglycemia.  Patient reports that her respiratory symptoms are improving and her lung exam is unremarkable so I do not suspect pneumonia.  We will obtain a UA to rule out UTI, as patient still makes urine.  Anticipate discharge home if UA is negative and repeat glucose is normal.  ----------------------------------------- 12:53 AM on 10/25/2017 -----------------------------------------  The patient's UA is consistent with a UTI.  Although the patient has been on Cefdinir for her cough she has only 1 dose left and this would likely not be appropriate coverage for UTI.  I will instead prescribe cephalexin.  The patient's creatinine clearance is 26, so I will give a half dose of 250 mg every 12 hours per dosing recommendations for renal impairment.  I discussed the results of the work-up and the plan of care with the patient and her family members, and they expressed agreement.  Return precautions given, and they voiced understanding.  ____________________________________________   FINAL CLINICAL IMPRESSION(S) / ED DIAGNOSES  Final diagnoses:  Hyperglycemia  Urinary tract infection without hematuria, site unspecified      NEW MEDICATIONS STARTED DURING THIS VISIT:  New Prescriptions   CEPHALEXIN (KEFLEX) 250 MG CAPSULE    Take 1 capsule (250 mg total) by mouth 2 (two) times daily for 10 days.     Note:  This document was prepared using Dragon voice recognition software and may include unintentional dictation errors.    Arta Silence, MD 10/25/17 [phone]

## 2017-10-24 NOTE — ED Triage Notes (Addendum)
Pt says she had scheduled dialysis today; pt says tonight before dinner her blood sugar was 511; recheck after was 599; given insulin; before arrival blood sugar was 525; pt also reports headache that has been intermittent for the last 10-11 days; pt awake and alert, talking in complete coherent sentences; pt adds an intermittent cough, productive of thick mucus, for about 4 months; has been taking antibiotic for 3 weeks for cough;

## 2017-10-25 DIAGNOSIS — E1165 Type 2 diabetes mellitus with hyperglycemia: Secondary | ICD-10-CM | POA: Diagnosis not present

## 2017-10-25 LAB — URINALYSIS, COMPLETE (UACMP) WITH MICROSCOPIC
Bilirubin Urine: NEGATIVE
Glucose, UA: 150 mg/dL — AB
Hgb urine dipstick: NEGATIVE
Ketones, ur: NEGATIVE mg/dL
Nitrite: NEGATIVE
Protein, ur: 100 mg/dL — AB
Specific Gravity, Urine: 1.014 (ref 1.005–1.030)
WBC, UA: >50 WBC/hpf — ABNORMAL HIGH (ref 0–5)
pH: 7 (ref 5.0–8.0)

## 2017-10-25 MED ORDER — CEPHALEXIN 250 MG PO CAPS: 250.0000 mg | ORAL_CAPSULE | Freq: Two times a day (BID) | ORAL | 0 refills | Status: AC

## 2017-10-25 MED ORDER — CEPHALEXIN 250 MG PO CAPS
250.0000 mg | ORAL_CAPSULE | Freq: Once | ORAL | Status: AC
  Administered 2017-10-25: 250 mg via ORAL
  Filled 2017-10-25: qty 1

## 2017-10-25 NOTE — Discharge Instructions (Signed)
Take your Lantus tonight as usual.  Take the antibiotic starting tomorrow morning and finish the full course.  Return to the ER for new, worsening, or persistent hyperglycemia, weakness, fevers, abdominal pain, or any other new or worsening symptoms that concern you.

## 2017-10-28 ENCOUNTER — Encounter: Payer: Medicare Other | Admitting: Obstetrics and Gynecology

## 2017-11-02 NOTE — Progress Notes (Signed)
ANNUAL PREVENTATIVE CARE GYN  ENCOUNTER NOTE  Subjective:       Nina Johnston is a 57 y.o. No obstetric history on file. female here for a routine annual gynecologic exam.  Current complaints:  1. Vaginal burning x 1 month associated with urinating.  Nina Johnston is now on dialysis 3 days a week. Dialysis is being tolerated Bowel and bladder function are normal. She relates no vaginal discharge or pelvic pain. She reports no significant flank pain.  Gynecologic History No LMP recorded. Patient is postmenopausal. Contraception: post menopausal status Last Pap: 10/2015 reflex neg Last mammogram: 04/2017  birad 1. Results were: normal Status post endometrial ablation  Obstetric History OB History  Gravida Para Term Preterm AB Living  0 0 0 0 0 0  SAB TAB Ectopic Multiple Live Births  0 0 0 0 0    Past Medical History:  Diagnosis Date  . A-V fistula ([place])   . Acute bacterial sinusitis 04/19/2014  . Adaptive colitis   . Anginal pain ([place])   . Anxiety   . Anxiety and depression   . Arthritis   . Asthma   . 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 Benign essential HTN 12/26/2014  . Bipolar disorder ([place])   . Bronchitis   . Carotid artery narrowing 12/22/2014  . Cervical prolapse   . CHF (congestive heart failure) ([place])   . CKD (chronic kidney disease)   . Concussion 01-14-14   Fall   . COPD (chronic obstructive pulmonary disease) ([place])   . Coronary artery disease   . Depression   . Diabetes mellitus without complication ([place])   . Dialysis patient ([place])   . Diplopia   . Dupuytren's contracture of foot   . Essential (primary) hypertension 02/12/2015  . Family history of thyroid problem   . GERD (gastroesophageal reflux disease)   . Gout   . Heart disease    enlarged because of COPD  . Heart murmur   . Hepatomegaly   . HLD (hyperlipidemia)   . Hyperactive airway disease   . Hyperkalemia   .  Hypertension   . Hyperthyroidism   . Irritable colon   . Morbid obesity ([place])   . Multiple thyroid nodules   . OAB (overactive bladder)   . Pernicious anemia   . Post menopausal syndrome   . Post-concussion syndrome 03/08/2014  . Post-traumatic brain syndrome   . Prolapsed uterus   . PSVT (paroxysmal supraventricular tachycardia) ([place])   . Renal calculus   . Renal disease    w/ GFR 29-may be due to diabetes  . Seizures ([place])    last seizure 2012. has had spells since with no knowledge. last was 1 month ago  . Sleep apnea   . 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 Transient alteration of awareness 11/10/2012  . Urinary incontinence with continuous leakage 03/08/2014    Past Surgical History:  Procedure Laterality Date  . A/V FISTULAGRAM Left 07/21/2016   Procedure: A/V Fistulagram;  Surgeon: Algernon Huxley, MD;  Location: [facility];  Service: Cardiovascular;  Laterality: Left;  . A/V FISTULAGRAM Left 12/24/2016   Procedure: A/V Fistulagram;  Surgeon: Algernon Huxley, MD;  Location: [facility];  Service: Cardiovascular;  Laterality: Left;  . A/V FISTULAGRAM Left 06/29/2017   Procedure: A/V FISTULAGRAM;  Surgeon: Algernon Huxley, MD;  Location: [facility];  Service: Cardiovascular;  Laterality: Left;  . A/V SHUNT INTERVENTION N/A 07/21/2016   Procedure: A/V Shunt Intervention;  Surgeon: Algernon Huxley, MD;  Location: [facility];  Service: Cardiovascular;  Laterality: N/A;  . A/V SHUNT INTERVENTION N/A 12/24/2016   Procedure: A/V SHUNT INTERVENTION;  Surgeon: Algernon Huxley, MD;  Location: [facility];  Service: Cardiovascular;  Laterality: N/A;  . AVF    . CHOLECYSTECTOMY    . COLONOSCOPY    . COLONOSCOPY WITH PROPOFOL N/A 03/18/2017   Procedure: COLONOSCOPY WITH PROPOFOL;  Surgeon: Manya Silvas, MD;  Location: [facility] ENDOSCOPY;  Service: Endoscopy;  Laterality: N/A;  . ENDOMETRIAL BIOPSY     ablation,  uterine  . ESOPHAGOGASTRODUODENOSCOPY (EGD) WITH PROPOFOL N/A 03/18/2017   Procedure: ESOPHAGOGASTRODUODENOSCOPY (EGD) WITH PROPOFOL;  Surgeon: Manya Silvas, MD;  Location: [facility] ENDOSCOPY;  Service: Endoscopy;  Laterality: N/A;  . HERNIA REPAIR    . HERNIA REPAIR    . PERIPHERAL VASCULAR CATHETERIZATION N/A 08/23/2015   Procedure: Dialysis/Perma Catheter Insertion;  Surgeon: Algernon Huxley, MD;  Location: [facility];  Service: Cardiovascular;  Laterality: N/A;  . PERIPHERAL VASCULAR CATHETERIZATION N/A 11/09/2015   Procedure: Dialysis/Perma Catheter Removal;  Surgeon: Katha Cabal, MD;  Location: [facility];  Service: Cardiovascular;  Laterality: N/A;  . PERIPHERAL VASCULAR CATHETERIZATION Left 04/23/2016   Procedure: A/V Fistulagram;  Surgeon: Algernon Huxley, MD;  Location: [facility];  Service: Cardiovascular;  Laterality: Left;  . PORT A CATH REVISION      Current Outpatient Medications on File Prior to Visit  Medication Sig Dispense Refill  . amLODipine (NORVASC) 5 MG tablet Take 5 mg by mouth See admin instructions. Take 5 mg by mouth daily except do not take on Tuesday, Thursday and Saturday.    Marland Kitchen aspirin 81 MG tablet Take 81 mg by mouth daily.     Marland Kitchen b complex vitamins tablet Take 1 tablet by mouth at bedtime.     . cephALEXin (KEFLEX) 250 MG capsule Take 1 capsule (250 mg total) by mouth 2 (two) times daily for 10 days. 20 capsule 0  . clonazePAM (KLONOPIN) 1 MG tablet Take 1 tablet (1 mg total) by mouth at bedtime. 30 tablet 5  . cyanocobalamin (,VITAMIN B-12,) 1000 MCG/ML injection Inject 1,000 mcg into the muscle every 14 (fourteen) days.     Nina Johnston Calcium (STOOL SOFTENER PO) Take 1 capsule by mouth every morning.     Marland Kitchen epoetin alfa (EPOGEN,PROCRIT) 3000 UNIT/ML injection Inject 3,000 Units into the vein every Monday, Wednesday, and Friday.    . ethosuximide (ZARONTIN) 250 MG/5ML solution TAKE 5ML BY MOUTH 2 TIMES DAILY (Patient taking  differently: Take 250 mg by mouth 2 (two) times daily. ) 474 mL 3  . famotidine (PEPCID) 40 MG tablet Take by mouth.    . Febuxostat (ULORIC) 80 MG TABS Take 80 mg by mouth daily.     . ferric citrate (AURYXIA) 1 GM 210 MG(Fe) tablet Take 2 tablets by mouth 3 (three) times daily.    . fexofenadine (ALLEGRA) 180 MG tablet Take 180 mg by mouth daily.     . fluconazole (DIFLUCAN) 150 MG tablet Take 1 tablet (150 mg total) by mouth daily. 1 tablet 0  . Fluticasone-Salmeterol (ADVAIR DISKUS) 250-50 MCG/DOSE AEPB Inhale 1 puff into the lungs 2 (two) times daily.    . folic acid-vitamin b complex-vitamin c-selenium-zinc (DIALYVITE) 3 MG TABS  tablet Take 1 tablet by mouth daily.    . furosemide (LASIX) 20 MG tablet Take 30 mg by mouth 2 (two) times daily.     . heparin 1000 unit/ml SOLN injection Inject into the peritoneum as needed.    . hydrALAZINE (APRESOLINE) 100 MG tablet Take 100 mg by mouth See admin instructions. Take 100 mg by mouth twice daily on non-dialysis days. Take 100 mg by mouth at bedtime on dialysis days Tuesday, Thursday and Saturday    . insulin glargine (LANTUS) 100 UNIT/ML injection Inject 22 Units into the skin at bedtime.     . insulin lispro (HUMALOG) 100 UNIT/ML injection Inject 10-15 Units into the skin 3 (three) times daily with meals. Per sliding scale    . LAMICTAL 200 MG tablet Take 1.5 tablets (300 mg total) by mouth 2 (two) times daily. 90 tablet 5  . levothyroxine (SYNTHROID) 150 MCG tablet Take 150 mcg by mouth daily before breakfast.     . metoprolol succinate (TOPROL XL) 50 MG 24 hr tablet Take 1 tablet (50 mg total) by mouth 2 (two) times daily. Take with or immediately following a meal. (Patient taking differently: Take 75 mg by mouth See admin instructions. Take 75 mg by mouth twice daily on non dialysis days. Take 75 mg by mouth at bedtime on dialysis days Tuesday, Thursday and Saturday.) 60 tablet 11  . montelukast (SINGULAIR) 10 MG tablet Take 10 mg by mouth at  bedtime.    Marland Kitchen nystatin-triamcinolone (MYCOLOG II) cream Apply 1 application topically 2 (two) times daily. (Patient taking differently: Apply 1 application topically daily as needed (rash). ) 30 g 1  . pantoprazole (PROTONIX) 40 MG tablet Take 40 mg by mouth every morning.     . Probiotic Product (ALIGN) 4 MG CAPS Take 4 mg by mouth daily.     . TEGRETOL-XR 100 MG 12 hr tablet Take 1 tablet bid for 2 weeks and than 1 in Pm for the rest of the bottle. 180 tablet 2  . triamcinolone (NASACORT ALLERGY 24HR) 55 MCG/ACT AERO nasal inhaler Place 2 sprays into the nose at bedtime.      No current facility-administered medications on file prior to visit.     Allergies  Allergen Reactions  . Cinnamon Other (See Comments)    Raises blood pressure  . Procrit [Epoetin Alfa] Other (See Comments)    Seizures, grand mal  . Ace Inhibitors Cough  . Azithromycin Other (See Comments)    seizures  . Iron Other (See Comments)    Can not take in IV Form - causes seizures   . Neomycin-Bacitracin Zn-Polymyx Other (See Comments)    Unknown  . Sulfa Antibiotics Other (See Comments)    Due to risks for seizures.    Social History   Socioeconomic History  . Marital status: Single    Spouse name: Not on file  . Number of children: 0  . Years of education:  college  . Highest education level: Not on file  Occupational History  . 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 Financial resource strain: Not on file  . Food insecurity:    Worry: Not on file    Inability: Not on file  . Transportation needs:    Medical: Not on file    Non-medical: Not on file  Tobacco Use  . Smoking status: Never Smoker  . Smokeless tobacco: Never Used  Substance and Sexual Activity  . Alcohol use: No  . Drug use: No  .  Sexual activity: Not Currently  Lifestyle  . Physical activity:    Days per week: Not on file    Minutes per session: Not on file  . Stress: Not on file  Relationships  . Social connections:     Talks on phone: Not on file    Gets together: Not on file    Attends religious service: Not on file    Active member of club or organization: Not on file    Attends meetings of clubs or organizations: Not on file    Relationship status: Not on file  . Intimate partner violence:    Fear of current or ex partner: Not on file    Emotionally abused: Not on file    Physically abused: Not on file    Forced sexual activity: Not on file  Other Topics Concern  . Not on file  Social History Narrative   Patient is single and lives with her parents.   Patient is disabled.   Patient has a college education.   Patient is right- handed.   Patient drinks tea occasionally. 2 glasses of tea when they eat out.    Family History  Problem Relation Age of Onset  . Diabetes Mother   . Lumbar disc disease Mother   . Migraines Mother   . Hypertension Mother   . Cancer - Prostate Father        mild  . Diabetes Father   . Hypertension Father   . Breast cancer Neg Hx   . Ovarian cancer Neg Hx   . Colon cancer Neg Hx   . Heart disease Neg Hx   . Kidney cancer Neg Hx   . Bladder Cancer Neg Hx     The following portions of the patient's history were reviewed and updated as appropriate: allergies, current medications, past family history, past medical history, past social history, past surgical history and problem list.  Review of Systems Review of Systems  Constitutional: Negative for chills, diaphoresis, fever, malaise/fatigue and weight loss.  HENT: Negative.   Eyes: Negative.   Respiratory: Negative.   Cardiovascular: Negative.   Gastrointestinal: Negative for abdominal pain, blood in stool, constipation and diarrhea.  Genitourinary: Positive for dysuria. Negative for flank pain, frequency, hematuria and urgency.  Musculoskeletal: Negative.   Skin: Negative.   Neurological: Negative.   Endo/Heme/Allergies: Negative.   Psychiatric/Behavioral: Negative.       Objective:   BP (!) 154/75    Pulse 73   Ht 5' 2.5" (1.588 m)   Wt 214 lb (97.1 kg)   BMI 38.52 kg/m  CONSTITUTIONAL: Well-developed, well-nourished female in no acute distress.  PSYCHIATRIC: Normal mood and affect. Normal behavior. Normal judgment and thought content. [place]: Alert and oriented to person, place, and time. Normal muscle tone coordination. No cranial nerve deficit noted. HENT:  Normocephalic, atraumatic EYES: Conjunctivae and EOM are normal. No scleral icterus.  NECK: Normal range of motion, supple, no masses.  Normal thyroid. SKIN: Skin is warm and dry. No rash noted. Not diaphoretic. No erythema. No pallor. CARDIOVASCULAR: Normal heart rate noted, regular rhythm, no murmur. RESPIRATORY: Clear to auscultation bilaterally. Effort and breath sounds normal, no problems with respiration noted. BREASTS: Symmetric in size. No masses, skin changes, nipple drainage, or lymphadenopathy. ABDOMEN: Soft, normal bowel sounds, no distention noted.  No tenderness, rebound or guarding. Supraumbilical hernia repair incision is well approximated;Previously noted firmness of her graft is resolved, nontender BLADDER: Normal PELVIC:  External Genitalia: Normal  BUS: Normal  Vagina:  Normal; Good support  Cervix: Normal; No motion tenderness; cervix comes to within 3-4 cm introitus; no lesions  Uterus: Normal; Midplane, normal size and shape, mobile, nontender  Adnexa: Normal; Nonpalpable, nontender  RV: External Exam NormaI, No Rectal Masses and Normal Sphincter tone  MUSCULOSKELETAL: Normal range of motion. No tenderness.  No cyanosis, clubbing, or edema.  2+ distal pulses. LYMPHATIC: No Axillary, Supraclavicular, or Inguinal Adenopathy.    Assessment:   Annual gynecologic examination 57 y.o. Contraception: post menopausal status Obesity 2 Status post endometrial ablation, asymptomatic History of renal failure; currently on dialysis 3 times a week History of multiple comorbidities including hypertension,  COPD, diabetes mellitus, GERD Status post umbilical hernia repair with mesh, asymptomatic  Plan Pap- not needed Mammogram: Ordered Stool Guaiac Testing:  Ordered Labs: primary care Routine preventative health maintenance measures emphasized: Exercise/Diet/Weight control, Tobacco Warnings and Alcohol/Substance use risks Return to [place], CMA  Brayton Mars, MD   Note: This dictation was prepared with Dragon dictation along with smaller phrase technology. Any transcriptional errors that result from this process are unintentional.

## 2017-11-04 ENCOUNTER — Ambulatory Visit (INDEPENDENT_AMBULATORY_CARE_PROVIDER_SITE_OTHER): Payer: Medicare Other | Admitting: Obstetrics and Gynecology

## 2017-11-04 ENCOUNTER — Encounter: Payer: Self-pay | Admitting: Obstetrics and Gynecology

## 2017-11-04 VITALS — BP 154/75 | HR 73 | Ht 62.5 in | Wt 214.0 lb

## 2017-11-04 DIAGNOSIS — R300 Dysuria: Secondary | ICD-10-CM | POA: Diagnosis not present

## 2017-11-04 DIAGNOSIS — Z1231 Encounter for screening mammogram for malignant neoplasm of breast: Secondary | ICD-10-CM | POA: Diagnosis not present

## 2017-11-04 DIAGNOSIS — Z1211 Encounter for screening for malignant neoplasm of colon: Secondary | ICD-10-CM | POA: Diagnosis not present

## 2017-11-04 DIAGNOSIS — Z9889 Other specified postprocedural states: Secondary | ICD-10-CM | POA: Diagnosis not present

## 2017-11-04 DIAGNOSIS — Z6837 Body mass index (BMI) 37.0-37.9, adult: Secondary | ICD-10-CM

## 2017-11-04 DIAGNOSIS — Z01419 Encounter for gynecological examination (general) (routine) without abnormal findings: Secondary | ICD-10-CM

## 2017-11-04 DIAGNOSIS — Z1239 Encounter for other screening for malignant neoplasm of breast: Secondary | ICD-10-CM

## 2017-11-04 DIAGNOSIS — Z780 Asymptomatic menopausal state: Secondary | ICD-10-CM

## 2017-11-04 LAB — POCT URINALYSIS DIPSTICK
Glucose, UA: NEGATIVE
Ketones, UA: NEGATIVE
Nitrite, UA: NEGATIVE
Odor: NEGATIVE
Protein, UA: POSITIVE — AB
Spec Grav, UA: <=1.005 — AB (ref 1.010–1.025)
Urobilinogen, UA: 0.2 E.U./dL
pH, UA: 7 (ref 5.0–8.0)

## 2017-11-04 NOTE — Patient Instructions (Addendum)
1.  Pap smear is not done.  No further Pap smears are needed 2.  Mammogram is ordered 3.  Screening labs are to be obtained through primary care 4.  Stool guaiac cards are given for colon cancer screening 5.  Continue with healthy eating and exercise 6.  Recommend calcium and vitamin D supplementation daily 7.  Return in 1 year for annual exam 8.  Urinalysis and urine culture are obtained today.   Health Maintenance for Postmenopausal Women Menopause is a normal process in which your reproductive ability comes to an end. This process happens gradually over a span of months to years, usually between the ages of 29 and 47. Menopause is complete when you have missed 12 consecutive menstrual periods. It is important to talk with your health care provider about some of the most common conditions that affect postmenopausal women, such as heart disease, cancer, and bone loss (osteoporosis). Adopting a healthy lifestyle and getting preventive care can help to promote your health and wellness. Those actions can also lower your chances of developing some of these common conditions. What should I know about menopause? During menopause, you may experience a number of symptoms, such as:  Moderate-to-severe hot flashes.  Night sweats.  Decrease in sex drive.  Mood swings.  Headaches.  Tiredness.  Irritability.  Memory problems.  Insomnia.  Choosing to treat or not to treat menopausal changes is an individual decision that you make with your health care provider. What should I know about hormone replacement therapy and supplements? Hormone therapy products are effective for treating symptoms that are associated with menopause, such as hot flashes and night sweats. Hormone replacement carries certain risks, especially as you become older. If you are thinking about using estrogen or estrogen with progestin treatments, discuss the benefits and risks with your health care provider. What should I  know about heart disease and stroke? Heart disease, heart attack, and stroke become more likely as you age. This may be due, in part, to the hormonal changes that your body experiences during menopause. These can affect how your body processes dietary fats, triglycerides, and cholesterol. Heart attack and stroke are both medical emergencies. There are many things that you can do to help prevent heart disease and stroke:  Have your blood pressure checked at least every 1-2 years. High blood pressure causes heart disease and increases the risk of stroke.  If you are 56-62 years old, ask your health care provider if you should take aspirin to prevent a heart attack or a stroke.  Do not use any tobacco products, including cigarettes, chewing tobacco, or electronic cigarettes. If you need help quitting, ask your health care provider.  It is important to eat a healthy diet and maintain a healthy weight. ? Be sure to include plenty of vegetables, fruits, low-fat dairy products, and lean protein. ? Avoid eating foods that are high in solid fats, added sugars, or salt (sodium).  Get regular exercise. This is one of the most important things that you can do for your health. ? Try to exercise for at least 150 minutes each week. The type of exercise that you do should increase your heart rate and make you sweat. This is known as moderate-intensity exercise. ? Try to do strengthening exercises at least twice each week. Do these in addition to the moderate-intensity exercise.  Know your numbers.Ask your health care provider to check your cholesterol and your blood glucose. Continue to have your blood tested as directed by your  health care provider.  What should I know about cancer screening? There are several types of cancer. Take the following steps to reduce your risk and to catch any cancer development as early as possible. Breast Cancer  Practice breast self-awareness. ? This means understanding how  your breasts normally appear and feel. ? It also means doing regular breast self-exams. Let your health care provider know about any changes, no matter how small.  If you are 55 or older, have a clinician do a breast exam (clinical breast exam or CBE) every year. Depending on your age, family history, and medical history, it may be recommended that you also have a yearly breast X-ray (mammogram).  If you have a family history of breast cancer, talk with your health care provider about genetic screening.  If you are at high risk for breast cancer, talk with your health care provider about having an MRI and a mammogram every year.  Breast cancer (BRCA) gene test is recommended for women who have family members with BRCA-related cancers. Results of the assessment will determine the need for genetic counseling and BRCA1 and for BRCA2 testing. BRCA-related cancers include these types: ? Breast. This occurs in males or females. ? Ovarian. ? Tubal. This may also be called fallopian tube cancer. ? Cancer of the abdominal or pelvic lining (peritoneal cancer). ? Prostate. ? Pancreatic.  Cervical, Uterine, and Ovarian Cancer Your health care provider may recommend that you be screened regularly for cancer of the pelvic organs. These include your ovaries, uterus, and vagina. This screening involves a pelvic exam, which includes checking for microscopic changes to the surface of your cervix (Pap test).  For women ages 21-65, health care providers may recommend a pelvic exam and a Pap test every three years. For women ages 34-65, they may recommend the Pap test and pelvic exam, combined with testing for human papilloma virus (HPV), every five years. Some types of HPV increase your risk of cervical cancer. Testing for HPV may also be done on women of any age who have unclear Pap test results.  Other health care providers may not recommend any screening for nonpregnant women who are considered low risk for  pelvic cancer and have no symptoms. Ask your health care provider if a screening pelvic exam is right for you.  If you have had past treatment for cervical cancer or a condition that could lead to cancer, you need Pap tests and screening for cancer for at least 20 years after your treatment. If Pap tests have been discontinued for you, your risk factors (such as having a new sexual partner) need to be reassessed to determine if you should start having screenings again. Some women have medical problems that increase the chance of getting cervical cancer. In these cases, your health care provider may recommend that you have screening and Pap tests more often.  If you have a family history of uterine cancer or ovarian cancer, talk with your health care provider about genetic screening.  If you have vaginal bleeding after reaching menopause, tell your health care provider.  There are currently no reliable tests available to screen for ovarian cancer.  Lung Cancer Lung cancer screening is recommended for adults 49-1 years old who are at high risk for lung cancer because of a history of smoking. A yearly low-dose CT scan of the lungs is recommended if you:  Currently smoke.  Have a history of at least 30 pack-years of smoking and you currently smoke or have  quit within the past 15 years. A pack-year is smoking an average of one pack of cigarettes per day for one year.  Yearly screening should:  Continue until it has been 15 years since you quit.  Stop if you develop a health problem that would prevent you from having lung cancer treatment.  Colorectal Cancer  This type of cancer can be detected and can often be prevented.  Routine colorectal cancer screening usually begins at age 61 and continues through age 56.  If you have risk factors for colon cancer, your health care provider may recommend that you be screened at an earlier age.  If you have a family history of colorectal cancer, talk  with your health care provider about genetic screening.  Your health care provider may also recommend using home test kits to check for hidden blood in your stool.  A small camera at the end of a tube can be used to examine your colon directly (sigmoidoscopy or colonoscopy). This is done to check for the earliest forms of colorectal cancer.  Direct examination of the colon should be repeated every 5-10 years until age 75. However, if early forms of precancerous polyps or small growths are found or if you have a family history or genetic risk for colorectal cancer, you may need to be screened more often.  Skin Cancer  Check your skin from head to toe regularly.  Monitor any moles. Be sure to tell your health care provider: ? About any new moles or changes in moles, especially if there is a change in a mole's shape or color. ? If you have a mole that is larger than the size of a pencil eraser.  If any of your family members has a history of skin cancer, especially at a young age, talk with your health care provider about genetic screening.  Always use sunscreen. Apply sunscreen liberally and repeatedly throughout the day.  Whenever you are outside, protect yourself by wearing long sleeves, pants, a wide-brimmed hat, and sunglasses.  What should I know about osteoporosis? Osteoporosis is a condition in which bone destruction happens more quickly than new bone creation. After menopause, you may be at an increased risk for osteoporosis. To help prevent osteoporosis or the bone fractures that can happen because of osteoporosis, the following is recommended:  If you are 32-38 years old, get at least 1,000 mg of calcium and at least 600 mg of vitamin D per day.  If you are older than age 47 but younger than age 70, get at least 1,200 mg of calcium and at least 600 mg of vitamin D per day.  If you are older than age 19, get at least 1,200 mg of calcium and at least 800 mg of vitamin D per  day.  Smoking and excessive alcohol intake increase the risk of osteoporosis. Eat foods that are rich in calcium and vitamin D, and do weight-bearing exercises several times each week as directed by your health care provider. What should I know about how menopause affects my mental health? Depression may occur at any age, but it is more common as you become older. Common symptoms of depression include:  Low or sad mood.  Changes in sleep patterns.  Changes in appetite or eating patterns.  Feeling an overall lack of motivation or enjoyment of activities that you previously enjoyed.  Frequent crying spells.  Talk with your health care provider if you think that you are experiencing depression. What should I know about  immunizations? It is important that you get and maintain your immunizations. These include:  Tetanus, diphtheria, and pertussis (Tdap) booster vaccine.  Influenza every year before the flu season begins.  Pneumonia vaccine.  Shingles vaccine.  Your health care provider may also recommend other immunizations. This information is not intended to replace advice given to you by your health care provider. Make sure you discuss any questions you have with your health care provider. Document Released: 06/13/2005 Document Revised: 11/09/2015 Document Reviewed: 01/23/2015 Elsevier Interactive Patient Education  2018 [employer].

## 2017-11-06 LAB — URINE CULTURE

## 2017-11-09 ENCOUNTER — Telehealth: Payer: Self-pay | Admitting: Neurology

## 2017-11-09 NOTE — Telephone Encounter (Signed)
Pts mother Donnajean Lopes) called stating the pts medication of LAMICTAL 200 MG tablet is needing a PA. Pts mother also stating that the pt has had a seizure event once a week since previous appt of 5/22 Please call to advise

## 2017-11-10 ENCOUNTER — Telehealth: Payer: Self-pay | Admitting: Neurology

## 2017-11-10 NOTE — Telephone Encounter (Signed)
PA approved 11/10/17- 11/11/2018 through cover my meds [employer] medicare.

## 2017-11-10 NOTE — Telephone Encounter (Signed)
Called the patient's mother back and made her aware that I have started the PA process for her medication and it can take up to 3 days before hearing back. I informed her that I will also make Dr Dohmeier aware of the seizure activity that she is having. The patient has only had 1/wk for the last 3 wks. Nothing has changed that mom is aware of. Informed her that if these were to worsen to go to ED and also call us back if anything worsens. Patient mom verbalized understanding and was appreciative for the call back.

## 2017-11-10 NOTE — Telephone Encounter (Signed)
PA submitted through cover my meds on the [facility] generic form.  KEY: [id number]  Can take up to 3 business days to hear a response.

## 2017-11-17 ENCOUNTER — Other Ambulatory Visit: Payer: Self-pay | Admitting: Neurology

## 2017-11-17 DIAGNOSIS — R404 Transient alteration of awareness: Secondary | ICD-10-CM

## 2017-11-17 DIAGNOSIS — R270 Ataxia, unspecified: Secondary | ICD-10-CM

## 2017-11-17 DIAGNOSIS — R4789 Other speech disturbances: Secondary | ICD-10-CM

## 2017-11-18 DIAGNOSIS — K2930 Chronic superficial gastritis without bleeding: Secondary | ICD-10-CM | POA: Insufficient documentation

## 2017-11-18 DIAGNOSIS — Z86010 Personal history of colonic polyps: Secondary | ICD-10-CM | POA: Insufficient documentation

## 2017-11-18 NOTE — Telephone Encounter (Signed)
Rx registry checked. Last fill date is 10/12/17 for #30. Last OV was 09/23/17 and next OV is 04/07/18.

## 2017-11-19 ENCOUNTER — Telehealth: Payer: Self-pay | Admitting: Neurology

## 2017-11-19 NOTE — Telephone Encounter (Signed)
Resent the medication to the [facility] pharmacy for the pt

## 2017-11-19 NOTE — Telephone Encounter (Signed)
Called the mother back. I offered several earlier apt in august the soonest one she was available to take was 01/06/2018 at 10:30 am. Will place the pt on wait list if something opens up sooner. Pt can only come on mon and wed.

## 2017-11-19 NOTE — Telephone Encounter (Signed)
Dawn/[facility] [id number] said the pt is getting all her medications at [facility]. Dawn spoke with them this morning and confirmed this. She is going to shred the script forclonazePAM (KLONOPIN) 1 MG tablet  and is asking for a new script to be sent to [facility]. Please update the patients pharmacy list.  Thank you

## 2017-11-19 NOTE — Telephone Encounter (Signed)
Pt mother [facility] requesting a call to discuss the pt being seen sooner.

## 2017-11-25 NOTE — Telephone Encounter (Signed)
Called the patient and offered the apt on 12/02/17 at 2:30 pm with check in of 2 pm. Mother agreed to this apt. I have taken the sept apt off

## 2017-11-27 ENCOUNTER — Ambulatory Visit (INDEPENDENT_AMBULATORY_CARE_PROVIDER_SITE_OTHER): Payer: Medicare Other | Admitting: Vascular Surgery

## 2017-11-27 ENCOUNTER — Encounter (INDEPENDENT_AMBULATORY_CARE_PROVIDER_SITE_OTHER): Payer: Self-pay | Admitting: Vascular Surgery

## 2017-11-27 VITALS — BP 143/60 | HR 73 | Resp 15 | Ht 65.0 in | Wt 215.0 lb

## 2017-11-27 DIAGNOSIS — N186 End stage renal disease: Secondary | ICD-10-CM

## 2017-11-27 DIAGNOSIS — Z992 Dependence on renal dialysis: Secondary | ICD-10-CM | POA: Diagnosis not present

## 2017-11-27 NOTE — Patient Instructions (Signed)
Vascular Access for Hemodialysis A vascular access is a connection between two blood vessels that allows blood to be easily removed from the body and returned to the body during hemodialysis. Hemodialysis is a procedure in which a machine outside of the body filters the blood. There are three types of vascular accesses:  Arteriovenous fistula. This is a connection between an artery and a vein (usually in the arm) that is made by sewing them together. Blood in the artery flows directly into the vein, causing it to get larger over time. This makes it easier for the vein to be used for hemodialysis. An arteriovenous fistula takes 1-6 months to develop after surgery.  Arteriovenous graft. This is a connection between an artery and a vein in the arm that is made with a tube. An arteriovenous graft can be used within 2-3 weeks of surgery.  Venous catheter. This is a thin, flexible tube that is placed in a large vein (usually in the neck, chest, or groin). A venous catheter for hemodialysis contains two tubes that come out of the skin. A venous catheter can be used right away. It is usually used as a temporary access if you need hemodialysis before a fistula or graft has developed. It may also be used as a permanent access if a fistula or graft cannot be created.  Which type of access is best for me? The type of access that is best for you depends on the size and strength of your veins. A fistula is usually the preferred type of access. It can last several years and is less likely than the other types of accesses to become infected or to cause blood clots within a blood vessel (thrombosis). However, a fistula is not an option for everyone. If your veins are not the right size, a graft may be used instead. Grafts require you to have strong veins. If your veins are not strong enough for a graft, a catheter may be used. Catheters are more likely than fistulas and grafts to become infected or to have  thrombosis. Sometimes, only one type of access is an option. Your health care provider will help you determine which type of access is best for you. How is a vascular access used? The way the access is used depends on the type of access:  If the access is a fistula or graft, two needles are inserted through the skin into the access before each hemodialysis session. Blood leaves the body through one of the needles and travels through a tube to the hemodialysis machine (dialyzer). It then flows through another tube and returns to the body through the second needle.  If the access is a catheter, one tube is connected directly to the tube that leads to the dialyzer and the other is connected to a tube that leads away from the dialyzer. Blood leaves the body through one tube and returns to the body through the other.  What kind of problems can occur with vascular accesses?  Blood clots within a blood vessel (thrombosis). Thrombosis can lead to a narrowing of a blood vessel or tube (stenosis). If thrombosis occurs frequently, another access site may be created as a backup.  Infection. These problems are most likely to occur with a venous catheter and least likely to occur with an arteriovenous fistula. How do I care for my vascular access? Wear a medical alert bracelet. This tells health care providers that you are a dialysis patient in the case of an emergency and   allows them to care for your veins appropriately. If you have a graft or fistula:  A "bruit" is a noise that is heard with a stethoscope and a "thrill" is a vibration felt over the graft or fistula. The presence of the bruit and thrill indicates that the access is working. You will be taught to feel for the thrill each day. If this is not felt, the access may be clotted. Call your health care provider.  You may use the arm where your vascular access is located freely after the site heals. Keep the following in mind: ? Avoid pressure on the  arm. ? Avoid lifting heavy objects with the arm. ? Avoid sleeping on the arm. ? Avoid wearing tight-sleeved shirts or jewelry around the graft or fistula.  Do not allow blood pressure monitoring or needle punctures on the side where the graft or fistula is located.  With permission from your health care provider, you may do exercises to help with blood flow through a fistula. These exercises involve squeezing a rubber ball or other soft objects as instructed.  Contact a health care provider if:  Chills develop.  You have an oral temperature above 102 F (38.9 C).  Swelling around the graft or fistula gets worse.  New pain develops.  Pus or other fluid (drainage) is seen at the vascular access site.  Skin redness or red streaking is seen on the skin around, above, or below the vascular access. Get help right away if:  Pain, numbness, or an unusual pale skin color develops in the hand on the side of your fistula.  Dizziness or weakness develops that you have not had before.  The vascular access has bleeding that cannot be easily controlled. This information is not intended to replace advice given to you by your health care provider. Make sure you discuss any questions you have with your health care provider. Document Released: 07/12/2002 Document Revised: 09/27/2015 Document Reviewed: 09/07/2012 Elsevier Interactive Patient Education  2017 [employer]  

## 2017-11-27 NOTE — Assessment & Plan Note (Signed)
The patient has a well-functioning left radiocephalic AV fistula.  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  No intervention should be performed on this at this time.  Rotating Nina Johnston access sites and trying not to stick the aneurysmal segment would be in Nina Johnston best interest.

## 2017-11-27 NOTE — Progress Notes (Signed)
MRN : [id number]  Nina Johnston is a 57 y.o. (05-Jun-1960) female who presents with chief complaint of  Chief Complaint  Patient presents with  . Follow-up    Aneurysm in access  .  History of Present Illness: Patient returns today in follow up of her dialysis access.  Apparently, she had some difficulties with access at the center last week.  1 of the techs told her that this aneurysm needed to be fixed.  This is a very small aneurysm with no skin threat.  When the head of the center came in he was less concerned about this.  She has had no prior major bleeding or other issues.  Her access flow rates are normal.  Current Outpatient Medications  Medication Sig Dispense Refill  . aspirin 81 MG tablet Take 81 mg by mouth daily.     Marland Kitchen b complex vitamins tablet Take 1 tablet by mouth at bedtime.     . benzonatate (TESSALON) 200 MG capsule Take by mouth.    . Cholecalciferol (VITAMIN D3) 1000 units CAPS Take by mouth.    . clonazePAM (KLONOPIN) 1 MG tablet TAKE ONE TABLET BY MOUTH AT BEDTIME 30 tablet 3  . cyanocobalamin (,VITAMIN B-12,) 1000 MCG/ML injection Inject 1,000 mcg into the muscle every 14 (fourteen) days.     Marland Kitchen epoetin alfa (EPOGEN,PROCRIT) 3000 UNIT/ML injection Inject 3,000 Units into the vein every Monday, Wednesday, and Friday.    . ethosuximide (ZARONTIN) 250 MG/5ML solution TAKE 5ML BY MOUTH 2 TIMES DAILY (Patient taking differently: Take 250 mg by mouth 2 (two) times daily. ) 474 mL 3  . famotidine (PEPCID) 40 MG tablet Take by mouth.    . Febuxostat (ULORIC) 80 MG TABS Take 80 mg by mouth daily.     . ferric citrate (AURYXIA) 1 GM 210 MG(Fe) tablet Take 2 tablets by mouth 2 (two) times daily.     . fexofenadine (ALLEGRA) 180 MG tablet Take 180 mg by mouth daily.     . Fluticasone-Salmeterol (ADVAIR DISKUS) 250-50 MCG/DOSE AEPB Inhale 1 puff into the lungs 2 (two) times daily.    . folic acid-vitamin b complex-vitamin c-selenium-zinc (DIALYVITE) 3 MG TABS tablet Take 1  tablet by mouth daily.    . furosemide (LASIX) 20 MG tablet Take 30 mg by mouth 2 (two) times daily.     . heparin 1000 unit/ml SOLN injection Inject into the peritoneum as needed.    . hydrALAZINE (APRESOLINE) 100 MG tablet Take 100 mg by mouth See admin instructions. Take 100 mg by mouth twice daily on non-dialysis days. Take 100 mg by mouth at bedtime on dialysis days Tuesday, Thursday and Saturday    . insulin glargine (LANTUS) 100 UNIT/ML injection Inject 22 Units into the skin at bedtime.     . insulin lispro (HUMALOG) 100 UNIT/ML injection Inject 10-15 Units into the skin 3 (three) times daily with meals. Per sliding scale    . LAMICTAL 200 MG tablet Take 1.5 tablets (300 mg total) by mouth 2 (two) times daily. 90 tablet 5  . levothyroxine (SYNTHROID) 150 MCG tablet Take 150 mcg by mouth daily before breakfast.     . metoprolol succinate (TOPROL XL) 50 MG 24 hr tablet Take 1 tablet (50 mg total) by mouth 2 (two) times daily. Take with or immediately following a meal. (Patient taking differently: Take 75 mg by mouth See admin instructions. Take 75 mg by mouth twice daily on non dialysis days. Take 75 mg by  mouth at bedtime on dialysis days Tuesday, Thursday and Saturday.) 60 tablet 11  . montelukast (SINGULAIR) 10 MG tablet Take 10 mg by mouth at bedtime.    Marland Kitchen nystatin-triamcinolone (MYCOLOG II) cream Apply 1 application topically 2 (two) times daily. (Patient taking differently: Apply 1 application topically daily as needed (rash). ) 30 g 1  . pantoprazole (PROTONIX) 40 MG tablet Take 40 mg by mouth every morning.     . Probiotic Product (ALIGN) 4 MG CAPS Take 4 mg by mouth daily.     . TEGRETOL-XR 100 MG 12 hr tablet Take 1 tablet bid for 2 weeks and than 1 in Pm for the rest of the bottle. 180 tablet 2  . triamcinolone (NASACORT ALLERGY 24HR) 55 MCG/ACT AERO nasal inhaler Place 2 sprays into the nose at bedtime.     Marland Kitchen amLODipine (NORVASC) 5 MG tablet Take 5 mg by mouth See admin instructions.  Take 5 mg by mouth daily except do not take on Tuesday, Thursday and Saturday.    . B Complex-C-Folic Acid (DIALYVITE TABLET) TABS Take 1 tablet by mouth daily.  12  . UNIFINE PENTIPS 32G X 4 MM MISC USE UP TO 5 TIMES DAILY AS DIRECTED  3   No current facility-administered medications for this visit.     Past Medical History:  Diagnosis Date  . A-V fistula ([place])   . Acute bacterial sinusitis 04/19/2014  . Adaptive colitis   . Anginal pain ([place])   . Anxiety   . Anxiety and depression   . Arthritis   . Asthma   . 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 Benign essential HTN 12/26/2014  . Bipolar disorder ([place])   . Bronchitis   . Carotid artery narrowing 12/22/2014  . Cervical prolapse   . CHF (congestive heart failure) ([place])   . CKD (chronic kidney disease)   . Concussion 01-14-14   Fall   . COPD (chronic obstructive pulmonary disease) ([place])   . Coronary artery disease   . Depression   . Diabetes mellitus without complication ([place])   . Dialysis patient ([place])   . Diplopia   . Dupuytren's contracture of foot   . Essential (primary) hypertension 02/12/2015  . Family history of thyroid problem   . GERD (gastroesophageal reflux disease)   . Gout   . Heart disease    enlarged because of COPD  . Heart murmur   . Hepatomegaly   . HLD (hyperlipidemia)   . Hyperactive airway disease   . Hyperkalemia   . Hypertension   . Hyperthyroidism   . Irritable colon   . Morbid obesity ([place])   . Multiple thyroid nodules   . OAB (overactive bladder)   . Pernicious anemia   . Post menopausal syndrome   . Post-concussion syndrome 03/08/2014  . Post-traumatic brain syndrome   . Prolapsed uterus   . PSVT (paroxysmal supraventricular tachycardia) ([place])   . Renal calculus   . Renal disease    w/ GFR 29-may be due to diabetes  . Seizures ([place])    last seizure 2012. has had spells since with no knowledge. last was 1 month  ago  . Sleep apnea   . 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 Transient alteration of awareness 11/10/2012  . Urinary incontinence with continuous leakage 03/08/2014    Past Surgical History:  Procedure Laterality  Date  . A/V FISTULAGRAM Left 07/21/2016   Procedure: A/V Fistulagram;  Surgeon: Algernon Huxley, MD;  Location: [place] [facility];  Service: Cardiovascular;  Laterality: Left;  . A/V FISTULAGRAM Left 12/24/2016   Procedure: A/V Fistulagram;  Surgeon: Algernon Huxley, MD;  Location: [place] [facility];  Service: Cardiovascular;  Laterality: Left;  . A/V FISTULAGRAM Left 06/29/2017   Procedure: A/V FISTULAGRAM;  Surgeon: Algernon Huxley, MD;  Location: [place] [facility];  Service: Cardiovascular;  Laterality: Left;  . A/V SHUNT INTERVENTION N/A 07/21/2016   Procedure: A/V Shunt Intervention;  Surgeon: Algernon Huxley, MD;  Location: [place] [facility];  Service: Cardiovascular;  Laterality: N/A;  . A/V SHUNT INTERVENTION N/A 12/24/2016   Procedure: A/V SHUNT INTERVENTION;  Surgeon: Algernon Huxley, MD;  Location: [place] [facility];  Service: Cardiovascular;  Laterality: N/A;  . AVF    . CHOLECYSTECTOMY    . COLONOSCOPY    . COLONOSCOPY WITH PROPOFOL N/A 03/18/2017   Procedure: COLONOSCOPY WITH PROPOFOL;  Surgeon: Manya Silvas, MD;  Location: [facility];  Service: Endoscopy;  Laterality: N/A;  . ENDOMETRIAL BIOPSY     ablation, uterine  . ESOPHAGOGASTRODUODENOSCOPY (EGD) WITH PROPOFOL N/A 03/18/2017   Procedure: ESOPHAGOGASTRODUODENOSCOPY (EGD) WITH PROPOFOL;  Surgeon: Manya Silvas, MD;  Location: [facility];  Service: Endoscopy;  Laterality: N/A;  . HERNIA REPAIR    . HERNIA REPAIR    . PERIPHERAL VASCULAR CATHETERIZATION N/A 08/23/2015   Procedure: Dialysis/Perma Catheter Insertion;  Surgeon: Algernon Huxley, MD;  Location: [place] [facility];  Service: Cardiovascular;  Laterality: N/A;  . PERIPHERAL VASCULAR CATHETERIZATION N/A  11/09/2015   Procedure: Dialysis/Perma Catheter Removal;  Surgeon: Katha Cabal, MD;  Location: [place] [facility];  Service: Cardiovascular;  Laterality: N/A;  . PERIPHERAL VASCULAR CATHETERIZATION Left 04/23/2016   Procedure: A/V Fistulagram;  Surgeon: Algernon Huxley, MD;  Location: [place] [facility];  Service: Cardiovascular;  Laterality: Left;  . PORT A CATH REVISION           Family History  Problem Relation Age of Onset  . Diabetes Mother   . Lumbar disc disease Mother   . Migraines Mother   . Hypertension Mother   . Cancer - Prostate Father     mild  . Diabetes Father   . Hypertension Father   . Breast cancer Neg Hx   . Ovarian cancer Neg Hx   . Colon cancer Neg Hx   . Heart disease Neg Hx      Social History     Social History  Substance Use Topics  . Smoking status: Never Smoker  . Smokeless tobacco: Never Used  . Alcohol use No  No IV drug use       Allergies  Allergen Reactions  . Cinnamon     Other reaction(s): Unknown  . Procrit [Epoetin Alfa]     Other reaction(s): Other (See Comments) Seizures, grand mal Seizures, grand mal  . Ace Inhibitors Cough  . Azithromycin     Other reaction(s): Other (See Comments) seizures  . Iron     Can not take in IV Form - causes seizures   . Neomycin-Bacitracin Zn-Polymyx     Other reaction(s): Unknown  . Sulfa Antibiotics     Other reaction(s): Other (See Comments) Due to risks for seizures.        REVIEW OF SYSTEMS(Negative unless checked)  Constitutional: '[]'$ Weight loss'[]'$ Fever'[]'$ Chills Cardiac:'[]'$ Chest pain'[]'$ Chest pressure'[]'$ Palpitations '[]'$ Shortness of breath when  laying flat '[]'$ Shortness of breath at rest '[]'$ Shortness of breath with exertion. Vascular: '[]'$ Pain in legs with walking'[]'$ Pain in legsat rest'[]'$ Pain in legs when laying flat '[]'$ Claudication '[]'$ Pain in feet when walking '[]'$ Pain in feet at rest '[]'$ Pain in feet when laying  flat '[]'$ History of DVT '[]'$ Phlebitis '[x]'$ Swelling in legs '[]'$ Varicose veins '[]'$ Non-healing ulcers Pulmonary: '[]'$ Uses home oxygen '[]'$ Productive cough'[]'$ Hemoptysis '[]'$ Wheeze '[]'$ COPD '[]'$ Asthma Neurologic: '[]'$ Dizziness '[]'$ Blackouts '[]'$ Seizures '[]'$ History of stroke '[]'$ History of TIA'[]'$ Aphasia '[]'$ Temporary blindness'[]'$ Dysphagia '[]'$ Weaknessor numbness in arms '[]'$ Weakness or numbnessin legs Musculoskeletal: '[]'$ Arthritis '[]'$ Joint swelling '[]'$ Joint pain '[]'$ Low back pain Hematologic:'[]'$ Easy bruising'[x]'$ Easy bleeding '[]'$ Hypercoagulable state '[x]'$ Anemic '[]'$ Hepatitis Gastrointestinal:'[]'$ Blood in stool'[]'$ Vomiting blood'[]'$ Gastroesophageal reflux/heartburn'[]'$ Abdominal pain Genitourinary: '[x]'$ Chronic kidney disease '[]'$ Difficulturination '[]'$ Frequenturination '[]'$ Burning with urination'[]'$ Hematuria Skin: '[]'$ Rashes '[]'$ Ulcers '[]'$ Wounds Psychological: '[]'$ History of anxiety'[]'$ History of major depression.    Physical Examination  BP (!) 143/60 (BP Location: Right Arm, Patient Position: Sitting)   Pulse 73   Resp 15   Ht '5\' 5"'$  (1.651 m)   Wt 215 lb (97.5 kg)   BMI 35.78 kg/m  Gen:  WD/WN, NAD Head: [place]/AT, No temporalis wasting. Ear/Nose/Throat: Hearing grossly intact, nares w/o erythema or drainage Eyes: Conjunctiva clear. Sclera non-icteric Neck: Supple.  Trachea midline Pulmonary:  Good air movement, no use of accessory muscles.  Cardiac: RRR, no JVD Vascular: good thrill in left arm AVF.  Small aneurysm near arterial access site.  No skin threat Vessel Right Left  Radial Palpable Palpable                                    Musculoskeletal: M/S 5/5 throughout.  No deformity or atrophy. Neurologic: Sensation grossly intact in extremities.  Symmetrical.  Speech is fluent.  Psychiatric: Judgment intact, Mood & affect appropriate for pt's clinical situation. Dermatologic: No rashes or ulcers noted.  No cellulitis or open wounds.       Labs Recent  Results (from the past 2160 hour(s))  Glucose, capillary     Status: Abnormal   Collection Time: 10/24/17  9:48 PM  Result Value Ref Range   Glucose-Capillary 225 (H) 65 - 99 mg/dL   Comment 1 Notify RN    Comment 2 Document in Chart   Basic metabolic panel     Status: Abnormal   Collection Time: 10/24/17  9:55 PM  Result Value Ref Range   Sodium 140 135 - 145 mmol/L   Potassium 3.9 3.5 - 5.1 mmol/L   Chloride 99 (L) 101 - 111 mmol/L   CO2 31 22 - 32 mmol/L   Glucose, Bld 217 (H) 65 - 99 mg/dL   BUN 17 6 - 20 mg/dL   Creatinine, Ser 2.96 (H) 0.44 - 1.00 mg/dL   Calcium 9.4 8.9 - 10.3 mg/dL   GFR calc non Af Amer 17 (L) >60 mL/min   GFR calc Af Amer 19 (L) >60 mL/min    Comment: (NOTE) The eGFR has been calculated using the CKD EPI equation. This calculation has not been validated in all clinical situations. eGFR's persistently <60 mL/min signify possible Chronic Kidney Disease.    Anion gap 10 5 - 15    Comment: Performed at [facility], [address] [place], [place] [address]  CBC     Status: Abnormal   Collection Time: 10/24/17  9:55 PM  Result Value Ref Range   WBC 13.0 (H) 3.6 - 11.0 K/uL   RBC 3.22 (L) 3.80 - 5.20 MIL/uL   Hemoglobin 10.7 (L) 12.0 - 16.0 g/dL  HCT 32.1 (L) 35.0 - 47.0 %   MCV 99.9 80.0 - 100.0 fL   MCH 33.2 26.0 - 34.0 pg   MCHC 33.3 32.0 - 36.0 g/dL   RDW 15.5 (H) 11.5 - 14.5 %   Platelets 183 150 - 440 K/uL    Comment: Performed at [facility], [address] [place], [place] [address]  Glucose, capillary     Status: Abnormal   Collection Time: 10/24/17 11:56 PM  Result Value Ref Range   Glucose-Capillary 172 (H) 65 - 99 mg/dL  Urinalysis, Complete w Microscopic     Status: Abnormal   Collection Time: 10/24/17 11:58 PM  Result Value Ref Range   Color, Urine AMBER (A) YELLOW    Comment: BIOCHEMICALS MAY BE AFFECTED BY COLOR   APPearance CLOUDY (A) CLEAR   Specific Gravity, Urine 1.014 1.005 - 1.030   pH 7.0 5.0 -  8.0   Glucose, UA 150 (A) NEGATIVE mg/dL   Hgb urine dipstick NEGATIVE NEGATIVE   Bilirubin Urine NEGATIVE NEGATIVE   Ketones, ur NEGATIVE NEGATIVE mg/dL   Protein, ur 100 (A) NEGATIVE mg/dL   Nitrite NEGATIVE NEGATIVE   Leukocytes, UA MODERATE (A) NEGATIVE   RBC / HPF 6-10 0 - 5 RBC/hpf   WBC, UA >50 (H) 0 - 5 WBC/hpf   Bacteria, UA RARE (A) NONE SEEN   Squamous Epithelial / LPF 11-20 0 - 5   Mucus PRESENT     Comment: Performed at [facility], [address] [place], [place] [address]  POCT urinalysis dipstick     Status: Abnormal   Collection Time: 11/04/17  3:44 PM  Result Value Ref Range   Color, UA yellow    Clarity, UA clear    Glucose, UA Negative Negative   Bilirubin, UA 1+    Ketones, UA neg    Spec Grav, UA <=1.005 (A) 1.010 - 1.025   Blood, UA small    pH, UA 7.0 5.0 - 8.0   Protein, UA Positive (A) Negative    Comment: 2+   Urobilinogen, UA 0.2 0.2 or 1.0 E.U./dL   Nitrite, UA neg    Leukocytes, UA Small (1+) (A) Negative   Appearance yellow    Odor neg   Urine Culture     Status: None   Collection Time: 11/04/17  4:39 PM  Result Value Ref Range   Urine Culture, Routine Final report    Organism ID, Bacteria Comment     Comment: Culture shows less than 10,000 colony forming units of bacteria per milliliter of urine. This colony count is not generally considered to be clinically significant.     Radiology No results found.  Assessment/Plan Type 2 diabetes mellitus (HCC) blood glucose control important in reducing the progression of atherosclerotic disease. Also, involved in wound healing. On appropriate medications.   Essential (primary) hypertension blood pressure control important in reducing the progression of atherosclerotic disease. On appropriate oral medications.   End-stage renal disease on hemodialysis [facility]) The patient has a well-functioning left radiocephalic AV fistula.  The small aneurysm near the arterial access site is  not causing any skin threat or current issues.  No intervention should be performed on this at this time.  Rotating her access sites and trying not to stick the aneurysmal segment would be in her best interest.    Leotis Pain, MD  11/27/2017 4:09 PM    This note was created with Dragon medical transcription system.  Any errors from dictation are  purely unintentional

## 2017-12-02 ENCOUNTER — Ambulatory Visit (INDEPENDENT_AMBULATORY_CARE_PROVIDER_SITE_OTHER): Payer: Medicare Other | Admitting: Neurology

## 2017-12-02 ENCOUNTER — Encounter: Payer: Self-pay | Admitting: Neurology

## 2017-12-02 VITALS — BP 154/63 | HR 79 | Ht 62.0 in | Wt 215.0 lb

## 2017-12-02 DIAGNOSIS — E878 Other disorders of electrolyte and fluid balance, not elsewhere classified: Secondary | ICD-10-CM | POA: Diagnosis not present

## 2017-12-02 DIAGNOSIS — E109 Type 1 diabetes mellitus without complications: Secondary | ICD-10-CM | POA: Diagnosis not present

## 2017-12-02 DIAGNOSIS — R6889 Other general symptoms and signs: Secondary | ICD-10-CM

## 2017-12-02 MED ORDER — CLONAZEPAM 1 MG PO TABS: 1.0000 mg | ORAL_TABLET | Freq: Every evening | ORAL | 0 refills | Status: DC | PRN

## 2017-12-02 NOTE — Progress Notes (Signed)
[facility]   Provider:  Dr Brett Fairy Referring Provider: / Primary Care Physician:  Idelle Crouch, MD  Seizure like episodes - chief complaint.   HPI:  Nina Johnston is a 57 y.o. female here for a RV :  Interval history 7-31 -2019  Many (5)  more spells since May visit. She has continued with hemodialysis- only on dialysis days. . She has hemoglobin at 8.8, phosphorus 353. She blames [name] - her phosphate binder - to have caused diarrhea and caused her to be embarassed .She also gets some iron through [name]. She cut down the dose as she had to go many times to the bathroom during dialysis. Now phosphate is up and her anemia is more severe. She has also high blood sugars some days.   She is taking a nap after supper - this seems to help the prevent spells.   We try clonazepam pre- dialysis to reduce the post dialysis spells,- these can be seizures, electrolyte shifting, glucose, HTN related., and address phosphate-binding medication that may give less diarrhea. Nina Johnston will need to control Glucose, volume and naps times.    Interval history 09-23-2017, I have the pleasure of meeting with Nina Johnston in her family today, patient has been able to continue with regular hemodialysis and seems to be well dialyzed.  It took respiratory creation of an arteriovenous fistula to reach the patency, she had several revisions.  She had a spell on her way home last August another spell while in dialysis and then several short spells over September -October and January.  Epogen shots started at [facility] on 11 February and she gets additional shots every 2 days until 25 June 2017.  Starting dose with 3000 units and she is now reaching 5000 units.  She is also taking oral iron and Pepcid to help with gastritis and any blood loss from there.  Over 2 months after Epogen was initiated she had a spell in the afternoon while riding in a car, Nina Johnston had to vomit and on 28 April the second spell at  home that lasted about 10 minutes.  Her mother has witnessed both describes her as unresponsive to verbal haptic or acoustic stimuli or visual stimuli.  She seems to daydream she is breathing regularly there is no convulsion noted, and her mother has witnessed these on nondialysis days in the afternoon hours.  Her sugar has never been too low, she has not fallen she has not manifested any convulsions.  Given the almost 2 months time difference between Epogen and the spell occurrences I do not think that they are related I hope that the higher hemoglobin and hematocrit will help her having less lightheadedness less fatigue finding more energy overall.  At this time she is taking an oral iron citrate, and it seems not necessary to add IV iron.  I would like her to continue with the treatment of anemia as laid out.  She may remain on 5000 units and oral iron. Keppra -     Interval history from 08 June 2017,  I have the pleasure of seeing Nina Johnston today, following a spell on January 29 during dialysis in which she stared off and was not responsive.  She also had a spell the same night at home witnessed by her mother.  There were further spells on August 1, August 31, September 12 and 14 as well as 22nd, October 16 and October 23.  Only 2 of these spells were prolonged with associated  confusion or disorientation to her surroundings. Since he has saw her hematologist last Wednesday, her hematocrit and hemoglobin levels have been very low.  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  This occurred in 2011 and 2012.  She has not received any erythropoietin after that but I think she needs now.  In order to overcome this very severe anemia.  She has been transfused multiple times ( 9 times )  and that cannot be healthy either. Her hematologist and her nephrologist are both leading towards erythropoietin therapy again.  Her  nephrologist is Audiological scientist, pathologist is Evelena Asa at [facility]. It's causing problems with dialysis effectiveness ( BP of 80/ 40 mmHg) , and with SOB.    CD_ This Caucasian right-handed, single female has been followed in this practice since 2009. 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  She had such spells in August 2010 and September 2011 after carbamazepine was changed to a generic form of Carbatrol her blood levels were lower,  she also developed upper respiratory tract infections and pneumonia in 2011 and was unable for a while to use her CPAP which had been initiated in [place] for the treatment of obstructive sleep apnea. Paroxysmal events with mental status changes had continued,  03/17/2011  and 20 13 some of the spells are described as " automatisms"- continuing behavior while  the patient is staring off and acting without  reflecting not being  responsive to stimulation from the outside . After the spell passes, she became very sleepy each time. In August 2012 she was finally referred to an endocrinologist at [facility] after she had a hypoglycemic seizure and her diabetes medications were reduced, now her blood sugars are running between 150 and 200 in the morning,  fasting.Nina Johnston begun working out in 2013 with a Physiological scientist and was able to lose about 30 pounds . She returned to using her CPAP.her Hb A1 C. in October of last year was 6.1- she had no other hypoglycemic events reported since March 2013 . Also in 2013 she had to visit the local ER twice for abdominal spasms and cardiac / chest pain. negative work up. She was developing renal failure.  She has since been followed  for a decreased renal clearance by internal medicine and nephrology. The patient is treated for her spells  and remains on Tegretol. It she will need blood levels for her anti-epiletic medications. Dr. Geryl Councilman is debating to reduce her Klonopin , which may help reduce her fall risk as well. Vit D deficiency- takes supplement.    Note contact at [facility]; transplant. Dr . Talmadge Coventry,  [facility] [place] [phone]. History from 04/16/2016. Nina Johnston is here today for a routine revisit but she did have some healthcare over the last week. She is now on hemodialysis, and experienced bleeding at the AV fistula of her left arm during the last treatment. She notice that the patella was blood drenched but her treatment time was almost over. She also had to be evaluated at the local hospital emergency room for a malignant blood pressure peak. Her systolic blood pressure went up , she had a very high heart rate  between 150s and 160 bpm. The ED suspected that she had atrial flutter, and she had hypokalemia. Hgb was 8.2.  on last Monday she was seen by cardiology and told she had sinus tachycardia. Chest Xray as she coughed blood. mammogram was due, normal, and potassium /agnesium was normalized.   Interval history from 12/03/2016. I have pleasure of seeing Mrs. Margeaux Swantek today who has been on hemodialysis since 18 month ago, she has a left arm AV fistula for hemodialysis access, her blood pressures have been lower especially in treatment and she was asked not to take hypertension medications prior to treatment. She will take the medication either after or the day before. I have The patient on antiepileptic medications after she had recurrent convulsions but we also found a correlation to hypoglycemia.  She has remained on Tegretol. She continues to take Lamictal. New problem: tachycardia since blood transfusion. Her hemoglobin was 6.9, her pulse rate after transfusion was well in the 120s. Pulse rate addressed today  was 120. Please note that the patient had also tachycardia up to rates between 150s and 160 in December 2017. At that time she was diagnosed with hypokalemia, her hemoglobin then was 8.2. She needs to speak to her nephrologist.   Review of Systems: Out of a complete 14 system review, the patient complains of only the following symptoms:       Social History   Socioeconomic History  . Marital status: Single    Spouse name: Not on file  . Number of children: 0  . Years of education:  college  . Highest education level: Not on file  Occupational History  . 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 Financial resource strain: Not on file  . Food insecurity:    Worry: Not on file    Inability: Not on file  . Transportation needs:    Medical: Not on file    Non-medical: Not on file  Tobacco Use  . Smoking status: Never Smoker  . Smokeless tobacco: Never Used  Substance and Sexual Activity  . Alcohol use: No  . Drug use: No  . Sexual activity: Not Currently  Lifestyle  . Physical activity:    Days per week: Not on file    Minutes per session: Not on file  . Stress: Not on file  Relationships  . Social connections:    Talks on phone: Not on file    Gets together: Not on file    Attends religious service: Not on file    Active member of club or organization: Not on file    Attends meetings of clubs or organizations: Not on file    Relationship status: Not on file  . Intimate partner violence:    Fear of current or ex partner: Not on file    Emotionally abused: Not on file    Physically abused: Not on file    Forced sexual activity: Not on file  Other Topics Concern  . Not on file  Social History Narrative   Patient is single and lives with her parents.   Patient is disabled.   Patient has a college education.   Patient is right- handed.   Patient drinks tea occasionally. 2 glasses of tea when they eat out.    Family History  Problem Relation Age of Onset  .  Diabetes Mother   . Lumbar disc disease Mother   . Migraines Mother   . Hypertension Mother   . Cancer - Prostate Father  mild  . Diabetes Father   . Hypertension Father   . Breast cancer Neg Hx   . Ovarian cancer Neg Hx   . Colon cancer Neg Hx   . Heart disease Neg Hx   . Kidney cancer Neg Hx   . Bladder Cancer Neg Hx     Past Medical History:  Diagnosis Date  . A-V fistula ([place])   . Acute bacterial sinusitis 04/19/2014  . Adaptive colitis   . Anginal pain ([place])   . Anxiety   . Anxiety and depression   . Arthritis   . Asthma   . 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 Benign essential HTN 12/26/2014  . Bipolar disorder ([place])   . Bronchitis   . Carotid artery narrowing 12/22/2014  . Cervical prolapse   . CHF (congestive heart failure) ([place])   . CKD (chronic kidney disease)   . Concussion 01-14-14   Fall   . COPD (chronic obstructive pulmonary disease) ([place])   . Coronary artery disease   . Depression   . Diabetes mellitus without complication ([place])   . Dialysis patient ([place])   . Diplopia   . Dupuytren's contracture of foot   . Essential (primary) hypertension 02/12/2015  . Family history of thyroid problem   . GERD (gastroesophageal reflux disease)   . Gout   . Heart disease    enlarged because of COPD  . Heart murmur   . Hepatomegaly   . HLD (hyperlipidemia)   . Hyperactive airway disease   . Hyperkalemia   . Hypertension   . Hyperthyroidism   . Irritable colon   . Morbid obesity ([place])   . Multiple thyroid nodules   . OAB (overactive bladder)   . Pernicious anemia   . Post menopausal syndrome   . Post-concussion syndrome 03/08/2014  . Post-traumatic brain syndrome   . Prolapsed uterus   . PSVT (paroxysmal supraventricular tachycardia) ([place])   . Renal calculus   . Renal disease    w/ GFR 29-may be due to diabetes  . Seizures ([place])    last seizure 2012. has had spells since  with no knowledge. last was 1 month ago  . Sleep apnea   . 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 Transient alteration of awareness 11/10/2012  . Urinary incontinence with continuous leakage 03/08/2014    Past Surgical History:  Procedure Laterality Date  . A/V FISTULAGRAM Left 07/21/2016   Procedure: A/V Fistulagram;  Surgeon: Algernon Huxley, MD;  Location: [place] [facility];  Service: Cardiovascular;  Laterality: Left;  . A/V FISTULAGRAM Left 12/24/2016   Procedure: A/V Fistulagram;  Surgeon: Algernon Huxley, MD;  Location: [place] [facility];  Service: Cardiovascular;  Laterality: Left;  . A/V FISTULAGRAM Left 06/29/2017   Procedure: A/V FISTULAGRAM;  Surgeon: Algernon Huxley, MD;  Location: [place] [facility];  Service: Cardiovascular;  Laterality: Left;  . A/V SHUNT INTERVENTION N/A 07/21/2016   Procedure: A/V Shunt Intervention;  Surgeon: Algernon Huxley, MD;  Location: [place] [facility];  Service: Cardiovascular;  Laterality: N/A;  . A/V SHUNT INTERVENTION N/A 12/24/2016   Procedure: A/V SHUNT INTERVENTION;  Surgeon: Algernon Huxley, MD;  Location: [place] [facility];  Service: Cardiovascular;  Laterality: N/A;  . AVF    . CHOLECYSTECTOMY    . COLONOSCOPY    . COLONOSCOPY  WITH PROPOFOL N/A 03/18/2017   Procedure: COLONOSCOPY WITH PROPOFOL;  Surgeon: Manya Silvas, MD;  Location: [facility] ENDOSCOPY;  Service: Endoscopy;  Laterality: N/A;  . ENDOMETRIAL BIOPSY     ablation, uterine  . ESOPHAGOGASTRODUODENOSCOPY (EGD) WITH PROPOFOL N/A 03/18/2017   Procedure: ESOPHAGOGASTRODUODENOSCOPY (EGD) WITH PROPOFOL;  Surgeon: Manya Silvas, MD;  Location: [facility] ENDOSCOPY;  Service: Endoscopy;  Laterality: N/A;  . HERNIA REPAIR    . HERNIA REPAIR    . PERIPHERAL VASCULAR CATHETERIZATION N/A 08/23/2015   Procedure: Dialysis/Perma Catheter Insertion;  Surgeon: Algernon Huxley, MD;  Location: [place] [facility];  Service: Cardiovascular;  Laterality: N/A;  .  PERIPHERAL VASCULAR CATHETERIZATION N/A 11/09/2015   Procedure: Dialysis/Perma Catheter Removal;  Surgeon: Katha Cabal, MD;  Location: [place] [facility];  Service: Cardiovascular;  Laterality: N/A;  . PERIPHERAL VASCULAR CATHETERIZATION Left 04/23/2016   Procedure: A/V Fistulagram;  Surgeon: Algernon Huxley, MD;  Location: [place] [facility];  Service: Cardiovascular;  Laterality: Left;  . PORT A CATH REVISION      Current Outpatient Medications  Medication Sig Dispense Refill  . aspirin 81 MG tablet Take 81 mg by mouth daily.     Marland Kitchen b complex vitamins tablet Take 1 tablet by mouth at bedtime.     . B Complex-C-Folic Acid (DIALYVITE TABLET) TABS Take 1 tablet by mouth daily.  12  . benzonatate (TESSALON) 200 MG capsule Take by mouth.    . Cholecalciferol (VITAMIN D3) 1000 units CAPS Take by mouth.    . clonazePAM (KLONOPIN) 1 MG tablet TAKE ONE TABLET BY MOUTH AT BEDTIME 30 tablet 3  . cyanocobalamin (,VITAMIN B-12,) 1000 MCG/ML injection Inject 1,000 mcg into the muscle every 14 (fourteen) days.     Marland Kitchen epoetin alfa (EPOGEN,PROCRIT) 3000 UNIT/ML injection Inject 3,000 Units into the vein every Monday, Wednesday, and Friday.    . ethosuximide (ZARONTIN) 250 MG/5ML solution TAKE 5ML BY MOUTH 2 TIMES DAILY (Patient taking differently: Take 250 mg by mouth 2 (two) times daily. ) 474 mL 3  . famotidine (PEPCID) 40 MG tablet Take by mouth.    . Febuxostat (ULORIC) 80 MG TABS Take 80 mg by mouth daily.     . ferric citrate (AURYXIA) 1 GM 210 MG(Fe) tablet Take 1 tablet by mouth 2 (two) times daily.     . fexofenadine (ALLEGRA) 180 MG tablet Take 180 mg by mouth daily.     . Fluticasone-Salmeterol (ADVAIR DISKUS) 250-50 MCG/DOSE AEPB Inhale 1 puff into the lungs 2 (two) times daily.    . folic acid-vitamin b complex-vitamin c-selenium-zinc (DIALYVITE) 3 MG TABS tablet Take 1 tablet by mouth daily.    . furosemide (LASIX) 20 MG tablet Take 30 mg by mouth 2 (two) times daily.     . heparin 1000  unit/ml SOLN injection Inject into the peritoneum as needed.    . hydrALAZINE (APRESOLINE) 100 MG tablet Take 100 mg by mouth See admin instructions. Take 100 mg by mouth twice daily on non-dialysis days. Take 100 mg by mouth at bedtime on dialysis days Tuesday, Thursday and Saturday    . insulin glargine (LANTUS) 100 UNIT/ML injection Inject 22 Units into the skin at bedtime.     . insulin lispro (HUMALOG) 100 UNIT/ML injection Inject 10-15 Units into the skin 3 (three) times daily with meals. Per sliding scale    . LAMICTAL 200 MG tablet Take 1.5 tablets (300 mg total) by mouth 2 (two) times daily. 90 tablet 5  .  levothyroxine (SYNTHROID) 150 MCG tablet Take 150 mcg by mouth daily before breakfast.     . metoprolol succinate (TOPROL XL) 50 MG 24 hr tablet Take 1 tablet (50 mg total) by mouth 2 (two) times daily. Take with or immediately following a meal. (Patient taking differently: Take 75 mg by mouth See admin instructions. Take 75 mg by mouth twice daily on non dialysis days. Take 75 mg by mouth at bedtime on dialysis days Tuesday, Thursday and Saturday.) 60 tablet 11  . montelukast (SINGULAIR) 10 MG tablet Take 10 mg by mouth at bedtime.    Marland Kitchen nystatin-triamcinolone (MYCOLOG II) cream Apply 1 application topically 2 (two) times daily. (Patient taking differently: Apply 1 application topically daily as needed (rash). ) 30 g 1  . pantoprazole (PROTONIX) 40 MG tablet Take 40 mg by mouth every morning.     . Probiotic Product (ALIGN) 4 MG CAPS Take 4 mg by mouth daily.     . TEGRETOL-XR 100 MG 12 hr tablet Take 1 tablet bid for 2 weeks and than 1 in Pm for the rest of the bottle. 180 tablet 2  . triamcinolone (NASACORT ALLERGY 24HR) 55 MCG/ACT AERO nasal inhaler Place 2 sprays into the nose at bedtime.     Marland Kitchen UNIFINE PENTIPS 32G X 4 MM MISC USE UP TO 5 TIMES DAILY AS DIRECTED  3  . amLODipine (NORVASC) 5 MG tablet Take 5 mg by mouth See admin instructions. Take 5 mg by mouth daily except do not take on  Tuesday, Thursday and Saturday.     No current facility-administered medications for this visit.     Allergies as of 12/02/2017 - Review Complete 12/02/2017  Allergen Reaction Noted  . Cinnamon Other (See Comments) 11/09/2012  . Procrit [epoetin alfa] Other (See Comments) 11/10/2012  . Ace inhibitors Cough 09/06/2014  . Azithromycin Other (See Comments) 09/06/2014  . Iron Other (See Comments) 03/31/2016  . Neomycin-bacitracin zn-polymyx Other (See Comments) 09/06/2014  . Sulfa antibiotics Other (See Comments) 09/06/2014    Vitals: BP (!) 154/63   Pulse 79   Ht 5\' 2"  (1.575 m)   Wt 215 lb (97.5 kg)   BMI 39.32 kg/m  Last Weight:  Wt Readings from Last 1 Encounters:  12/02/17 215 lb (97.5 kg)   Last Height:   Ht Readings from Last 1 Encounters:  12/02/17 5\' 2"  (1.575 m)   Vision Screening:   Physical exam:  General: The patient is awake, alert and appears not in acute distress. The patient is well groomed. Head: Normocephalic, atraumatic. Neck is supple.  Mallampati 1 - there is no uvula , neck circumference:15.5 inches.   On renal low potassium diet, she lost 45 pounds .  Cardiovascular:  Regular rate and rhythm, without  murmurs or carotid bruit, and without distended neck veins. Respiratory: Lungs are clear to auscultation. Skin: ankle  edema, no rash. Looking jaundiced, bronzed .  Dry skin, low TURGOR, appears dehydrated and dusky. Bronzed skin. .  Trunk: BMI is morbidly obese-  Abdominal hernia.  patient has normal posture.   Neurologic exam : The patient is awake and alert, oriented to place and time. Memory subjective described as intact.  There is a normal attention span & concentration ability. Speech is fluent without dysarthria or aphasia. Mood and affect are defensive . Cranial nerves: Pupils are equal and briskly reactive to light. No disrounded pupils,  Yellowish sclerea.   Visual fields by finger perimetry are intact.Hearing to finger rub intact. Facial  sensation  intact to fine touch. Facial motor strength is symmetric and tongue moves midline. Motor exam:  Normal tone and normal muscle bulk, obesity related attenuated DTR , and symmetric normal strength in all extremities. She has an AV access fistula on the left arm. Thrill.  Sensory:  Fine touch, pinprick and vibration were tested in all extremities and are presents in toes and feet. Proprioception is tested and normal. Coordination: Rapid alternating movements in the fingers/hands is tested and normal. Finger-to-nose maneuver tested and normal without evidence of ataxia, dysmetria or tremor. Gait and station: Patient walks without assistive device and is very slow - Appears truncally rigid, with no lumbar rotation, turns with 5 steps, and has reduced step width. Strength within normal limits. Stance is stable and normal. Tandem gait is fragmented. She waddels.  Deep tendon reflexes: in the upper and lower extremities are symmetric and intact.  Babinski maneuver response is down going on the left and equivocal on the right.    I was quite concerned about Mrs. Johnston's anemia and her constant need for transfusions, and iron supplementation.  In the past I was very worried about erythropoietin because it seems to be time correlated to the few generalized tonic-clonic seizures she has suffered. Erythropoietin was re initiated  At 3.000 units and gradually up to 5.000IU. and she regained Hgb and is less pale-  I  Want her to continue both erythropoetin and oral iron.   These spells occur in the afternoon on non dialysis days, starting as repeated swallowing , she is not responding to visual a or verbal stimuli - but she breathes regularly . The glucose is usually high, she takes AURYXIA for iron supplement and Pepcid. Unexplained      Recurrent convulsions due to hypoglycemia, hypotension on dialysis, but not due to erythropoetin.    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 creatinine is around 7.  Morbidly obese. Increasing weight- new dry weight 95.5 Kg-   Keep on anti epileptic meds, continue citric acid iron po.   She may try a trial dose of IV iron, ferrolicit or similar.     Larey Seat, MD   09-23-2017

## 2017-12-02 NOTE — Patient Instructions (Addendum)
Post dialysis spells-  Decreased awareness, staring.   Either due to BP changes or electrolytes.

## 2017-12-02 NOTE — Addendum Note (Signed)
Addended by: Larey Seat on: 12/02/2017 02:57 PM   Modules accepted: Orders

## 2017-12-07 ENCOUNTER — Ambulatory Visit: Payer: Medicare Other | Admitting: Neurology

## 2017-12-08 ENCOUNTER — Other Ambulatory Visit: Payer: Self-pay | Admitting: Neurology

## 2017-12-08 ENCOUNTER — Telehealth: Payer: Self-pay | Admitting: Neurology

## 2017-12-08 ENCOUNTER — Encounter: Payer: Self-pay | Admitting: Neurology

## 2017-12-08 DIAGNOSIS — Z992 Dependence on renal dialysis: Secondary | ICD-10-CM

## 2017-12-08 DIAGNOSIS — E875 Hyperkalemia: Secondary | ICD-10-CM

## 2017-12-08 NOTE — Telephone Encounter (Signed)
Per Dr Brett Fairy ok to send the order but the results and treatment should come from nephrologist. Order sent.

## 2017-12-08 NOTE — Telephone Encounter (Signed)
Pt called she is at [facility] at dialysis. Pt said a stat order for potassium (she said it is a 6) needs to be faxed to [phone]. If there are any questions please call Stacy @ [phone]

## 2017-12-21 ENCOUNTER — Ambulatory Visit: Payer: Medicare Other | Admitting: Neurology

## 2018-01-01 ENCOUNTER — Other Ambulatory Visit: Payer: Self-pay | Admitting: Internal Medicine

## 2018-01-01 DIAGNOSIS — R0789 Other chest pain: Secondary | ICD-10-CM

## 2018-01-06 ENCOUNTER — Ambulatory Visit: Payer: Self-pay | Admitting: Neurology

## 2018-01-08 ENCOUNTER — Emergency Department
Admission: EM | Admit: 2018-01-08 | Discharge: 2018-01-08 | Disposition: A | Discharge status: Home / Self Care | Payer: Medicare Other | Attending: Emergency Medicine | Admitting: Emergency Medicine

## 2018-01-08 ENCOUNTER — Encounter: Payer: Self-pay | Admitting: Emergency Medicine

## 2018-01-08 ENCOUNTER — Emergency Department: Payer: Medicare Other

## 2018-01-08 DIAGNOSIS — N12 Tubulo-interstitial nephritis, not specified as acute or chronic: Secondary | ICD-10-CM | POA: Diagnosis not present

## 2018-01-08 DIAGNOSIS — I132 Hypertensive heart and chronic kidney disease with heart failure and with stage 5 chronic kidney disease, or end stage renal disease: Secondary | ICD-10-CM | POA: Diagnosis not present

## 2018-01-08 DIAGNOSIS — Z79899 Other long term (current) drug therapy: Secondary | ICD-10-CM | POA: Diagnosis not present

## 2018-01-08 DIAGNOSIS — E039 Hypothyroidism, unspecified: Secondary | ICD-10-CM | POA: Diagnosis not present

## 2018-01-08 DIAGNOSIS — E1122 Type 2 diabetes mellitus with diabetic chronic kidney disease: Secondary | ICD-10-CM | POA: Diagnosis not present

## 2018-01-08 DIAGNOSIS — Z794 Long term (current) use of insulin: Secondary | ICD-10-CM | POA: Insufficient documentation

## 2018-01-08 DIAGNOSIS — Z7982 Long term (current) use of aspirin: Secondary | ICD-10-CM | POA: Diagnosis not present

## 2018-01-08 DIAGNOSIS — Z992 Dependence on renal dialysis: Secondary | ICD-10-CM | POA: Insufficient documentation

## 2018-01-08 DIAGNOSIS — J449 Chronic obstructive pulmonary disease, unspecified: Secondary | ICD-10-CM | POA: Diagnosis not present

## 2018-01-08 DIAGNOSIS — N186 End stage renal disease: Secondary | ICD-10-CM | POA: Diagnosis not present

## 2018-01-08 DIAGNOSIS — R1031 Right lower quadrant pain: Secondary | ICD-10-CM | POA: Diagnosis present

## 2018-01-08 DIAGNOSIS — I509 Heart failure, unspecified: Secondary | ICD-10-CM | POA: Diagnosis not present

## 2018-01-08 LAB — COMPREHENSIVE METABOLIC PANEL
ALT: 14 U/L (ref 0–44)
AST: 15 U/L (ref 15–41)
Albumin: 4.4 g/dL (ref 3.5–5.0)
Alkaline Phosphatase: 91 U/L (ref 38–126)
Anion gap: 11 (ref 5–15)
BUN: 39 mg/dL — ABNORMAL HIGH (ref 6–20)
CO2: 27 mmol/L (ref 22–32)
Calcium: 9 mg/dL (ref 8.9–10.3)
Chloride: 101 mmol/L (ref 98–111)
Creatinine, Ser: 5.48 mg/dL — ABNORMAL HIGH (ref 0.44–1.00)
GFR calc Af Amer: 9 mL/min — ABNORMAL LOW (ref >60)
GFR calc non Af Amer: 8 mL/min — ABNORMAL LOW (ref >60)
Glucose, Bld: 144 mg/dL — ABNORMAL HIGH (ref 70–99)
Potassium: 4.4 mmol/L (ref 3.5–5.1)
Sodium: 139 mmol/L (ref 135–145)
Total Bilirubin: 0.6 mg/dL (ref 0.3–1.2)
Total Protein: 7.1 g/dL (ref 6.5–8.1)

## 2018-01-08 LAB — CBC
HCT: 30.8 % — ABNORMAL LOW (ref 35.0–47.0)
Hemoglobin: 10.3 g/dL — ABNORMAL LOW (ref 12.0–16.0)
MCH: 33.7 pg (ref 26.0–34.0)
MCHC: 33.4 g/dL (ref 32.0–36.0)
MCV: 100.9 fL — ABNORMAL HIGH (ref 80.0–100.0)
Platelets: 209 10*3/uL (ref 150–440)
RBC: 3.05 MIL/uL — ABNORMAL LOW (ref 3.80–5.20)
RDW: 15.6 % — ABNORMAL HIGH (ref 11.5–14.5)
WBC: 12.2 10*3/uL — ABNORMAL HIGH (ref 3.6–11.0)

## 2018-01-08 LAB — URINALYSIS, COMPLETE (UACMP) WITH MICROSCOPIC
Bilirubin Urine: NEGATIVE
Glucose, UA: NEGATIVE mg/dL
Ketones, ur: NEGATIVE mg/dL
Nitrite: NEGATIVE
Protein, ur: 100 mg/dL — AB
Specific Gravity, Urine: 1.011 (ref 1.005–1.030)
pH: 6 (ref 5.0–8.0)

## 2018-01-08 LAB — LIPASE, BLOOD: Lipase: 32 U/L (ref 11–51)

## 2018-01-08 MED ORDER — SODIUM CHLORIDE 0.9 % IV SOLN
1.0000 g | Freq: Once | INTRAVENOUS | Status: AC
  Administered 2018-01-08: 1 g via INTRAVENOUS
  Filled 2018-01-08: qty 10

## 2018-01-08 MED ORDER — IOPAMIDOL (ISOVUE-300) INJECTION 61%
100.0000 mL | Freq: Once | INTRAVENOUS | Status: AC | PRN
  Administered 2018-01-08: 100 mL via INTRAVENOUS
  Filled 2018-01-08: qty 100

## 2018-01-08 MED ORDER — CEPHALEXIN 500 MG PO CAPS: 500.0000 mg | ORAL_CAPSULE | Freq: Three times a day (TID) | ORAL | 0 refills | Status: DC

## 2018-01-08 NOTE — ED Notes (Signed)
Patient transported to CT 

## 2018-01-08 NOTE — ED Provider Notes (Signed)
[facility] Emergency Department Provider Note   ____________________________________________   I have reviewed the triage vital signs and the nursing notes.   HISTORY  Chief Complaint Abdominal Pain   History limited by: Not Limited   HPI Nina Johnston is a 57 y.o. female who presents to the emergency department today because of concerns for abdominal pain.  Located in the right side of the right flank.  The pain started roughly 5 hours ago.  It started suddenly.  She states that it has not been accompanied by any diarrhea or vomiting.  She has not felt like she has had any fevers.  Denies similar symptoms in the past.  Has a history of kidney stones but this does not remind her of that.  Went to urgent care where they had concerns for possible appendicitis.   Per medical record review patient has a history of esrd on dialysis.  Past Medical History:  Diagnosis Date  . A-V fistula ([place])   . Acute bacterial sinusitis 04/19/2014  . Adaptive colitis   . Anginal pain ([place])   . Anxiety   . Anxiety and depression   . Arthritis   . Asthma   . 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 Benign essential HTN 12/26/2014  . Bipolar disorder ([place])   . Bronchitis   . Carotid artery narrowing 12/22/2014  . Cervical prolapse   . CHF (congestive heart failure) ([place])   . CKD (chronic kidney disease)   . Concussion 01-14-14   Fall   . COPD (chronic obstructive pulmonary disease) ([place])   . Coronary artery disease   . Depression   . Diabetes mellitus without complication ([place])   . Dialysis patient ([place])   . Diplopia   . Dupuytren's contracture of foot   . Essential (primary) hypertension 02/12/2015  . Family history of thyroid problem   . GERD (gastroesophageal reflux disease)   . Gout   . Heart disease    enlarged because of COPD  . Heart murmur   . Hepatomegaly   . HLD (hyperlipidemia)    . Hyperactive airway disease   . Hyperkalemia   . Hypertension   . Hyperthyroidism   . Irritable colon   . Morbid obesity ([place])   . Multiple thyroid nodules   . OAB (overactive bladder)   . Pernicious anemia   . Post menopausal syndrome   . Post-concussion syndrome 03/08/2014  . Post-traumatic brain syndrome   . Prolapsed uterus   . PSVT (paroxysmal supraventricular tachycardia) ([place])   . Renal calculus   . Renal disease    w/ GFR 29-may be due to diabetes  . Seizures ([place])    last seizure 2012. has had spells since with no knowledge. last was 1 month ago  . Sleep apnea   . 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 Transient alteration of awareness 11/10/2012  . Urinary incontinence with continuous leakage 03/08/2014    Patient Active Problem List   Diagnosis Date Noted  . Spells of decreased attentiveness 12/02/2017  . Postdialysis syndrome 12/02/2017  . 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 Anemia due to pre-ESRD treated with erythropoietin 09/23/2017  . Hemodialysis access, AV graft ([place]) 09/23/2017  . ESRD on dialysis ([place]) 02/06/2017  . Delay kidney tx func d/t fluid overload requiring acute dialysis ([place]) 12/03/2016  . Menopause 10/22/2016  .  Status post endometrial ablation 10/22/2016  . Swelling of limb 04/18/2016  . Kidney dialysis as the cause of abnormal reaction of the patient, or of later complication, without mention of misadventure at the time of the procedure (CODE) 04/18/2016  . End-stage renal disease on hemodialysis ([place]) 08/24/2015  . OAB (overactive bladder) 06/07/2015  . Incontinence 06/07/2015  . Airway hyperreactivity 06/06/2015  . Chronic kidney disease, stage V ([place]) 06/06/2015  . Binocular vision disorder with diplopia 06/06/2015  . Type 2 diabetes mellitus ([place]) 06/01/2015  . Severe diabetic hypoglycemia ([place]) 04/16/2015  . Convulsions ([place]) 02/12/2015  . Hypoglycemic reaction 02/12/2015  .  Chronic kidney disease (CKD), stage IV (severe) ([place]) 02/12/2015  . Other symptoms and signs concerning food and fluid intake 02/12/2015  . Absence of menstruation 02/12/2015  . Absolute anemia 02/12/2015  . Post menopausal syndrome 02/12/2015  . Calculus of kidney 02/12/2015  . Chronic obstructive pulmonary disease ([place]) 02/12/2015  . Bone/cartilage disorder 02/12/2015  . Urinary system disease 02/12/2015  . Encounter for gynecological examination without abnormal finding 02/12/2015  . 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 Essential (primary) hypertension 02/12/2015  . Gout 02/12/2015  . Hernia, internal 02/12/2015  . Hypoglycemia 02/12/2015  . Adaptive colitis 02/12/2015  . Arthritis, degenerative 02/12/2015  . Postprocedural state 02/12/2015  . Dupuytren's contracture of foot 02/12/2015  . Cutaneous eruption 02/12/2015  . 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 Absence of bladder continence 02/12/2015  . Cervical prolapse 02/12/2015  . Seizure ([place]) 02/12/2015  . Benign essential HTN 12/26/2014  . Carotid artery narrowing 12/22/2014  . Chest pain 12/22/2014  . Combined fat and carbohydrate induced hyperlipemia 12/22/2014  . Acute bacterial sinusitis 04/19/2014  . Anemia in chronic illness 03/16/2014  . Urinary incontinence with continuous leakage 03/08/2014  . Post-concussion syndrome 03/08/2014  . Total urinary incontinence 03/08/2014  . 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 Apnea, sleep 01/17/2014  . Patient awaiting renal transplant 06/03/2013  . Bipolar affective disorder ([place]) 12/16/2012  . Acid reflux 12/16/2012  . Class 2 severe obesity due to excess calories with serious comorbidity and body mass index (BMI) of 37.0 to 37.9 in adult ([place]) 12/16/2012  . Obstructive apnea 12/16/2012  . Addison anemia 12/16/2012  . Seizure disorder ([place]) 12/16/2012  . Ataxia 11/10/2012  . 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 Transient  alteration of awareness 11/10/2012  . Severe obesity (BMI >= 40) ([place]) 11/10/2012  . Morbid obesity ([place]) 11/10/2012  . Speech disorder 11/10/2012  . Encounter for general adult medical examination without abnormal findings 09/08/2012  . Multinodular goiter 01/29/2012  . Cardiac murmur 10/29/2011  . High potassium 10/28/2011  . Adult hypothyroidism 03/12/2011    Past Surgical History:  Procedure Laterality Date  . A/V FISTULAGRAM Left 07/21/2016   Procedure: A/V Fistulagram;  Surgeon: Algernon Huxley, MD;  Location: [place] [facility];  Service: Cardiovascular;  Laterality: Left;  . A/V FISTULAGRAM Left 12/24/2016   Procedure: A/V Fistulagram;  Surgeon: Algernon Huxley, MD;  Location: [place] [facility];  Service: Cardiovascular;  Laterality: Left;  . A/V FISTULAGRAM Left 06/29/2017   Procedure: A/V FISTULAGRAM;  Surgeon: Algernon Huxley, MD;  Location: [place] [facility];  Service: Cardiovascular;  Laterality: Left;  . A/V SHUNT INTERVENTION N/A 07/21/2016   Procedure: A/V Shunt Intervention;  Surgeon: Algernon Huxley, MD;  Location: [place] [facility];  Service: Cardiovascular;  Laterality: N/A;  . A/V SHUNT INTERVENTION N/A 12/24/2016  Procedure: A/V SHUNT INTERVENTION;  Surgeon: Algernon Huxley, MD;  Location: [place] [facility];  Service: Cardiovascular;  Laterality: N/A;  . AVF    . CHOLECYSTECTOMY    . COLONOSCOPY    . COLONOSCOPY WITH PROPOFOL N/A 03/18/2017   Procedure: COLONOSCOPY WITH PROPOFOL;  Surgeon: Manya Silvas, MD;  Location[facility] ENDOSCOPY;  Service: Endoscopy;  Laterality: N/A;  . ENDOMETRIAL BIOPSY     ablation, uterine  . ESOPHAGOGASTRODUODENOSCOPY (EGD) WITH PROPOFOL N/A 03/18/2017   Procedure: ESOPHAGOGASTRODUODENOSCOPY (EGD) WITH PROPOFOL;  Surgeon: Manya Silvas, MD;  Location: [facility] ENDOSCOPY;  Service: Endoscopy;  Laterality: N/A;  . HERNIA REPAIR    . HERNIA REPAIR    . PERIPHERAL VASCULAR CATHETERIZATION N/A 08/23/2015   Procedure: Dialysis/Perma Catheter  Insertion;  Surgeon: Algernon Huxley, MD;  Location: [place] [facility];  Service: Cardiovascular;  Laterality: N/A;  . PERIPHERAL VASCULAR CATHETERIZATION N/A 11/09/2015   Procedure: Dialysis/Perma Catheter Removal;  Surgeon: Katha Cabal, MD;  Location: [place] [facility];  Service: Cardiovascular;  Laterality: N/A;  . PERIPHERAL VASCULAR CATHETERIZATION Left 04/23/2016   Procedure: A/V Fistulagram;  Surgeon: Algernon Huxley, MD;  Location: [place] [facility];  Service: Cardiovascular;  Laterality: Left;  . PORT A CATH REVISION      Prior to Admission medications   Medication Sig Start Date End Date Taking? Authorizing Provider  amLODipine (NORVASC) 5 MG tablet Take 5 mg by mouth See admin instructions. Take 5 mg by mouth daily except do not take on Tuesday, Thursday and Saturday. 11/26/16 11/26/17  [provider]  aspirin 81 MG tablet Take 81 mg by mouth daily.     [provider]  b complex vitamins tablet Take 1 tablet by mouth at bedtime.     [provider]  B Complex-C-Folic Acid (DIALYVITE TABLET) TABS Take 1 tablet by mouth daily. 11/14/17   [provider]  benzonatate (TESSALON) 200 MG capsule Take by mouth. 10/30/17   [provider]  Cholecalciferol (VITAMIN D3) 1000 units CAPS Take by mouth.    [provider]  clonazePAM (KLONOPIN) 1 MG tablet TAKE ONE TABLET BY MOUTH AT BEDTIME 11/18/17   Dohmeier, Asencion Partridge, MD  clonazePAM (KLONOPIN) 1 MG tablet Take 1-2 tablets (1-2 mg total) by mouth at bedtime as needed and may repeat dose one time if needed for anxiety. 12/02/17   Dohmeier, Asencion Partridge, MD  cyanocobalamin (,VITAMIN B-12,) 1000 MCG/ML injection Inject 1,000 mcg into the muscle every 14 (fourteen) days.     [provider]  epoetin alfa (EPOGEN,PROCRIT) 3000 UNIT/ML injection Inject 3,000 Units into the vein every Monday, Wednesday, and Friday.    [provider]  ethosuximide (ZARONTIN) 250 MG/5ML solution TAKE 5ML  BY MOUTH 2 TIMES DAILY Patient taking differently: Take 250 mg by mouth 2 (two) times daily.  06/08/17   Dohmeier, Asencion Partridge, MD  famotidine (PEPCID) 40 MG tablet Take by mouth. 09/08/17 09/08/18  [provider]  Febuxostat (ULORIC) 80 MG TABS Take 80 mg by mouth daily.     [provider]  ferric citrate (AURYXIA) 1 GM 210 MG(Fe) tablet Take 1 tablet by mouth 2 (two) times daily.     [provider]  fexofenadine (ALLEGRA) 180 MG tablet Take 180 mg by mouth daily.     [provider]  Fluticasone-Salmeterol (ADVAIR DISKUS) 250-50 MCG/DOSE AEPB Inhale 1 puff into the lungs 2 (two) times daily.    [provider]  folic acid-vitamin b complex-vitamin  c-selenium-zinc (DIALYVITE) 3 MG TABS tablet Take 1 tablet by mouth daily.    [provider]  furosemide (LASIX) 20 MG tablet Take 30 mg by mouth 2 (two) times daily.     [provider]  heparin 1000 unit/ml SOLN injection Inject into the peritoneum as needed.    [provider]  hydrALAZINE (APRESOLINE) 100 MG tablet Take 100 mg by mouth See admin instructions. Take 100 mg by mouth twice daily on non-dialysis days. Take 100 mg by mouth at bedtime on dialysis days Tuesday, Thursday and Saturday    [provider]  insulin glargine (LANTUS) 100 UNIT/ML injection Inject 22 Units into the skin at bedtime.     [provider]  insulin lispro (HUMALOG) 100 UNIT/ML injection Inject 10-15 Units into the skin 3 (three) times daily with meals. Per sliding scale    [provider]  LAMICTAL 200 MG tablet Take 1.5 tablets (300 mg total) by mouth 2 (two) times daily. 10/01/17   Dohmeier, Asencion Partridge, MD  levothyroxine (SYNTHROID) 150 MCG tablet Take 150 mcg by mouth daily before breakfast.     [provider]  metoprolol succinate (TOPROL XL) 50 MG 24 hr tablet Take 1 tablet (50 mg total) by mouth 2 (two) times daily. Take with or immediately following a meal. Patient  taking differently: Take 75 mg by mouth See admin instructions. Take 75 mg by mouth twice daily on non dialysis days. Take 75 mg by mouth at bedtime on dialysis days Tuesday, Thursday and Saturday. 04/16/16   Dohmeier, Asencion Partridge, MD  montelukast (SINGULAIR) 10 MG tablet Take 10 mg by mouth at bedtime.    [provider]  nystatin-triamcinolone (MYCOLOG II) cream Apply 1 application topically 2 (two) times daily. Patient taking differently: Apply 1 application topically daily as needed (rash).  10/22/16   Defrancesco, Alanda Slim, MD  pantoprazole (PROTONIX) 40 MG tablet Take 40 mg by mouth every morning.     [provider]  Probiotic Product (ALIGN) 4 MG CAPS Take 4 mg by mouth daily.     [provider]  TEGRETOL-XR 100 MG 12 hr tablet Take 1 tablet bid for 2 weeks and than 1 in Pm for the rest of the bottle. 09/23/17   Dohmeier, Asencion Partridge, MD  triamcinolone (NASACORT ALLERGY 24HR) 55 MCG/ACT AERO nasal inhaler Place 2 sprays into the nose at bedtime.     [provider]  UNIFINE PENTIPS 32G X 4 MM MISC USE UP TO 5 TIMES DAILY AS DIRECTED 11/22/17   [provider]    Allergies Cinnamon; Procrit [epoetin alfa]; Ace inhibitors; Azithromycin; Iron; Neomycin-bacitracin zn-polymyx; and Sulfa antibiotics  Family History  Problem Relation Age of Onset  . Diabetes Mother   . Lumbar disc disease Mother   . Migraines Mother   . Hypertension Mother   . Cancer - Prostate Father        mild  . Diabetes Father   . Hypertension Father   . Breast cancer Neg Hx   . Ovarian cancer Neg Hx   . Colon cancer Neg Hx   . Heart disease Neg Hx   . Kidney cancer Neg Hx   . Bladder Cancer Neg Hx     Social History Social History   Tobacco Use  . Smoking status: Never Smoker  . Smokeless tobacco: Never Used  Substance Use Topics  . Alcohol use: No  . Drug use: No    Review of Systems Constitutional: No fever/chills Eyes: No visual  changes. ENT: No sore  throat. Cardiovascular: Denies chest pain. Respiratory: Denies shortness of breath. Gastrointestinal: Positive for right lower quadrant abdominal pain Genitourinary: Negative for dysuria. Musculoskeletal: Negative for back pain. Skin: Negative for rash. Neurological: Negative for headaches, focal weakness or numbness.  ____________________________________________   PHYSICAL EXAM:  VITAL SIGNS: ED Triage Vitals  Enc Vitals Group     BP 01/08/18 1758 (!) 145/57     Pulse Rate 01/08/18 1758 66     Resp 01/08/18 1758 18     Temp 01/08/18 1758 98.6 F (37 C)     Temp Source 01/08/18 1758 Oral     SpO2 01/08/18 1758 97 %     Weight 01/08/18 1758 215 lb (97.5 kg)     Height 01/08/18 1758 5\' 2"  (1.575 m)     Head Circumference --      Peak Flow --      Pain Score 01/08/18 1757 3   Constitutional: Alert and oriented.  Eyes: Conjunctivae are normal.  ENT      Head: Normocephalic and atraumatic.      Nose: No congestion/rhinnorhea.      Mouth/Throat: Mucous membranes are moist.      Neck: No stridor. Hematological/Lymphatic/Immunilogical: No cervical lymphadenopathy. Cardiovascular: Normal rate, regular rhythm.  No murmurs, rubs, or gallops.  Respiratory: Normal respiratory effort without tachypnea nor retractions. Breath sounds are clear and equal bilaterally. No wheezes/rales/rhonchi. Gastrointestinal: Soft and minimally tender in the right side of the abdomen. No rebound. No guarding.  Genitourinary: Deferred Musculoskeletal: Normal range of motion in all extremities. No lower extremity edema. Neurologic:  Normal speech and language. No gross focal neurologic deficits are appreciated.  Skin:  Skin is warm, dry and intact. No rash noted. Psychiatric: Mood and affect are normal. Speech and behavior are normal. Patient exhibits appropriate insight and judgment.  ____________________________________________    LABS (pertinent positives/negatives)  Lipase 32 CMP glu 144, cr  5.48 CBC wbc 12.2, hgb 10.3, plt 209 UA cloudy, small hgb dipstick, large leukocytes, 11-20 rbc, 21-50 wbc, squamous 11-20  ____________________________________________   EKG  None  ____________________________________________    RADIOLOGY  CT abd/pel No acute findings. Left ovarian cyst  ____________________________________________   PROCEDURES  Procedures  ____________________________________________   INITIAL IMPRESSION / ASSESSMENT AND PLAN / ED COURSE  Pertinent labs & imaging results that were available during my care of the patient were reviewed by me and considered in my medical decision making (see chart for details).   Patient presented to the emergency department today because of concerns for right-sided pain.  Whilst she was sent for concerns for appendicitis work-up is most consistent with pyelonephritis.  Discussed this with the patient.  Will give dose of IV antibiotics here.  Will plan on discharging.  Additionally discussed with patient CT finding of ovarian cyst and necessity for repeat imaging in 1 year.   ____________________________________________   FINAL CLINICAL IMPRESSION(S) / ED DIAGNOSES  Final diagnoses:  Pyelonephritis     Note: This dictation was prepared with Dragon dictation. Any transcriptional errors that result from this process are unintentional     Nance Pear, MD 01/08/18 2048

## 2018-01-08 NOTE — Discharge Instructions (Addendum)
As discussed an ovarian cyst was seen on the CT scan. Please have this re-imaged in roughly 1 year. Please seek medical attention for any high fevers, chest pain, shortness of breath, change in behavior, persistent vomiting, bloody stool or any other new or concerning symptoms.

## 2018-01-08 NOTE — ED Notes (Signed)
Pt ambulatory to POV, VSS, NAD. Discharge instructions, RX and follow up discussed. All questions and concerns addressed.

## 2018-01-08 NOTE — ED Triage Notes (Signed)
Patient presents to the ED with right lower quadrant abdominal pain that began around 2:10pm.  Patient states when pain started it felt like a "hot knife" sticking in her abdomen.  Patient reports area is tender.  Patient denies vomiting and diarrhea.  Patient is in no obvious distress at this time.

## 2018-01-08 NOTE — ED Notes (Signed)
Pt to CT

## 2018-01-08 NOTE — ED Notes (Signed)
Pt returned from CT °

## 2018-01-08 NOTE — ED Notes (Signed)
Dr. Archie Balboa at bedside pain to RUQ pain denies any other symptom reports "feels like a throbbing pain " Dialysis T/Th/Sat bad odor to urine, denies any fever, denies any injuries

## 2018-01-10 LAB — URINE CULTURE

## 2018-01-15 ENCOUNTER — Telehealth: Payer: Self-pay | Admitting: Obstetrics and Gynecology

## 2018-01-15 DIAGNOSIS — N83202 Unspecified ovarian cyst, left side: Secondary | ICD-10-CM | POA: Insufficient documentation

## 2018-01-15 NOTE — Telephone Encounter (Signed)
Patient called just to leave an fyi. She was seen in the ER and just want to let Dr D know. Thanks

## 2018-01-15 NOTE — Telephone Encounter (Signed)
Sent to [facility]

## 2018-01-18 NOTE — Telephone Encounter (Signed)
Pt is aware.   She also wanted to let you know she has a kidney stone. She is following up with [facility] this Wednesday at 3pm.

## 2018-01-20 ENCOUNTER — Ambulatory Visit (INDEPENDENT_AMBULATORY_CARE_PROVIDER_SITE_OTHER): Payer: Medicare Other | Admitting: Urology

## 2018-01-20 VITALS — BP 136/74 | HR 72 | Ht 62.0 in | Wt 215.2 lb

## 2018-01-20 DIAGNOSIS — R109 Unspecified abdominal pain: Secondary | ICD-10-CM

## 2018-01-20 DIAGNOSIS — N200 Calculus of kidney: Secondary | ICD-10-CM

## 2018-01-20 LAB — URINALYSIS, COMPLETE
Bilirubin, UA: NEGATIVE
Glucose, UA: NEGATIVE
Ketones, UA: NEGATIVE
Nitrite, UA: NEGATIVE
Specific Gravity, UA: 1.02 (ref 1.005–1.030)
Urobilinogen, Ur: 0.2 mg/dL (ref 0.2–1.0)
pH, UA: 5.5 (ref 5.0–7.5)

## 2018-01-20 LAB — MICROSCOPIC EXAMINATION: RBC, UA: >30 /hpf — ABNORMAL HIGH (ref 0–2)

## 2018-01-20 NOTE — Progress Notes (Signed)
01/20/2018 8:24 PM   Nina Johnston November 21, 1960 [id number]  Referring provider: Idelle Crouch, MD [place] [facility] [place], [place] [address]  Chief Complaint  Patient presents with  . Nephrolithiasis    HPI: Patient is a 57 year old Caucasian female with ESRD on dialysis who was referred by [facility] ED for nephrolithiasis/pyelonephritis.    She presented to the ED on January 08, 2018 with a sudden onset of right-sided flank pain.  She was fearful of appendicitis.  Contrast CT revealed multiple bilateral renal calculi. The largest is a small staghorn type calculus in the lower pole of the right kidney, measuring 1.8 cm in maximum diameter. There is also a small right renal cyst. No bladder or ureteral calculi and no hydronephrosis. Normal appearing adrenal glands.   Labs in the ED:  UA was positive for 11-20 RBCs and 21-50 WBCs.  Her serum creatinine was 5.48.  Her WBC count was 12.2.   Urine culture grew out multiple species.  Meds given in the ED: Rocephin IM and Keflex  Current NSAID/anticoagulation:   ASA 81 mg   Today, she is still having right-sided pain.  She states that extends up into her shoulder and down to her leg.  She states it is made worse by laying on her right side.  She states when she lays on her left side she does not have the pain.  She states she does not have pain during the day, but she states when she gets up in the night to void the pain occurs while she is sitting on the toilet.    She has baseline frequency, urgency, nocturia and intermittency.  Patient denies any gross hematuria, dysuria or suprapubic/flank pain.  Patient denies any fevers, chills, nausea or vomiting.  CATH UA was positive for 11-30 WBCs, greater than 30 RBCs and many bacteria.     PMH: Past Medical History:  Diagnosis Date  . A-V fistula ([place])   . Acute bacterial sinusitis 04/19/2014  . Adaptive colitis   . Anginal pain ([place])   . Anxiety   . Anxiety  and depression   . Arthritis   . Asthma   . 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 Benign essential HTN 12/26/2014  . Bipolar disorder ([place])   . Bronchitis   . Carotid artery narrowing 12/22/2014  . Cervical prolapse   . CHF (congestive heart failure) ([place])   . CKD (chronic kidney disease)   . Concussion 01-14-14   Fall   . COPD (chronic obstructive pulmonary disease) ([place])   . Coronary artery disease   . Depression   . Diabetes mellitus without complication ([place])   . Dialysis patient ([place])   . Diplopia   . Dupuytren's contracture of foot   . Essential (primary) hypertension 02/12/2015  . Family history of thyroid problem   . GERD (gastroesophageal reflux disease)   . Gout   . Heart disease    enlarged because of COPD  . Heart murmur   . Hepatomegaly   . HLD (hyperlipidemia)   . Hyperactive airway disease   . Hyperkalemia   . Hypertension   . Hyperthyroidism   . Irritable colon   . Morbid obesity ([place])   . Multiple thyroid nodules   . OAB (overactive bladder)   . Pernicious anemia   . Post menopausal syndrome   . Post-concussion syndrome 03/08/2014  . Post-traumatic  brain syndrome   . Prolapsed uterus   . PSVT (paroxysmal supraventricular tachycardia) ([place])   . Renal calculus   . Renal disease    w/ GFR 29-may be due to diabetes  . Seizures ([place])    last seizure 2012. has had spells since with no knowledge. last was 1 month ago  . Sleep apnea   . 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 Transient alteration of awareness 11/10/2012  . Urinary incontinence with continuous leakage 03/08/2014    Surgical History: Past Surgical History:  Procedure Laterality Date  . A/V FISTULAGRAM Left 07/21/2016   Procedure: A/V Fistulagram;  Surgeon: Algernon Huxley, MD;  Location: [place] [facility];  Service: Cardiovascular;  Laterality: Left;  . A/V FISTULAGRAM Left  12/24/2016   Procedure: A/V Fistulagram;  Surgeon: Algernon Huxley, MD;  Location: [place] [facility];  Service: Cardiovascular;  Laterality: Left;  . A/V FISTULAGRAM Left 06/29/2017   Procedure: A/V FISTULAGRAM;  Surgeon: Algernon Huxley, MD;  Location: [place] [facility];  Service: Cardiovascular;  Laterality: Left;  . A/V SHUNT INTERVENTION N/A 07/21/2016   Procedure: A/V Shunt Intervention;  Surgeon: Algernon Huxley, MD;  Location: [place] [facility];  Service: Cardiovascular;  Laterality: N/A;  . A/V SHUNT INTERVENTION N/A 12/24/2016   Procedure: A/V SHUNT INTERVENTION;  Surgeon: Algernon Huxley, MD;  Location: [place] [facility];  Service: Cardiovascular;  Laterality: N/A;  . AVF    . CHOLECYSTECTOMY    . COLONOSCOPY    . COLONOSCOPY WITH PROPOFOL N/A 03/18/2017   Procedure: COLONOSCOPY WITH PROPOFOL;  Surgeon: Manya Silvas, MD;  [facility];  Service: Endoscopy;  Laterality: N/A;  . ENDOMETRIAL BIOPSY     ablation, uterine  . ESOPHAGOGASTRODUODENOSCOPY (EGD) WITH PROPOFOL N/A 03/18/2017   Procedure: ESOPHAGOGASTRODUODENOSCOPY (EGD) WITH PROPOFOL;  Surgeon: Manya Silvas, MD;  Location: [facility] ENDOSCOPY;  Service: Endoscopy;  Laterality: N/A;  . HERNIA REPAIR    . HERNIA REPAIR    . PERIPHERAL VASCULAR CATHETERIZATION N/A 08/23/2015   Procedure: Dialysis/Perma Catheter Insertion;  Surgeon: Algernon Huxley, MD;  Location: [place] [facility];  Service: Cardiovascular;  Laterality: N/A;  . PERIPHERAL VASCULAR CATHETERIZATION N/A 11/09/2015   Procedure: Dialysis/Perma Catheter Removal;  Surgeon: Katha Cabal, MD;  Location: [place] [facility];  Service: Cardiovascular;  Laterality: N/A;  . PERIPHERAL VASCULAR CATHETERIZATION Left 04/23/2016   Procedure: A/V Fistulagram;  Surgeon: Algernon Huxley, MD;  Location: [place] [facility];  Service: Cardiovascular;  Laterality: Left;  . PORT A CATH REVISION      Home Medications:  Allergies as of 01/20/2018      Reactions   Cinnamon  Other (See Comments)   Raises blood pressure   Procrit [epoetin Alfa] Other (See Comments)   Seizures, grand mal   Ace Inhibitors Cough   Azithromycin Other (See Comments)   seizures   Iron Other (See Comments)   Can not take in IV Form - causes seizures    Neomycin-bacitracin Zn-polymyx Other (See Comments)   Unknown   Sulfa Antibiotics Other (See Comments)   Due to risks for seizures.      Medication List        Accurate as of 01/20/18 11:59 PM. Always use your most recent med list.          ADVAIR DISKUS 250-50 MCG/DOSE Aepb Generic drug:  Fluticasone-Salmeterol Inhale 1 puff into the lungs  2 (two) times daily.   ALIGN 4 MG Caps Take 4 mg by mouth daily.   ALLEGRA 180 MG tablet Generic drug:  fexofenadine Take 180 mg by mouth daily.   amLODipine 5 MG tablet Commonly known as:  NORVASC Take 5 mg by mouth See admin instructions. Take 5 mg by mouth daily except do not take on Tuesday, Thursday and Saturday.   aspirin 81 MG tablet Take 81 mg by mouth daily.   AURYXIA 1 GM 210 MG(Fe) tablet Generic drug:  ferric citrate Take 1 tablet by mouth 2 (two) times daily.   b complex vitamins tablet Take 1 tablet by mouth at bedtime.   benzonatate 200 MG capsule Commonly known as:  TESSALON Take by mouth.   cephALEXin 500 MG capsule Commonly known as:  KEFLEX Take 1 capsule (500 mg total) by mouth 3 (three) times daily.   clonazePAM 1 MG tablet Commonly known as:  KLONOPIN TAKE ONE TABLET BY MOUTH AT BEDTIME   cyanocobalamin 1000 MCG/ML injection Commonly known as:  (VITAMIN B-12) Inject 1,000 mcg into the muscle every 14 (fourteen) days.   DIALYVITE TABLET Tabs Take 1 tablet by mouth daily.   epoetin alfa 3000 UNIT/ML injection Commonly known as:  EPOGEN,PROCRIT Inject 3,000 Units into the vein every Monday, Wednesday, and Friday.   ethosuximide 250 MG/5ML solution Commonly known as:  ZARONTIN TAKE 5ML BY MOUTH 2 TIMES DAILY   famotidine 40 MG  tablet Commonly known as:  PEPCID Take by mouth.   folic acid-vitamin b complex-vitamin c-selenium-zinc 3 MG Tabs tablet Take 1 tablet by mouth daily.   furosemide 20 MG tablet Commonly known as:  LASIX Take 30 mg by mouth 2 (two) times daily.   heparin 1000 unit/ml Soln injection Inject into the peritoneum as needed.   hydrALAZINE 100 MG tablet Commonly known as:  APRESOLINE Take 100 mg by mouth See admin instructions. Take 100 mg by mouth twice daily on non-dialysis days. Take 100 mg by mouth at bedtime on dialysis days Tuesday, Thursday and Saturday   insulin lispro 100 UNIT/ML injection Commonly known as:  HUMALOG Inject 10-15 Units into the skin 3 (three) times daily with meals. Per sliding scale   LAMICTAL 200 MG tablet Generic drug:  lamoTRIgine Take 1.5 tablets (300 mg total) by mouth 2 (two) times daily.   LANTUS 100 UNIT/ML injection Generic drug:  insulin glargine Inject 22 Units into the skin at bedtime.   metoprolol succinate 50 MG 24 hr tablet Commonly known as:  TOPROL-XL Take 1 tablet (50 mg total) by mouth 2 (two) times daily. Take with or immediately following a meal.   NASACORT ALLERGY 24HR 55 MCG/ACT Aero nasal inhaler Generic drug:  triamcinolone Place 2 sprays into the nose at bedtime.   nystatin-triamcinolone cream Commonly known as:  MYCOLOG II Apply 1 application topically 2 (two) times daily.   pantoprazole 40 MG tablet Commonly known as:  PROTONIX Take 40 mg by mouth every morning.   SINGULAIR 10 MG tablet Generic drug:  montelukast Take 10 mg by mouth at bedtime.   SYNTHROID 150 MCG tablet Generic drug:  levothyroxine Take 150 mcg by mouth daily before breakfast.   TEGRETOL-XR 100 MG 12 hr tablet Generic drug:  carbamazepine Take 1 tablet bid for 2 weeks and than 1 in Pm for the rest of the bottle.   ULORIC 80 MG Tabs Generic drug:  Febuxostat Take 80 mg by mouth daily.   UNIFINE PENTIPS 32G X 4 MM Misc Generic  drug:  Insulin  Pen Needle USE UP TO 5 TIMES DAILY AS DIRECTED   Vitamin D3 1000 units Caps Take by mouth.       Allergies:  Allergies  Allergen Reactions  . Cinnamon Other (See Comments)    Raises blood pressure  . Procrit [Epoetin Alfa] Other (See Comments)    Seizures, grand mal  . Ace Inhibitors Cough  . Azithromycin Other (See Comments)    seizures  . Iron Other (See Comments)    Can not take in IV Form - causes seizures   . Neomycin-Bacitracin Zn-Polymyx Other (See Comments)    Unknown  . Sulfa Antibiotics Other (See Comments)    Due to risks for seizures.    Family History: Family History  Problem Relation Age of Onset  . Diabetes Mother   . Lumbar disc disease Mother   . Migraines Mother   . Hypertension Mother   . Cancer - Prostate Father        mild  . Diabetes Father   . Hypertension Father   . Breast cancer Neg Hx   . Ovarian cancer Neg Hx   . Colon cancer Neg Hx   . Heart disease Neg Hx   . Kidney cancer Neg Hx   . Bladder Cancer Neg Hx     Social History:  reports that she has never smoked. She has never used smokeless tobacco. She reports that she does not drink alcohol or use drugs.  ROS: UROLOGY Frequent Urination?: Yes Hard to postpone urination?: Yes Burning/pain with urination?: No Get up at night to urinate?: Yes Leakage of urine?: No Urine stream starts and stops?: Yes Trouble starting stream?: No Do you have to strain to urinate?: No Blood in urine?: No Urinary tract infection?: No Sexually transmitted disease?: No Injury to kidneys or bladder?: No Painful intercourse?: No Weak stream?: No Currently pregnant?: No Vaginal bleeding?: No Last menstrual period?: n  Gastrointestinal Nausea?: No Vomiting?: No Indigestion/heartburn?: Yes Diarrhea?: Yes Constipation?: No  Constitutional Fever: No Night sweats?: No Weight loss?: No Fatigue?: No  Skin Skin rash/lesions?: No Itching?: No  Eyes Blurred vision?: No Double vision?:  No  Ears/Nose/Throat Sore throat?: No Sinus problems?: No  Hematologic/Lymphatic Swollen glands?: No Easy bruising?: No  Cardiovascular Leg swelling?: No Chest pain?: No  Respiratory Cough?: Yes Shortness of breath?: Yes  Endocrine Excessive thirst?: No  Musculoskeletal Back pain?: No Joint pain?: No  Neurological Headaches?: No Dizziness?: No  Psychologic Depression?: No Anxiety?: No  Physical Exam: BP 136/74 (BP Location: Right Leg, Patient Position: Sitting, Cuff Size: Normal)   Pulse 72   Ht 5\' 2"  (1.575 m)   Wt 215 lb 3.2 oz (97.6 kg)   BMI 39.36 kg/m   Constitutional:  Well nourished. Alert and oriented, No acute distress. HEENT: [place] AT, moist mucus membranes.  Trachea midline, no masses. Cardiovascular: No clubbing, cyanosis, or edema. Respiratory: Normal respiratory effort, no increased work of breathing. GI: Abdomen is soft, non tender, non distended, no abdominal masses. Liver and spleen not palpable.  No hernias appreciated.  Stool sample for occult testing is not indicated.   GU: No CVA tenderness.  No bladder fullness or masses.   Skin: No rashes, bruises or suspicious lesions. Lymph: No cervical or inguinal adenopathy. Neurologic: Grossly intact, no focal deficits, moving all 4 extremities. Psychiatric: Normal mood and affect.  Laboratory Data: Lab Results  Component Value Date   WBC 12.2 (H) 01/08/2018   HGB 10.3 (L) 01/08/2018   HCT  30.8 (L) 01/08/2018   MCV 100.9 (H) 01/08/2018   PLT 209 01/08/2018    Lab Results  Component Value Date   CREATININE 5.48 (H) 01/08/2018    No results found for: PSA  No results found for: TESTOSTERONE  No results found for: HGBA1C  Lab Results  Component Value Date   TSH 2.373 04/10/2016    No results found for: CHOL, HDL, CHOLHDL, VLDL, LDLCALC  Lab Results  Component Value Date   AST 15 01/08/2018   Lab Results  Component Value Date   ALT 14 01/08/2018   No components found for:  ALKALINEPHOPHATASE No components found for: BILIRUBINTOTAL  No results found for: ESTRADIOL  Urinalysis CATH UA was positive for 11-30 WBCs, greater than 30 RBCs and many bacteria.  See Epic.    I have reviewed the labs.   Pertinent Imaging: CLINICAL DATA:  Right lower quadrant abdominal pain since 2:10 p.m. today. Associated tenderness.  EXAM: CT ABDOMEN AND PELVIS WITH CONTRAST  TECHNIQUE: Multidetector CT imaging of the abdomen and pelvis was performed using the standard protocol following bolus administration of intravenous contrast.  CONTRAST:  131mL ISOVUE-300 IOPAMIDOL (ISOVUE-300) INJECTION 61%  COMPARISON:  12/14/2015.  FINDINGS: Lower chest: Minimal bibasilar atelectasis/scarring. Enlarged heart.  Hepatobiliary: No focal liver abnormality is seen. Status post cholecystectomy. No biliary dilatation.  Pancreas: Unremarkable. No pancreatic ductal dilatation or surrounding inflammatory changes.  Spleen: Normal in size without focal abnormality.  Adrenals/Urinary Tract: Multiple bilateral renal calculi. The largest is a small staghorn type calculus in the lower pole of the right kidney, measuring 1.8 cm in maximum diameter. There is also a small right renal cyst. No bladder or ureteral calculi and no hydronephrosis. Normal appearing adrenal glands.  Stomach/Bowel: Multiple colonic diverticula without evidence of diverticulitis. These are primarily in the sigmoid and descending colon. Normal appearing appendix, stomach and small bowel.  Vascular/Lymphatic: Enlarged heart. Mild atheromatous arterial calcifications without aneurysm. No enlarged lymph nodes.  Reproductive: 1.9 cm simple appearing left ovarian cyst, with a slight increase in size since 12/14/2015, measuring 1.4 cm in maximum diameter at that time. There were 2 left ovarian cysts on 01/18/2010, 1 measuring 1.7 cm and the other measuring 1.5 cm.  Other: Interval decrease in size of a  fat containing ventral hernia to the left of midline. The hernia defect is very small and the amount of herniated fat measures 3.8 cm in maximum diameter. This contains an interval small bilobed fluid collection measuring 3.5 cm in length on sagittal image number 77. The hernia defect was previously larger, compatible with interval repair.  Musculoskeletal: Lumbar and lower thoracic spine degenerative changes.  IMPRESSION: 1. No acute abnormality.  Specifically, no evidence of appendicitis. 2. Extensive colonic diverticulosis without evidence of diverticulitis. 3. Multiple bilateral renal calculi, including a 1.8 cm staghorn type calculus in the lower pole of the right kidney. 4. 1.9 cm simple appearing left ovarian cyst with a slight increase in size since 12/14/2015. This is almost certainly benign, but follow up ultrasound is recommended in 1 year according to the Society of Radiologists in Ultrasound 2010 Consensus Conference Statement Gordy Levan ET al. Management of Asymptomatic Ovarian and Other Adnexal Cysts Imaged at [place]: Society of Radiologists in [place] Statement 2010. Radiology 256 (Sept 2010): 943-954.). 5. Small to moderate-sized ventral hernia containing fat and a bilobed fluid collection to the left of midline at the site of a larger ventral hernia containing a larger amount of fat previously. The fluid most likely  represents a seroma or trapped peritoneal fluid. 6. Cardiomegaly.   Electronically Signed   By: Claudie Revering M.D.   On: 01/08/2018 20:07 I have independently reviewed the films with patient and parents.    Assessment & Plan:    1. Right sided pain I explained to the patient that I was not convinced that her stones were the cause of her right-sided pain as she had bilateral nephrolithiasis and was not feeling any pain on her left side.  I also explained that renal colic is independent of movement and body position and that  most people are not able to find a position that would stop the pain.    2.  Bilateral nephrolithiasis I reviewed the images of the CT scan with both her and her parents and explained that treating her stones would be very difficult and dangerous due to her health issues and her anatomy.  I suggested that she seek evaluation for other possible causes of her right sided pain as it sounded more musculoskeletal to me in nature.  She stated that the head nurse at her dialysis center told her that she needed to get her stones removed due to her pain and possibility of infection.  She is insistent on wanting her right stones addressed.  I explained that she would need to be seen by 1 of our physicians and/or likely referred on to an academic center for further discussion.  3. ESRD Due to DM and HTN Currently on dialysis   Return for return for appointment with Dr. Diamantina Providence.  These notes generated with voice recognition software. I apologize for typographical errors.  Zara Council, PA-C  [facility] [address]  [place], [place] [address] [phone]

## 2018-01-21 ENCOUNTER — Telehealth: Payer: Self-pay | Admitting: Urology

## 2018-01-21 NOTE — Telephone Encounter (Signed)
Patient;s mother notified and apt scheduled

## 2018-01-21 NOTE — Telephone Encounter (Signed)
Please let the Bogdan's know that Dr. Diamantina Providence would like Nina Johnston to come in for a visit so that he can speak to her about her kidney stones.

## 2018-01-23 LAB — CULTURE, URINE COMPREHENSIVE

## 2018-01-24 ENCOUNTER — Encounter: Payer: Self-pay | Admitting: Urology

## 2018-01-25 ENCOUNTER — Telehealth: Payer: Self-pay

## 2018-01-25 NOTE — Telephone Encounter (Signed)
-----   Message from Nori Riis, PA-C sent at 01/24/2018  5:24 PM EDT ----- Please let Nina Johnston know that her urine culture was negative.  She does not have an infection.

## 2018-01-25 NOTE — Telephone Encounter (Signed)
Patient notified, she would like to reschedule apt please call to change thanks

## 2018-01-29 ENCOUNTER — Ambulatory Visit
Admission: RE | Admit: 2018-01-29 | Discharge: 2018-01-29 | Disposition: A | Discharge status: Home / Self Care | Payer: Medicare Other | Source: Ambulatory Visit | Attending: Internal Medicine | Admitting: Internal Medicine

## 2018-02-05 ENCOUNTER — Ambulatory Visit: Payer: Self-pay | Admitting: Urology

## 2018-02-12 ENCOUNTER — Encounter: Payer: Self-pay | Admitting: Urology

## 2018-02-12 ENCOUNTER — Other Ambulatory Visit: Payer: Self-pay

## 2018-02-12 ENCOUNTER — Other Ambulatory Visit
Admission: RE | Admit: 2018-02-12 | Discharge: 2018-02-12 | Disposition: A | Discharge status: Home / Self Care | Payer: Medicare Other | Source: Ambulatory Visit | Attending: Urology | Admitting: Urology

## 2018-02-12 ENCOUNTER — Ambulatory Visit (INDEPENDENT_AMBULATORY_CARE_PROVIDER_SITE_OTHER): Payer: Medicare Other | Admitting: Urology

## 2018-02-12 ENCOUNTER — Ambulatory Visit (INDEPENDENT_AMBULATORY_CARE_PROVIDER_SITE_OTHER): Payer: Medicare Other | Admitting: Vascular Surgery

## 2018-02-12 ENCOUNTER — Encounter (INDEPENDENT_AMBULATORY_CARE_PROVIDER_SITE_OTHER): Payer: Medicare Other

## 2018-02-12 VITALS — BP 147/66 | HR 68 | Ht 62.0 in | Wt 214.0 lb

## 2018-02-12 DIAGNOSIS — R109 Unspecified abdominal pain: Secondary | ICD-10-CM

## 2018-02-12 DIAGNOSIS — N200 Calculus of kidney: Secondary | ICD-10-CM | POA: Diagnosis not present

## 2018-02-12 LAB — URINALYSIS, COMPLETE (UACMP) WITH MICROSCOPIC
Bilirubin Urine: NEGATIVE
Glucose, UA: NEGATIVE mg/dL
Ketones, ur: NEGATIVE mg/dL
Nitrite: NEGATIVE
Protein, ur: 100 mg/dL — AB
Specific Gravity, Urine: 1.02 (ref 1.005–1.030)
pH: 7 (ref 5.0–8.0)

## 2018-02-12 NOTE — Progress Notes (Signed)
02/12/2018 2:06 PM   Nina Johnston 10-21-1960 [id number]  Referring provider: Idelle Crouch, MD [facility] [place], [place] [address]  Chief Complaint  Patient presents with  . Nephrolithiasis    HPI: 57 year old female who presents today to discuss management of her bilateral nonobstructing stones.  She has multiple medical comorbidities including history of end-stage renal disease on hemodialysis.  She does continue to make urine.  Her oral intake of fluid is limited due to being on dialysis.  She has been complaining of right-sided pain extending down the back of her leg.  This is exacerbated with movement and positional changes.  No history of recurrent UTIs or pyelonephritis.  She does have multiple kidney stones, largest of which measuring 1.8 cm within the right kidney.  These are new since CT scan from 2019.  There are discovered incidentally.  No previous history of kidney stones.  No previous stone procedures.  No gross hematuria.   PMH: Past Medical History:  Diagnosis Date  . A-V fistula ([place])   . Acute bacterial sinusitis 04/19/2014  . Adaptive colitis   . Anginal pain ([place])   . Anxiety   . Anxiety and depression   . Arthritis   . Asthma   . 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 Benign essential HTN 12/26/2014  . Bipolar disorder ([place])   . Bronchitis   . Carotid artery narrowing 12/22/2014  . Cervical prolapse   . CHF (congestive heart failure) ([place])   . CKD (chronic kidney disease)   . Concussion 01-14-14   Fall   . COPD (chronic obstructive pulmonary disease) ([place])   . Coronary artery disease   . Depression   . Diabetes mellitus without complication ([place])   . Dialysis patient ([place])   . Diplopia   . Dupuytren's contracture of foot   . Essential (primary) hypertension 02/12/2015  . Family history of thyroid problem   . GERD  (gastroesophageal reflux disease)   . Gout   . Heart disease    enlarged because of COPD  . Heart murmur   . Hepatomegaly   . HLD (hyperlipidemia)   . Hyperactive airway disease   . Hyperkalemia   . Hypertension   . Hyperthyroidism   . Irritable colon   . Morbid obesity ([place])   . Multiple thyroid nodules   . OAB (overactive bladder)   . Pernicious anemia   . Post menopausal syndrome   . Post-concussion syndrome 03/08/2014  . Post-traumatic brain syndrome   . Prolapsed uterus   . PSVT (paroxysmal supraventricular tachycardia) ([place])   . Renal calculus   . Renal disease    w/ GFR 29-may be due to diabetes  . Seizures ([place])    last seizure 2012. has had spells since with no knowledge. last was 1 month ago  . Sleep apnea   . 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 Transient alteration of awareness 11/10/2012  . Urinary incontinence with continuous leakage 03/08/2014    Surgical History: Past Surgical History:  Procedure Laterality Date  . A/V FISTULAGRAM Left 07/21/2016   Procedure: A/V Fistulagram;  Surgeon: Algernon Huxley, MD;  Location: [facility];  Service: Cardiovascular;  Laterality: Left;  . A/V FISTULAGRAM Left 12/24/2016   Procedure: A/V Fistulagram;  Surgeon: Algernon Huxley, MD;  Location: [facility];  Service: Cardiovascular;  Laterality: Left;  . A/V FISTULAGRAM Left 06/29/2017   Procedure: A/V FISTULAGRAM;  Surgeon: Algernon Huxley, MD;  Location: [facility];  Service: Cardiovascular;  Laterality: Left;  . A/V SHUNT INTERVENTION N/A 07/21/2016   Procedure: A/V Shunt Intervention;  Surgeon: Algernon Huxley, MD;  Location: [facility];  Service: Cardiovascular;  Laterality: N/A;  . A/V SHUNT INTERVENTION N/A 12/24/2016   Procedure: A/V SHUNT INTERVENTION;  Surgeon: Algernon Huxley, MD;  Location: [facility];  Service: Cardiovascular;  Laterality: N/A;  . AVF    . CHOLECYSTECTOMY    . COLONOSCOPY      . COLONOSCOPY WITH PROPOFOL N/A 03/18/2017   Procedure: COLONOSCOPY WITH PROPOFOL;  Surgeon: Manya Silvas, MD;  Location: [facility] ENDOSCOPY;  Service: Endoscopy;  Laterality: N/A;  . ENDOMETRIAL BIOPSY     ablation, uterine  . ESOPHAGOGASTRODUODENOSCOPY (EGD) WITH PROPOFOL N/A 03/18/2017   Procedure: ESOPHAGOGASTRODUODENOSCOPY (EGD) WITH PROPOFOL;  Surgeon: Manya Silvas, MD;  Location: [facility] ENDOSCOPY;  Service: Endoscopy;  Laterality: N/A;  . HERNIA REPAIR    . HERNIA REPAIR    . PERIPHERAL VASCULAR CATHETERIZATION N/A 08/23/2015   Procedure: Dialysis/Perma Catheter Insertion;  Surgeon: Algernon Huxley, MD;  Location: [facility];  Service: Cardiovascular;  Laterality: N/A;  . PERIPHERAL VASCULAR CATHETERIZATION N/A 11/09/2015   Procedure: Dialysis/Perma Catheter Removal;  Surgeon: Katha Cabal, MD;  Location: [place] [facility];  Service: Cardiovascular;  Laterality: N/A;  . PERIPHERAL VASCULAR CATHETERIZATION Left 04/23/2016   Procedure: A/V Fistulagram;  Surgeon: Algernon Huxley, MD;  Location: [place] [facility];  Service: Cardiovascular;  Laterality: Left;  . PORT A CATH REVISION      Home Medications:  Allergies as of 02/12/2018      Reactions   Cinnamon Other (See Comments)   Raises blood pressure   Procrit [epoetin Alfa] Other (See Comments)   Seizures, grand mal   Ace Inhibitors Cough   Azithromycin Other (See Comments)   seizures   Iron Other (See Comments)   Can not take in IV Form - causes seizures    Neomycin-bacitracin Zn-polymyx Other (See Comments)   Unknown   Sulfa Antibiotics Other (See Comments)   Due to risks for seizures.      Medication List        Accurate as of 02/12/18 11:59 PM. Always use your most recent med list.          ADVAIR DISKUS 250-50 MCG/DOSE Aepb Generic drug:  Fluticasone-Salmeterol Inhale 1 puff into the lungs 2 (two) times daily.   ALIGN 4 MG Caps Take 4 mg by mouth daily.   ALLEGRA 180 MG tablet Generic drug:   fexofenadine Take 180 mg by mouth daily.   amLODipine 5 MG tablet Commonly known as:  NORVASC Take 5 mg by mouth See admin instructions. Take 5 mg by mouth daily except do not take on Tuesday, Thursday and Saturday.   aspirin 81 MG tablet Take 81 mg by mouth daily.   AURYXIA 1 GM 210 MG(Fe) tablet Generic drug:  ferric citrate Take 1 tablet by mouth 2 (two) times daily.   b complex vitamins tablet Take 1 tablet by mouth at bedtime.   benzonatate 200 MG capsule Commonly known as:  TESSALON Take by mouth.   cephALEXin 500 MG capsule Commonly known as:  KEFLEX Take 1 capsule (500 mg total) by mouth 3 (three) times daily.   clonazePAM 1  MG tablet Commonly known as:  KLONOPIN TAKE ONE TABLET BY MOUTH AT BEDTIME   cyanocobalamin 1000 MCG/ML injection Commonly known as:  (VITAMIN B-12) Inject 1,000 mcg into the muscle every 14 (fourteen) days.   DIALYVITE TABLET Tabs Take 1 tablet by mouth daily.   epoetin alfa 3000 UNIT/ML injection Commonly known as:  EPOGEN,PROCRIT Inject 3,000 Units into the vein every Monday, Wednesday, and Friday.   ethosuximide 250 MG/5ML solution Commonly known as:  ZARONTIN TAKE 5ML BY MOUTH 2 TIMES DAILY   famotidine 40 MG tablet Commonly known as:  PEPCID Take by mouth.   folic acid-vitamin b complex-vitamin c-selenium-zinc 3 MG Tabs tablet Take 1 tablet by mouth daily.   furosemide 20 MG tablet Commonly known as:  LASIX Take 30 mg by mouth 2 (two) times daily.   heparin 1000 unit/ml Soln injection Inject into the peritoneum as needed.   hydrALAZINE 100 MG tablet Commonly known as:  APRESOLINE Take 100 mg by mouth See admin instructions. Take 100 mg by mouth twice daily on non-dialysis days. Take 100 mg by mouth at bedtime on dialysis days Tuesday, Thursday and Saturday   insulin lispro 100 UNIT/ML injection Commonly known as:  HUMALOG Inject 10-15 Units into the skin 3 (three) times daily with meals. Per sliding scale   LAMICTAL  200 MG tablet Generic drug:  lamoTRIgine Take 1.5 tablets (300 mg total) by mouth 2 (two) times daily.   LANTUS 100 UNIT/ML injection Generic drug:  insulin glargine Inject 22 Units into the skin at bedtime.   metoprolol succinate 50 MG 24 hr tablet Commonly known as:  TOPROL-XL Take 1 tablet (50 mg total) by mouth 2 (two) times daily. Take with or immediately following a meal.   NASACORT ALLERGY 24HR 55 MCG/ACT Aero nasal inhaler Generic drug:  triamcinolone Place 2 sprays into the nose at bedtime.   nystatin-triamcinolone cream Commonly known as:  MYCOLOG II Apply 1 application topically 2 (two) times daily.   pantoprazole 40 MG tablet Commonly known as:  PROTONIX Take 40 mg by mouth every morning.   SINGULAIR 10 MG tablet Generic drug:  montelukast Take 10 mg by mouth at bedtime.   SYNTHROID 150 MCG tablet Generic drug:  levothyroxine Take 150 mcg by mouth daily before breakfast.   TEGRETOL-XR 100 MG 12 hr tablet Generic drug:  carbamazepine Take 1 tablet bid for 2 weeks and than 1 in Pm for the rest of the bottle.   ULORIC 80 MG Tabs Generic drug:  Febuxostat Take 80 mg by mouth daily.   UNIFINE PENTIPS 32G X 4 MM Misc Generic drug:  Insulin Pen Needle USE UP TO 5 TIMES DAILY AS DIRECTED   Vitamin D3 1000 units Caps Take by mouth.       Allergies:  Allergies  Allergen Reactions  . Cinnamon Other (See Comments)    Raises blood pressure  . Procrit [Epoetin Alfa] Other (See Comments)    Seizures, grand mal  . Ace Inhibitors Cough  . Azithromycin Other (See Comments)    seizures  . Iron Other (See Comments)    Can not take in IV Form - causes seizures   . Neomycin-Bacitracin Zn-Polymyx Other (See Comments)    Unknown  . Sulfa Antibiotics Other (See Comments)    Due to risks for seizures.    Family History: Family History  Problem Relation Age of Onset  . Diabetes Mother   . Lumbar disc disease Mother   . Migraines Mother   . Hypertension  Mother   . Cancer - Prostate Father        mild  . Diabetes Father   . Hypertension Father   . Breast cancer Neg Hx   . Ovarian cancer Neg Hx   . Colon cancer Neg Hx   . Heart disease Neg Hx   . Kidney cancer Neg Hx   . Bladder Cancer Neg Hx     Social History:  reports that she has never smoked. She has never used smokeless tobacco. She reports that she does not drink alcohol or use drugs.  ROS: UROLOGY Frequent Urination?: Yes Hard to postpone urination?: Yes Burning/pain with urination?: No Get up at night to urinate?: Yes Leakage of urine?: No Urine stream starts and stops?: No Trouble starting stream?: No Do you have to strain to urinate?: No Blood in urine?: No Urinary tract infection?: No Sexually transmitted disease?: No Injury to kidneys or bladder?: No Painful intercourse?: No Weak stream?: Yes Currently pregnant?: No Vaginal bleeding?: No  Gastrointestinal Nausea?: No Vomiting?: No Indigestion/heartburn?: No Diarrhea?: No Constipation?: No  Constitutional Fever: No Night sweats?: No Weight loss?: No Fatigue?: No  Skin Skin rash/lesions?: No Itching?: No  Eyes Blurred vision?: No Double vision?: No  Ears/Nose/Throat Sore throat?: No Sinus problems?: No  Hematologic/Lymphatic Swollen glands?: No Easy bruising?: No  Cardiovascular Leg swelling?: No Chest pain?: No  Respiratory Cough?: No Shortness of breath?: No  Endocrine Excessive thirst?: No  Musculoskeletal Back pain?: No Joint pain?: No  Neurological Headaches?: Yes Dizziness?: No  Psychologic Depression?: No Anxiety?: No  Physical Exam: BP (!) 147/66   Pulse 68   Ht 5\' 2"  (1.575 m)   Wt 214 lb (97.1 kg)   BMI 39.14 kg/m   Constitutional:  Alert and oriented, No acute distress.  Company by family today. HEENT: [place] AT, moist mucus membranes.  Trachea midline, no masses. Cardiovascular: No clubbing, cyanosis, or edema. Respiratory: Normal respiratory effort, no  increased work of breathing. GI: Abdomen is soft, nontender, nondistended, no abdominal masses, overly obese. GU: No CVA tenderness Skin: No rashes, bruises or suspicious lesions. Neurologic: Grossly intact, no focal deficits, moving all 4 extremities. Psychiatric: Normal mood and affect.  Laboratory Data: Lab Results  Component Value Date   WBC 12.2 (H) 01/08/2018   HGB 10.3 (L) 01/08/2018   HCT 30.8 (L) 01/08/2018   MCV 100.9 (H) 01/08/2018   PLT 209 01/08/2018    Lab Results  Component Value Date   CREATININE 5.48 (H) 01/08/2018   Urinalysis Results for orders placed or performed during the hospital encounter of 02/12/18  Urinalysis, Complete w Microscopic  Result Value Ref Range   Color, Urine YELLOW YELLOW   APPearance CLOUDY (A) CLEAR   Specific Gravity, Urine 1.020 1.005 - 1.030   pH 7.0 5.0 - 8.0   Glucose, UA NEGATIVE NEGATIVE mg/dL   Hgb urine dipstick TRACE (A) NEGATIVE   Bilirubin Urine NEGATIVE NEGATIVE   Ketones, ur NEGATIVE NEGATIVE mg/dL   Protein, ur 100 (A) NEGATIVE mg/dL   Nitrite NEGATIVE NEGATIVE   Leukocytes, UA MODERATE (A) NEGATIVE   Squamous Epithelial / LPF 11-20 0 - 5   WBC, UA 21-50 0 - 5 WBC/hpf   RBC / HPF 0-5 0 - 5 RBC/hpf   Bacteria, UA FEW (A) NONE SEEN     Pertinent Imaging: CT abdomen pelvis with contrast from 01/08/2018 personally reviewed.  This is compared to previous CT scan abdomen pelvis from 2017.  It does appear that she has  multiple bilateral stones, these are layering in the dependent nature especially on the right which has more of a milk of calcium type appearance.  This kidney appears to be slightly malrotated.  No obstructing stones.  No surrounding inflammation.  Assessment & Plan:    1. Right sided abdominal pain We discussed today at length that the nature of her pain is unlikely related to stones, rather more likely musculoskeletal in the location of the pain, radiation, and nature exacerbated with activity We  discussed that pursuing surgical intervention for the stones will likely have no benefit or change in her expressed symptoms See discussion below  2. Bilateral nephrolithiasis Bilateral nonobstructing stones CT scan personally reviewed, 1.8 cm stone in fact appears to be layering of calcium in the dependent portion of the kidney, likely rather milk of calcium rather than true stone burden See discussion as above She is otherwise asymptomatic without infections, hematuria or flank pain related to the stones per se As such, I strongly recommended observation We did discuss risk factors for stone formation including end-stage renal disease with urinary stagnation Plan for KUB in 6 months Patient is agreeable this plan - Urinalysis, Complete w Microscopic; Future   Return in about 6 months (around 08/14/2018) for KUB ([place]).  Hollice Espy, MD  [facility] [address], [place], [place] [address] [phone]

## 2018-02-16 ENCOUNTER — Ambulatory Visit: Payer: Medicare Other | Admitting: Urology

## 2018-02-19 ENCOUNTER — Ambulatory Visit (INDEPENDENT_AMBULATORY_CARE_PROVIDER_SITE_OTHER): Payer: Medicare Other

## 2018-02-19 ENCOUNTER — Encounter (INDEPENDENT_AMBULATORY_CARE_PROVIDER_SITE_OTHER): Payer: Self-pay

## 2018-02-19 ENCOUNTER — Ambulatory Visit (INDEPENDENT_AMBULATORY_CARE_PROVIDER_SITE_OTHER): Payer: Medicare Other | Admitting: Nurse Practitioner

## 2018-02-19 ENCOUNTER — Encounter (INDEPENDENT_AMBULATORY_CARE_PROVIDER_SITE_OTHER): Payer: Self-pay | Admitting: Nurse Practitioner

## 2018-02-19 VITALS — BP 163/66 | HR 62 | Resp 16 | Ht 62.5 in | Wt 213.0 lb

## 2018-02-19 DIAGNOSIS — E1122 Type 2 diabetes mellitus with diabetic chronic kidney disease: Secondary | ICD-10-CM | POA: Diagnosis not present

## 2018-02-19 DIAGNOSIS — N186 End stage renal disease: Secondary | ICD-10-CM | POA: Diagnosis not present

## 2018-02-19 DIAGNOSIS — I120 Hypertensive chronic kidney disease with stage 5 chronic kidney disease or end stage renal disease: Secondary | ICD-10-CM | POA: Diagnosis not present

## 2018-02-19 DIAGNOSIS — Z992 Dependence on renal dialysis: Secondary | ICD-10-CM

## 2018-02-19 DIAGNOSIS — I10 Essential (primary) hypertension: Secondary | ICD-10-CM

## 2018-02-22 ENCOUNTER — Encounter (INDEPENDENT_AMBULATORY_CARE_PROVIDER_SITE_OTHER): Payer: Self-pay | Admitting: Nurse Practitioner

## 2018-02-22 NOTE — Progress Notes (Signed)
Subjective:    Patient ID: Nina Johnston, female    DOB: January 08, 1961, 57 y.o.   MRN: [id number] Chief Complaint  Patient presents with  . Follow-up    6 month HDA    HPI  Nina Johnston is a 57 y.o. female The patient returns to the office for follow up regarding problem with the dialysis access. Currently the patient is maintained via a left radio cephalic AVF.   The patient notes a significant increase in bleeding time after decannulation.  The patient has also been informed that there is increased recirculation.    The patient denies hand pain or other symptoms consistent with steal phenomena.  No significant arm swelling.  The patient denies redness or swelling at the access site. The patient denies fever or chills at home or while on dialysis.  The patient denies amaurosis fugax or recent TIA symptoms. There are no recent neurological changes noted. The patient denies claudication symptoms or rest pain symptoms. The patient denies history of DVT, PE or superficial thrombophlebitis. The patient denies recent episodes of angina or shortness of breath.   She underwent a hemodialysis access today, which revealed a flow volume of 429.  Elevated velocities at the AV fistula anastomosis of 720.  Compared to the study done on 08/21/2027 there is been a significant flow volume decrease.  The previous flow volume was 1302.   Past Medical History:  Diagnosis Date  . A-V fistula ([place])   . Acute bacterial sinusitis 04/19/2014  . Adaptive colitis   . Anginal pain ([place])   . Anxiety   . Anxiety and depression   . Arthritis   . Asthma   . 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 Benign essential HTN 12/26/2014  . Bipolar disorder ([place])   . Bronchitis   . Carotid artery narrowing 12/22/2014  . Cervical prolapse   . CHF (congestive heart failure) ([place])   . CKD (chronic kidney disease)   . Concussion 01-14-14   Fall   . COPD (chronic obstructive pulmonary disease) ([place])   . Coronary artery disease   . Depression   . Diabetes mellitus without complication ([place])   . Dialysis patient ([place])   . Diplopia   . Dupuytren's contracture of foot   . Essential (primary) hypertension 02/12/2015  . Family history of thyroid problem   . GERD (gastroesophageal reflux disease)   . Gout   . Heart disease    enlarged because of COPD  . Heart murmur   . Hepatomegaly   . HLD (hyperlipidemia)   . Hyperactive airway disease   . Hyperkalemia   . Hypertension   . Hyperthyroidism   . Irritable colon   . Morbid obesity ([place])   . Multiple thyroid nodules   . OAB (overactive bladder)   . Pernicious anemia   . Post menopausal syndrome   . Post-concussion syndrome 03/08/2014  . Post-traumatic brain syndrome   . Prolapsed uterus   . PSVT (paroxysmal supraventricular tachycardia) ([place])   . Renal calculus   . Renal disease    w/ GFR 29-may be due to diabetes  . Seizures ([place])    last seizure 2012. has had spells since with no knowledge. last was 1 month ago  . Sleep apnea   . 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 Transient alteration of awareness 11/10/2012  .  Urinary incontinence with continuous leakage 03/08/2014    Social History   Socioeconomic History  . Marital status: Single    Spouse name: Not on file  . Number of children: 0  . Years of education:  college  . Highest education level: Not on file  Occupational History  . 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 Financial resource strain: Not on file  . Food insecurity:    Worry: Not on file    Inability: Not on file  . Transportation needs:    Medical: Not on file    Non-medical: Not on file  Tobacco Use  . Smoking status: Never Smoker  . Smokeless tobacco: Never Used  Substance and Sexual Activity  . Alcohol use: No  . Drug use: No  . Sexual activity: Not Currently  Lifestyle  . Physical  activity:    Days per week: Not on file    Minutes per session: Not on file  . Stress: Not on file  Relationships  . Social connections:    Talks on phone: Not on file    Gets together: Not on file    Attends religious service: Not on file    Active member of club or organization: Not on file    Attends meetings of clubs or organizations: Not on file    Relationship status: Not on file  . Intimate partner violence:    Fear of current or ex partner: Not on file    Emotionally abused: Not on file    Physically abused: Not on file    Forced sexual activity: Not on file  Other Topics Concern  . Not on file  Social History Narrative   Patient is single and lives with her parents.   Patient is disabled.   Patient has a college education.   Patient is right- handed.   Patient drinks tea occasionally. 2 glasses of tea when they eat out.    Past Surgical History:  Procedure Laterality Date  . A/V FISTULAGRAM Left 07/21/2016   Procedure: A/V Fistulagram;  Surgeon: Algernon Huxley, MD;  Location: [facility];  Service: Cardiovascular;  Laterality: Left;  . A/V FISTULAGRAM Left 12/24/2016   Procedure: A/V Fistulagram;  Surgeon: Algernon Huxley, MD;  Location: [facility];  Service: Cardiovascular;  Laterality: Left;  . A/V FISTULAGRAM Left 06/29/2017   Procedure: A/V FISTULAGRAM;  Surgeon: Algernon Huxley, MD;  Location: [facility];  Service: Cardiovascular;  Laterality: Left;  . A/V SHUNT INTERVENTION N/A 07/21/2016   Procedure: A/V Shunt Intervention;  Surgeon: Algernon Huxley, MD;  Location: [facility];  Service: Cardiovascular;  Laterality: N/A;  . A/V SHUNT INTERVENTION N/A 12/24/2016   Procedure: A/V SHUNT INTERVENTION;  Surgeon: Algernon Huxley, MD;  Location: [facility];  Service: Cardiovascular;  Laterality: N/A;  . AVF    . CHOLECYSTECTOMY    . COLONOSCOPY    . COLONOSCOPY WITH PROPOFOL N/A 03/18/2017   Procedure: COLONOSCOPY WITH PROPOFOL;  Surgeon:  Manya Silvas, MD;  Location: [facility] ENDOSCOPY;  Service: Endoscopy;  Laterality: N/A;  . ENDOMETRIAL BIOPSY     ablation, uterine  . ESOPHAGOGASTRODUODENOSCOPY (EGD) WITH PROPOFOL N/A 03/18/2017   Procedure: ESOPHAGOGASTRODUODENOSCOPY (EGD) WITH PROPOFOL;  Surgeon: Manya Silvas, MD;  Location: [facility] ENDOSCOPY;  Service: Endoscopy;  Laterality: N/A;  . HERNIA REPAIR    . HERNIA REPAIR    . PERIPHERAL VASCULAR CATHETERIZATION N/A 08/23/2015   Procedure: Dialysis/Perma  Catheter Insertion;  Surgeon: Algernon Huxley, MD;  Location: [facility];  Service: Cardiovascular;  Laterality: N/A;  . PERIPHERAL VASCULAR CATHETERIZATION N/A 11/09/2015   Procedure: Dialysis/Perma Catheter Removal;  Surgeon: Katha Cabal, MD;  Location: [place] [facility];  Service: Cardiovascular;  Laterality: N/A;  . PERIPHERAL VASCULAR CATHETERIZATION Left 04/23/2016   Procedure: A/V Fistulagram;  Surgeon: Algernon Huxley, MD;  Location: [place] [facility];  Service: Cardiovascular;  Laterality: Left;  . PORT A CATH REVISION      Family History  Problem Relation Age of Onset  . Diabetes Mother   . Lumbar disc disease Mother   . Migraines Mother   . Hypertension Mother   . Cancer - Prostate Father        mild  . Diabetes Father   . Hypertension Father   . Breast cancer Neg Hx   . Ovarian cancer Neg Hx   . Colon cancer Neg Hx   . Heart disease Neg Hx   . Kidney cancer Neg Hx   . Bladder Cancer Neg Hx     Allergies  Allergen Reactions  . Cinnamon Other (See Comments)    Raises blood pressure  . Procrit [Epoetin Alfa] Other (See Comments)    Seizures, grand mal  . Ace Inhibitors Cough  . Azithromycin Other (See Comments)    seizures  . Iron Other (See Comments)    Can not take in IV Form - causes seizures   . Neomycin-Bacitracin Zn-Polymyx Other (See Comments)    Unknown  . Sulfa Antibiotics Other (See Comments)    Due to risks for seizures.     Review of Systems   Review of Systems:  Negative Unless Checked Constitutional: [] Weight loss  [] Fever  [] Chills Cardiac: [] Chest pain   []  Atrial Fibrillation  [] Palpitations   [] Shortness of breath when laying flat   [] Shortness of breath with exertion. Vascular:  [] Pain in legs with walking   [] Pain in legs with standing  [] History of DVT   [] Phlebitis   [] Swelling in legs   [] Varicose veins   [] Non-healing ulcers Pulmonary:   [] Uses home oxygen   [] Productive cough   [] Hemoptysis   [] Wheeze  [] COPD   [] Asthma Neurologic:  [] Dizziness   [] Seizures   [] History of stroke   [] History of TIA  [] Aphasia   [] Vissual changes   [] Weakness or numbness in arm   [] Weakness or numbness in leg Musculoskeletal:   [] Joint swelling   [] Joint pain   [] Low back pain  []  History of Knee Replacement Hematologic:  [] Easy bruising  [] Easy bleeding   [] Hypercoagulable state   [x] Anemic Gastrointestinal:  [] Diarrhea   [] Vomiting  [] Gastroesophageal reflux/heartburn   [] Difficulty swallowing. Genitourinary:  [x] Chronic kidney disease   [] Difficult urination  [] Anuric   [] Blood in urine Skin:  [] Rashes   [] Ulcers  Psychological:  [] History of anxiety   [x]  History of major depression  [x]  Memory Difficulties     Objective:   Physical Exam  BP (!) 163/66 (BP Location: Right Arm, Patient Position: Sitting)   Pulse 62   Resp 16   Ht 5' 2.5" (1.588 m)   Wt 213 lb (96.6 kg)   BMI 38.34 kg/m   Gen: WD/WN, NAD Head: [place]/AT, No temporalis wasting.  Ear/Nose/Throat: Hearing grossly intact, nares w/o erythema or drainage Eyes: PER, EOMI, sclera nonicteric.  Neck: Supple, no masses.  No JVD.  Pulmonary:  Good air movement, no use of accessory muscles.  Cardiac: RRR  Vascular: thrill and bruit present Vessel Right Left  Radial Palpable Palpable   Gastrointestinal: soft, non-distended. No guarding/no peritoneal signs.  Musculoskeletal: M/S 5/5 throughout.  No deformity or atrophy.  Neurologic: Pain and light touch intact in extremities.  Symmetrical.   Speech is fluent. Motor exam as listed above. Psychiatric: Judgment intact, Mood & affect appropriate for pt's clinical situation. Dermatologic: No Venous rashes. No Ulcers Noted.  No changes consistent with cellulitis. Lymph : No Cervical lymphadenopathy, no lichenification or skin changes of chronic lymphedema.      Assessment & Plan:   1. End-stage renal disease on hemodialysis [facility]) Recommend:  The patient is experiencing increasing problems with their dialysis access.  Patient should have a fistulagram with the intention for intervention.  The intention for intervention is to restore appropriate flow and prevent thrombosis and possible loss of the access.  As well as improve the quality of dialysis therapy.  The risks, benefits and alternative therapies were reviewed in detail with the patient.  All questions were answered.  The patient agrees to proceed with angio/intervention.      2. Type 2 diabetes mellitus with chronic kidney disease on chronic dialysis, unspecified whether long term insulin use ([place]) Continue hypoglycemic medications as already ordered, these medications have been reviewed and there are no changes at this time.  Hgb A1C to be monitored as already arranged by primary service   3. Benign essential HTN Continue antihypertensive medications as already ordered, these medications have been reviewed and there are no changes at this time.    Current Outpatient Medications on File Prior to Visit  Medication Sig Dispense Refill  . amLODipine (NORVASC) 5 MG tablet Take 5 mg by mouth See admin instructions. Take 5 mg by mouth daily except do not take on Tuesday, Thursday and Saturday.    Marland Kitchen aspirin 81 MG tablet Take 81 mg by mouth daily.     Marland Kitchen b complex vitamins tablet Take 1 tablet by mouth at bedtime.     . B Complex-C-Folic Acid (DIALYVITE TABLET) TABS Take 1 tablet by mouth daily.  12  . benzonatate (TESSALON) 200 MG capsule Take by mouth.    . cephALEXin  (KEFLEX) 500 MG capsule Take 1 capsule (500 mg total) by mouth 3 (three) times daily. 30 capsule 0  . Cholecalciferol (VITAMIN D3) 1000 units CAPS Take by mouth.    . clonazePAM (KLONOPIN) 1 MG tablet TAKE ONE TABLET BY MOUTH AT BEDTIME 30 tablet 3  . cyanocobalamin (,VITAMIN B-12,) 1000 MCG/ML injection Inject 1,000 mcg into the muscle every 14 (fourteen) days.     Marland Kitchen epoetin alfa (EPOGEN,PROCRIT) 3000 UNIT/ML injection Inject 3,000 Units into the vein every Monday, Wednesday, and Friday.    . ethosuximide (ZARONTIN) 250 MG/5ML solution TAKE 5ML BY MOUTH 2 TIMES DAILY (Patient taking differently: Take 250 mg by mouth 2 (two) times daily. ) 474 mL 3  . famotidine (PEPCID) 40 MG tablet Take by mouth.    . Febuxostat (ULORIC) 80 MG TABS Take 80 mg by mouth daily.     . ferric citrate (AURYXIA) 1 GM 210 MG(Fe) tablet Take 1 tablet by mouth 2 (two) times daily.     . fexofenadine (ALLEGRA) 180 MG tablet Take 180 mg by mouth daily.     . Fluticasone-Salmeterol (ADVAIR DISKUS) 250-50 MCG/DOSE AEPB Inhale 1 puff into the lungs 2 (two) times daily.    . folic acid-vitamin b complex-vitamin c-selenium-zinc (DIALYVITE) 3 MG TABS tablet Take 1 tablet by mouth  daily.    . furosemide (LASIX) 20 MG tablet Take 30 mg by mouth 2 (two) times daily.     . heparin 1000 unit/ml SOLN injection Inject into the peritoneum as needed.    . hydrALAZINE (APRESOLINE) 100 MG tablet Take 100 mg by mouth See admin instructions. Take 100 mg by mouth twice daily on non-dialysis days. Take 100 mg by mouth at bedtime on dialysis days Tuesday, Thursday and Saturday    . insulin glargine (LANTUS) 100 UNIT/ML injection Inject 22 Units into the skin at bedtime.     . insulin lispro (HUMALOG) 100 UNIT/ML injection Inject 10-15 Units into the skin 3 (three) times daily with meals. Per sliding scale    . LAMICTAL 200 MG tablet Take 1.5 tablets (300 mg total) by mouth 2 (two) times daily. 90 tablet 5  . levothyroxine (SYNTHROID) 150 MCG  tablet Take 150 mcg by mouth daily before breakfast.     . metoprolol succinate (TOPROL XL) 50 MG 24 hr tablet Take 1 tablet (50 mg total) by mouth 2 (two) times daily. Take with or immediately following a meal. (Patient taking differently: Take 75 mg by mouth See admin instructions. Take 75 mg by mouth twice daily on non dialysis days. Take 75 mg by mouth at bedtime on dialysis days Tuesday, Thursday and Saturday.) 60 tablet 11  . montelukast (SINGULAIR) 10 MG tablet Take 10 mg by mouth at bedtime.    Marland Kitchen nystatin-triamcinolone (MYCOLOG II) cream Apply 1 application topically 2 (two) times daily. (Patient taking differently: Apply 1 application topically daily as needed (rash). ) 30 g 1  . pantoprazole (PROTONIX) 40 MG tablet Take 40 mg by mouth every morning.     . Probiotic Product (ALIGN) 4 MG CAPS Take 4 mg by mouth daily.     . TEGRETOL-XR 100 MG 12 hr tablet Take 1 tablet bid for 2 weeks and than 1 in Pm for the rest of the bottle. 180 tablet 2  . triamcinolone (NASACORT ALLERGY 24HR) 55 MCG/ACT AERO nasal inhaler Place 2 sprays into the nose at bedtime.     Marland Kitchen UNIFINE PENTIPS 32G X 4 MM MISC USE UP TO 5 TIMES DAILY AS DIRECTED  3   No current facility-administered medications on file prior to visit.     There are no Patient Instructions on file for this visit. No follow-ups on file.   Kris Hartmann, NP

## 2018-02-24 ENCOUNTER — Other Ambulatory Visit: Payer: Self-pay | Admitting: Neurology

## 2018-03-05 ENCOUNTER — Encounter (INDEPENDENT_AMBULATORY_CARE_PROVIDER_SITE_OTHER): Payer: Medicare Other

## 2018-03-05 ENCOUNTER — Ambulatory Visit (INDEPENDENT_AMBULATORY_CARE_PROVIDER_SITE_OTHER): Payer: Medicare Other | Admitting: Vascular Surgery

## 2018-03-07 ENCOUNTER — Other Ambulatory Visit (INDEPENDENT_AMBULATORY_CARE_PROVIDER_SITE_OTHER): Payer: Self-pay | Admitting: Nurse Practitioner

## 2018-03-07 MED ORDER — CEFAZOLIN SODIUM-DEXTROSE 1-4 GM/50ML-% IV SOLN
1.0000 g | Freq: Once | INTRAVENOUS | Status: AC
  Administered 2018-03-08: 1 g via INTRAVENOUS

## 2018-03-08 ENCOUNTER — Encounter
Admission: RE | Disposition: A | Discharge status: Home / Self Care | Payer: Self-pay | Source: Ambulatory Visit | Attending: Vascular Surgery

## 2018-03-08 ENCOUNTER — Other Ambulatory Visit: Payer: Self-pay

## 2018-03-08 ENCOUNTER — Ambulatory Visit
Admission: RE | Admit: 2018-03-08 | Discharge: 2018-03-08 | Disposition: A | Discharge status: Home / Self Care | Payer: Medicare Other | Source: Ambulatory Visit | Attending: Vascular Surgery | Admitting: Vascular Surgery

## 2018-03-08 DIAGNOSIS — M1990 Unspecified osteoarthritis, unspecified site: Secondary | ICD-10-CM | POA: Diagnosis not present

## 2018-03-08 DIAGNOSIS — I132 Hypertensive heart and chronic kidney disease with heart failure and with stage 5 chronic kidney disease, or end stage renal disease: Secondary | ICD-10-CM | POA: Diagnosis not present

## 2018-03-08 DIAGNOSIS — Z6838 Body mass index (BMI) 38.0-38.9, adult: Secondary | ICD-10-CM | POA: Diagnosis not present

## 2018-03-08 DIAGNOSIS — Z992 Dependence on renal dialysis: Secondary | ICD-10-CM | POA: Insufficient documentation

## 2018-03-08 DIAGNOSIS — N186 End stage renal disease: Secondary | ICD-10-CM | POA: Diagnosis not present

## 2018-03-08 DIAGNOSIS — Z7982 Long term (current) use of aspirin: Secondary | ICD-10-CM | POA: Insufficient documentation

## 2018-03-08 DIAGNOSIS — E785 Hyperlipidemia, unspecified: Secondary | ICD-10-CM | POA: Diagnosis not present

## 2018-03-08 DIAGNOSIS — E0590 Thyrotoxicosis, unspecified without thyrotoxic crisis or storm: Secondary | ICD-10-CM | POA: Diagnosis not present

## 2018-03-08 DIAGNOSIS — Z882 Allergy status to sulfonamides status: Secondary | ICD-10-CM | POA: Insufficient documentation

## 2018-03-08 DIAGNOSIS — Z888 Allergy status to other drugs, medicaments and biological substances status: Secondary | ICD-10-CM | POA: Diagnosis not present

## 2018-03-08 DIAGNOSIS — Z881 Allergy status to other antibiotic agents status: Secondary | ICD-10-CM | POA: Diagnosis not present

## 2018-03-08 DIAGNOSIS — Z7951 Long term (current) use of inhaled steroids: Secondary | ICD-10-CM | POA: Insufficient documentation

## 2018-03-08 DIAGNOSIS — E1122 Type 2 diabetes mellitus with diabetic chronic kidney disease: Secondary | ICD-10-CM | POA: Insufficient documentation

## 2018-03-08 DIAGNOSIS — Z833 Family history of diabetes mellitus: Secondary | ICD-10-CM | POA: Insufficient documentation

## 2018-03-08 DIAGNOSIS — Z8249 Family history of ischemic heart disease and other diseases of the circulatory system: Secondary | ICD-10-CM | POA: Insufficient documentation

## 2018-03-08 DIAGNOSIS — G4730 Sleep apnea, unspecified: Secondary | ICD-10-CM | POA: Diagnosis not present

## 2018-03-08 DIAGNOSIS — Z91018 Allergy to other foods: Secondary | ICD-10-CM | POA: Diagnosis not present

## 2018-03-08 DIAGNOSIS — Y832 Surgical operation with anastomosis, bypass or graft as the cause of abnormal reaction of the patient, or of later complication, without mention of misadventure at the time of the procedure: Secondary | ICD-10-CM | POA: Diagnosis not present

## 2018-03-08 DIAGNOSIS — F319 Bipolar disorder, unspecified: Secondary | ICD-10-CM | POA: Insufficient documentation

## 2018-03-08 DIAGNOSIS — T82858A Stenosis of vascular prosthetic devices, implants and grafts, initial encounter: Secondary | ICD-10-CM | POA: Insufficient documentation

## 2018-03-08 DIAGNOSIS — R569 Unspecified convulsions: Secondary | ICD-10-CM | POA: Insufficient documentation

## 2018-03-08 DIAGNOSIS — J449 Chronic obstructive pulmonary disease, unspecified: Secondary | ICD-10-CM | POA: Insufficient documentation

## 2018-03-08 DIAGNOSIS — Z794 Long term (current) use of insulin: Secondary | ICD-10-CM | POA: Diagnosis not present

## 2018-03-08 DIAGNOSIS — Z79899 Other long term (current) drug therapy: Secondary | ICD-10-CM | POA: Insufficient documentation

## 2018-03-08 DIAGNOSIS — I2510 Atherosclerotic heart disease of native coronary artery without angina pectoris: Secondary | ICD-10-CM | POA: Insufficient documentation

## 2018-03-08 DIAGNOSIS — T82898A Other specified complication of vascular prosthetic devices, implants and grafts, initial encounter: Secondary | ICD-10-CM | POA: Diagnosis not present

## 2018-03-08 DIAGNOSIS — Z9049 Acquired absence of other specified parts of digestive tract: Secondary | ICD-10-CM | POA: Insufficient documentation

## 2018-03-08 DIAGNOSIS — Z9889 Other specified postprocedural states: Secondary | ICD-10-CM | POA: Insufficient documentation

## 2018-03-08 DIAGNOSIS — Z79890 Hormone replacement therapy (postmenopausal): Secondary | ICD-10-CM | POA: Insufficient documentation

## 2018-03-08 DIAGNOSIS — I509 Heart failure, unspecified: Secondary | ICD-10-CM | POA: Diagnosis not present

## 2018-03-08 DIAGNOSIS — K219 Gastro-esophageal reflux disease without esophagitis: Secondary | ICD-10-CM | POA: Diagnosis not present

## 2018-03-08 DIAGNOSIS — N3281 Overactive bladder: Secondary | ICD-10-CM | POA: Insufficient documentation

## 2018-03-08 HISTORY — PX: A/V FISTULAGRAM: CATH118298

## 2018-03-08 LAB — POTASSIUM (ARMC VASCULAR LAB ONLY): Potassium (ARMC vascular lab): 5.3 — ABNORMAL HIGH (ref 3.5–5.1)

## 2018-03-08 LAB — GLUCOSE, CAPILLARY
Glucose-Capillary: 164 mg/dL — ABNORMAL HIGH (ref 70–99)
Glucose-Capillary: 143 mg/dL — ABNORMAL HIGH (ref 70–99)

## 2018-03-08 SURGERY — A/V FISTULAGRAM
Anesthesia: Moderate Sedation | Laterality: Left

## 2018-03-08 MED ORDER — HEPARIN SODIUM (PORCINE) 1000 UNIT/ML IJ SOLN
INTRAMUSCULAR | Status: AC
  Filled 2018-03-08: qty 1

## 2018-03-08 MED ORDER — SODIUM CHLORIDE 0.9 % IV SOLN
INTRAVENOUS | Status: DC
  Administered 2018-03-08: 10:00:00 via INTRAVENOUS

## 2018-03-08 MED ORDER — HEPARIN (PORCINE) IN NACL 1000-0.9 UT/500ML-% IV SOLN
INTRAVENOUS | Status: AC
  Filled 2018-03-08: qty 1000

## 2018-03-08 MED ORDER — FENTANYL CITRATE (PF) 100 MCG/2ML IJ SOLN
INTRAMUSCULAR | Status: DC | PRN
  Administered 2018-03-08: 12.5 ug via INTRAVENOUS
  Administered 2018-03-08: 25 ug via INTRAVENOUS
  Administered 2018-03-08: 12.5 ug via INTRAVENOUS
  Administered 2018-03-08: 50 ug via INTRAVENOUS

## 2018-03-08 MED ORDER — MIDAZOLAM HCL 5 MG/5ML IJ SOLN
INTRAMUSCULAR | Status: AC
  Filled 2018-03-08: qty 5

## 2018-03-08 MED ORDER — HYDROMORPHONE HCL 1 MG/ML IJ SOLN: 1.0000 mg | Freq: Once | INTRAMUSCULAR | Status: DC | PRN

## 2018-03-08 MED ORDER — ONDANSETRON HCL 4 MG/2ML IJ SOLN
INTRAMUSCULAR | Status: AC
  Filled 2018-03-08: qty 2

## 2018-03-08 MED ORDER — ONDANSETRON HCL 4 MG/2ML IJ SOLN
4.0000 mg | Freq: Four times a day (QID) | INTRAMUSCULAR | Status: DC | PRN
  Administered 2018-03-08: 4 mg via INTRAVENOUS

## 2018-03-08 MED ORDER — LIDOCAINE-EPINEPHRINE (PF) 1 %-1:200000 IJ SOLN
INTRAMUSCULAR | Status: AC
  Filled 2018-03-08: qty 30

## 2018-03-08 MED ORDER — IOPAMIDOL (ISOVUE-300) INJECTION 61%
INTRAVENOUS | Status: DC | PRN
  Administered 2018-03-08: 25 mL via INTRA_ARTERIAL

## 2018-03-08 MED ORDER — HEPARIN SODIUM (PORCINE) 1000 UNIT/ML IJ SOLN
INTRAMUSCULAR | Status: DC | PRN
  Administered 2018-03-08: 3000 [IU] via INTRAVENOUS

## 2018-03-08 MED ORDER — FENTANYL CITRATE (PF) 100 MCG/2ML IJ SOLN
INTRAMUSCULAR | Status: AC
  Filled 2018-03-08: qty 2

## 2018-03-08 MED ORDER — MIDAZOLAM HCL 2 MG/2ML IJ SOLN
INTRAMUSCULAR | Status: DC | PRN
  Administered 2018-03-08: 1 mg via INTRAVENOUS
  Administered 2018-03-08: 2 mg via INTRAVENOUS
  Administered 2018-03-08: 1 mg via INTRAVENOUS
  Administered 2018-03-08: 11:00:00
  Administered 2018-03-08: 1 mg via INTRAVENOUS

## 2018-03-08 SURGICAL SUPPLY — 12 items
BALLN LUTONIX DCB 5X60X130 (BALLOONS) ×2
BALLOON LUTONIX DCB 5X60X130 (BALLOONS) ×1 IMPLANT
CANNULA 5F STIFF (CANNULA) ×2 IMPLANT
CATH BEACON 5 .035 40 KMP TP (CATHETERS) ×1 IMPLANT
CATH BEACON 5 .038 40 KMP TP (CATHETERS) ×1
DEVICE PRESTO INFLATION (MISCELLANEOUS) ×2 IMPLANT
DRAPE BRACHIAL (DRAPES) ×2 IMPLANT
GLIDEWIRE STIFF .35X180X3 HYDR (WIRE) ×2 IMPLANT
PACK ANGIOGRAPHY (CUSTOM PROCEDURE TRAY) ×2 IMPLANT
SHEATH BRITE TIP 6FRX5.5 (SHEATH) ×2 IMPLANT
SUT MNCRL AB 4-0 PS2 18 (SUTURE) ×2 IMPLANT
WIRE MAGIC TOR.035 180C (WIRE) ×2 IMPLANT

## 2018-03-08 NOTE — Progress Notes (Signed)
Pt. Assisted back to bed from Bathroom. Ice pack to neck area still. Pt. States "I still feel a little nauseous." Placed on O2 at 2L/[place] for sats maintaining 89%; O2 sat. To 91-92% immediately after O2 placed on. No more vomiting post zofran IV.

## 2018-03-08 NOTE — Progress Notes (Signed)
Dr. Lucky Cowboy at bedside, speaking with pt. And her parents re: procedural results. All verbalized understanding.

## 2018-03-08 NOTE — Progress Notes (Signed)
Pt. C/o Nausea and needing to "go to the Bathroom quick." Pt. Assisted to BR: pt. Vomited clear emesis small amts., had a BM and voided. Pt. C/o "I feel really hot." Pt. Med. With Zofran 4 mg slow IVP now. Gave pt. An ice pack to back of neck.

## 2018-03-08 NOTE — Op Note (Addendum)
[facility]    OPERATIVE NOTE   PROCEDURE: 1.   Left radiocephalic arteriovenous fistula cannulation under ultrasound guidance 2.   Left arm fistulagram including central venogram 3.   Catheter placement into left brachial artery from a retrograde approach through the fistula and left upper extremity arteriogram 4.   Percutaneous transluminal angioplasty of the radiocephalic anastomosis including the radial artery and the cephalic vein just proximal and distal to the anastomosis with a 5 mm diameter by 6 cm length Lutonix drug-coated angioplasty balloon  PRE-OPERATIVE DIAGNOSIS: 1. ESRD 2. Poorly functional left radiocephalic AVF  POST-OPERATIVE DIAGNOSIS: same as above   SURGEON: Leotis Pain, MD  ANESTHESIA: local with MCS  ESTIMATED BLOOD LOSS: 5 cc  FINDING(S): 1. 60 to 65% stenosis from hyperplasia at the anastomosis in both the cephalic vein and the radial artery just beyond the anastomosis.  The remainder of the radial artery all the way up to the upper arm where there was a high brachial bifurcation was widely patent without stenosis.  The cephalic vein in the forearm beyond the anastomosis was widely patent.  There was dual outflow in the upper arm through both the basilic vein and the cephalic vein.  Central venous circulation was widely patent.  SPECIMEN(S):  None  CONTRAST: 25 cc  FLUORO TIME: 1.1 minutes  MODERATE CONSCIOUS SEDATION TIME: Approximately 15 minutes with 5 mg of Versed and 100 mcg of Fentanyl   INDICATIONS: Nina Johnston is a 57 y.o. female who presents with malfunctioning left radiocephalic arteriovenous fistula.  The patient is scheduled for left arm fistulagram.  There was also question of arterial inflow stenosis on her ultrasound prior to the procedure, so evaluation of her inflow arterial circuit is planned as well.  The patient is aware the risks include but are not limited to: bleeding, infection, thrombosis of the  cannulated access, and possible anaphylactic reaction to the contrast.  The patient is aware of the risks of the procedure and elects to proceed forward.  DESCRIPTION: After full informed written consent was obtained, the patient was brought back to the angiography suite and placed supine upon the angiography table.  The patient was connected to monitoring equipment. Moderate conscious sedation was administered with a face to face encounter with the patient throughout the procedure with my supervision of the RN administering medicines and monitoring the patient's vital signs and mental status throughout from the start of the procedure until the patient was taken to the recovery room. The left arm was prepped and draped in the standard fashion for a percutaneous access intervention.  Under ultrasound guidance, the mid forearm cephalic vein arteriovenous fistula was cannulated with a micropuncture needle under direct ultrasound guidance where it was large and patent in a retrograde fashion and a permanent image was performed.  The microwire was advanced into the fistula and the needle was exchanged for the a microsheath.  I then upsized to a 6 Fr Sheath and then used a Kumpe catheter to navigate across the anastomosis.  Using a Glidewire and the Kumpe catheter and navigated through the radial artery and the forearm and all the way into the upper arm at the brachial bifurcation.  At this point, using hand injections through the catheter I was able to evaluate the upper arm arterial circuit as well as the fistula and imaging was performed.  Hand injections were completed to image the access including the central venous system. This demonstrated 60 to 65% stenosis from hyperplasia at  the anastomosis in both the cephalic vein and the radial artery just beyond the anastomosis.  The remainder of the radial artery all the way up to the upper arm where there was a high brachial bifurcation was widely patent without  stenosis.  The cephalic vein in the forearm beyond the anastomosis was widely patent.  There was dual outflow in the upper arm through both the basilic vein and the cephalic vein.  Central venous circulation was widely patent.  Based on the images, this patient will need intervention to the hyperplastic stenosis at the anastomosis but otherwise the fistula looked quite good and the remainder of the arterial inflow look good as well. I then gave the patient 3000 units of intravenous heparin.  I then crossed the stenosis with a Magic Tourqe wire.  Based on the imaging, a 5 mm x 6 cm Lutonix drug-coated angioplasty balloon was selected.  The balloon was centered around the hyperplastic anastomotic stenosis with the distal portion of the balloon being a centimeter or 2 into the radial artery and the proximal portion of the balloon being a couple of centimeters into the cephalic vein and inflated to 14 ATM for 1 minute(s).  On completion imaging, a 15-20 % residual stenosis was present.     Based on the completion imaging, no further intervention is necessary.  The wire and balloon were removed from the sheath.  A 4-0 Monocryl purse-string suture was sewn around the sheath.  The sheath was removed while tying down the suture.  A sterile bandage was applied to the puncture site.  COMPLICATIONS: None  CONDITION: Stable   Leotis Pain  03/08/2018 10:25 AM   This note was created with Dragon Medical transcription system. Any errors in dictation are purely unintentional.

## 2018-03-08 NOTE — H&P (Signed)
[facility] History & Physical Update  The patient was interviewed and re-examined.  The patient's previous History and Physical has been reviewed and is unchanged.  There is no change in the plan of care. We plan to proceed with the scheduled procedure.  Leotis Pain, MD  03/08/2018, 9:10 AM

## 2018-03-09 ENCOUNTER — Encounter: Payer: Self-pay | Admitting: Vascular Surgery

## 2018-03-17 ENCOUNTER — Other Ambulatory Visit: Payer: Self-pay | Admitting: Neurology

## 2018-04-07 ENCOUNTER — Other Ambulatory Visit: Payer: Self-pay | Admitting: Neurology

## 2018-04-07 ENCOUNTER — Encounter: Payer: Self-pay | Admitting: Neurology

## 2018-04-07 ENCOUNTER — Ambulatory Visit (INDEPENDENT_AMBULATORY_CARE_PROVIDER_SITE_OTHER): Payer: Medicare Other | Admitting: Neurology

## 2018-04-07 ENCOUNTER — Telehealth: Payer: Self-pay | Admitting: Neurology

## 2018-04-07 VITALS — BP 128/67 | HR 70 | Ht 62.0 in | Wt 211.0 lb

## 2018-04-07 DIAGNOSIS — R404 Transient alteration of awareness: Secondary | ICD-10-CM

## 2018-04-07 DIAGNOSIS — E1122 Type 2 diabetes mellitus with diabetic chronic kidney disease: Secondary | ICD-10-CM | POA: Diagnosis not present

## 2018-04-07 DIAGNOSIS — Z992 Dependence on renal dialysis: Secondary | ICD-10-CM | POA: Diagnosis not present

## 2018-04-07 DIAGNOSIS — R4789 Other speech disturbances: Secondary | ICD-10-CM

## 2018-04-07 DIAGNOSIS — N186 End stage renal disease: Secondary | ICD-10-CM | POA: Insufficient documentation

## 2018-04-07 DIAGNOSIS — T8619 Other complication of kidney transplant: Secondary | ICD-10-CM

## 2018-04-07 DIAGNOSIS — G40209 Localization-related (focal) (partial) symptomatic epilepsy and epileptic syndromes with complex partial seizures, not intractable, without status epilepticus: Secondary | ICD-10-CM | POA: Insufficient documentation

## 2018-04-07 DIAGNOSIS — R270 Ataxia, unspecified: Secondary | ICD-10-CM

## 2018-04-07 MED ORDER — ETHOSUXIMIDE 250 MG/5ML PO SOLN: ORAL | 3 refills | Status: DC

## 2018-04-07 MED ORDER — LEVETIRACETAM 250 MG PO TABS: 250.0000 mg | ORAL_TABLET | Freq: Two times a day (BID) | ORAL | 5 refills | Status: DC

## 2018-04-07 NOTE — Progress Notes (Signed)
[facility]   Provider:  Dr Brett Fairy Referring Provider: / Primary Care Physician:  Idelle Crouch, MD  Seizure like episodes - chief complaint.   HPI:  Nina Johnston is a 57 y.o. female here for a RV :   04-07-2018, "physically not the most of all antiseizure medicines but it is a good as a pleasure of meeting Nina Johnston today, a 57 year old Caucasian right-handed female on hemodialysis with seizure episodes.  A description of her most recent spells that sound very much epileptiform is at the bottom of today's note.  There is speech arrest, automatism, right hand tremor, left hand fisting, a complete recovery within 20 minutes 5 minutes, followed by significant sleepiness and fatigue.  The patient continues to be on hemodialysis 3 times a week for 4 hours she has been able to control her fluid intake she has also been doing better with glucose control.  We are discussing today what kind of additional medication may help her to control her seizures especially since the spells occur on a dialysis day following her treatment.  We discussed Keppra as a possible treatment option since it is neither hepatically no renally metabolized.  Interval history 7-31 -2019  Many (5)  more spells since May visit. She has continued with hemodialysis- only on dialysis days. . She has hemoglobin at 8.8, phosphorus 353. She blames [name] - her phosphate binder - to have caused diarrhea and caused her to be embarassed .She also gets some iron through [name]. She cut down the dose as she had to go many times to the bathroom during dialysis. Now phosphate is up and her anemia is more severe. She has also high blood sugars some days.   She is taking a nap after supper - this seems to help the prevent spells.  We try clonazepam pre- dialysis to reduce the post dialysis spells,- these can be seizures, electrolyte shifting, glucose, HTN related., and address phosphate-binding medication that  may give less diarrhea. Nina Johnston will need to control Glucose, volume and naps times.    Interval history 09-23-2017, I have the pleasure of meeting with Nina Johnston in her family today, patient has been able to continue with regular hemodialysis and seems to be well dialyzed.  It took respiratory creation of an arteriovenous fistula to reach the patency, she had several revisions.  She had a spell on her way home last August another spell while in dialysis and then several short spells over September -October and January.  Epogen shots started at Nina Johnston on 11 February and she gets additional shots every 2 days until 25 June 2017.  Starting dose with 3000 units and she is now reaching 5000 units.  She is also taking oral iron and Pepcid to help with gastritis and any blood loss from there.  Over 2 months after Epogen was initiated she had a spell in the afternoon while riding in a car, Nina Johnston had to vomit and on 28 April the second spell at home that lasted about 10 minutes.  Her mother has witnessed both describes her as unresponsive to verbal haptic or acoustic stimuli or visual stimuli.  She seems to daydream she is breathing regularly there is no convulsion noted, and her mother has witnessed these on nondialysis days in the afternoon hours.  Her sugar has never been too low, she has not fallen she has not manifested any convulsions.  Given the almost 2 months time difference between Epogen and the spell occurrences I  do not think that they are related I hope that the higher hemoglobin and hematocrit will help her having less lightheadedness less fatigue finding more energy overall.  At this time she is taking an oral iron citrate, and it seems not necessary to add IV iron.  I would like her to continue with the treatment of anemia as laid out.  She may remain on 5000 units and oral iron. Keppra -     Interval history from 08 June 2017,  I have the pleasure of seeing Nina Johnston today, following a  spell on January 29 during dialysis in which she stared off and was not responsive.  She also had a spell the same night at home witnessed by her mother.  There were further spells on August 1, August 31, September 12 and 14 as well as 22nd, October 16 and October 23.  Only 2 of these spells were prolonged with associated confusion or disorientation to her surroundings. Since he has saw her hematologist last Wednesday, her hematocrit and hemoglobin levels have been very low.  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  This occurred in 2011 and 2012.  She has not received any erythropoietin after that but I think she needs now.  In order to overcome this very severe anemia.  She has been transfused multiple times ( 9 times )  and that cannot be healthy either. Her hematologist and her nephrologist are both leading towards erythropoietin therapy again.  Her nephrologist is Audiological scientist, pathologist is Evelena Asa at [facility]. It's causing problems with dialysis effectiveness ( BP of 80/ 40 mmHg) , and with SOB.    CD_ This Caucasian right-handed, single female has been followed in this practice since 2009. 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  She had such spells in August 2010 and September 2011 after carbamazepine was changed to a generic form of Carbatrol her blood levels were lower,  she also developed upper respiratory tract infections and pneumonia in 2011 and was unable for a while to use her CPAP which had been initiated in [place] for the treatment of obstructive sleep apnea. Paroxysmal events with mental  status changes had continued,  03/17/2011  and 20 13 some of the spells are described as " automatisms"- continuing behavior while  the patient is staring off and acting without  reflecting not being  responsive to stimulation from the outside . After the spell passes, she became very sleepy each time. In August 2012 she was finally referred to an endocrinologist at [facility] after she had a hypoglycemic seizure and her diabetes medications were reduced, now her blood sugars are running between 150 and 200 in the morning,  fasting.[name] begun working out in 2013 with a Physiological scientist and was able to lose about 30 pounds . She returned to using her CPAP.her Hb A1 C. in October of last year was 6.1- she had no other hypoglycemic events reported since March 2013 . Also in 2013 she had to visit the local ER twice for abdominal spasms and cardiac / chest pain. negative work up. She was developing renal failure.  She has since been followed for a decreased renal  clearance by internal medicine and nephrology. The patient is treated for her spells  and remains on Tegretol. It she will need blood levels for her anti-epiletic medications. Dr. Geryl Councilman is debating to reduce her Klonopin , which may help reduce her fall risk as well. Vit D deficiency- takes supplement.    Note contact at [facility]; transplant. Dr . Talmadge Coventry,  [facility] [place] [phone]. History from 04/16/2016. Nina Johnston is here today for a routine revisit but she did have some healthcare over the last week. She is now on hemodialysis, and experienced bleeding at the AV fistula of her left arm during the last treatment. She notice that the patella was blood drenched but her treatment time was almost over. She also had to be evaluated at the local hospital emergency room for a malignant blood pressure peak. Her systolic blood pressure went up , she had a very high heart rate between 150s and 160 bpm. The ED suspected that she had atrial flutter, and she  had hypokalemia. Hgb was 8.2.  on last Monday she was seen by cardiology and told she had sinus tachycardia. Chest Xray as she coughed blood. mammogram was due, normal, and potassium /agnesium was normalized.   Interval history from 12/03/2016. I have pleasure of seeing Mrs. Vienna Folden today who has been on hemodialysis since 18 month ago, she has a left arm AV fistula for hemodialysis access, her blood pressures have been lower especially in treatment and she was asked not to take hypertension medications prior to treatment. She will take the medication either after or the day before. I have The patient on antiepileptic medications after she had recurrent convulsions but we also found a correlation to hypoglycemia.  She has remained on Tegretol. She continues to take Lamictal. New problem: tachycardia since blood transfusion. Her hemoglobin was 6.9, her pulse rate after transfusion was well in the 120s. Pulse rate addressed today was 120. Please note that the patient had also tachycardia up to rates between 150s and 160 in December 2017. At that time she was diagnosed with hypokalemia, her hemoglobin then was 8.2. She needs to speak to her nephrologist.   Review of Systems: Out of a complete 14 system review, the patient complains of only the following symptoms:       Social History   Socioeconomic History  . Marital status: Single    Spouse name: Not on file  . Number of children: 0  . Years of education:  college  . Highest education level: Not on file  Occupational History  . 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 Financial resource strain: Not very hard  . Food insecurity:    Worry: Sometimes true    Inability: Sometimes true  . Transportation needs:    Medical: No    Non-medical: No  Tobacco Use  . Smoking status: Never Smoker  . Smokeless tobacco: Never Used  Substance and Sexual Activity  . Alcohol use: No  . Drug use: No  . Sexual activity: Not Currently   Lifestyle  . Physical activity:    Days per week: 0 days    Minutes per session: Not on file  . Stress: Not at all  Relationships  . Social connections:    Talks on phone: More than three times a week    Gets together: More than three times a week    Attends religious service: More than 4 times per year    Active member of club  or organization: Yes    Attends meetings of clubs or organizations: 1 to 4 times per year    Relationship status: Divorced  . Intimate partner violence:    Fear of current or ex partner: No    Emotionally abused: No    Physically abused: No    Forced sexual activity: No  Other Topics Concern  . Not on file  Social History Narrative   Patient is single and lives with her parents.   Patient is disabled.   Patient has a college education.   Patient is right- handed.   Patient drinks tea occasionally. 2 glasses of tea when they eat out.    Family History  Problem Relation Age of Onset  . Diabetes Mother   . Lumbar disc disease Mother   . Migraines Mother   . Hypertension Mother   . Cancer - Prostate Father        mild  . Diabetes Father   . Hypertension Father   . Breast cancer Neg Hx   . Ovarian cancer Neg Hx   . Colon cancer Neg Hx   . Heart disease Neg Hx   . Kidney cancer Neg Hx   . Bladder Cancer Neg Hx     Past Medical History:  Diagnosis Date  . A-V fistula ([place])   . Acute bacterial sinusitis 04/19/2014  . Adaptive colitis   . Anginal pain ([place])   . Anxiety   . Anxiety and depression   . Arthritis   . Asthma   . 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 Benign essential HTN 12/26/2014  . Bipolar disorder ([place])   . Bronchitis   . Carotid artery narrowing 12/22/2014  . Cervical prolapse   . CHF (congestive heart failure) ([place])   . CKD (chronic kidney disease)   . Concussion 01-14-14   Fall   . COPD (chronic obstructive pulmonary disease) ([place])   . Coronary  artery disease   . Depression   . Diabetes mellitus without complication ([place])   . Dialysis patient ([place])   . Diplopia   . Dupuytren's contracture of foot   . Essential (primary) hypertension 02/12/2015  . Family history of thyroid problem   . GERD (gastroesophageal reflux disease)   . Gout   . Heart disease    enlarged because of COPD  . Heart murmur   . Hepatomegaly   . HLD (hyperlipidemia)   . Hyperactive airway disease   . Hyperkalemia   . Hypertension   . Hyperthyroidism   . Irritable colon   . Morbid obesity ([place])   . Multiple thyroid nodules   . OAB (overactive bladder)   . Pernicious anemia   . Post menopausal syndrome   . Post-concussion syndrome 03/08/2014  . Post-traumatic brain syndrome   . Prolapsed uterus   . PSVT (paroxysmal supraventricular tachycardia) ([place])   . Renal calculus   . Renal disease    w/ GFR 29-may be due to diabetes  . Seizures ([place])    last seizure 2012. has had spells since with no knowledge. last was 1 month ago  . Sleep apnea   . 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 Transient alteration of awareness 11/10/2012  . Urinary incontinence with continuous leakage 03/08/2014    Past Surgical History:  Procedure Laterality Date  . A/V FISTULAGRAM Left 07/21/2016  Procedure: A/V Fistulagram;  Surgeon: Algernon Huxley, MD;  Location: [place] [facility];  Service: Cardiovascular;  Laterality: Left;  . A/V FISTULAGRAM Left 12/24/2016   Procedure: A/V Fistulagram;  Surgeon: Algernon Huxley, MD;  Location: [place] [facility];  Service: Cardiovascular;  Laterality: Left;  . A/V FISTULAGRAM Left 06/29/2017   Procedure: A/V FISTULAGRAM;  Surgeon: Algernon Huxley, MD;  Location: [place] [facility];  Service: Cardiovascular;  Laterality: Left;  . A/V FISTULAGRAM Left 03/08/2018   Procedure: A/V FISTULAGRAM;  Surgeon: Algernon Huxley, MD;  Location: [place] [facility];  Service: Cardiovascular;  Laterality: Left;  . A/V  SHUNT INTERVENTION N/A 07/21/2016   Procedure: A/V Shunt Intervention;  Surgeon: Algernon Huxley, MD;  Location: [place] [facility];  Service: Cardiovascular;  Laterality: N/A;  . A/V SHUNT INTERVENTION N/A 12/24/2016   Procedure: A/V SHUNT INTERVENTION;  Surgeon: Algernon Huxley, MD;  Location: [place] [facility];  Service: Cardiovascular;  Laterality: N/A;  . AVF    . CHOLECYSTECTOMY    . COLONOSCOPY    . COLONOSCOPY WITH PROPOFOL N/A 03/18/2017   Procedure: COLONOSCOPY WITH PROPOFOL;  Surgeon: Manya Silvas, MD;  Location: [facility] ENDOSCOPY;  Service: Endoscopy;  Laterality: N/A;  . ENDOMETRIAL BIOPSY     ablation, uterine  . ESOPHAGOGASTRODUODENOSCOPY (EGD) WITH PROPOFOL N/A 03/18/2017   Procedure: ESOPHAGOGASTRODUODENOSCOPY (EGD) WITH PROPOFOL;  Surgeon: Manya Silvas, MD;  Location: [facility] ENDOSCOPY;  Service: Endoscopy;  Laterality: N/A;  . HERNIA REPAIR    . HERNIA REPAIR    . PERIPHERAL VASCULAR CATHETERIZATION N/A 08/23/2015   Procedure: Dialysis/Perma Catheter Insertion;  Surgeon: Algernon Huxley, MD;  Location: [place] [facility];  Service: Cardiovascular;  Laterality: N/A;  . PERIPHERAL VASCULAR CATHETERIZATION N/A 11/09/2015   Procedure: Dialysis/Perma Catheter Removal;  Surgeon: Katha Cabal, MD;  Location: [place] [facility];  Service: Cardiovascular;  Laterality: N/A;  . PERIPHERAL VASCULAR CATHETERIZATION Left 04/23/2016   Procedure: A/V Fistulagram;  Surgeon: Algernon Huxley, MD;  Location: [place] [facility];  Service: Cardiovascular;  Laterality: Left;  . PORT A CATH REVISION      Current Outpatient Medications  Medication Sig Dispense Refill  . aspirin 81 MG tablet Take 81 mg by mouth daily.     Marland Kitchen b complex vitamins tablet Take 1 tablet by mouth at bedtime.     . B Complex-C-Folic Acid (DIALYVITE TABLET) TABS Take 1 tablet by mouth daily.  12  . benzonatate (TESSALON) 200 MG capsule Take by mouth.    . clonazePAM (KLONOPIN) 1 MG tablet TAKE ONE TABLET BY MOUTH AT  BEDTIME 30 tablet 3  . clonazePAM (KLONOPIN) 1 MG tablet Take 1 tablet (1 mg total) by mouth 2 (two) times daily. 60 tablet 2  . cyanocobalamin (,VITAMIN B-12,) 1000 MCG/ML injection Inject 1,000 mcg into the muscle every 14 (fourteen) days.     Marland Kitchen epoetin alfa (EPOGEN,PROCRIT) 3000 UNIT/ML injection Inject 3,000 Units into the vein every Monday, Wednesday, and Friday.    . ethosuximide (ZARONTIN) 250 MG/5ML solution TAKE 5ML BY MOUTH 2 TIMES DAILY (Patient taking differently: Take 250 mg by mouth 2 (two) times daily. ) 474 mL 3  . famotidine (PEPCID) 40 MG tablet Take by mouth.    . Febuxostat 80 MG TABS Take 40 mg by mouth daily.    . ferric citrate (AURYXIA) 1 GM 210 MG(Fe) tablet Take by mouth 2 (two) times daily.     . fexofenadine (ALLEGRA)  180 MG tablet Take 180 mg by mouth daily.     . Fluticasone-Salmeterol (ADVAIR DISKUS) 250-50 MCG/DOSE AEPB Inhale 1 puff into the lungs 2 (two) times daily.    . folic acid-vitamin b complex-vitamin c-selenium-zinc (DIALYVITE) 3 MG TABS tablet Take 1 tablet by mouth daily.    . furosemide (LASIX) 20 MG tablet Take 30 mg by mouth 2 (two) times daily.     . hydrALAZINE (APRESOLINE) 100 MG tablet Take 100 mg by mouth See admin instructions. Take 100 mg by mouth twice daily on non-dialysis days. Take 100 mg by mouth at bedtime on dialysis days Tuesday, Thursday and Saturday    . insulin glargine (LANTUS) 100 UNIT/ML injection Inject 22 Units into the skin at bedtime.     . insulin lispro (HUMALOG) 100 UNIT/ML injection Inject 10-15 Units into the skin 3 (three) times daily with meals. Per sliding scale    . LAMICTAL 200 MG tablet TAKE 1 AND 1/2 TABLETS BY MOUTH TWICE A DAY 270 tablet 1  . levothyroxine (SYNTHROID) 150 MCG tablet Take 150 mcg by mouth daily before breakfast.     . metoprolol succinate (TOPROL XL) 50 MG 24 hr tablet Take 1 tablet (50 mg total) by mouth 2 (two) times daily. Take with or immediately following a meal. (Patient taking differently:  Take 75 mg by mouth See admin instructions. Take 75 mg by mouth twice daily on non dialysis days. Take 75 mg by mouth at bedtime on dialysis days Tuesday, Thursday and Saturday.) 60 tablet 11  . montelukast (SINGULAIR) 10 MG tablet Take 10 mg by mouth at bedtime.    Marland Kitchen nystatin-triamcinolone (MYCOLOG II) cream Apply 1 application topically 2 (two) times daily. (Patient taking differently: Apply 1 application topically daily as needed (rash). ) 30 g 1  . pantoprazole (PROTONIX) 40 MG tablet Take 40 mg by mouth every morning.     . Probiotic Product (ALIGN) 4 MG CAPS Take 4 mg by mouth daily.     . TEGRETOL-XR 100 MG 12 hr tablet Take 1 tablet bid for 2 weeks and than 1 in Pm for the rest of the bottle. 180 tablet 2  . triamcinolone (NASACORT ALLERGY 24HR) 55 MCG/ACT AERO nasal inhaler Place 2 sprays into the nose at bedtime.     Marland Kitchen UNIFINE PENTIPS 32G X 4 MM MISC USE UP TO 5 TIMES DAILY AS DIRECTED  3  . amLODipine (NORVASC) 5 MG tablet Take 5 mg by mouth See admin instructions. Take 5 mg by mouth daily except do not take on Tuesday, Thursday and Saturday.     No current facility-administered medications for this visit.     Allergies as of 04/07/2018 - Review Complete 04/07/2018  Allergen Reaction Noted  . Cinnamon Other (See Comments) 11/09/2012  . Procrit [epoetin alfa] Other (See Comments) 11/10/2012  . Ace inhibitors Cough 09/06/2014  . Azithromycin Other (See Comments) 09/06/2014  . Iron Other (See Comments) 03/31/2016  . Neomycin-bacitracin zn-polymyx Other (See Comments) 09/06/2014  . Sulfa antibiotics Other (See Comments) 09/06/2014     Hemoglobin at 10.7 ! Last transfusion 06/2017 .   Vitals: BP 128/67   Pulse 70   Ht 5\' 2"  (1.575 m)   Wt 211 lb (95.7 kg)   BMI 38.59 kg/m  Last Weight:  Wt Readings from Last 1 Encounters:  04/07/18 211 lb (95.7 kg)   Last Height:   Ht Readings from Last 1 Encounters:  04/07/18 5\' 2"  (1.575 m)   Vision Screening:  Physical  exam:  General: The patient is awake, alert and appears not in acute distress. The patient is well groomed. Head: Normocephalic, atraumatic. Neck is supple.  Mallampati 1 - there is no uvula , neck circumference:15.5 inches.    On renal low potassium diet, she lost 45 pounds, BMI 38.59 kg/m2  .  Cardiovascular:  Regular rate and rhythm, without  murmurs or carotid bruit, and without distended neck veins. Respiratory: Lungs are clear to auscultation. Skin: ankle  edema, no rash. Looking jaundiced, bronzed .  Dry skin, low TURGOR, appears dehydrated and dusky. Bronzed skin. .  Trunk: BMI is morbidly obese-  Abdominal hernia.  patient has normal posture.   Neurologic exam : The patient is awake and alert, oriented to place and time. Memory subjective described as intact.  There is a normal attention span & concentration ability. Speech is fluent without dysarthria or aphasia.  Mood and affect are defensive - she is unable to fathom to remain  On HD . Cranial nerves: Pupils are equal and briskly reactive to light. No disrounded pupils,  Yellowish sclerea. Visual fields by finger perimetry are intact.Hearing to finger rub intact. Facial sensation intact to fine touch. Facial motor strength is symmetric and tongue moves midline. Motor exam:  Normal tone and normal muscle bulk, obesity related attenuated DTRs but not absent.  , and symmetric normal strength in upper extremities.  She has an AV access fistula on the left arm. Thrill is palpable and audible.  Sensory: Fine touch, pinprick and vibration were tested in all extremities and are presents in toes and feet. Proprioception is tested and normal.  Coordination: Rapid alternating movements in the fingers/hands is tested and normal. Finger-to-nose maneuver tested and normal without evidence of ataxia, dysmetria or tremor.  Gait and station: Patient walks without assistive device and is very slow - Appears truncally rigid, with no lumbar  rotation, turns with 5 steps, and has reduced step width. Strength within normal limits. Stance is stable and normal. Tandem gait is fragmented. She shuffles.   Deep tendon reflexes: in the upper and lower extremities are symmetric and intact.  Babinski maneuver response is down going on the left and equivocal on the right.  Hypokalemia due to CRF, CKD grade 5, Hemodialysis   Dialysis 3 times a week. creatinine is around 7. 4 hour treatments. Not driving, but wants to.   Morbidly obese. Increasing weight- new dry weight 95.5 Kg-   Keep on anti epileptic meds, continue citric acid iron po.   I was quite concerned about Mrs. Johnston's anemia and her constant need for transfusions, and iron supplementation.  In the past I was very worried about erythropoietin because it seems to be time correlated to the few generalized tonic-clonic seizures she has suffered. Erythropoietin was re- initiated at 3.000 units and gradually up to 5.000 IU. and she regained Hgb and is less pale-   I want her to continue both erythropoetin and oral iron. These spells occur in the afternoon on non dialysis days, starting as repeated swallowing, she is not responding to visual a or verbal stimuli - but she breathes regularly.  The glucose is usually high, she takes AURYXIA for iron supplement and Pepcid. Unexplained     Recurrent convulsions due to hypoglycemia, hypotension on dialysis, but not due to erythropoetin.  She had a lot of new spells recently- staring, speech arrest, automatism,   Right hand shaking. Left hand fisted, She seems to turn away to the left- away from  her mother when approached.  All seizure seemed related to dialysis or on dilaysis days.  Lasting 10-25 minutes.  Very tired and sleepy afterwards.   Plan :  1)Starting on Keppra today. 2) We discussed if she could start home dialysis ?Could she have a longer, slower dialysis treatment ?.  3) 6 weeks of blood glucose journals reviewed today,  04-07-2018. Keep good control. She has done well with fluid restriction.   Larey Seat, MD   04-07-2018

## 2018-04-07 NOTE — Telephone Encounter (Signed)
Called the patient's mom back cause I was confused by the message. Appears Dr Dohmeier printed the script for the zarontin while patient was in the office and clonazepam should already be on file at the pharmacy with at least one refill left around 12/23. The lamictal was sent to the pharmacy also back on 11/13. Pt should be up to date on medications being completed. Pt mother verbalized understanding.

## 2018-04-07 NOTE — Telephone Encounter (Signed)
Pt is in need of refills for Ethosuximide 250 mg, Glaxo 200mg  (pt states this one has to be brand) also for Clonazel 1MG  Please call pt at either  Number [phone] or [phone] (cell for her mother)

## 2018-04-07 NOTE — Patient Instructions (Signed)
Levetiracetam tablets What is this medicine? LEVETIRACETAM (lee ve tye RA se tam) is an antiepileptic drug. It is used with other medicines to treat certain types of seizures. This medicine may be used for other purposes; ask your health care provider or pharmacist if you have questions. COMMON BRAND NAME(S): Keppra, Roweepra What should I tell my health care provider before I take this medicine? They need to know if you have any of these conditions: -kidney disease -suicidal thoughts, plans, or attempt; a previous suicide attempt by you or a family member -an unusual or allergic reaction to levetiracetam, other medicines, foods, dyes, or preservatives -pregnant or trying to get pregnant -breast-feeding How should I use this medicine? Take this medicine by mouth with a glass of water. Follow the directions on the prescription label. Swallow the tablets whole. Do not crush or chew this medicine. You may take this medicine with or without food. Take your doses at regular intervals. Do not take your medicine more often than directed. Do not stop taking this medicine or any of your seizure medicines unless instructed by your doctor or health care professional. Stopping your medicine suddenly can increase your seizures or their severity. A special MedGuide will be given to you by the pharmacist with each prescription and refill. Be sure to read this information carefully each time. Contact your pediatrician or health care professional regarding the use of this medication in children. While this drug may be prescribed for children as young as 4 years of age for selected conditions, precautions do apply. Overdosage: If you think you have taken too much of this medicine contact a poison control center or emergency room at once. NOTE: This medicine is only for you. Do not share this medicine with others. What if I miss a dose? If you miss a dose, take it as soon as you can. If it is almost time for your  next dose, take only that dose. Do not take double or extra doses. What may interact with this medicine? This medicine may interact with the following medications: -carbamazepine -colesevelam -probenecid -sevelamer This list may not describe all possible interactions. Give your health care provider a list of all the medicines, herbs, non-prescription drugs, or dietary supplements you use. Also tell them if you smoke, drink alcohol, or use illegal drugs. Some items may interact with your medicine. What should I watch for while using this medicine? Visit your doctor or health care professional for a regular check on your progress. Wear a medical identification bracelet or chain to say you have epilepsy, and carry a card that lists all your medications. It is important to take this medicine exactly as instructed by your health care professional. When first starting treatment, your dose may need to be adjusted. It may take weeks or months before your dose is stable. You should contact your doctor or health care professional if your seizures get worse or if you have any new types of seizures. You may get drowsy or dizzy. Do not drive, use machinery, or do anything that needs mental alertness until you know how this medicine affects you. Do not stand or sit up quickly, especially if you are an older patient. This reduces the risk of dizzy or fainting spells. Alcohol may interfere with the effect of this medicine. Avoid alcoholic drinks. The use of this medicine may increase the chance of suicidal thoughts or actions. Pay special attention to how you are responding while on this medicine. Any worsening of mood, or   thoughts of suicide or dying should be reported to your health care professional right away. Women who become pregnant while using this medicine may enroll in the [place] Antiepileptic Drug Pregnancy Registry by calling 1-888-233-2334. This registry collects information about the safety of  antiepileptic drug use during pregnancy. What side effects may I notice from receiving this medicine? Side effects that you should report to your doctor or health care professional as soon as possible: -allergic reactions like skin rash, itching or hives, swelling of the face, lips, or tongue -breathing problems -dark urine -general ill feeling or flu-like symptoms -problems with balance, talking, walking -unusually weak or tired -worsening of mood, thoughts or actions of suicide or dying -yellowing of the eyes or skin Side effects that usually do not require medical attention (report to your doctor or health care professional if they continue or are bothersome): -diarrhea -dizzy, drowsy -headache -loss of appetite This list may not describe all possible side effects. Call your doctor for medical advice about side effects. You may report side effects to FDA at 1-800-FDA-1088. Where should I keep my medicine? Keep out of reach of children. Store at room temperature between 15 and 30 degrees C (59 and 86 degrees F). Throw away any unused medicine after the expiration date. NOTE: This sheet is a summary. It may not cover all possible information. If you have questions about this medicine, talk to your doctor, pharmacist, or health care provider.  2018 Elsevier/Gold Standard (2015-05-24 09:43:54)  

## 2018-04-13 ENCOUNTER — Other Ambulatory Visit: Payer: Self-pay | Admitting: Internal Medicine

## 2018-04-13 DIAGNOSIS — Z1231 Encounter for screening mammogram for malignant neoplasm of breast: Secondary | ICD-10-CM

## 2018-04-14 DIAGNOSIS — I119 Hypertensive heart disease without heart failure: Secondary | ICD-10-CM | POA: Insufficient documentation

## 2018-05-21 ENCOUNTER — Ambulatory Visit
Admission: RE | Admit: 2018-05-21 | Discharge: 2018-05-21 | Disposition: A | Discharge status: Home / Self Care | Payer: Medicare Other | Source: Ambulatory Visit | Attending: Internal Medicine | Admitting: Internal Medicine

## 2018-05-21 DIAGNOSIS — Z1231 Encounter for screening mammogram for malignant neoplasm of breast: Secondary | ICD-10-CM | POA: Insufficient documentation

## 2018-06-05 ENCOUNTER — Other Ambulatory Visit
Admission: RE | Admit: 2018-06-05 | Discharge: 2018-06-05 | Disposition: A | Discharge status: Home / Self Care | Payer: Medicare Other | Source: Ambulatory Visit | Attending: Nephrology | Admitting: Nephrology

## 2018-06-05 DIAGNOSIS — N186 End stage renal disease: Secondary | ICD-10-CM | POA: Insufficient documentation

## 2018-06-05 LAB — POTASSIUM: Potassium: 5.2 mmol/L — ABNORMAL HIGH (ref 3.5–5.1)

## 2018-06-09 ENCOUNTER — Other Ambulatory Visit (INDEPENDENT_AMBULATORY_CARE_PROVIDER_SITE_OTHER): Payer: Self-pay | Admitting: Vascular Surgery

## 2018-06-09 ENCOUNTER — Encounter (INDEPENDENT_AMBULATORY_CARE_PROVIDER_SITE_OTHER): Payer: Self-pay | Admitting: Vascular Surgery

## 2018-06-09 ENCOUNTER — Ambulatory Visit (INDEPENDENT_AMBULATORY_CARE_PROVIDER_SITE_OTHER): Payer: Medicare Other

## 2018-06-09 ENCOUNTER — Ambulatory Visit (INDEPENDENT_AMBULATORY_CARE_PROVIDER_SITE_OTHER): Payer: Medicare Other | Admitting: Vascular Surgery

## 2018-06-09 VITALS — BP 137/69 | HR 69 | Resp 16 | Ht 62.0 in | Wt 211.8 lb

## 2018-06-09 DIAGNOSIS — N186 End stage renal disease: Secondary | ICD-10-CM

## 2018-06-09 DIAGNOSIS — D638 Anemia in other chronic diseases classified elsewhere: Secondary | ICD-10-CM | POA: Diagnosis not present

## 2018-06-09 DIAGNOSIS — Z992 Dependence on renal dialysis: Secondary | ICD-10-CM

## 2018-06-09 NOTE — Progress Notes (Signed)
Subjective:    Patient ID: Nina Johnston, female    DOB: 03/17/1961, 58 y.o.   MRN: [id number] Chief Complaint  Patient presents with  . Follow-up   Patient presents for a 63-month post procedure follow-up.  Patient is status post a 1.   Left radiocephalic arteriovenous fistula cannulation under ultrasound guidance 2.   Left arm fistulagram including central venogram 3.   Catheter placement into left brachial artery from a retrograde approach through the fistula and left upper extremity arteriogram 4.   Percutaneous transluminal angioplasty of the radiocephalic anastomosis including the radial artery and the cephalic vein just proximal and distal to the anastomosis with a 5 mm diameter by 6 cm length Lutonix drug-coated angioplasty balloon She tolerated the procedure well and her post procedure course was unremarkable.  The patient presents today for her first post procedure HDA. The patient underwent a duplex ultrasound of the AV access which was notable for a patent fistula without any significant hemodynamic stenosis. Total flow volume was 755. The patient denies any issues with hemodialysis such as cannulation problems, increased bleeding, decrease in doppler flow or recirculation. The patient also denies any fistula skin breakdown, pain, edema, pallor or ulceration of the arm / hand.  The patient denies any uremic symptoms.  Patient denies any fever, nausea vomiting.  Review of Systems  Constitutional: Negative.   HENT: Negative.   Eyes: Negative.   Respiratory: Negative.   Cardiovascular: Negative.   Gastrointestinal: Negative.   Endocrine: Negative.   Genitourinary: Negative.   Musculoskeletal: Negative.   Skin: Negative.   Allergic/Immunologic: Negative.   Neurological: Negative.   Hematological: Negative.   Psychiatric/Behavioral: Negative.       Objective:   Physical Exam Vitals signs reviewed.  Constitutional:      Appearance: Normal appearance. She is obese.  HENT:      Head: Normocephalic and atraumatic.     Right Ear: External ear normal.     Left Ear: External ear normal.     Nose: Nose normal.     Mouth/Throat:     Mouth: Mucous membranes are moist.     Pharynx: Oropharynx is clear.  Eyes:     Extraocular Movements: Extraocular movements intact.     Conjunctiva/sclera: Conjunctivae normal.     Pupils: Pupils are equal, round, and reactive to light.  Neck:     Musculoskeletal: Normal range of motion.  Cardiovascular:     Rate and Rhythm: Normal rate and regular rhythm.     Pulses: Normal pulses.     Heart sounds: Normal heart sounds.     Comments: Left upper extremity dialysis access: Good bruit and thrill.  Skin is intact.  Hand is warm.  2+ radial pulse. Pulmonary:     Effort: Pulmonary effort is normal.     Breath sounds: Normal breath sounds.  Musculoskeletal: Normal range of motion.  Skin:    General: Skin is warm and dry.  Neurological:     General: No focal deficit present.     Mental Status: She is alert and oriented to person, place, and time. Mental status is at baseline.  Psychiatric:        Mood and Affect: Mood normal.        Behavior: Behavior normal.        Thought Content: Thought content normal.        Judgment: Judgment normal.    BP 137/69 (BP Location: Right Arm, Patient Position: Sitting, Cuff Size: Normal)  Pulse 69   Resp 16   Ht 5\' 2"  (1.575 m)   Wt 211 lb 12.8 oz (96.1 kg)   BMI 38.74 kg/m   Past Medical History:  Diagnosis Date  . A-V fistula ([place])   . Acute bacterial sinusitis 04/19/2014  . Adaptive colitis   . Anginal pain ([place])   . Anxiety   . Anxiety and depression   . Arthritis   . Asthma   . 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 Benign essential HTN 12/26/2014  . Bipolar disorder ([place])   . Bronchitis   . Carotid artery narrowing 12/22/2014  . Cervical prolapse   . CHF (congestive heart failure) ([place])   . CKD  (chronic kidney disease)   . Concussion 01-14-14   Fall   . COPD (chronic obstructive pulmonary disease) ([place])   . Coronary artery disease   . Depression   . Diabetes mellitus without complication ([place])   . Dialysis patient ([place])   . Diplopia   . Dupuytren's contracture of foot   . Essential (primary) hypertension 02/12/2015  . Family history of thyroid problem   . GERD (gastroesophageal reflux disease)   . Gout   . Heart disease    enlarged because of COPD  . Heart murmur   . Hepatomegaly   . HLD (hyperlipidemia)   . Hyperactive airway disease   . Hyperkalemia   . Hypertension   . Hyperthyroidism   . Irritable colon   . Morbid obesity ([place])   . Multiple thyroid nodules   . OAB (overactive bladder)   . Pernicious anemia   . Post menopausal syndrome   . Post-concussion syndrome 03/08/2014  . Post-traumatic brain syndrome   . Prolapsed uterus   . PSVT (paroxysmal supraventricular tachycardia) ([place])   . Renal calculus   . Renal disease    w/ GFR 29-may be due to diabetes  . Seizures ([place])    last seizure 2012. has had spells since with no knowledge. last was 1 month ago  . Sleep apnea   . 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 Transient alteration of awareness 11/10/2012  . Urinary incontinence with continuous leakage 03/08/2014   Social History   Socioeconomic History  . Marital status: Single    Spouse name: Not on file  . Number of children: 0  . Years of education:  college  . Highest education level: Not on file  Occupational History  . 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 Financial resource strain: Not very hard  . Food insecurity:    Worry: Sometimes true    Inability: Sometimes true  . Transportation needs:    Medical: No    Non-medical: No  Tobacco Use  . Smoking status: Never Smoker  . Smokeless tobacco: Never Used  Substance and Sexual Activity  . Alcohol use: No  . Drug use: No  . Sexual activity:  Not Currently  Lifestyle  . Physical activity:    Days per week: 0 days    Minutes per session: Not on file  . Stress: Not at all  Relationships  . Social connections:    Talks on phone: More than three times a week    Gets together: More than three times a week    Attends religious service: More than 4 times per year    Active member of  club or organization: Yes    Attends meetings of clubs or organizations: 1 to 4 times per year    Relationship status: Divorced  . Intimate partner violence:    Fear of current or ex partner: No    Emotionally abused: No    Physically abused: No    Forced sexual activity: No  Other Topics Concern  . Not on file  Social History Narrative   Patient is single and lives with her parents.   Patient is disabled.   Patient has a college education.   Patient is right- handed.   Patient drinks tea occasionally. 2 glasses of tea when they eat out.   Past Surgical History:  Procedure Laterality Date  . A/V FISTULAGRAM Left 07/21/2016   Procedure: A/V Fistulagram;  Surgeon: Algernon Huxley, MD;  Location: [facility];  Service: Cardiovascular;  Laterality: Left;  . A/V FISTULAGRAM Left 12/24/2016   Procedure: A/V Fistulagram;  Surgeon: Algernon Huxley, MD;  Location: [facility];  Service: Cardiovascular;  Laterality: Left;  . A/V FISTULAGRAM Left 06/29/2017   Procedure: A/V FISTULAGRAM;  Surgeon: Algernon Huxley, MD;  Location: [facility];  Service: Cardiovascular;  Laterality: Left;  . A/V FISTULAGRAM Left 03/08/2018   Procedure: A/V FISTULAGRAM;  Surgeon: Algernon Huxley, MD;  Location: [facility];  Service: Cardiovascular;  Laterality: Left;  . A/V SHUNT INTERVENTION N/A 07/21/2016   Procedure: A/V Shunt Intervention;  Surgeon: Algernon Huxley, MD;  Location: [facility];  Service: Cardiovascular;  Laterality: N/A;  . A/V SHUNT INTERVENTION N/A 12/24/2016   Procedure: A/V SHUNT INTERVENTION;  Surgeon: Algernon Huxley, MD;   Location: [facility];  Service: Cardiovascular;  Laterality: N/A;  . AVF    . CHOLECYSTECTOMY    . COLONOSCOPY    . COLONOSCOPY WITH PROPOFOL N/A 03/18/2017   Procedure: COLONOSCOPY WITH PROPOFOL;  Surgeon: Manya Silvas, MD;  Location: [facility] END[facility]COPY;  Service: Endoscopy;  Laterality: N/A;  . ENDOMETRIAL BIOPSY     ablation, uterine  . ESOPHAGOGASTRODUODENOSCOPY (EGD) WITH PROPOFOL N/A 03/18/2017   Procedure: ESOPHAGOGASTRODUODENOSCOPY (EGD) WITH PROPOFOL;  Surgeon: Manya Silvas, MD;  Location: [facility] ENDOSCOPY;  Service: Endoscopy;  Laterality: N/A;  . HERNIA REPAIR    . HERNIA REPAIR    . PERIPHERAL VASCULAR CATHETERIZATION N/A 08/23/2015   Procedure: Dialysis/Perma Catheter Insertion;  Surgeon: Algernon Huxley, MD;  Location: [facility];  Service: Cardiovascular;  Laterality: N/A;  . PERIPHERAL VASCULAR CATHETERIZATION N/A 11/09/2015   Procedure: Dialysis/Perma Catheter Removal;  Surgeon: Katha Cabal, MD;  Location: [facility];  Service: Cardiovascular;  Laterality: N/A;  . PERIPHERAL VASCULAR CATHETERIZATION Left 04/23/2016   Procedure: A/V Fistulagram;  Surgeon: Algernon Huxley, MD;  Location: [facility];  Service: Cardiovascular;  Laterality: Left;  . PORT A CATH REVISION     Family History  Problem Relation Age of Onset  . Diabetes Mother   . Lumbar disc disease Mother   . Migraines Mother   . Hypertension Mother   . Cancer - Prostate Father        mild  . Diabetes Father   . Hypertension Father   . Breast cancer Neg Hx   . Ovarian cancer Neg Hx   . Colon cancer Neg Hx   . Heart disease Neg Hx   . Kidney cancer Neg Hx   . Bladder Cancer Neg Hx    Allergies  Allergen Reactions  . Cinnamon Other (See Comments)    Raises blood pressure  . Procrit [Epoetin Alfa] Other (See Comments)    Seizures, grand mal  . Ace Inhibitors Cough  . Azithromycin Other (See Comments)    seizures  . Iron Other (See Comments)    Can not take in  IV Form - causes seizures   . Neomycin-Bacitracin Zn-Polymyx Other (See Comments)    Unknown  . Sulfa Antibiotics Other (See Comments)    Due to risks for seizures.      Assessment & Plan:  Patient presents for a 73-month post procedure follow-up.  Patient is status post a 1.   Left radiocephalic arteriovenous fistula cannulation under ultrasound guidance 2.   Left arm fistulagram including central venogram 3.   Catheter placement into left brachial artery from a retrograde approach through the fistula and left upper extremity arteriogram 4.   Percutaneous transluminal angioplasty of the radiocephalic anastomosis including the radial artery and the cephalic vein just proximal and distal to the anastomosis with a 5 mm diameter by 6 cm length Lutonix drug-coated angioplasty balloon She tolerated the procedure well and her post procedure course was unremarkable.  The patient presents today for her first post procedure HDA. The patient underwent a duplex ultrasound of the AV access which was notable for a patent fistula without any significant hemodynamic stenosis. Total flow volume was 755. The patient denies any issues with hemodialysis such as cannulation problems, increased bleeding, decrease in doppler flow or recirculation. The patient also denies any fistula skin breakdown, pain, edema, pallor or ulceration of the arm / hand.  The patient denies any uremic symptoms.  Patient denies any fever, nausea vomiting.  1. ESRD on dialysis ([employer]) - Stable Patient presents for her first post procedure follow-up Studies reviewed with patient. The patient is doing well and currently has adequate dialysis access. Duplex ultrasound of the AV access shows a patent access with no evidence of hemodynamically significant strictures or stenosis.  The patient should continue to have duplex ultrasounds of the dialysis access every six months. The patient was instructed to call the office in the interim if any issues  with dialysis access / doppler flow, pain, edema, pallor, fistula skin breakdown or ulceration of the arm / hand occur. The patient expressed their understanding.  - VAS US DUPLEX DIALYSIS ACCESS (AVF,AVG); Future  2. Anemia in chronic illness - Stable Followed by the patient's nephrologist and primary care physician  Current Outpatient Medications on File Prior to Visit  Medication Sig Dispense Refill  . aspirin 81 MG tablet Take 81 mg by mouth daily.     Marland Kitchen b complex vitamins tablet Take 1 tablet by mouth at bedtime.     . B Complex-C-Folic Acid (DIALYVITE TABLET) TABS Take 1 tablet by mouth daily.  12  . benzonatate (TESSALON) 200 MG capsule Take by mouth.    . clonazePAM (KLONOPIN) 1 MG tablet TAKE ONE TABLET BY MOUTH AT BEDTIME 30 tablet 3  . cyanocobalamin (,VITAMIN B-12,) 1000 MCG/ML injection Inject 1,000 mcg into the muscle every 14 (fourteen) days.     Marland Kitchen epoetin alfa (EPOGEN,PROCRIT) 3000 UNIT/ML injection Inject 3,000 Units into the vein every Monday, Wednesday, and Friday.    . ethosuximide (ZARONTIN) 250 MG/5ML solution TAKE 5ML BY MOUTH 2 TIMES DAILY 474 mL 3  . famotidine (PEPCID) 40 MG tablet Take by mouth.    . Febuxostat 80 MG TABS Take 40 mg by mouth daily.    Marland Kitchen  ferric citrate (AURYXIA) 1 GM 210 MG(Fe) tablet Take by mouth 3 (three) times daily with meals.     . fexofenadine (ALLEGRA) 180 MG tablet Take 180 mg by mouth daily.     . Fluticasone-Salmeterol (ADVAIR DISKUS) 250-50 MCG/DOSE AEPB Inhale 1 puff into the lungs 2 (two) times daily.    . folic acid-vitamin b complex-vitamin c-selenium-zinc (DIALYVITE) 3 MG TABS tablet Take 1 tablet by mouth daily.    . furosemide (LASIX) 20 MG tablet Take 30 mg by mouth 2 (two) times daily.     . hydrALAZINE (APRESOLINE) 100 MG tablet Take 100 mg by mouth See admin instructions. Take 100 mg by mouth twice daily on non-dialysis days. Take 100 mg by mouth at bedtime on dialysis days Tuesday, Thursday and Saturday    . insulin glargine  (LANTUS) 100 UNIT/ML injection Inject 22 Units into the skin at bedtime.     . insulin lispro (HUMALOG) 100 UNIT/ML injection Inject 10-15 Units into the skin 3 (three) times daily with meals. Per sliding scale    . LAMICTAL 200 MG tablet TAKE 1 AND 1/2 TABLETS BY MOUTH TWICE A DAY 270 tablet 1  . levETIRAcetam (KEPPRA) 250 MG tablet Take 1 tablet (250 mg total) by mouth 2 (two) times daily. 60 tablet 5  . levothyroxine (SYNTHROID) 150 MCG tablet Take 150 mcg by mouth daily before breakfast.     . metoprolol succinate (TOPROL XL) 50 MG 24 hr tablet Take 1 tablet (50 mg total) by mouth 2 (two) times daily. Take with or immediately following a meal. (Patient taking differently: Take 75 mg by mouth See admin instructions. Take 75 mg by mouth twice daily on non dialysis days. Take 75 mg by mouth at bedtime on dialysis days Tuesday, Thursday and Saturday.) 60 tablet 11  . montelukast (SINGULAIR) 10 MG tablet Take 10 mg by mouth at bedtime.    Marland Kitchen nystatin-triamcinolone (MYCOLOG II) cream Apply 1 application topically 2 (two) times daily. (Patient taking differently: Apply 1 application topically daily as needed (rash). ) 30 g 1  . pantoprazole (PROTONIX) 40 MG tablet Take 40 mg by mouth every morning.     . Probiotic Product (ALIGN) 4 MG CAPS Take 4 mg by mouth daily.     Marland Kitchen triamcinolone (NASACORT ALLERGY 24HR) 55 MCG/ACT AERO nasal inhaler Place 2 sprays into the nose at bedtime.     Marland Kitchen UNIFINE PENTIPS 32G X 4 MM MISC USE UP TO 5 TIMES DAILY AS DIRECTED  3  . amLODipine (NORVASC) 5 MG tablet Take 5 mg by mouth See admin instructions. Take 5 mg by mouth daily except do not take on Tuesday, Thursday and Saturday.     No current facility-administered medications on file prior to visit.    There are no Patient Instructions on file for this visit. No follow-ups on file.  Thales Knipple A Brittanee Ghazarian, PA-C

## 2018-07-01 ENCOUNTER — Telehealth: Payer: Self-pay | Admitting: Neurology

## 2018-07-01 MED ORDER — LEVETIRACETAM 250 MG PO TABS: ORAL_TABLET | ORAL | 5 refills | Status: DC

## 2018-07-01 NOTE — Telephone Encounter (Signed)
Called the patient to advise them that Dr Dohmeier made the change to the patient's medication to allow for her to be able to take the medication 3 times on dialysis days. rx was sent to the pharmacy for the pt.  No answer.   IF patient calls back please let them know the medication was corrected to allow for what she needed. [facility] on [address] for the pt.

## 2018-07-01 NOTE — Telephone Encounter (Signed)
Pts father called requesting more pills to be ordered of the pts levETIRAcetam (KEPPRA) 250 MG tablet than the usual due to the fact that the pt has epileptic spells every Dyalasis days which are Tues, Thurs and Sat. They give her 3 pills a day on her Dyalasis days, one before Dyalasis, one after her Dyalasis and one before bed time instead of the 2 a day stated on the bottle. Please advise.

## 2018-07-01 NOTE — Telephone Encounter (Signed)
Changed to 3 daily Ktab on hemodialysis days.

## 2018-08-16 ENCOUNTER — Ambulatory Visit: Payer: Medicare Other | Admitting: Urology

## 2018-08-16 ENCOUNTER — Ambulatory Visit: Payer: Medicare Other | Admitting: Neurology

## 2018-08-18 ENCOUNTER — Other Ambulatory Visit: Payer: Self-pay | Admitting: Neurology

## 2018-08-18 DIAGNOSIS — R270 Ataxia, unspecified: Secondary | ICD-10-CM

## 2018-08-18 DIAGNOSIS — R4789 Other speech disturbances: Secondary | ICD-10-CM

## 2018-08-18 DIAGNOSIS — R404 Transient alteration of awareness: Secondary | ICD-10-CM

## 2018-08-19 ENCOUNTER — Telehealth: Payer: Self-pay | Admitting: Neurology

## 2018-08-19 NOTE — Telephone Encounter (Signed)
Pt is needing a refill on her clonazePAM (KLONOPIN) 1 MG tablet [facility]

## 2018-08-19 NOTE — Telephone Encounter (Signed)
Attempted to call the home line, it was a busy signal. This script was sent yesterday to the pharmacy mentioned. If she calls back please advise to check with the pharmacy. I have receipt of confirmation that Dr Brett Fairy sent it over at 4:45 pm on 08/18/2018.

## 2018-08-21 ENCOUNTER — Emergency Department
Admission: EM | Admit: 2018-08-21 | Discharge: 2018-08-21 | Disposition: A | Discharge status: Home / Self Care | Payer: Medicare Other | Attending: Emergency Medicine | Admitting: Emergency Medicine

## 2018-08-21 ENCOUNTER — Other Ambulatory Visit: Payer: Self-pay

## 2018-08-21 ENCOUNTER — Emergency Department: Payer: Medicare Other

## 2018-08-21 DIAGNOSIS — Z794 Long term (current) use of insulin: Secondary | ICD-10-CM | POA: Insufficient documentation

## 2018-08-21 DIAGNOSIS — N186 End stage renal disease: Secondary | ICD-10-CM | POA: Insufficient documentation

## 2018-08-21 DIAGNOSIS — E039 Hypothyroidism, unspecified: Secondary | ICD-10-CM | POA: Insufficient documentation

## 2018-08-21 DIAGNOSIS — E1122 Type 2 diabetes mellitus with diabetic chronic kidney disease: Secondary | ICD-10-CM | POA: Insufficient documentation

## 2018-08-21 DIAGNOSIS — R079 Chest pain, unspecified: Secondary | ICD-10-CM | POA: Diagnosis not present

## 2018-08-21 DIAGNOSIS — Z992 Dependence on renal dialysis: Secondary | ICD-10-CM | POA: Insufficient documentation

## 2018-08-21 DIAGNOSIS — I509 Heart failure, unspecified: Secondary | ICD-10-CM | POA: Insufficient documentation

## 2018-08-21 DIAGNOSIS — I132 Hypertensive heart and chronic kidney disease with heart failure and with stage 5 chronic kidney disease, or end stage renal disease: Secondary | ICD-10-CM | POA: Insufficient documentation

## 2018-08-21 LAB — CBC WITH DIFFERENTIAL/PLATELET
Abs Immature Granulocytes: 0.13 10*3/uL — ABNORMAL HIGH (ref 0.00–0.07)
Basophils Absolute: 0.1 10*3/uL (ref 0.0–0.1)
Basophils Relative: 1 %
Eosinophils Absolute: 0.1 10*3/uL (ref 0.0–0.5)
Eosinophils Relative: 1 %
HCT: 31.5 % — ABNORMAL LOW (ref 36.0–46.0)
Hemoglobin: 9.9 g/dL — ABNORMAL LOW (ref 12.0–15.0)
Immature Granulocytes: 2 %
Lymphocytes Relative: 13 %
Lymphs Abs: 1.1 10*3/uL (ref 0.7–4.0)
MCH: 34.1 pg — ABNORMAL HIGH (ref 26.0–34.0)
MCHC: 31.4 g/dL (ref 30.0–36.0)
MCV: 108.6 fL — ABNORMAL HIGH (ref 80.0–100.0)
Monocytes Absolute: 0.4 10*3/uL (ref 0.1–1.0)
Monocytes Relative: 5 %
Neutro Abs: 6.5 10*3/uL (ref 1.7–7.7)
Neutrophils Relative %: 78 %
Platelets: 107 10*3/uL — ABNORMAL LOW (ref 150–400)
RBC: 2.9 MIL/uL — ABNORMAL LOW (ref 3.87–5.11)
RDW: 14.8 % (ref 11.5–15.5)
WBC: 8.2 10*3/uL (ref 4.0–10.5)
nRBC: 0 % (ref 0.0–0.2)

## 2018-08-21 LAB — COMPREHENSIVE METABOLIC PANEL
ALT: 18 U/L (ref 0–44)
AST: 16 U/L (ref 15–41)
Albumin: 4 g/dL (ref 3.5–5.0)
Alkaline Phosphatase: 145 U/L — ABNORMAL HIGH (ref 38–126)
Anion gap: 9 (ref 5–15)
BUN: 34 mg/dL — ABNORMAL HIGH (ref 6–20)
CO2: 27 mmol/L (ref 22–32)
Calcium: 8.3 mg/dL — ABNORMAL LOW (ref 8.9–10.3)
Chloride: 101 mmol/L (ref 98–111)
Creatinine, Ser: 5.05 mg/dL — ABNORMAL HIGH (ref 0.44–1.00)
GFR calc Af Amer: 10 mL/min — ABNORMAL LOW (ref >60)
GFR calc non Af Amer: 9 mL/min — ABNORMAL LOW (ref >60)
Glucose, Bld: 144 mg/dL — ABNORMAL HIGH (ref 70–99)
Potassium: 4.3 mmol/L (ref 3.5–5.1)
Sodium: 137 mmol/L (ref 135–145)
Total Bilirubin: 0.4 mg/dL (ref 0.3–1.2)
Total Protein: 6.4 g/dL — ABNORMAL LOW (ref 6.5–8.1)

## 2018-08-21 LAB — BRAIN NATRIURETIC PEPTIDE: B Natriuretic Peptide: 471 pg/mL — ABNORMAL HIGH (ref 0.0–100.0)

## 2018-08-21 LAB — PHOSPHORUS: Phosphorus: 3.1 mg/dL (ref 2.5–4.6)

## 2018-08-21 LAB — FIBRIN DERIVATIVES D-DIMER (ARMC ONLY): Fibrin derivatives D-dimer (ARMC): 569.99 ng/mL (FEU) — ABNORMAL HIGH (ref 0.00–499.00)

## 2018-08-21 LAB — TROPONIN I
Troponin I: <0.03 ng/mL (ref <0.03)
Troponin I: <0.03 ng/mL (ref <0.03)

## 2018-08-21 MED ORDER — LIDOCAINE-PRILOCAINE 2.5-2.5 % EX CREA: 1.0000 "application " | TOPICAL_CREAM | CUTANEOUS | Status: DC | PRN

## 2018-08-21 MED ORDER — IOHEXOL 350 MG/ML SOLN
75.0000 mL | Freq: Once | INTRAVENOUS | Status: AC | PRN
  Administered 2018-08-21: 75 mL via INTRAVENOUS

## 2018-08-21 MED ORDER — SODIUM CHLORIDE 0.9 % IV SOLN: 100.0000 mL | INTRAVENOUS | Status: DC | PRN

## 2018-08-21 MED ORDER — CHLORHEXIDINE GLUCONATE CLOTH 2 % EX PADS: 6.0000 | MEDICATED_PAD | Freq: Every day | CUTANEOUS | Status: DC

## 2018-08-21 MED ORDER — ALTEPLASE 2 MG IJ SOLR: 2.0000 mg | Freq: Once | INTRAMUSCULAR | Status: DC | PRN

## 2018-08-21 MED ORDER — PENTAFLUOROPROP-TETRAFLUOROETH EX AERO: 1.0000 "application " | INHALATION_SPRAY | CUTANEOUS | Status: DC | PRN

## 2018-08-21 MED ORDER — HEPARIN SODIUM (PORCINE) 1000 UNIT/ML DIALYSIS: 1000.0000 [IU] | INTRAMUSCULAR | Status: DC | PRN

## 2018-08-21 MED ORDER — LIDOCAINE HCL (PF) 1 % IJ SOLN: 5.0000 mL | INTRAMUSCULAR | Status: DC | PRN

## 2018-08-21 NOTE — Progress Notes (Signed)
Hemodialysis treatment completed at 2015. Net UF removed 1250 mL. Blood rinsed back, Needles removed one at a time and manual pressure held to AVF until hemostasis achieved. No active bleeding upon patient departure from acute room. Post hemodialysis report given to S. Kathlen Mody,  RN.    08/21/18 2015  Hand-Off documentation  Report given to (Full Name) Lourdes Sledge, RN  Vital Signs  Temp 98 F (36.7 C)  Temp Source Oral  Pulse Rate 70  BP (!) 147/68  BP Location Right Arm  BP Method Automatic  Patient Position (if appropriate) Sitting  During Hemodialysis Assessment  Intra-Hemodialysis Comments Tx completed  Post-Hemodialysis Assessment  Rinseback Volume (mL) 250 mL  KECN 36.9 V  Dialyzer Clearance Lightly streaked  Duration of HD Treatment -hour(s) 2 hour(s)  Hemodialysis Intake (mL) 500 mL  UF Total -Machine (mL) 1750 mL  Net UF (mL) 1250 mL  Tolerated HD Treatment Yes  AVG/AVF Arterial Site Held (minutes) 10 minutes  AVG/AVF Venous Site Held (minutes) 10 minutes  Education / Care Plan  Dialysis Education Provided Yes  Documented Education in Care Plan Yes  Outpatient Plan of Care Reviewed and on Chart Yes

## 2018-08-21 NOTE — Progress Notes (Signed)
Pre hemodialysis report received from [place], [place]. Received patient in acute via wheelchair. Awake, alert and verbally responsive. No actute distress noted. Two 15 gauge needles remain in place from outpatient dialysis center. Two needles removed and manual pressure held to LUE AVF until hemostasis achieved. + Bruit/thrill noted.  AVF reaccessed with two 15 g needles without difficulty. Hemodialysis treatment initiated at 1814. Plan for 2  hours hemodialysis with UF of 1250 mL as tolerated.

## 2018-08-21 NOTE — ED Notes (Signed)
Pt up to toilet to urinate at this time

## 2018-08-21 NOTE — Progress Notes (Signed)
Pre dialysis   08/21/18 1814  Hand-Off documentation  Report received from (Full Name) Velna Hatchet, RN  Vital Signs  Temp 98.2 F (36.8 C)  Temp Source Oral  Pulse Rate 73  Resp 20  BP (!) 189/65  BP Location Right Arm  BP Method Automatic  Patient Position (if appropriate) Sitting  Pain Assessment  Pain Scale 0-10  Pain Score 0  Time-Out for Hemodialysis  What Procedure? dialysis  Pt Identifiers(min of two) First/Last Name;MRN/Account#  Correct Site? Yes  Correct Side? Yes  Correct Procedure? Yes  Consents Verified? Yes  Rad Studies Available? N/A  Safety Precautions Reviewed? Yes  Engineer, civil (consulting) Number 3  Station Number 2  UF/Alarm Test Passed  Conductivity: Meter 13.8  Conductivity: Machine  13.8  pH 7.4  Reverse Osmosis main  Normal Saline Lot Number [id number]  Dialyzer Lot Number [id number]  Disposable Set Lot Number [id number]  Machine Temperature 98.6 F (37 C)  Musician and Audible Yes  Blood Lines Intact and Secured Yes  Pre Treatment Patient Checks  Vascular access used during treatment Fistula  Hemodialysis Consent Verified Yes  Hemodialysis Standing Orders Initiated Yes  ECG (Telemetry) Monitor On Yes  Prime Ordered Normal Saline  Length of  DialysisTreatment -hour(s) 2 Hour(s)  Dialyzer Elisio 17H NR  Dialysate 2K, 2.5 Ca  Dialysate Flow Ordered 800  Blood Flow Rate Ordered 400 mL/min  Ultrafiltration Goal 1250 Liters  During Hemodialysis Assessment  Blood Flow Rate (mL/min) 400 mL/min  Arterial Pressure (mmHg) -200 mmHg  Venous Pressure (mmHg) 130 mmHg  Transmembrane Pressure (mmHg) 70 mmHg  Ultrafiltration Rate (mL/min) 870 mL/min  Dialysate Flow Rate (mL/min) 800 ml/min  Conductivity: Machine  13.8  Dialysis Fluid Bolus Normal Saline  Bolus Amount (mL) 250 mL (prime)  Intra-Hemodialysis Comments Tx initiated

## 2018-08-21 NOTE — Discharge Instructions (Addendum)

## 2018-08-21 NOTE — ED Notes (Signed)
Patient remains in dialysis.

## 2018-08-21 NOTE — ED Triage Notes (Addendum)
Pt arrives via ems from [facility], ems report pt was receiving dialysis, only completed 5-10 mins prior to pain starting. pt c/o chest pain, radiating to left arm states she feels SOB but is normally but slight more difficulty than normal. Pt a&o x 4 on arrival. NAD noted on arrival.  Ems administered 324 of asprin prior to arrival. Pt dialysis port, still accessed at this time from [facility].

## 2018-08-21 NOTE — ED Provider Notes (Addendum)
_________________________ 4:24 PM on 08/21/2018 -----------------------------------------  2nd troponin negative. CT negative for acute findings. Spoke with Dr. Holley Raring from nephrology who will dialyse patient prior to discharge  _________________________ 9:19 PM on 08/21/2018 -----------------------------------------  Patient back from dialysis.  Remains with no chest pain.  At this time will be discharged home to the care of her father.   Alfred Levins, [place], MD 08/21/18 [place], [place], MD 08/21/18 2120

## 2018-08-21 NOTE — ED Notes (Signed)
Pt transported to CT ?

## 2018-08-21 NOTE — ED Provider Notes (Addendum)
[facility] Emergency Department Provider Note   ____________________________________________   First MD Initiated Contact with Patient 08/21/18 1218     (approximate)  I have reviewed the triage vital signs and the nursing notes.   HISTORY  Chief Complaint Chest Pain    HPI Nina Johnston is a 58 y.o. female who reports she was 5 to 10 minutes into her dialysis treatment when she developed sharp left-sided chest pain that radiated into the left arm.  The pain is pleuritic.  It is not accompanied by sweating or nausea.  She had a slight increase in her baseline shortness of breath with it.  Pain is exactly reproduced by palpation.         Past Medical History:  Diagnosis Date  . A-V fistula ([place])   . Acute bacterial sinusitis 04/19/2014  . Adaptive colitis   . Anginal pain ([place])   . Anxiety   . Anxiety and depression   . Arthritis   . Asthma   . 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 Benign essential HTN 12/26/2014  . Bipolar disorder ([place])   . Bronchitis   . Carotid artery narrowing 12/22/2014  . Cervical prolapse   . CHF (congestive heart failure) ([place])   . CKD (chronic kidney disease)   . Concussion 01-14-14   Fall   . COPD (chronic obstructive pulmonary disease) ([place])   . Coronary artery disease   . Depression   . Diabetes mellitus without complication ([place])   . Dialysis patient ([place])   . Diplopia   . Dupuytren's contracture of foot   . Essential (primary) hypertension 02/12/2015  . Family history of thyroid problem   . GERD (gastroesophageal reflux disease)   . Gout   . Heart disease    enlarged because of COPD  . Heart murmur   . Hepatomegaly   . HLD (hyperlipidemia)   . Hyperactive airway disease   . Hyperkalemia   . Hypertension   . Hyperthyroidism   . Irritable colon   . Morbid obesity ([place])   . Multiple thyroid nodules   . OAB (overactive  bladder)   . Pernicious anemia   . Post menopausal syndrome   . Post-concussion syndrome 03/08/2014  . Post-traumatic brain syndrome   . Prolapsed uterus   . PSVT (paroxysmal supraventricular tachycardia) ([place])   . Renal calculus   . Renal disease    w/ GFR 29-may be due to diabetes  . Seizures ([place])    last seizure 2012. has had spells since with no knowledge. last was 1 month ago  . Sleep apnea   . 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 Transient alteration of awareness 11/10/2012  . Urinary incontinence with continuous leakage 03/08/2014    Patient Active Problem List   Diagnosis Date Noted  . Partial symptomatic epilepsy with complex partial seizures, not intractable, without status epilepticus ([place]) 04/07/2018  . Diabetes mellitus with ESRD (end-stage renal disease) ([place]) 04/07/2018  . Spells of decreased attentiveness 12/02/2017  . Postdialysis syndrome 12/02/2017  . 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[place]) 12/02/2017  . Anemia due to pre-ESRD treated with erythropoietin 09/23/2017  . Hemodialysis access, AV graft ([place]) 09/23/2017  . ESRD on dialysis ([place]) 02/06/2017  . Delay kidney tx func d/t fluid overload requiring acute dialysis ([place]) 12/03/2016  . Menopause 10/22/2016  . Status post endometrial  ablation 10/22/2016  . Swelling of limb 04/18/2016  . Kidney dialysis as the cause of abnormal reaction of the patient, or of later complication, without mention of misadventure at the time of the procedure (CODE) 04/18/2016  . End-stage renal disease on hemodialysis ([place]) 08/24/2015  . OAB (overactive bladder) 06/07/2015  . Incontinence 06/07/2015  . Airway hyperreactivity 06/06/2015  . Binocular vision disorder with diplopia 06/06/2015  . Type 2 diabetes mellitus ([place]) 06/01/2015  . Severe diabetic hypoglycemia ([place]) 04/16/2015  . Convulsions ([place]) 02/12/2015  . Hypoglycemic reaction 02/12/2015  . Other symptoms and signs concerning food and  fluid intake 02/12/2015  . Absence of menstruation 02/12/2015  . Absolute anemia 02/12/2015  . Post menopausal syndrome 02/12/2015  . Calculus of kidney 02/12/2015  . Chronic obstructive pulmonary disease ([place]) 02/12/2015  . Bone/cartilage disorder 02/12/2015  . Urinary system disease 02/12/2015  . Encounter for gynecological examination without abnormal finding 02/12/2015  . 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 Essential (primary) hypertension 02/12/2015  . Gout 02/12/2015  . Hernia, internal 02/12/2015  . Hypoglycemia 02/12/2015  . Adaptive colitis 02/12/2015  . Arthritis, degenerative 02/12/2015  . Postprocedural state 02/12/2015  . Dupuytren's contracture of foot 02/12/2015  . Cutaneous eruption 02/12/2015  . 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 Absence of bladder continence 02/12/2015  . Cervical prolapse 02/12/2015  . Seizure ([place]) 02/12/2015  . Benign essential HTN 12/26/2014  . Carotid artery narrowing 12/22/2014  . Chest pain 12/22/2014  . Combined fat and carbohydrate induced hyperlipemia 12/22/2014  . Acute bacterial sinusitis 04/19/2014  . Anemia in chronic illness 03/16/2014  . Urinary incontinence with continuous leakage 03/08/2014  . Post-concussion syndrome 03/08/2014  . Total urinary incontinence 03/08/2014  . 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 Apnea, sleep 01/17/2014  . Patient awaiting renal transplant 06/03/2013  . Bipolar affective disorder ([place]) 12/16/2012  . Acid reflux 12/16/2012  . Class 2 severe obesity due to excess calories with serious comorbidity and body mass index (BMI) of 37.0 to 37.9 in adult ([place]) 12/16/2012  . Obstructive apnea 12/16/2012  . Addison anemia 12/16/2012  . Seizure disorder ([place]) 12/16/2012  . Ataxia 11/10/2012  . 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 Transient alteration of awareness 11/10/2012  . Severe obesity (BMI >= 40) ([place]) 11/10/2012  . Morbid obesity ([place])  11/10/2012  . Speech disorder 11/10/2012  . Encounter for general adult medical examination without abnormal findings 09/08/2012  . Multinodular goiter 01/29/2012  . Cardiac murmur 10/29/2011  . High potassium 10/28/2011  . Adult hypothyroidism 03/12/2011    Past Surgical History:  Procedure Laterality Date  . A/V FISTULAGRAM Left 07/21/2016   Procedure: A/V Fistulagram;  Surgeon: Algernon Huxley, MD;  Location: [place] [facility];  Service: Cardiovascular;  Laterality: Left;  . A/V FISTULAGRAM Left 12/24/2016   Procedure: A/V Fistulagram;  Surgeon: Algernon Huxley, MD;  Location: [place] [facility];  Service: Cardiovascular;  Laterality: Left;  . A/V FISTULAGRAM Left 06/29/2017   Procedure: A/V FISTULAGRAM;  Surgeon: Algernon Huxley, MD;  Location: [place] [facility];  Service: Cardiovascular;  Laterality: Left;  . A/V FISTULAGRAM Left 03/08/2018   Procedure: A/V FISTULAGRAM;  Surgeon: Algernon Huxley, MD;  Location: [place] [facility];  Service: Cardiovascular;  Laterality: Left;  . A/V SHUNT INTERVENTION N/A 07/21/2016   Procedure: A/V Shunt Intervention;  Surgeon: Algernon Huxley, MD;  Location: [place] [facility];  Service: Cardiovascular;  Laterality: N/A;  .  A/V SHUNT INTERVENTION N/A 12/24/2016   Procedure: A/V SHUNT INTERVENTION;  Surgeon: Algernon Huxley, MD;  Location: [place] [facility];  Service: Cardiovascular;  Laterality: N/A;  . AVF    . CHOLECYSTECTOMY    . COLONOSCOPY    . COLONOSCOPY WITH PROPOFOL N/A 03/18/2017   Procedure: COLONOSCOPY WITH PROPOFOL;  Surgeon: Manya Silvas, MD;  Location[facility] ENDOSCOPY;  Service: Endoscopy;  Laterality: N/A;  . ENDOMETRIAL BIOPSY     ablation, uterine  . ESOPHAGOGASTRODUODENOSCOPY (EGD) WITH PROPOFOL N/A 03/18/2017   Procedure: ESOPHAGOGASTRODUODENOSCOPY (EGD) WITH PROPOFOL;  Surgeon: Manya Silvas, MD;  Location: [facility] ENDOSCOPY;  Service: Endoscopy;  Laterality: N/A;  . HERNIA REPAIR    . HERNIA REPAIR    . PERIPHERAL VASCULAR  CATHETERIZATION N/A 08/23/2015   Procedure: Dialysis/Perma Catheter Insertion;  Surgeon: Algernon Huxley, MD;  Location: [place] [facility];  Service: Cardiovascular;  Laterality: N/A;  . PERIPHERAL VASCULAR CATHETERIZATION N/A 11/09/2015   Procedure: Dialysis/Perma Catheter Removal;  Surgeon: Katha Cabal, MD;  Location: [place] [facility];  Service: Cardiovascular;  Laterality: N/A;  . PERIPHERAL VASCULAR CATHETERIZATION Left 04/23/2016   Procedure: A/V Fistulagram;  Surgeon: Algernon Huxley, MD;  Location: [place] [facility];  Service: Cardiovascular;  Laterality: Left;  . PORT A CATH REVISION      Prior to Admission medications   Medication Sig Start Date End Date Taking? Authorizing Provider  amLODipine (NORVASC) 5 MG tablet Take 5 mg by mouth See admin instructions. Take 5 mg by mouth daily except do not take on Tuesday, Thursday and Saturday. 11/26/16 03/08/18  [provider]  aspirin 81 MG tablet Take 81 mg by mouth daily.     [provider]  b complex vitamins tablet Take 1 tablet by mouth at bedtime.     [provider]  B Complex-C-Folic Acid (DIALYVITE TABLET) TABS Take 1 tablet by mouth daily. 11/14/17   [provider]  benzonatate (TESSALON) 200 MG capsule Take by mouth. 10/30/17   [provider]  clonazePAM (KLONOPIN) 1 MG tablet TAKE 1 TABLET BY MOUTH TWICE A DAY 08/18/18   Dohmeier, Asencion Partridge, MD  cyanocobalamin (,VITAMIN B-12,) 1000 MCG/ML injection Inject 1,000 mcg into the muscle every 14 (fourteen) days.     [provider]  epoetin alfa (EPOGEN,PROCRIT) 3000 UNIT/ML injection Inject 3,000 Units into the vein every Monday, Wednesday, and Friday.    [provider]  ethosuximide (ZARONTIN) 250 MG/5ML solution TAKE 5ML BY MOUTH 2 TIMES DAILY 04/07/18   Dohmeier, Asencion Partridge, MD  famotidine (PEPCID) 40 MG tablet Take by mouth. 09/08/17 09/08/18  [provider]  Febuxostat 80 MG TABS Take 40 mg by mouth daily.     [provider]  ferric citrate (AURYXIA) 1 GM 210 MG(Fe) tablet Take by mouth 3 (three) times daily with meals.     [provider]  fexofenadine (ALLEGRA) 180 MG tablet Take 180 mg by mouth daily.     [provider]  Fluticasone-Salmeterol (ADVAIR DISKUS) 250-50 MCG/DOSE AEPB Inhale 1 puff into the lungs 2 (two) times daily.    [provider]  folic acid-vitamin b complex-vitamin c-selenium-zinc (DIALYVITE) 3 MG TABS tablet Take 1 tablet by mouth daily.    [provider]  furosemide (LASIX) 20 MG tablet Take 30 mg by mouth 2 (two) times daily.     [provider]  hydrALAZINE (APRESOLINE) 100 MG tablet Take 100 mg by mouth See admin instructions. Take 100 mg  by mouth twice daily on non-dialysis days. Take 100 mg by mouth at bedtime on dialysis days Tuesday, Thursday and Saturday    [provider]  insulin glargine (LANTUS) 100 UNIT/ML injection Inject 22 Units into the skin at bedtime.     [provider]  insulin lispro (HUMALOG) 100 UNIT/ML injection Inject 10-15 Units into the skin 3 (three) times daily with meals. Per sliding scale    [provider]  LAMICTAL 200 MG tablet TAKE 1 AND 1/2 TABLETS BY MOUTH TWICE A DAY 03/17/18   Dohmeier, Asencion Partridge, MD  levETIRAcetam (KEPPRA) 250 MG tablet Take Bid on non -dialysis days, 3 a day on hemodialysis days. 07/01/18   Dohmeier, Asencion Partridge, MD  levothyroxine (SYNTHROID) 150 MCG tablet Take 150 mcg by mouth daily before breakfast.     [provider]  metoprolol succinate (TOPROL XL) 50 MG 24 hr tablet Take 1 tablet (50 mg total) by mouth 2 (two) times daily. Take with or immediately following a meal. Patient taking differently: Take 75 mg by mouth See admin instructions. Take 75 mg by mouth twice daily on non dialysis days. Take 75 mg by mouth at bedtime on dialysis days Tuesday, Thursday and Saturday. 04/16/16   Dohmeier, Asencion Partridge, MD  montelukast (SINGULAIR) 10 MG tablet  Take 10 mg by mouth at bedtime.    [provider]  nystatin-triamcinolone (MYCOLOG II) cream Apply 1 application topically 2 (two) times daily. Patient taking differently: Apply 1 application topically daily as needed (rash).  10/22/16   Defrancesco, Alanda Slim, MD  pantoprazole (PROTONIX) 40 MG tablet Take 40 mg by mouth every morning.     [provider]  Probiotic Product (ALIGN) 4 MG CAPS Take 4 mg by mouth daily.     [provider]  triamcinolone (NASACORT ALLERGY 24HR) 55 MCG/ACT AERO nasal inhaler Place 2 sprays into the nose at bedtime.     [provider]  UNIFINE PENTIPS 32G X 4 MM MISC USE UP TO 5 TIMES DAILY AS DIRECTED 11/22/17   [provider]    Allergies Cinnamon; Procrit [epoetin alfa]; Ace inhibitors; Azithromycin; Iron; Neomycin-bacitracin zn-polymyx; and Sulfa antibiotics  Family History  Problem Relation Age of Onset  . Diabetes Mother   . Lumbar disc disease Mother   . Migraines Mother   . Hypertension Mother   . Cancer - Prostate Father        mild  . Diabetes Father   . Hypertension Father   . Breast cancer Neg Hx   . Ovarian cancer Neg Hx   . Colon cancer Neg Hx   . Heart disease Neg Hx   . Kidney cancer Neg Hx   . Bladder Cancer Neg Hx     Social History Social History   Tobacco Use  . Smoking status: Never Smoker  . Smokeless tobacco: Never Used  Substance Use Topics  . Alcohol use: No  . Drug use: No    Review of Systems  Constitutional: No fever/chills Eyes: No visual changes. ENT: No sore throat. Cardiovascular: See HPI Respiratory: See HPI. Gastrointestinal: No abdominal pain.  No nausea, no vomiting.  No diarrhea.  No constipation. Genitourinary: Negative for dysuria. Musculoskeletal: Negative for back pain. Skin: Negative for rash. Neurological: Negative for headaches, focal weakness  ____________________________________________   PHYSICAL EXAM:  VITAL SIGNS: ED Triage Vitals  [08/21/18 1215]  Enc Vitals Group     BP      Pulse      Resp  Temp      Temp src      SpO2 96 %     Weight      Height      Head Circumference      Peak Flow      Pain Score      Pain Loc      Pain Edu?      Excl. in [place]?     Constitutional: Alert and oriented. Well appearing and in no acute distress. Eyes: Conjunctivae are normal. Head: Atraumatic. Nose: No congestion/rhinnorhea. Mouth/Throat: Mucous membranes are moist.  Oropharynx non-erythematous. Neck: No stridor. Cardiovascular: Normal rate, regular rhythm. Grossly normal heart sounds.  Good peripheral circulation. Respiratory: Normal respiratory effort.  No retractions. Lungs CTAB.  Palpation of the left chest exactly reproduces her pain. Gastrointestinal: Soft and nontender. No distention. No abdominal bruits.  Musculoskeletal: No lower extremity tenderness 1+ bilateral edema.   Neurologic:  Normal speech and language. No gross focal neurologic deficits are appreciated. Skin:  Skin is warm, dry and intact. No rash noted.   ____________________________________________   LABS (all labs ordered are listed, but only abnormal results are displayed)  Labs Reviewed  COMPREHENSIVE METABOLIC PANEL - Abnormal; Notable for the following components:      Result Value   Glucose, Bld 144 (*)    BUN 34 (*)    Creatinine, Ser 5.05 (*)    Calcium 8.3 (*)    Total Protein 6.4 (*)    Alkaline Phosphatase 145 (*)    GFR calc non Af Amer 9 (*)    GFR calc Af Amer 10 (*)    All other components within normal limits  FIBRIN DERIVATIVES D-DIMER ([facility] ONLY) - Abnormal; Notable for the following components:   Fibrin derivatives D-dimer ([facility]) 569.99 (*)    All other components within normal limits  CBC WITH DIFFERENTIAL/PLATELET - Abnormal; Notable for the following components:   RBC 2.90 (*)    Hemoglobin 9.9 (*)    HCT 31.5 (*)    MCV 108.6 (*)    MCH 34.1 (*)    Platelets 107 (*)    Abs Immature Granulocytes 0.13 (*)     All other components within normal limits  BRAIN NATRIURETIC PEPTIDE - Abnormal; Notable for the following components:   B Natriuretic Peptide 471.0 (*)    All other components within normal limits  TROPONIN I  TROPONIN I   ____________________________________________  EKG EKG read interpreted by me shows sinus rhythm at 69 normal axis  ____________________________________________  RADIOLOGY  ED MD interpretation: Dust x-ray read by radiology reviewed by me shows some CHF.  Official radiology report(s): Dg Chest Portable 1 View  Result Date: 08/21/2018 CLINICAL DATA:  Chest pain during dialysis. EXAM: PORTABLE CHEST 1 VIEW COMPARISON:  April 10, 2016 FINDINGS: Stable cardiomegaly. The hila and mediastinum are normal. No pneumothorax. Increased interstitial markings in the lungs. More focal opacity in the medial right lung base. IMPRESSION: 1. Cardiomegaly and pulmonary venous congestion/mild edema. 2. More focal opacity in the medial right lung base favored represent atelectasis or vascular crowding. Electronically Signed   By: Dorise Bullion III M.D   On: 08/21/2018 12:48    ____________________________________________   PROCEDURES  Procedure(s) performed (including Critical Care):  Procedures   ____________________________________________   INITIAL IMPRESSION / ASSESSMENT AND PLAN / ED COURSE  Patient had pleuritic chest pain within 5 or 10 minutes of starting very dialysis.  It was accompanied by some increase in her baseline shortness of  breath.  Her d-dimer is positive.  I will CT her.  I discussed this with Dr. Zollie Scale we will do that test and then see what it shows and decide on whether or not she needs further dialysis today afterwards.    ----------------------------------------- 3:33 PM on 08/21/2018 -----------------------------------------  CT is pending.  Second troponin is to be done.  Dr. Idamae Lusher will follow up with these and check with Dr. Zollie Scale.   Patient complains of some chest pain getting worse.  We repeated the EKG repeat EKG shows sinus rhythm at 71 normal axis no changes compared to #1.  Patient repositioned in her sats which have been down to 90 to go up to 96.  Are waiting a second troponin and the CT report.       ____________________________________________   FINAL CLINICAL IMPRESSION(S) / ED DIAGNOSES  Final diagnoses:  Chest pain, unspecified type     ED Discharge Orders    None       Note:  This document was prepared using Dragon voice recognition software and may include unintentional dictation errors.    Nena Polio, MD 08/21/18 1421    Nena Polio, MD 08/21/18 1533    Nena Polio, MD 08/21/18 1610    Nena Polio, MD 08/21/18 [phone]

## 2018-08-23 LAB — PARATHYROID HORMONE, INTACT (NO CA): PTH: 77 pg/mL — ABNORMAL HIGH (ref 15–65)

## 2018-09-17 ENCOUNTER — Other Ambulatory Visit: Payer: Self-pay | Admitting: Neurology

## 2018-09-17 DIAGNOSIS — R404 Transient alteration of awareness: Secondary | ICD-10-CM

## 2018-09-17 DIAGNOSIS — T8619 Other complication of kidney transplant: Secondary | ICD-10-CM

## 2018-09-17 DIAGNOSIS — Z992 Dependence on renal dialysis: Secondary | ICD-10-CM

## 2018-09-17 DIAGNOSIS — R270 Ataxia, unspecified: Secondary | ICD-10-CM

## 2018-09-17 DIAGNOSIS — R4789 Other speech disturbances: Secondary | ICD-10-CM

## 2018-09-25 ENCOUNTER — Other Ambulatory Visit: Payer: Self-pay | Admitting: Neurology

## 2018-10-18 ENCOUNTER — Telehealth: Payer: Self-pay | Admitting: Neurology

## 2018-10-18 NOTE — Telephone Encounter (Signed)
Called the patient's mom's phone. No answer. LVM informing that we are limiting with bringing patients into the office. LVM asking if they would be ok with changing the apt to a video and or telephone visit. LVM asking for a call back to discuss that process.

## 2018-10-18 NOTE — Telephone Encounter (Signed)
Pt has called back to inform that she would like the appointment to be a telephone visit for 06-17, pt is asking to be called on [phone].  No call back requested

## 2018-10-18 NOTE — Telephone Encounter (Signed)
Attempted to call the patient to review chart. No answer. Busy line.

## 2018-10-19 NOTE — Telephone Encounter (Signed)

## 2018-10-19 NOTE — Addendum Note (Signed)
Addended by: Darleen Crocker on: 10/19/2018 10:28 AM   Modules accepted: Orders

## 2018-10-20 ENCOUNTER — Ambulatory Visit (INDEPENDENT_AMBULATORY_CARE_PROVIDER_SITE_OTHER): Payer: Medicare Other | Admitting: Neurology

## 2018-10-20 ENCOUNTER — Encounter: Payer: Self-pay | Admitting: Neurology

## 2018-10-20 ENCOUNTER — Other Ambulatory Visit: Payer: Self-pay

## 2018-10-20 DIAGNOSIS — R270 Ataxia, unspecified: Secondary | ICD-10-CM

## 2018-10-20 DIAGNOSIS — T8619 Other complication of kidney transplant: Secondary | ICD-10-CM

## 2018-10-20 DIAGNOSIS — N186 End stage renal disease: Secondary | ICD-10-CM

## 2018-10-20 DIAGNOSIS — R4789 Other speech disturbances: Secondary | ICD-10-CM | POA: Diagnosis not present

## 2018-10-20 DIAGNOSIS — R404 Transient alteration of awareness: Secondary | ICD-10-CM

## 2018-10-20 DIAGNOSIS — Z992 Dependence on renal dialysis: Secondary | ICD-10-CM

## 2018-10-20 DIAGNOSIS — R569 Unspecified convulsions: Secondary | ICD-10-CM

## 2018-10-20 DIAGNOSIS — E11649 Type 2 diabetes mellitus with hypoglycemia without coma: Secondary | ICD-10-CM

## 2018-10-20 DIAGNOSIS — D631 Anemia in chronic kidney disease: Secondary | ICD-10-CM

## 2018-10-20 MED ORDER — ETHOSUXIMIDE 250 MG/5ML PO SOLN: ORAL | 3 refills | Status: DC

## 2018-10-20 MED ORDER — LEVETIRACETAM 250 MG PO TABS: ORAL_TABLET | ORAL | 5 refills | Status: DC

## 2018-10-20 MED ORDER — CLONAZEPAM 1 MG PO TABS: 1.0000 mg | ORAL_TABLET | Freq: Every day | ORAL | 1 refills | Status: DC

## 2018-10-20 MED ORDER — LAMICTAL 200 MG PO TABS: 300.0000 mg | ORAL_TABLET | Freq: Every day | ORAL | 1 refills | Status: DC

## 2018-10-20 NOTE — Progress Notes (Signed)
Virtual Visit via Telephone Note  I connected with Nina Johnston on 10/20/18 at  3:30 PM EDT by telephone and verified that I am speaking with the correct person using two identifiers.  Location:  Patient: at home Provider: at [facility]   I discussed the limitations, risks, security and privacy concerns of performing an evaluation and management service by telephone and the availability of in person appointments. I also discussed with the patient that there may be a patient responsible charge related to this service. The patient expressed understanding and agreed to proceed.   History of Present Illness: PHONE V" Nina Johnston is a 58 y.o. female patient with a multitude of medical problems culminating in her hemodialysis treatments.  The patient also has frequent spells which appear to be seizure-like activity and may be are epileptiform.  She has been on seizure medication also for the treatment of psychiatric conditions.  Since I saw her last on April 07, 2018 the patient has had for further spells 1 on 7 April 1 on April 18, 1 on 19 May and 1 of 13 June all these days with hemodialysis days, all the spells lasted about 20 minutes, and after these 20 minutes she is not back to normal but she is just lethargic and sometimes even combative.  We have discussed why the spells happen and I do think that it has to do with the hemodialysis treatment either the shift of electrolytes or volume shift.  Blood GLUCOSE has been between 180 and 111 mg/dl.  She is now proud owner of a transdermal glucose meter she has had.me A relatively good control of her blood sugars recently.  She reports further and her mother confirms that about 3.5 L of fluid are usually removed with his dialysis treatment and that her dry weight has been corrected downwards as she lost weight.  We discussed that keeping control of her fluid intake will be essential and avoiding large blood pressure variations during treatment, but I would  also still refill the seizure medications, we discussed about an EEG that would be up to 72 hours with the patient and could be used or in place while she receives dialysis treatments.  The patient had this idea after discussing it with a medical friend and I think that would be an interesting approach.  I will order a portable EEG today I will probably ask my colleague Dr. Ellouise Newer at the [facility] neurology.  I will refill Lamictal Klonopin ethosuximide and Keppra today.   04-07-2018, "physically not the most of all antiseizure medicines but it is a good as a pleasure of meeting Nina Johnston today, a 58 year old Caucasian right-handed female on hemodialysis with seizure episodes.  A description of her most recent spells that sound very much epileptiform is at the bottom of today's note.  There is speech arrest, automatism, right hand tremor, left hand fisting, a complete recovery within 20 minutes 5 minutes, followed by significant sleepiness and fatigue.  The patient continues to be on hemodialysis 3 times a week for 4 hours she has been able to control her fluid intake she has also been doing better with glucose control.  We are discussing today what kind of additional medication may help her to control her seizures especially since the spells occur on a dialysis day following her treatment.  We discussed Keppra as a possible treatment option since it is neither hepatically no renally metabolized.  Interval history 7-31 -2019  Many (5)  more  spells since May visit. She has continued with hemodialysis- only on dialysis days. . She has hemoglobin at 8.8, phosphorus 353. She blames [name] - her phosphate binder - to have caused diarrhea and caused her to be embarassed .She also gets some iron through [name]. She cut down the dose as she had to go many times to the bathroom during dialysis. Now phosphate is up and her anemia is more severe. She has also high blood sugars some days.   She is taking a  nap after supper - this seems to help the prevent spells.  We try clonazepam pre- dialysis to reduce the post dialysis spells,- these can be seizures, electrolyte shifting, glucose, HTN related., and address phosphate-binding medication that may give less diarrhea. Nina Johnston will need to control Glucose, volume and naps times.    Interval history 09-23-2017, I have the pleasure of meeting with [name] in her family today, patient has been able to continue with regular hemodialysis and seems to be well dialyzed.  It took respiratory creation of an arteriovenous fistula to reach the patency, she had several revisions.  She had a spell on her way home last August another spell while in dialysis and then several short spells over September -October and January.  Epogen shots started at [facility] on 11 February and she gets additional shots every 2 days until 25 June 2017.  Starting dose with 3000 units and she is now reaching 5000 units.  She is also taking oral iron and Pepcid to help with gastritis and any blood loss from there.  Over 2 months after Epogen was initiated she had a spell in the afternoon while riding in a car, Nina Johnston had to vomit and on 28 April the second spell at home that lasted about 10 minutes.  Her mother has witnessed both describes her as unresponsive to verbal haptic or acoustic stimuli or visual stimuli.  She seems to daydream she is breathing regularly there is no convulsion noted, and her mother has witnessed these on nondialysis days in the afternoon hours.  Her sugar has never been too low, she has not fallen she has not manifested any convulsions.  Given the almost 2 months time difference between Epogen and the spell occurrences I do not think that they are related I hope that the higher hemoglobin and hematocrit will help her having less lightheadedness less fatigue finding more energy overall.  At this time she is taking an oral iron citrate, and it seems not necessary to add IV iron.   I would like her to continue with the treatment of anemia as laid out.  She may remain on 5000 units and oral iron. Keppra -     Interval history from 08 June 2017,  I have the pleasure of seeing Nina Johnston today, following a spell on January 29 during dialysis in which she stared off and was not responsive.  She also had a spell the same night at home witnessed by her mother.  There were further spells on August 1, August 31, September 12 and 14 as well as 22nd, October 16 and October 23.  Only 2 of these spells were prolonged with associated confusion or disorientation to her surroundings. Since he has saw her hematologist last Wednesday, her hematocrit and hemoglobin levels have been very low.  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  This occurred in 2011 and 2012.  She has not received any  erythropoietin after that but I think she needs now.  In order to overcome this very severe anemia.  She has been transfused multiple times ( 9 times )  and that cannot be healthy either. Her hematologist and her nephrologist are both leading towards erythropoietin therapy again.  Her nephrologist is Audiological scientist, pathologist is Evelena Asa at [facility]. It's causing problems with dialysis effectiveness ( BP of 80/ 40 mmHg) , and with SOB.    CD_ This Caucasian right-handed, single female has been followed in this practice since 2009. 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  She had such spells in August 2010 and September 2011 after carbamazepine was changed to a generic form of Carbatrol her blood levels were lower,   she also developed upper respiratory tract infections and pneumonia in 2011 and was unable for a while to use her CPAP which had been initiated in [place] for the treatment of obstructive sleep apnea. Paroxysmal events with mental status changes had continued,  03/17/2011  and 20 13 some of the spells are described as " automatisms"- continuing behavior while  the patient is staring off and acting without  reflecting not being  responsive to stimulation from the outside . After the spell passes, she became very sleepy each time. In August 2012 she was finally referred to an endocrinologist at [facility] after she had a hypoglycemic seizure and her diabetes medications were reduced, now her blood sugars are running between 150 and 200 in the morning,  fasting.Nina Johnston begun working out in 2013 with a Physiological scientist and was able to lose about 30 pounds . She returned to using her CPAP.her Hb A1 C. in October of last year was 6.1- she had no other hypoglycemic events reported since March 2013 . Also in 2013 she had to visit the local ER twice for abdominal spasms and cardiac / chest pain. negative work up. She was developing renal failure.  She has since been followed for a decreased renal clearance by internal medicine and nephrology. The patient is treated for her spells  and remains on Tegretol. It she will need blood levels for her anti-epiletic medications. Dr. Geryl Councilman is debating to reduce her Klonopin , which may help reduce her fall risk as well. Vit D deficiency- takes supplement.    Note contact at [facility]; transplant. Dr . Talmadge Coventry,  [facility] [place] [phone]. History from 04/16/2016. Nina Johnston is here today for a routine revisit but she did have some healthcare over the last week. She is now on hemodialysis, and experienced bleeding at the AV fistula of her left arm during the last treatment. She notice that the patella was blood drenched but her treatment time was almost over. She also had to  be evaluated at the local hospital emergency room for a malignant blood pressure peak. Her systolic blood pressure went up , she had a very high heart rate between 150s and 160 bpm. The ED suspected that she had atrial flutter, and she had hypokalemia. Hgb was 8.2.  on last Monday she was seen by cardiology and told she had sinus tachycardia. Chest Xray as she coughed blood. mammogram was due, normal, and potassium /agnesium was normalized.   Interval history  from 12/03/2016. I have pleasure of seeing Nina Johnston today who has been on hemodialysis since 18 month ago, she has a left arm AV fistula for hemodialysis access, her blood pressures have been lower especially in treatment and she was asked not to take hypertension medications prior to treatment. She will take the medication either after or the day before. I have The patient on antiepileptic medications after she had recurrent convulsions but we also found a correlation to hypoglycemia.  She has remained on Tegretol. She continues to take Lamictal. New problem: tachycardia since blood transfusion. Her hemoglobin was 6.9, her pulse rate after transfusion was well in the 120s. Pulse rate addressed today was 120. Please note that the patient had also tachycardia up to rates between 150s and 160 in December 2017. At that time she was diagnosed with hypokalemia, her hemoglobin then was 8.2. She needs to speak to her nephrologist.      Observations/Objective:    Assessment and Plan:Nina Johnston is a 58 y.o. female patient with a multitude of medical problems culminating in her hemodialysis treatments.  The patient also has frequent spells which appear to be seizure-like activity and may be are epileptiform.  She has been on seizure medication also for the treatment of psychiatric conditions.  Since I saw her last on April 07, 2018 the patient has had for further spells 1 on 7 April 1 on April 18, 1 on 19 May and 1 of 13 June all these  days with hemodialysis days, all the spells lasted about 20 minutes, and after these 20 minutes she is not back to normal but she is just lethargic and sometimes even combative.  We have discussed why the spells happen and I do think that it has to do with the hemodialysis treatment either the shift of electrolytes or volume shift.  Blood GLUCOSE has been between 180 and 111 mg/dl.  She is now proud owner of a transdermal glucose meter she has had.me A relatively good control of her blood sugars recently.  She reports further and her mother confirms that about 3.5 L of fluid are usually removed with his dialysis treatment and that her dry weight has been corrected downwards as she lost weight.    Follow Up Instructions:  We discussed that keeping control of her fluid intake will be essential and avoiding large blood pressure variations during treatment, but I would also still refill the seizure medications, we discussed about an EEG that would be up to 72 hours with the patient and could be used or in place while she receives dialysis treatments.  The patient had this idea after discussing it with a medical friend and I think that would be an interesting approach.  I will order a portable EEG today I will probably ask my colleague Dr. Ellouise Newer at the [facility] neurology.  I will refill Lamictal Klonopin ethosuximide and Keppra today.     I discussed the assessment and treatment plan with the patient. The patient was provided an opportunity to ask questions and all were answered. The patient agreed with the plan and demonstrated an understanding of the instructions.   The patient was advised to call back or seek an in-person evaluation if the symptoms worsen or if the condition fails to improve as anticipated.  I provided 18 minutes of non-face-to-face time during this encounter.   Larey Seat, MD

## 2018-10-20 NOTE — Patient Instructions (Signed)
Dear [name], I have ordered refills for all your seizure relevant medications with a 90-day supply and several refills.  I have further ordered a prolonged ambulatory EEG recording.  As explained by phone today it may take a referral to a colleague in town to get this ambulatory EEG done, we will keep you updated. Larey Seat, MD

## 2018-10-25 ENCOUNTER — Ambulatory Visit: Payer: Medicare Other | Admitting: Urology

## 2018-11-02 ENCOUNTER — Telehealth: Payer: Self-pay | Admitting: Neurology

## 2018-11-02 NOTE — Telephone Encounter (Signed)
Received an e-mail from the neurovative representative stating something else was needed for the patient to get set up. When I replied back he responded stating "Thanks for getting back to me. Actually, about an hour ago, this patient declined doing the study now, stating, "she won't do it until there is a vaccine for COVID-19."  That may be a while. If you speak with her and she changes her mind just let me know."  Patient is stating she doesn't want to complete the EEG testing at this tme. I will make Dr Dohmeier aware of this.

## 2018-11-10 ENCOUNTER — Encounter: Payer: Medicare Other | Admitting: Obstetrics and Gynecology

## 2018-11-29 ENCOUNTER — Ambulatory Visit: Payer: Medicare Other | Admitting: Urology

## 2018-12-03 ENCOUNTER — Other Ambulatory Visit: Payer: Self-pay | Admitting: Neurology

## 2018-12-03 DIAGNOSIS — R404 Transient alteration of awareness: Secondary | ICD-10-CM

## 2018-12-03 DIAGNOSIS — R270 Ataxia, unspecified: Secondary | ICD-10-CM

## 2018-12-03 DIAGNOSIS — Z992 Dependence on renal dialysis: Secondary | ICD-10-CM

## 2018-12-03 DIAGNOSIS — T8619 Other complication of kidney transplant: Secondary | ICD-10-CM

## 2018-12-03 DIAGNOSIS — R4789 Other speech disturbances: Secondary | ICD-10-CM

## 2018-12-08 ENCOUNTER — Other Ambulatory Visit: Payer: Self-pay | Admitting: Neurology

## 2018-12-08 ENCOUNTER — Telehealth: Payer: Self-pay | Admitting: Neurology

## 2018-12-08 DIAGNOSIS — Z992 Dependence on renal dialysis: Secondary | ICD-10-CM

## 2018-12-08 DIAGNOSIS — R404 Transient alteration of awareness: Secondary | ICD-10-CM

## 2018-12-08 DIAGNOSIS — T8619 Other complication of kidney transplant: Secondary | ICD-10-CM

## 2018-12-08 DIAGNOSIS — R270 Ataxia, unspecified: Secondary | ICD-10-CM

## 2018-12-08 DIAGNOSIS — R4789 Other speech disturbances: Secondary | ICD-10-CM

## 2018-12-08 MED ORDER — ETHOSUXIMIDE 250 MG/5ML PO SOLN: ORAL | 3 refills | Status: DC

## 2018-12-08 NOTE — Telephone Encounter (Signed)
Refill send to the pharmacy for the patient.

## 2018-12-08 NOTE — Telephone Encounter (Signed)
Pt mother has called for a refill on ethosuximide (ZARONTIN) 250 MG/5ML solution [id number]

## 2018-12-10 ENCOUNTER — Ambulatory Visit (INDEPENDENT_AMBULATORY_CARE_PROVIDER_SITE_OTHER): Payer: Medicare Other

## 2018-12-10 ENCOUNTER — Other Ambulatory Visit: Payer: Self-pay

## 2018-12-10 ENCOUNTER — Encounter (INDEPENDENT_AMBULATORY_CARE_PROVIDER_SITE_OTHER): Payer: Self-pay

## 2018-12-10 ENCOUNTER — Encounter (INDEPENDENT_AMBULATORY_CARE_PROVIDER_SITE_OTHER): Payer: Self-pay | Admitting: Nurse Practitioner

## 2018-12-10 ENCOUNTER — Ambulatory Visit (INDEPENDENT_AMBULATORY_CARE_PROVIDER_SITE_OTHER): Payer: Medicare Other | Admitting: Nurse Practitioner

## 2018-12-10 VITALS — BP 146/71 | HR 64 | Resp 12 | Ht 62.0 in | Wt 206.0 lb

## 2018-12-10 DIAGNOSIS — N186 End stage renal disease: Secondary | ICD-10-CM | POA: Diagnosis not present

## 2018-12-10 DIAGNOSIS — I10 Essential (primary) hypertension: Secondary | ICD-10-CM | POA: Diagnosis not present

## 2018-12-10 DIAGNOSIS — Z992 Dependence on renal dialysis: Secondary | ICD-10-CM | POA: Diagnosis not present

## 2018-12-10 DIAGNOSIS — E1122 Type 2 diabetes mellitus with diabetic chronic kidney disease: Secondary | ICD-10-CM | POA: Diagnosis not present

## 2018-12-19 ENCOUNTER — Encounter (INDEPENDENT_AMBULATORY_CARE_PROVIDER_SITE_OTHER): Payer: Self-pay | Admitting: Nurse Practitioner

## 2018-12-19 NOTE — Progress Notes (Signed)
SUBJECTIVE:  Patient ID: Nina Johnston, female    DOB: 11-27-1960, 58 y.o.   MRN: [id number] Chief Complaint  Patient presents with  . Follow-up    HPI  Nina Johnston is a 58 y.o. female that presents today with complaints of burning in her access site.  She states that this is not constantly in order to have them while she is at dialysis.  She states that happens sometimes when she is sleeping on her fistula.  There is no evidence of skin threatening.  She denies any bleeding during dialysis.  She denies any fever, chills, nausea, vomiting or diarrhea.  She denies any issues with numbness, tingling or loss of sensation.  She denies any chest pain or shortness of breath.  She denies any uremic symptoms.  Today the patient flow volume was 1259 she has evidence of a patent AV fistula with no areas of hemodynamically significant stenosis.  Past Medical History:  Diagnosis Date  . A-V fistula ([place])   . Acute bacterial sinusitis 04/19/2014  . Adaptive colitis   . Anginal pain ([place])   . Anxiety   . Anxiety and depression   . Arthritis   . Asthma   . 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 Benign essential HTN 12/26/2014  . Bipolar disorder ([place])   . Bronchitis   . Carotid artery narrowing 12/22/2014  . Cervical prolapse   . CHF (congestive heart failure) ([place])   . CKD (chronic kidney disease)   . Concussion 01-14-14   Fall   . COPD (chronic obstructive pulmonary disease) ([place])   . Coronary artery disease   . Depression   . Diabetes mellitus without complication ([place])   . Dialysis patient ([place])   . Diplopia   . Dupuytren's contracture of foot   . Essential (primary) hypertension 02/12/2015  . Family history of thyroid problem   . GERD (gastroesophageal reflux disease)   . Gout   . Heart disease    enlarged because of COPD  . Heart murmur   . Hepatomegaly   . HLD (hyperlipidemia)   . Hyperactive  airway disease   . Hyperkalemia   . Hypertension   . Hyperthyroidism   . Irritable colon   . Morbid obesity ([place])   . Multiple thyroid nodules   . OAB (overactive bladder)   . Pernicious anemia   . Post menopausal syndrome   . Post-concussion syndrome 03/08/2014  . Post-traumatic brain syndrome   . Prolapsed uterus   . PSVT (paroxysmal supraventricular tachycardia) ([place])   . Renal calculus   . Renal disease    w/ GFR 29-may be due to diabetes  . Seizures ([place])    last seizure 2012. has had spells since with no knowledge. last was 1 month ago  . Sleep apnea   . 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 Transient alteration of awareness 11/10/2012  . Urinary incontinence with continuous leakage 03/08/2014    Past Surgical History:  Procedure Laterality Date  . A/V FISTULAGRAM Left 07/21/2016   Procedure: A/V Fistulagram;  Surgeon: Algernon Huxley, MD;  Location: [facility];  Service: Cardiovascular;  Laterality: Left;  . A/V FISTULAGRAM Left 12/24/2016   Procedure: A/V Fistulagram;  Surgeon: Algernon Huxley, MD;  Location: [place] [facility];  Service: Cardiovascular;  Laterality: Left;  . A/V FISTULAGRAM  Left 06/29/2017   Procedure: A/V FISTULAGRAM;  Surgeon: Algernon Huxley, MD;  Location: [facility] [facility];  Service: Cardiovascular;  Laterality: Left;  . A/V FISTULAGRAM Left 03/08/2018   Procedure: A/V FISTULAGRAM;  Surgeon: Algernon Huxley, MD;  Location: [facility];  Service: Cardiovascular;  Laterality: Left;  . A/V SHUNT INTERVENTION N/A 07/21/2016   Procedure: A/V Shunt Intervention;  Surgeon: Algernon Huxley, MD;  Location: [facility] [facility];  Service: Cardiovascular;  Laterality: N/A;  . A/V SHUNT INTERVENTION N/A 12/24/2016   Procedure: A/V SHUNT INTERVENTION;  Surgeon: Algernon Huxley, MD;  Location: [place] [facility];  Service: Cardiovascular;  Laterality: N/A;  . AVF    . CHOLECYSTECTOMY    . COLONOSCOPY    . COLONOSCOPY  WITH PROPOFOL N/A 03/18/2017   Procedure: COLONOSCOPY WITH PROPOFOL;  Surgeon: Manya Silvas, MD;  Location: [facility] ENDOSCOPY;  Service: Endoscopy;  Laterality: N/A;  . ENDOMETRIAL BIOPSY     ablation, uterine  . ESOPHAGOGASTRODUODENOSCOPY (EGD) WITH PROPOFOL N/A 03/18/2017   Procedure: ESOPHAGOGASTRODUODENOSCOPY (EGD) WITH PROPOFOL;  Surgeon: Manya Silvas, MD;  Location: [facility] ENDOSCOPY;  Service: Endoscopy;  Laterality: N/A;  . HERNIA REPAIR    . HERNIA REPAIR    . PERIPHERAL VASCULAR CATHETERIZATION N/A 08/23/2015   Procedure: Dialysis/Perma Catheter Insertion;  Surgeon: Algernon Huxley, MD;  Location: [place] [facility];  Service: Cardiovascular;  Laterality: N/A;  . PERIPHERAL VASCULAR CATHETERIZATION N/A 11/09/2015   Procedure: Dialysis/Perma Catheter Removal;  Surgeon: Katha Cabal, MD;  Location: [place] [facility];  Service: Cardiovascular;  Laterality: N/A;  . PERIPHERAL VASCULAR CATHETERIZATION Left 04/23/2016   Procedure: A/V Fistulagram;  Surgeon: Algernon Huxley, MD;  Location: [place] [facility];  Service: Cardiovascular;  Laterality: Left;  . PORT A CATH REVISION      Social History   Socioeconomic History  . Marital status: Single    Spouse name: Not on file  . Number of children: 0  . Years of education:  college  . Highest education level: Not on file  Occupational History  . 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 Financial resource strain: Not very hard  . Food insecurity    Worry: Sometimes true    Inability: Sometimes true  . Transportation needs    Medical: No    Non-medical: No  Tobacco Use  . Smoking status: Never Smoker  . Smokeless tobacco: Never Used  Substance and Sexual Activity  . Alcohol use: No  . Drug use: No  . Sexual activity: Not Currently  Lifestyle  . Physical activity    Days per week: 0 days    Minutes per session: Not on file  . Stress: Not at all  Relationships  . Social connections    Talks on phone: More than three  times a week    Gets together: More than three times a week    Attends religious service: More than 4 times per year    Active member of club or organization: Yes    Attends meetings of clubs or organizations: 1 to 4 times per year    Relationship status: Divorced  . Intimate partner violence    Fear of current or ex partner: No    Emotionally abused: No    Physically abused: No    Forced sexual activity: No  Other Topics Concern  . Not on file  Social History Narrative   Patient is single and lives with her  parents.   Patient is disabled.   Patient has a college education.   Patient is right- handed.   Patient drinks tea occasionally. 2 glasses of tea when they eat out.    Family History  Problem Relation Age of Onset  . Diabetes Mother   . Lumbar disc disease Mother   . Migraines Mother   . Hypertension Mother   . Cancer - Prostate Father        mild  . Diabetes Father   . Hypertension Father   . Breast cancer Neg Hx   . Ovarian cancer Neg Hx   . Colon cancer Neg Hx   . Heart disease Neg Hx   . Kidney cancer Neg Hx   . Bladder Cancer Neg Hx     Allergies  Allergen Reactions  . Cinnamon Other (See Comments)    Raises blood pressure  . Procrit [Epoetin Alfa] Other (See Comments)    Seizures, grand mal  . Ace Inhibitors Cough  . Azithromycin Other (See Comments)    seizures  . Iron Other (See Comments)    Can not take in IV Form - causes seizures   . Neomycin-Bacitracin Zn-Polymyx Other (See Comments)    Unknown  . Sulfa Antibiotics Other (See Comments)    Due to risks for seizures.     Review of Systems   Review of Systems: Negative Unless Checked Constitutional: [] Weight loss  [] Fever  [] Chills Cardiac: [] Chest pain   []  Atrial Fibrillation  [] Palpitations   [] Shortness of breath when laying flat   [] Shortness of breath with exertion. [] Shortness of breath at rest Vascular:  [] Pain in legs with walking   [] Pain in legs with standing [] Pain in legs when  laying flat   [] Claudication    [] Pain in feet when laying flat    [] History of DVT   [] Phlebitis   [] Swelling in legs   [] Varicose veins   [] Non-healing ulcers Pulmonary:   [] Uses home oxygen   [] Productive cough   [] Hemoptysis   [] Wheeze  [] COPD   [] Asthma Neurologic:  [] Dizziness   [] Seizures  [] Blackouts [] History of stroke   [] History of TIA  [] Aphasia   [] Temporary Blindness   [] Weakness or numbness in arm   [] Weakness or numbness in leg Musculoskeletal:   [] Joint swelling   [] Joint pain   [] Low back pain  []  History of Knee Replacement [] Arthritis [] back Surgeries  []  Spinal Stenosis    Hematologic:  [] Easy bruising  [] Easy bleeding   [] Hypercoagulable state   [x] Anemic Gastrointestinal:  [] Diarrhea   [] Vomiting  [] Gastroesophageal reflux/heartburn   [] Difficulty swallowing. [] Abdominal pain Genitourinary:  [x] Chronic kidney disease   [] Difficult urination  [] Anuric   [] Blood in urine [] Frequent urination  [] Burning with urination   [] Hematuria Skin:  [] Rashes   [] Ulcers [] Wounds Psychological:  [] History of anxiety   []  History of major depression  [x]  Memory Difficulties      OBJECTIVE:   Physical Exam  BP (!) 146/71 (BP Location: Right Arm, Patient Position: Sitting, Cuff Size: Large)   Pulse 64   Resp 12   Ht 5\' 2"  (1.575 m)   Wt 206 lb (93.4 kg)   BMI 37.68 kg/m   Gen: WD/WN, NAD Head: [place]/AT, No temporalis wasting.  Ear/Nose/Throat: Hearing grossly intact, nares w/o erythema or drainage Eyes: PER, EOMI, sclera nonicteric.  Neck: Supple, no masses.  No JVD.  Pulmonary:  Good air movement, no use of accessory muscles.  Cardiac: RRR Vascular:  Good thrill and bruit  Vessel Right Left  Radial Palpable Palpable   Gastrointestinal: soft, non-distended. No guarding/no peritoneal signs.  Musculoskeletal: M/S 5/5 throughout.  No deformity or atrophy.  Neurologic: Pain and light touch intact in extremities.  Symmetrical.  Speech is fluent. Motor exam as listed above.  Psychiatric: Judgment intact, Mood & affect appropriate for pt's clinical situation. Dermatologic: No Venous rashes. No Ulcers Noted.  No changes consistent with cellulitis. Lymph : No Cervical lymphadenopathy, no lichenification or skin changes of chronic lymphedema.       ASSESSMENT AND PLAN:  1. ESRD on dialysis [facility]) Recommend:  The patient is doing well and currently has adequate dialysis access. The patient's dialysis center is not reporting any access issues. Flow pattern is stable when compared to the prior ultrasound.  The patient should have a duplex ultrasound of the dialysis access in 6 months. The patient will follow-up with me in the office after each ultrasound    - VAS [place] (AVF, AVG); Future  2. Benign essential HTN Continue antihypertensive medications as already ordered, these medications have been reviewed and there are no changes at this time.   3. Diabetes mellitus with ESRD (end-stage renal disease) ([place]) Continue hypoglycemic medications as already ordered, these medications have been reviewed and there are no changes at this time.  Hgb A1C to be monitored as already arranged by primary service    Current Outpatient Medications on File Prior to Visit  Medication Sig Dispense Refill  . amLODipine (NORVASC) 5 MG tablet Take 5 mg by mouth See admin instructions. Take 5 mg by mouth daily except do not take on Tuesday, Thursday and Saturday.    Marland Kitchen aspirin 81 MG tablet Take 81 mg by mouth daily.     Marland Kitchen b complex vitamins tablet Take 1 tablet by mouth at bedtime.     . benzonatate (TESSALON) 200 MG capsule Take by mouth.    . clonazePAM (KLONOPIN) 1 MG tablet Take 1-2 tablets (1-2 mg total) by mouth daily. 180 tablet 1  . cloNIDine (CATAPRES) 0.1 MG tablet TAKE 1 TABLET (0.1 MG TOTAL) BY MOUTH 2 (TWO) TIMES DAILY    . epoetin alfa (EPOGEN,PROCRIT) 3000 UNIT/ML injection Inject 3,000 Units into the vein. Every tues, thur and sat with  dialysis    . ethosuximide (ZARONTIN) 250 MG/5ML solution 5 ml bid 473 mL 3  . Febuxostat 80 MG TABS Take 80 mg by mouth daily.     . fexofenadine (ALLEGRA) 180 MG tablet Take 180 mg by mouth daily.     . Fluticasone-Salmeterol (ADVAIR DISKUS) 250-50 MCG/DOSE AEPB Inhale 1 puff into the lungs 2 (two) times daily.    . furosemide (LASIX) 20 MG tablet Take 30 mg by mouth 2 (two) times daily.     . hydrALAZINE (APRESOLINE) 100 MG tablet Take 50 mg by mouth See admin instructions. Take 100 mg by mouth twice daily on non-dialysis days. Take 100 mg by mouth at bedtime on dialysis days Tuesday, Thursday and Saturday    . insulin glargine (LANTUS) 100 UNIT/ML injection Inject 22 Units into the skin at bedtime.     . insulin lispro (HUMALOG) 100 UNIT/ML injection Inject 10-15 Units into the skin 3 (three) times daily with meals. Per sliding scale    . LAMICTAL 200 MG tablet Take 1.5 tablets (300 mg total) by mouth daily. 270 tablet 1  . levETIRAcetam (KEPPRA) 250 MG tablet Take Bid on non -dialysis days, 3 a day on hemodialysis days. 270 tablet 5  .  levothyroxine (SYNTHROID) 150 MCG tablet Take 150 mcg by mouth daily before breakfast.     . metoprolol succinate (TOPROL XL) 50 MG 24 hr tablet Take 1 tablet (50 mg total) by mouth 2 (two) times daily. Take with or immediately following a meal. (Patient taking differently: Take 75 mg by mouth See admin instructions. Take 75 mg by mouth twice daily on non dialysis days. Take 75 mg by mouth at bedtime on dialysis days Tuesday, Thursday and Saturday.) 60 tablet 11  . montelukast (SINGULAIR) 10 MG tablet Take 10 mg by mouth at bedtime.    Marland Kitchen nystatin-triamcinolone (MYCOLOG II) cream Apply 1 application topically 2 (two) times daily. (Patient taking differently: Apply 1 application topically daily as needed (rash). ) 30 g 1  . pantoprazole (PROTONIX) 40 MG tablet Take 40 mg by mouth every morning.     . Probiotic Product (ALIGN) 4 MG CAPS Take 4 mg by mouth daily.      Marland Kitchen triamcinolone (NASACORT ALLERGY 24HR) 55 MCG/ACT AERO nasal inhaler Place 2 sprays into the nose at bedtime.     . B Complex-C-Folic Acid (DIALYVITE TABLET) TABS Take 1 tablet by mouth daily.  12  . cyanocobalamin (,VITAMIN B-12,) 1000 MCG/ML injection Inject 1,000 mcg into the muscle every 14 (fourteen) days.     Marland Kitchen UNIFINE PENTIPS 32G X 4 MM MISC USE UP TO 5 TIMES DAILY AS DIRECTED  3   No current facility-administered medications on file prior to visit.     There are no Patient Instructions on file for this visit. Return in about 6 months (around 06/12/2019).   Kris Hartmann, NP  This note was completed with Sales executive.  Any errors are purely unintentional.

## 2018-12-27 DIAGNOSIS — I350 Nonrheumatic aortic (valve) stenosis: Secondary | ICD-10-CM | POA: Insufficient documentation

## 2019-01-03 ENCOUNTER — Telehealth: Payer: Self-pay | Admitting: Neurology

## 2019-01-03 NOTE — Telephone Encounter (Signed)
Pt mother is asking that Dr Brett Fairy writes a letter to [employer] explaining that pt needs the brand name version of LAMICTAL 200 MG tablet.   Pt has 4 days remaining of the LAMICTAL 200 MG tablet Mother states last time pt took generic version pt had a grand mal seizure.  Please call pt's mother

## 2019-01-03 NOTE — Telephone Encounter (Signed)
I have printed the letter and will ask MD to sign.

## 2019-01-03 NOTE — Telephone Encounter (Signed)
LVM for pt's daughter advising letter has been formulated and I would call back once sent to insurance company.

## 2019-01-04 NOTE — Telephone Encounter (Signed)
I reached out to the pt's mother. I advised the letter is ready to be faxed and she provided the [facility] # for reference. The # is [id number] member ID # is [id number]. I called the # and spoke with [facility] and she advised a formulary exception form has to be completed. She is going to fax the form to [phone].

## 2019-01-05 NOTE — Telephone Encounter (Signed)
I have received the fax from [facility] stating the appeal for brand name Lamictal has been approved for the pt. Approval dates 01/05/2019- 01/04/2020.  Pt along with her mother advised of this information and verbalized understanding.

## 2019-01-24 ENCOUNTER — Emergency Department: Payer: Medicare Other

## 2019-01-24 ENCOUNTER — Other Ambulatory Visit: Payer: Self-pay

## 2019-01-24 ENCOUNTER — Encounter: Payer: Self-pay | Admitting: Emergency Medicine

## 2019-01-24 ENCOUNTER — Emergency Department
Admission: EM | Admit: 2019-01-24 | Discharge: 2019-01-24 | Disposition: A | Discharge status: Home / Self Care | Payer: Medicare Other | Attending: Emergency Medicine | Admitting: Emergency Medicine

## 2019-01-24 DIAGNOSIS — I2510 Atherosclerotic heart disease of native coronary artery without angina pectoris: Secondary | ICD-10-CM | POA: Diagnosis not present

## 2019-01-24 DIAGNOSIS — R51 Headache: Secondary | ICD-10-CM | POA: Insufficient documentation

## 2019-01-24 DIAGNOSIS — Z794 Long term (current) use of insulin: Secondary | ICD-10-CM | POA: Diagnosis not present

## 2019-01-24 DIAGNOSIS — M542 Cervicalgia: Secondary | ICD-10-CM | POA: Diagnosis not present

## 2019-01-24 DIAGNOSIS — Z79899 Other long term (current) drug therapy: Secondary | ICD-10-CM | POA: Insufficient documentation

## 2019-01-24 DIAGNOSIS — M79602 Pain in left arm: Secondary | ICD-10-CM | POA: Diagnosis not present

## 2019-01-24 DIAGNOSIS — I132 Hypertensive heart and chronic kidney disease with heart failure and with stage 5 chronic kidney disease, or end stage renal disease: Secondary | ICD-10-CM | POA: Diagnosis not present

## 2019-01-24 DIAGNOSIS — I509 Heart failure, unspecified: Secondary | ICD-10-CM | POA: Diagnosis not present

## 2019-01-24 DIAGNOSIS — E039 Hypothyroidism, unspecified: Secondary | ICD-10-CM | POA: Diagnosis not present

## 2019-01-24 DIAGNOSIS — W19XXXA Unspecified fall, initial encounter: Secondary | ICD-10-CM

## 2019-01-24 DIAGNOSIS — J45909 Unspecified asthma, uncomplicated: Secondary | ICD-10-CM | POA: Diagnosis not present

## 2019-01-24 DIAGNOSIS — E1122 Type 2 diabetes mellitus with diabetic chronic kidney disease: Secondary | ICD-10-CM | POA: Insufficient documentation

## 2019-01-24 DIAGNOSIS — J449 Chronic obstructive pulmonary disease, unspecified: Secondary | ICD-10-CM | POA: Insufficient documentation

## 2019-01-24 DIAGNOSIS — N186 End stage renal disease: Secondary | ICD-10-CM | POA: Diagnosis not present

## 2019-01-24 DIAGNOSIS — Z7982 Long term (current) use of aspirin: Secondary | ICD-10-CM | POA: Diagnosis not present

## 2019-01-24 DIAGNOSIS — Z992 Dependence on renal dialysis: Secondary | ICD-10-CM | POA: Diagnosis not present

## 2019-01-24 LAB — CBC WITH DIFFERENTIAL/PLATELET
Abs Immature Granulocytes: 0.18 10*3/uL — ABNORMAL HIGH (ref 0.00–0.07)
Basophils Absolute: 0.1 10*3/uL (ref 0.0–0.1)
Basophils Relative: 1 %
Eosinophils Absolute: 0.1 10*3/uL (ref 0.0–0.5)
Eosinophils Relative: 1 %
HCT: 32.4 % — ABNORMAL LOW (ref 36.0–46.0)
Hemoglobin: 10.2 g/dL — ABNORMAL LOW (ref 12.0–15.0)
Immature Granulocytes: 2 %
Lymphocytes Relative: 22 %
Lymphs Abs: 2.1 10*3/uL (ref 0.7–4.0)
MCH: 34.9 pg — ABNORMAL HIGH (ref 26.0–34.0)
MCHC: 31.5 g/dL (ref 30.0–36.0)
MCV: 111 fL — ABNORMAL HIGH (ref 80.0–100.0)
Monocytes Absolute: 0.5 10*3/uL (ref 0.1–1.0)
Monocytes Relative: 5 %
Neutro Abs: 6.9 10*3/uL (ref 1.7–7.7)
Neutrophils Relative %: 69 %
Platelets: 125 10*3/uL — ABNORMAL LOW (ref 150–400)
RBC: 2.92 MIL/uL — ABNORMAL LOW (ref 3.87–5.11)
RDW: 14.8 % (ref 11.5–15.5)
WBC: 9.8 10*3/uL (ref 4.0–10.5)
nRBC: 0 % (ref 0.0–0.2)

## 2019-01-24 LAB — COMPREHENSIVE METABOLIC PANEL
ALT: 18 U/L (ref 0–44)
AST: 18 U/L (ref 15–41)
Albumin: 4.1 g/dL (ref 3.5–5.0)
Alkaline Phosphatase: 190 U/L — ABNORMAL HIGH (ref 38–126)
Anion gap: 11 (ref 5–15)
BUN: 50 mg/dL — ABNORMAL HIGH (ref 6–20)
CO2: 25 mmol/L (ref 22–32)
Calcium: 8.6 mg/dL — ABNORMAL LOW (ref 8.9–10.3)
Chloride: 109 mmol/L (ref 98–111)
Creatinine, Ser: 6.81 mg/dL — ABNORMAL HIGH (ref 0.44–1.00)
GFR calc Af Amer: 7 mL/min — ABNORMAL LOW (ref >60)
GFR calc non Af Amer: 6 mL/min — ABNORMAL LOW (ref >60)
Glucose, Bld: 124 mg/dL — ABNORMAL HIGH (ref 70–99)
Potassium: 5.6 mmol/L — ABNORMAL HIGH (ref 3.5–5.1)
Sodium: 145 mmol/L (ref 135–145)
Total Bilirubin: 0.8 mg/dL (ref 0.3–1.2)
Total Protein: 6.7 g/dL (ref 6.5–8.1)

## 2019-01-24 MED ORDER — OXYCODONE-ACETAMINOPHEN 5-325 MG PO TABS: 1.0000 | ORAL_TABLET | Freq: Four times a day (QID) | ORAL | 0 refills | Status: AC | PRN

## 2019-01-24 MED ORDER — OXYCODONE-ACETAMINOPHEN 5-325 MG PO TABS
1.0000 | ORAL_TABLET | Freq: Once | ORAL | Status: AC
  Administered 2019-01-24: 1 via ORAL
  Filled 2019-01-24: qty 1

## 2019-01-24 MED ORDER — ONDANSETRON HCL 4 MG PO TABS: 4.0000 mg | ORAL_TABLET | Freq: Three times a day (TID) | ORAL | 0 refills | Status: AC | PRN

## 2019-01-24 MED ORDER — ONDANSETRON 4 MG PO TBDP
4.0000 mg | ORAL_TABLET | Freq: Once | ORAL | Status: AC
  Administered 2019-01-24: 4 mg via ORAL
  Filled 2019-01-24: qty 1

## 2019-01-24 NOTE — ED Notes (Signed)
Patient taken to imaging. 

## 2019-01-24 NOTE — Discharge Instructions (Signed)
Take pain medicine prescribed.  Please follow up with Dr. Roland Rack.

## 2019-01-24 NOTE — ED Provider Notes (Signed)
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 considered in my medical decision making (see chart for details).  Review of the [place] CSRS was performed in accordance of the [place] prior to dispensing any controlled  drugs.  Clinical Course as of Jan 23 1899  Mon Jan 24, 2019  1649 DG Shoulder Left [JW]    Clinical Course User Index [JW] Lannie Fields, PA-C          Assessment and Plan:  Fall:  58 year old female presents to the emergency department after mechanical, non-syncopal fall while walking into her appointment at [facility] clinic.  Patient reported left upper extremity pain.  Patient also hit her head against the door of her vehicle.  Patient was mildly hypertensive at triage but vital signs are otherwise reassuring.  Neuro exam was without acute deficits.  Differential diagnosis includes subdural hematoma, subarachnoid hemorrhage, C-spine fracture, fracture of the left upper  extremity  CT head and CT cervical spine revealed no acute abnormality.  X-ray examination of the humerus revealed a proximal, displaced and angulated fracture.  Pablo Pena on-call, Dr. Sabra Heck was consulted who recommended shoulder immobilization and follow-up with Dr. Roland Rack.  Patient was given Percocet in the emergency department for pain and she was discharged with Percocet.  Return precautions were given.  All patient questions were answered.   ____________________________________________  FINAL CLINICAL IMPRESSION(S) / ED DIAGNOSES  Final diagnoses:  Fall, initial encounter      NEW MEDICATIONS STARTED DURING THIS VISIT:  ED Discharge Orders         Ordered    oxyCODONE-acetaminophen (PERCOCET/ROXICET) 5-325 MG tablet  Every 6 hours PRN     01/24/19 1855    ondansetron (ZOFRAN) 4 MG tablet  Every 8 hours PRN     01/24/19 1855              This chart was dictated using voice recognition software/Dragon. Despite best efforts to proofread, errors can occur which can change the meaning. Any change was purely unintentional.    Karren Cobble 01/24/19 Rosezella Rumpf, MD 01/25/19 [phone]

## 2019-01-24 NOTE — ED Triage Notes (Signed)
Brought over from [facility]  States she was coming for an appt.  [place] out of car  Hit her head times 2   And landed on left wrist  Denies any LOC   positvive deformity noted to wrist

## 2019-01-29 ENCOUNTER — Emergency Department: Payer: Medicare Other

## 2019-01-29 ENCOUNTER — Other Ambulatory Visit: Payer: Self-pay

## 2019-01-29 ENCOUNTER — Emergency Department
Admission: EM | Admit: 2019-01-29 | Discharge: 2019-01-29 | Disposition: A | Discharge status: Home / Self Care | Payer: Medicare Other | Attending: Emergency Medicine | Admitting: Emergency Medicine

## 2019-01-29 DIAGNOSIS — I509 Heart failure, unspecified: Secondary | ICD-10-CM | POA: Insufficient documentation

## 2019-01-29 DIAGNOSIS — G40909 Epilepsy, unspecified, not intractable, without status epilepticus: Secondary | ICD-10-CM | POA: Diagnosis not present

## 2019-01-29 DIAGNOSIS — J449 Chronic obstructive pulmonary disease, unspecified: Secondary | ICD-10-CM | POA: Diagnosis not present

## 2019-01-29 DIAGNOSIS — K5790 Diverticulosis of intestine, part unspecified, without perforation or abscess without bleeding: Secondary | ICD-10-CM | POA: Diagnosis not present

## 2019-01-29 DIAGNOSIS — Z992 Dependence on renal dialysis: Secondary | ICD-10-CM | POA: Diagnosis not present

## 2019-01-29 DIAGNOSIS — R232 Flushing: Secondary | ICD-10-CM | POA: Diagnosis not present

## 2019-01-29 DIAGNOSIS — Z79899 Other long term (current) drug therapy: Secondary | ICD-10-CM | POA: Insufficient documentation

## 2019-01-29 DIAGNOSIS — E1122 Type 2 diabetes mellitus with diabetic chronic kidney disease: Secondary | ICD-10-CM | POA: Insufficient documentation

## 2019-01-29 DIAGNOSIS — I132 Hypertensive heart and chronic kidney disease with heart failure and with stage 5 chronic kidney disease, or end stage renal disease: Secondary | ICD-10-CM | POA: Diagnosis not present

## 2019-01-29 DIAGNOSIS — Z794 Long term (current) use of insulin: Secondary | ICD-10-CM | POA: Diagnosis not present

## 2019-01-29 DIAGNOSIS — E039 Hypothyroidism, unspecified: Secondary | ICD-10-CM | POA: Insufficient documentation

## 2019-01-29 DIAGNOSIS — N186 End stage renal disease: Secondary | ICD-10-CM | POA: Insufficient documentation

## 2019-01-29 DIAGNOSIS — Z7982 Long term (current) use of aspirin: Secondary | ICD-10-CM | POA: Insufficient documentation

## 2019-01-29 LAB — CBC WITH DIFFERENTIAL/PLATELET
Abs Immature Granulocytes: 0.18 10*3/uL — ABNORMAL HIGH (ref 0.00–0.07)
Basophils Absolute: 0.1 10*3/uL (ref 0.0–0.1)
Basophils Relative: 1 %
Eosinophils Absolute: 0.1 10*3/uL (ref 0.0–0.5)
Eosinophils Relative: 1 %
HCT: 26.8 % — ABNORMAL LOW (ref 36.0–46.0)
Hemoglobin: 8.6 g/dL — ABNORMAL LOW (ref 12.0–15.0)
Immature Granulocytes: 2 %
Lymphocytes Relative: 9 %
Lymphs Abs: 0.9 10*3/uL (ref 0.7–4.0)
MCH: 34.5 pg — ABNORMAL HIGH (ref 26.0–34.0)
MCHC: 32.1 g/dL (ref 30.0–36.0)
MCV: 107.6 fL — ABNORMAL HIGH (ref 80.0–100.0)
Monocytes Absolute: 0.5 10*3/uL (ref 0.1–1.0)
Monocytes Relative: 5 %
Neutro Abs: 8.5 10*3/uL — ABNORMAL HIGH (ref 1.7–7.7)
Neutrophils Relative %: 82 %
Platelets: 163 10*3/uL (ref 150–400)
RBC: 2.49 MIL/uL — ABNORMAL LOW (ref 3.87–5.11)
RDW: 15.1 % (ref 11.5–15.5)
WBC: 10.3 10*3/uL (ref 4.0–10.5)
nRBC: 0 % (ref 0.0–0.2)

## 2019-01-29 LAB — COMPREHENSIVE METABOLIC PANEL
ALT: 17 U/L (ref 0–44)
AST: 20 U/L (ref 15–41)
Albumin: 3.6 g/dL (ref 3.5–5.0)
Alkaline Phosphatase: 175 U/L — ABNORMAL HIGH (ref 38–126)
Anion gap: 11 (ref 5–15)
BUN: 25 mg/dL — ABNORMAL HIGH (ref 6–20)
CO2: 29 mmol/L (ref 22–32)
Calcium: 8.5 mg/dL — ABNORMAL LOW (ref 8.9–10.3)
Chloride: 97 mmol/L — ABNORMAL LOW (ref 98–111)
Creatinine, Ser: 3.7 mg/dL — ABNORMAL HIGH (ref 0.44–1.00)
GFR calc Af Amer: 15 mL/min — ABNORMAL LOW (ref >60)
GFR calc non Af Amer: 13 mL/min — ABNORMAL LOW (ref >60)
Glucose, Bld: 186 mg/dL — ABNORMAL HIGH (ref 70–99)
Potassium: 4.5 mmol/L (ref 3.5–5.1)
Sodium: 137 mmol/L (ref 135–145)
Total Bilirubin: 0.8 mg/dL (ref 0.3–1.2)
Total Protein: 6.6 g/dL (ref 6.5–8.1)

## 2019-01-29 LAB — LIPASE, BLOOD: Lipase: 26 U/L (ref 11–51)

## 2019-01-29 NOTE — ED Notes (Signed)
Pt father notified pt was being treated in the ER and pt condition and complaints. Father states he feels that there is nothing new going on with pt and he feels that pt complaints are stemming from broke humerus.

## 2019-01-29 NOTE — ED Provider Notes (Signed)
Ct abd pelvis:  IMPRESSION:  1. There is increased attenuation in the left breast with skin  thickening. This is nonspecific given the increased attenuation in  the subcutaneous fat of the left flank and could represent dependent  edema, especially given the patient's history of dialysis. However,  inflammatory breast cancer or mastitis are not excluded on the left.  Recommend dedicated imaging in the breast Center as an outpatient.  2. There is a nodule in the left ovary which is larger compared to  more remote studies but stable since September 2019. Recommend an  ultrasound as an outpatient for further characterization.  3. Cardiomegaly.  4. Liver demonstrates a mildly nodular contour raising the  possibility of subtle cirrhosis.  5. There is a left adrenal nodule which is larger in the interval.  However, it demonstrates central low attenuation suggesting an  adenoma. There is a smaller adenoma on the right.  6. Bilateral renal stones without obstruction.  7. Colonic diverticulosis without diverticulitis.  8. Hernia repair. Persistent fat containing hernia remains.  Low-attenuation nodularity within the herniated fat is stable. Some  rounded regions of low attenuation abutting the peritoneum in the  anterior abdomen are also stable. Findings are nonspecific but  unchanged since September 2019.   CT head was unremarkable, chest x-ray reveals cardiomegaly with central vascular congestion.  Labs appear to be at baseline and are unremarkable.  She will be referred to the breast cancer center for breast imaging on the left side.  She is cleared for outpatient follow-up.   Earleen Newport, MD 01/29/19 [phone]

## 2019-01-29 NOTE — ED Triage Notes (Signed)
Pt arrives via ems from dialysis. Ems report pt had 1/2 dialysis treatment then started having hot and cold flashes and asked to be transported to ER. Pt arrives in NAD, a&o x 4 on arrival. Ems reports ht of grand mal seizures, and pt felt like she was going to have one this morning but did not, also reports pt fell Monday getting into car and has left humeral fracture.

## 2019-01-29 NOTE — ED Notes (Signed)
PT mother Theodis Sato [facility] cell number- [phone]

## 2019-01-29 NOTE — ED Notes (Signed)
Pt currently being transported down to dialysis for them to de-access her port in the left arm.

## 2019-01-29 NOTE — Discharge Instructions (Signed)
You will need imaging of your left breast as an outpatient.

## 2019-01-29 NOTE — ED Provider Notes (Signed)
[facility] Emergency Department Provider Note  ____________________________________________   First MD Initiated Contact with Patient 01/29/19 1423     (approximate)  I have reviewed the triage vital signs and the nursing notes.   HISTORY  Chief Complaint Hot Flashes    HPI Nina Johnston is a 58 y.o. female with seizure history on Lamictal, dialysis through a left arm fistula, diabetes who presents with concern for start of seizure activity.  Patient was halfway through her dialysis when she started having hot flashes and her foot started twitching.  She says this is typically with precipitates her having a grand mal seizure.  Patient did not have a full seizure.  The hot flashes seem to have gone away.  Patient said that she normally stays at home when these happen but because she was at dialysis alone she got scared and wanted to be transported to the ER.  She denies any chest pain, shortness of breath.  She says that her abdomen feels more distended with a little bit of pain that is been constant since dialysis, nothing makes it better, nothing makes it worse.    Past Medical History:  Diagnosis Date  . A-V fistula ([place])   . Acute bacterial sinusitis 04/19/2014  . Adaptive colitis   . Anginal pain ([place])   . Anxiety   . Anxiety and depression   . Arthritis   . Asthma   . 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 Benign essential HTN 12/26/2014  . Bipolar disorder ([place])   . Bronchitis   . Carotid artery narrowing 12/22/2014  . Cervical prolapse   . CHF (congestive heart failure) ([place])   . CKD (chronic kidney disease)   . Concussion 01-14-14   Fall   . COPD (chronic obstructive pulmonary disease) ([place])   . Coronary artery disease   . Depression   . Diabetes mellitus without complication ([place])   . Dialysis patient ([place])   . Diplopia   . Dupuytren's contracture of foot   .  Essential (primary) hypertension 02/12/2015  . Family history of thyroid problem   . GERD (gastroesophageal reflux disease)   . Gout   . Heart disease    enlarged because of COPD  . Heart murmur   . Hepatomegaly   . HLD (hyperlipidemia)   . Hyperactive airway disease   . Hyperkalemia   . Hypertension   . Hyperthyroidism   . Irritable colon   . Morbid obesity ([place])   . Multiple thyroid nodules   . OAB (overactive bladder)   . Pernicious anemia   . Post menopausal syndrome   . Post-concussion syndrome 03/08/2014  . Post-traumatic brain syndrome   . Prolapsed uterus   . PSVT (paroxysmal supraventricular tachycardia) ([place])   . Renal calculus   . Renal disease    w/ GFR 29-may be due to diabetes  . Seizures ([place])    last seizure 2012. has had spells since with no knowledge. last was 1 month ago  . Sleep apnea   . 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 Transient alteration of awareness 11/10/2012  . Urinary incontinence with continuous leakage 03/08/2014    Patient Active Problem List   Diagnosis Date Noted  . Partial symptomatic epilepsy with complex partial seizures, not intractable, without status epilepticus ([place]) 04/07/2018  . Diabetes mellitus with ESRD (  end-stage renal disease) ([place]) 04/07/2018  . Spells of decreased attentiveness 12/02/2017  . Postdialysis syndrome 12/02/2017  . 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[place]) 12/02/2017  . Anemia due to pre-ESRD treated with erythropoietin 09/23/2017  . Hemodialysis access, AV graft ([place]) 09/23/2017  . ESRD on dialysis ([place]) 02/06/2017  . Delay kidney tx func d/t fluid overload requiring acute dialysis ([place]) 12/03/2016  . Menopause 10/22/2016  . Status post endometrial ablation 10/22/2016  . Swelling of limb 04/18/2016  . Kidney dialysis as the cause of abnormal reaction of the patient, or of later complication, without mention of misadventure at the time of the procedure (CODE) 04/18/2016  .  End-stage renal disease on hemodialysis ([place]) 08/24/2015  . OAB (overactive bladder) 06/07/2015  . Incontinence 06/07/2015  . Airway hyperreactivity 06/06/2015  . Binocular vision disorder with diplopia 06/06/2015  . Type 2 diabetes mellitus ([place]) 06/01/2015  . Severe diabetic hypoglycemia ([place]) 04/16/2015  . Convulsions ([place]) 02/12/2015  . Hypoglycemic reaction 02/12/2015  . Other symptoms and signs concerning food and fluid intake 02/12/2015  . Absence of menstruation 02/12/2015  . Absolute anemia 02/12/2015  . Post menopausal syndrome 02/12/2015  . Calculus of kidney 02/12/2015  . Chronic obstructive pulmonary disease ([place]) 02/12/2015  . Bone/cartilage disorder 02/12/2015  . Urinary system disease 02/12/2015  . Encounter for gynecological examination without abnormal finding 02/12/2015  . 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 Essential (primary) hypertension 02/12/2015  . Gout 02/12/2015  . Hernia, internal 02/12/2015  . Hypoglycemia 02/12/2015  . Adaptive colitis 02/12/2015  . Arthritis, degenerative 02/12/2015  . Postprocedural state 02/12/2015  . Dupuytren's contracture of foot 02/12/2015  . Cutaneous eruption 02/12/2015  . 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 Absence of bladder continence 02/12/2015  . Cervical prolapse 02/12/2015  . Seizure ([place]) 02/12/2015  . Benign essential HTN 12/26/2014  . Carotid artery narrowing 12/22/2014  . Chest pain 12/22/2014  . Combined fat and carbohydrate induced hyperlipemia 12/22/2014  . Acute bacterial sinusitis 04/19/2014  . Anemia in chronic illness 03/16/2014  . Urinary incontinence with continuous leakage 03/08/2014  . Post-concussion syndrome 03/08/2014  . Total urinary incontinence 03/08/2014  . 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 Apnea, sleep 01/17/2014  . Patient awaiting renal transplant 06/03/2013  . Bipolar affective disorder ([place]) 12/16/2012  . Acid reflux 12/16/2012   . Class 2 severe obesity due to excess calories with serious comorbidity and body mass index (BMI) of 37.0 to 37.9 in adult ([place]) 12/16/2012  . Obstructive apnea 12/16/2012  . Addison anemia 12/16/2012  . Seizure disorder ([place]) 12/16/2012  . Ataxia 11/10/2012  . 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 Transient alteration of awareness 11/10/2012  . Severe obesity (BMI >= 40) ([place]) 11/10/2012  . Morbid obesity ([place]) 11/10/2012  . Speech disorder 11/10/2012  . Encounter for general adult medical examination without abnormal findings 09/08/2012  . Multinodular goiter 01/29/2012  . Cardiac murmur 10/29/2011  . High potassium 10/28/2011  . Adult hypothyroidism 03/12/2011    Past Surgical History:  Procedure Laterality Date  . A/V FISTULAGRAM Left 07/21/2016   Procedure: A/V Fistulagram;  Surgeon: Algernon Huxley, MD;  Location: [place] [facility];  Service: Cardiovascular;  Laterality: Left;  . A/V FISTULAGRAM Left 12/24/2016   Procedure: A/V Fistulagram;  Surgeon: Algernon Huxley, MD;  Location: [place] [facility];  Service: Cardiovascular;  Laterality: Left;  . A/V FISTULAGRAM Left 06/29/2017   Procedure: A/V FISTULAGRAM;  Surgeon: Algernon Huxley, MD;  Location:  [place] [facility];  Service: Cardiovascular;  Laterality: Left;  . A/V FISTULAGRAM Left 03/08/2018   Procedure: A/V FISTULAGRAM;  Surgeon: Algernon Huxley, MD;  Location: [place];  Service: Cardiovascular;  Laterality: Left;  . A/V SHUNT INTERVENTION N/A 07/21/2016   Procedure: A/V Shunt Intervention;  Surgeon: Algernon Huxley, MD;  Location: [place];  Service: Cardiovascular;  Laterality: N/A;  . A/V SHUNT INTERVENTION N/A 12/24/2016   Procedure: A/V SHUNT INTERVENTION;  Surgeon: Algernon Huxley, MD;  Location: [place];  Service: Cardiovascular;  Laterality: N/A;  . AVF    . CHOLECYSTECTOMY    . COLONOSCOPY    . COLONOSCOPY WITH PROPOFOL N/A 03/18/2017   Procedure: COLONOSCOPY WITH PROPOFOL;  Surgeon:  Manya Silvas, MD;  Location: [facility] ENDOSCOPY;  Service: Endoscopy;  Laterality: N/A;  . ENDOMETRIAL BIOPSY     ablation, uterine  . ESOPHAGOGASTRODUODENOSCOPY (EGD) WITH PROPOFOL N/A 03/18/2017   Procedure: ESOPHAGOGASTRODUODENOSCOPY (EGD) WITH PROPOFOL;  Surgeon: Manya Silvas, MD;  Location: [facility] ENDOSCOPY;  Service: Endoscopy;  Laterality: N/A;  . HERNIA REPAIR    . HERNIA REPAIR    . PERIPHERAL VASCULAR CATHETERIZATION N/A 08/23/2015   Procedure: Dialysis/Perma Catheter Insertion;  Surgeon: Algernon Huxley, MD;  Location: [place] [facility];  Service: Cardiovascular;  Laterality: N/A;  . PERIPHERAL VASCULAR CATHETERIZATION N/A 11/09/2015   Procedure: Dialysis/Perma Catheter Removal;  Surgeon: Katha Cabal, MD;  Location: [place] [facility];  Service: Cardiovascular;  Laterality: N/A;  . PERIPHERAL VASCULAR CATHETERIZATION Left 04/23/2016   Procedure: A/V Fistulagram;  Surgeon: Algernon Huxley, MD;  Location: [place] [facility];  Service: Cardiovascular;  Laterality: Left;  . PORT A CATH REVISION      Prior to Admission medications   Medication Sig Start Date End Date Taking? Authorizing Provider  amLODipine (NORVASC) 5 MG tablet Take 5 mg by mouth See admin instructions. Take 5 mg by mouth daily except do not take on Tuesday, Thursday and Saturday. 11/26/16 12/10/18  [provider]  aspirin 81 MG tablet Take 81 mg by mouth daily.     [provider]  b complex vitamins tablet Take 1 tablet by mouth at bedtime.     [provider]  B Complex-C-Folic Acid (DIALYVITE TABLET) TABS Take 1 tablet by mouth daily. 11/14/17   [provider]  benzonatate (TESSALON) 200 MG capsule Take by mouth. 10/30/17   [provider]  clonazePAM (KLONOPIN) 1 MG tablet Take 1-2 tablets (1-2 mg total) by mouth daily. 10/20/18   Dohmeier, Asencion Partridge, MD  cloNIDine (CATAPRES) 0.1 MG tablet TAKE 1 TABLET (0.1 MG TOTAL) BY MOUTH 2 (TWO) TIMES DAILY 09/06/18   [provider]  cyanocobalamin (,VITAMIN B-12,) 1000 MCG/ML injection Inject 1,000 mcg into the muscle every 14 (fourteen) days.     [provider]  epoetin alfa (EPOGEN,PROCRIT) 3000 UNIT/ML injection Inject 3,000 Units into the vein. Every tues, thur and sat with dialysis    [provider]  ethosuximide (ZARONTIN) 250 MG/5ML solution 5 ml bid 12/08/18   Dohmeier, Asencion Partridge, MD  Febuxostat 80 MG TABS Take 80 mg by mouth daily.     [provider]  fexofenadine (ALLEGRA) 180 MG tablet Take 180 mg by mouth daily.     [provider]  Fluticasone-Salmeterol (ADVAIR DISKUS) 250-50 MCG/DOSE AEPB Inhale 1 puff into the lungs 2 (two) times daily.    [provider]  furosemide (LASIX) 20 MG tablet  Take 30 mg by mouth 2 (two) times daily.     [provider]  hydrALAZINE (APRESOLINE) 100 MG tablet Take 50 mg by mouth See admin instructions. Take 100 mg by mouth twice daily on non-dialysis days. Take 100 mg by mouth at bedtime on dialysis days Tuesday, Thursday and Saturday    [provider]  insulin glargine (LANTUS) 100 UNIT/ML injection Inject 22 Units into the skin at bedtime.     [provider]  insulin lispro (HUMALOG) 100 UNIT/ML injection Inject 10-15 Units into the skin 3 (three) times daily with meals. Per sliding scale    [provider]  LAMICTAL 200 MG tablet Take 1.5 tablets (300 mg total) by mouth daily. 10/20/18   Dohmeier, Asencion Partridge, MD  levETIRAcetam (KEPPRA) 250 MG tablet Take Bid on non -dialysis days, 3 a day on hemodialysis days. 10/20/18   Dohmeier, Asencion Partridge, MD  levothyroxine (SYNTHROID) 150 MCG tablet Take 150 mcg by mouth daily before breakfast.     [provider]  metoprolol succinate (TOPROL XL) 50 MG 24 hr tablet Take 1 tablet (50 mg total) by mouth 2 (two) times daily. Take with or immediately following a meal. Patient taking differently: Take 75 mg by mouth See admin instructions. Take 75 mg by  mouth twice daily on non dialysis days. Take 75 mg by mouth at bedtime on dialysis days Tuesday, Thursday and Saturday. 04/16/16   Dohmeier, Asencion Partridge, MD  montelukast (SINGULAIR) 10 MG tablet Take 10 mg by mouth at bedtime.    [provider]  nystatin-triamcinolone (MYCOLOG II) cream Apply 1 application topically 2 (two) times daily. Patient taking differently: Apply 1 application topically daily as needed (rash).  10/22/16   Defrancesco, Alanda Slim, MD  ondansetron (ZOFRAN) 4 MG tablet Take 1 tablet (4 mg total) by mouth every 8 (eight) hours as needed for up to 5 days for nausea or vomiting. 01/24/19 01/29/19  Lannie Fields, PA-C  pantoprazole (PROTONIX) 40 MG tablet Take 40 mg by mouth every morning.     [provider]  Probiotic Product (ALIGN) 4 MG CAPS Take 4 mg by mouth daily.     [provider]  triamcinolone (NASACORT ALLERGY 24HR) 55 MCG/ACT AERO nasal inhaler Place 2 sprays into the nose at bedtime.     [provider]  UNIFINE PENTIPS 32G X 4 MM MISC USE UP TO 5 TIMES DAILY AS DIRECTED 11/22/17   [provider]    Allergies Cinnamon, Procrit [epoetin alfa], Ace inhibitors, Azithromycin, Iron, Neomycin-bacitracin zn-polymyx, and Sulfa antibiotics  Family History  Problem Relation Age of Onset  . Diabetes Mother   . Lumbar disc disease Mother   . Migraines Mother   . Hypertension Mother   . Cancer - Prostate Father        mild  . Diabetes Father   . Hypertension Father   . Breast cancer Neg Hx   . Ovarian cancer Neg Hx   . Colon cancer Neg Hx   . Heart disease Neg Hx   . Kidney cancer Neg Hx   . Bladder Cancer Neg Hx     Social History Social History   Tobacco Use  . Smoking status: Never Smoker  . Smokeless tobacco: Never Used  Substance Use Topics  . Alcohol use: No  . Drug use: No      Review of Systems Constitutional: No fever/chills positive hot flash Eyes: No visual changes. ENT: No sore throat.  Cardiovascular: Denies chest pain. Respiratory:  Denies shortness of breath. Gastrointestinal: Positive abdominal pain. Genitourinary: Negative for dysuria. Musculoskeletal: Negative for back pain. Skin: Negative for rash. Neurological: Negative for headaches, focal weakness or numbness.  Positive concern for start of seizure All other ROS negative ____________________________________________   PHYSICAL EXAM:  VITAL SIGNS: Blood pressure (!) 147/64, pulse 76, temperature (!) 97.5 F (36.4 C), temperature source Oral, resp. rate 20, height 5\' 2"  (1.575 m), weight 93 kg, SpO2 97 %.  Constitutional: Alert and oriented. Well appearing and in no acute distress. Eyes: Conjunctivae are normal. EOMI. Head: Atraumatic. Nose: No congestion/rhinnorhea. Mouth/Throat: Mucous membranes are moist.   Neck: No stridor. Trachea Midline. FROM Cardiovascular: Normal rate, regular rhythm. Grossly normal heart sounds.  Good peripheral circulation. Respiratory: Normal respiratory effort.  No retractions. Lungs CTAB. Gastrointestinal: Slightly distended and and tender no rebound or guarding. No abdominal bruits.  Musculoskeletal: Left arm in sling..  Left arm fistula with access still in it Neurologic:  Normal speech and language. No gross focal neurologic deficits are appreciated.  Skin:  Skin is warm, dry and intact. No rash noted. Psychiatric: Mood and affect are normal. Speech and behavior are normal. GU: Deferred   ____________________________________________   LABS (all labs ordered are listed, but only abnormal results are displayed)  Labs Reviewed  CBC WITH DIFFERENTIAL/PLATELET - Abnormal; Notable for the following components:      Result Value   RBC 2.49 (*)    Hemoglobin 8.6 (*)    HCT 26.8 (*)    MCV 107.6 (*)    MCH 34.5 (*)    Neutro Abs 8.5 (*)    Abs Immature Granulocytes 0.18 (*)    All other components within normal limits  COMPREHENSIVE METABOLIC PANEL  LIPASE, BLOOD    ____________________________________________   ED ECG REPORT I, Vanessa [place], the attending physician, personally viewed and interpreted this ECG.  EKG is normal sinus rate of 76, no ST elevation, no T wave inversion, normal intervals ____________________________________________  RADIOLOGY  Pending  ____________________________________________   PROCEDURES  Procedure(s) performed (including Critical Care):  Procedures   ____________________________________________   INITIAL IMPRESSION / ASSESSMENT AND PLAN / ED COURSE  Nina Johnston was evaluated in Emergency Department on 01/29/2019 for the symptoms described in the history of present illness. She was evaluated in the context of the global COVID-19 pandemic, which necessitated consideration that the patient might be at risk for infection with the SARS-CoV-2 virus that causes COVID-19. Institutional protocols and algorithms that pertain to the evaluation of patients at risk for COVID-19 are in a state of rapid change based on information released by regulatory bodies including the CDC and federal and state organizations. These policies and algorithms were followed during the patient's care in the ED.    Patient is a well-appearing 58 year old who presents with concerns of hot flashes and feeling like she is about to have a seizure.  Patient did not have a full-blown seizure she is.  She says normally these happen at home and she just stays at home.  Will get a CT head given patient does was having a little bit of a headache to make sure there is no evidence of mass, edema.  Also get CT abdomen given patient endorses worsening distention and some abdominal pain to rule out SBO, appendicitis.  Patient's exam overall is very reassuring.  Will get labs to evaluate for electrolyte abnormalities.  Patient did not miss any doses of her Lamictal.  White count is normal.  Hemoglobin is 8.6.  Patient handed off to oncoming team pending CT  scans and reevaluation. ____________________________________________   FINAL CLINICAL IMPRESSION(S) / ED DIAGNOSES   Final diagnoses:  Hot flashes      MEDICATIONS GIVEN DURING THIS VISIT:  Medications - No data to display   ED Discharge Orders    None       Note:  This document was prepared using Dragon voice recognition software and may include unintentional dictation errors.   Vanessa , MD 01/29/19 [phone]

## 2019-01-29 NOTE — ED Notes (Signed)
Pt transported from dialysis to car for discharge.

## 2019-02-02 ENCOUNTER — Encounter: Payer: Self-pay | Admitting: Urology

## 2019-02-02 ENCOUNTER — Ambulatory Visit: Payer: Medicare Other | Admitting: Urology

## 2019-02-07 ENCOUNTER — Ambulatory Visit
Admission: RE | Admit: 2019-02-07 | Discharge: 2019-02-07 | Disposition: A | Discharge status: Home / Self Care | Payer: Medicare Other | Source: Ambulatory Visit | Attending: Student | Admitting: Student

## 2019-02-07 ENCOUNTER — Other Ambulatory Visit: Payer: Self-pay | Admitting: Student

## 2019-02-07 ENCOUNTER — Other Ambulatory Visit: Payer: Self-pay

## 2019-02-07 ENCOUNTER — Other Ambulatory Visit (HOSPITAL_COMMUNITY): Payer: Self-pay | Admitting: Student

## 2019-02-07 DIAGNOSIS — S42202A Unspecified fracture of upper end of left humerus, initial encounter for closed fracture: Secondary | ICD-10-CM | POA: Diagnosis not present

## 2019-03-25 ENCOUNTER — Ambulatory Visit (INDEPENDENT_AMBULATORY_CARE_PROVIDER_SITE_OTHER): Payer: Medicare Other | Admitting: Nurse Practitioner

## 2019-03-25 ENCOUNTER — Ambulatory Visit (INDEPENDENT_AMBULATORY_CARE_PROVIDER_SITE_OTHER): Payer: Medicare Other

## 2019-03-25 ENCOUNTER — Other Ambulatory Visit: Payer: Self-pay

## 2019-03-25 ENCOUNTER — Encounter (INDEPENDENT_AMBULATORY_CARE_PROVIDER_SITE_OTHER): Payer: Self-pay | Admitting: Nurse Practitioner

## 2019-03-25 VITALS — BP 131/70 | HR 76 | Resp 16 | Wt 204.4 lb

## 2019-03-25 DIAGNOSIS — Z992 Dependence on renal dialysis: Secondary | ICD-10-CM

## 2019-03-25 DIAGNOSIS — E1122 Type 2 diabetes mellitus with diabetic chronic kidney disease: Secondary | ICD-10-CM | POA: Diagnosis not present

## 2019-03-25 DIAGNOSIS — I10 Essential (primary) hypertension: Secondary | ICD-10-CM | POA: Diagnosis not present

## 2019-03-25 DIAGNOSIS — N186 End stage renal disease: Secondary | ICD-10-CM

## 2019-03-27 ENCOUNTER — Encounter (INDEPENDENT_AMBULATORY_CARE_PROVIDER_SITE_OTHER): Payer: Self-pay | Admitting: Nurse Practitioner

## 2019-03-27 NOTE — Progress Notes (Signed)
SUBJECTIVE:  Patient ID: Nina Johnston, female    DOB: 1960/05/23, 58 y.o.   MRN: [id number] No chief complaint on file.   HPI  Nina Johnston is a 58 y.o. female The patient returns to the office for follow up regarding problem with the dialysis access. Currently the patient is maintained via a left radial cephalic AVF. The patient has had multiple failed upper extremity accesses.  The patient notes a significant increase in bleeding time after decannulation.  The patient reports continued bleeding greater than 24 hours post dialysis.  The patient denies hand pain or other symptoms consistent with steal phenomena.  No significant arm swelling.  The patient denies redness or swelling at the access site. The patient denies fever or chills at home or while on dialysis.  The patient denies amaurosis fugax or recent TIA symptoms. There are no recent neurological changes noted. The patient denies claudication symptoms or rest pain symptoms. The patient denies history of DVT, PE or superficial thrombophlebitis. The patient denies recent episodes of angina or shortness of breath.   The patient underwent hemodialysis duplex revealed a flow volume of 1133.  This is decreased from the previous study.  It is also noted that there is increased velocity at the AV fistula anastomosis.  There are no other areas of significant velocity increases.   Past Medical History:  Diagnosis Date  . A-V fistula ([place])   . Acute bacterial sinusitis 04/19/2014  . Adaptive colitis   . Anginal pain ([place])   . Anxiety   . Anxiety and depression   . Arthritis   . Asthma   . 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 Benign essential HTN 12/26/2014  . Bipolar disorder ([place])   . Bronchitis   . Carotid artery narrowing 12/22/2014  . Cervical prolapse   . CHF (congestive heart failure) ([place])   . CKD (chronic kidney disease)   . Concussion  01-14-14   Fall   . COPD (chronic obstructive pulmonary disease) ([place])   . Coronary artery disease   . Depression   . Diabetes mellitus without complication ([place])   . Dialysis patient ([place])   . Diplopia   . Dupuytren's contracture of foot   . Essential (primary) hypertension 02/12/2015  . Family history of thyroid problem   . GERD (gastroesophageal reflux disease)   . Gout   . Heart disease    enlarged because of COPD  . Heart murmur   . Hepatomegaly   . HLD (hyperlipidemia)   . Hyperactive airway disease   . Hyperkalemia   . Hypertension   . Hyperthyroidism   . Irritable colon   . Morbid obesity ([place])   . Multiple thyroid nodules   . OAB (overactive bladder)   . Pernicious anemia   . Post menopausal syndrome   . Post-concussion syndrome 03/08/2014  . Post-traumatic brain syndrome   . Prolapsed uterus   . PSVT (paroxysmal supraventricular tachycardia) ([place])   . Renal calculus   . Renal disease    w/ GFR 29-may be due to diabetes  . Seizures ([place])    last seizure 2012. has had spells since with no knowledge. last was 1 month ago  . Sleep apnea   . 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 Transient alteration of awareness 11/10/2012  . Urinary incontinence with continuous leakage 03/08/2014  Past Surgical History:  Procedure Laterality Date  . A/V FISTULAGRAM Left 07/21/2016   Procedure: A/V Fistulagram;  Surgeon: Algernon Huxley, MD;  Location: [facility];  Service: Cardiovascular;  Laterality: Left;  . A/V FISTULAGRAM Left 12/24/2016   Procedure: A/V Fistulagram;  Surgeon: Algernon Huxley, MD;  Location: [place] [facility] [facility];  Service: Cardiovascular;  Laterality: Left;  . A/V FISTULAGRAM Left 06/29/2017   Procedure: A/V FISTULAGRAM;  Surgeon: Algernon Huxley, MD;  Location: [place] [facility];  Service: Cardiovascular;  Laterality: Left;  . A/V FISTULAGRAM Left 03/08/2018   Procedure: A/V FISTULAGRAM;  Surgeon: Algernon Huxley, MD;   Location: [place];  Service: Cardiovascular;  Laterality: Left;  . A/V SHUNT INTERVENTION N/A 07/21/2016   Procedure: A/V Shunt Intervention;  Surgeon: Algernon Huxley, MD;  Location: [place] [facility];  Service: Cardiovascular;  Laterality: N/A;  . A/V SHUNT INTERVENTION N/A 12/24/2016   Procedure: A/V SHUNT INTERVENTION;  Surgeon: Algernon Huxley, MD;  Location: [facility];  Service: Cardiovascular;  Laterality: N/A;  . AVF    . CHOLECYSTECTOMY    . COLONOSCOPY    . COLONOSCOPY WITH PROPOFOL N/A 03/18/2017   Procedure: COLONOSCOPY WITH PROPOFOL;  Surgeon: Manya Silvas, MD;  Location: [facility] ENDOSCOPY;  Service: Endoscopy;  Laterality: N/A;  . ENDOMETRIAL BIOPSY     ablation, uterine  . ESOPHAGOGASTRODUODENOSCOPY (EGD) WITH PROPOFOL N/A 03/18/2017   Procedure: ESOPHAGOGASTRODUODENOSCOPY (EGD) WITH PROPOFOL;  Surgeon: Manya Silvas, MD;  Location: [facility] ENDOSCOPY;  Service: Endoscopy;  Laterality: N/A;  . HERNIA REPAIR    . HERNIA REPAIR    . PERIPHERAL VASCULAR CATHETERIZATION N/A 08/23/2015   Procedure: Dialysis/Perma Catheter Insertion;  Surgeon: Algernon Huxley, MD;  Location: [place] [facility];  Service: Cardiovascular;  Laterality: N/A;  . PERIPHERAL VASCULAR CATHETERIZATION N/A 11/09/2015   Procedure: Dialysis/Perma Catheter Removal;  Surgeon: Katha Cabal, MD;  Location: [place] [facility];  Service: Cardiovascular;  Laterality: N/A;  . PERIPHERAL VASCULAR CATHETERIZATION Left 04/23/2016   Procedure: A/V Fistulagram;  Surgeon: Algernon Huxley, MD;  Location: [place] [facility];  Service: Cardiovascular;  Laterality: Left;  . PORT A CATH REVISION      Social History   Socioeconomic History  . Marital status: Single    Spouse name: Not on file  . Number of children: 0  . Years of education:  college  . Highest education level: Not on file  Occupational History  . 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 Financial resource strain: Not very hard  .  Food insecurity    Worry: Sometimes true    Inability: Sometimes true  . Transportation needs    Medical: No    Non-medical: No  Tobacco Use  . Smoking status: Never Smoker  . Smokeless tobacco: Never Used  Substance and Sexual Activity  . Alcohol use: No  . Drug use: No  . Sexual activity: Not Currently  Lifestyle  . Physical activity    Days per week: 0 days    Minutes per session: Not on file  . Stress: Not at all  Relationships  . Social connections    Talks on phone: More than three times a week    Gets together: More than three times a week    Attends religious service: More than 4 times per year    Active member of club or organization: Yes    Attends meetings of clubs or  organizations: 1 to 4 times per year    Relationship status: Divorced  . Intimate partner violence    Fear of current or ex partner: No    Emotionally abused: No    Physically abused: No    Forced sexual activity: No  Other Topics Concern  . Not on file  Social History Narrative   Patient is single and lives with her parents.   Patient is disabled.   Patient has a college education.   Patient is right- handed.   Patient drinks tea occasionally. 2 glasses of tea when they eat out.    Family History  Problem Relation Age of Onset  . Diabetes Mother   . Lumbar disc disease Mother   . Migraines Mother   . Hypertension Mother   . Cancer - Prostate Father        mild  . Diabetes Father   . Hypertension Father   . Breast cancer Neg Hx   . Ovarian cancer Neg Hx   . Colon cancer Neg Hx   . Heart disease Neg Hx   . Kidney cancer Neg Hx   . Bladder Cancer Neg Hx     Allergies  Allergen Reactions  . Cinnamon Other (See Comments)    Raises blood pressure  . Procrit [Epoetin Alfa] Other (See Comments)    Seizures, grand mal  . Ace Inhibitors Cough  . Azithromycin Other (See Comments)    seizures  . Iron Other (See Comments)    Can not take in IV Form - causes seizures   .  Neomycin-Bacitracin Zn-Polymyx Other (See Comments)    Unknown  . Sulfa Antibiotics Other (See Comments)    Due to risks for seizures.     Review of Systems   Review of Systems: Negative Unless Checked Constitutional: [] Weight loss  [] Fever  [] Chills Cardiac: [] Chest pain   []  Atrial Fibrillation  [] Palpitations   [] Shortness of breath when laying flat   [] Shortness of breath with exertion. [] Shortness of breath at rest Vascular:  [] Pain in legs with walking   [] Pain in legs with standing [] Pain in legs when laying flat   [] Claudication    [] Pain in feet when laying flat    [] History of DVT   [] Phlebitis   [] Swelling in legs   [] Varicose veins   [] Non-healing ulcers Pulmonary:   [] Uses home oxygen   [] Productive cough   [] Hemoptysis   [] Wheeze  [x] COPD   [] Asthma Neurologic:  [] Dizziness   [x] Seizures  [] Blackouts [] History of stroke   [] History of TIA  [] Aphasia   [] Temporary Blindness   [] Weakness or numbness in arm   [] Weakness or numbness in leg Musculoskeletal:   [] Joint swelling   [] Joint pain   [] Low back pain  []  History of Knee Replacement [x] Arthritis [] back Surgeries  []  Spinal Stenosis    Hematologic:  [] Easy bruising  [] Easy bleeding   [] Hypercoagulable state   [x] Anemic Gastrointestinal:  [] Diarrhea   [] Vomiting  [x] Gastroesophageal reflux/heartburn   [] Difficulty swallowing. [] Abdominal pain Genitourinary:  [] Chronic kidney disease   [] Difficult urination  [] Anuric   [] Blood in urine [] Frequent urination  [] Burning with urination   [] Hematuria Skin:  [] Rashes   [] Ulcers [] Wounds Psychological:  [] History of anxiety   []  History of major depression  [x]  Memory Difficulties      OBJECTIVE:   Physical Exam  BP 131/70 (BP Location: Right Arm)   Pulse 76   Resp 16   Wt 204 lb 6.4 oz (92.7 kg)  BMI 37.39 kg/m   Gen: WD/WN, NAD Head: [place]/AT, No temporalis wasting.  Ear/Nose/Throat: Hearing grossly intact, nares w/o erythema or drainage Eyes: PER, EOMI, sclera nonicteric.   Neck: Supple, no masses.  No JVD.  Pulmonary:  Good air movement, no use of accessory muscles.  Cardiac: RRR Vascular:  Good thrill and bruit Vessel Right Left  Radial Palpable Palpable   Gastrointestinal: soft, non-distended. No guarding/no peritoneal signs.  Musculoskeletal: M/S 5/5 throughout.  No deformity or atrophy.  Neurologic: Pain and light touch intact in extremities.  Symmetrical.  Speech is fluent. Motor exam as listed above. Psychiatric: Judgment intact, Mood & affect appropriate for pt's clinical situation. Dermatologic: No Venous rashes. No Ulcers Noted.  No changes consistent with cellulitis. Lymph : No Cervical lymphadenopathy, no lichenification or skin changes of chronic lymphedema.       ASSESSMENT AND PLAN:  1. End-stage renal disease on hemodialysis [facility]) Recommend:  The patient is experiencing increasing problems with their dialysis access.  Patient should have a fistulagram with the intention for intervention.  The intention for intervention is to restore appropriate flow and prevent thrombosis and possible loss of the access.  As well as improve the quality of dialysis therapy.  The risks, benefits and alternative therapies were reviewed in detail with the patient.  All questions were answered.  The patient agrees to proceed with angio/intervention.      2. Type 2 diabetes mellitus with chronic kidney disease on chronic dialysis, unspecified whether long term insulin use ([place]) Continue hypoglycemic medications as already ordered, these medications have been reviewed and there are no changes at this time.  Hgb A1C to be monitored as already arranged by primary service   3. Benign essential HTN Continue antihypertensive medications as already ordered, these medications have been reviewed and there are no changes at this time.    Current Outpatient Medications on File Prior to Visit  Medication Sig Dispense Refill  . aspirin 81 MG tablet Take 81 mg by  mouth daily.     Marland Kitchen b complex vitamins tablet Take 1 tablet by mouth at bedtime.     . B Complex-C-Folic Acid (DIALYVITE TABLET) TABS Take 1 tablet by mouth daily.  12  . benzonatate (TESSALON) 200 MG capsule Take by mouth.    . clonazePAM (KLONOPIN) 1 MG tablet Take 1-2 tablets (1-2 mg total) by mouth daily. 180 tablet 1  . cloNIDine (CATAPRES) 0.1 MG tablet TAKE 1 TABLET (0.1 MG TOTAL) BY MOUTH 2 (TWO) TIMES DAILY    . cyanocobalamin (,VITAMIN B-12,) 1000 MCG/ML injection Inject 1,000 mcg into the muscle every 14 (fourteen) days.     Marland Kitchen epoetin alfa (EPOGEN,PROCRIT) 3000 UNIT/ML injection Inject 3,000 Units into the vein. Every tues, thur and sat with dialysis    . ethosuximide (ZARONTIN) 250 MG/5ML solution 5 ml bid 473 mL 3  . Febuxostat 80 MG TABS Take 80 mg by mouth daily.     . fexofenadine (ALLEGRA) 180 MG tablet Take 180 mg by mouth daily.     . Fluticasone-Salmeterol (ADVAIR DISKUS) 250-50 MCG/DOSE AEPB Inhale 1 puff into the lungs 2 (two) times daily.    . furosemide (LASIX) 20 MG tablet Take 30 mg by mouth 2 (two) times daily.     . hydrALAZINE (APRESOLINE) 100 MG tablet Take 50 mg by mouth See admin instructions. Take 100 mg by mouth twice daily on non-dialysis days. Take 100 mg by mouth at bedtime on dialysis days Tuesday, Thursday and Saturday    .  insulin glargine (LANTUS) 100 UNIT/ML injection Inject 22 Units into the skin at bedtime.     . insulin lispro (HUMALOG) 100 UNIT/ML injection Inject 10-15 Units into the skin 3 (three) times daily with meals. Per sliding scale    . LAMICTAL 200 MG tablet Take 1.5 tablets (300 mg total) by mouth daily. 270 tablet 1  . levETIRAcetam (KEPPRA) 250 MG tablet Take Bid on non -dialysis days, 3 a day on hemodialysis days. 270 tablet 5  . levothyroxine (SYNTHROID) 150 MCG tablet Take 150 mcg by mouth daily before breakfast.     . metoprolol succinate (TOPROL XL) 50 MG 24 hr tablet Take 1 tablet (50 mg total) by mouth 2 (two) times daily. Take with  or immediately following a meal. (Patient taking differently: Take 75 mg by mouth See admin instructions. Take 75 mg by mouth twice daily on non dialysis days. Take 75 mg by mouth at bedtime on dialysis days Tuesday, Thursday and Saturday.) 60 tablet 11  . montelukast (SINGULAIR) 10 MG tablet Take 10 mg by mouth at bedtime.    Marland Kitchen nystatin-triamcinolone (MYCOLOG II) cream Apply 1 application topically 2 (two) times daily. (Patient taking differently: Apply 1 application topically daily as needed (rash). ) 30 g 1  . pantoprazole (PROTONIX) 40 MG tablet Take 40 mg by mouth every morning.     . Probiotic Product (ALIGN) 4 MG CAPS Take 4 mg by mouth daily.     Marland Kitchen triamcinolone (NASACORT ALLERGY 24HR) 55 MCG/ACT AERO nasal inhaler Place 2 sprays into the nose at bedtime.     Marland Kitchen UNIFINE PENTIPS 32G X 4 MM MISC USE UP TO 5 TIMES DAILY AS DIRECTED  3  . amLODipine (NORVASC) 5 MG tablet Take 5 mg by mouth See admin instructions. Take 5 mg by mouth daily except do not take on Tuesday, Thursday and Saturday.    Marland Kitchen HYDROcodone-acetaminophen (NORCO/VICODIN) 5-325 MG tablet Take 1 tablet by mouth every 6 (six) hours as needed.     No current facility-administered medications on file prior to visit.     There are no Patient Instructions on file for this visit. No follow-ups on file.   Kris Hartmann, NP  This note was completed with Sales executive.  Any errors are purely unintentional.

## 2019-03-28 ENCOUNTER — Telehealth (INDEPENDENT_AMBULATORY_CARE_PROVIDER_SITE_OTHER): Payer: Self-pay

## 2019-03-28 NOTE — Telephone Encounter (Signed)
Spoke with the patient's mother and the patient is now scheduled for fistulagram with Dr. Lucky Cowboy on 04/11/2019 with a 6:45 am arrival time to the [facility]. Patient will do covid testing on 04/07/2019 at 8:00 am at the [facility]. Pre-procedure instructions were discussed and will be mailed to the patient.

## 2019-04-07 ENCOUNTER — Other Ambulatory Visit: Payer: Self-pay

## 2019-04-07 ENCOUNTER — Other Ambulatory Visit
Admission: RE | Admit: 2019-04-07 | Discharge: 2019-04-07 | Disposition: A | Discharge status: Home / Self Care | Payer: Medicare Other | Source: Ambulatory Visit | Attending: Vascular Surgery | Admitting: Vascular Surgery

## 2019-04-07 ENCOUNTER — Other Ambulatory Visit (INDEPENDENT_AMBULATORY_CARE_PROVIDER_SITE_OTHER): Payer: Self-pay | Admitting: Nurse Practitioner

## 2019-04-07 DIAGNOSIS — Z01812 Encounter for preprocedural laboratory examination: Secondary | ICD-10-CM | POA: Diagnosis present

## 2019-04-07 DIAGNOSIS — Z20828 Contact with and (suspected) exposure to other viral communicable diseases: Secondary | ICD-10-CM | POA: Diagnosis not present

## 2019-04-07 LAB — SARS CORONAVIRUS 2 (TAT 6-24 HRS): SARS Coronavirus 2: NEGATIVE

## 2019-04-11 ENCOUNTER — Encounter
Admission: RE | Disposition: A | Discharge status: Home / Self Care | Payer: Self-pay | Source: Home / Self Care | Attending: Vascular Surgery

## 2019-04-11 ENCOUNTER — Ambulatory Visit
Admission: RE | Admit: 2019-04-11 | Discharge: 2019-04-11 | Disposition: A | Discharge status: Home / Self Care | Payer: Medicare Other | Attending: Vascular Surgery | Admitting: Vascular Surgery

## 2019-04-11 ENCOUNTER — Other Ambulatory Visit: Payer: Self-pay

## 2019-04-11 DIAGNOSIS — I132 Hypertensive heart and chronic kidney disease with heart failure and with stage 5 chronic kidney disease, or end stage renal disease: Secondary | ICD-10-CM | POA: Diagnosis not present

## 2019-04-11 DIAGNOSIS — N186 End stage renal disease: Secondary | ICD-10-CM

## 2019-04-11 DIAGNOSIS — I2510 Atherosclerotic heart disease of native coronary artery without angina pectoris: Secondary | ICD-10-CM | POA: Insufficient documentation

## 2019-04-11 DIAGNOSIS — E785 Hyperlipidemia, unspecified: Secondary | ICD-10-CM | POA: Diagnosis not present

## 2019-04-11 DIAGNOSIS — Z6837 Body mass index (BMI) 37.0-37.9, adult: Secondary | ICD-10-CM | POA: Insufficient documentation

## 2019-04-11 DIAGNOSIS — Y841 Kidney dialysis as the cause of abnormal reaction of the patient, or of later complication, without mention of misadventure at the time of the procedure: Secondary | ICD-10-CM | POA: Diagnosis not present

## 2019-04-11 DIAGNOSIS — Z79899 Other long term (current) drug therapy: Secondary | ICD-10-CM | POA: Insufficient documentation

## 2019-04-11 DIAGNOSIS — R569 Unspecified convulsions: Secondary | ICD-10-CM | POA: Insufficient documentation

## 2019-04-11 DIAGNOSIS — E1122 Type 2 diabetes mellitus with diabetic chronic kidney disease: Secondary | ICD-10-CM | POA: Insufficient documentation

## 2019-04-11 DIAGNOSIS — E0590 Thyrotoxicosis, unspecified without thyrotoxic crisis or storm: Secondary | ICD-10-CM | POA: Insufficient documentation

## 2019-04-11 DIAGNOSIS — K219 Gastro-esophageal reflux disease without esophagitis: Secondary | ICD-10-CM | POA: Diagnosis not present

## 2019-04-11 DIAGNOSIS — T82898A Other specified complication of vascular prosthetic devices, implants and grafts, initial encounter: Secondary | ICD-10-CM

## 2019-04-11 DIAGNOSIS — Z7982 Long term (current) use of aspirin: Secondary | ICD-10-CM | POA: Insufficient documentation

## 2019-04-11 DIAGNOSIS — M109 Gout, unspecified: Secondary | ICD-10-CM | POA: Diagnosis not present

## 2019-04-11 DIAGNOSIS — G4730 Sleep apnea, unspecified: Secondary | ICD-10-CM | POA: Diagnosis not present

## 2019-04-11 DIAGNOSIS — I509 Heart failure, unspecified: Secondary | ICD-10-CM | POA: Insufficient documentation

## 2019-04-11 DIAGNOSIS — Z794 Long term (current) use of insulin: Secondary | ICD-10-CM | POA: Insufficient documentation

## 2019-04-11 DIAGNOSIS — Z992 Dependence on renal dialysis: Secondary | ICD-10-CM | POA: Insufficient documentation

## 2019-04-11 DIAGNOSIS — T82510A Breakdown (mechanical) of surgically created arteriovenous fistula, initial encounter: Secondary | ICD-10-CM | POA: Insufficient documentation

## 2019-04-11 DIAGNOSIS — Z79890 Hormone replacement therapy (postmenopausal): Secondary | ICD-10-CM | POA: Insufficient documentation

## 2019-04-11 DIAGNOSIS — J449 Chronic obstructive pulmonary disease, unspecified: Secondary | ICD-10-CM | POA: Insufficient documentation

## 2019-04-11 HISTORY — PX: A/V FISTULAGRAM: CATH118298

## 2019-04-11 LAB — GLUCOSE, CAPILLARY: Glucose-Capillary: 162 mg/dL — ABNORMAL HIGH (ref 70–99)

## 2019-04-11 LAB — POTASSIUM (ARMC VASCULAR LAB ONLY): Potassium (ARMC vascular lab): 4.6 (ref 3.5–5.1)

## 2019-04-11 SURGERY — A/V FISTULAGRAM
Anesthesia: Moderate Sedation | Laterality: Left

## 2019-04-11 MED ORDER — DIPHENHYDRAMINE HCL 50 MG/ML IJ SOLN: 50.0000 mg | Freq: Once | INTRAMUSCULAR | Status: DC | PRN

## 2019-04-11 MED ORDER — ONDANSETRON HCL 4 MG/2ML IJ SOLN: 4.0000 mg | Freq: Four times a day (QID) | INTRAMUSCULAR | Status: DC | PRN

## 2019-04-11 MED ORDER — FAMOTIDINE 20 MG PO TABS: 40.0000 mg | ORAL_TABLET | Freq: Once | ORAL | Status: DC | PRN

## 2019-04-11 MED ORDER — FENTANYL CITRATE (PF) 100 MCG/2ML IJ SOLN
INTRAMUSCULAR | Status: AC
  Filled 2019-04-11: qty 2

## 2019-04-11 MED ORDER — CEFAZOLIN SODIUM-DEXTROSE 1-4 GM/50ML-% IV SOLN
1.0000 g | Freq: Once | INTRAVENOUS | Status: AC
  Administered 2019-04-11: 1 g via INTRAVENOUS

## 2019-04-11 MED ORDER — FENTANYL CITRATE (PF) 100 MCG/2ML IJ SOLN
INTRAMUSCULAR | Status: DC | PRN
  Administered 2019-04-11 (×3): 50 ug via INTRAVENOUS

## 2019-04-11 MED ORDER — IODIXANOL 320 MG/ML IV SOLN
INTRAVENOUS | Status: DC | PRN
  Administered 2019-04-11: 35 mL via INTRA_ARTERIAL

## 2019-04-11 MED ORDER — MIDAZOLAM HCL 5 MG/5ML IJ SOLN
INTRAMUSCULAR | Status: AC
  Filled 2019-04-11: qty 5

## 2019-04-11 MED ORDER — HYDROMORPHONE HCL 1 MG/ML IJ SOLN: 1.0000 mg | Freq: Once | INTRAMUSCULAR | Status: DC | PRN

## 2019-04-11 MED ORDER — MIDAZOLAM HCL 2 MG/ML PO SYRP: 8.0000 mg | ORAL_SOLUTION | Freq: Once | ORAL | Status: DC | PRN

## 2019-04-11 MED ORDER — SODIUM CHLORIDE 0.9 % IV SOLN: INTRAVENOUS | Status: DC

## 2019-04-11 MED ORDER — MIDAZOLAM HCL 2 MG/2ML IJ SOLN
INTRAMUSCULAR | Status: DC | PRN
  Administered 2019-04-11: 2 mg via INTRAVENOUS
  Administered 2019-04-11 (×2): 1 mg via INTRAVENOUS

## 2019-04-11 MED ORDER — HEPARIN SODIUM (PORCINE) 1000 UNIT/ML IJ SOLN
INTRAMUSCULAR | Status: AC
  Filled 2019-04-11: qty 1

## 2019-04-11 MED ORDER — METHYLPREDNISOLONE SODIUM SUCC 125 MG IJ SOLR: 125.0000 mg | Freq: Once | INTRAMUSCULAR | Status: DC | PRN

## 2019-04-11 SURGICAL SUPPLY — 7 items
CANNULA 5F STIFF (CANNULA) ×2 IMPLANT
DRAPE BRACHIAL (DRAPES) ×2 IMPLANT
NEEDLE ENTRY 21GA 7CM ECHOTIP (NEEDLE) ×2 IMPLANT
PACK ANGIOGRAPHY (CUSTOM PROCEDURE TRAY) ×2 IMPLANT
SHEATH BRITE TIP 6FRX5.5 (SHEATH) ×2 IMPLANT
SUT MNCRL AB 4-0 PS2 18 (SUTURE) ×2 IMPLANT
TOWEL OR 17X26 4PK STRL BLUE (TOWEL DISPOSABLE) ×2 IMPLANT

## 2019-04-11 NOTE — H&P (Signed)
[facility] History & Physical Update  The patient was interviewed and re-examined.  The patient's previous History and Physical has been reviewed and is unchanged.  There is no change in the plan of care. We plan to proceed with the scheduled procedure.  Domnick Chervenak, MD  05/31/2018, 8:12 AM    

## 2019-04-11 NOTE — Op Note (Signed)
[facility]    OPERATIVE NOTE   PROCEDURE: 1.   Left radiocephalic arteriovenous fistula cannulation under ultrasound guidance 2.   Left arm fistulagram including central venogram   PRE-OPERATIVE DIAGNOSIS: 1. ESRD 2. Poorly functional left radiocephalic AVF  POST-OPERATIVE DIAGNOSIS: same as above   SURGEON: Leotis Pain, MD  ANESTHESIA: local with MCS  ESTIMATED BLOOD LOSS: 2 cc  FINDING(S): 1. Widely patent left radiocephalic AV fistula with no significant stenoses in the forearm.  In the upper arm, there was dual outflow through both the basilic vein and the cephalic vein with the basilic vein being a little larger.  This drained into the central venous circulation which was widely patent without significant stenosis.  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  SPECIMEN(S):  None  CONTRAST: 35 cc  FLUORO TIME: 1.4 minutes  MODERATE CONSCIOUS SEDATION TIME: Approximately 15 minutes with 4 mg of Versed and 150 mcg of Fentanyl   INDICATIONS: Nina Johnston is a 58 y.o. female who presents with malfunctioning left radiocephalic arteriovenous fistula with prolonged bleeding.  The patient is scheduled for left arm fistulagram.  The patient is aware the risks include but are not limited to: bleeding, infection, thrombosis of the cannulated access, and possible anaphylactic reaction to the contrast.  The patient is aware of the risks of the procedure and elects to proceed forward.  DESCRIPTION: After full informed written consent was obtained, the patient was brought back to the angiography suite and placed supine upon the angiography table.  The patient was connected to monitoring equipment. Moderate conscious sedation was administered with a face to face encounter with the patient throughout the procedure with my supervision of the RN administering medicines and  monitoring the patient's vital signs and mental status throughout from the start of the procedure until the patient was taken to the recovery room. The left arm was prepped and draped in the standard fashion for a percutaneous access intervention.  Under ultrasound guidance, the left radiocephalic arteriovenous fistula was cannulated with a micropuncture needle under direct ultrasound guidance where it was patent and a permanent image was performed.  The microwire was advanced into the fistula and the needle was exchanged for the a microsheath and imaging was performed.  Hand injections were completed to image the access including the central venous system and compression of the AV fistula was done to assess the arteriovenous anastomosis. This demonstrated widely patent left radiocephalic AV fistula with no significant stenoses in the forearm.  In the upper arm, there was dual outflow through both the basilic vein and the cephalic vein with the basilic vein being a little larger.  This drained into the central venous circulation which was widely patent without significant stenosis.  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  Based on the images, this patient will need no intervention. A 4-0 Monocryl purse-string suture was sewn around the sheath.  The sheath was removed while tying down the suture.  A sterile bandage was applied to the puncture site.  COMPLICATIONS: None  CONDITION: Stable   Leotis Pain  04/11/2019 8:41 AM   This note was created with Dragon Medical transcription system. Any errors in dictation are purely unintentional.

## 2019-04-16 ENCOUNTER — Other Ambulatory Visit: Payer: Self-pay | Admitting: Neurology

## 2019-04-18 ENCOUNTER — Other Ambulatory Visit: Payer: Self-pay | Admitting: Neurology

## 2019-04-18 ENCOUNTER — Telehealth: Payer: Self-pay | Admitting: Neurology

## 2019-04-18 MED ORDER — LAMICTAL 200 MG PO TABS: 300.0000 mg | ORAL_TABLET | Freq: Every day | ORAL | 1 refills | Status: DC

## 2019-04-18 NOTE — Telephone Encounter (Signed)
Refill sent for the patient to the pharmacy mentioned.  

## 2019-04-18 NOTE — Telephone Encounter (Signed)
Pt mother(on DPR-[place]ano,Marylee) has called for a refill on LAMICTAL 200 MG tablet [facility] #[id number]

## 2019-04-19 ENCOUNTER — Other Ambulatory Visit: Payer: Self-pay | Admitting: Neurology

## 2019-04-19 MED ORDER — LAMICTAL 200 MG PO TABS: 300.0000 mg | ORAL_TABLET | Freq: Two times a day (BID) | ORAL | 1 refills | Status: DC

## 2019-04-19 NOTE — Telephone Encounter (Signed)
Called the patient's mom and I have corrected the dosage and resent the refill request for the patient.

## 2019-04-19 NOTE — Telephone Encounter (Signed)
Called the patient mom back. She states that she has always been on the lamictal 200 mg brand name 1.5 tab twice a day. She was on that dose. I don't see anything about the dosage being changed in Dr Dohmeier's note in June although it was sent in for the change in June. I have corrected the dosage and will resend the refill as it is prescribed.

## 2019-04-19 NOTE — Telephone Encounter (Signed)
Patient mother called wanting to verify the qty/dosage the patient is suppose to be taking.   Please follow up

## 2019-05-02 ENCOUNTER — Encounter: Payer: Self-pay | Admitting: Family Medicine

## 2019-05-02 ENCOUNTER — Ambulatory Visit (INDEPENDENT_AMBULATORY_CARE_PROVIDER_SITE_OTHER): Payer: Medicare Other | Admitting: Family Medicine

## 2019-05-02 VITALS — BP 136/64 | HR 57 | Temp 98.2°F | Ht 62.0 in | Wt 206.0 lb

## 2019-05-02 DIAGNOSIS — R569 Unspecified convulsions: Secondary | ICD-10-CM | POA: Diagnosis not present

## 2019-05-02 DIAGNOSIS — G40209 Localization-related (focal) (partial) symptomatic epilepsy and epileptic syndromes with complex partial seizures, not intractable, without status epilepticus: Secondary | ICD-10-CM | POA: Diagnosis not present

## 2019-05-02 NOTE — Progress Notes (Signed)
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 107.6 (H) 01/29/2019   PLT 163 01/29/2019      Component Value Date/Time   NA 137 01/29/2019 1431   NA 142 03/08/2014 1634   NA 141 07/04/2011 0747   K 4.5 01/29/2019 1431   K 4.6 07/04/2011 0747   CL 97 (L) 01/29/2019 1431   CL 109 (H) 07/04/2011 0747   CO2 29 01/29/2019 1431   CO2 20 (L) 07/04/2011 0747   GLUCOSE 186 (H) 01/29/2019 1431   GLUCOSE 198 (H) 07/04/2011 0747   BUN 25 (H) 01/29/2019 1431   BUN 82 (HH) 03/08/2014 1634   BUN 65 (H) 07/04/2011 0747   CREATININE 3.70 (H) 01/29/2019 1431   CREATININE 1.95 (H) 07/04/2011 0747   CALCIUM 8.5 (L) 01/29/2019 1431   CALCIUM 8.6 07/04/2011 0747   PROT 6.6 01/29/2019 1431   PROT 6.8 03/08/2014 1634   PROT 7.3 07/04/2011 0747   ALBUMIN 3.6 01/29/2019 1431   ALBUMIN 4.4 03/08/2014 1634   ALBUMIN 3.8 07/04/2011 0747   AST 20 01/29/2019 1431   AST 11 (L) 07/04/2011 0747   ALT 17 01/29/2019 1431   ALT 14 07/04/2011 0747   ALKPHOS 175 (H) 01/29/2019 1431   ALKPHOS 188 (H) 07/04/2011 0747   BILITOT 0.8 01/29/2019 1431   BILITOT 0.2 07/04/2011 0747   GFRNONAA 13 (L) 01/29/2019 1431   GFRNONAA 29 (L) 07/04/2011 0747   GFRAA 15 (L) 01/29/2019 1431   GFRAA 35 (L) 07/04/2011 0747   No results found for: CHOL, HDL,  LDLCALC, LDLDIRECT, TRIG, CHOLHDL No results found for: HGBA1C No results found for: VITAMINB12 Lab Results  Component Value Date   TSH 2.373 04/10/2016    ASSESSMENT AND PLAN 58 y.o. year old female  has a past medical history of A-V fistula ([place]), Acute bacterial sinusitis (04/19/2014), Adaptive colitis, Anginal pain ([place]), Anxiety, Anxiety and depression, Arthritis, Asthma, Ataxia (11/10/2012), Benign essential HTN (12/26/2014), Bipolar disorder ([place]), Bronchitis, Carotid artery narrowing (12/22/2014), Cervical prolapse, CHF (congestive heart failure) ([place]), CKD (chronic kidney disease), Concussion (01-14-14), COPD (chronic obstructive pulmonary disease) ([place]), Coronary artery disease, Depression, Diabetes mellitus without complication ([place]), Dialysis patient ([place]), Diplopia, Dupuytren's contracture of foot, Essential (primary) hypertension (02/12/2015), Family history of thyroid problem, GERD (gastroesophageal reflux disease), Gout, Heart disease, Heart murmur, Hepatomegaly, HLD (hyperlipidemia), Hyperactive airway disease, Hyperkalemia, Hypertension, Hyperthyroidism, Irritable colon, Morbid obesity ([place]), Multiple thyroid nodules, OAB (overactive bladder), Pernicious anemia, Post menopausal syndrome, Post-concussion syndrome (03/08/2014), Post-traumatic brain syndrome, Prolapsed uterus, PSVT (  paroxysmal supraventricular tachycardia) ([place]), Renal calculus, Renal disease, Seizures ([place]), Sleep apnea, 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 here with     ICD-10-CM   1. Convulsions, unspecified convulsion type ([place])  R56.9   2. Partial symptomatic epilepsy with complex partial seizures, not intractable, without status epilepticus ([place])  G40.209     Dimples continues to have events, always following dialysis sessions, the persistence of right gaze,  unresponsiveness, repetitive movement of left hand with confusion for up to 20 to 30 minutes.  She continues Lamictal (brand medically necessary), levetiracetam,ethosuximide and clonazepam as prescribed.  Unfortunately, she was unable to have ambulatory EEG performed.  We will try to schedule that for her for evaluation of these events.  She will continue current treatment plan for now.  I will have her follow-up in 3 months for review of EEG and monitoring of these events.  She will continue to work closely with primary care, endocrinology and nephrology.  She verbalizes understanding and agreement with this plan.   No orders of the defined types were placed in this encounter.    No orders of the defined types were placed in this encounter.     Debbora Presto, FNP-C 05/02/2019, 12:34 PM [facility] [address], [place], [place] [address] [phone]

## 2019-05-02 NOTE — Patient Instructions (Signed)
Continue current treatment plan  We will set up the ambulatory EEG.   Follow up with Dr Brett Fairy in 3 months, sooner if needed   Seizure, Adult A seizure is a sudden burst of abnormal electrical activity in the brain. Seizures usually last from 30 seconds to 2 minutes. They can cause many different symptoms. Usually, seizures are not harmful unless they last a long time. What are the causes? Common causes of this condition include:  Fever or infection.  Conditions that affect the brain, such as: ? A brain abnormality that you were born with. ? A brain or head injury. ? Bleeding in the brain. ? A tumor. ? Stroke. ? Brain disorders such as autism or cerebral palsy.  Low blood sugar.  Conditions that are passed from parent to child (are inherited).  Problems with substances, such as: ? Having a reaction to a drug or a medicine. ? Suddenly stopping the use of a substance (withdrawal). In some cases, the cause may not be known. A person who has repeated seizures over time without a clear cause has a condition called epilepsy. What increases the risk? You are more likely to get this condition if you have:  A family history of epilepsy.  Had a seizure in the past.  A brain disorder.  A history of head injury, lack of oxygen at birth, or strokes. What are the signs or symptoms? There are many types of seizures. The symptoms vary depending on the type of seizure you have. Examples of symptoms during a seizure include:  Shaking (convulsions).  Stiffness in the body.  Passing out (losing consciousness).  Head nodding.  Staring.  Not responding to sound or touch.  Loss of bladder control and bowel control. Some people have symptoms right before and right after a seizure happens. Symptoms before a seizure may include:  Fear.  Worry (anxiety).  Feeling like you may vomit (nauseous).  Feeling like the room is spinning (vertigo).  Feeling like you saw or heard  something before (dj vu).  Odd tastes or smells.  Changes in how you see. You may see flashing lights or spots. Symptoms after a seizure happens can include:  Confusion.  Sleepiness.  Headache.  Weakness on one side of the body. How is this treated? Most seizures will stop on their own in under 5 minutes. In these cases, no treatment is needed. Seizures that last longer than 5 minutes will usually need treatment. Treatment can include:  Medicines given through an IV tube.  Avoiding things that are known to cause your seizures. These can include medicines that you take for another condition.  Medicines to treat epilepsy.  Surgery to stop the seizures. This may be needed if medicines do not help. Follow these instructions at home: Medicines  Take over-the-counter and prescription medicines only as told by your doctor.  Do not eat or drink anything that may keep your medicine from working, such as alcohol. Activity  Do not do any activities that would be dangerous if you had another seizure, like driving or swimming. Wait until your doctor says it is safe for you to do them.  If you live in the [place] ask your local DMV (department of motor vehicles) when you can drive.  Get plenty of rest. Teaching others Teach friends and family what to do when you have a seizure. They should:  Lay you on the ground.  Protect your head and body.  Loosen any tight clothing around your neck.  Turn  you on your side.  Not hold you down.  Not put anything into your mouth.  Know whether or not you need emergency care.  Stay with you until you are better.  General instructions  Contact your doctor each time you have a seizure.  Avoid anything that gives you seizures.  Keep a seizure diary. Write down: ? What you think caused each seizure. ? What you remember about each seizure.  Keep all follow-up visits as told by your doctor. This is important. Contact a doctor if:  You  have another seizure.  You have seizures more often.  There is any change in what happens during your seizures.  You keep having seizures with treatment.  You have symptoms of being sick or having an infection. Get help right away if:  You have a seizure that: ? Lasts longer than 5 minutes. ? Is different than seizures you had before. ? Makes it harder to breathe. ? Happens after you hurt your head.  You have any of these symptoms after a seizure: ? Not being able to speak. ? Not being able to use a part of your body. ? Confusion. ? A bad headache.  You have two or more seizures in a row.  You do not wake up right after a seizure.  You get hurt during a seizure. These symptoms may be an emergency. Do not wait to see if the symptoms will go away. Get medical help right away. Call your local emergency services (911 in the [place] Do not drive yourself to the hospital. Summary  Seizures usually last from 30 seconds to 2 minutes. Usually, they are not harmful unless they last a long time.  Do not eat or drink anything that may keep your medicine from working, such as alcohol.  Teach friends and family what to do when you have a seizure.  Contact your doctor each time you have a seizure. This information is not intended to replace advice given to you by your health care provider. Make sure you discuss any questions you have with your health care provider. Document Released: 10/08/2007 Document Revised: 07/09/2018 Document Reviewed: 07/09/2018 Elsevier Patient Education  [place].

## 2019-05-09 ENCOUNTER — Telehealth: Payer: Self-pay

## 2019-05-09 NOTE — Telephone Encounter (Signed)
Patient mother called to advise she would like a call back in regards to the patient getting a EEG done. States patient has a broken arm. Please follow up.   CB#[phone]

## 2019-05-10 NOTE — Telephone Encounter (Signed)
Hi Nina Johnston, She really needs an EEG ambulatory study.  Since the spells are so predictable , it should be fine to just use it for 24 hours , covering a day with hemo-dialyisis treatment.   Please tell her Merry Christmas and a Happy New Year- CD CD

## 2019-05-10 NOTE — Telephone Encounter (Signed)
Please relay message to patient. Dr Dohmeier would like to change to 24 hour EEG on a dialysis day for evaluation of events if patient is willing.

## 2019-05-10 NOTE — Telephone Encounter (Signed)
Good afternoon. Nina Johnston continues to have events following dialysis that includes unresponsiveness, persistent right gaze, repetitive movement of arm, confusion and intermittent combativeness. Event always follows dialysis and can last 20-30 minutes. I am uncertain if this is epileptic activity. You recommended ambulatory EEG in June but patient canceled due to fears of pandemic. She agreed to allow [place] to reschedule at last follow up in 04/2019. She called today to cancel again due to concerns of COVID and residual pain from a broken arm in 01/2019. Do you have any other recommendations? I did not make any changes in medication regimen.

## 2019-05-10 NOTE — Telephone Encounter (Signed)
I called mother of pt. Pt has had issues with her L arm (dislocated broken) repair since 01/2019.  AMB EEG was ordered via [place] [phone] and scheduled, but due to her arm healing, and COVID, they will cancel this for now and proceed later when things settle down for them.  They both appreciated your help in this.

## 2019-05-11 ENCOUNTER — Telehealth: Payer: Self-pay | Admitting: Neurology

## 2019-05-11 NOTE — Telephone Encounter (Signed)
I called pts mother to let her know of Dr. Allie Bossier reccs.  24 hour EEG at dialysis day.

## 2019-05-11 NOTE — Telephone Encounter (Signed)
Received a notification from [facility] that patient is scheduled for the EEG 05/18/2019

## 2019-05-12 NOTE — Telephone Encounter (Signed)
Noted, patient would need to contact [facility] to cancel the apt as well.

## 2019-05-12 NOTE — Telephone Encounter (Signed)
Pt's father Denyse Amass on [place] called stating that they would like to cancel this appt until further notice due to not wanting to risk being exposed to the [place]. Father states if any questions please call the mother's cell phone [phone]

## 2019-05-18 NOTE — Telephone Encounter (Signed)
I called and spoke to mother of pt.  I relayed per CD/MD message about doing 24hour EEG at dialysis one day.  She stated that pt and parents (who take care of her) stated that due to dialysis 3 days week, PT now twice week due to her dislocation and covid risk, they are deferring this to a later time.  Misunderstanding of who would call to cancel the testing initally scheduled.  She cancelled it yesterday when they called to confirm.  She appreciates Dr. Brett Fairy and Amy NP and hopefully they understand reasoning for deferrement.

## 2019-06-17 ENCOUNTER — Encounter (INDEPENDENT_AMBULATORY_CARE_PROVIDER_SITE_OTHER): Payer: Medicare Other

## 2019-06-17 ENCOUNTER — Ambulatory Visit (INDEPENDENT_AMBULATORY_CARE_PROVIDER_SITE_OTHER): Payer: Medicare Other | Admitting: Vascular Surgery

## 2019-06-25 ENCOUNTER — Other Ambulatory Visit: Payer: Self-pay | Admitting: Neurology

## 2019-06-25 ENCOUNTER — Telehealth: Payer: Self-pay | Admitting: Neurology

## 2019-06-25 DIAGNOSIS — R270 Ataxia, unspecified: Secondary | ICD-10-CM

## 2019-06-25 DIAGNOSIS — R4789 Other speech disturbances: Secondary | ICD-10-CM

## 2019-06-25 DIAGNOSIS — R404 Transient alteration of awareness: Secondary | ICD-10-CM

## 2019-06-25 NOTE — Telephone Encounter (Signed)
A prescription for clonazepam was called in.

## 2019-06-25 NOTE — Telephone Encounter (Signed)
The patient called, she needed a refill on her clonazepam, a prescription was sent in.

## 2019-07-28 ENCOUNTER — Other Ambulatory Visit (INDEPENDENT_AMBULATORY_CARE_PROVIDER_SITE_OTHER): Payer: Self-pay | Admitting: Vascular Surgery

## 2019-07-28 DIAGNOSIS — T82590D Other mechanical complication of surgically created arteriovenous fistula, subsequent encounter: Secondary | ICD-10-CM

## 2019-07-29 ENCOUNTER — Other Ambulatory Visit: Payer: Self-pay

## 2019-07-29 ENCOUNTER — Ambulatory Visit (INDEPENDENT_AMBULATORY_CARE_PROVIDER_SITE_OTHER): Payer: Medicare Other

## 2019-07-29 ENCOUNTER — Encounter (INDEPENDENT_AMBULATORY_CARE_PROVIDER_SITE_OTHER): Payer: Self-pay | Admitting: Vascular Surgery

## 2019-07-29 ENCOUNTER — Ambulatory Visit (INDEPENDENT_AMBULATORY_CARE_PROVIDER_SITE_OTHER): Payer: Medicare Other | Admitting: Vascular Surgery

## 2019-07-29 VITALS — BP 143/55 | HR 64 | Resp 16 | Wt 202.0 lb

## 2019-07-29 DIAGNOSIS — T82590D Other mechanical complication of surgically created arteriovenous fistula, subsequent encounter: Secondary | ICD-10-CM

## 2019-07-29 DIAGNOSIS — E1122 Type 2 diabetes mellitus with diabetic chronic kidney disease: Secondary | ICD-10-CM | POA: Diagnosis not present

## 2019-07-29 DIAGNOSIS — N186 End stage renal disease: Secondary | ICD-10-CM

## 2019-07-29 DIAGNOSIS — Z992 Dependence on renal dialysis: Secondary | ICD-10-CM

## 2019-07-29 DIAGNOSIS — I10 Essential (primary) hypertension: Secondary | ICD-10-CM

## 2019-07-29 NOTE — Assessment & Plan Note (Signed)
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  At this point, no role for intervention.  Would continue to rotate her access sites to avoid aneurysmal degeneration.  Return in 6 months with noninvasive studies.

## 2019-07-29 NOTE — Progress Notes (Signed)
MRN : [id number]  Nina Johnston is a 59 y.o. (22-Oct-1960) female who presents with chief complaint of  Chief Complaint  Patient presents with  . Follow-up    ultrasound follow up  .  History of Present Illness: Patient returns today in follow up of her dialysis access.  It had some issues with lower flow rates and access it sounds like.  All in all, her fistula has been working reasonably well for her dialysis.  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  Current Outpatient Medications  Medication Sig Dispense Refill  . aspirin 81 MG tablet Take 81 mg by mouth daily.     Marland Kitchen b complex vitamins tablet Take 1 tablet by mouth at bedtime.     . B Complex-C-Folic Acid (DIALYVITE TABLET) TABS Take 1 tablet by mouth daily.  12  . benzonatate (TESSALON) 200 MG capsule Take by mouth.    . clonazePAM (KLONOPIN) 1 MG tablet TAKE 1-2 TABLETS (1-2 MG TOTAL) BY MOUTH DAILY. 180 tablet 1  . cloNIDine (CATAPRES) 0.1 MG tablet TAKE 1 TABLET (0.1 MG TOTAL) BY MOUTH 2 (TWO) TIMES DAILY    . cyanocobalamin (,VITAMIN B-12,) 1000 MCG/ML injection Inject 1,000 mcg into the muscle every 14 (fourteen) days.     Marland Kitchen epoetin alfa (EPOGEN,PROCRIT) 3000 UNIT/ML injection Inject 3,000 Units into the vein. Every tues, thur and sat with dialysis    . ethosuximide (ZARONTIN) 250 MG/5ML solution 5 ml bid 473 mL 3  . Febuxostat 80 MG TABS Take 80 mg by mouth daily.     . fexofenadine (ALLEGRA) 180 MG tablet Take 180 mg by mouth daily.     . Fluticasone-Salmeterol (ADVAIR DISKUS) 250-50 MCG/DOSE AEPB Inhale 1 puff into the lungs 2 (two) times daily.    . furosemide (LASIX) 20 MG tablet Take 30 mg by mouth 2 (two) times daily.     . hydrALAZINE (APRESOLINE) 100 MG tablet Take 50 mg by mouth See admin instructions. Take 100 mg by mouth twice daily on non-dialysis days. Take 100 mg by mouth at bedtime on dialysis days Tuesday, Thursday and Saturday    .  HYDROcodone-acetaminophen (NORCO/VICODIN) 5-325 MG tablet Take 1 tablet by mouth every 6 (six) hours as needed.    . insulin glargine (LANTUS) 100 UNIT/ML injection Inject 22 Units into the skin at bedtime.     . insulin lispro (HUMALOG) 100 UNIT/ML injection Inject 10-15 Units into the skin 3 (three) times daily with meals. Per sliding scale    . LAMICTAL 200 MG tablet Take 1.5 tablets (300 mg total) by mouth 2 (two) times daily. 270 tablet 1  . levETIRAcetam (KEPPRA) 250 MG tablet Take Bid on non -dialysis days, 3 a day on hemodialysis days. 270 tablet 5  . levothyroxine (SYNTHROID) 150 MCG tablet Take 150 mcg by mouth daily before breakfast.     . metoprolol succinate (TOPROL XL) 50 MG 24 hr tablet Take 1 tablet (50 mg total) by mouth 2 (two) times daily. Take with or immediately following a meal. (Patient taking differently: Take 75 mg by mouth See admin instructions. Take 75 mg by mouth twice daily on non dialysis days. Take 75 mg by mouth at bedtime on dialysis days Tuesday, Thursday and Saturday.) 60 tablet 11  . montelukast (SINGULAIR) 10 MG tablet Take 10 mg by mouth at bedtime.    Marland Kitchen nystatin-triamcinolone (MYCOLOG II) cream Apply 1 application topically 2 (two) times daily. (Patient taking  differently: Apply 1 application topically daily as needed (rash). ) 30 g 1  . pantoprazole (PROTONIX) 40 MG tablet Take 40 mg by mouth every morning.     . Probiotic Product (ALIGN) 4 MG CAPS Take 4 mg by mouth daily.     Marland Kitchen triamcinolone (NASACORT ALLERGY 24HR) 55 MCG/ACT AERO nasal inhaler Place 2 sprays into the nose at bedtime.     Marland Kitchen UNIFINE PENTIPS 32G X 4 MM MISC USE UP TO 5 TIMES DAILY AS DIRECTED  3  . amLODipine (NORVASC) 5 MG tablet Take 5 mg by mouth See admin instructions. Take 5 mg by mouth daily except do not take on Tuesday, Thursday and Saturday.     No current facility-administered medications for this visit.    Past Medical History:  Diagnosis Date  . A-V fistula ([place])   . Acute  bacterial sinusitis 04/19/2014  . Adaptive colitis   . Anginal pain ([place])   . Anxiety   . Anxiety and depression   . Arthritis   . Asthma   . 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 Benign essential HTN 12/26/2014  . Bipolar disorder ([place])   . Bronchitis   . Carotid artery narrowing 12/22/2014  . Cervical prolapse   . CHF (congestive heart failure) ([place])   . CKD (chronic kidney disease)   . Concussion 01-14-14   Fall   . COPD (chronic obstructive pulmonary disease) ([place])   . Coronary artery disease   . Depression   . Diabetes mellitus without complication ([place])   . Dialysis patient ([place])   . Diplopia   . Dupuytren's contracture of foot   . Essential (primary) hypertension 02/12/2015  . Family history of thyroid problem   . GERD (gastroesophageal reflux disease)   . Gout   . Heart disease    enlarged because of COPD  . Heart murmur   . Hepatomegaly   . HLD (hyperlipidemia)   . Hyperactive airway disease   . Hyperkalemia   . Hypertension   . Hyperthyroidism   . Irritable colon   . Morbid obesity ([place])   . Multiple thyroid nodules   . OAB (overactive bladder)   . Pernicious anemia   . Post menopausal syndrome   . Post-concussion syndrome 03/08/2014  . Post-traumatic brain syndrome   . Prolapsed uterus   . PSVT (paroxysmal supraventricular tachycardia) ([place])   . Renal calculus   . Renal disease    w/ GFR 29-may be due to diabetes  . Seizures ([place])    last seizure 2012. has had spells since with no knowledge. last was 1 month ago  . Sleep apnea   . 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 Transient alteration of awareness 11/10/2012  . Urinary incontinence with continuous leakage 03/08/2014    Past Surgical History:  Procedure Laterality Date  . A/V FISTULAGRAM Left 07/21/2016   Procedure: A/V Fistulagram;  Surgeon: Algernon Huxley, MD;  Location: [facility];  Service: Cardiovascular;  Laterality: Left;  . A/V FISTULAGRAM Left 12/24/2016   Procedure: A/V Fistulagram;  Surgeon: Algernon Huxley, MD;  Location: [facility];  Service: Cardiovascular;  Laterality: Left;  . A/V FISTULAGRAM Left 06/29/2017   Procedure: A/V FISTULAGRAM;  Surgeon: Algernon Huxley, MD;  Location: [facility];  Service: Cardiovascular;  Laterality: Left;  . A/V  FISTULAGRAM Left 03/08/2018   Procedure: A/V FISTULAGRAM;  Surgeon: Algernon Huxley, MD;  Location: [facility];  Service: Cardiovascular;  Laterality: Left;  . A/V FISTULAGRAM Left 04/11/2019   Procedure: A/V FISTULAGRAM;  Surgeon: Algernon Huxley, MD;  Location: [facility];  Service: Cardiovascular;  Laterality: Left;  . A/V SHUNT INTERVENTION N/A 07/21/2016   Procedure: A/V Shunt Intervention;  Surgeon: Algernon Huxley, MD;  Location: [facility];  Service: Cardiovascular;  Laterality: N/A;  . A/V SHUNT INTERVENTION N/A 12/24/2016   Procedure: A/V SHUNT INTERVENTION;  Surgeon: Algernon Huxley, MD;  Location: [facility];  Service: Cardiovascular;  Laterality: N/A;  . AVF    . CHOLECYSTECTOMY    . COLONOSCOPY    . COLONOSCOPY WITH PROPOFOL N/A 03/18/2017   Procedure: COLONOSCOPY WITH PROPOFOL;  Surgeon: Manya Silvas, MD;  Location: [facility] ENDOSCOPY;  Service: Endoscopy;  Laterality: N/A;  . ENDOMETRIAL BIOPSY     ablation, uterine  . ESOPHAGOGASTRODUODENOSCOPY (EGD) WITH PROPOFOL N/A 03/18/2017   Procedure: ESOPHAGOGASTRODUODENOSCOPY (EGD) WITH PROPOFOL;  Surgeon: Manya Silvas, MD;  Location[facility] ENDOSCOPY;  Service: Endoscopy;  Laterality: N/A;  . HERNIA REPAIR    . HERNIA REPAIR    . PERIPHERAL VASCULAR CATHETERIZATION N/A 08/23/2015   Procedure: Dialysis/Perma Catheter Insertion;  Surgeon: Algernon Huxley, MD;  Location: [facility];  Service: Cardiovascular;  Laterality: N/A;  . PERIPHERAL VASCULAR CATHETERIZATION N/A 11/09/2015   Procedure: Dialysis/Perma  Catheter Removal;  Surgeon: Katha Cabal, MD;  Location: [facility];  Service: Cardiovascular;  Laterality: N/A;  . PERIPHERAL VASCULAR CATHETERIZATION Left 04/23/2016   Procedure: A/V Fistulagram;  Surgeon: Algernon Huxley, MD;  Location: [facility];  Service: Cardiovascular;  Laterality: Left;  . PORT A CATH REVISION       Social History   Tobacco Use  . Smoking status: Never Smoker  . Smokeless tobacco: Never Used  Substance Use Topics  . Alcohol use: No  . Drug use: No    Family History  Problem Relation Age of Onset  . Diabetes Mother   . Lumbar disc disease Mother   . Migraines Mother   . Hypertension Mother   . Cancer - Prostate Father        mild  . Diabetes Father   . Hypertension Father   . Breast cancer Neg Hx   . Ovarian cancer Neg Hx   . Colon cancer Neg Hx   . Heart disease Neg Hx   . Kidney cancer Neg Hx   . Bladder Cancer Neg Hx      Allergies  Allergen Reactions  . Cinnamon Other (See Comments)    Raises blood pressure  . Procrit [Epoetin Alfa-Epbx] Other (See Comments)    Seizures, grand mal  . Ace Inhibitors Cough  . Azithromycin Other (See Comments)    seizures  . Iron Other (See Comments)    Can not take in IV Form - causes seizures   . Neomycin-Bacitracin Zn-Polymyx Other (See Comments)    Unknown  . Sulfa Antibiotics Other (See Comments)    Due to risks for seizures.     REVIEW OF SYSTEMS (Negative unless checked)  Constitutional: [] Weight loss  [] Fever  [] Chills Cardiac: [] Chest pain   [] Chest pressure   [] Palpitations   [] Shortness of breath when laying flat   [] Shortness of breath at rest   [] Shortness of breath with exertion. Vascular:  [] Pain in legs with  walking   [] Pain in legs at rest   [] Pain in legs when laying flat   [] Claudication   [] Pain in feet when walking  [] Pain in feet at rest  [] Pain in feet when laying flat   [] History of DVT   [] Phlebitis   [x] Swelling in legs   [] Varicose veins   [] Non-healing  ulcers Pulmonary:   [] Uses home oxygen   [] Productive cough   [] Hemoptysis   [] Wheeze  [] COPD   [] Asthma Neurologic:  [] Dizziness  [] Blackouts   [] Seizures   [] History of stroke   [] History of TIA  [] Aphasia   [] Temporary blindness   [] Dysphagia   [] Weakness or numbness in arms   [] Weakness or numbness in legs Musculoskeletal:  [] Arthritis   [] Joint swelling   [] Joint pain   [] Low back pain Hematologic:  [] Easy bruising  [x] Easy bleeding   [] Hypercoagulable state   [] Anemic   Gastrointestinal:  [] Blood in stool   [] Vomiting blood  [] Gastroesophageal reflux/heartburn   [] Abdominal pain Genitourinary:  [x] Chronic kidney disease   [] Difficult urination  [] Frequent urination  [] Burning with urination   [] Hematuria Skin:  [] Rashes   [] Ulcers   [] Wounds Psychological:  [] History of anxiety   []  History of major depression.  Physical Examination  BP (!) 143/55 (BP Location: Right Arm)   Pulse 64   Resp 16   Wt 202 lb (91.6 kg)   BMI 36.95 kg/m  Gen:  WD/WN, NAD Head: [place]/AT, No temporalis wasting. Ear/Nose/Throat: Hearing grossly intact, nares w/o erythema or drainage Eyes: Conjunctiva clear. Sclera non-icteric Neck: Supple.  Trachea midline Pulmonary:  Good air movement, no use of accessory muscles.  Cardiac: RRR, no JVD Vascular: Good thrill in left forearm AV fistula Vessel Right Left  Radial Palpable Palpable               Musculoskeletal: M/S 5/5 throughout.  No deformity or atrophy.  Mild lower extremity edema. Neurologic: Sensation grossly intact in extremities.  Symmetrical.  Speech is fluent.  Psychiatric: Judgment intact, Mood & affect appropriate for pt's clinical situation. Dermatologic: No rashes or ulcers noted.  No cellulitis or open wounds.       Labs No results found for this or any previous visit (from the past 2160 hour(s)).  Radiology No results found.  Assessment/Plan Type 2 diabetes mellitus (HCC) blood glucose control important in reducing the  progression of atherosclerotic disease. Also, involved in wound healing. On appropriate medications.   Essential (primary) hypertension blood pressure control important in reducing the progression of atherosclerotic disease. On appropriate oral medications.  ESRD on dialysis [facility]) Duplex today shows a patent left radiocephalic AV fistula without focal stenosis or issues.  At this point, no role for intervention.  Would continue to rotate her access sites to avoid aneurysmal degeneration.  Return in 6 months with noninvasive studies.    Leotis Pain, MD  07/29/2019 10:21 AM    This note was created with Dragon medical transcription system.  Any errors from dictation are purely unintentional

## 2019-08-01 ENCOUNTER — Ambulatory Visit: Payer: Medicare Other | Admitting: Neurology

## 2019-08-02 ENCOUNTER — Other Ambulatory Visit: Payer: Self-pay | Admitting: Radiology

## 2019-08-02 DIAGNOSIS — N200 Calculus of kidney: Secondary | ICD-10-CM

## 2019-08-10 ENCOUNTER — Ambulatory Visit: Payer: Medicare Other | Admitting: Urology

## 2019-08-15 ENCOUNTER — Other Ambulatory Visit: Payer: Self-pay | Admitting: Physician Assistant

## 2019-08-15 ENCOUNTER — Encounter: Payer: Self-pay | Admitting: Neurology

## 2019-08-15 ENCOUNTER — Other Ambulatory Visit: Payer: Self-pay

## 2019-08-15 ENCOUNTER — Ambulatory Visit (INDEPENDENT_AMBULATORY_CARE_PROVIDER_SITE_OTHER): Payer: Medicare Other | Admitting: Neurology

## 2019-08-15 DIAGNOSIS — R270 Ataxia, unspecified: Secondary | ICD-10-CM | POA: Diagnosis not present

## 2019-08-15 DIAGNOSIS — R404 Transient alteration of awareness: Secondary | ICD-10-CM

## 2019-08-15 DIAGNOSIS — Z992 Dependence on renal dialysis: Secondary | ICD-10-CM

## 2019-08-15 DIAGNOSIS — R2231 Localized swelling, mass and lump, right upper limb: Secondary | ICD-10-CM

## 2019-08-15 DIAGNOSIS — R4789 Other speech disturbances: Secondary | ICD-10-CM

## 2019-08-15 DIAGNOSIS — R590 Localized enlarged lymph nodes: Secondary | ICD-10-CM

## 2019-08-15 DIAGNOSIS — T8619 Other complication of kidney transplant: Secondary | ICD-10-CM

## 2019-08-15 MED ORDER — LAMICTAL 200 MG PO TABS: 300.0000 mg | ORAL_TABLET | Freq: Two times a day (BID) | ORAL | 1 refills | Status: DC

## 2019-08-15 MED ORDER — ETHOSUXIMIDE 250 MG/5ML PO SOLN: ORAL | 3 refills | Status: DC

## 2019-08-15 MED ORDER — LEVETIRACETAM 250 MG PO TABS: ORAL_TABLET | ORAL | 5 refills | Status: DC

## 2019-08-15 MED ORDER — CLONAZEPAM 1 MG PO TABS: 1.0000 mg | ORAL_TABLET | Freq: Every day | ORAL | 1 refills | Status: DC

## 2019-08-15 NOTE — Patient Instructions (Signed)
Seizure, Adult °A seizure is a sudden burst of abnormal electrical activity in the brain. Seizures usually last from 30 seconds to 2 minutes. They can cause many different symptoms. °Usually, seizures are not harmful unless they last a long time. °What are the causes? °Common causes of this condition include: °· Fever or infection. °· Conditions that affect the brain, such as: °? A brain abnormality that you were born with. °? A brain or head injury. °? Bleeding in the brain. °? A tumor. °? Stroke. °? Brain disorders such as autism or cerebral palsy. °· Low blood sugar. °· Conditions that are passed from parent to child (are inherited). °· Problems with substances, such as: °? Having a reaction to a drug or a medicine. °? Suddenly stopping the use of a substance (withdrawal). °In some cases, the cause may not be known. A person who has repeated seizures over time without a clear cause has a condition called epilepsy. °What increases the risk? °You are more likely to get this condition if you have: °· A family history of epilepsy. °· Had a seizure in the past. °· A brain disorder. °· A history of head injury, lack of oxygen at birth, or strokes. °What are the signs or symptoms? °There are many types of seizures. The symptoms vary depending on the type of seizure you have. Examples of symptoms during a seizure include: °· Shaking (convulsions). °· Stiffness in the body. °· Passing out (losing consciousness). °· Head nodding. °· Staring. °· Not responding to sound or touch. °· Loss of bladder control and bowel control. °Some people have symptoms right before and right after a seizure happens. °Symptoms before a seizure may include: °· Fear. °· Worry (anxiety). °· Feeling like you may vomit (nauseous). °· Feeling like the room is spinning (vertigo). °· Feeling like you saw or heard something before (déjà vu). °· Odd tastes or smells. °· Changes in how you see. You may see flashing lights or spots. °Symptoms after a  seizure happens can include: °· Confusion. °· Sleepiness. °· Headache. °· Weakness on one side of the body. °How is this treated? °Most seizures will stop on their own in under 5 minutes. In these cases, no treatment is needed. Seizures that last longer than 5 minutes will usually need treatment. Treatment can include: °· Medicines given through an IV tube. °· Avoiding things that are known to cause your seizures. These can include medicines that you take for another condition. °· Medicines to treat epilepsy. °· Surgery to stop the seizures. This may be needed if medicines do not help. °Follow these instructions at home: °Medicines °· Take over-the-counter and prescription medicines only as told by your doctor. °· Do not eat or drink anything that may keep your medicine from working, such as alcohol. °Activity °· Do not do any activities that would be dangerous if you had another seizure, like driving or swimming. Wait until your doctor says it is safe for you to do them. °· If you live in the [place] ask your local DMV (department of motor vehicles) when you can drive. °· Get plenty of rest. °Teaching others °Teach friends and family what to do when you have a seizure. They should: °· Lay you on the ground. °· Protect your head and body. °· Loosen any tight clothing around your neck. °· Turn you on your side. °· Not hold you down. °· Not put anything into your mouth. °· Know whether or not you need emergency care. °· Stay   with you until you are better. ° °General instructions °· Contact your doctor each time you have a seizure. °· Avoid anything that gives you seizures. °· Keep a seizure diary. Write down: °? What you think caused each seizure. °? What you remember about each seizure. °· Keep all follow-up visits as told by your doctor. This is important. °Contact a doctor if: °· You have another seizure. °· You have seizures more often. °· There is any change in what happens during your seizures. °· You keep having  seizures with treatment. °· You have symptoms of being sick or having an infection. °Get help right away if: °· You have a seizure that: °? Lasts longer than 5 minutes. °? Is different than seizures you had before. °? Makes it harder to breathe. °? Happens after you hurt your head. °· You have any of these symptoms after a seizure: °? Not being able to speak. °? Not being able to use a part of your body. °? Confusion. °? A bad headache. °· You have two or more seizures in a row. °· You do not wake up right after a seizure. °· You get hurt during a seizure. °These symptoms may be an emergency. Do not wait to see if the symptoms will go away. Get medical help right away. Call your local emergency services (911 in the [place] Do not drive yourself to the hospital. °Summary °· Seizures usually last from 30 seconds to 2 minutes. Usually, they are not harmful unless they last a long time. °· Do not eat or drink anything that may keep your medicine from working, such as alcohol. °· Teach friends and family what to do when you have a seizure. °· Contact your doctor each time you have a seizure. °This information is not intended to replace advice given to you by your health care provider. Make sure you discuss any questions you have with your health care provider. °Document Revised: 07/09/2018 Document Reviewed: 07/09/2018 °Elsevier Patient Education © 2020 [employer] ° °
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
## 2019-08-24 ENCOUNTER — Ambulatory Visit
Admission: RE | Admit: 2019-08-24 | Discharge: 2019-08-24 | Disposition: A | Discharge status: Home / Self Care | Payer: Medicare Other | Source: Ambulatory Visit | Attending: Urology | Admitting: Urology

## 2019-08-24 ENCOUNTER — Encounter: Payer: Self-pay | Admitting: Urology

## 2019-08-24 ENCOUNTER — Ambulatory Visit (INDEPENDENT_AMBULATORY_CARE_PROVIDER_SITE_OTHER): Payer: Medicare Other | Admitting: Urology

## 2019-08-24 ENCOUNTER — Ambulatory Visit
Admission: RE | Admit: 2019-08-24 | Discharge: 2019-08-24 | Disposition: A | Discharge status: Home / Self Care | Payer: Medicare Other | Source: Ambulatory Visit | Attending: Physician Assistant | Admitting: Physician Assistant

## 2019-08-24 ENCOUNTER — Other Ambulatory Visit: Payer: Self-pay

## 2019-08-24 VITALS — BP 123/62 | HR 70 | Ht 62.0 in | Wt 202.0 lb

## 2019-08-24 DIAGNOSIS — R590 Localized enlarged lymph nodes: Secondary | ICD-10-CM | POA: Diagnosis present

## 2019-08-24 DIAGNOSIS — N186 End stage renal disease: Secondary | ICD-10-CM

## 2019-08-24 DIAGNOSIS — Z992 Dependence on renal dialysis: Secondary | ICD-10-CM

## 2019-08-24 DIAGNOSIS — N200 Calculus of kidney: Secondary | ICD-10-CM | POA: Diagnosis not present

## 2019-08-24 DIAGNOSIS — R2231 Localized swelling, mass and lump, right upper limb: Secondary | ICD-10-CM | POA: Insufficient documentation

## 2019-08-24 DIAGNOSIS — R9389 Abnormal findings on diagnostic imaging of other specified body structures: Secondary | ICD-10-CM

## 2019-08-29 NOTE — Progress Notes (Signed)
08/24/2019 4:36 PM   Nina Johnston [birth year] [id number]  Referring provider: Idelle Crouch, MD [place] [facility] [place],  [place] [address]  Chief Complaint  Patient presents with  . Follow-up    HPI: Patient is a 59 year old female with ESRD on dialysis with nephrolithiasis who presents today for an overdue follow up.    Contrast CT in 01/2018 demonstrated multiple bilateral renal stones which were layering in a dependent nature favoring a milk of calcium appearance vs true stone burden.   Non contrast CT in 01/2019 again demonstrated multiple bilateral renal stones which continue to have a layering effect and low HU measurements favoring the milk of calcium vs true stone burden.  There were also findings of increased attenuation in the left breast with skin thickening for which a mammogram was recommended by radiology.  She has not had these imaging as of this appointment.   Also, there was the notation of a nodule in the left ovary which was increased in size and an ultrasound was recommended for further evaluation by radiology.    KUB  08/24/2019 Multiple bilateral renal stones.      PMH: Past Medical History:  Diagnosis Date  . A-V fistula ([place])   . Acute bacterial sinusitis 04/19/2014  . Adaptive colitis   . Anginal pain ([place])   . Anxiety   . Anxiety and depression   . Arthritis   . Asthma   . 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 Benign essential HTN 12/26/2014  . Bipolar disorder ([place])   . Bronchitis   . Carotid artery narrowing 12/22/2014  . Cervical prolapse   . CHF (congestive heart failure) ([place])   . CKD (chronic kidney disease)   . Concussion 01-14-14   Fall   . COPD (chronic obstructive pulmonary disease) ([place])   . Coronary artery disease   . Depression   . Diabetes mellitus without complication ([place])   . Dialysis patient ([place])   . Diplopia   .  Dupuytren's contracture of foot   . Essential (primary) hypertension 02/12/2015  . Family history of thyroid problem   . GERD (gastroesophageal reflux disease)   . Gout   . Heart disease    enlarged because of COPD  . Heart murmur   . Hepatomegaly   . HLD (hyperlipidemia)   . Hyperactive airway disease   . Hyperkalemia   . Hypertension   . Hyperthyroidism   . Irritable colon   . Morbid obesity ([place])   . Multiple thyroid nodules   . OAB (overactive bladder)   . Pernicious anemia   . Post menopausal syndrome   . Post-concussion syndrome 03/08/2014  . Post-traumatic brain syndrome   . Prolapsed uterus   . PSVT (paroxysmal supraventricular tachycardia) ([place])   . Renal calculus   . Renal disease    w/ GFR 29-may be due to diabetes  . Seizures ([place])    last seizure 2012. has had spells since with no knowledge. last was 1 month ago  . Sleep apnea   . 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 Transient alteration of awareness 11/10/2012  . Urinary incontinence with continuous leakage 03/08/2014    Surgical History: Past Surgical History:  Procedure Laterality Date  . A/V FISTULAGRAM Left 07/21/2016   Procedure: A/V Fistulagram;  Surgeon: Algernon Huxley, MD;  Location: [facility];  Service: Cardiovascular;  Laterality: Left;  . A/V FISTULAGRAM Left 12/24/2016   Procedure: A/V Fistulagram;  Surgeon: Algernon Huxley, MD;  Location: [facility];  Service: Cardiovascular;  Laterality: Left;  . A/V FISTULAGRAM Left 06/29/2017   Procedure: A/V FISTULAGRAM;  Surgeon: Algernon Huxley, MD;  Location: [facility];  Service: Cardiovascular;  Laterality: Left;  . A/V FISTULAGRAM Left 03/08/2018   Procedure: A/V FISTULAGRAM;  Surgeon: Algernon Huxley, MD;  Location: [facility];  Service: Cardiovascular;  Laterality: Left;  . A/V FISTULAGRAM Left 04/11/2019   Procedure: A/V FISTULAGRAM;  Surgeon: Algernon Huxley, MD;  Location: [facility];  Service: Cardiovascular;  Laterality: Left;  . A/V SHUNT INTERVENTION N/A 07/21/2016   Procedure: A/V Shunt Intervention;  Surgeon: Algernon Huxley, MD;  Location: [facility];  Service: Cardiovascular;  Laterality: N/A;  . A/V SHUNT INTERVENTION N/A 12/24/2016   Procedure: A/V SHUNT INTERVENTION;  Surgeon: Algernon Huxley, MD;  Location: [facility];  Service: Cardiovascular;  Laterality: N/A;  . AVF    . CHOLECYSTECTOMY    . COLONOSCOPY    . COLONOSCOPY WITH PROPOFOL N/A 03/18/2017   Procedure: COLONOSCOPY WITH PROPOFOL;  Surgeon: Manya Silvas, MD;  Location: [facility];  Service: Endoscopy;  Laterality: N/A;  . ENDOMETRIAL BIOPSY     ablation, uterine  . ESOPHAGOGASTRODUODENOSCOPY (EGD) WITH PROPOFOL N/A 03/18/2017   Procedure: ESOPHAGOGASTRODUODENOSCOPY (EGD) WITH PROPOFOL;  Surgeon: Manya Silvas, MD;  Location: [facility] ENDOSCOPY;  Service: Endoscopy;  Laterality: N/A;  . HERNIA REPAIR    . HERNIA REPAIR    . PERIPHERAL VASCULAR CATHETERIZATION N/A 08/23/2015   Procedure: Dialysis/Perma Catheter Insertion;  Surgeon: Algernon Huxley, MD;  Location: [facility];  Service: Cardiovascular;  Laterality: N/A;  . PERIPHERAL VASCULAR CATHETERIZATION N/A 11/09/2015   Procedure: Dialysis/Perma Catheter Removal;  Surgeon: Katha Cabal, MD;  Location: [facility];  Service: Cardiovascular;  Laterality: N/A;  . PERIPHERAL VASCULAR CATHETERIZATION Left 04/23/2016   Procedure: A/V Fistulagram;  Surgeon: Algernon Huxley, MD;  Location: [facility];  Service: Cardiovascular;  Laterality: Left;  . PORT A CATH REVISION      Home Medications:  Allergies as of 08/24/2019      Reactions   Cinnamon Other (See Comments)   Raises blood pressure   Procrit [epoetin Alfa-epbx] Other (See Comments)   Seizures, grand mal   Ace Inhibitors Cough   Azithromycin Other (See Comments)   seizures   Iron Other (See Comments)   Can not take in IV Form -  causes seizures    Neomycin-bacitracin Zn-polymyx Other (See Comments)   Unknown   Sulfa Antibiotics Other (See Comments)   Due to risks for seizures.      Medication List       Accurate as of August 24, 2019 11:59 PM. If you have any questions, ask your nurse or doctor.        Advair Diskus 250-50 MCG/DOSE Aepb Generic drug: Fluticasone-Salmeterol Inhale 1 puff into the lungs 2 (two) times daily.   Align 4 MG Caps Take 4 mg by mouth daily.   Allegra 180 MG tablet Generic drug: fexofenadine Take 180 mg by mouth daily.   amLODipine 5 MG tablet Commonly known as: NORVASC Take 5 mg by mouth See admin instructions. Take 5 mg by mouth daily except do not take on Tuesday, Thursday and Saturday.  aspirin 81 MG tablet Take 81 mg by mouth daily.   b complex vitamins tablet Take 1 tablet by mouth at bedtime.   benzonatate 200 MG capsule Commonly known as: TESSALON Take by mouth.   clonazePAM 1 MG tablet Commonly known as: KLONOPIN Take 1-2 tablets (1-2 mg total) by mouth daily.   cloNIDine 0.1 MG tablet Commonly known as: CATAPRES TAKE 1 TABLET (0.1 MG TOTAL) BY MOUTH 2 (TWO) TIMES DAILY   cyanocobalamin 1000 MCG/ML injection Commonly known as: (VITAMIN B-12) Inject 1,000 mcg into the muscle every 14 (fourteen) days.   DIALYVITE TABLET Tabs Take 1 tablet by mouth daily.   epoetin alfa 3000 UNIT/ML injection Commonly known as: EPOGEN Inject 3,000 Units into the vein. Every tues, thur and sat with dialysis   ethosuximide 250 MG/5ML solution Commonly known as: ZARONTIN 5 ml bid   Febuxostat 80 MG Tabs Take 80 mg by mouth daily.   furosemide 20 MG tablet Commonly known as: LASIX Take 30 mg by mouth 2 (two) times daily.   hydrALAZINE 100 MG tablet Commonly known as: APRESOLINE Take 50 mg by mouth See admin instructions. Take 100 mg by mouth twice daily on non-dialysis days. Take 100 mg by mouth at bedtime on dialysis days Tuesday, Thursday and Saturday     HYDROcodone-acetaminophen 5-325 MG tablet Commonly known as: NORCO/VICODIN Take 1 tablet by mouth every 6 (six) hours as needed.   insulin lispro 100 UNIT/ML injection Commonly known as: HUMALOG Inject 10-15 Units into the skin 3 (three) times daily with meals. Per sliding scale   LaMICtal 200 MG tablet Generic drug: lamoTRIgine Take 1.5 tablets (300 mg total) by mouth 2 (two) times daily.   Lantus 100 UNIT/ML injection Generic drug: insulin glargine Inject 22 Units into the skin at bedtime.   levETIRAcetam 250 MG tablet Commonly known as: Keppra Take Bid on non -dialysis days, 3 a day on hemodialysis days.   metoprolol succinate 50 MG 24 hr tablet Commonly known as: Toprol XL Take 1 tablet (50 mg total) by mouth 2 (two) times daily. Take with or immediately following a meal. What changed:   how much to take  when to take this  additional instructions   Nasacort Allergy 24HR 55 MCG/ACT Aero nasal inhaler Generic drug: triamcinolone Place 2 sprays into the nose at bedtime.   nystatin-triamcinolone cream Commonly known as: MYCOLOG II Apply 1 application topically 2 (two) times daily. What changed:   when to take this  reasons to take this   pantoprazole 40 MG tablet Commonly known as: PROTONIX Take 40 mg by mouth every morning.   Singulair 10 MG tablet Generic drug: montelukast Take 10 mg by mouth at bedtime.   Synthroid 150 MCG tablet Generic drug: levothyroxine Take 150 mcg by mouth daily before breakfast.   Unifine Pentips 32G X 4 MM Misc Generic drug: Insulin Pen Needle USE UP TO 5 TIMES DAILY AS DIRECTED       Allergies:  Allergies  Allergen Reactions  . Cinnamon Other (See Comments)    Raises blood pressure  . Procrit [Epoetin Alfa-Epbx] Other (See Comments)    Seizures, grand mal  . Ace Inhibitors Cough  . Azithromycin Other (See Comments)    seizures  . Iron Other (See Comments)    Can not take in IV Form - causes seizures   .  Neomycin-Bacitracin Zn-Polymyx Other (See Comments)    Unknown  . Sulfa Antibiotics Other (See Comments)    Due to risks for seizures.  Family History: Family History  Problem Relation Age of Onset  . Diabetes Mother   . Lumbar disc disease Mother   . Migraines Mother   . Hypertension Mother   . Cancer - Prostate Father        mild  . Diabetes Father   . Hypertension Father   . Breast cancer Neg Hx   . Ovarian cancer Neg Hx   . Colon cancer Neg Hx   . Heart disease Neg Hx   . Kidney cancer Neg Hx   . Bladder Cancer Neg Hx     Social History:  reports that she has never smoked. She has never used smokeless tobacco. She reports that she does not drink alcohol or use drugs.  ROS: For pertinent review of systems please refer to history of present illness  Physical Exam: BP 123/62   Pulse 70   Ht 5\' 2"  (1.575 m)   Wt 202 lb (91.6 kg)   BMI 36.95 kg/m   Constitutional:  Well nourished. Alert and oriented, No acute distress. HEENT: [place] AT, mask in place.  Trachea midline. Cardiovascular: No clubbing, cyanosis, or edema. Respiratory: Normal respiratory effort, no increased work of breathing. Neurologic: Grossly intact, no focal deficits, moving all 4 extremities. Psychiatric: Normal mood and affect.   Laboratory Data: Lab Results  Component Value Date   WBC 10.3 01/29/2019   HGB 8.6 (L) 01/29/2019   HCT 26.8 (L) 01/29/2019   MCV 107.6 (H) 01/29/2019   PLT 163 01/29/2019    Lab Results  Component Value Date   CREATININE 3.70 (H) 01/29/2019    Lab Results  Component Value Date   TSH 2.373 04/10/2016    Lab Results  Component Value Date   AST 20 01/29/2019   Lab Results  Component Value Date   ALT 17 01/29/2019   I have reviewed the labs.   Pertinent Imaging: CLINICAL DATA:  Bilateral nephrolithiasis.  EXAM: ABDOMEN - 1 VIEW  COMPARISON:  CT 01/29/2019  FINDINGS: Multiple stones project over the bilateral kidneys. Right kidney is low  positioning in the abdomen and unchanged from prior CT. There may be an element of underlying nephrocalcinosis. No evidence of ureteral calculi. No stones project over the pelvis in the region of the bladder. There are vascular calcifications. Three trace center rounded density projecting over the left sacrum correspond to abdominal wall musculature on CT. Cholecystectomy clips in the right upper quadrant. No evidence of bowel obstruction. Scattered external artifact. No acute osseous abnormalities are seen  IMPRESSION: Multiple bilateral renal stones. Difficult to exclude an element of underlying nephrocalcinosis on radiograph.   Electronically Signed   By: Keith Rake M.D.   On: 08/24/2019 20:20 I have independently reviewed the films  Assessment & Plan:    1.  Bilateral nephrolithiasis Seen on KUB.  She has not had issues with infection, hematuria or pain.   Will continue to manage conservatively RTC in one year for KUB  2. ESRD Due to DM and HTN Currently on dialysis   3. Abnormal breast findings on CT Patient is given a copy of her CT from 01/2019 to take to her father who is a physician  4. Nodule in left ovary Patient is given a copy of her CT from 01/2019 to take to her father who is a physician   Return in about 1 year (around 08/23/2020) for KUB and office visit .  These notes generated with voice recognition software. I apologize for typographical errors.  Guy Toney  [place], [facility] [address]  [place], [place] [address] [phone]

## 2019-08-30 ENCOUNTER — Other Ambulatory Visit: Payer: Self-pay | Admitting: Internal Medicine

## 2019-08-30 DIAGNOSIS — N83209 Unspecified ovarian cyst, unspecified side: Secondary | ICD-10-CM

## 2019-08-31 ENCOUNTER — Other Ambulatory Visit: Payer: Self-pay | Admitting: Internal Medicine

## 2019-08-31 DIAGNOSIS — Z1231 Encounter for screening mammogram for malignant neoplasm of breast: Secondary | ICD-10-CM

## 2019-09-02 ENCOUNTER — Other Ambulatory Visit: Payer: Self-pay | Admitting: Orthopedic Surgery

## 2019-09-02 ENCOUNTER — Other Ambulatory Visit: Payer: Self-pay | Admitting: Internal Medicine

## 2019-09-02 DIAGNOSIS — N838 Other noninflammatory disorders of ovary, fallopian tube and broad ligament: Secondary | ICD-10-CM

## 2019-09-09 ENCOUNTER — Ambulatory Visit
Admission: RE | Admit: 2019-09-09 | Discharge: 2019-09-09 | Disposition: A | Discharge status: Home / Self Care | Payer: Medicare Other | Source: Ambulatory Visit | Attending: Internal Medicine | Admitting: Internal Medicine

## 2019-09-09 ENCOUNTER — Other Ambulatory Visit: Payer: Self-pay

## 2019-09-09 DIAGNOSIS — N838 Other noninflammatory disorders of ovary, fallopian tube and broad ligament: Secondary | ICD-10-CM | POA: Insufficient documentation

## 2019-09-26 ENCOUNTER — Ambulatory Visit
Admission: RE | Admit: 2019-09-26 | Discharge: 2019-09-26 | Disposition: A | Discharge status: Home / Self Care | Payer: Medicare Other | Source: Ambulatory Visit | Attending: Internal Medicine | Admitting: Internal Medicine

## 2019-09-26 DIAGNOSIS — Z1231 Encounter for screening mammogram for malignant neoplasm of breast: Secondary | ICD-10-CM | POA: Insufficient documentation

## 2019-10-26 DIAGNOSIS — D631 Anemia in chronic kidney disease: Secondary | ICD-10-CM | POA: Insufficient documentation

## 2019-10-26 DIAGNOSIS — N185 Chronic kidney disease, stage 5: Secondary | ICD-10-CM | POA: Insufficient documentation

## 2020-01-10 ENCOUNTER — Other Ambulatory Visit: Payer: Self-pay | Admitting: Neurology

## 2020-01-11 ENCOUNTER — Telehealth: Payer: Self-pay | Admitting: Neurology

## 2020-01-11 ENCOUNTER — Telehealth: Payer: Self-pay | Admitting: *Deleted

## 2020-01-11 NOTE — Telephone Encounter (Signed)
Called pharmacy to obtain pharmacy benefit information.  BIN: [id number] PCN: [place] GROUP: [id number] ID: [id number]  I completed PA request on cover my meds for BRAND NAME LAMICTAL TABLET. Key: [id number]. Patient has tried and failed generic lamotrigine in the past as it caused a grand mal seizure. Also has tried Depakote, Lithium, Carbamazepine, Zonisamide. Currently on brand name lamictal, levetiracetam, clonazepam, and ethosuximide. Awaiting [employer] [place] Medicare Part D determination within 3 days.

## 2020-01-11 NOTE — Telephone Encounter (Signed)
[employer] ([place]) called to notify Dr. Brett Fairy; LAMICTAL 200 MG tablet has been approved for 1 year until 01/10/2021.

## 2020-01-27 ENCOUNTER — Other Ambulatory Visit: Payer: Self-pay

## 2020-01-27 ENCOUNTER — Ambulatory Visit (INDEPENDENT_AMBULATORY_CARE_PROVIDER_SITE_OTHER): Payer: Medicare Other | Admitting: Vascular Surgery

## 2020-01-27 ENCOUNTER — Encounter (INDEPENDENT_AMBULATORY_CARE_PROVIDER_SITE_OTHER): Payer: Self-pay | Admitting: Vascular Surgery

## 2020-01-27 VITALS — BP 116/57 | HR 69 | Resp 16 | Wt 202.4 lb

## 2020-01-27 DIAGNOSIS — Z992 Dependence on renal dialysis: Secondary | ICD-10-CM

## 2020-01-27 DIAGNOSIS — E1122 Type 2 diabetes mellitus with diabetic chronic kidney disease: Secondary | ICD-10-CM | POA: Diagnosis not present

## 2020-01-27 DIAGNOSIS — N186 End stage renal disease: Secondary | ICD-10-CM | POA: Diagnosis not present

## 2020-01-27 DIAGNOSIS — I10 Essential (primary) hypertension: Secondary | ICD-10-CM

## 2020-01-27 NOTE — Progress Notes (Signed)
MRN : [id number]  Nina Johnston is a 59 y.o. (30-Nov-1960) female who presents with chief complaint of  Chief Complaint  Patient presents with  . Follow-up    50month follow up  .  History of Present Illness: Patient returns today in follow up of her dialysis access.  She is using a left radiocephalic AV fistula which is working well for dialysis.  At this point, it is mildly aneurysmal but it has not gotten worse since her last visit 6 months ago.  She does have some mild tingling in her fingers with dialysis and has had an occasional infiltration.  Arm is not swollen.  She does not have pain outside of dialysis  Current Outpatient Medications  Medication Sig Dispense Refill  . aspirin 81 MG tablet Take 81 mg by mouth daily.     Marland Kitchen b complex vitamins tablet Take 1 tablet by mouth at bedtime.     . B Complex-C-Folic Acid (DIALYVITE TABLET) TABS Take 1 tablet by mouth daily.  12  . benzonatate (TESSALON) 200 MG capsule Take by mouth.    . clonazePAM (KLONOPIN) 1 MG tablet Take 1-2 tablets (1-2 mg total) by mouth daily. 180 tablet 1  . cloNIDine (CATAPRES) 0.1 MG tablet TAKE 1 TABLET (0.1 MG TOTAL) BY MOUTH 2 (TWO) TIMES DAILY    . cyanocobalamin (,VITAMIN B-12,) 1000 MCG/ML injection Inject 1,000 mcg into the muscle every 14 (fourteen) days.     Marland Kitchen epoetin alfa (EPOGEN,PROCRIT) 3000 UNIT/ML injection Inject 3,000 Units into the vein. Every tues, thur and sat with dialysis    . ethosuximide (ZARONTIN) 250 MG/5ML solution 5 ml bid 473 mL 3  . Febuxostat 80 MG TABS Take 80 mg by mouth daily.     . fexofenadine (ALLEGRA) 180 MG tablet Take 180 mg by mouth daily.     . Fluticasone-Salmeterol (ADVAIR DISKUS) 250-50 MCG/DOSE AEPB Inhale 1 puff into the lungs 2 (two) times daily.    . furosemide (LASIX) 20 MG tablet Take 30 mg by mouth 2 (two) times daily.     . hydrALAZINE (APRESOLINE) 100 MG tablet Take 50 mg by mouth See admin instructions. Take 100 mg by mouth twice daily on non-dialysis  days. Take 100 mg by mouth at bedtime on dialysis days Tuesday, Thursday and Saturday    . insulin glargine (LANTUS) 100 UNIT/ML injection Inject 22 Units into the skin at bedtime.     . insulin lispro (HUMALOG) 100 UNIT/ML injection Inject 10-15 Units into the skin 3 (three) times daily with meals. Per sliding scale    . LAMICTAL 200 MG tablet Take 1.5 tablets (300 mg total) by mouth 2 (two) times daily. 270 tablet 1  . levETIRAcetam (KEPPRA) 250 MG tablet Take Bid on non -dialysis days, 3 a day on hemodialysis days. 270 tablet 5  . levothyroxine (SYNTHROID) 150 MCG tablet Take 150 mcg by mouth daily before breakfast.     . lidocaine-prilocaine (EMLA) cream Apply topically as directed.    . metoprolol succinate (TOPROL XL) 50 MG 24 hr tablet Take 1 tablet (50 mg total) by mouth 2 (two) times daily. Take with or immediately following a meal. (Patient taking differently: Take 75 mg by mouth See admin instructions. Take 75 mg by mouth twice daily on non dialysis days. Take 75 mg by mouth at bedtime on dialysis days Tuesday, Thursday and Saturday.) 60 tablet 11  . montelukast (SINGULAIR) 10 MG tablet Take 10 mg by mouth at bedtime.    Marland Kitchen  nystatin-triamcinolone (MYCOLOG II) cream Apply 1 application topically 2 (two) times daily. (Patient taking differently: Apply 1 application topically daily as needed (rash). ) 30 g 1  . pantoprazole (PROTONIX) 40 MG tablet Take 40 mg by mouth every morning.     . Probiotic Product (ALIGN) 4 MG CAPS Take 4 mg by mouth daily.     Marland Kitchen triamcinolone (NASACORT ALLERGY 24HR) 55 MCG/ACT AERO nasal inhaler Place 2 sprays into the nose at bedtime.     Marland Kitchen UNIFINE PENTIPS 32G X 4 MM MISC USE UP TO 5 TIMES DAILY AS DIRECTED  3  . amLODipine (NORVASC) 5 MG tablet Take 5 mg by mouth See admin instructions. Take 5 mg by mouth daily except do not take on Tuesday, Thursday and Saturday.    Marland Kitchen HYDROcodone-acetaminophen (NORCO/VICODIN) 5-325 MG tablet Take 1 tablet by mouth every 6 (six)  hours as needed. (Patient not taking: Reported on 01/27/2020)     No current facility-administered medications for this visit.    Past Medical History:  Diagnosis Date  . A-V fistula ([place])   . Acute bacterial sinusitis 04/19/2014  . Adaptive colitis   . Anginal pain ([place])   . Anxiety   . Anxiety and depression   . Arthritis   . Asthma   . 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 Benign essential HTN 12/26/2014  . Bipolar disorder ([place])   . Bronchitis   . Carotid artery narrowing 12/22/2014  . Cervical prolapse   . CHF (congestive heart failure) ([place])   . CKD (chronic kidney disease)   . Concussion 01-14-14   Fall   . COPD (chronic obstructive pulmonary disease) ([place])   . Coronary artery disease   . Depression   . Diabetes mellitus without complication ([place])   . Dialysis patient ([place])   . Diplopia   . Dupuytren's contracture of foot   . Essential (primary) hypertension 02/12/2015  . Family history of thyroid problem   . GERD (gastroesophageal reflux disease)   . Gout   . Heart disease    enlarged because of COPD  . Heart murmur   . Hepatomegaly   . HLD (hyperlipidemia)   . Hyperactive airway disease   . Hyperkalemia   . Hypertension   . Hyperthyroidism   . Irritable colon   . Morbid obesity ([place])   . Multiple thyroid nodules   . OAB (overactive bladder)   . Pernicious anemia   . Post menopausal syndrome   . Post-concussion syndrome 03/08/2014  . Post-traumatic brain syndrome   . Prolapsed uterus   . PSVT (paroxysmal supraventricular tachycardia) ([place])   . Renal calculus   . Renal disease    w/ GFR 29-may be due to diabetes  . Seizures ([place])    last seizure 2012. has had spells since with no knowledge. last was 1 month ago  . Sleep apnea   . 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 Transient alteration of awareness 11/10/2012  . Urinary incontinence  with continuous leakage 03/08/2014    Past Surgical History:  Procedure Laterality Date  . A/V FISTULAGRAM Left 07/21/2016   Procedure: A/V Fistulagram;  Surgeon: Algernon Huxley, MD;  Location: [facility];  Service: Cardiovascular;  Laterality: Left;  . A/V FISTULAGRAM Left 12/24/2016   Procedure: A/V Fistulagram;  Surgeon: Algernon Huxley, MD;  Location: [place]  [facility];  Service: Cardiovascular;  Laterality: Left;  . A/V FISTULAGRAM Left 06/29/2017   Procedure: A/V FISTULAGRAM;  Surgeon: Algernon Huxley, MD;  Location: [place] [facility];  Service: Cardiovascular;  Laterality: Left;  . A/V FISTULAGRAM Left 03/08/2018   Procedure: A/V FISTULAGRAM;  Surgeon: Algernon Huxley, MD;  Location: [facility];  Service: Cardiovascular;  Laterality: Left;  . A/V FISTULAGRAM Left 04/11/2019   Procedure: A/V FISTULAGRAM;  Surgeon: Algernon Huxley, MD;  Location: [place];  Service: Cardiovascular;  Laterality: Left;  . A/V SHUNT INTERVENTION N/A 07/21/2016   Procedure: A/V Shunt Intervention;  Surgeon: Algernon Huxley, MD;  Location: [place] [facility];  Service: Cardiovascular;  Laterality: N/A;  . A/V SHUNT INTERVENTION N/A 12/24/2016   Procedure: A/V SHUNT INTERVENTION;  Surgeon: Algernon Huxley, MD;  Location: [facility];  Service: Cardiovascular;  Laterality: N/A;  . AVF    . CHOLECYSTECTOMY    . COLONOSCOPY    . COLONOSCOPY WITH PROPOFOL N/A 03/18/2017   Procedure: COLONOSCOPY WITH PROPOFOL;  Surgeon: Manya Silvas, MD;  Location: [facility] ENDOSCOPY;  Service: Endoscopy;  Laterality: N/A;  . ENDOMETRIAL BIOPSY     ablation, uterine  . ESOPHAGOGASTRODUODENOSCOPY (EGD) WITH PROPOFOL N/A 03/18/2017   Procedure: ESOPHAGOGASTRODUODENOSCOPY (EGD) WITH PROPOFOL;  Surgeon: Manya Silvas, MD;  Location: [facility] ENDOSCOPY;  Service: Endoscopy;  Laterality: N/A;  . HERNIA REPAIR    . HERNIA REPAIR    . PERIPHERAL VASCULAR CATHETERIZATION N/A 08/23/2015   Procedure: Dialysis/Perma  Catheter Insertion;  Surgeon: Algernon Huxley, MD;  Location: [place] [facility];  Service: Cardiovascular;  Laterality: N/A;  . PERIPHERAL VASCULAR CATHETERIZATION N/A 11/09/2015   Procedure: Dialysis/Perma Catheter Removal;  Surgeon: Katha Cabal, MD;  Location: [place] [facility];  Service: Cardiovascular;  Laterality: N/A;  . PERIPHERAL VASCULAR CATHETERIZATION Left 04/23/2016   Procedure: A/V Fistulagram;  Surgeon: Algernon Huxley, MD;  Location: [place] [facility];  Service: Cardiovascular;  Laterality: Left;  . PORT A CATH REVISION       Social History   Tobacco Use  . Smoking status: Never Smoker  . Smokeless tobacco: Never Used  Vaping Use  . Vaping Use: Never used  Substance Use Topics  . Alcohol use: No  . Drug use: No     Family History  Problem Relation Age of Onset  . Diabetes Mother   . Lumbar disc disease Mother   . Migraines Mother   . Hypertension Mother   . Cancer - Prostate Father        mild  . Diabetes Father   . Hypertension Father   . Breast cancer Neg Hx   . Ovarian cancer Neg Hx   . Colon cancer Neg Hx   . Heart disease Neg Hx   . Kidney cancer Neg Hx   . Bladder Cancer Neg Hx     Allergies  Allergen Reactions  . Cinnamon Other (See Comments)    Raises blood pressure  . Procrit [Epoetin Alfa-Epbx] Other (See Comments)    Seizures, grand mal  . Ace Inhibitors Cough  . Azithromycin Other (See Comments)    seizures  . Iron Other (See Comments)    Can not take in IV Form - causes seizures   . Neomycin-Bacitracin Zn-Polymyx Other (See Comments)    Unknown  . Other   . Sulfa Antibiotics Other (See Comments)    Due to risks for seizures.  REVIEW OF SYSTEMS (Negative unless checked)  Constitutional: [] ?Weight loss  [] ?Fever  [] ?Chills Cardiac: [] ?Chest pain   [] ?Chest pressure   [] ?Palpitations   [] ?Shortness of breath when laying flat   [] ?Shortness of breath at rest   [] ?Shortness of breath with exertion. Vascular:  [] ?Pain in  legs with walking   [] ?Pain in legs at rest   [] ?Pain in legs when laying flat   [] ?Claudication   [] ?Pain in feet when walking  [] ?Pain in feet at rest  [] ?Pain in feet when laying flat   [] ?History of DVT   [] ?Phlebitis   [x] ?Swelling in legs   [] ?Varicose veins   [] ?Non-healing ulcers Pulmonary:   [] ?Uses home oxygen   [] ?Productive cough   [] ?Hemoptysis   [] ?Wheeze  [] ?COPD   [] ?Asthma Neurologic:  [] ?Dizziness  [] ?Blackouts   [] ?Seizures   [] ?History of stroke   [] ?History of TIA  [] ?Aphasia   [] ?Temporary blindness   [] ?Dysphagia   [] ?Weakness or numbness in arms   [] ?Weakness or numbness in legs Musculoskeletal:  [] ?Arthritis   [] ?Joint swelling   [] ?Joint pain   [] ?Low back pain Hematologic:  [] ?Easy bruising  [x] ?Easy bleeding   [] ?Hypercoagulable state   [] ?Anemic   Gastrointestinal:  [] ?Blood in stool   [] ?Vomiting blood  [] ?Gastroesophageal reflux/heartburn   [] ?Abdominal pain Genitourinary:  [x] ?Chronic kidney disease   [] ?Difficult urination  [] ?Frequent urination  [] ?Burning with urination   [] ?Hematuria Skin:  [] ?Rashes   [] ?Ulcers   [] ?Wounds Psychological:  [] ?History of anxiety   [] ? History of major depression.  Physical Examination  BP (!) 116/57 (BP Location: Right Arm)   Pulse 69   Resp 16   Wt 202 lb 6.4 oz (91.8 kg)   BMI 37.02 kg/m  Gen:  WD/WN, NAD Head: [place]/AT, No temporalis wasting. Ear/Nose/Throat: Hearing grossly intact, nares w/o erythema or drainage Eyes: Conjunctiva clear. Sclera non-icteric Neck: Supple.  Trachea midline Pulmonary:  Good air movement, no use of accessory muscles.  Cardiac: RRR, no JVD Vascular: Mildly aneurysmal left radiocephalic AV fistula with excellent thrill Vessel Right Left  Radial Palpable Palpable               Musculoskeletal: M/S 5/5 throughout.  No deformity or atrophy.  Trace lower extremity edema. Neurologic: Sensation grossly intact in extremities.  Symmetrical.  Speech is fluent.  Psychiatric: Judgment intact, Mood &  affect appropriate for pt's clinical situation. Dermatologic: No rashes or ulcers noted.  No cellulitis or open wounds.       Labs No results found for this or any previous visit (from the past 2160 hour(s)).  Radiology No results found.  Assessment/Plan Type 2 diabetes mellitus (HCC) blood glucose control important in reducing the progression of atherosclerotic disease. Also, involved in wound healing. On appropriate medications.   Essential (primary) hypertension blood pressure control important in reducing the progression of atherosclerotic disease. On appropriate oral medications.  ESRD on dialysis [facility]) Her access is working well and they seem to be rotating their access sites quite well.  At this point, I would continue to use the access.  Her steal symptoms are fairly mild at this point.  I would plan to see her back in 6 months and we will get a duplex on her next visit.    Leotis Pain, MD  01/27/2020 1:55 PM    This note was created with Dragon medical transcription system.  Any errors from dictation are purely unintentional

## 2020-01-27 NOTE — Assessment & Plan Note (Signed)
Her access is working well and they seem to be rotating their access sites quite well.  At this point, I would continue to use the access.  Her steal symptoms are fairly mild at this point.  I would plan to see her back in 6 months and we will get a duplex on her next visit.

## 2020-03-06 ENCOUNTER — Other Ambulatory Visit: Payer: Self-pay | Admitting: Neurology

## 2020-03-12 ENCOUNTER — Ambulatory Visit: Payer: Medicare Other | Attending: Specialist

## 2020-03-12 DIAGNOSIS — G4761 Periodic limb movement disorder: Secondary | ICD-10-CM | POA: Insufficient documentation

## 2020-03-12 DIAGNOSIS — G4733 Obstructive sleep apnea (adult) (pediatric): Secondary | ICD-10-CM | POA: Insufficient documentation

## 2020-03-13 ENCOUNTER — Other Ambulatory Visit: Payer: Self-pay

## 2020-03-14 ENCOUNTER — Telehealth: Payer: Self-pay | Admitting: Neurology

## 2020-03-14 ENCOUNTER — Other Ambulatory Visit: Payer: Self-pay | Admitting: Neurology

## 2020-03-14 DIAGNOSIS — T8619 Other complication of kidney transplant: Secondary | ICD-10-CM

## 2020-03-14 DIAGNOSIS — R4789 Other speech disturbances: Secondary | ICD-10-CM

## 2020-03-14 DIAGNOSIS — R270 Ataxia, unspecified: Secondary | ICD-10-CM

## 2020-03-14 DIAGNOSIS — R404 Transient alteration of awareness: Secondary | ICD-10-CM

## 2020-03-14 MED ORDER — ETHOSUXIMIDE 250 MG/5ML PO SOLN: ORAL | 0 refills | Status: DC

## 2020-03-14 NOTE — Telephone Encounter (Signed)
Pt.'s mom Theodis Sato is on DPR. requesting refill for ethosuximide (ZARONTIN) 250 MG/5ML solution. Mom states she may run out before Mon. appt.  Pharmacy: [id number]

## 2020-03-14 NOTE — Telephone Encounter (Signed)
Refill sent to the pharmacy for the patient for 1 mth supply. Patient has upcoming apt on Monday will send further refills after visit

## 2020-03-19 ENCOUNTER — Encounter: Payer: Self-pay | Admitting: Neurology

## 2020-03-19 ENCOUNTER — Ambulatory Visit (INDEPENDENT_AMBULATORY_CARE_PROVIDER_SITE_OTHER): Payer: Medicare Other | Admitting: Neurology

## 2020-03-19 DIAGNOSIS — R270 Ataxia, unspecified: Secondary | ICD-10-CM

## 2020-03-19 DIAGNOSIS — Z992 Dependence on renal dialysis: Secondary | ICD-10-CM | POA: Diagnosis not present

## 2020-03-19 DIAGNOSIS — T8619 Other complication of kidney transplant: Secondary | ICD-10-CM

## 2020-03-19 DIAGNOSIS — R4789 Other speech disturbances: Secondary | ICD-10-CM | POA: Diagnosis not present

## 2020-03-19 DIAGNOSIS — R404 Transient alteration of awareness: Secondary | ICD-10-CM | POA: Diagnosis not present

## 2020-03-19 MED ORDER — ETHOSUXIMIDE 250 MG/5ML PO SOLN: ORAL | 3 refills | Status: DC

## 2020-03-19 MED ORDER — LEVETIRACETAM 250 MG PO TABS: ORAL_TABLET | ORAL | 5 refills | Status: DC

## 2020-03-19 MED ORDER — CLONAZEPAM 1 MG PO TABS: 1.0000 mg | ORAL_TABLET | Freq: Every day | ORAL | 1 refills | Status: DC

## 2020-03-19 NOTE — Progress Notes (Signed)
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 recently- staring, speech arrest, automatism,   Right hand shaking. Left hand fisted, She seems to turn away to the left- away from her mother when approached.  All seizure seemed related to dialysis or on dilaysis days.  Lasting 10-25 minutes.  Very tired and sleepy afterwards.   Plan :  Continue with current medications, I will  Refill today : Keppra , brand name, Ethosuximide/ refilled. Klonopin refilled.     Larey Seat, MD   08-15-2019.

## 2020-04-16 ENCOUNTER — Ambulatory Visit: Payer: Medicare Other | Attending: Specialist

## 2020-04-16 DIAGNOSIS — G4733 Obstructive sleep apnea (adult) (pediatric): Secondary | ICD-10-CM | POA: Insufficient documentation

## 2020-04-16 DIAGNOSIS — G4761 Periodic limb movement disorder: Secondary | ICD-10-CM | POA: Insufficient documentation

## 2020-04-17 ENCOUNTER — Other Ambulatory Visit: Payer: Self-pay

## 2020-04-23 ENCOUNTER — Telehealth: Payer: Self-pay | Admitting: Neurology

## 2020-04-23 ENCOUNTER — Other Ambulatory Visit: Payer: Self-pay | Admitting: Neurology

## 2020-04-23 DIAGNOSIS — T8619 Other complication of kidney transplant: Secondary | ICD-10-CM

## 2020-04-23 DIAGNOSIS — R4789 Other speech disturbances: Secondary | ICD-10-CM

## 2020-04-23 DIAGNOSIS — R270 Ataxia, unspecified: Secondary | ICD-10-CM

## 2020-04-23 DIAGNOSIS — R404 Transient alteration of awareness: Secondary | ICD-10-CM

## 2020-04-23 NOTE — Telephone Encounter (Signed)
Pt's mother called wanting a refill on her ethosuximide (ZARONTIN) 250 MG/5ML solution sent in to the [facility] [address]

## 2020-04-23 NOTE — Telephone Encounter (Signed)
[facility] called [facility]. They had an old ethosuiximide RX but they found the 03/19/20 RX. They will refill this medication.  I called pt, spoke to pt's father, per [facility] and advised him of this. [facility] will [facility]. Pt's father verbalized understanding.

## 2020-05-16 ENCOUNTER — Other Ambulatory Visit: Payer: Self-pay | Admitting: Internal Medicine

## 2020-05-16 DIAGNOSIS — G4452 New daily persistent headache (NDPH): Secondary | ICD-10-CM

## 2020-05-23 ENCOUNTER — Ambulatory Visit
Admission: RE | Admit: 2020-05-23 | Discharge: 2020-05-23 | Disposition: A | Discharge status: Home / Self Care | Payer: Medicare Other | Source: Ambulatory Visit | Attending: Internal Medicine | Admitting: Internal Medicine

## 2020-05-23 ENCOUNTER — Other Ambulatory Visit: Payer: Self-pay

## 2020-05-23 DIAGNOSIS — G4452 New daily persistent headache (NDPH): Secondary | ICD-10-CM | POA: Diagnosis not present

## 2020-06-18 ENCOUNTER — Telehealth: Payer: Self-pay | Admitting: Neurology

## 2020-06-18 NOTE — Telephone Encounter (Signed)
Pt's mother called today and said that she is out of her medication Klonopin and needs a refill.

## 2020-06-18 NOTE — Telephone Encounter (Signed)
Prescription was sent to the pharmacy in november for a 3 month supply with a refill. Pt should be able to get refill from the pharmacy.  [place] drug registry was verified and shows there is 1 refill remaining to be filled.

## 2020-06-19 NOTE — Telephone Encounter (Signed)
I contacted the pharmacy and left a voicemail on the prescriber line informing the pharmacy the patient is in need of the refill of her medicine.  Advised them to use the refill remaining from her November prescription.  Advised the pharmacy to reach back out if they need anything from [place].

## 2020-06-26 ENCOUNTER — Other Ambulatory Visit (INDEPENDENT_AMBULATORY_CARE_PROVIDER_SITE_OTHER): Payer: Self-pay | Admitting: *Deleted

## 2020-07-20 ENCOUNTER — Other Ambulatory Visit: Payer: Self-pay

## 2020-07-20 ENCOUNTER — Encounter (INDEPENDENT_AMBULATORY_CARE_PROVIDER_SITE_OTHER): Payer: Self-pay | Admitting: Vascular Surgery

## 2020-07-20 ENCOUNTER — Ambulatory Visit (INDEPENDENT_AMBULATORY_CARE_PROVIDER_SITE_OTHER): Payer: Medicare Other | Admitting: Vascular Surgery

## 2020-07-20 ENCOUNTER — Ambulatory Visit (INDEPENDENT_AMBULATORY_CARE_PROVIDER_SITE_OTHER): Payer: Medicare Other

## 2020-07-20 VITALS — BP 139/60 | HR 57 | Resp 16 | Wt 202.6 lb

## 2020-07-20 DIAGNOSIS — N186 End stage renal disease: Secondary | ICD-10-CM | POA: Diagnosis not present

## 2020-07-20 DIAGNOSIS — Z992 Dependence on renal dialysis: Secondary | ICD-10-CM

## 2020-07-20 DIAGNOSIS — E1122 Type 2 diabetes mellitus with diabetic chronic kidney disease: Secondary | ICD-10-CM

## 2020-07-20 DIAGNOSIS — I10 Essential (primary) hypertension: Secondary | ICD-10-CM | POA: Diagnosis not present

## 2020-07-20 NOTE — Assessment & Plan Note (Signed)
Duplex today shows a widely patent left radiocephalic AV fistula without focal stenosis.  This continues to work well.  They are doing a fairly good job of rotating the access sites which will prolong the durability of this fistula dramatically.  There is slight aneurysmal degeneration at the access sites, but no bleeding or reason for revision at this time.  Continue to rotate access sites.  Recheck in 6 months with duplex.

## 2020-07-20 NOTE — Progress Notes (Signed)
MRN : [id number]  Nina Johnston is a 60 y.o. (10/23/1960) female who presents with chief complaint of  Chief Complaint  Patient presents with  . Follow-up    6 month HDA  .  History of Present Illness: Patient returns today in follow up of her dialysis access.  This continues to work reasonably well.  She is using EMLA cream which is helped with the pain on dialysis.  Occasionally, they have some difficulty with access but overall it has worked quite well for some time. Duplex today shows a widely patent left radiocephalic AV fistula without focal stenosis.  Current Outpatient Medications  Medication Sig Dispense Refill  . amLODipine (NORVASC) 5 MG tablet Take 5 mg by mouth See admin instructions. Take 5 mg by mouth daily except do not take on Tuesday, Thursday and Saturday.    Marland Kitchen aspirin 81 MG tablet Take 81 mg by mouth daily.    Marland Kitchen b complex vitamins tablet Take 1 tablet by mouth at bedtime.     . B Complex-C-Folic Acid (DIALYVITE TABLET) TABS Take 1 tablet by mouth daily.  12  . benzonatate (TESSALON) 200 MG capsule Take by mouth.    . clonazePAM (KLONOPIN) 1 MG tablet Take 1-2 tablets (1-2 mg total) by mouth daily. 180 tablet 1  . cloNIDine (CATAPRES) 0.1 MG tablet TAKE 1 TABLET (0.1 MG TOTAL) BY MOUTH 2 (TWO) TIMES DAILY    . cyanocobalamin (,VITAMIN B-12,) 1000 MCG/ML injection Inject 1,000 mcg into the muscle every 14 (fourteen) days.     Marland Kitchen epoetin alfa (EPOGEN,PROCRIT) 3000 UNIT/ML injection Inject 3,000 Units into the vein. Every tues, thur and sat with dialysis    . ethosuximide (ZARONTIN) 250 MG/5ML solution 5 ml bid 473 mL 3  . Febuxostat 80 MG TABS Take 80 mg by mouth daily.     . fexofenadine (ALLEGRA) 180 MG tablet Take 180 mg by mouth daily.     . Fluticasone-Salmeterol (ADVAIR) 250-50 MCG/DOSE AEPB Inhale 1 puff into the lungs 2 (two) times daily.    . furosemide (LASIX) 20 MG tablet Take 30 mg by mouth 2 (two) times daily.     . hydrALAZINE (APRESOLINE) 100 MG tablet  Take 50 mg by mouth See admin instructions. Take 100 mg by mouth twice daily on non-dialysis days. Take 100 mg by mouth at bedtime on dialysis days Tuesday, Thursday and Saturday    . HYDROcodone-acetaminophen (NORCO/VICODIN) 5-325 MG tablet Take 1 tablet by mouth every 6 (six) hours as needed.     . insulin glargine (LANTUS) 100 UNIT/ML injection Inject 22 Units into the skin at bedtime.     . insulin lispro (HUMALOG) 100 UNIT/ML injection Inject 10-15 Units into the skin 3 (three) times daily with meals. Per sliding scale    . LAMICTAL 200 MG tablet TAKE 1 AND 1/2 TABLETS (300 MG TOTAL) BY MOUTH 2 (TWO) TIMES DAILY. 270 tablet 1  . levETIRAcetam (KEPPRA) 250 MG tablet Take Bid on non -dialysis days, 3 a day on hemodialysis days. 270 tablet 5  . levothyroxine (SYNTHROID) 150 MCG tablet Take 150 mcg by mouth daily before breakfast.     . lidocaine-prilocaine (EMLA) cream Apply topically as directed.    . metoprolol succinate (TOPROL XL) 50 MG 24 hr tablet Take 1 tablet (50 mg total) by mouth 2 (two) times daily. Take with or immediately following a meal. (Patient taking differently: Take 75 mg by mouth 2 (two) times daily.) 60 tablet 11  . montelukast (  SINGULAIR) 10 MG tablet Take 10 mg by mouth at bedtime.    Marland Kitchen nystatin-triamcinolone (MYCOLOG II) cream Apply 1 application topically 2 (two) times daily. (Patient taking differently: Apply 1 application topically daily as needed (rash).) 30 g 1  . pantoprazole (PROTONIX) 40 MG tablet Take 40 mg by mouth every morning.     . Probiotic Product (ALIGN) 4 MG CAPS Take 4 mg by mouth daily.     Marland Kitchen triamcinolone (NASACORT) 55 MCG/ACT AERO nasal inhaler Place 2 sprays into the nose at bedtime.     Marland Kitchen UNIFINE PENTIPS 32G X 4 MM MISC USE UP TO 5 TIMES DAILY AS DIRECTED  3   No current facility-administered medications for this visit.    Past Medical History:  Diagnosis Date  . A-V fistula ([place])   . Acute bacterial sinusitis 04/19/2014  . Adaptive colitis    . Anginal pain ([place])   . Anxiety   . Anxiety and depression   . Arthritis   . Asthma   . 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 Benign essential HTN 12/26/2014  . Bipolar disorder ([employer])   . Bronchitis   . Carotid artery narrowing 12/22/2014  . Cervical prolapse   . CHF (congestive heart failure) ([place])   . CKD (chronic kidney disease)   . Concussion 01-14-14   Fall   . COPD (chronic obstructive pulmonary disease) ([place])   . Coronary artery disease   . Depression   . Diabetes mellitus without complication ([place])   . Dialysis patient ([place])   . Diplopia   . Dupuytren's contracture of foot   . Essential (primary) hypertension 02/12/2015  . Family history of thyroid problem   . GERD (gastroesophageal reflux disease)   . Gout   . Heart disease    enlarged because of COPD  . Heart murmur   . Hepatomegaly   . HLD (hyperlipidemia)   . Hyperactive airway disease   . Hyperkalemia   . Hypertension   . Hyperthyroidism   . Irritable colon   . Morbid obesity ([place])   . Multiple thyroid nodules   . OAB (overactive bladder)   . Pernicious anemia   . Post menopausal syndrome   . Post-concussion syndrome 03/08/2014  . Post-traumatic brain syndrome   . Prolapsed uterus   . PSVT (paroxysmal supraventricular tachycardia) ([place])   . Renal calculus   . Renal disease    w/ GFR 29-may be due to diabetes  . Seizures ([place])    last seizure 2012. has had spells since with no knowledge. last was 1 month ago  . Sleep apnea   . 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 Transient alteration of awareness 11/10/2012  . Urinary incontinence with continuous leakage 03/08/2014    Past Surgical History:  Procedure Laterality Date  . A/V FISTULAGRAM Left 07/21/2016   Procedure: A/V Fistulagram;  Surgeon: Algernon Huxley, MD;  Location: [place] [facility];  Service: Cardiovascular;  Laterality: Left;   . A/V FISTULAGRAM Left 12/24/2016   Procedure: A/V Fistulagram;  Surgeon: Algernon Huxley, MD;  Location: [place] [facility];  Service: Cardiovascular;  Laterality: Left;  . A/V FISTULAGRAM Left 06/29/2017   Procedure: A/V FISTULAGRAM;  Surgeon: Algernon Huxley, MD;  Location: [place] [facility];  Service: Cardiovascular;  Laterality: Left;  . A/V FISTULAGRAM Left 03/08/2018   Procedure:  A/V FISTULAGRAM;  Surgeon: Algernon Huxley, MD;  Location: [place] [facility];  Service: Cardiovascular;  Laterality: Left;  . A/V FISTULAGRAM Left 04/11/2019   Procedure: A/V FISTULAGRAM;  Surgeon: Algernon Huxley, MD;  Location: [place] [facility];  Service: Cardiovascular;  Laterality: Left;  . A/V SHUNT INTERVENTION N/A 07/21/2016   Procedure: A/V Shunt Intervention;  Surgeon: Algernon Huxley, MD;  Location: [place] [facility];  Service: Cardiovascular;  Laterality: N/A;  . A/V SHUNT INTERVENTION N/A 12/24/2016   Procedure: A/V SHUNT INTERVENTION;  Surgeon: Algernon Huxley, MD;  Location: [place] [facility];  Service: Cardiovascular;  Laterality: N/A;  . AVF    . CHOLECYSTECTOMY    . COLONOSCOPY    . COLONOSCOPY WITH PROPOFOL N/A 03/18/2017   Procedure: COLONOSCOPY WITH PROPOFOL;  Surgeon: Manya Silvas, MD;  Location: [facility] ENDOSCOPY;  Service: Endoscopy;  Laterality: N/A;  . ENDOMETRIAL BIOPSY     ablation, uterine  . ESOPHAGOGASTRODUODENOSCOPY (EGD) WITH PROPOFOL N/A 03/18/2017   Procedure: ESOPHAGOGASTRODUODENOSCOPY (EGD) WITH PROPOFOL;  Surgeon: Manya Silvas, MD;  [facility] ENDOSCOPY;  Service: Endoscopy;  Laterality: N/A;  . HERNIA REPAIR    . HERNIA REPAIR    . PERIPHERAL VASCULAR CATHETERIZATION N/A 08/23/2015   Procedure: Dialysis/Perma Catheter Insertion;  Surgeon: Algernon Huxley, MD;  Location: [place] [facility];  Service: Cardiovascular;  Laterality: N/A;  . PERIPHERAL VASCULAR CATHETERIZATION N/A 11/09/2015   Procedure: Dialysis/Perma Catheter Removal;  Surgeon: Katha Cabal, MD;   Location: [place] [facility];  Service: Cardiovascular;  Laterality: N/A;  . PERIPHERAL VASCULAR CATHETERIZATION Left 04/23/2016   Procedure: A/V Fistulagram;  Surgeon: Algernon Huxley, MD;  Location: [place] [facility];  Service: Cardiovascular;  Laterality: Left;  . PORT A CATH REVISION       Social History   Tobacco Use  . Smoking status: Never Smoker  . Smokeless tobacco: Never Used  Vaping Use  . Vaping Use: Never used  Substance Use Topics  . Alcohol use: No  . Drug use: No      Family History  Problem Relation Age of Onset  . Diabetes Mother   . Lumbar disc disease Mother   . Migraines Mother   . Hypertension Mother   . Cancer - Prostate Father        mild  . Diabetes Father   . Hypertension Father   . Breast cancer Neg Hx   . Ovarian cancer Neg Hx   . Colon cancer Neg Hx   . Heart disease Neg Hx   . Kidney cancer Neg Hx   . Bladder Cancer Neg Hx     Allergies  Allergen Reactions  . Cinnamon Other (See Comments)    Raises blood pressure  . Procrit [Epoetin Alfa-Epbx] Other (See Comments)    Seizures, grand mal  . Ace Inhibitors Cough  . Azithromycin Other (See Comments)    seizures  . Iron Other (See Comments)    Can not take in IV Form - causes seizures   . Neomycin-Bacitracin Zn-Polymyx Other (See Comments)    Unknown  . Other   . Sulfa Antibiotics Other (See Comments)    Due to risks for seizures.    REVIEW OF SYSTEMS(Negative unless checked)  Constitutional: '[]'$ ??Weight loss '[]'$ ??Fever '[]'$ ??Chills Cardiac: '[]'$ ??Chest pain '[]'$ ??Chest pressure '[]'$ ??Palpitations '[]'$ ??Shortness of breath when laying flat '[]'$ ??Shortness of breath at rest '[]'$ ??Shortness of breath with exertion. Vascular: '[]'$ ??Pain in legs with walking '[]'$ ??Pain in legs at rest '[]'$ ??Pain  in legs when laying flat '[]'$ ??Claudication '[]'$ ??Pain in feet when walking '[]'$ ??Pain in feet at rest '[]'$ ??Pain in feet when laying flat '[]'$ ??History of DVT '[]'$ ??Phlebitis '[x]'$ ??Swelling in  legs '[]'$ ??Varicose veins '[]'$ ??Non-healing ulcers Pulmonary: '[]'$ ??Uses home oxygen '[]'$ ??Productive cough '[]'$ ??Hemoptysis '[]'$ ??Wheeze '[]'$ ??COPD '[]'$ ??Asthma Neurologic: '[]'$ ??Dizziness '[]'$ ??Blackouts '[]'$ ??Seizures '[]'$ ??History of stroke '[]'$ ??History of TIA '[]'$ ??Aphasia '[]'$ ??Temporary blindness '[]'$ ??Dysphagia '[]'$ ??Weakness or numbness in arms '[]'$ ??Weakness or numbness in legs Musculoskeletal: '[]'$ ??Arthritis '[]'$ ??Joint swelling '[]'$ ??Joint pain '[]'$ ??Low back pain Hematologic: '[]'$ ??Easy bruising '[x]'$ ??Easy bleeding '[]'$ ??Hypercoagulable state '[]'$ ??Anemic  Gastrointestinal: '[]'$ ??Blood in stool '[]'$ ??Vomiting blood '[]'$ ??Gastroesophageal reflux/heartburn '[]'$ ??Abdominal pain Genitourinary: '[x]'$ ??Chronic kidney disease '[]'$ ??Difficult urination '[]'$ ??Frequent urination '[]'$ ??Burning with urination '[]'$ ??Hematuria Skin: '[]'$ ??Rashes '[]'$ ??Ulcers '[]'$ ??Wounds Psychological: '[]'$ ??History of anxiety '[]'$ ??History of major depression.  Physical Examination  BP 139/60 (BP Location: Right Arm)   Pulse (!) 57   Resp 16   Wt 202 lb 9.6 oz (91.9 kg)   BMI 37.06 kg/m  Gen:  WD/WN, NAD Head: [place]/AT, No temporalis wasting. Ear/Nose/Throat: Hearing grossly intact, nares w/o erythema or drainage Eyes: Conjunctiva clear. Sclera non-icteric Neck: Supple.  Trachea midline Pulmonary:  Good air movement, no use of accessory muscles.  Cardiac: RRR, no JVD Vascular: good thrill in left radiocephalic AVF Vessel Right Left  Radial Palpable Palpable                   Musculoskeletal: M/S 5/5 throughout.  No deformity or atrophy. Trace LE edema. Neurologic: Sensation grossly intact in extremities.  Symmetrical.  Speech is fluent.  Psychiatric: Judgment intact, Mood & affect appropriate for pt's clinical situation. Dermatologic: No rashes or ulcers noted.  No cellulitis or open wounds.       Labs No results found for this or any previous visit (from the past 2160 hour(s)).  Radiology No  results found.  Assessment/Plan Type 2 diabetes mellitus (HCC) blood glucose control important in reducing the progression of atherosclerotic disease. Also, involved in wound healing. On appropriate medications.   Essential (primary) hypertension blood pressure control important in reducing the progression of atherosclerotic disease. On appropriate oral medications.  ESRD on dialysis [facility]) Duplex today shows a widely patent left radiocephalic AV fistula without focal stenosis.  This continues to work well.  They are doing a fairly good job of rotating the access sites which will prolong the durability of this fistula dramatically.  There is slight aneurysmal degeneration at the access sites, but no bleeding or reason for revision at this time.  Continue to rotate access sites.  Recheck in 6 months with duplex.    Leotis Pain, MD  07/20/2020 10:16 AM    This note was created with Dragon medical transcription system.  Any errors from dictation are purely unintentional

## 2020-07-25 ENCOUNTER — Other Ambulatory Visit: Payer: Self-pay | Admitting: Internal Medicine

## 2020-07-25 DIAGNOSIS — R1084 Generalized abdominal pain: Secondary | ICD-10-CM

## 2020-07-25 DIAGNOSIS — R194 Change in bowel habit: Secondary | ICD-10-CM

## 2020-07-25 DIAGNOSIS — R110 Nausea: Secondary | ICD-10-CM

## 2020-07-30 ENCOUNTER — Ambulatory Visit: Payer: Medicare Other

## 2020-08-06 ENCOUNTER — Telehealth (INDEPENDENT_AMBULATORY_CARE_PROVIDER_SITE_OTHER): Payer: Self-pay | Admitting: Vascular Surgery

## 2020-08-06 NOTE — Telephone Encounter (Signed)
Patient called back and was scheduled.

## 2020-08-06 NOTE — Telephone Encounter (Signed)
lvm for patient to callback to be scheduled. Dialysis facility sent over an order for patient to come in to be seen with an HDA.   This note is for documentation purposes only

## 2020-08-17 ENCOUNTER — Other Ambulatory Visit: Payer: Self-pay | Admitting: Neurology

## 2020-08-22 ENCOUNTER — Ambulatory Visit (INDEPENDENT_AMBULATORY_CARE_PROVIDER_SITE_OTHER): Payer: Medicare Other | Admitting: Nurse Practitioner

## 2020-08-22 ENCOUNTER — Ambulatory Visit (INDEPENDENT_AMBULATORY_CARE_PROVIDER_SITE_OTHER): Payer: Medicare Other

## 2020-08-22 ENCOUNTER — Other Ambulatory Visit: Payer: Self-pay

## 2020-08-22 ENCOUNTER — Encounter (INDEPENDENT_AMBULATORY_CARE_PROVIDER_SITE_OTHER): Payer: Self-pay | Admitting: Nurse Practitioner

## 2020-08-22 VITALS — BP 160/74 | HR 61 | Resp 16 | Wt 202.6 lb

## 2020-08-22 DIAGNOSIS — N186 End stage renal disease: Secondary | ICD-10-CM | POA: Diagnosis not present

## 2020-08-22 DIAGNOSIS — E1122 Type 2 diabetes mellitus with diabetic chronic kidney disease: Secondary | ICD-10-CM

## 2020-08-22 DIAGNOSIS — Z992 Dependence on renal dialysis: Secondary | ICD-10-CM

## 2020-08-22 DIAGNOSIS — I10 Essential (primary) hypertension: Secondary | ICD-10-CM

## 2020-08-23 ENCOUNTER — Ambulatory Visit: Payer: Self-pay | Admitting: Urology

## 2020-08-24 ENCOUNTER — Other Ambulatory Visit: Payer: Self-pay

## 2020-08-24 ENCOUNTER — Ambulatory Visit
Admission: RE | Admit: 2020-08-24 | Discharge: 2020-08-24 | Disposition: A | Discharge status: Home / Self Care | Payer: Medicare Other | Source: Ambulatory Visit | Attending: Internal Medicine | Admitting: Internal Medicine

## 2020-08-24 DIAGNOSIS — R1084 Generalized abdominal pain: Secondary | ICD-10-CM

## 2020-08-24 DIAGNOSIS — R194 Change in bowel habit: Secondary | ICD-10-CM | POA: Diagnosis present

## 2020-08-24 DIAGNOSIS — R110 Nausea: Secondary | ICD-10-CM | POA: Diagnosis present

## 2020-08-27 ENCOUNTER — Encounter (INDEPENDENT_AMBULATORY_CARE_PROVIDER_SITE_OTHER): Payer: Self-pay | Admitting: Nurse Practitioner

## 2020-08-27 NOTE — Progress Notes (Signed)
Subjective:    Patient ID: NATAIYA CZARNOWSKI, female    DOB: 04/12/1961, 60 y.o.   MRN: [id number] Chief Complaint  Patient presents with  . Follow-up    Ref [facility] arterial pressure HDA    The patient returns to the office for followup of their dialysis access. The function of the access has been stable. The patient denies increased bleeding time or increased recirculation. Patient denies difficulty with cannulation.  The patient notes that they have had instances of increased arterial pressures but that has only happen occasionally.  The patient denies hand pain or other symptoms consistent with steal phenomena.  No significant arm swelling.  The patient denies redness or swelling at the access site. The patient denies fever or chills at home or while on dialysis.  The patient denies amaurosis fugax or recent TIA symptoms. There are no recent neurological changes noted. The patient denies claudication symptoms or rest pain symptoms. The patient denies history of DVT, PE or superficial thrombophlebitis. The patient denies recent episodes of angina or shortness of breath.    Today the patient has a flow volume of 1259.  No evidence of significant stricture or stenosis.  This is improved from her previous flow volume of 1086.     Review of Systems  Hematological: Does not bruise/bleed easily.  All other systems reviewed and are negative.      Objective:   Physical Exam Vitals reviewed.  HENT:     Head: Normocephalic.  Cardiovascular:     Rate and Rhythm: Normal rate.     Pulses:          Radial pulses are 2+ on the left side.     Arteriovenous access: left arteriovenous access is present.    Comments: Good thrill and bruit Pulmonary:     Effort: Pulmonary effort is normal.  Skin:    General: Skin is warm and dry.  Neurological:     Mental Status: She is alert and oriented to person, place, and time. Mental status is at baseline.  Psychiatric:        Mood and Affect:  Mood normal.        Behavior: Behavior normal.        Thought Content: Thought content normal.        Judgment: Judgment normal.     BP (!) 160/74 (BP Location: Right Arm)   Pulse 61   Resp 16   Wt 202 lb 9.6 oz (91.9 kg)   BMI 37.06 kg/m   Past Medical History:  Diagnosis Date  . A-V fistula ([place])   . Acute bacterial sinusitis 04/19/2014  . Adaptive colitis   . Anginal pain ([place])   . Anxiety   . Anxiety and depression   . Arthritis   . Asthma   . 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 Benign essential HTN 12/26/2014  . Bipolar disorder ([place])   . Bronchitis   . Carotid artery narrowing 12/22/2014  . Cervical prolapse   . CHF (congestive heart failure) ([place])   . CKD (chronic kidney disease)   . Concussion 01-14-14   Fall   . COPD (chronic obstructive pulmonary disease) ([place])   . Coronary artery disease   . Depression   . Diabetes mellitus without complication ([place])   . Dialysis patient ([place])   . Diplopia   . Dupuytren's contracture of foot   . Essential (primary)  hypertension 02/12/2015  . Family history of thyroid problem   . GERD (gastroesophageal reflux disease)   . Gout   . Heart disease    enlarged because of COPD  . Heart murmur   . Hepatomegaly   . HLD (hyperlipidemia)   . Hyperactive airway disease   . Hyperkalemia   . Hypertension   . Hyperthyroidism   . Irritable colon   . Morbid obesity ([place])   . Multiple thyroid nodules   . OAB (overactive bladder)   . Pernicious anemia   . Post menopausal syndrome   . Post-concussion syndrome 03/08/2014  . Post-traumatic brain syndrome   . Prolapsed uterus   . PSVT (paroxysmal supraventricular tachycardia) ([place])   . Renal calculus   . Renal disease    w/ GFR 29-may be due to diabetes  . Seizures ([place])    last seizure 2012. has had spells since with no knowledge. last was 1 month ago  . Sleep apnea   . 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 Transient alteration of awareness 11/10/2012  . Urinary incontinence with continuous leakage 03/08/2014    Social History   Socioeconomic History  . Marital status: Single    Spouse name: Not on file  . Number of children: 0  . Years of education:  college  . Highest education level: Not on file  Occupational History  . Occupation: not employed  Tobacco Use  . Smoking status: Never Smoker  . Smokeless tobacco: Never Used  Vaping Use  . Vaping Use: Never used  Substance and Sexual Activity  . Alcohol use: No  . Drug use: No  . Sexual activity: Not Currently  Other Topics Concern  . Not on file  Social History Narrative   Patient is single and lives with her parents.   Patient is disabled.   Patient has a college education.   Patient is right- handed.   Patient drinks tea occasionally. 2 glasses of tea when they eat out.   Social Determinants of Health   Financial Resource Strain: Not on file  Food Insecurity: Not on file  Transportation Needs: Not on file  Physical Activity: Not on file  Stress: Not on file  Social Connections: Not on file  Intimate Partner Violence: Not on file    Past Surgical History:  Procedure Laterality Date  . A/V FISTULAGRAM Left 07/21/2016   Procedure: A/V Fistulagram;  Surgeon: Algernon Huxley, MD;  Location: [facility];  Service: Cardiovascular;  Laterality: Left;  . A/V FISTULAGRAM Left 12/24/2016   Procedure: A/V Fistulagram;  Surgeon: Algernon Huxley, MD;  Location: [facility];  Service: Cardiovascular;  Laterality: Left;  . A/V FISTULAGRAM Left 06/29/2017   Procedure: A/V FISTULAGRAM;  Surgeon: Algernon Huxley, MD;  Location: [facility];  Service: Cardiovascular;  Laterality: Left;  . A/V FISTULAGRAM Left 03/08/2018   Procedure: A/V FISTULAGRAM;  Surgeon: Algernon Huxley, MD;  Location: [facility] [place] [facility];  Service: Cardiovascular;  Laterality: Left;  . A/V FISTULAGRAM Left  04/11/2019   Procedure: A/V FISTULAGRAM;  Surgeon: Algernon Huxley, MD;  Location: [facility];  Service: Cardiovascular;  Laterality: Left;  . A/V SHUNT INTERVENTION N/A 07/21/2016   Procedure: A/V Shunt Intervention;  Surgeon: Algernon Huxley, MD;  Location: [place] [facility];  Service: Cardiovascular;  Laterality: N/A;  . A/V SHUNT INTERVENTION N/A 12/24/2016   Procedure: A/V SHUNT  INTERVENTION;  Surgeon: Algernon Huxley, MD;  Location: [facility];  Service: Cardiovascular;  Laterality: N/A;  . AVF    . CHOLECYSTECTOMY    . COLONOSCOPY    . COLONOSCOPY WITH PROPOFOL N/A 03/18/2017   Procedure: COLONOSCOPY WITH PROPOFOL;  Surgeon: Manya Silvas, MD;  Location: [facility] ENDOSCOPY;  Service: Endoscopy;  Laterality: N/A;  . ENDOMETRIAL BIOPSY     ablation, uterine  . ESOPHAGOGASTRODUODENOSCOPY (EGD) WITH PROPOFOL N/A 03/18/2017   Procedure: ESOPHAGOGASTRODUODENOSCOPY (EGD) WITH PROPOFOL;  Surgeon: Manya Silvas, MD;  Location: [facility] ENDOSCOPY;  Service: Endoscopy;  Laterality: N/A;  . HERNIA REPAIR    . HERNIA REPAIR    . PERIPHERAL VASCULAR CATHETERIZATION N/A 08/23/2015   Procedure: Dialysis/Perma Catheter Insertion;  Surgeon: Algernon Huxley, MD;  Location: [place] [facility];  Service: Cardiovascular;  Laterality: N/A;  . PERIPHERAL VASCULAR CATHETERIZATION N/A 11/09/2015   Procedure: Dialysis/Perma Catheter Removal;  Surgeon: Katha Cabal, MD;  Location: [place] [facility];  Service: Cardiovascular;  Laterality: N/A;  . PERIPHERAL VASCULAR CATHETERIZATION Left 04/23/2016   Procedure: A/V Fistulagram;  Surgeon: Algernon Huxley, MD;  Location: [place] [facility];  Service: Cardiovascular;  Laterality: Left;  . PORT A CATH REVISION      Family History  Problem Relation Age of Onset  . Diabetes Mother   . Lumbar disc disease Mother   . Migraines Mother   . Hypertension Mother   . Cancer - Prostate Father        mild  . Diabetes Father   . Hypertension Father   . Breast  cancer Neg Hx   . Ovarian cancer Neg Hx   . Colon cancer Neg Hx   . Heart disease Neg Hx   . Kidney cancer Neg Hx   . Bladder Cancer Neg Hx     Allergies  Allergen Reactions  . Cinnamon Other (See Comments)    Raises blood pressure  . Procrit [Epoetin Alfa-Epbx] Other (See Comments)    Seizures, grand mal  . Ace Inhibitors Cough  . Azithromycin Other (See Comments)    seizures  . Iron Other (See Comments)    Can not take in IV Form - causes seizures   . Neomycin-Bacitracin Zn-Polymyx Other (See Comments)    Unknown  . Other   . Sulfa Antibiotics Other (See Comments)    Due to risks for seizures.    CBC Latest Ref Rng & Units 01/29/2019 01/24/2019 08/21/2018  WBC 4.0 - 10.5 K/uL 10.3 9.8 8.2  Hemoglobin 12.0 - 15.0 g/dL 8.6(L) 10.2(L) 9.9(L)  Hematocrit 36.0 - 46.0 % 26.8(L) 32.4(L) 31.5(L)  Platelets 150 - 400 K/uL 163 125(L) 107(L)      CMP     Component Value Date/Time   NA 137 01/29/2019 1431   NA 142 03/08/2014 1634   NA 141 07/04/2011 0747   K 4.5 01/29/2019 1431   K 4.6 07/04/2011 0747   CL 97 (L) 01/29/2019 1431   CL 109 (H) 07/04/2011 0747   CO2 29 01/29/2019 1431   CO2 20 (L) 07/04/2011 0747   GLUCOSE 186 (H) 01/29/2019 1431   GLUCOSE 198 (H) 07/04/2011 0747   BUN 25 (H) 01/29/2019 1431   BUN 82 (HH) 03/08/2014 1634   BUN 65 (H) 07/04/2011 0747   CREATININE 3.70 (H) 01/29/2019 1431   CREATININE 1.95 (H) 07/04/2011 0747   CALCIUM 8.5 (L) 01/29/2019 1431   CALCIUM 8.6 07/04/2011 0747   PROT 6.6 01/29/2019 1431  PROT 6.8 03/08/2014 1634   PROT 7.3 07/04/2011 0747   ALBUMIN 3.6 01/29/2019 1431   ALBUMIN 4.4 03/08/2014 1634   ALBUMIN 3.8 07/04/2011 0747   AST 20 01/29/2019 1431   AST 11 (L) 07/04/2011 0747   ALT 17 01/29/2019 1431   ALT 14 07/04/2011 0747   ALKPHOS 175 (H) 01/29/2019 1431   ALKPHOS 188 (H) 07/04/2011 0747   BILITOT 0.8 01/29/2019 1431   BILITOT 0.2 07/04/2011 0747   GFRNONAA 13 (L) 01/29/2019 1431   GFRNONAA 29 (L) 07/04/2011  0747   GFRAA 15 (L) 01/29/2019 1431   GFRAA 35 (L) 07/04/2011 0747     No results found.     Assessment & Plan:   1. ESRD on dialysis [facility]) Recommend:  The patient is doing well and currently has adequate dialysis access. The patient's dialysis center is not reporting any access issues.  The issues the patient had appeared to be transient and are not affecting her dialysis treatment sessions. Flow pattern is stable when compared to the prior ultrasound.  We will maintain a closer follow-up, due to reported issues. The patient should have a duplex ultrasound of the dialysis access in 3 months. The patient will follow-up with me in the office after each ultrasound     2. Type 2 diabetes mellitus with chronic kidney disease on chronic dialysis, unspecified whether long term insulin use ([place]) Continue hypoglycemic medications as already ordered, these medications have been reviewed and there are no changes at this time.  Hgb A1C to be monitored as already arranged by primary service   3. Benign essential HTN Continue antihypertensive medications as already ordered, these medications have been reviewed and there are no changes at this time.    Current Outpatient Medications on File Prior to Visit  Medication Sig Dispense Refill  . aspirin 81 MG tablet Take 81 mg by mouth daily.    Marland Kitchen b complex vitamins tablet Take 1 tablet by mouth at bedtime.     . B Complex-C-Folic Acid (DIALYVITE TABLET) TABS Take 1 tablet by mouth daily.  12  . benzonatate (TESSALON) 200 MG capsule Take by mouth.    . clonazePAM (KLONOPIN) 1 MG tablet Take 1-2 tablets (1-2 mg total) by mouth daily. 180 tablet 1  . cloNIDine (CATAPRES) 0.1 MG tablet TAKE 1 TABLET (0.1 MG TOTAL) BY MOUTH 2 (TWO) TIMES DAILY    . cyanocobalamin (,VITAMIN B-12,) 1000 MCG/ML injection Inject 1,000 mcg into the muscle every 14 (fourteen) days.     Marland Kitchen epoetin alfa (EPOGEN,PROCRIT) 3000 UNIT/ML injection Inject 3,000 Units into the  vein. Every tues, thur and sat with dialysis    . ethosuximide (ZARONTIN) 250 MG/5ML solution 5 ml bid 473 mL 3  . Febuxostat 80 MG TABS Take 80 mg by mouth daily.     . fexofenadine (ALLEGRA) 180 MG tablet Take 180 mg by mouth daily.     . Fluticasone-Salmeterol (ADVAIR) 250-50 MCG/DOSE AEPB Inhale 1 puff into the lungs 2 (two) times daily.    . furosemide (LASIX) 20 MG tablet Take 30 mg by mouth 2 (two) times daily.     . hydrALAZINE (APRESOLINE) 100 MG tablet Take 50 mg by mouth See admin instructions. Take 100 mg by mouth twice daily on non-dialysis days. Take 100 mg by mouth at bedtime on dialysis days Tuesday, Thursday and Saturday    . HYDROcodone-acetaminophen (NORCO/VICODIN) 5-325 MG tablet Take 1 tablet by mouth every 6 (six) hours as needed.     Marland Kitchen  insulin glargine (LANTUS) 100 UNIT/ML injection Inject 22 Units into the skin at bedtime.     . insulin lispro (HUMALOG) 100 UNIT/ML injection Inject 10-15 Units into the skin 3 (three) times daily with meals. Per sliding scale    . LAMICTAL 200 MG tablet TAKE 1 AND 1/2 TABLETS (300 MG TOTAL) BY MOUTH 2 (TWO) TIMES DAILY. 270 tablet 1  . levETIRAcetam (KEPPRA) 250 MG tablet Take Bid on non -dialysis days, 3 a day on hemodialysis days. 270 tablet 5  . levothyroxine (SYNTHROID) 150 MCG tablet Take 150 mcg by mouth daily before breakfast.     . lidocaine-prilocaine (EMLA) cream Apply topically as directed.    . metoprolol succinate (TOPROL XL) 50 MG 24 hr tablet Take 1 tablet (50 mg total) by mouth 2 (two) times daily. Take with or immediately following a meal. (Patient taking differently: Take 75 mg by mouth 2 (two) times daily.) 60 tablet 11  . montelukast (SINGULAIR) 10 MG tablet Take 10 mg by mouth at bedtime.    Marland Kitchen nystatin-triamcinolone (MYCOLOG II) cream Apply 1 application topically 2 (two) times daily. (Patient taking differently: Apply 1 application topically daily as needed (rash).) 30 g 1  . pantoprazole (PROTONIX) 40 MG tablet Take 40  mg by mouth every morning.     . Probiotic Product (ALIGN) 4 MG CAPS Take 4 mg by mouth daily.     Marland Kitchen triamcinolone (NASACORT) 55 MCG/ACT AERO nasal inhaler Place 2 sprays into the nose at bedtime.     Marland Kitchen UNIFINE PENTIPS 32G X 4 MM MISC USE UP TO 5 TIMES DAILY AS DIRECTED  3  . amLODipine (NORVASC) 5 MG tablet Take 5 mg by mouth See admin instructions. Take 5 mg by mouth daily except do not take on Tuesday, Thursday and Saturday.     No current facility-administered medications on file prior to visit.    There are no Patient Instructions on file for this visit. No follow-ups on file.   Kris Hartmann, NP

## 2020-09-10 ENCOUNTER — Other Ambulatory Visit: Payer: Self-pay | Admitting: Internal Medicine

## 2020-09-10 DIAGNOSIS — Z1231 Encounter for screening mammogram for malignant neoplasm of breast: Secondary | ICD-10-CM

## 2020-09-14 ENCOUNTER — Other Ambulatory Visit: Payer: Self-pay

## 2020-09-14 ENCOUNTER — Encounter: Payer: Self-pay | Admitting: Emergency Medicine

## 2020-09-14 ENCOUNTER — Emergency Department
Admission: EM | Admit: 2020-09-14 | Discharge: 2020-09-14 | Disposition: A | Discharge status: Home / Self Care | Payer: Medicare Other | Attending: Emergency Medicine | Admitting: Emergency Medicine

## 2020-09-14 ENCOUNTER — Emergency Department: Payer: Medicare Other

## 2020-09-14 DIAGNOSIS — K219 Gastro-esophageal reflux disease without esophagitis: Secondary | ICD-10-CM | POA: Insufficient documentation

## 2020-09-14 DIAGNOSIS — E11649 Type 2 diabetes mellitus with hypoglycemia without coma: Secondary | ICD-10-CM | POA: Insufficient documentation

## 2020-09-14 DIAGNOSIS — E039 Hypothyroidism, unspecified: Secondary | ICD-10-CM | POA: Insufficient documentation

## 2020-09-14 DIAGNOSIS — I509 Heart failure, unspecified: Secondary | ICD-10-CM | POA: Insufficient documentation

## 2020-09-14 DIAGNOSIS — Z992 Dependence on renal dialysis: Secondary | ICD-10-CM | POA: Insufficient documentation

## 2020-09-14 DIAGNOSIS — J45909 Unspecified asthma, uncomplicated: Secondary | ICD-10-CM | POA: Insufficient documentation

## 2020-09-14 DIAGNOSIS — E1122 Type 2 diabetes mellitus with diabetic chronic kidney disease: Secondary | ICD-10-CM | POA: Insufficient documentation

## 2020-09-14 DIAGNOSIS — Z7982 Long term (current) use of aspirin: Secondary | ICD-10-CM | POA: Insufficient documentation

## 2020-09-14 DIAGNOSIS — R1084 Generalized abdominal pain: Secondary | ICD-10-CM | POA: Insufficient documentation

## 2020-09-14 DIAGNOSIS — Z79899 Other long term (current) drug therapy: Secondary | ICD-10-CM | POA: Diagnosis not present

## 2020-09-14 DIAGNOSIS — M545 Low back pain, unspecified: Secondary | ICD-10-CM | POA: Diagnosis not present

## 2020-09-14 DIAGNOSIS — D631 Anemia in chronic kidney disease: Secondary | ICD-10-CM | POA: Diagnosis not present

## 2020-09-14 DIAGNOSIS — Z794 Long term (current) use of insulin: Secondary | ICD-10-CM | POA: Diagnosis not present

## 2020-09-14 DIAGNOSIS — Z7951 Long term (current) use of inhaled steroids: Secondary | ICD-10-CM | POA: Insufficient documentation

## 2020-09-14 DIAGNOSIS — J449 Chronic obstructive pulmonary disease, unspecified: Secondary | ICD-10-CM

## 2020-09-14 DIAGNOSIS — I132 Hypertensive heart and chronic kidney disease with heart failure and with stage 5 chronic kidney disease, or end stage renal disease: Secondary | ICD-10-CM | POA: Diagnosis not present

## 2020-09-14 DIAGNOSIS — N186 End stage renal disease: Secondary | ICD-10-CM | POA: Insufficient documentation

## 2020-09-14 LAB — HEPATIC FUNCTION PANEL
ALT: 19 U/L (ref 0–44)
AST: 23 U/L (ref 15–41)
Albumin: 3.9 g/dL (ref 3.5–5.0)
Alkaline Phosphatase: 107 U/L (ref 38–126)
Bilirubin, Direct: 0.1 mg/dL (ref 0.0–0.2)
Indirect Bilirubin: 0.6 mg/dL (ref 0.3–0.9)
Total Bilirubin: 0.7 mg/dL (ref 0.3–1.2)
Total Protein: 6.8 g/dL (ref 6.5–8.1)

## 2020-09-14 LAB — BASIC METABOLIC PANEL
Anion gap: 16 — ABNORMAL HIGH (ref 5–15)
BUN: 53 mg/dL — ABNORMAL HIGH (ref 6–20)
CO2: 25 mmol/L (ref 22–32)
Calcium: 9.3 mg/dL (ref 8.9–10.3)
Chloride: 98 mmol/L (ref 98–111)
Creatinine, Ser: 5.92 mg/dL — ABNORMAL HIGH (ref 0.44–1.00)
GFR, Estimated: 8 mL/min — ABNORMAL LOW (ref >60)
Glucose, Bld: 208 mg/dL — ABNORMAL HIGH (ref 70–99)
Potassium: 4 mmol/L (ref 3.5–5.1)
Sodium: 139 mmol/L (ref 135–145)

## 2020-09-14 LAB — CBC
HCT: 32.9 % — ABNORMAL LOW (ref 36.0–46.0)
Hemoglobin: 10.6 g/dL — ABNORMAL LOW (ref 12.0–15.0)
MCH: 34.5 pg — ABNORMAL HIGH (ref 26.0–34.0)
MCHC: 32.2 g/dL (ref 30.0–36.0)
MCV: 107.2 fL — ABNORMAL HIGH (ref 80.0–100.0)
Platelets: 142 10*3/uL — ABNORMAL LOW (ref 150–400)
RBC: 3.07 MIL/uL — ABNORMAL LOW (ref 3.87–5.11)
RDW: 13.7 % (ref 11.5–15.5)
WBC: 10.6 10*3/uL — ABNORMAL HIGH (ref 4.0–10.5)
nRBC: 0 % (ref 0.0–0.2)

## 2020-09-14 LAB — LIPASE, BLOOD: Lipase: 43 U/L (ref 11–51)

## 2020-09-14 LAB — TROPONIN I (HIGH SENSITIVITY)
Troponin I (High Sensitivity): 10 ng/L (ref <18)
Troponin I (High Sensitivity): 10 ng/L (ref <18)

## 2020-09-14 NOTE — ED Provider Notes (Signed)
[facility] Emergency Department Provider Note  ____________________________________________  Time seen: Approximately 11:38 PM  I have reviewed the triage vital signs and the nursing notes.   HISTORY  Chief Complaint Chest Pain    HPI Nina Johnston is a 60 y.o. female with a history of bipolar disorder, CHF,'s ESRD on hemodialysis Tuesday Thursday Saturday, COPD  who comes the ED complaining of what she calls chest pain but actually indicates is mid abdominal pain.  It is diffuse, nonradiating, intermittent lasting a few minutes at a time.  No aggravating or alleviating factors.  Also associated with low back pain.  Denies acute tearing onset.  Denies dizziness.  No significant exertional symptoms or worsening dyspnea on exertion or other anginal symptoms.  No vomiting or diarrhea, no constipation.     Past Medical History:  Diagnosis Date  . A-V fistula ([place])   . Acute bacterial sinusitis 04/19/2014  . Adaptive colitis   . Anginal pain ([place])   . Anxiety   . Anxiety and depression   . Arthritis   . Asthma   . 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 Benign essential HTN 12/26/2014  . Bipolar disorder ([place])   . Bronchitis   . Carotid artery narrowing 12/22/2014  . Cervical prolapse   . CHF (congestive heart failure) ([place])   . CKD (chronic kidney disease)   . Concussion 01-14-14   Fall   . COPD (chronic obstructive pulmonary disease) ([place])   . Coronary artery disease   . Depression   . Diabetes mellitus without complication ([place])   . Dialysis patient ([place])   . Diplopia   . Dupuytren's contracture of foot   . Essential (primary) hypertension 02/12/2015  . Family history of thyroid problem   . GERD (gastroesophageal reflux disease)   . Gout   . Heart disease    enlarged because of COPD  . Heart murmur   . Hepatomegaly   . HLD (hyperlipidemia)   . Hyperactive airway  disease   . Hyperkalemia   . Hypertension   . Hyperthyroidism   . Irritable colon   . Morbid obesity ([place])   . Multiple thyroid nodules   . OAB (overactive bladder)   . Pernicious anemia   . Post menopausal syndrome   . Post-concussion syndrome 03/08/2014  . Post-traumatic brain syndrome   . Prolapsed uterus   . PSVT (paroxysmal supraventricular tachycardia) ([place])   . Renal calculus   . Renal disease    w/ GFR 29-may be due to diabetes  . Seizures ([place])    last seizure 2012. has had spells since with no knowledge. last was 1 month ago  . Sleep apnea   . 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 Transient alteration of awareness 11/10/2012  . Urinary incontinence with continuous leakage 03/08/2014     Patient Active Problem List   Diagnosis Date Noted  . CKD (chronic kidney disease) stage 5, GFR less than 15 ml/min (HCC) 10/26/2019  . Mild aortic valve stenosis 12/27/2018  . LVH (left ventricular hypertrophy) due to hypertensive disease, without heart failure 04/14/2018  . Partial symptomatic epilepsy with complex partial seizures, not intractable, without status epilepticus ([place]) 04/07/2018  . Diabetes mellitus with ESRD (end-stage renal disease) ([place]) 04/07/2018  . Ovarian cyst, left 01/15/2018  . Spells of decreased attentiveness 12/02/2017  . Postdialysis syndrome  12/02/2017  . 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 Chronic superficial gastritis without bleeding 11/18/2017  . Hx of adenomatous colonic polyps 11/18/2017  . Anemia due to pre-ESRD treated with erythropoietin 09/23/2017  . Hemodialysis access, AV graft ([place]) 09/23/2017  . ESRD on dialysis ([place]) 02/06/2017  . Heme positive stool 01/07/2017  . Delay kidney tx func d/t fluid overload requiring acute dialysis ([place]) 12/03/2016  . Menopause 10/22/2016  . Status post endometrial ablation 10/22/2016  . Swelling of limb 04/18/2016  . Kidney dialysis as the cause of  abnormal reaction of the patient, or of later complication, without mention of misadventure at the time of the procedure (CODE) 04/18/2016  . Large liver 02/05/2016  . S/P repair of ventral hernia 02/01/2016  . End-stage renal disease on hemodialysis ([place]) 08/24/2015  . OAB (overactive bladder) 06/07/2015  . Incontinence 06/07/2015  . Airway hyperreactivity 06/06/2015  . Binocular vision disorder with diplopia 06/06/2015  . Type 2 diabetes mellitus ([place]) 06/01/2015  . Severe diabetic hypoglycemia ([place]) 04/16/2015  . Convulsions ([place]) 02/12/2015  . Hypoglycemic reaction 02/12/2015  . Other symptoms and signs concerning food and fluid intake 02/12/2015  . Absence of menstruation 02/12/2015  . Absolute anemia 02/12/2015  . Post menopausal syndrome 02/12/2015  . Calculus of kidney 02/12/2015  . Chronic obstructive pulmonary disease ([place]) 02/12/2015  . Bone/cartilage disorder 02/12/2015  . Urinary system disease 02/12/2015  . Encounter for gynecological examination without abnormal finding 02/12/2015  . Generalized convulsive epilepsy ([place]) 02/12/2015  . Essential (primary) hypertension 02/12/2015  . Gout 02/12/2015  . Hernia, internal 02/12/2015  . Hypoglycemia 02/12/2015  . Adaptive colitis 02/12/2015  . Arthritis, degenerative 02/12/2015  . Postprocedural state 02/12/2015  . Dupuytren's contracture of foot 02/12/2015  . Cutaneous eruption 02/12/2015  . 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 Absence of bladder continence 02/12/2015  . Cervical prolapse 02/12/2015  . Seizure ([place]) 02/12/2015  . Benign essential HTN 12/26/2014  . Carotid artery narrowing 12/22/2014  . Chest pain 12/22/2014  . Combined fat and carbohydrate induced hyperlipemia 12/22/2014  . Bilateral carotid artery stenosis 12/22/2014  . Acute bacterial sinusitis 04/19/2014  . Anemia in chronic illness 03/16/2014  . Urinary incontinence with continuous leakage 03/08/2014  .  Post-concussion syndrome 03/08/2014  . Total urinary incontinence 03/08/2014  . 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 Apnea, sleep 01/17/2014  . Patient awaiting renal transplant 06/03/2013  . Bipolar affective disorder ([place]) 12/16/2012  . GERD (gastroesophageal reflux disease) 12/16/2012  . Class 2 severe obesity due to excess calories with serious comorbidity and body mass index (BMI) of 37.0 to 37.9 in adult ([place]) 12/16/2012  . Obstructive apnea 12/16/2012  . Addison anemia 12/16/2012  . Seizure disorder ([place]) 12/16/2012  . Ataxia 11/10/2012  . 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 Transient alteration of awareness 11/10/2012  . Severe obesity (BMI >= 40) ([place]) 11/10/2012  . Morbid obesity ([place]) 11/10/2012  . Speech disorder 11/10/2012  . Encounter for general adult medical examination without abnormal findings 09/08/2012  . Multinodular goiter 01/29/2012  . Cardiac murmur 10/29/2011  . High potassium 10/28/2011  . Hypothyroidism 03/12/2011     Past Surgical History:  Procedure Laterality Date  . A/V FISTULAGRAM Left 07/21/2016   Procedure: A/V Fistulagram;  Surgeon: Algernon Huxley, MD;  Location: [place] CV LAB;  Service: Cardiovascular;  Laterality: Left;  . A/V FISTULAGRAM Left 12/24/2016   Procedure: A/V Fistulagram;  Surgeon: Algernon Huxley, MD;  Location: [facility]  INVASIVE CV LAB;  Service: Cardiovascular;  Laterality: Left;  . A/V FISTULAGRAM Left 06/29/2017   Procedure: A/V FISTULAGRAM;  Surgeon: Algernon Huxley, MD;  Location: [place] [facility] LAB;  Service: Cardiovascular;  Laterality: Left;  . A/V FISTULAGRAM Left 03/08/2018   Procedure: A/V FISTULAGRAM;  Surgeon: Algernon Huxley, MD;  Location: [place] CV LAB;  Service: Cardiovascular;  Laterality: Left;  . A/V FISTULAGRAM Left 04/11/2019   Procedure: A/V FISTULAGRAM;  Surgeon: Algernon Huxley, MD;  Location: [place] [facility] LAB;  Service: Cardiovascular;  Laterality: Left;  . A/V SHUNT INTERVENTION N/A 07/21/2016    Procedure: A/V Shunt Intervention;  Surgeon: Algernon Huxley, MD;  Location: [place] CV LAB;  Service: Cardiovascular;  Laterality: N/A;  . A/V SHUNT INTERVENTION N/A 12/24/2016   Procedure: A/V SHUNT INTERVENTION;  Surgeon: Algernon Huxley, MD;  Location: [place] CV LAB;  Service: Cardiovascular;  Laterality: N/A;  . AVF    . CHOLECYSTECTOMY    . COLONOSCOPY    . COLONOSCOPY WITH PROPOFOL N/A 03/18/2017   Procedure: COLONOSCOPY WITH PROPOFOL;  Surgeon: Manya Silvas, MD;  Location: [facility] ENDOSCOPY;  Service: Endoscopy;  Laterality: N/A;  . ENDOMETRIAL BIOPSY     ablation, uterine  . ESOPHAGOGASTRODUODENOSCOPY (EGD) WITH PROPOFOL N/A 03/18/2017   Procedure: ESOPHAGOGASTRODUODENOSCOPY (EGD) WITH PROPOFOL;  Surgeon: Manya Silvas, MD;  Location: [facility] ENDOSCOPY;  Service: Endoscopy;  Laterality: N/A;  . HERNIA REPAIR    . HERNIA REPAIR    . PERIPHERAL VASCULAR CATHETERIZATION N/A 08/23/2015   Procedure: Dialysis/Perma Catheter Insertion;  Surgeon: Algernon Huxley, MD;  Location: [place] CV LAB;  Service: Cardiovascular;  Laterality: N/A;  . PERIPHERAL VASCULAR CATHETERIZATION N/A 11/09/2015   Procedure: Dialysis/Perma Catheter Removal;  Surgeon: Katha Cabal, MD;  Location: [place] CV LAB;  Service: Cardiovascular;  Laterality: N/A;  . PERIPHERAL VASCULAR CATHETERIZATION Left 04/23/2016   Procedure: A/V Fistulagram;  Surgeon: Algernon Huxley, MD;  Location: [place] CV LAB;  Service: Cardiovascular;  Laterality: Left;  . PORT A CATH REVISION       Prior to Admission medications   Medication Sig Start Date End Date Taking? Authorizing Provider  amLODipine (NORVASC) 5 MG tablet Take 5 mg by mouth See admin instructions. Take 5 mg by mouth daily except do not take on Tuesday, Thursday and Saturday. 11/26/16 07/20/20  [provider]  aspirin 81 MG tablet Take 81 mg by mouth daily.    [provider]  b complex vitamins tablet Take 1 tablet by mouth at  bedtime.     [provider]  B Complex-C-Folic Acid (DIALYVITE TABLET) TABS Take 1 tablet by mouth daily. 11/14/17   [provider]  benzonatate (TESSALON) 200 MG capsule Take by mouth. 10/30/17   [provider]  clonazePAM (KLONOPIN) 1 MG tablet Take 1-2 tablets (1-2 mg total) by mouth daily. 03/19/20   Dohmeier, Asencion Partridge, MD  cloNIDine (CATAPRES) 0.1 MG tablet TAKE 1 TABLET (0.1 MG TOTAL) BY MOUTH 2 (TWO) TIMES DAILY 09/06/18   [provider]  cyanocobalamin (,VITAMIN B-12,) 1000 MCG/ML injection Inject 1,000 mcg into the muscle every 14 (fourteen) days.     [provider]  epoetin alfa (EPOGEN,PROCRIT) 3000 UNIT/ML injection Inject 3,000 Units into the vein. Every tues, thur and sat with dialysis    [provider]  ethosuximide (ZARONTIN) 250 MG/5ML solution 5 ml bid 03/19/20   Dohmeier, Asencion Partridge, MD  Febuxostat 80 MG TABS Take 80 mg by  mouth daily.     [provider]  fexofenadine (ALLEGRA) 180 MG tablet Take 180 mg by mouth daily.     [provider]  Fluticasone-Salmeterol (ADVAIR) 250-50 MCG/DOSE AEPB Inhale 1 puff into the lungs 2 (two) times daily.    [provider]  furosemide (LASIX) 20 MG tablet Take 30 mg by mouth 2 (two) times daily.     [provider]  hydrALAZINE (APRESOLINE) 100 MG tablet Take 50 mg by mouth See admin instructions. Take 100 mg by mouth twice daily on non-dialysis days. Take 100 mg by mouth at bedtime on dialysis days Tuesday, Thursday and Saturday    [provider]  HYDROcodone-acetaminophen (NORCO/VICODIN) 5-325 MG tablet Take 1 tablet by mouth every 6 (six) hours as needed.  03/17/19   [provider]  insulin glargine (LANTUS) 100 UNIT/ML injection Inject 22 Units into the skin at bedtime.     [provider]  insulin lispro (HUMALOG) 100 UNIT/ML injection Inject 10-15 Units into the skin 3 (three) times daily with meals. Per sliding scale     [provider]  LAMICTAL 200 MG tablet TAKE 1 AND 1/2 TABLETS (300 MG TOTAL) BY MOUTH 2 (TWO) TIMES DAILY. 08/20/20   Dohmeier, Asencion Partridge, MD  levETIRAcetam (KEPPRA) 250 MG tablet Take Bid on non -dialysis days, 3 a day on hemodialysis days. 03/19/20   Dohmeier, Asencion Partridge, MD  levothyroxine (SYNTHROID) 150 MCG tablet Take 150 mcg by mouth daily before breakfast.     [provider]  lidocaine-prilocaine (EMLA) cream Apply topically as directed. 01/15/20   [provider]  metoprolol succinate (TOPROL XL) 50 MG 24 hr tablet Take 1 tablet (50 mg total) by mouth 2 (two) times daily. Take with or immediately following a meal. Patient taking differently: Take 75 mg by mouth 2 (two) times daily. 04/16/16   Dohmeier, Asencion Partridge, MD  montelukast (SINGULAIR) 10 MG tablet Take 10 mg by mouth at bedtime.    [provider]  nystatin-triamcinolone (MYCOLOG II) cream Apply 1 application topically 2 (two) times daily. Patient taking differently: Apply 1 application topically daily as needed (rash). 10/22/16   Defrancesco, Alanda Slim, MD  pantoprazole (PROTONIX) 40 MG tablet Take 40 mg by mouth every morning.     [provider]  Probiotic Product (ALIGN) 4 MG CAPS Take 4 mg by mouth daily.     [provider]  triamcinolone (NASACORT) 55 MCG/ACT AERO nasal inhaler Place 2 sprays into the nose at bedtime.     [provider]  UNIFINE PENTIPS 32G X 4 MM MISC USE UP TO 5 TIMES DAILY AS DIRECTED 11/22/17   [provider]     Allergies Cinnamon, Procrit [epoetin alfa-epbx], Ace inhibitors, Azithromycin, Iron, Neomycin-bacitracin zn-polymyx, Other, and Sulfa antibiotics   Family History  Problem Relation Age of Onset  . Diabetes Mother   . Lumbar disc disease Mother   . Migraines Mother   . Hypertension Mother   . Cancer - Prostate Father        mild  . Diabetes Father   . Hypertension Father   . Breast cancer Neg Hx   . Ovarian cancer Neg Hx   .  Colon cancer Neg Hx   . Heart disease Neg Hx   . Kidney cancer Neg Hx   . Bladder Cancer Neg Hx     Social History Social History   Tobacco Use  . Smoking status: Never Smoker  . Smokeless tobacco: Never Used  Vaping  Use  . Vaping Use: Never used  Substance Use Topics  . Alcohol use: No  . Drug use: No    Review of Systems  Constitutional:   No fever or chills.  ENT:   No sore throat. No rhinorrhea. Cardiovascular:   No chest pain or syncope. Respiratory:   No dyspnea or cough. Gastrointestinal:   Positive as above for abdominal pain without vomiting and diarrhea.  Musculoskeletal:   Negative for focal pain or swelling All other systems reviewed and are negative except as documented above in ROS and HPI.  ____________________________________________   PHYSICAL EXAM:  VITAL SIGNS: ED Triage Vitals  Enc Vitals Group     BP 09/14/20 1951 (!) 172/90     Pulse Rate 09/14/20 1951 64     Resp 09/14/20 1951 20     Temp 09/14/20 1951 98.7 F (37.1 C)     Temp Source 09/14/20 1951 Oral     SpO2 09/14/20 1951 95 %     Weight 09/14/20 1952 203 lb (92.1 kg)     Height 09/14/20 1952 '5\' 2"'$  (1.575 m)     Head Circumference --      Peak Flow --      Pain Score 09/14/20 1952 6     Pain Loc --      Pain Edu? --      Excl. in [place]? --     Vital signs reviewed, nursing assessments reviewed.   Constitutional:   Alert and oriented. Non-toxic appearance. Eyes:   Conjunctivae are normal. EOMI. PERRL. ENT      Head:   Normocephalic and atraumatic.      Nose:   Wearing a mask.      Mouth/Throat:   Wearing a mask.      Neck:   No meningismus. Full ROM. Hematological/Lymphatic/Immunilogical:   No cervical lymphadenopathy. Cardiovascular:   RRR. Symmetric bilateral radial and DP pulses.  No murmurs. Cap refill less than 2 seconds. Respiratory:   Normal respiratory effort without tachypnea/retractions. Breath sounds are clear and equal bilaterally. No  wheezes/rales/rhonchi. Gastrointestinal:   Soft with mild diffuse tenderness, nonfocal.  There is a small umbilical hernia left of the umbilicus which does not contain bowel.. Non distended.   No rebound, rigidity, or guarding.  Musculoskeletal:   Normal range of motion in all extremities. No joint effusions.  No lower extremity tenderness.  Trace chronic bilateral lower extremity edema.  There is tenderness of the left-sided paraspinous musculature of the lumbar back which reproduces the neck pain. Neurologic:   Normal speech and language.  Motor grossly intact. No acute focal neurologic deficits are appreciated.  Skin:    Skin is warm, dry and intact. No rash noted.  No petechiae, purpura, or bullae.  ____________________________________________    LABS (pertinent positives/negatives) (all labs ordered are listed, but only abnormal results are displayed) Labs Reviewed  BASIC METABOLIC PANEL - Abnormal; Notable for the following components:      Result Value   Glucose, Bld 208 (*)    BUN 53 (*)    Creatinine, Ser 5.92 (*)    GFR, Estimated 8 (*)    Anion gap 16 (*)    All other components within normal limits  CBC - Abnormal; Notable for the following components:   WBC 10.6 (*)    RBC 3.07 (*)    Hemoglobin 10.6 (*)    HCT 32.9 (*)    MCV 107.2 (*)    MCH 34.5 (*)  Platelets 142 (*)    All other components within normal limits  HEPATIC FUNCTION PANEL  LIPASE, BLOOD  TROPONIN I (HIGH SENSITIVITY)  TROPONIN I (HIGH SENSITIVITY)   ____________________________________________   EKG   Date: 09/14/2020  Rate: 9 normal sinus rhythm rate of 64, normal axis and intervals.  Normal QRS ST segments and T waves  Rhythm: normal sinus rhythm  QRS Axis: normal  Intervals: normal  ST/T Wave abnormalities: normal  Conduction Disutrbances: none  Narrative Interpretation: unremarkable      ____________________________________________    RADIOLOGY  CT ABDOMEN PELVIS WO  CONTRAST  Result Date: 09/14/2020 CLINICAL DATA:  Nonlocalized acute abdominal pain. EXAM: CT ABDOMEN AND PELVIS WITHOUT CONTRAST TECHNIQUE: Multidetector CT imaging of the abdomen and pelvis was performed following the standard protocol without IV contrast. COMPARISON:  CT abdomen pelvis 01/29/2019 FINDINGS: Lower chest: Linear atelectasis at the bases. Coronary artery calcifications. Hepatobiliary: Liver is enlarged in size. No focal liver abnormality. Status post cholecystectomy. No biliary dilatation. Pancreas: No focal lesion. Normal pancreatic contour. No surrounding inflammatory changes. No main pancreatic ductal dilatation. Spleen: Normal in size without focal abnormality. Adrenals/Urinary Tract: Interval increase in size of a 3.2 x 2.4 cm fat density left adrenal gland nodule. No right adrenal gland nodule. Bilateral renal cortical scarring and atrophy. Multiple bilateral calcified stones measuring up to 1.6 cm on the left and 1.1cm on the right. No hydronephrosis. No contour-deforming renal mass. No ureterolithiasis or hydroureter. The urinary bladder is decompressed and grossly unremarkable. Stomach/Bowel: Stomach is within normal limits. No evidence of bowel wall thickening or dilatation. Diffuse left colon diverticulosis. Appendix appears normal. Vascular/Lymphatic: No abdominal aorta or iliac aneurysm. Mild atherosclerotic plaque of the aorta and its branches. No abdominal, pelvic, or inguinal lymphadenopathy. Reproductive: Uterus and bilateral adnexa are unremarkable. Other: Redemonstration of right anterior peritoneum soft tissue density measuring 3.3 x 1.7 cm (2:50). Similar persistent finding within a ventral wall hernia measuring up to 1.6 x 1.4 cm. Another nodularity noted superiorly within the ventral wall hernia measuring 1.8 x 1.1 cm is again similar to prior. No intraperitoneal free fluid. No intraperitoneal free gas. No organized fluid collection. Musculoskeletal: Status post hernia repair  with persistent small volume ventral wall hernia in the region of the umbilicus with tiny not well visualized abdominal defect. Associated soft tissue densities within the hernias described above appears similar to prior. Surrounding fat stranding and intralesional fat stranding is again noted. No suspicious lytic or blastic osseous lesions. No acute displaced fracture. Multilevel degenerative changes of the spine. IMPRESSION: 1. Umbilical hernia with intralesional and surrounding fat stranding, correlate with physical exam for incarceration. Patient is status post hernia repair with a persistent fat containing paraumbilical ventral wall hernia with a tiny abdominal defect that is difficult to measure. Persistent chronic findings appear stable within the hernia and adjacent along the anterior peritoneum where soft tissue densities/nodules are noted. Associated intra-lesional fat stranding is again noted. Findings are nonspecific but unchanged since September 2019. 2. 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 3. Colonic diverticulosis with no acute diverticulitis. 4. Bilateral renal atrophy and scarring with nonobstructive nephrolithiasis. 5. Hepatomegaly. 6. Small hiatal hernia. 7.  Aortic Atherosclerosis (ICD10-I70.0). Electronically Signed   By: Iven Finn M.D.   On: 09/14/2020 23:29   DG Chest 2 View  Result Date: 09/14/2020 CLINICAL DATA:  Chest pain. EXAM: CHEST - 2 VIEW COMPARISON:  Chest x-ray dated January 29, 2019. FINDINGS: Chronic cardiomegaly and mild pulmonary vascular congestion. No focal consolidation, pleural effusion, or pneumothorax. No acute osseous abnormality. Old left proximal humerus fracture. IMPRESSION: 1. No acute cardiopulmonary disease. Electronically Signed   By: Titus Dubin M.D.   On: 09/14/2020 20:36     ____________________________________________   PROCEDURES Procedures  ____________________________________________  DIFFERENTIAL DIAGNOSIS   Diverticulitis, bowel obstruction, pancreatitis, intra-abdominal abscess, non-STEMI, electrolyte normality, musculoskeletal pain  CLINICAL IMPRESSION / ASSESSMENT AND PLAN / ED COURSE  Medications ordered in the ED: Medications - No data to display  Pertinent labs & imaging results that were available during my care of the patient were reviewed by me and considered in my medical decision making (see chart for details).  Nina Johnston was evaluated in Emergency Department on 09/14/2020 for the symptoms described in the history of present illness. She was evaluated in the context of the global COVID-19 pandemic, which necessitated consideration that the patient might be at risk for infection with the SARS-CoV-2 virus that causes COVID-19. Institutional protocols and algorithms that pertain to the evaluation of patients at risk for COVID-19 are in a state of rapid change based on information released by regulatory bodies including the CDC and federal and state organizations. These policies and algorithms were followed during the patient's care in the ED.   Patient presents with abdominal pain in the setting of morbid obesity and end-stage renal disease.  EMR reviewed, ultrasound abdomen complete from 3 weeks ago unremarkable, no aneurysm.    Labs unremarkable, at baseline.  Vital signs unremarkable.  She is nontoxic and exam is generally reassuring.  CT scan shows no acute findings.  Troponins negative x2, chest x-ray unremarkable.  Stable for discharge home to follow-up with PCP, continue dialysis.   Doubt ACS, PE, dissection, AAA, pneumothorax, pericarditis, mesenteric ischemia, aortic dissection.      ____________________________________________   FINAL CLINICAL IMPRESSION(S) / ED DIAGNOSES    Final diagnoses:  Generalized  abdominal pain  ESRD on hemodialysis ([place])  Chronic obstructive pulmonary disease, unspecified COPD type [facility])     ED Discharge Orders    None      Portions of this note were generated with dragon dictation software. Dictation errors may occur despite best attempts at proofreading.   Carrie Mew, MD 09/14/20 [phone]

## 2020-09-14 NOTE — ED Notes (Addendum)
Pt c/o chest pain since yesterday morning that started on the way to dialysis. Pt sts the pain is intermittent without aggravating or alleviating factors. Pt reports she had the full dialysis treatment and will go tomorrow also. Pt reports pain feels like it stabs through to her back and radiates down the L arm. Pt denies N/V, fever.  Pt notes SOB today and upper abdominal tenderness.  Pt reports dry cough x 3 months.

## 2020-09-14 NOTE — ED Triage Notes (Signed)
Pt reports that she developed chest pain, yesterday that is interment. She states that it is mid sternal and then goes into the left side of her chest. She did have SHOB with it. No diaphoresis.

## 2020-09-14 NOTE — ED Notes (Signed)
ED Provider at bedside. 

## 2020-09-14 NOTE — Discharge Instructions (Signed)
Your blood tests, chest x-ray, and CT scan of the abdomen were all okay today.  Continue with dialysis as usual and take your usual medications.  Follow-up with your doctor to continue monitoring your symptoms.

## 2020-09-14 NOTE — ED Notes (Signed)
Pt discharge instructions provided to pt and legal guardian. Signature not working, unable to sign d/c paperwork.

## 2020-09-17 IMAGING — CT CT ANGIOGRAPHY CHEST
2 of 6 series · 18 of 46 positions shown · IV contrast (APPLIED)
Comparison: 08/21/2018 chest radiograph, 07/04/2011 chest CT and
other studies

CLINICAL DATA: 57-year-old female with acute chest pain and
shortness of breath.

EXAM:
CT ANGIOGRAPHY CHEST WITH CONTRAST
TECHNIQUE: Multidetector CT imaging of the chest was performed using the
standard protocol during bolus administration of intravenous
contrast. Multiplanar CT image reconstructions and MIPs were
obtained to evaluate the vascular anatomy.
CONTRAST:  75mL OMNIPAQUE IOHEXOL 350 MG/ML SOLN

[Series 5: thins · axial · 0.61mm/px · z∈[-160,+84]mm · 16 of 268 slices shown]
[im 12/268  lung]
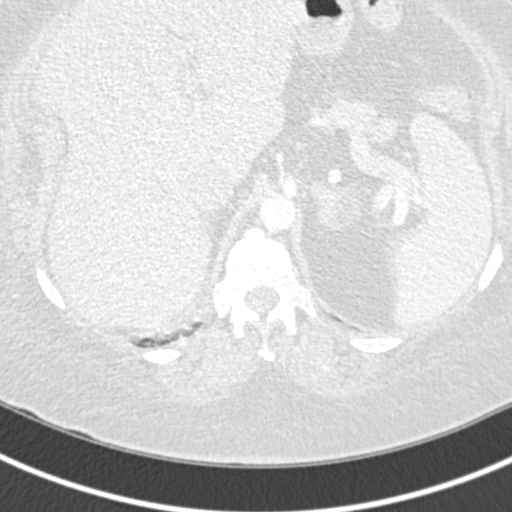
[im 35/268  soft-tissue]
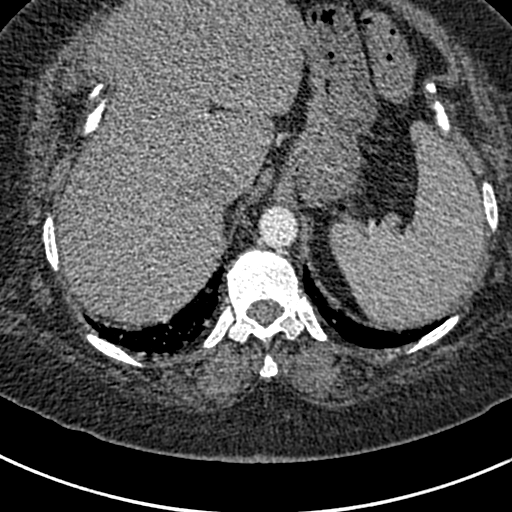
[im 47/268  lung]
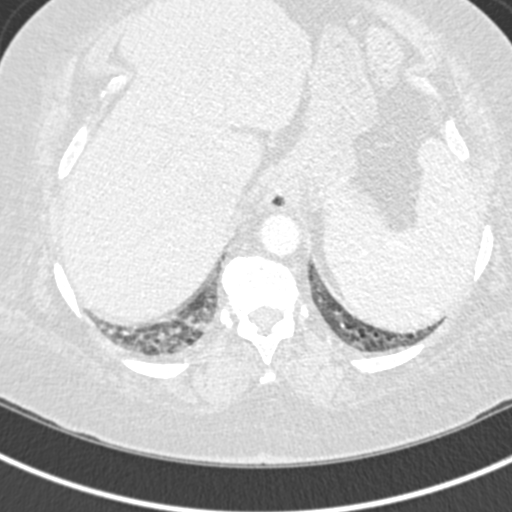
[im 59/268  soft-tissue]
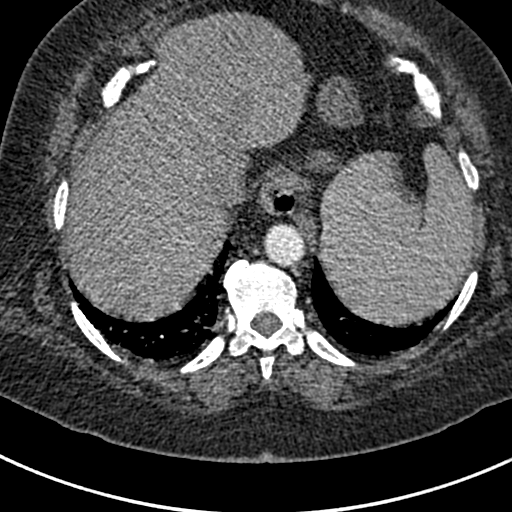
[im 82/268  lung]
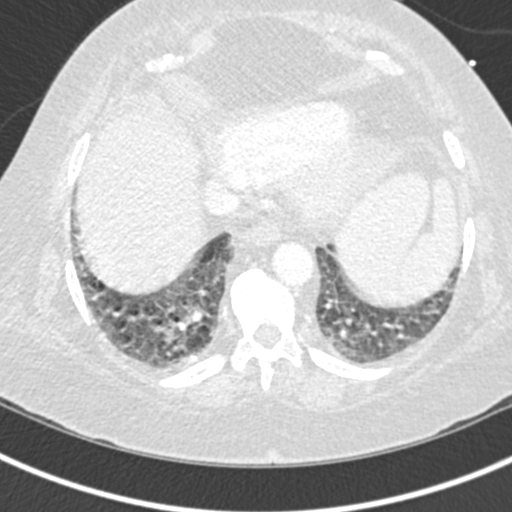
[im 93/268  soft-tissue]
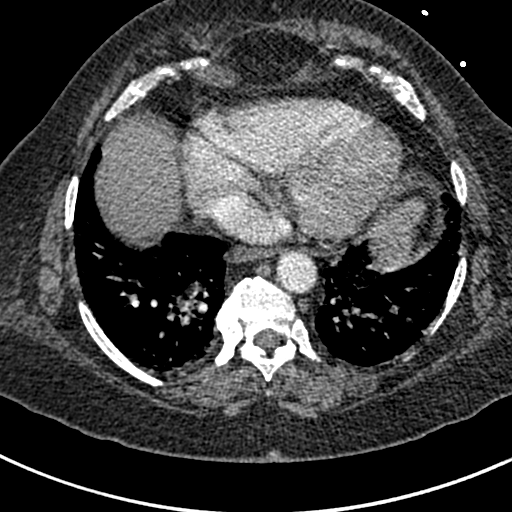
[im 105/268  lung]
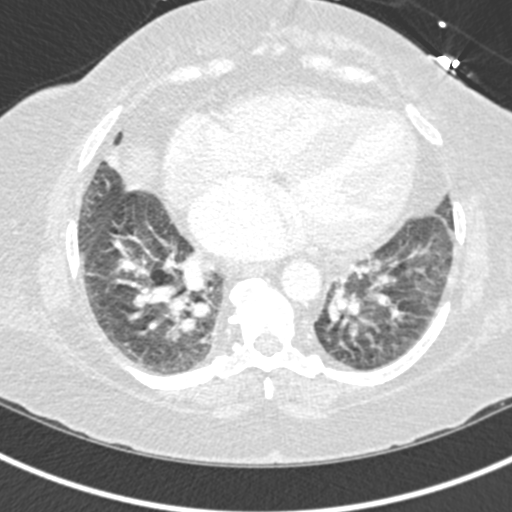
[im 128/268  soft-tissue]
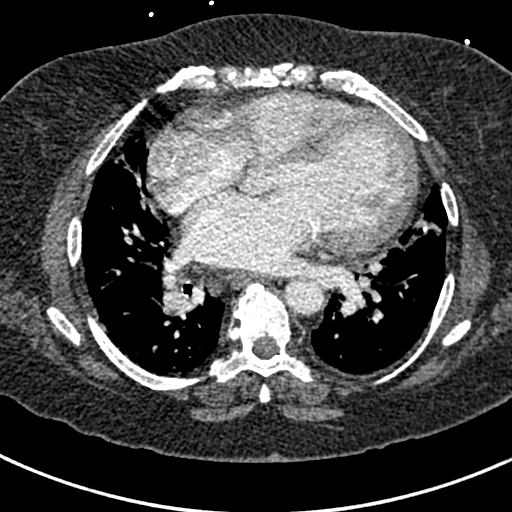
[im 140/268  lung]
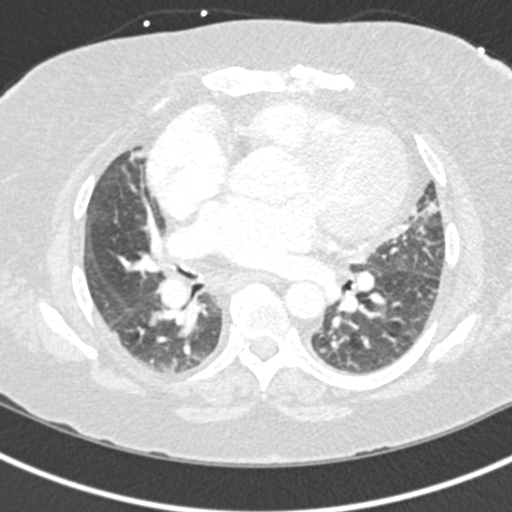
[im 163/268  soft-tissue]
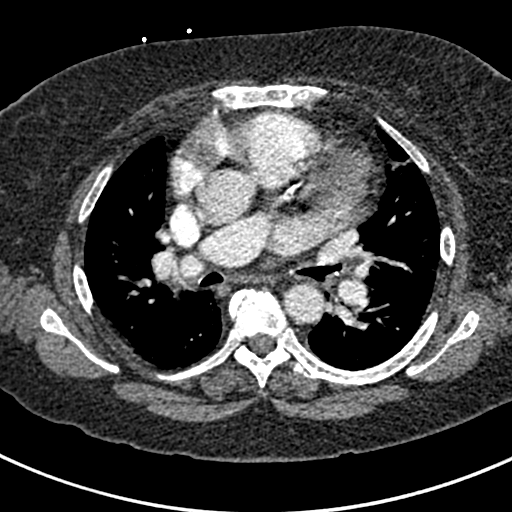
[im 175/268  lung]
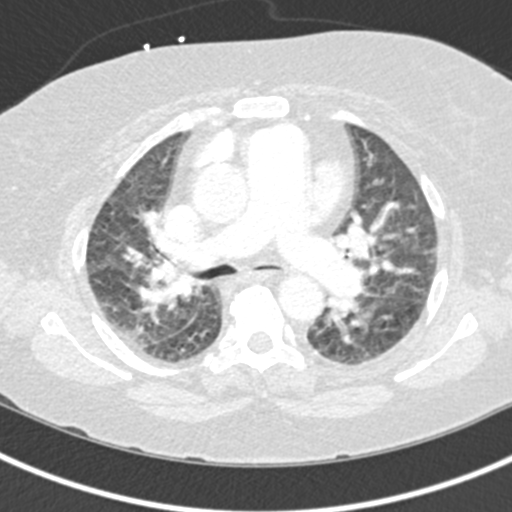
[im 186/268  soft-tissue]
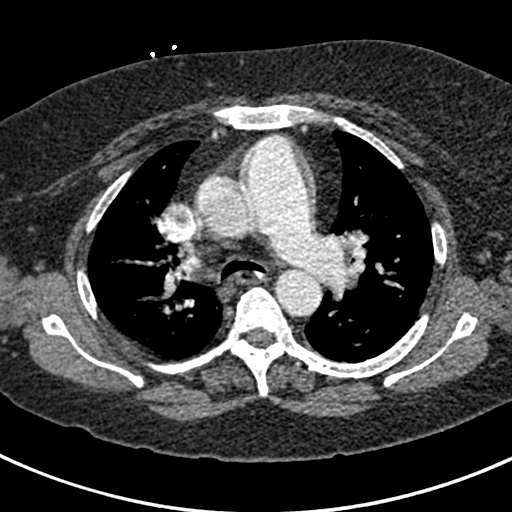
[im 209/268  lung]
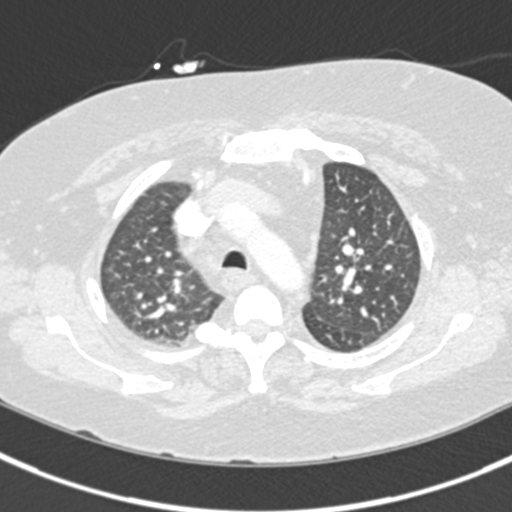
[im 221/268  soft-tissue]
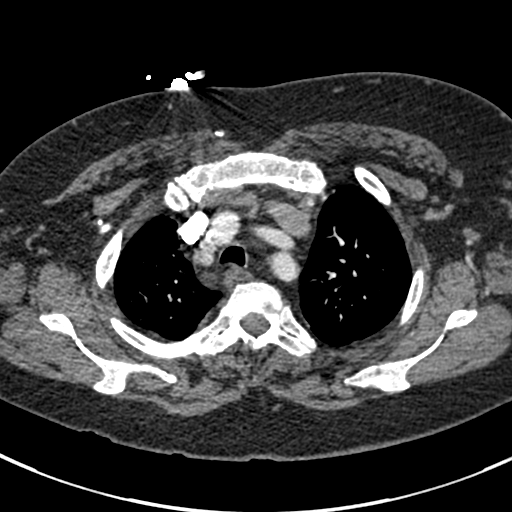
[im 233/268  lung]
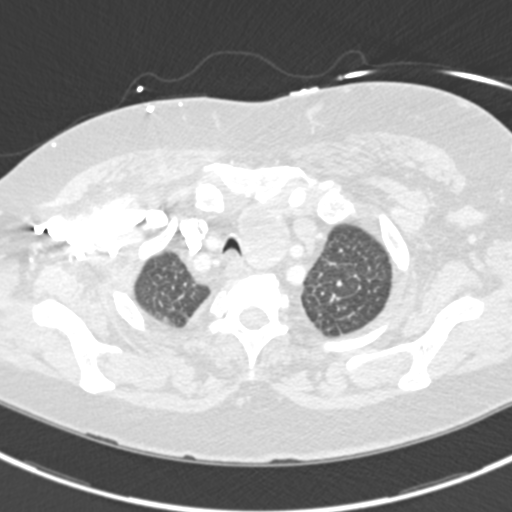
[im 256/268  soft-tissue]
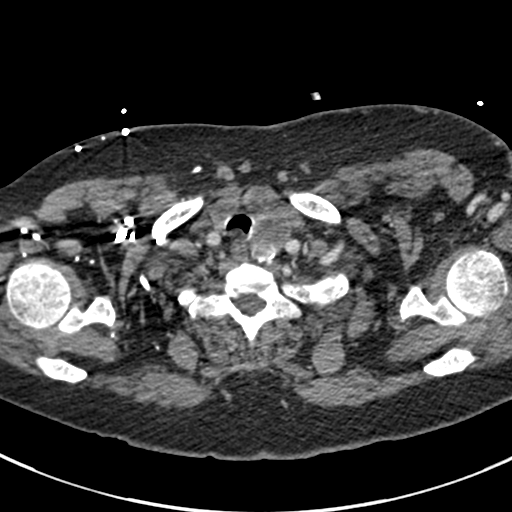

[Series 7: coronal mpr · coronal · 0.53mm/px · 2 of 105 slices shown]
[im 35/105  soft-tissue]
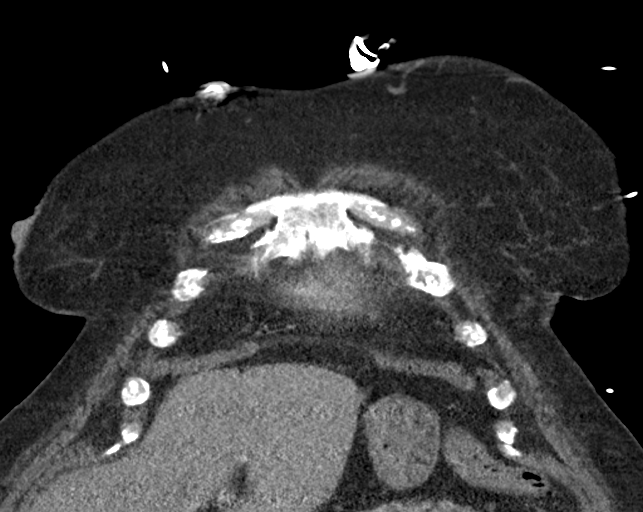
[im 70/105  soft-tissue]
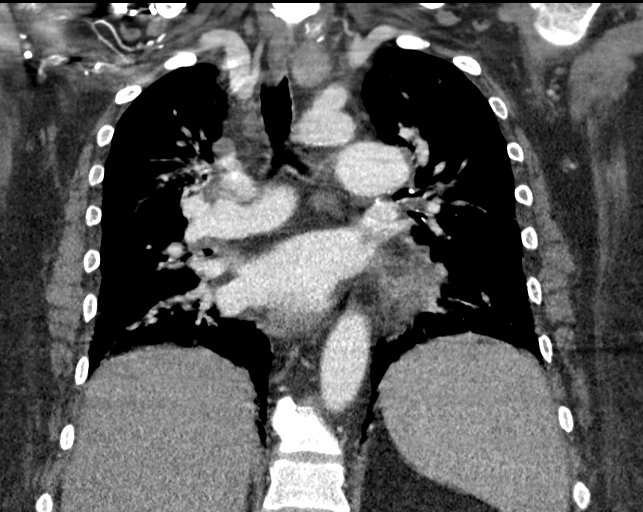

[18 of 46 positions shown; findings below may reference images not displayed]

FINDINGS: Cardiovascular: This is a technically adequate study. No pulmonary
emboli are identified. Marked cardiomegaly again noted. Enlargement
of the central pulmonary arteries noted. Coronary artery and mild
aortic atherosclerotic calcifications noted. No pericardial
effusion.

Mediastinum/Nodes: A 4 cm LEFT thyroid nodule is unchanged from
01/15/2014 cervical spine CT. No enlarged lymph nodes or new
mediastinal mass.

Lungs/Pleura: Mild bibasilar atelectasis/scarring again noted. No
consolidation, airspace disease, mass, pleural effusion or
pneumothorax.

Upper Abdomen: No acute abnormality

Musculoskeletal: No acute or suspicious bony abnormalities.

Review of the MIP images confirms the above findings.
IMPRESSION: 1. No acute abnormality.  No pulmonary emboli.
2. Marked cardiomegaly and central pulmonary artery
enlargement/pulmonary arterial hypertension.
3. Unchanged 4 cm LEFT thyroid nodule.
4. Coronary artery and Aortic Atherosclerosis ([id number]).

## 2020-09-17 IMAGING — DX PORTABLE CHEST - 1 VIEW
1 series · 1 of 1 positions shown · non-contrast
Comparison: April 10, 2016

CLINICAL DATA: Chest pain during dialysis.

EXAM:
PORTABLE CHEST 1 VIEW

[chest ap]
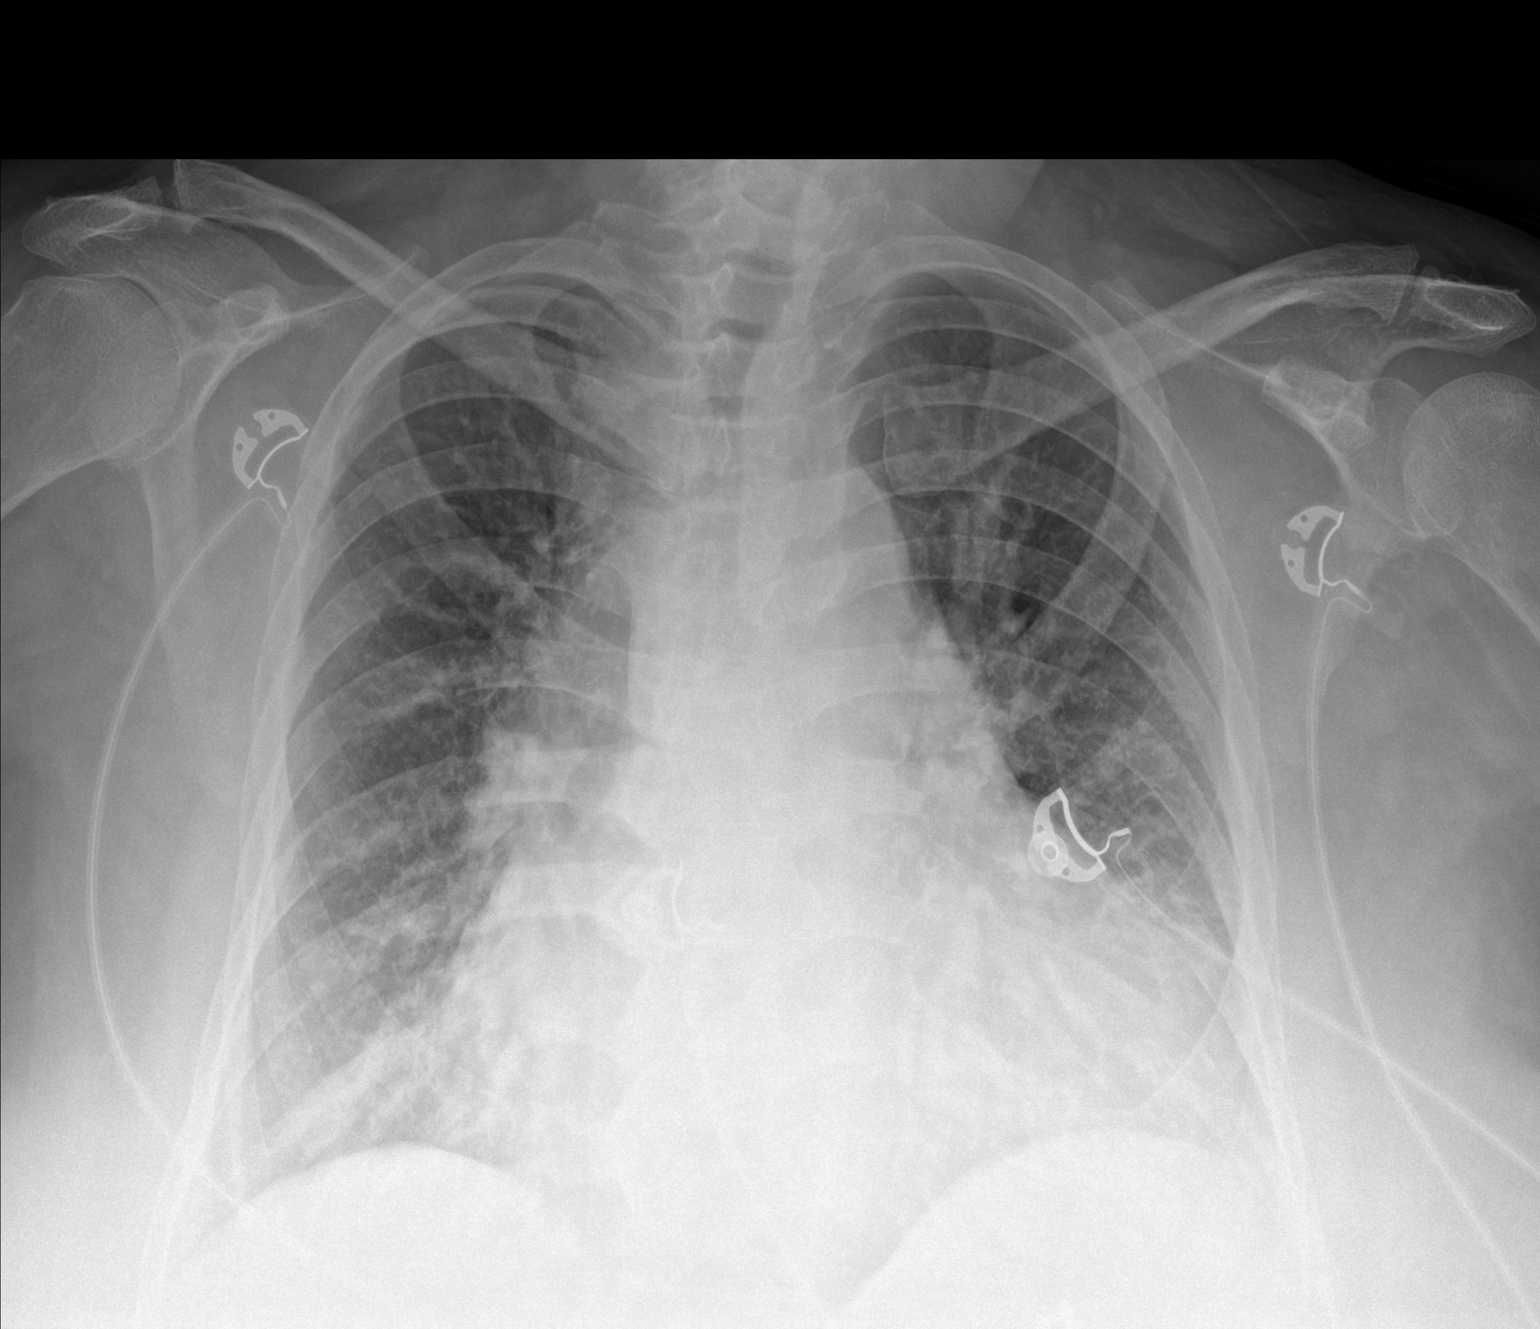

[1 of 1 positions shown; findings below may reference images not displayed]

FINDINGS: Stable cardiomegaly. The hila and mediastinum are normal. No
pneumothorax. Increased interstitial markings in the lungs. More
focal opacity in the medial right lung base.
IMPRESSION: 1. Cardiomegaly and pulmonary venous congestion/mild edema.
2. More focal opacity in the medial right lung base favored
represent atelectasis or vascular crowding.

## 2020-09-19 ENCOUNTER — Encounter: Payer: Self-pay | Admitting: Neurology

## 2020-09-19 ENCOUNTER — Ambulatory Visit (INDEPENDENT_AMBULATORY_CARE_PROVIDER_SITE_OTHER): Payer: Medicare Other | Admitting: Neurology

## 2020-09-19 VITALS — BP 132/70 | HR 60 | Ht 62.0 in | Wt 202.0 lb

## 2020-09-19 DIAGNOSIS — D631 Anemia in chronic kidney disease: Secondary | ICD-10-CM

## 2020-09-19 DIAGNOSIS — R404 Transient alteration of awareness: Secondary | ICD-10-CM

## 2020-09-19 DIAGNOSIS — G40209 Localization-related (focal) (partial) symptomatic epilepsy and epileptic syndromes with complex partial seizures, not intractable, without status epilepticus: Secondary | ICD-10-CM | POA: Diagnosis not present

## 2020-09-19 DIAGNOSIS — Z8719 Personal history of other diseases of the digestive system: Secondary | ICD-10-CM

## 2020-09-19 DIAGNOSIS — N039 Chronic nephritic syndrome with unspecified morphologic changes: Secondary | ICD-10-CM

## 2020-09-19 DIAGNOSIS — Z992 Dependence on renal dialysis: Secondary | ICD-10-CM

## 2020-09-19 DIAGNOSIS — R4789 Other speech disturbances: Secondary | ICD-10-CM

## 2020-09-19 DIAGNOSIS — Z9889 Other specified postprocedural states: Secondary | ICD-10-CM

## 2020-09-19 DIAGNOSIS — I4892 Unspecified atrial flutter: Secondary | ICD-10-CM

## 2020-09-19 DIAGNOSIS — R270 Ataxia, unspecified: Secondary | ICD-10-CM

## 2020-09-19 DIAGNOSIS — E109 Type 1 diabetes mellitus without complications: Secondary | ICD-10-CM

## 2020-09-19 DIAGNOSIS — F3175 Bipolar disorder, in partial remission, most recent episode depressed: Secondary | ICD-10-CM

## 2020-09-19 DIAGNOSIS — T8619 Other complication of kidney transplant: Secondary | ICD-10-CM

## 2020-09-19 DIAGNOSIS — N185 Chronic kidney disease, stage 5: Secondary | ICD-10-CM

## 2020-09-19 MED ORDER — CLONAZEPAM 1 MG PO TABS: 1.0000 mg | ORAL_TABLET | Freq: Every day | ORAL | 1 refills | Status: DC

## 2020-09-19 MED ORDER — LEVETIRACETAM 250 MG PO TABS: ORAL_TABLET | ORAL | 5 refills | Status: DC

## 2020-09-19 MED ORDER — LAMICTAL 200 MG PO TABS: ORAL_TABLET | ORAL | 1 refills | Status: DC

## 2020-09-19 MED ORDER — ETHOSUXIMIDE 250 MG/5ML PO SOLN: ORAL | 3 refills | Status: DC

## 2020-09-19 NOTE — Patient Instructions (Signed)
Dehydration, Adult Dehydration is condition in which there is not enough water or other fluids in the body. This happens when a person loses more fluids than he or she takes in. Important body parts cannot work right without the right amount of fluids. Any loss of fluids from the body can cause dehydration. Dehydration can be mild, worse, or very bad. It should be treated right away to keep it from getting very bad. What are the causes? This condition may be caused by:  Conditions that cause loss of water or other fluids, such as: ? Watery poop (diarrhea). ? Vomiting. ? Sweating a lot. ? Peeing (urinating) a lot.  Not drinking enough fluids, especially when you: ? Are ill. ? Are doing things that take a lot of energy to do.  Other illnesses and conditions, such as fever or infection.  Certain medicines, such as medicines that take extra fluid out of the body (diuretics).  Lack of safe drinking water.  Not being able to get enough water and food. What increases the risk? The following factors may make you more likely to develop this condition:  Having a long-term (chronic) illness that has not been treated the right way, such as: ? Diabetes. ? Heart disease. ? Kidney disease.  Being 61 years of age or older.  Having a disability.  Living in a place that is high above the ground or sea (high in altitude). The thinner, dried air causes more fluid loss.  Doing exercises that put stress on your body for a long time. What are the signs or symptoms? Symptoms of dehydration depend on how bad it is. Mild or worse dehydration  Thirst.  Dry lips or dry mouth.  Feeling dizzy or light-headed, especially when you stand up from sitting.  Muscle cramps.  Your body making: ? Dark pee (urine). Pee may be the color of tea. ? Less pee than normal. ? Less tears than normal.  Headache. Very bad dehydration  Changes in skin. Skin may: ? Be cold to the touch (clammy). ? Be blotchy  or pale. ? Not go back to normal right after you lightly pinch it and let it go.  Little or no tears, pee, or sweat.  Changes in vital signs, such as: ? Fast breathing. ? Low blood pressure. ? Weak pulse. ? Pulse that is more than 100 beats a minute when you are sitting still.  Other changes, such as: ? Feeling very thirsty. ? Eyes that look hollow (sunken). ? Cold hands and feet. ? Being mixed up (confused). ? Being very tired (lethargic) or having trouble waking from sleep. ? Short-term weight loss. ? Loss of consciousness. How is this treated? Treatment for this condition depends on how bad it is. Treatment should start right away. Do not wait until your condition gets very bad. Very bad dehydration is an emergency. You will need to go to a hospital.  Mild or worse dehydration can be treated at home. You may be asked to: ? Drink more fluids. ? Drink an oral rehydration solution (ORS). This drink helps get the right amounts of fluids and salts and minerals in the blood (electrolytes).  Very bad dehydration can be treated: ? With fluids through an IV tube. ? By getting normal levels of salts and minerals in your blood. This is often done by giving salts and minerals through a tube. The tube is passed through your nose and into your stomach. ? By treating the root cause. Follow these instructions at   home: Oral rehydration solution If told by your doctor, drink an ORS:  Make an ORS. Use instructions on the package.  Start by drinking small amounts, about  cup (120 mL) every 5-10 minutes.  Slowly drink more until you have had the amount that your doctor said to have. Eating and drinking  Drink enough clear fluid to keep your pee pale yellow. If you were told to drink an ORS, finish the ORS first. Then, start slowly drinking other clear fluids. Drink fluids such as: ? Water. Do not drink only water. Doing that can make the salt (sodium) level in your body get too low. ? Water  from ice chips you suck on. ? Fruit juice that you have added water to (diluted). ? Low-calorie sports drinks.  Eat foods that have the right amounts of salts and minerals, such as: ? Bananas. ? Oranges. ? Potatoes. ? Tomatoes. ? Spinach.  Do not drink alcohol.  Avoid: ? Drinks that have a lot of sugar. These include:  High-calorie sports drinks.  Fruit juice that you did not add water to.  Soda.  Caffeine. ? Foods that are greasy or have a lot of fat or sugar.         General instructions  Take over-the-counter and prescription medicines only as told by your doctor.  Do not take salt tablets. Doing that can make the salt level in your body get too high.  Return to your normal activities as told by your doctor. Ask your doctor what activities are safe for you.  Keep all follow-up visits as told by your doctor. This is important. Contact a doctor if:  You have pain in your belly (abdomen) and the pain: ? Gets worse. ? Stays in one place.  You have a rash.  You have a stiff neck.  You get angry or annoyed (irritable) more easily than normal.  You are more tired or have a harder time waking than normal.  You feel: ? Weak or dizzy. ? Very thirsty. Get help right away if you have:  Any symptoms of very bad dehydration.  Symptoms of vomiting, such as: ? You cannot eat or drink without vomiting. ? Your vomiting gets worse or does not go away. ? Your vomit has blood or green stuff in it.  Symptoms that get worse with treatment.  A fever.  A very bad headache.  Problems with peeing or pooping (having a bowel movement), such as: ? Watery poop that gets worse or does not go away. ? Blood in your poop (stool). This may cause poop to look black and tarry. ? Not peeing in 6-8 hours. ? Peeing only a small amount of very dark pee in 6-8 hours.  Trouble breathing. These symptoms may be an emergency. Do not wait to see if the symptoms will go away. Get  medical help right away. Call your local emergency services (911 in the [place] Do not drive yourself to the hospital. Summary  Dehydration is a condition in which there is not enough water or other fluids in the body. This happens when a person loses more fluids than he or she takes in.  Treatment for this condition depends on how bad it is. Treatment should be started right away. Do not wait until your condition gets very bad.  Drink enough clear fluid to keep your pee pale yellow. If you were told to drink an oral rehydration solution (ORS), finish the ORS first. Then, start slowly drinking other clear fluids.    Take over-the-counter and prescription medicines only as told by your doctor.  Get help right away if you have any symptoms of very bad dehydration. This information is not intended to replace advice given to you by your health care provider. Make sure you discuss any questions you have with your health care provider. Document Revised: 12/02/2018 Document Reviewed: 12/02/2018 [employer] Patient Education  2021 [employer]  

## 2020-09-19 NOTE — Progress Notes (Signed)
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 grade 5, Hemodialysis . She has encephalopathic spells, and had seizures after  erythropoetin injections.  Dialysis 3 times a week. creatinine is around 7. 4 hour treatments. Not driving, but wants to.    Maintained  dry weight 91.5 Kg- weight related arthritis.     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 She has not had a seizure spell since Dec 2021.    Previously recurrent convulsions due to hypoglycemia, hypotension on days of hemodialysis,  May not bet due to erythropoetin. May well be due to dehydration.  She had a lot of new spells 2021- staring, speech arrest, automatism,   Right hand shaking. Left hand fisted, She seems to turn away to the left- away from her mother when approached.  All "seizure" seemed related to dialysis or on dilaysis days.   Lasting 10-25 minutes.  Very tired and sleepy afterwards.   Plan :  Continue with current medications, I will  Refill today :  Keppra , brand name, Ethosuximide/ refilled. Klonopin refilled.     Larey Seat, MD   08-15-2019.

## 2020-09-26 ENCOUNTER — Ambulatory Visit
Admission: RE | Admit: 2020-09-26 | Discharge: 2020-09-26 | Disposition: A | Discharge status: Home / Self Care | Payer: Medicare Other | Source: Ambulatory Visit | Attending: Internal Medicine | Admitting: Internal Medicine

## 2020-09-26 ENCOUNTER — Other Ambulatory Visit: Payer: Self-pay

## 2020-09-26 DIAGNOSIS — Z1231 Encounter for screening mammogram for malignant neoplasm of breast: Secondary | ICD-10-CM | POA: Insufficient documentation

## 2020-11-20 ENCOUNTER — Other Ambulatory Visit (INDEPENDENT_AMBULATORY_CARE_PROVIDER_SITE_OTHER): Payer: Self-pay | Admitting: Nurse Practitioner

## 2020-11-20 DIAGNOSIS — N186 End stage renal disease: Secondary | ICD-10-CM

## 2020-11-21 ENCOUNTER — Ambulatory Visit (INDEPENDENT_AMBULATORY_CARE_PROVIDER_SITE_OTHER): Payer: Medicare Other

## 2020-11-21 ENCOUNTER — Ambulatory Visit (INDEPENDENT_AMBULATORY_CARE_PROVIDER_SITE_OTHER): Payer: Medicare Other | Admitting: Nurse Practitioner

## 2020-11-21 ENCOUNTER — Encounter (INDEPENDENT_AMBULATORY_CARE_PROVIDER_SITE_OTHER): Payer: Self-pay | Admitting: Nurse Practitioner

## 2020-11-21 ENCOUNTER — Other Ambulatory Visit: Payer: Self-pay

## 2020-11-21 VITALS — BP 141/63 | HR 64 | Ht 62.0 in | Wt 203.0 lb

## 2020-11-21 DIAGNOSIS — Z992 Dependence on renal dialysis: Secondary | ICD-10-CM

## 2020-11-21 DIAGNOSIS — E1122 Type 2 diabetes mellitus with diabetic chronic kidney disease: Secondary | ICD-10-CM

## 2020-11-21 DIAGNOSIS — N186 End stage renal disease: Secondary | ICD-10-CM

## 2020-11-21 DIAGNOSIS — I10 Essential (primary) hypertension: Secondary | ICD-10-CM | POA: Diagnosis not present

## 2020-12-01 ENCOUNTER — Encounter (INDEPENDENT_AMBULATORY_CARE_PROVIDER_SITE_OTHER): Payer: Self-pay | Admitting: Nurse Practitioner

## 2020-12-01 NOTE — Progress Notes (Signed)
Subjective:    Patient ID: Nina Johnston, female    DOB: [birth year], 60 y.o.   MRN: [id number] Chief Complaint  Patient presents with  . Follow-up    37moHDA    CBhakti Wojnois a 60 year old female that presents today for follow-up regarding her dialysis access.  The patient follows up sooner than her regularly scheduled follow-up due to issues at her dialysis center.  They note that she has issues with cannulation.  However the patient notes that these issues are not always consistent.  She notes that the issues occur more with her normal dialysis center.  Due to the several months she has been frequenting another dialysis center which she denies having any issues when she is there.  She denies any fevers or chills.  No signs symptoms of steal syndrome.  Today noninvasive studies show a flow volume of 1246.  The brachiocephalic vein appears to be patent throughout.   Review of Systems  Hematological:  Bruises/bleeds easily.  All other systems reviewed and are negative.     Objective:   Physical Exam Vitals reviewed.  HENT:     Head: Normocephalic.  Cardiovascular:     Rate and Rhythm: Normal rate.     Pulses:          Radial pulses are 2+ on the left side.     Arteriovenous access: Left arteriovenous access is present.    Comments: Good thrill and bruit Neurological:     Mental Status: She is alert.    BP (!) 141/63   Pulse 64   Ht '5\' 2"'$  (1.575 m)   Wt 203 lb (92.1 kg)   BMI 37.13 kg/m   Past Medical History:  Diagnosis Date  . A-V fistula ([place]   . Acute bacterial sinusitis 04/19/2014  . Adaptive colitis   . Anginal pain ([place]   . Anxiety   . Anxiety and depression   . Arthritis   . Asthma   . 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 Benign essential HTN 12/26/2014  . Bipolar disorder ([place]   . Bronchitis   . Carotid artery narrowing 12/22/2014  . Cervical prolapse   . CHF  (congestive heart failure) ([place]   . CKD (chronic kidney disease)   . Concussion 01-14-14   Fall   . COPD (chronic obstructive pulmonary disease) ([place]   . Coronary artery disease   . Depression   . Diabetes mellitus without complication ([place]   . Dialysis patient ([place]   . Diplopia   . Dupuytren's contracture of foot   . Essential (primary) hypertension 02/12/2015  . Family history of thyroid problem   . GERD (gastroesophageal reflux disease)   . Gout   . Heart disease    enlarged because of COPD  . Heart murmur   . Hepatomegaly   . HLD (hyperlipidemia)   . Hyperactive airway disease   . Hyperkalemia   . Hypertension   . Hyperthyroidism   . Irritable colon   . Morbid obesity ([place]   . Multiple thyroid nodules   . OAB (overactive bladder)   . Pernicious anemia   . Post menopausal syndrome   . Post-concussion syndrome 03/08/2014  . Post-traumatic brain syndrome   . Prolapsed uterus   . PSVT (paroxysmal supraventricular tachycardia) ([place]   . Renal calculus   . Renal disease    w/ GFR 29-may be due to  diabetes  . Seizures ([place])    last seizure 2012. has had spells since with no knowledge. last was 1 month ago  . Sleep apnea   . 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 Transient alteration of awareness 11/10/2012  . Urinary incontinence with continuous leakage 03/08/2014    Social History   Socioeconomic History  . Marital status: Single    Spouse name: Not on file  . Number of children: 0  . Years of education:  college  . Highest education level: Not on file  Occupational History  . Occupation: not employed  Tobacco Use  . Smoking status: Never  . Smokeless tobacco: Never  Vaping Use  . Vaping Use: Never used  Substance and Sexual Activity  . Alcohol use: No  . Drug use: No  . Sexual activity: Not Currently  Other Topics Concern  . Not on file  Social History Narrative   Patient is single and lives with her parents.    Patient is disabled.   Patient has a college education.   Patient is right- handed.   Patient drinks tea occasionally. 2 glasses of tea when they eat out.   Social Determinants of Health   Financial Resource Strain: Not on file  Food Insecurity: Not on file  Transportation Needs: Not on file  Physical Activity: Not on file  Stress: Not on file  Social Connections: Not on file  Intimate Partner Violence: Not on file    Past Surgical History:  Procedure Laterality Date  . A/V FISTULAGRAM Left 07/21/2016   Procedure: A/V Fistulagram;  Surgeon: Algernon Huxley, MD;  Location: [place] [facility];  Service: Cardiovascular;  Laterality: Left;  . A/V FISTULAGRAM Left 12/24/2016   Procedure: A/V Fistulagram;  Surgeon: Algernon Huxley, MD;  Location: [place] [facility];  Service: Cardiovascular;  Laterality: Left;  . A/V FISTULAGRAM Left 06/29/2017   Procedure: A/V FISTULAGRAM;  Surgeon: Algernon Huxley, MD;  Location: [place] [facility];  Service: Cardiovascular;  Laterality: Left;  . A/V FISTULAGRAM Left 03/08/2018   Procedure: A/V FISTULAGRAM;  Surgeon: Algernon Huxley, MD;  Location: [place] [facility];  Service: Cardiovascular;  Laterality: Left;  . A/V FISTULAGRAM Left 04/11/2019   Procedure: A/V FISTULAGRAM;  Surgeon: Algernon Huxley, MD;  Location: [place] [facility];  Service: Cardiovascular;  Laterality: Left;  . A/V SHUNT INTERVENTION N/A 07/21/2016   Procedure: A/V Shunt Intervention;  Surgeon: Algernon Huxley, MD;  Location: [place] [facility];  Service: Cardiovascular;  Laterality: N/A;  . A/V SHUNT INTERVENTION N/A 12/24/2016   Procedure: A/V SHUNT INTERVENTION;  Surgeon: Algernon Huxley, MD;  Location: [place] [facility];  Service: Cardiovascular;  Laterality: N/A;  . AVF    . CHOLECYSTECTOMY    . COLONOSCOPY    . COLONOSCOPY WITH PROPOFOL N/A 03/18/2017   Procedure: COLONOSCOPY WITH PROPOFOL;  Surgeon: Manya Silvas, MD;  Location[facility]COPY;  Service: Endoscopy;  Laterality:  N/A;  . ENDOMETRIAL BIOPSY     ablation, uterine  . ESOPHAGOGASTRODUODENOSCOPY (EGD) WITH PROPOFOL N/A 03/18/2017   Procedure: ESOPHAGOGASTRODUODENOSCOPY (EGD) WITH PROPOFOL;  Surgeon: Manya Silvas, MD;  Location[facility] ENDOSCOPY;  Service: Endoscopy;  Laterality: N/A;  . HERNIA REPAIR    . HERNIA REPAIR    . PERIPHERAL VASCULAR CATHETERIZATION N/A 08/23/2015   Procedure: Dialysis/Perma Catheter Insertion;  Surgeon: Algernon Huxley, MD;  Location: [place] [facility];  Service: Cardiovascular;  Laterality: N/A;  . PERIPHERAL VASCULAR CATHETERIZATION N/A 11/09/2015   Procedure: Dialysis/Perma Catheter Removal;  Surgeon: Katha Cabal, MD;  Location: [place] [facility];  Service: Cardiovascular;  Laterality: N/A;  . PERIPHERAL VASCULAR CATHETERIZATION Left 04/23/2016   Procedure: A/V Fistulagram;  Surgeon: Algernon Huxley, MD;  Location: [place] [facility];  Service: Cardiovascular;  Laterality: Left;  . PORT A CATH REVISION      Family History  Problem Relation Age of Onset  . Diabetes Mother   . Lumbar disc disease Mother   . Migraines Mother   . Hypertension Mother   . Cancer - Prostate Father        mild  . Diabetes Father   . Hypertension Father   . Breast cancer Neg Hx   . Ovarian cancer Neg Hx   . Colon cancer Neg Hx   . Heart disease Neg Hx   . Kidney cancer Neg Hx   . Bladder Cancer Neg Hx     Allergies  Allergen Reactions  . Cinnamon Other (See Comments)    Raises blood pressure  . Procrit [Epoetin Alfa-Epbx] Other (See Comments)    Seizures, grand mal  . Ace Inhibitors Cough  . Azithromycin Other (See Comments)    seizures  . Iron Other (See Comments)    Can not take in IV Form - causes seizures   . Neomycin-Bacitracin Zn-Polymyx Other (See Comments)    Unknown  . Other   . Sulfa Antibiotics Other (See Comments)    Due to risks for seizures.    CBC Latest Ref Rng & Units 09/14/2020 01/29/2019 01/24/2019  WBC 4.0 - 10.5 K/uL 10.6(H) 10.3 9.8  Hemoglobin  12.0 - 15.0 g/dL 10.6(L) 8.6(L) 10.2(L)  Hematocrit 36.0 - 46.0 % 32.9(L) 26.8(L) 32.4(L)  Platelets 150 - 400 K/uL 142(L) 163 125(L)      CMP     Component Value Date/Time   NA 139 09/14/2020 1956   NA 142 03/08/2014 1634   NA 141 07/04/2011 0747   K 4.0 09/14/2020 1956   K 4.6 07/04/2011 0747   CL 98 09/14/2020 1956   CL 109 (H) 07/04/2011 0747   CO2 25 09/14/2020 1956   CO2 20 (L) 07/04/2011 0747   GLUCOSE 208 (H) 09/14/2020 1956   GLUCOSE 198 (H) 07/04/2011 0747   BUN 53 (H) 09/14/2020 1956   BUN 82 (HH) 03/08/2014 1634   BUN 65 (H) 07/04/2011 0747   CREATININE 5.92 (H) 09/14/2020 1956   CREATININE 1.95 (H) 07/04/2011 0747   CALCIUM 9.3 09/14/2020 1956   CALCIUM 8.6 07/04/2011 0747   PROT 6.8 09/14/2020 2218   PROT 6.8 03/08/2014 1634   PROT 7.3 07/04/2011 0747   ALBUMIN 3.9 09/14/2020 2218   ALBUMIN 4.4 03/08/2014 1634   ALBUMIN 3.8 07/04/2011 0747   AST 23 09/14/2020 2218   AST 11 (L) 07/04/2011 0747   ALT 19 09/14/2020 2218   ALT 14 07/04/2011 0747   ALKPHOS 107 09/14/2020 2218   ALKPHOS 188 (H) 07/04/2011 0747   BILITOT 0.7 09/14/2020 2218   BILITOT 0.2 07/04/2011 0747   GFRNONAA 8 (L) 09/14/2020 1956   GFRNONAA 29 (L) 07/04/2011 0747   GFRAA 15 (L) 01/29/2019 1431   GFRAA 35 (L) 07/04/2011 0747     No results found.     Assessment & Plan:   1. ESRD on dialysis [facility]) Recommend:  The patient is doing well and currently has adequate dialysis access. The patient's dialysis  center does report some issues however per the patient it seems to be episodic.  Overall her dialysis quality has not decreased.  The patient will follow-up with me in the office in approximately 3 months witho an HDA   2. Type 2 diabetes mellitus with chronic kidney disease on chronic dialysis, unspecified whether long term insulin use ([place]) Continue hypoglycemic medications as already ordered, these medications have been reviewed and there are no changes at this time.  Hgb A1C  to be monitored as already arranged by primary service   3. Benign essential HTN Continue antihypertensive medications as already ordered, these medications have been reviewed and there are no changes at this time.    Current Outpatient Medications on File Prior to Visit  Medication Sig Dispense Refill  . b complex vitamins tablet Take 1 tablet by mouth at bedtime.     . B Complex-C-Folic Acid (DIALYVITE TABLET) TABS Take 1 tablet by mouth daily.  12  . benzonatate (TESSALON) 200 MG capsule Take by mouth.    . Continuous Blood Gluc Sensor (DEXCOM G6 SENSOR) MISC Use 1 every 10 days    . Continuous Blood Gluc Transmit (DEXCOM G6 TRANSMITTER) MISC USE EVERY 3 MONTHS    . epoetin alfa (EPOGEN,PROCRIT) 3000 UNIT/ML injection Inject 3,000 Units into the vein. Every tues, thur and sat with dialysis    . famotidine (PEPCID) 40 MG tablet SMARTSIG:1 Tablet(s) By Mouth Every Evening    . Febuxostat 80 MG TABS Take 80 mg by mouth daily.     . fexofenadine (ALLEGRA) 180 MG tablet Take 180 mg by mouth daily.     . Fluticasone-Salmeterol (ADVAIR) 250-50 MCG/DOSE AEPB Inhale 1 puff into the lungs 2 (two) times daily.    . furosemide (LASIX) 20 MG tablet Take 30 mg by mouth 2 (two) times daily.     . hydrALAZINE (APRESOLINE) 100 MG tablet Take 50 mg by mouth See admin instructions. 50 bid except on dyalisis days no am does on dyalisis days    . HYDROcodone-acetaminophen (NORCO/VICODIN) 5-325 MG tablet Take 1 tablet by mouth every 6 (six) hours as needed.     . insulin glargine (LANTUS) 100 UNIT/ML injection Inject 22 Units into the skin at bedtime. 15 units on dialysis days 20 units on others    . insulin lispro (HUMALOG) 100 UNIT/ML injection Inject 8-15 Units into the skin 3 (three) times daily with meals. Per sliding scale    . LAMICTAL 200 MG tablet TAKE 1 AND 1/2 TABLETS (300 MG TOTAL) BY MOUTH 2 (TWO) TIMES DAILY. 270 tablet 1  . levETIRAcetam (KEPPRA) 250 MG tablet Take Bid on non -dialysis days, 3 a  day on hemodialysis days. 270 tablet 5  . levothyroxine (SYNTHROID) 150 MCG tablet Take 150 mcg by mouth daily before breakfast.     . lidocaine-prilocaine (EMLA) cream Apply topically as directed.    . metoprolol succinate (TOPROL XL) 50 MG 24 hr tablet Take 1 tablet (50 mg total) by mouth 2 (two) times daily. Take with or immediately following a meal. (Patient taking differently: Take 75 mg by mouth 2 (two) times daily.) 60 tablet 11  . nystatin-triamcinolone (MYCOLOG II) cream Apply 1 application topically 2 (two) times daily. (Patient taking differently: Apply 1 application topically daily as needed (rash).) 30 g 1  . pantoprazole (PROTONIX) 40 MG tablet Take 40 mg by mouth every morning.     . Probiotic Product (ALIGN) 4 MG CAPS Take 4 mg by mouth daily.     Marland Kitchen  sevelamer carbonate (RENVELA) 800 MG tablet Take 3,200 mg by mouth daily.    Marland Kitchen triamcinolone (NASACORT) 55 MCG/ACT AERO nasal inhaler Place 2 sprays into the nose at bedtime.     Marland Kitchen UNIFINE PENTIPS 32G X 4 MM MISC USE UP TO 5 TIMES DAILY AS DIRECTED  3   No current facility-administered medications on file prior to visit.    There are no Patient Instructions on file for this visit. No follow-ups on file.   Kris Hartmann, NP

## 2020-12-28 ENCOUNTER — Ambulatory Visit
Admission: RE | Admit: 2020-12-28 | Discharge: 2020-12-28 | Disposition: A | Discharge status: Home / Self Care | Payer: Medicare Other | Source: Ambulatory Visit | Attending: Physician Assistant | Admitting: Physician Assistant

## 2020-12-28 ENCOUNTER — Other Ambulatory Visit: Payer: Self-pay | Admitting: Physician Assistant

## 2020-12-28 ENCOUNTER — Other Ambulatory Visit: Payer: Self-pay

## 2020-12-28 DIAGNOSIS — M79605 Pain in left leg: Secondary | ICD-10-CM | POA: Diagnosis present

## 2020-12-31 ENCOUNTER — Other Ambulatory Visit: Payer: Self-pay | Admitting: Physician Assistant

## 2020-12-31 ENCOUNTER — Other Ambulatory Visit: Payer: Self-pay

## 2020-12-31 ENCOUNTER — Ambulatory Visit
Admission: RE | Admit: 2020-12-31 | Discharge: 2020-12-31 | Disposition: A | Discharge status: Home / Self Care | Payer: Medicare Other | Source: Ambulatory Visit | Attending: Physician Assistant | Admitting: Physician Assistant

## 2020-12-31 DIAGNOSIS — R1032 Left lower quadrant pain: Secondary | ICD-10-CM

## 2021-01-15 ENCOUNTER — Other Ambulatory Visit (INDEPENDENT_AMBULATORY_CARE_PROVIDER_SITE_OTHER): Payer: Self-pay | Admitting: Nurse Practitioner

## 2021-01-15 DIAGNOSIS — Z992 Dependence on renal dialysis: Secondary | ICD-10-CM

## 2021-01-15 DIAGNOSIS — N186 End stage renal disease: Secondary | ICD-10-CM

## 2021-01-17 ENCOUNTER — Other Ambulatory Visit: Payer: Self-pay | Admitting: Neurology

## 2021-01-18 ENCOUNTER — Other Ambulatory Visit: Payer: Self-pay

## 2021-01-18 ENCOUNTER — Encounter (INDEPENDENT_AMBULATORY_CARE_PROVIDER_SITE_OTHER): Payer: Self-pay | Admitting: Vascular Surgery

## 2021-01-18 ENCOUNTER — Ambulatory Visit (INDEPENDENT_AMBULATORY_CARE_PROVIDER_SITE_OTHER): Payer: Medicare Other

## 2021-01-18 ENCOUNTER — Ambulatory Visit (INDEPENDENT_AMBULATORY_CARE_PROVIDER_SITE_OTHER): Payer: Medicare Other | Admitting: Vascular Surgery

## 2021-01-18 VITALS — BP 169/72 | HR 68 | Ht 62.0 in | Wt 206.0 lb

## 2021-01-18 DIAGNOSIS — Z992 Dependence on renal dialysis: Secondary | ICD-10-CM | POA: Diagnosis not present

## 2021-01-18 DIAGNOSIS — I10 Essential (primary) hypertension: Secondary | ICD-10-CM

## 2021-01-18 DIAGNOSIS — E1122 Type 2 diabetes mellitus with diabetic chronic kidney disease: Secondary | ICD-10-CM | POA: Diagnosis not present

## 2021-01-18 DIAGNOSIS — N186 End stage renal disease: Secondary | ICD-10-CM

## 2021-01-18 NOTE — Progress Notes (Signed)
MRN : [id number]  Nina Johnston is a 60 y.o. (1960/10/07) female who presents with chief complaint of  Chief Complaint  Patient presents with   Follow-up    6 Mo [place]  .  History of Present Illness: Patient returns today in follow up of her left radiocephalic AV fistula.  She has been using this now for about 5 years.  It is working well for dialysis.  They are actually doing a really good job of rotating her stick sites and it has not become markedly aneurysmal.  Duplex today shows a widely patent AV fistula with a flow volume of over 1000 mL/min.  Current Outpatient Medications  Medication Sig Dispense Refill   amLODipine (NORVASC) 5 MG tablet Take 5 mg by mouth daily.     b complex vitamins tablet Take 1 tablet by mouth at bedtime.      B Complex-C-Folic Acid (DIALYVITE TABLET) TABS Take 1 tablet by mouth daily.  12   benzonatate (TESSALON) 200 MG capsule Take by mouth.     clonazePAM (KLONOPIN) 1 MG tablet Take 1-2 mg by mouth daily.     Continuous Blood Gluc Sensor (DEXCOM G6 SENSOR) MISC Use 1 every 10 days     Continuous Blood Gluc Transmit (DEXCOM G6 TRANSMITTER) MISC USE EVERY 3 MONTHS     dicyclomine (BENTYL) 20 MG tablet Take 20 mg by mouth 2 (two) times daily.     epoetin alfa (EPOGEN,PROCRIT) 3000 UNIT/ML injection Inject 3,000 Units into the vein. Every tues, thur and sat with dialysis     ethosuximide (ZARONTIN) 250 MG/5ML solution Take 250 mg by mouth 2 (two) times daily.     famotidine (PEPCID) 40 MG tablet SMARTSIG:1 Tablet(s) By Mouth Every Evening     Febuxostat 80 MG TABS Take 80 mg by mouth daily.      fexofenadine (ALLEGRA) 180 MG tablet Take 180 mg by mouth daily.      Fluticasone-Salmeterol (ADVAIR) 250-50 MCG/DOSE AEPB Inhale 1 puff into the lungs 2 (two) times daily.     furosemide (LASIX) 20 MG tablet Take 30 mg by mouth 2 (two) times daily.      hydrALAZINE (APRESOLINE) 100 MG tablet Take 50 mg by mouth See admin instructions. 50 bid except on dyalisis  days no am does on dyalisis days     HYDROcodone-acetaminophen (NORCO/VICODIN) 5-325 MG tablet Take 1 tablet by mouth every 6 (six) hours as needed.      insulin glargine (LANTUS) 100 UNIT/ML injection Inject 22 Units into the skin at bedtime. 15 units on dialysis days 20 units on others     insulin lispro (HUMALOG) 100 UNIT/ML injection Inject 8-15 Units into the skin 3 (three) times daily with meals. Per sliding scale     LAMICTAL 200 MG tablet TAKE 1 AND 1/2 TABLETS (300 MG TOTAL) BY MOUTH 2 (TWO) TIMES DAILY. 270 tablet 1   levETIRAcetam (KEPPRA) 250 MG tablet Take Bid on non -dialysis days, 3 a day on hemodialysis days. 270 tablet 5   levothyroxine (SYNTHROID) 150 MCG tablet Take 150 mcg by mouth daily before breakfast.      lidocaine-prilocaine (EMLA) cream Apply topically as directed.     metoprolol succinate (TOPROL XL) 50 MG 24 hr tablet Take 1 tablet (50 mg total) by mouth 2 (two) times daily. Take with or immediately following a meal. (Patient taking differently: Take 75 mg by mouth 2 (two) times daily.) 60 tablet 11   montelukast (SINGULAIR) 10 MG tablet  SMARTSIG:1 Tablet(s) By Mouth Every Evening     nystatin-triamcinolone (MYCOLOG II) cream Apply 1 application topically 2 (two) times daily. (Patient taking differently: Apply 1 application topically daily as needed (rash).) 30 g 1   pantoprazole (PROTONIX) 40 MG tablet Take 40 mg by mouth every morning.      Probiotic Product (ALIGN) 4 MG CAPS Take 4 mg by mouth daily.      sevelamer carbonate (RENVELA) 800 MG tablet Take 3,200 mg by mouth daily.     triamcinolone (NASACORT) 55 MCG/ACT AERO nasal inhaler Place 2 sprays into the nose at bedtime.      UNIFINE PENTIPS 32G X 4 MM MISC USE UP TO 5 TIMES DAILY AS DIRECTED  3   No current facility-administered medications for this visit.    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  On multiple psychotropic medications, and with DM>    Benign essential HTN 12/26/2014   Bipolar disorder ([place])    Bronchitis    Carotid artery narrowing 12/22/2014   Cervical prolapse    CHF (congestive heart failure) (HCC)    CKD (chronic kidney disease)    Concussion 01-14-14   Fall    COPD (chronic obstructive pulmonary disease) ([place])    Coronary artery disease    Depression    Diabetes mellitus without complication ([place])    Dialysis patient ([place])    Diplopia    Dupuytren's contracture of foot    Essential (primary) hypertension 02/12/2015   Family history of thyroid problem    GERD (gastroesophageal reflux disease)    Gout    Heart disease    enlarged because of COPD   Heart murmur    Hepatomegaly    HLD (hyperlipidemia)    Hyperactive airway disease    Hyperkalemia    Hypertension    Hyperthyroidism    Irritable colon    Morbid obesity ([place])    Multiple thyroid nodules    OAB (overactive bladder)    Pernicious anemia    Post menopausal syndrome    Post-concussion syndrome 03/08/2014   Post-traumatic brain syndrome    Prolapsed uterus    PSVT (paroxysmal supraventricular tachycardia) (HCC)    Renal calculus    Renal disease    w/ GFR 29-may be due to diabetes   Seizures ([place])    last seizure 2012. has had spells since with no knowledge. 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    Transient alteration of awareness 11/10/2012   Urinary incontinence with continuous leakage 03/08/2014    Past Surgical History:  Procedure Laterality Date   A/V FISTULAGRAM Left 07/21/2016   Procedure: A/V Fistulagram;  Surgeon: Algernon Huxley, MD;  Location: [facility];  Service: Cardiovascular;  Laterality: Left;   A/V FISTULAGRAM Left 12/24/2016   Procedure: A/V Fistulagram;  Surgeon:  Algernon Huxley, MD;  Location: [facility];  Service: Cardiovascular;  Laterality: Left;   A/V FISTULAGRAM Left 06/29/2017   Procedure: A/V FISTULAGRAM;  Surgeon: Algernon Huxley, MD;  Location: [facility];  Service: Cardiovascular;  Laterality: Left;   A/V FISTULAGRAM Left 03/08/2018   Procedure: A/V FISTULAGRAM;  Surgeon: Algernon Huxley, MD;  Location: [facility];  Service: Cardiovascular;  Laterality: Left;   A/V FISTULAGRAM Left 04/11/2019   Procedure: A/V FISTULAGRAM;  Surgeon: Algernon Huxley, MD;  Location: [facility];  Service: Cardiovascular;  Laterality: Left;   A/V SHUNT INTERVENTION N/A 07/21/2016   Procedure: A/V Shunt Intervention;  Surgeon: Algernon Huxley, MD;  Location: [facility];  Service: Cardiovascular;  Laterality: N/A;   A/V SHUNT INTERVENTION N/A 12/24/2016   Procedure: A/V SHUNT INTERVENTION;  Surgeon: Algernon Huxley, MD;  Location: [facility];  Service: Cardiovascular;  Laterality: N/A;   AVF     CHOLECYSTECTOMY     COLONOSCOPY     COLONOSCOPY WITH PROPOFOL N/A 03/18/2017   Procedure: COLONOSCOPY WITH PROPOFOL;  Surgeon: Manya Silvas, MD;  Location: [facility] ENDOSCOPY;  Service: Endoscopy;  Laterality: N/A;   ENDOMETRIAL BIOPSY     ablation, uterine   ESOPHAGOGASTRODUODENOSCOPY (EGD) WITH PROPOFOL N/A 03/18/2017   Procedure: ESOPHAGOGASTRODUODENOSCOPY (EGD) WITH PROPOFOL;  Surgeon: Manya Silvas, MD;  Location: [facility] ENDOSCOPY;  Service: Endoscopy;  Laterality: N/A;   HERNIA REPAIR     HERNIA REPAIR     PERIPHERAL VASCULAR CATHETERIZATION N/A 08/23/2015   Procedure: Dialysis/Perma Catheter Insertion;  Surgeon: Algernon Huxley, MD;  Location: [place] [facility];  Service: Cardiovascular;  Laterality: N/A;   PERIPHERAL VASCULAR CATHETERIZATION N/A 11/09/2015   Procedure: Dialysis/Perma Catheter Removal;  Surgeon: Katha Cabal, MD;  Location: [facility];  Service: Cardiovascular;  Laterality: N/A;   PERIPHERAL  VASCULAR CATHETERIZATION Left 04/23/2016   Procedure: A/V Fistulagram;  Surgeon: Algernon Huxley, MD;  Location: [facility];  Service: Cardiovascular;  Laterality: Left;   PORT A CATH REVISION       Social History   Tobacco Use   Smoking status: Never   Smokeless tobacco: Never  Vaping Use   Vaping Use: Never used  Substance Use Topics   Alcohol use: No   Drug use: No      Family History  Problem Relation Age of Onset   Diabetes Mother    Lumbar disc disease Mother    Migraines Mother    Hypertension Mother    Cancer - Prostate Father        mild   Diabetes Father    Hypertension Father    Breast cancer Neg Hx    Ovarian cancer Neg Hx    Colon cancer Neg Hx    Heart disease Neg Hx    Kidney cancer Neg Hx    Bladder Cancer Neg Hx      Allergies  Allergen Reactions   Cinnamon Other (See Comments)    Raises blood pressure   Procrit [Epoetin Alfa-Epbx] Other (See Comments)    Seizures, grand mal   Ace Inhibitors Cough   Azithromycin Other (See Comments)    seizures   Iron Other (See Comments)    Can not take in IV Form - causes seizures    Neomycin-Bacitracin Zn-Polymyx Other (See Comments)    Unknown   Other    Sulfa Antibiotics Other (See Comments)    Due to risks for seizures.        REVIEW OF SYSTEMS (Negative unless checked)   Constitutional: '[]'$ Weight loss  '[]'$ Fever  '[]'$ Chills Cardiac: '[]'$ Chest pain   '[]'$ Chest pressure   '[]'$ Palpitations   '[]'$ Shortness of breath when  laying flat   '[]'$ Shortness of breath at rest   '[]'$ Shortness of breath with exertion. Vascular:  '[]'$ Pain in legs with walking   '[]'$ Pain in legs at rest   '[]'$ Pain in legs when laying flat   '[]'$ Claudication   '[]'$ Pain in feet when walking  '[]'$ Pain in feet at rest  '[]'$ Pain in feet when laying flat   '[]'$ History of DVT   '[]'$ Phlebitis   '[x]'$ Swelling in legs   '[]'$ Varicose veins   '[]'$ Non-healing ulcers Pulmonary:   '[]'$ Uses home oxygen   '[]'$ Productive cough   '[]'$ Hemoptysis   '[]'$ Wheeze  '[]'$ COPD   '[]'$ Asthma Neurologic:   '[]'$ Dizziness  '[]'$ Blackouts   '[]'$ Seizures   '[]'$ History of stroke   '[]'$ History of TIA  '[]'$ Aphasia   '[]'$ Temporary blindness   '[]'$ Dysphagia   '[]'$ Weakness or numbness in arms   '[]'$ Weakness or numbness in legs Musculoskeletal:  '[]'$ Arthritis   '[]'$ Joint swelling   '[]'$ Joint pain   '[]'$ Low back pain Hematologic:  '[]'$ Easy bruising  '[x]'$ Easy bleeding   '[]'$ Hypercoagulable state   '[]'$ Anemic   Gastrointestinal:  '[]'$ Blood in stool   '[]'$ Vomiting blood  '[]'$ Gastroesophageal reflux/heartburn   '[]'$ Abdominal pain Genitourinary:  '[x]'$ Chronic kidney disease   '[]'$ Difficult urination  '[]'$ Frequent urination  '[]'$ Burning with urination   '[]'$ Hematuria Skin:  '[]'$ Rashes   '[]'$ Ulcers   '[]'$ Wounds Psychological:  '[]'$ History of anxiety   '[]'$  History of major depression.   Physical Examination  BP (!) 169/72   Pulse 68   Ht '5\' 2"'$  (1.575 m)   Wt 206 lb (93.4 kg)   BMI 37.68 kg/m  Gen:  WD/WN, NAD Head: [place]/AT, No temporalis wasting. Ear/Nose/Throat: Hearing grossly intact, nares w/o erythema or drainage Eyes: Conjunctiva clear. Sclera non-icteric Neck: Supple.  Trachea midline Pulmonary:  Good air movement, no use of accessory muscles.  Cardiac: RRR, no JVD Vascular:  Vessel Right Left  Radial Palpable Palpable       Musculoskeletal: M/S 5/5 throughout.  No deformity or atrophy.  Excellent thrill in the left radiocephalic AV fistula. Neurologic: Sensation grossly intact in extremities.  Symmetrical.  Speech is fluent.  Psychiatric: Judgment intact, Mood & affect appropriate for pt's clinical situation. Dermatologic: No rashes or ulcers noted.  No cellulitis or open wounds.      Labs No results found for this or any previous visit (from the past 2160 hour(s)).  Radiology US Venous Img Lower Unilateral Left (DVT)  Result Date: 12/28/2020 CLINICAL DATA:  Left lower extremity pain. EXAM: LEFT LOWER EXTREMITY VENOUS DOPPLER ULTRASOUND TECHNIQUE: Gray-scale sonography with graded compression, as well as color Doppler and duplex ultrasound were  performed to evaluate the lower extremity deep venous systems from the level of the common femoral vein and including the common femoral, femoral, profunda femoral, popliteal and calf veins including the posterior tibial, peroneal and gastrocnemius veins when visible. The superficial great saphenous vein was also interrogated. Spectral Doppler was utilized to evaluate flow at rest and with distal augmentation maneuvers in the common femoral, femoral and popliteal veins. COMPARISON:  None. FINDINGS: Contralateral Common Femoral Vein: Respiratory phasicity is normal and symmetric with the symptomatic side. No evidence of thrombus. Normal compressibility. Common Femoral Vein: No evidence of thrombus. Normal compressibility, respiratory phasicity and response to augmentation. Saphenofemoral Junction: No evidence of thrombus. Normal compressibility and flow on color Doppler imaging. Profunda Femoral Vein: No evidence of thrombus. Normal compressibility and flow on color Doppler imaging. Femoral Vein: No evidence of thrombus. Normal compressibility, respiratory phasicity and response to augmentation. Popliteal Vein: No evidence of thrombus. Normal compressibility, respiratory  phasicity and response to augmentation. Calf Veins: No evidence of thrombus. Normal compressibility and flow on color Doppler imaging. Superficial Great Saphenous Vein: No evidence of thrombus. Normal compressibility. Venous Reflux:  None. Other Findings: No evidence of superficial thrombophlebitis or abnormal fluid collection. IMPRESSION: No evidence of left lower extremity deep venous thrombosis. Electronically Signed   By: Aletta Edouard M.D.   On: 12/28/2020 17:05   CT RENAL STONE STUDY  Result Date: 12/31/2020 CLINICAL DATA:  Abdominal tightness with bilateral leg pain EXAM: CT ABDOMEN AND PELVIS WITHOUT CONTRAST TECHNIQUE: Multidetector CT imaging of the abdomen and pelvis was performed following the standard protocol without IV contrast.  COMPARISON:  CT 09/14/2020, 01/08/2018 FINDINGS: Lower chest: Lung bases demonstrate no acute consolidation or effusion. Cardiomegaly with coronary vascular calcification. Hepatobiliary: Lobulated liver contour suspicious for cirrhosis. No focal hepatic abnormality. Status post cholecystectomy. No biliary dilatation. Pancreas: Unremarkable. No pancreatic ductal dilatation or surrounding inflammatory changes. Spleen: Normal in size without focal abnormality. Adrenals/Urinary Tract: Right adrenal gland is normal. 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 Atrophic kidneys with numerous intrarenal calcifications. No hydronephrosis. Decompressed urinary bladder Stomach/Bowel: Stomach nonenlarged. No dilated small bowel. Extensive diverticular disease of the colon without acute wall thickening. Negative appendix. Vascular/Lymphatic: Mild aortic atherosclerosis. No aneurysm. No suspicious nodes Reproductive: Uterus and bilateral adnexa are unremarkable. Other: Negative for free air or free fluid. Diffuse subcutaneous edema. Small ventral hernia containing fat. Similar soft tissue nodule within the hernia and within right anterior peritoneal cavity. Musculoskeletal: No acute or suspicious osseous abnormality. IMPRESSION: 1. No CT evidence for acute intra-abdominal or pelvic abnormality. 2. Lobulated liver contour suspect for cirrhosis. 3. Atrophic kidneys with numerous kidney stones but no hydronephrosis 4. Extensive diverticular disease of the colon without acute wall thickening 5. Other chronic findings as described above without significant change. Electronically Signed   By: Donavan Foil M.D.   On: 12/31/2020 17:19    Assessment/Plan Type 2 diabetes mellitus (HCC) blood glucose control important in reducing the progression of atherosclerotic disease. Also, involved in wound healing. On appropriate medications.     Essential (primary) hypertension blood pressure control important in  reducing the progression of atherosclerotic disease. On appropriate oral medications.  ESRD on dialysis [facility]) Duplex today shows a widely patent AV fistula with a flow volume of over 1000 mL/min.  Her fistula is working well.  As long as they continue to rotate her access sites, this fistula should be durable for a long time.  Return to clinic in 1 year.    Leotis Pain, MD  01/18/2021 11:19 AM    This note was created with Dragon medical transcription system.  Any errors from dictation are purely unintentional

## 2021-01-18 NOTE — Assessment & Plan Note (Signed)
Duplex today shows a widely patent AV fistula with a flow volume of over 1000 mL/min.  Her fistula is working well.  As long as they continue to rotate her access sites, this fistula should be durable for a long time.  Return to clinic in 1 year.

## 2021-02-22 ENCOUNTER — Other Ambulatory Visit: Payer: Self-pay | Admitting: Neurology

## 2021-02-22 DIAGNOSIS — R4789 Other speech disturbances: Secondary | ICD-10-CM

## 2021-02-22 DIAGNOSIS — R270 Ataxia, unspecified: Secondary | ICD-10-CM

## 2021-02-25 ENCOUNTER — Other Ambulatory Visit: Payer: Self-pay | Admitting: *Deleted

## 2021-02-25 ENCOUNTER — Telehealth: Payer: Self-pay | Admitting: Neurology

## 2021-02-25 DIAGNOSIS — T8619 Other complication of kidney transplant: Secondary | ICD-10-CM

## 2021-02-25 DIAGNOSIS — R270 Ataxia, unspecified: Secondary | ICD-10-CM

## 2021-02-25 DIAGNOSIS — R4789 Other speech disturbances: Secondary | ICD-10-CM

## 2021-02-25 DIAGNOSIS — R404 Transient alteration of awareness: Secondary | ICD-10-CM

## 2021-02-25 MED ORDER — ETHOSUXIMIDE 250 MG/5ML PO SOLN: ORAL | 3 refills | Status: DC

## 2021-02-25 NOTE — Telephone Encounter (Signed)
Called back and confirmed we received request. I e-scribed refill. She verbalized understanding.

## 2021-02-25 NOTE — Telephone Encounter (Signed)
Pt's mother, Louie Bun called, checking on refill for ethosuximide (ZARONTIN) 250 MG/5ML solution. Would like a call from the nurse to confirm refill has been sent. Do not want her to run out of medication

## 2021-03-09 ENCOUNTER — Other Ambulatory Visit: Payer: Self-pay | Admitting: Neurology

## 2021-03-12 ENCOUNTER — Telehealth: Payer: Self-pay | Admitting: Neurology

## 2021-03-12 NOTE — Telephone Encounter (Signed)
PA completed through [employer] KEY:[id number] Will await determination

## 2021-03-13 NOTE — Telephone Encounter (Signed)
Approved till 03/12/22

## 2021-04-03 ENCOUNTER — Ambulatory Visit: Payer: Medicare Other | Admitting: Neurology

## 2021-04-29 ENCOUNTER — Other Ambulatory Visit: Payer: Self-pay

## 2021-04-29 ENCOUNTER — Encounter: Payer: Self-pay | Admitting: Emergency Medicine

## 2021-04-29 ENCOUNTER — Emergency Department: Payer: Medicare Other

## 2021-04-29 DIAGNOSIS — J449 Chronic obstructive pulmonary disease, unspecified: Secondary | ICD-10-CM | POA: Diagnosis not present

## 2021-04-29 DIAGNOSIS — E1122 Type 2 diabetes mellitus with diabetic chronic kidney disease: Secondary | ICD-10-CM | POA: Insufficient documentation

## 2021-04-29 DIAGNOSIS — Z992 Dependence on renal dialysis: Secondary | ICD-10-CM | POA: Insufficient documentation

## 2021-04-29 DIAGNOSIS — Z79899 Other long term (current) drug therapy: Secondary | ICD-10-CM | POA: Diagnosis not present

## 2021-04-29 DIAGNOSIS — I132 Hypertensive heart and chronic kidney disease with heart failure and with stage 5 chronic kidney disease, or end stage renal disease: Secondary | ICD-10-CM | POA: Insufficient documentation

## 2021-04-29 DIAGNOSIS — R519 Headache, unspecified: Secondary | ICD-10-CM | POA: Insufficient documentation

## 2021-04-29 DIAGNOSIS — Z794 Long term (current) use of insulin: Secondary | ICD-10-CM | POA: Insufficient documentation

## 2021-04-29 DIAGNOSIS — J45909 Unspecified asthma, uncomplicated: Secondary | ICD-10-CM | POA: Diagnosis not present

## 2021-04-29 DIAGNOSIS — I2510 Atherosclerotic heart disease of native coronary artery without angina pectoris: Secondary | ICD-10-CM | POA: Insufficient documentation

## 2021-04-29 DIAGNOSIS — N186 End stage renal disease: Secondary | ICD-10-CM | POA: Insufficient documentation

## 2021-04-29 DIAGNOSIS — Z20822 Contact with and (suspected) exposure to covid-19: Secondary | ICD-10-CM | POA: Diagnosis not present

## 2021-04-29 DIAGNOSIS — R5383 Other fatigue: Secondary | ICD-10-CM | POA: Insufficient documentation

## 2021-04-29 DIAGNOSIS — I509 Heart failure, unspecified: Secondary | ICD-10-CM | POA: Diagnosis not present

## 2021-04-29 LAB — CBC WITH DIFFERENTIAL/PLATELET
Abs Immature Granulocytes: 0.22 10*3/uL — ABNORMAL HIGH (ref 0.00–0.07)
Basophils Absolute: 0.1 10*3/uL (ref 0.0–0.1)
Basophils Relative: 1 %
Eosinophils Absolute: 0.1 10*3/uL (ref 0.0–0.5)
Eosinophils Relative: 1 %
HCT: 29.3 % — ABNORMAL LOW (ref 36.0–46.0)
Hemoglobin: 9.8 g/dL — ABNORMAL LOW (ref 12.0–15.0)
Immature Granulocytes: 3 %
Lymphocytes Relative: 14 %
Lymphs Abs: 1.1 10*3/uL (ref 0.7–4.0)
MCH: 36.2 pg — ABNORMAL HIGH (ref 26.0–34.0)
MCHC: 33.4 g/dL (ref 30.0–36.0)
MCV: 108.1 fL — ABNORMAL HIGH (ref 80.0–100.0)
Monocytes Absolute: 0.5 10*3/uL (ref 0.1–1.0)
Monocytes Relative: 6 %
Neutro Abs: 5.8 10*3/uL (ref 1.7–7.7)
Neutrophils Relative %: 75 %
Platelets: 87 10*3/uL — ABNORMAL LOW (ref 150–400)
RBC: 2.71 MIL/uL — ABNORMAL LOW (ref 3.87–5.11)
RDW: 13.6 % (ref 11.5–15.5)
WBC: 7.7 10*3/uL (ref 4.0–10.5)
nRBC: 0 % (ref 0.0–0.2)

## 2021-04-29 LAB — COMPREHENSIVE METABOLIC PANEL
ALT: 18 U/L (ref 0–44)
AST: 24 U/L (ref 15–41)
Albumin: 4 g/dL (ref 3.5–5.0)
Alkaline Phosphatase: 109 U/L (ref 38–126)
Anion gap: 9 (ref 5–15)
BUN: 15 mg/dL (ref 8–23)
CO2: 32 mmol/L (ref 22–32)
Calcium: 8.8 mg/dL — ABNORMAL LOW (ref 8.9–10.3)
Chloride: 100 mmol/L (ref 98–111)
Creatinine, Ser: 3.62 mg/dL — ABNORMAL HIGH (ref 0.44–1.00)
GFR, Estimated: 14 mL/min — ABNORMAL LOW (ref >60)
Glucose, Bld: 218 mg/dL — ABNORMAL HIGH (ref 70–99)
Potassium: 3.8 mmol/L (ref 3.5–5.1)
Sodium: 141 mmol/L (ref 135–145)
Total Bilirubin: 0.6 mg/dL (ref 0.3–1.2)
Total Protein: 7 g/dL (ref 6.5–8.1)

## 2021-04-29 LAB — CBG MONITORING, ED: Glucose-Capillary: 200 mg/dL — ABNORMAL HIGH (ref 70–99)

## 2021-04-29 NOTE — ED Triage Notes (Signed)
Pt to ED via POV with mother and c/o altered mental status changes and fatigued for the last three days. Mom states that she is normally awake and out going, she has been sleeping more. Pt is on dialysis and a diabetic. She goes to dialysis on M,W, F and she did go today.

## 2021-04-29 NOTE — ED Provider Notes (Signed)
Emergency Medicine Provider Triage Evaluation Note  Nina Johnston , a 60 y.o. female  was evaluated in triage. For the past 3 days she has been confused-worse today. No complaints of feeling ill, but has no appetite which is totally abnormal. Her BP has been elevated higher than usual but MDs have been changing some medications. She is a dialysis patient and had a full session today.  Review of Systems  Positive: Confusion Negative: Pain  Physical Exam  BP (!) 192/66    Pulse 83    Temp 99 F (37.2 C)    Resp (!) 23    Ht 5\' 2"  (1.575 m)    Wt 88.8 kg    SpO2 92%    BMI 35.81 kg/m  Gen:   Awake, no distress   Resp:  Normal effort  MSK:   Moves extremities without difficulty  Other:    Medical Decision Making  Medically screening exam initiated at 6:53 PM.  Appropriate orders placed.  Nina Johnston was informed that the remainder of the evaluation will be completed by another provider, this initial triage assessment does not replace that evaluation, and the importance of remaining in the ED until their evaluation is complete.    Victorino Dike, FNP 04/29/21 1859    Harvest Dark, MD 04/29/21 [phone]

## 2021-04-30 ENCOUNTER — Emergency Department
Admission: EM | Admit: 2021-04-30 | Discharge: 2021-04-30 | Disposition: A | Discharge status: Home / Self Care | Payer: Medicare Other | Attending: Emergency Medicine | Admitting: Emergency Medicine

## 2021-04-30 DIAGNOSIS — R5383 Other fatigue: Secondary | ICD-10-CM

## 2021-04-30 DIAGNOSIS — R404 Transient alteration of awareness: Secondary | ICD-10-CM

## 2021-04-30 LAB — RESP PANEL BY RT-PCR (FLU A&B, COVID) ARPGX2
Influenza A by PCR: NEGATIVE
Influenza B by PCR: NEGATIVE
SARS Coronavirus 2 by RT PCR: NEGATIVE

## 2021-04-30 NOTE — ED Provider Notes (Signed)
[facility] Emergency Department Provider Note  ____________________________________________   Event Date/Time   First MD Initiated Contact with Patient 04/30/21 [phone]     (approximate)  I have reviewed the triage vital signs and the nursing notes.   HISTORY  Chief Complaint Fatigue and Altered Mental Status    HPI Nina Johnston is a 60 y.o. female with extensive medical history as listed below who presents with her mother for evaluation of decreased energy level and some confusion.  The symptoms started acutely yesterday morning.  The patient has extensive medical history that includes seizures but which generally are well controlled.  There was no known or visualized seizure activity, but the patient was confused this morning and engaging in activities such as trying to put on her sock over her shoe and not being able to use her insulin pen which is something with which she is very familiar.  She went to dialysis and had her usual course of treatment today but her mother was concerned about the increased amount that the patient has been sleeping and for apparent confusion.  The patient has been waiting for an extended period of time in the emergency department for evaluation due to overwhelming hospital and ED patient volumes as well as limited staffing.  During that time.  She has returned to baseline and has no complaints at this time.  She has no focal weakness.  She is not complaining of any pain.  She has had no nausea nor vomiting.  She is unaware of most of the symptoms her mother described although she said that she is aware that she has been sleeping more than usual.  Nothing in particular made the symptoms better or worse and she has no complaints currently.  Her mother said that the only other issue is that they have been dealing with elevated blood pressures for more than a month and that there seems to be an issue between her various specialists in  terms of coordinating blood pressure medications, dialysis volume, etc.     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 fistula ([place])    Acute bacterial sinusitis 04/19/2014   Adaptive colitis    Anginal pain ([place])    Anxiety    Anxiety and depression    Arthritis    Asthma    Ataxia 11/10/2012    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  On multiple psychotropic medications, and with DM>    Benign essential HTN 12/26/2014   Bipolar disorder ([place])    Bronchitis    Carotid artery narrowing 12/22/2014   Cervical prolapse    CHF (congestive heart failure) ([place])    CKD (chronic kidney disease)    Concussion 01-14-14   Fall    COPD (chronic obstructive pulmonary disease) ([place])    Coronary artery disease    Depression    Diabetes mellitus without complication ([place])    Dialysis patient ([place])    Diplopia    Dupuytren's contracture of foot    Essential (primary) hypertension 02/12/2015   Family history of thyroid problem    GERD (gastroesophageal reflux disease)    Gout    Heart disease    enlarged because of COPD   Heart murmur    Hepatomegaly    HLD (hyperlipidemia)    Hyperactive airway disease    Hyperkalemia    Hypertension    Hyperthyroidism    Irritable colon    Morbid obesity ([place])    Multiple thyroid  nodules    OAB (overactive bladder)    Pernicious anemia    Post menopausal syndrome    Post-concussion syndrome 03/08/2014   Post-traumatic brain syndrome    Prolapsed uterus    PSVT (paroxysmal supraventricular tachycardia) (HCC)    Renal calculus    Renal disease    w/ GFR 29-may be due to diabetes   Seizures ([place])    last seizure 2012. has had spells since with no knowledge. 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    Transient alteration of awareness 11/10/2012   Urinary incontinence with continuous leakage 03/08/2014    Patient Active Problem List   Diagnosis Date Noted   Atrial flutter  ([place]) 09/19/2020   CKD (chronic kidney disease) stage 5, GFR less than 15 ml/min (HCC) 10/26/2019   Mild aortic valve stenosis 12/27/2018   LVH (left ventricular hypertrophy) due to hypertensive disease, without heart failure 04/14/2018   Partial symptomatic epilepsy with complex partial seizures, not intractable, without status epilepticus ([place]) 04/07/2018   Diabetes mellitus with ESRD (end-stage renal disease) ([place]) 04/07/2018   Ovarian cyst, left 01/15/2018   Spells of decreased attentiveness 12/02/2017   Postdialysis syndrome 12/02/2017   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[place]) 12/02/2017   Chronic superficial gastritis without bleeding 11/18/2017   Hx of adenomatous colonic polyps 11/18/2017   Anemia due to pre-ESRD treated with erythropoietin 09/23/2017   Hemodialysis access, AV graft ([place]) 09/23/2017   ESRD on dialysis ([place]) 02/06/2017   Heme positive stool 01/07/2017   Delay kidney tx func d/t fluid overload requiring acute dialysis ([place]) 12/03/2016   Menopause 10/22/2016   Status post endometrial ablation 10/22/2016   Swelling of limb 04/18/2016   Kidney dialysis as the cause of abnormal reaction of the patient, or of later complication, without mention of misadventure at the time of the procedure (CODE) 04/18/2016   Large liver 02/05/2016   S/P repair of ventral hernia 02/01/2016   End-stage renal disease on hemodialysis ([place]) 08/24/2015   OAB (overactive bladder) 06/07/2015   Incontinence 06/07/2015   Airway hyperreactivity 06/06/2015   Binocular vision disorder with diplopia 06/06/2015   Type 2 diabetes mellitus ([place]) 06/01/2015   Severe diabetic hypoglycemia ([place]) 04/16/2015   Convulsions ([place]) 02/12/2015   Hypoglycemic reaction 02/12/2015   Other symptoms and signs concerning food and fluid intake 02/12/2015   Absence of menstruation 02/12/2015   Absolute anemia 02/12/2015   Post menopausal syndrome 02/12/2015   Calculus of kidney 02/12/2015   Chronic obstructive pulmonary  disease ([place]) 02/12/2015   Bone/cartilage disorder 02/12/2015   Urinary system disease 02/12/2015   Encounter for gynecological examination without abnormal finding 02/12/2015   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   Essential (primary) hypertension 02/12/2015   Gout 02/12/2015   Hernia, internal 02/12/2015   Hypoglycemia 02/12/2015   Adaptive colitis 02/12/2015   Arthritis, degenerative 02/12/2015   Postprocedural state 02/12/2015   Dupuytren's contracture of foot 02/12/2015   Cutaneous eruption 02/12/2015   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 02/12/2015   Absence of bladder continence 02/12/2015   Cervical prolapse 02/12/2015   Seizure ([place]) 02/12/2015   Benign essential HTN 12/26/2014   Carotid artery narrowing 12/22/2014   Chest pain 12/22/2014   Combined fat and carbohydrate induced hyperlipemia 12/22/2014   Bilateral carotid artery stenosis 12/22/2014   Acute bacterial sinusitis 04/19/2014   Anemia in chronic illness 03/16/2014   Urinary incontinence  with continuous leakage 03/08/2014   Post-concussion syndrome 03/08/2014   Total urinary incontinence 03/08/2014   Brain syndrome, 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   Apnea, sleep 01/17/2014   Patient awaiting renal transplant 06/03/2013   Bipolar affective disorder ([place]) 12/16/2012   GERD (gastroesophageal reflux disease) 12/16/2012   Class 2 severe obesity due to excess calories with serious comorbidity and body mass index (BMI) of 37.0 to 37.9 in adult [facility]) 12/16/2012   Obstructive apnea 12/16/2012   Addison anemia 12/16/2012   Seizure disorder ([place]) 12/16/2012   Ataxia 11/10/2012   Spells of speech arrest 11/10/2012   Transient alteration of awareness 11/10/2012   Severe obesity (BMI >= 40) ([place]) 11/10/2012   Morbid obesity ([place]) 11/10/2012   Speech disorder 11/10/2012   Encounter for general adult medical examination without abnormal findings 09/08/2012   Multinodular goiter 01/29/2012    Cardiac murmur 10/29/2011   High potassium 10/28/2011   Hypothyroidism 03/12/2011    Past Surgical History:  Procedure Laterality Date   A/V FISTULAGRAM Left 07/21/2016   Procedure: A/V Fistulagram;  Surgeon: Algernon Huxley, MD;  Location: [place] [facility];  Service: Cardiovascular;  Laterality: Left;   A/V FISTULAGRAM Left 12/24/2016   Procedure: A/V Fistulagram;  Surgeon: Algernon Huxley, MD;  Location: [place] [facility];  Service: Cardiovascular;  Laterality: Left;   A/V FISTULAGRAM Left 06/29/2017   Procedure: A/V FISTULAGRAM;  Surgeon: Algernon Huxley, MD;  Location: [place] [facility];  Service: Cardiovascular;  Laterality: Left;   A/V FISTULAGRAM Left 03/08/2018   Procedure: A/V FISTULAGRAM;  Surgeon: Algernon Huxley, MD;  Location: [place] [facility];  Service: Cardiovascular;  Laterality: Left;   A/V FISTULAGRAM Left 04/11/2019   Procedure: A/V FISTULAGRAM;  Surgeon: Algernon Huxley, MD;  Location: [place] [facility];  Service: Cardiovascular;  Laterality: Left;   A/V SHUNT INTERVENTION N/A 07/21/2016   Procedure: A/V Shunt Intervention;  Surgeon: Algernon Huxley, MD;  Location: [place] [facility];  Service: Cardiovascular;  Laterality: N/A;   A/V SHUNT INTERVENTION N/A 12/24/2016   Procedure: A/V SHUNT INTERVENTION;  Surgeon: Algernon Huxley, MD;  Location: [place] [facility];  Service: Cardiovascular;  Laterality: N/A;   AVF     CHOLECYSTECTOMY     COLONOSCOPY     COLONOSCOPY WITH PROPOFOL N/A 03/18/2017   Procedure: COLONOSCOPY WITH PROPOFOL;  Surgeon: Manya Silvas, MD;  Location: [facility] ENDOSCOPY;  Service: Endoscopy;  Laterality: N/A;   ENDOMETRIAL BIOPSY     ablation, uterine   ESOPHAGOGASTRODUODENOSCOPY (EGD) WITH PROPOFOL N/A 03/18/2017   Procedure: ESOPHAGOGASTRODUODENOSCOPY (EGD) WITH PROPOFOL;  Surgeon: Manya Silvas, MD;  Location: [facility] ENDOSCOPY;  Service: Endoscopy;  Laterality: N/A;   HERNIA REPAIR     HERNIA REPAIR     PERIPHERAL VASCULAR CATHETERIZATION N/A  08/23/2015   Procedure: Dialysis/Perma Catheter Insertion;  Surgeon: Algernon Huxley, MD;  Location: [place] [facility];  Service: Cardiovascular;  Laterality: N/A;   PERIPHERAL VASCULAR CATHETERIZATION N/A 11/09/2015   Procedure: Dialysis/Perma Catheter Removal;  Surgeon: Katha Cabal, MD;  Location: [place] [facility];  Service: Cardiovascular;  Laterality: N/A;   PERIPHERAL VASCULAR CATHETERIZATION Left 04/23/2016   Procedure: A/V Fistulagram;  Surgeon: Algernon Huxley, MD;  Location: [place] [facility];  Service: Cardiovascular;  Laterality: Left;   PORT A CATH REVISION      Prior to Admission medications   Medication Sig Start Date End Date Taking? Authorizing Provider  amLODipine (NORVASC) 5 MG tablet Take 5  mg by mouth daily. 12/17/20   [provider]  b complex vitamins tablet Take 1 tablet by mouth at bedtime.     [provider]  B Complex-C-Folic Acid (DIALYVITE TABLET) TABS Take 1 tablet by mouth daily. 11/14/17   [provider]  benzonatate (TESSALON) 200 MG capsule Take by mouth. 10/30/17   [provider]  clonazePAM (KLONOPIN) 1 MG tablet Take 1-2 mg by mouth daily. 12/17/20   [provider]  Continuous Blood Gluc Sensor (DEXCOM G6 SENSOR) MISC Use 1 every 10 days 07/31/20   [provider]  Continuous Blood Gluc Transmit (DEXCOM G6 TRANSMITTER) MISC USE EVERY 3 MONTHS 11/06/20   [provider]  dicyclomine (BENTYL) 20 MG tablet Take 20 mg by mouth 2 (two) times daily. 12/27/20   [provider]  epoetin alfa (EPOGEN,PROCRIT) 3000 UNIT/ML injection Inject 3,000 Units into the vein. Every tues, thur and sat with dialysis    [provider]  ethosuximide (ZARONTIN) 250 MG/5ML solution Take 250 mg by mouth 2 (two) times daily. 12/13/20   [provider]  ethosuximide (ZARONTIN) 250 MG/5ML solution 5 ml bid 02/25/21   Dohmeier, Asencion Partridge, MD  famotidine (PEPCID) 40 MG tablet SMARTSIG:1 Tablet(s) By  Mouth Every Evening 10/04/20   [provider]  Febuxostat 80 MG TABS Take 80 mg by mouth daily.     [provider]  fexofenadine (ALLEGRA) 180 MG tablet Take 180 mg by mouth daily.     [provider]  Fluticasone-Salmeterol (ADVAIR) 250-50 MCG/DOSE AEPB Inhale 1 puff into the lungs 2 (two) times daily.    [provider]  furosemide (LASIX) 20 MG tablet Take 30 mg by mouth 2 (two) times daily.     [provider]  hydrALAZINE (APRESOLINE) 100 MG tablet Take 50 mg by mouth See admin instructions. 50 bid except on dyalisis days no am does on dyalisis days    [provider]  HYDROcodone-acetaminophen (NORCO/VICODIN) 5-325 MG tablet Take 1 tablet by mouth every 6 (six) hours as needed.  03/17/19   [provider]  insulin glargine (LANTUS) 100 UNIT/ML injection Inject 22 Units into the skin at bedtime. 15 units on dialysis days 20 units on others    [provider]  insulin lispro (HUMALOG) 100 UNIT/ML injection Inject 8-15 Units into the skin 3 (three) times daily with meals. Per sliding scale    [provider]  LAMICTAL 200 MG tablet TAKE 1 AND 1/2 TABLETS (300 MG TOTAL) BY MOUTH 2 (TWO) TIMES DAILY. 03/13/21   Dohmeier, Asencion Partridge, MD  levETIRAcetam (KEPPRA) 250 MG tablet Take Bid on non -dialysis days, 3 a day on hemodialysis days. 09/19/20   Dohmeier, Asencion Partridge, MD  levothyroxine (SYNTHROID) 150 MCG tablet Take 150 mcg by mouth daily before breakfast.     [provider]  lidocaine-prilocaine (EMLA) cream Apply topically as directed. 01/15/20   [provider]  metoprolol succinate (TOPROL XL) 50 MG 24 hr tablet Take 1 tablet (50 mg total) by mouth 2 (two) times daily. Take with or immediately following a meal. Patient taking differently: Take 75 mg by mouth 2 (two) times daily. 04/16/16   Dohmeier, Asencion Partridge, MD  montelukast (SINGULAIR) 10 MG tablet SMARTSIG:1 Tablet(s) By Mouth Every Evening 12/05/20   [provider]  nystatin-triamcinolone (MYCOLOG II) cream Apply 1 application topically 2 (two) times daily. Patient taking differently: Apply 1 application topically daily as needed (rash). 10/22/16   Defrancesco, Alanda Slim, MD  pantoprazole (  PROTONIX) 40 MG tablet Take 40 mg by mouth every morning.     [provider]  Probiotic Product (ALIGN) 4 MG CAPS Take 4 mg by mouth daily.     [provider]  sevelamer carbonate (RENVELA) 800 MG tablet Take 3,200 mg by mouth daily. 11/07/20   [provider]  triamcinolone (NASACORT) 55 MCG/ACT AERO nasal inhaler Place 2 sprays into the nose at bedtime.     [provider]  UNIFINE PENTIPS 32G X 4 MM MISC USE UP TO 5 TIMES DAILY AS DIRECTED 11/22/17   [provider]    Allergies Cinnamon, Procrit [epoetin alfa-epbx], Ace inhibitors, Azithromycin, Iron, Neomycin-bacitracin zn-polymyx, Other, and Sulfa antibiotics  Family History  Problem Relation Age of Onset   Diabetes Mother    Lumbar disc disease Mother    Migraines Mother    Hypertension Mother    Cancer - Prostate Father        mild   Diabetes Father    Hypertension Father    Breast cancer Neg Hx    Ovarian cancer Neg Hx    Colon cancer Neg Hx    Heart disease Neg Hx    Kidney cancer Neg Hx    Bladder Cancer Neg Hx     Social History Social History   Tobacco Use   Smoking status: Never   Smokeless tobacco: Never  Vaping Use   Vaping Use: Never used  Substance Use Topics   Alcohol use: No   Drug use: No    Review of Systems Constitutional: Increased fatigue.  No fever/chills Eyes: No visual changes. ENT: No sore throat. Cardiovascular: Denies chest pain. Respiratory: Denies shortness of breath. Gastrointestinal: No abdominal pain.  No nausea, no vomiting.  No diarrhea.  No constipation. Genitourinary: Negative for dysuria. Musculoskeletal: Negative for neck pain.  Negative for back pain. Integumentary: Negative for  rash. Neurological: Confusion, now resolved.  Negative for headaches, focal weakness or numbness.   ____________________________________________   PHYSICAL EXAM:  VITAL SIGNS: ED Triage Vitals  Enc Vitals Group     BP 04/29/21 1846 (!) 192/66     Pulse Rate 04/29/21 1846 83     Resp 04/29/21 1846 (!) 23     Temp 04/29/21 1846 99 F (37.2 C)     Temp Source 04/30/21 0046 Oral     SpO2 04/29/21 1846 92 %     Weight 04/29/21 1846 88.8 kg (195 lb 12.3 oz)     Height 04/29/21 1846 1.575 m (5\' 2" )     Head Circumference --      Peak Flow --      Pain Score 04/29/21 1857 0     Pain Loc --      Pain Edu? --      Excl. in [place]? --     Constitutional: Alert and oriented.  Eyes: Conjunctivae are normal.  Head: Atraumatic. Nose: No congestion/rhinnorhea. Mouth/Throat: Patient is wearing a mask. Neck: No stridor.  No meningeal signs.   Cardiovascular: Normal rate, regular rhythm. Good peripheral circulation.  Easily palpable thrill in dialysis fistula in left arm. Respiratory: Normal respiratory effort.  No retractions. Gastrointestinal: Soft and nontender. No distention.  Musculoskeletal: No lower extremity tenderness nor edema. No gross deformities of extremities. Neurologic:  Normal speech and language. No gross focal neurologic deficits are appreciated.  Skin:  Skin is warm, dry and intact. Psychiatric: Mood and affect are normal. Speech and behavior are normal.  ____________________________________________   LABS (all labs  ordered are listed, but only abnormal results are displayed)  Labs Reviewed  COMPREHENSIVE METABOLIC PANEL - Abnormal; Notable for the following components:      Result Value   Glucose, Bld 218 (*)    Creatinine, Ser 3.62 (*)    Calcium 8.8 (*)    GFR, Estimated 14 (*)    All other components within normal limits  CBC WITH DIFFERENTIAL/PLATELET - Abnormal; Notable for the following components:   RBC 2.71 (*)    Hemoglobin 9.8 (*)    HCT 29.3 (*)     MCV 108.1 (*)    MCH 36.2 (*)    Platelets 87 (*)    Abs Immature Granulocytes 0.22 (*)    All other components within normal limits  CBG MONITORING, ED - Abnormal; Notable for the following components:   Glucose-Capillary 200 (*)    All other components within normal limits  RESP PANEL BY RT-PCR (FLU A&B, COVID) ARPGX2  URINALYSIS, ROUTINE W REFLEX MICROSCOPIC   ____________________________________________  EKG  ED ECG REPORT I, Hinda Kehr, the attending physician, personally viewed and interpreted this ECG.  Date: 04/29/2021 EKG Time: 80 Rate: Normal sinus rhythm Rhythm: normal sinus rhythm QRS Axis: normal Intervals: normal ST/T Wave abnormalities: Non-specific ST segment / T-wave changes, but no clear evidence of acute ischemia. Narrative Interpretation: no definitive evidence of acute ischemia; does not meet STEMI criteria.  ____________________________________________  RADIOLOGY I, Hinda Kehr, personally viewed and evaluated these images (plain radiographs) as part of my medical decision making, as well as reviewing the written report by the radiologist.  ED MD interpretation: No acute abnormality identified on CT head.  Official radiology report(s): CT Head Wo Contrast  Result Date: 04/29/2021 CLINICAL DATA:  Mental status change of unknown cause. Confusion for 3 days. EXAM: CT HEAD WITHOUT CONTRAST TECHNIQUE: Contiguous axial images were obtained from the base of the skull through the vertex without intravenous contrast. COMPARISON:  05/23/2020 FINDINGS: Brain: Mild diffuse cerebral atrophy. No ventricular dilatation. Patchy low-attenuation changes in the deep white matter consistent with small vessel ischemic change. No mass effect or midline shift. No abnormal extra-axial fluid collection. Gray-white matter junctions are distinct. Basal cisterns are not effaced. No acute intracranial hemorrhage. Vascular: Moderate intracranial vascular calcifications. Skull:  Calvarium appears intact. Sinuses/Orbits: Paranasal sinuses and mastoid air cells are clear. Other: None. IMPRESSION: No acute intracranial abnormalities. Mild diffuse atrophy and small vessel ischemic changes. Electronically Signed   By: Lucienne Capers M.D.   On: 04/29/2021 19:31    ____________________________________________   PROCEDURES   Procedure(s) performed (including Critical Care):  Procedures   ____________________________________________   INITIAL IMPRESSION / MDM / ASSESSMENT AND PLAN / ED COURSE  As part of my medical decision making, I reviewed the following data within the [place] History obtained from family, Nursing notes reviewed and incorporated, Labs reviewed , EKG interpreted , Old chart reviewed, CT reviewed , and Notes from prior ED visits   Differential diagnosis includes, but is not limited to, nonspecific transient alteration of awareness, hypertensive encephalopathy, metabolic encephalopathy, acute infection including viral or bacterial etiology, electrolyte abnormality.  Patient is well-appearing and in no distress.  She is hypertensive but it is extremely variable and that is apparently been the case over the last month.  It is unlikely she is having an acute abnormality such as PRES or hypertensive encephalopathy to explain her symptoms given that this has been an ongoing issue.  I personally reviewed the CT head images and the  radiology report and agree that there is no evidence of an acute abnormality to explain her symptoms.  Similarly her EKG is reassuring, vitals are otherwise reassuring, and her comprehensive metabolic panel and CBC are essentially normal for her (noting that she has end-stage renal disease).  Respiratory viral panel is negative.  Given that she is at baseline with no focal neurological deficits and no acute abnormalities or complaints, I had a long conversation with her and her mother.  I explained that there is  no evidence of an emergent or acute condition at this time and they are both comfortable with the plan for discharge and outpatient follow-up with 1 or more of her regular physicians.  I encouraged close follow-up, continuation of her regular medications and dialysis regimen, and I gave my usual and customary return precautions.           ____________________________________________  FINAL CLINICAL IMPRESSION(S) / ED DIAGNOSES  Final diagnoses:  Transient alteration of awareness  Other fatigue     MEDICATIONS GIVEN DURING THIS VISIT:  Medications - No data to display   ED Discharge Orders     None        Note:  This document was prepared using Dragon voice recognition software and may include unintentional dictation errors.   Hinda Kehr, MD 04/30/21 [phone]

## 2021-04-30 NOTE — Discharge Instructions (Signed)
Your workup in the Emergency Department today was reassuring.  We did not find any specific abnormalities.  We recommend you drink plenty of fluids, take your regular medications and/or any new ones prescribed today, and follow up with the doctor(s) listed in these documents as recommended.  Return to the Emergency Department if you develop new or worsening symptoms that concern you.  

## 2021-04-30 NOTE — ED Notes (Signed)
Tried to give Urine sample but was unable, told patient its okay if she feels the need too later to ask for a hat and try again at a later time.

## 2021-05-09 ENCOUNTER — Encounter: Payer: Self-pay | Admitting: Neurology

## 2021-05-10 ENCOUNTER — Ambulatory Visit (INDEPENDENT_AMBULATORY_CARE_PROVIDER_SITE_OTHER): Payer: Medicare Other | Admitting: Neurology

## 2021-05-10 ENCOUNTER — Other Ambulatory Visit: Payer: Self-pay

## 2021-05-10 VITALS — BP 193/78 | HR 64 | Ht 62.0 in | Wt 200.5 lb

## 2021-05-10 DIAGNOSIS — R4789 Other speech disturbances: Secondary | ICD-10-CM | POA: Diagnosis not present

## 2021-05-10 DIAGNOSIS — Z992 Dependence on renal dialysis: Secondary | ICD-10-CM

## 2021-05-10 DIAGNOSIS — R270 Ataxia, unspecified: Secondary | ICD-10-CM

## 2021-05-10 DIAGNOSIS — T8619 Other complication of kidney transplant: Secondary | ICD-10-CM

## 2021-05-10 DIAGNOSIS — R404 Transient alteration of awareness: Secondary | ICD-10-CM | POA: Diagnosis not present

## 2021-05-10 MED ORDER — LAMOTRIGINE 200 MG PO TABS: ORAL_TABLET | ORAL | 1 refills | Status: DC

## 2021-05-10 MED ORDER — ETHOSUXIMIDE 250 MG/5ML PO SOLN: ORAL | 3 refills | Status: DC

## 2021-05-10 MED ORDER — ETHOSUXIMIDE 250 MG/5ML PO SOLN: 250.0000 mg | Freq: Two times a day (BID) | ORAL | 1 refills | Status: DC

## 2021-05-10 MED ORDER — CLONAZEPAM 1 MG PO TABS: 1.0000 mg | ORAL_TABLET | Freq: Every day | ORAL | 3 refills | Status: DC

## 2021-05-10 NOTE — Progress Notes (Signed)
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 sensation intact to fine touch.  Facial motor strength is symmetric and tongue moves midline.  Motor exam:  reduced tone and normal muscle bulk, obesity related attenuated DTRs but not absent.  , and symmetric normal strength in upper extremities. Grip strength is weaker over the left - due to fistula.  She has her AV access fistula on the left arm. Thrill is palpable and loudly audible.  Her family stated she is using the left arm less.  Has less grip strength. Now with clumsiness more on the right as well. Tremor.  She now showers where she a seat.   Sensory: Fine touch,and vibration were tested in all extremities and are present in toes and feet. Proprioception is tested and normal.  Coordination:  Left arm with AV fistula and thrill- Rapid alternating movements in the fingers/hands is tested - . Finger-to-nose maneuver tested l with left side evidence of ataxia, dysmetria and a new non resting, action  tremor.   Gait and station: Patient walks without assistive device and is very slow -  Appears truncally rigid, with no lumbar rotation, turns with 5 steps, and has reduced step width.  . Stance is stable and  of wider base. Tandem gait is fragmented. She shuffles.    Deep tendon reflexes: in the upper and lower extremities are symmetric and intact.  Babinski maneuver response is down going on the left and equivocal on the right.   Had an ED visit on 09-04-2020;  Again 04-29-2021- ; CT normal brain-     Summary : 35 minute RV.   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   1) spells of reduced awareness and trembling, affecting fine motor skills, handwriting. She demonstrated her handwriting and card shuffling skills here, they have changed.  2) history of seizure like events with  Hypoglycemia, under HD.  3) left arm weakness, partially due to AV fistula and partially due to dislocation after accident.  4) continue Hemodialysis. 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 She has encephalopathic spells, and had seizures after  erythropoetin injections.  Dialysis 3 times a week. creatinine is around 7. 4 hour treatments. Anemia. Hypoglycemia.    Previously maintained  dry weight 91.5 Kg- was after Christmas changed to 90 KG, she was fluid overloaded. 05-10-2021.    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 She has not had a seizure spell since Dec 2021.     Plan :  rule out seizure versus stroke - versus toxic metabolic or medication induced  :  Continue with current medications, I will refill today : we need blood levels.   Keppra , brand name, Ethosuximide/ refilled. Klonopin refilled.   MRI brain , no contrast- CT shows some calcification and no evidence of NPH, rather some atrophy.   EEG repeat    Rv in 8-10 weeks.  Larey Seat, MD   08-15-2019.

## 2021-05-13 NOTE — Progress Notes (Signed)
Rx Zarontin faxed to [facility] at [phone] 05/10/21. Received fax confirmation.

## 2021-05-14 ENCOUNTER — Encounter: Payer: Self-pay | Admitting: Neurology

## 2021-05-14 LAB — LEVETIRACETAM LEVEL: Levetiracetam Lvl: 22 ug/mL (ref 10.0–40.0)

## 2021-05-14 LAB — LAMOTRIGINE LEVEL: Lamotrigine Lvl: 11.9 ug/mL (ref 2.0–20.0)

## 2021-05-14 LAB — ETHOSUXIMIDE LEVEL: Ethosuximide Lvl: 24 ug/mL — ABNORMAL LOW (ref 40–100)

## 2021-05-14 MED ORDER — ETHOSUXIMIDE 250 MG/5ML PO SOLN: ORAL | 12 refills | Status: DC

## 2021-05-14 NOTE — Addendum Note (Signed)
Addended by: Larey Seat on: 05/14/2021 05:18 PM   Modules accepted: Orders

## 2021-05-14 NOTE — Progress Notes (Signed)
Normal ( therapeutic range) blood levels for Keppra, Lamictal and low level for ethosuximide.  If PCP agrees,  I will increase ethosuximide dose to 7.5 ml bid, equivalent to 375 mg bid.

## 2021-05-16 ENCOUNTER — Ambulatory Visit (INDEPENDENT_AMBULATORY_CARE_PROVIDER_SITE_OTHER): Payer: Medicare Other | Admitting: Neurology

## 2021-05-16 ENCOUNTER — Other Ambulatory Visit: Payer: Self-pay

## 2021-05-16 DIAGNOSIS — Z992 Dependence on renal dialysis: Secondary | ICD-10-CM

## 2021-05-16 DIAGNOSIS — R4789 Other speech disturbances: Secondary | ICD-10-CM

## 2021-05-16 DIAGNOSIS — R404 Transient alteration of awareness: Secondary | ICD-10-CM

## 2021-05-16 DIAGNOSIS — R4182 Altered mental status, unspecified: Secondary | ICD-10-CM

## 2021-05-16 DIAGNOSIS — R270 Ataxia, unspecified: Secondary | ICD-10-CM

## 2021-05-21 ENCOUNTER — Telehealth: Payer: Self-pay | Admitting: Neurology

## 2021-05-21 NOTE — Telephone Encounter (Signed)
-----   Message from Larey Seat, MD sent at 05/14/2021  5:17 PM EST ----- Normal ( therapeutic range) blood levels for Keppra, Lamictal and low level for ethosuximide.  If PCP agrees,  I will increase ethosuximide dose to 7.5 ml bid, equivalent to 375 mg bid.

## 2021-05-21 NOTE — Telephone Encounter (Signed)
Advised the mom that pt lab overall looked good. The keppra and lamictal were in normal range. Advised that the ethosuximide level was slightly low. Advised Dr Dohmeier recommends increasing the dose to 7.5 ml BID (375 mg BID) advised a copy of the results will be sent to the primary care MD and we would recommend reaching out to PCP to see if they are opposed to this decision. If not, advised them to let us know and a updated script will be sent in for the pt. Mother verbalized understanding.

## 2021-05-22 NOTE — Procedures (Signed)
° ° °  History:  2 yof with episode of altered mental status   EEG classification: Awake and drowsy  Description of the recording: The background rhythms of this recording consists of a fairly well modulated medium amplitude theta rhythm of 6 Hz. As the record progresses, the patient appears to remain in the waking state throughout the recording. Photic stimulation was performed, did not show any abnormalities. Hyperventilation was also performed, did not show any abnormalities. Toward the end of the recording, the patient enters the drowsy state with slight symmetric slowing seen. The patient never enters stage II sleep. No abnormal epileptiform discharges seen during this recording. There was diffuse slowing present throughout the recording. EKG monitor shows no evidence of cardiac rhythm abnormalities with a heart rate of 72.  Impression: This is an abnormal EEG recording in the waking and drowsy state due to diffuse slowing. Diffuse slowing is consistent with a generalized brain dysfunction such as encephalopathy. No evidence of interictal epileptiform discharges seen.    Alric Ran, MD [facility]

## 2021-05-22 NOTE — Progress Notes (Signed)
Impression: This is an abnormal EEG recording in the waking and drowsy state due to diffuse slowing. Diffuse slowing is consistent with a generalized brain dysfunction such as encephalopathy. No evidence of interictal epileptiform discharges seen.    Alric Ran, MD [facility]

## 2021-05-23 ENCOUNTER — Telehealth: Payer: Self-pay | Admitting: Neurology

## 2021-05-23 NOTE — Telephone Encounter (Signed)
-----   Message from Larey Seat, MD sent at 05/22/2021  4:57 PM EST ----- Impression: This is an abnormal EEG recording in the waking and drowsy state due to diffuse slowing. Diffuse slowing is consistent with a generalized brain dysfunction such as encephalopathy. No evidence of interictal epileptiform discharges seen.    Alric Ran, MD [facility]

## 2021-05-23 NOTE — Telephone Encounter (Signed)
Called the patient mom and reviewed the EEG results. Pt is still waiting to talk to pcp about increase in the medication. They will call us back with that response after discuss with the PCP. She is having a MRI tomorrow and I advised once those results are reviewed we will be in touch with that. Pt's mom verbalized understanding.

## 2021-05-25 ENCOUNTER — Ambulatory Visit
Admission: RE | Admit: 2021-05-25 | Discharge: 2021-05-25 | Disposition: A | Discharge status: Home / Self Care | Payer: Medicare Other | Source: Ambulatory Visit | Attending: Neurology | Admitting: Neurology

## 2021-05-25 ENCOUNTER — Other Ambulatory Visit: Payer: Self-pay

## 2021-05-25 DIAGNOSIS — R4789 Other speech disturbances: Secondary | ICD-10-CM | POA: Diagnosis not present

## 2021-05-25 DIAGNOSIS — Z992 Dependence on renal dialysis: Secondary | ICD-10-CM

## 2021-05-25 DIAGNOSIS — R404 Transient alteration of awareness: Secondary | ICD-10-CM

## 2021-05-25 DIAGNOSIS — R270 Ataxia, unspecified: Secondary | ICD-10-CM

## 2021-05-25 DIAGNOSIS — T8619 Other complication of kidney transplant: Secondary | ICD-10-CM

## 2021-05-27 NOTE — Progress Notes (Signed)
IMPRESSION: This MRI of the brain without contrast shows the following: 1.  Mild generalized cortical atrophy, likely typical for age but progressed compared to the 06/03/2007 MRI.( This is correlated to general health, such as being on hemodialysis, DM, HTN ,etc)  2.  Scattered T2/FLAIR hyperintense foci in the hemispheres consistent with mild chronic microvascular ischemic change.  None of the foci appear to be acute.  This has developed since the 2009 MRI.( This progression is correlated to general health, such as being on hemodialysis, DM, HTN ,etc)   3.   No acute findings- no stroke, no tumor, no scar tissue.

## 2021-05-28 ENCOUNTER — Telehealth: Payer: Self-pay | Admitting: Neurology

## 2021-05-28 NOTE — Telephone Encounter (Signed)
-----   Message from Larey Seat, MD sent at 05/27/2021  5:17 PM EST ----- IMPRESSION: This MRI of the brain without contrast shows the following: 1.  Mild generalized cortical atrophy, likely typical for age but progressed compared to the 06/03/2007 MRI.( This is correlated to general health, such as being on hemodialysis, DM, HTN ,etc)  2.  Scattered T2/FLAIR hyperintense foci in the hemispheres consistent with mild chronic microvascular ischemic change.  None of the foci appear to be acute.  This has developed since the 2009 MRI.( This progression is correlated to general health, such as being on hemodialysis, DM, HTN ,etc)   3.   No acute findings- no stroke, no tumor, no scar tissue.

## 2021-05-28 NOTE — Telephone Encounter (Signed)
Called and spoke with pt and her mother in regards to the MRI brain results. I was able to review the information. Pt verbalized understanding. Pt had no questions at this time but was encouraged to call back if questions arise.

## 2021-05-29 ENCOUNTER — Encounter: Payer: Self-pay | Admitting: Neurology

## 2021-05-29 DIAGNOSIS — R4789 Other speech disturbances: Secondary | ICD-10-CM

## 2021-05-29 DIAGNOSIS — I4892 Unspecified atrial flutter: Secondary | ICD-10-CM

## 2021-05-29 DIAGNOSIS — T8619 Other complication of kidney transplant: Secondary | ICD-10-CM

## 2021-05-29 DIAGNOSIS — G40209 Localization-related (focal) (partial) symptomatic epilepsy and epileptic syndromes with complex partial seizures, not intractable, without status epilepticus: Secondary | ICD-10-CM

## 2021-05-29 DIAGNOSIS — R404 Transient alteration of awareness: Secondary | ICD-10-CM

## 2021-05-29 NOTE — Telephone Encounter (Signed)
Dear Dr. Leonides Schanz and Caren Griffins,  The described incidents are indeed worrisome.  It seems almost encephalopathic, disoriented for a limited time but this can be hours as I understand. I intentionally postpone any kind of seizure related work-up, thinking that it would be very unusual to have memory blips or confusion for such a long time.  Now that the spells have continued I do think I need to repeat an EEG of prolonged duration.   I will order an EEG outpatient which my colleague Dr. April Manson will interpret. 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  It does not have to be a convulsive form.  Myriam Jacobson will contact you with details of the prolonged ambulatory monitoring set up.

## 2021-06-10 ENCOUNTER — Encounter: Payer: Self-pay | Admitting: Neurology

## 2021-06-20 ENCOUNTER — Telehealth: Payer: Self-pay | Admitting: Neurology

## 2021-06-20 NOTE — Telephone Encounter (Signed)
Error

## 2021-06-27 DIAGNOSIS — G40909 Epilepsy, unspecified, not intractable, without status epilepticus: Secondary | ICD-10-CM | POA: Diagnosis not present

## 2021-07-01 ENCOUNTER — Encounter: Payer: Self-pay | Admitting: Neurology

## 2021-07-01 DIAGNOSIS — R4789 Other speech disturbances: Secondary | ICD-10-CM

## 2021-07-01 DIAGNOSIS — G40209 Localization-related (focal) (partial) symptomatic epilepsy and epileptic syndromes with complex partial seizures, not intractable, without status epilepticus: Secondary | ICD-10-CM

## 2021-07-01 DIAGNOSIS — I4892 Unspecified atrial flutter: Secondary | ICD-10-CM

## 2021-07-01 DIAGNOSIS — R404 Transient alteration of awareness: Secondary | ICD-10-CM

## 2021-07-01 DIAGNOSIS — Z992 Dependence on renal dialysis: Secondary | ICD-10-CM

## 2021-07-01 NOTE — Procedures (Signed)
Description 72 Hours Ambulatory Video EEG  TECHNICAL DESCRIPTION: This AVEEG was performed using equipment provided by [employer] utilizing Bluetooth (Trackit) amplifiers with continuous EEGT attended video collection using encrypted remote transmission via [place] secured cellular tower network with data rates for each AVEEG performed. This is a Biomedical engineer AVEEG, obtained, according to the 10-20 international electrode placement system, reformatted digitally into referential and bipolar montages. Data was acquired with a minimum of 21 bipolar connections and sampled at a minimum rate of 250 cycles per second per channel, maximum rate of 450 cycles per second per channel and two channels for EKG. The entire AVEEG study was recorded through cable and or radio telemetry for subsequent analysis. Specified epochs of the AVEEG data were identified at the direction of the subject by the depression of a push button by the patient. Each patient's event file included data acquired two minutes prior to the push button activation and continuing until two minutes afterwards. AVEEG files were reviewed on [place] by [employer] provided [employer] with a digital high frequency filter set at 70 Hz and a low frequency filter set at 0.1 Hz with a paper speed of 30mm/s resulting in 10 seconds per digital page. This entire AVEEG was reviewed by the EEG Technologist. Random time samples, random sleep samples, clips, patient initiated push button files with included patient daily diary logs, EEG Technologist bookmarked note data was reviewed and verified for accuracy and validity by the governing reading neurologist in full details. This AEEGV was fully compliant with all requirements for CPT 97500 for setup, patient education, take down and administered by an EEG technologist.   INTERMITTENT MONITORING WITH VIDEO TECHNICAL DESCRIPTION:  This Long-Term AVEEG  was monitored intermittently by a qualified EEG technologist for the entirety of the recording; quality check-ins were performed at a minimum of every two hours, checking and documenting real-time data and video to assure the integrity and quality of the recording (e.g., camera position, electrode integrity and impedance), and identify the need for maintenance. For intermittent monitoring, an EEG Technologist monitored no more than 12 patients concurrently. Diagnostic video was captured at least 80% of the time during the recording.   TECHNOLOGIST EVENTS: Notes for Slowing were detected during the recording for further evaluation.  PATIENT EVENTS:  The patient pushed the event button 3  times during the AVEEG recording for further evaluation of headaches. 06/25/21 at 1210 for headache on the right side with no changes in EEG background. There was another event on 06/26/21 at 2200 for headaches on the right side of the body with no changes in EEG background.  The last event was on 06/27/21 at 1000 for headaches on the left side of head with no changes in EEG background.   TIME SAMPLES: 1 minute of every hour recorded are reviewed as random time samples.   SLEEP SAMPLES: 5 minutes of every 24 hour recorded sleep cycle are reviewed as random sleep samples.   BACKGROUND EEG: This AVEEG consists of well-modulated bilateral synchronous and symmetrical background in the alpha frequencies in the awake state.  AWAKE: The posterior dominant rhythm was characterized by symmetric and reactive  up to 8 Hz activity with eyes closed.   SLEEP: Stage two sleep displayed Sleep Spindles and Vertex Waveform components. All other sleep stages appeared symmetrical and synchronous with regards to K-Complexes, and REM.  EKG: Normal sinus rhythm.    AVEEG Technical Summary of Findings Impression and Interpretations: This is  an abnormal 72 hrs EEG due to mild intermittent diffuse slowing. No seizure or epileptiform  discharges recorded. There were a total of 3 events consisting of 2 right sided headaches and one left sided headaches with no changes in EEG background.    Alric Ran, MD [facility]

## 2021-07-01 NOTE — Progress Notes (Signed)
Description 72 Hours Ambulatory Video EEG  TECHNICAL DESCRIPTION: This AVEEG was performed using equipment provided by [employer] utilizing Bluetooth  (Trackit) amplifiers with continuous EEGT attended video collection using encrypted remote transmission via [place] secured cellular tower network with data rates for each AVEEG performed. This is a Biomedical engineer AVEEG, obtained, according to the 10-20 international electrode placement system, reformatted digitally into referential and bipolar montages. Data was acquired with a minimum of 21 bipolar connections and sampled at a minimum rate of 250 cycles per second per channel, maximum rate of 450 cycles per second per channel and two channels for EKG. The entire AVEEG study was recorded through cable and or radio telemetry for subsequent analysis. Specified epochs of the AVEEG data were identified at the direction of the subject by the depression of a push button by the patient. Each patient's event file included data acquired two minutes prior to the push button activation and continuing until two minutes afterwards. AVEEG files were reviewed on [place] by [employer] provided [employer] with a digital high frequency filter set at 70 Hz and a low frequency filter set at 0.1 Hz with a paper speed of 81mm/s resulting in 10 seconds per digital page. This entire AVEEG was reviewed by the EEG Technologist. Random time samples, random sleep samples, clips, patient initiated push button files with included patient daily diary logs, EEG Technologist bookmarked note data was reviewed and verified for accuracy and validity by the governing reading neurologist in full details. This AEEGV was fully compliant with all requirements for CPT 97500 for setup, patient education, take down and administered by an EEG technologist.   INTERMITTENT MONITORING WITH VIDEO TECHNICAL DESCRIPTION:  This Long-Term AVEEG  was monitored intermittently by a qualified EEG technologist for the entirety of the recording; quality check-ins were performed at a minimum of every two hours, checking and documenting real-time data and video to assure the integrity and quality of the recording (e.g., camera position, electrode integrity and impedance), and identify the need for maintenance. For intermittent monitoring, an EEG Technologist monitored no more than 12 patients concurrently. Diagnostic video was captured at least 80% of the time during the recording.   TECHNOLOGIST EVENTS: Notes for Slowing were detected during the recording for further evaluation.  PATIENT EVENTS:  The patient pushed the event button 3  times during the AVEEG recording for further evaluation of headaches. 06/25/21 at 1210 for headache on the right side with no changes in EEG background. There was another event on 06/26/21 at 2200 for headaches on the right side of the body with no changes in EEG background.  The last event was on 06/27/21 at 1000 for headaches on the left side of head with no changes in EEG background.   TIME SAMPLES: 1 minute of every hour recorded are reviewed as random time samples.   SLEEP SAMPLES: 5 minutes of every 24 hour recorded sleep cycle are reviewed as random sleep samples.   BACKGROUND EEG: This AVEEG consists of well-modulated bilateral synchronous and symmetrical background in the alpha frequencies in the awake state.  AWAKE: The posterior dominant rhythm was characterized by symmetric and reactive  up to 8 Hz activity with eyes closed.   SLEEP: Stage two sleep displayed Sleep Spindles and Vertex Waveform components. All other sleep stages appeared symmetrical and synchronous with regards to K-Complexes, and REM.  EKG: Normal sinus rhythm.    AVEEG Technical Summary of Findings Impression and Interpretations: This  is an essentially normal 72 hrs EEG. No seizure or epileptiform discharges recorded. There were a total  of 3 events consisting of 2 right sided headaches and one left sided headaches with no changes in EEG background. A normal EEG does not exclude a diagnosis of epilepsy.      Alric Ran, MD [facility]

## 2021-07-02 ENCOUNTER — Telehealth: Payer: Self-pay | Admitting: Neurology

## 2021-07-02 NOTE — Telephone Encounter (Signed)
-----   Message from Larey Seat, MD sent at 07/02/2021 12:52 PM EST ----- AWAKE: The posterior dominant rhythm was characterized by symmetric and reactive  up to 8 Hz activity with eyes closed.   SLEEP: Stage two sleep displayed Sleep Spindles and Vertex Waveform components. All other sleep stages appeared symmetrical and synchronous with regards to K-Complexes, and REM.  EKG: Normal sinus rhythm.    AVEEG Technical Summary of Findings Impression and Interpretations: This is an abnormal 72 hrs EEG due to mild intermittent diffuse slowing. No seizure or epileptiform discharges recorded. There were a total of 3 events consisting of 2 right sided headaches and one left sided headaches with no changes in EEG background.   PS : diffuse slowing is a result of metabolic disorders or medication, kidney disease and dialysis can contribute

## 2021-07-02 NOTE — Telephone Encounter (Signed)
Called the patient and was able to review the findings with her and her mother. Advised there would be no changes to the treatment plan. Informed her that she has a upcoming apt in April at which point we will keep that visit on the books and discuss any other questions that may come up.  Pt has requested a copy be mailed to her for her records. Confrmed the address on file is correct.

## 2021-07-02 NOTE — Progress Notes (Signed)
AWAKE: The posterior dominant rhythm was characterized by symmetric and reactive  up to 8 Hz activity with eyes closed.   SLEEP: Stage two sleep displayed Sleep Spindles and Vertex Waveform components. All other sleep stages appeared symmetrical and synchronous with regards to K-Complexes, and REM.  EKG: Normal sinus rhythm.    AVEEG Technical Summary of Findings Impression and Interpretations: This is an abnormal 72 hrs EEG due to mild intermittent diffuse slowing. No seizure or epileptiform discharges recorded. There were a total of 3 events consisting of 2 right sided headaches and one left sided headaches with no changes in EEG background.   PS : diffuse slowing is a result of metabolic disorders or medication, kidney disease and dialysis can contribute

## 2021-07-02 NOTE — Telephone Encounter (Signed)
Patient here in the lobby today with her dad wanting results of her 72-hour testing - best call back [phone]

## 2021-07-02 NOTE — Telephone Encounter (Signed)
Dr April Manson has reviewed the EEG 72 hr monitoring yesterday but Dr Brett Fairy has not yet reviewed. I will let Dr Brett Fairy know the patient is inquiring about the results and have her review findings. We will contact the patient once the result has been sent to [place].

## 2021-07-18 ENCOUNTER — Emergency Department: Payer: Medicare Other

## 2021-07-18 ENCOUNTER — Encounter: Payer: Self-pay | Admitting: Emergency Medicine

## 2021-07-18 ENCOUNTER — Other Ambulatory Visit: Payer: Self-pay

## 2021-07-18 ENCOUNTER — Emergency Department
Admission: EM | Admit: 2021-07-18 | Discharge: 2021-07-18 | Disposition: A | Discharge status: Home / Self Care | Payer: Medicare Other | Attending: Student in an Organized Health Care Education/Training Program | Admitting: Student in an Organized Health Care Education/Training Program

## 2021-07-18 DIAGNOSIS — W01198A Fall on same level from slipping, tripping and stumbling with subsequent striking against other object, initial encounter: Secondary | ICD-10-CM | POA: Insufficient documentation

## 2021-07-18 DIAGNOSIS — M25572 Pain in left ankle and joints of left foot: Secondary | ICD-10-CM | POA: Insufficient documentation

## 2021-07-18 DIAGNOSIS — M25562 Pain in left knee: Secondary | ICD-10-CM | POA: Diagnosis not present

## 2021-07-18 DIAGNOSIS — I120 Hypertensive chronic kidney disease with stage 5 chronic kidney disease or end stage renal disease: Secondary | ICD-10-CM | POA: Diagnosis not present

## 2021-07-18 DIAGNOSIS — E039 Hypothyroidism, unspecified: Secondary | ICD-10-CM | POA: Insufficient documentation

## 2021-07-18 DIAGNOSIS — M25571 Pain in right ankle and joints of right foot: Secondary | ICD-10-CM | POA: Diagnosis not present

## 2021-07-18 DIAGNOSIS — M25561 Pain in right knee: Secondary | ICD-10-CM | POA: Insufficient documentation

## 2021-07-18 DIAGNOSIS — N186 End stage renal disease: Secondary | ICD-10-CM | POA: Diagnosis not present

## 2021-07-18 DIAGNOSIS — R102 Pelvic and perineal pain: Secondary | ICD-10-CM | POA: Insufficient documentation

## 2021-07-18 DIAGNOSIS — M546 Pain in thoracic spine: Secondary | ICD-10-CM | POA: Diagnosis not present

## 2021-07-18 DIAGNOSIS — M545 Low back pain, unspecified: Secondary | ICD-10-CM | POA: Diagnosis present

## 2021-07-18 DIAGNOSIS — E1122 Type 2 diabetes mellitus with diabetic chronic kidney disease: Secondary | ICD-10-CM | POA: Diagnosis not present

## 2021-07-18 LAB — CBG MONITORING, ED
Glucose-Capillary: 70 mg/dL (ref 70–99)
Glucose-Capillary: 75 mg/dL (ref 70–99)

## 2021-07-18 MED ORDER — IBUPROFEN 600 MG PO TABS
600.0000 mg | ORAL_TABLET | Freq: Once | ORAL | Status: AC
  Administered 2021-07-18: 600 mg via ORAL
  Filled 2021-07-18: qty 1

## 2021-07-18 NOTE — Discharge Instructions (Signed)
-  Treat pain with Tylenol/ibuprofen as needed. ?-Follow-up with your primary care provider as needed. ?-Return to the emergency department anytime if you begin to experience any new or worsening symptoms. ?

## 2021-07-18 NOTE — ED Provider Notes (Signed)
, ? ?[facility] ?Provider Note ? ? ? Event Date/Time  ? First MD Initiated Contact with Patient 07/18/21 1313   ?  (approximate) ? ? ?History  ? ?Chief Complaint ?Fall ? ? ?HPI ?Nina Johnston is a 61 y.o. female, history of ataxia, diabetes, bipolar affective disorder, hypertension, hypothyroidism, epilepsy, ESRD, atrial flutter, presents to the emergency department for evaluation of injury sustained from fall.  Patient states that she was trying to sit down on a chair when she missed and accidentally fell on the floor.  She states that her legs twisted and abnormal directions.  She is currently endorsing pain in her pelvis, knees, ankles, and spine.  She reports some abdominal discomfort that is since resolved.  Denies fever/chills, head injury, LOC, visual deficits, nausea/vomiting, lethargy/fatigue, prodromal symptoms, or neck pain. ? ?History Limitations: No limitations. ? ?  ? ? ?Physical Exam  ?Triage Vital Signs: ?ED Triage Vitals  ?Enc Vitals Group  ?   BP 07/18/21 1256 (!) 167/67  ?   Pulse Rate 07/18/21 1256 67  ?   Resp 07/18/21 1256 18  ?   Temp 07/18/21 1256 98 ?F (36.7 ?C)  ?   Temp Source 07/18/21 1256 Oral  ?   SpO2 07/18/21 1256 97 %  ?   Weight 07/18/21 1257 196 lb (88.9 kg)  ?   Height 07/18/21 1257 5\' 2"  (1.575 m)  ?   Head Circumference --   ?   Peak Flow --   ?   Pain Score 07/18/21 1257 5  ?   Pain Loc --   ?   Pain Edu? --   ?   Excl. in [place]? --   ? ? ?Most recent vital signs: ?Vitals:  ? 07/18/21 1256  ?BP: (!) 167/67  ?Pulse: 67  ?Resp: 18  ?Temp: 98 ?F (36.7 ?C)  ?SpO2: 97%  ? ? ?General: Awake, NAD.  ?Skin: Warm, dry.  ?CV: Good peripheral perfusion.  ?Resp: Normal effort.  Lung sounds clear bilaterally in the apices and bases. ?Abd: Soft, non-tender. No distention.  ?Neuro: At baseline. No gross neurological deficits.  Cranial nerves II through XII intact.  5/5 s strength in upper and lower extremities.   ?Other: No gross deformities.  Patient maintains normal  range of motion in upper and lower extremities, though does seem to endorse significant pain whenever palpating the knees, ankles, and all along her pelvis, thoracic, and lumbar spine.  Patient is still ambulatory and can walk without assistance. ? ?Physical Exam ? ? ? ?ED Results / Procedures / Treatments  ?Labs ?(all labs ordered are listed, but only abnormal results are displayed) ?Labs Reviewed  ?CBG MONITORING, ED  ?CBG MONITORING, ED  ? ? ? ?EKG ?Not applicable. ? ? ?RADIOLOGY ? ?ED Provider Interpretation: I personally reviewed all of these images.  No evidence of acute fracture on any radiographs based on my interpretation. ? ?DG Thoracic Spine 2 View ? ?Result Date: 07/18/2021 ?CLINICAL DATA:  Back pain EXAM: THORACIC SPINE 2 VIEWS COMPARISON:  Thoracic spine radiograph 01/24/2019 FINDINGS: There is no radiographically evident thoracic spine fracture. No static listhesis. There are mild multilevel degenerative changes. IMPRESSION: No evidence of thoracic spine fracture. Mild multilevel degenerative changes. Electronically Signed   By: Maurine Simmering M.D.   On: 07/18/2021 15:39  ? ?DG Lumbar Spine Complete ? ?Result Date: 07/18/2021 ?CLINICAL DATA:  Back pain EXAM: LUMBAR SPINE - COMPLETE 4+ VIEW COMPARISON:  CT 12/31/2020 FINDINGS: There are 5 non-rib-bearing  lumbar vertebrae. Normal alignment. Rudimentary disc at S1-S2. There is no evidence of acute lumbar spine fracture. Preserved disc heights. There is moderate to severe lower lumbar predominant facet arthropathy. IMPRESSION: No evidence of acute lumbar spine fracture or malalignment. Moderate-severe lower lumbar predominant facet arthropathy. Preserved disc heights. Electronically Signed   By: Maurine Simmering M.D.   On: 07/18/2021 15:37  ? ?DG Pelvis 1-2 Views ? ?Result Date: 07/18/2021 ?CLINICAL DATA:  Fall, pain EXAM: PELVIS - 1-2 VIEW COMPARISON:  CT 12/31/2020 FINDINGS: There is no evidence of pelvic fracture or diastasis. No pelvic bone lesions are seen.  Minimal bilateral hip degenerative changes. L4-L5 and L5-S1 facet arthropathy. Vascular calcifications. IMPRESSION: Negative single frontal view of the pelvis for acute fracture. Electronically Signed   By: Maurine Simmering M.D.   On: 07/18/2021 15:41  ? ?DG Ankle Complete Left ? ?Result Date: 07/18/2021 ?CLINICAL DATA:  Fall, pain EXAM: LEFT ANKLE COMPLETE - 3+ VIEW COMPARISON:  None. FINDINGS: There is no evidence of acute fracture or dislocation involving the left ankle. Mild tibiotalar and posterior subtalar osteoarthritis. Prominent plantar and dorsal calcaneal spurring. Sub fibular spurring. IMPRESSION: No evidence of acute left ankle fracture. Electronically Signed   By: Maurine Simmering M.D.   On: 07/18/2021 15:46  ? ?DG Ankle Complete Right ? ?Result Date: 07/18/2021 ?CLINICAL DATA:  Pain EXAM: RIGHT ANKLE - COMPLETE 3+ VIEW COMPARISON:  None. FINDINGS: There is no evidence of acute fracture or dislocation involving the right ankle. There is mild tibiotalar and posterior subtalar osteoarthritis. Sub fibular bony spurring. Plantar and dorsal calcaneal spurring. IMPRESSION: No evidence of acute right ankle fracture. Electronically Signed   By: Maurine Simmering M.D.   On: 07/18/2021 15:45  ? ?DG Knee Complete 4 Views Left ? ?Result Date: 07/18/2021 ?CLINICAL DATA:  Back pain EXAM: LEFT KNEE - COMPLETE 4+ VIEW COMPARISON:  None. FINDINGS: There is no evidence of acute fracture or dislocation. There is tricompartment osteophyte formation with mild medial joint space narrowing. There is no significant joint effusion. Supra and infrapatellar enthesophytes noted. IMPRESSION: No evidence of acute fracture or joint effusion. 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 Electronically Signed   By: Maurine Simmering M.D.   On: 07/18/2021 15:43  ? ?DG Knee Complete 4 Views Right ? ?Result Date: 07/18/2021 ?CLINICAL DATA:  Pain EXAM: RIGHT KNEE - COMPLETE 4+ VIEW COMPARISON:  None. FINDINGS: There is no evidence of acute  fracture or dislocation. No significant joint effusion. There is tricompartment osteophyte formation with moderate medial compartment joint space narrowing. Vascular calcifications. IMPRESSION: No evidence of acute fracture or joint effusion. Tricompartment osteoarthritis, worst in the medial compartment. Electronically Signed   By: Maurine Simmering M.D.   On: 07/18/2021 15:42   ? ?PROCEDURES: ? ?Critical Care performed: None. ? ?Procedures ? ? ? ?MEDICATIONS ORDERED IN ED: ?Medications  ?ibuprofen (ADVIL) tablet 600 mg (600 mg Oral Given 07/18/21 1346)  ? ? ? ?IMPRESSION / MDM / ASSESSMENT AND PLAN / ED COURSE  ?I reviewed the triage vital signs and the nursing notes. ?             ?               ? ? ?Differential diagnosis includes, but is not limited to, ankle fracture, ankle sprain, patellar fracture, knee sprain, pelvic fracture, thoracic/lumbar fracture. ? ?ED Course ?Patient appears well.  Vital signs within normal limits for the patient.  NAD, though is endorsing mild pain at rest.  We  will treat with 6 or milligrams ibuprofen. ? ?All x-rays negative for acute pathology.  See above for details. ? ?Assessment/Plan ?Given the patient's history, physical exam, and work-up, I do not suspect any serious or life-threatening injuries.  Though patient endorsed significant pain on physical exam, there were no remarkable findings on x-ray.  Upon reevaluation, patient appears to be doing well and states that she would like to go home.  She is able to ambulate well on her own without assistance.  Low suspicion for occult injuries requiring advanced imaging.  We will plan to discharge this patient.  Encouraged her to treat pain with Tylenol/ibuprofen as needed.  Both her and her guardian agreed with the plan. ? ?Patient was provided with anticipatory guidance, return precautions, and educational material. Encouraged the patient to return to the emergency department at any time if they begin to experience any new or worsening  symptoms.  ? ?  ? ? ?FINAL CLINICAL IMPRESSION(S) / ED DIAGNOSES  ? ?Final diagnoses:  ?Fall, initial encounter  ? ? ? ?Rx / DC Orders  ? ?ED Discharge Orders   ? ? None  ? ?  ? ? ? ?Note:  This document was prep

## 2021-07-18 NOTE — ED Triage Notes (Signed)
Presents via EMS s/p fall  states she missed a chair  fell hitting bottom   states her legs went in bot directions  having lower back pain and abd discomfort ?

## 2021-07-18 NOTE — ED Notes (Addendum)
Pt took 8 units of humalog at 11 am, 20 units of Lantus at 11 am, 6 units of humalog at 12 noon. Pt bg 75 at present when rechecked.  ?Pt took 4 glucose tablets for bg of 70 when pt was triaged. EDP notified, pt given orange juice, crackers and peanut butter per EDP.  ?

## 2021-08-12 ENCOUNTER — Encounter: Payer: Self-pay | Admitting: Neurology

## 2021-08-13 ENCOUNTER — Encounter: Payer: Self-pay | Admitting: Neurology

## 2021-08-13 ENCOUNTER — Ambulatory Visit (INDEPENDENT_AMBULATORY_CARE_PROVIDER_SITE_OTHER): Payer: Medicare Other | Admitting: Neurology

## 2021-08-13 VITALS — BP 174/77 | HR 67 | Ht 62.0 in | Wt 196.5 lb

## 2021-08-13 DIAGNOSIS — I959 Hypotension, unspecified: Secondary | ICD-10-CM | POA: Insufficient documentation

## 2021-08-13 DIAGNOSIS — R4789 Other speech disturbances: Secondary | ICD-10-CM | POA: Diagnosis not present

## 2021-08-13 DIAGNOSIS — R270 Ataxia, unspecified: Secondary | ICD-10-CM | POA: Diagnosis not present

## 2021-08-13 DIAGNOSIS — R404 Transient alteration of awareness: Secondary | ICD-10-CM

## 2021-08-13 MED ORDER — LEVETIRACETAM 250 MG PO TABS: ORAL_TABLET | ORAL | 5 refills | Status: DC

## 2021-08-13 MED ORDER — LAMOTRIGINE 200 MG PO TABS: ORAL_TABLET | ORAL | 1 refills | Status: DC

## 2021-08-13 NOTE — Patient Instructions (Signed)
Near-Syncope ?Near-syncope is when you suddenly feel like you might pass out or faint. This may also be called presyncope. During an episode of near-syncope, you may: ?Feel dizzy, weak, or light-headed. It may feel like the room is spinning. ?Feel like you may vomit (nauseous). ?See spots or see all white or all black. ?Have cold, clammy skin. ?Feel warm and sweaty. ?Hear ringing in your ears. ?This condition is caused by a sudden decrease in blood flow to the brain. This can result from many causes, but most of those causes are not dangerous. However, near-syncope may be a sign of a serious medical problem, so it is important to seek medical care. ?Follow these instructions at home: ?Medicines ?Take over-the-counter and prescription medicines only as told by your doctor. ?If you are taking blood pressure or heart medicine, get up slowly and spend many minutes getting ready to sit and then stand. This can help with dizziness. ?Lifestyle ?Do not drive, use machinery, or play sports until your doctor says it is okay. ?Do not drink alcohol. ?Do not smoke or use any products that contain nicotine or tobacco. If you need help quitting, ask your doctor. ?Avoid hot tubs and saunas. ?General instructions ?Be aware of any changes in your symptoms. ?Talk with your doctor about your symptoms. You may need to have testing to help find the cause. ?If you start to feel like you might pass out, sit or lie down right away. If sitting, lower your head down between your legs. If lying down, raise (elevate) your feet above the level of your heart. ?Breathe deeply and steadily. Wait until all of the symptoms are gone. ?Have someone stay with you until you feel better. ?Drink enough fluid to keep your pee (urine) pale yellow. ?Avoid standing for a long time. If you must stand for a long time, do movements such as: ?Moving your legs. ?Crossing your legs. ?Flexing and stretching your leg muscles. ?Squatting. ?Keep all follow-up  visits. ?Contact a doctor if: ?You continue to have episodes of near fainting. ?Get help right away if: ?You pass out or faint. ?You have any of these symptoms: ?Fast or uneven heartbeats (palpitations). ?Pain in your chest, belly, or back. ?Shortness of breath. ?You have a seizure. ?You have a very bad headache. ?You are confused. ?You have trouble seeing. ?You are very weak. ?You have trouble walking. ?You are bleeding from your mouth or butt. ?You have black or tarry poop (stool). ?These symptoms may be an emergency. Get help right away. Call your local emergency services (911 in the [place] ?Do not wait to see if the symptoms will go away. ?Do not drive yourself to the hospital. ?Summary ?Near-syncope is when you suddenly feel like you might pass out or faint. ?This condition is caused by a sudden decrease in blood flow to the brain. ?Near-syncope may be a sign of a serious medical problem, so it is important to seek medical care. ?If you start to feel like you might pass out, sit or lie down right away. If sitting, lower your head down between your legs. If lying down, raise (elevate) your feet above the level of your heart. ?Talk with your doctor about your symptoms. You may need to have testing to help find the cause. ?This information is not intended to replace advice given to you by your health care provider. Make sure you discuss any questions you have with your health care provider. ?Document Revised: 08/30/2020 Document Reviewed: 08/30/2020 ?Elsevier Patient Education ? 2022  [employer] ? ?

## 2021-08-13 NOTE — Addendum Note (Signed)
Addended by: Larey Seat on: 08/13/2021 02:10 PM ? ? Modules accepted: Orders ? ?
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
## 2021-08-27 ENCOUNTER — Other Ambulatory Visit: Payer: Self-pay | Admitting: Internal Medicine

## 2021-08-27 DIAGNOSIS — Z1231 Encounter for screening mammogram for malignant neoplasm of breast: Secondary | ICD-10-CM

## 2021-10-06 ENCOUNTER — Other Ambulatory Visit: Payer: Self-pay | Admitting: Neurology

## 2021-10-06 DIAGNOSIS — R4789 Other speech disturbances: Secondary | ICD-10-CM

## 2021-10-06 DIAGNOSIS — R270 Ataxia, unspecified: Secondary | ICD-10-CM

## 2021-10-23 ENCOUNTER — Emergency Department: Payer: Medicare Other

## 2021-10-23 ENCOUNTER — Emergency Department
Admission: EM | Admit: 2021-10-23 | Discharge: 2021-10-23 | Disposition: A | Discharge status: Home / Self Care | Payer: Medicare Other | Attending: Emergency Medicine | Admitting: Emergency Medicine

## 2021-10-23 ENCOUNTER — Other Ambulatory Visit: Payer: Self-pay

## 2021-10-23 DIAGNOSIS — M542 Cervicalgia: Secondary | ICD-10-CM | POA: Diagnosis not present

## 2021-10-23 DIAGNOSIS — M546 Pain in thoracic spine: Secondary | ICD-10-CM | POA: Insufficient documentation

## 2021-10-23 DIAGNOSIS — N186 End stage renal disease: Secondary | ICD-10-CM | POA: Insufficient documentation

## 2021-10-23 DIAGNOSIS — Z992 Dependence on renal dialysis: Secondary | ICD-10-CM | POA: Diagnosis not present

## 2021-10-23 DIAGNOSIS — R1010 Upper abdominal pain, unspecified: Secondary | ICD-10-CM | POA: Insufficient documentation

## 2021-10-23 DIAGNOSIS — E1122 Type 2 diabetes mellitus with diabetic chronic kidney disease: Secondary | ICD-10-CM | POA: Insufficient documentation

## 2021-10-23 DIAGNOSIS — I120 Hypertensive chronic kidney disease with stage 5 chronic kidney disease or end stage renal disease: Secondary | ICD-10-CM | POA: Insufficient documentation

## 2021-10-23 DIAGNOSIS — R0789 Other chest pain: Secondary | ICD-10-CM | POA: Diagnosis present

## 2021-10-23 LAB — CBC
HCT: 34.7 % — ABNORMAL LOW (ref 36.0–46.0)
Hemoglobin: 11.2 g/dL — ABNORMAL LOW (ref 12.0–15.0)
MCH: 34.8 pg — ABNORMAL HIGH (ref 26.0–34.0)
MCHC: 32.3 g/dL (ref 30.0–36.0)
MCV: 107.8 fL — ABNORMAL HIGH (ref 80.0–100.0)
Platelets: 101 10*3/uL — ABNORMAL LOW (ref 150–400)
RBC: 3.22 MIL/uL — ABNORMAL LOW (ref 3.87–5.11)
RDW: 13.8 % (ref 11.5–15.5)
WBC: 11 10*3/uL — ABNORMAL HIGH (ref 4.0–10.5)
nRBC: 0 % (ref 0.0–0.2)

## 2021-10-23 LAB — HEPATIC FUNCTION PANEL
ALT: 18 U/L (ref 0–44)
AST: 26 U/L (ref 15–41)
Albumin: 3.8 g/dL (ref 3.5–5.0)
Alkaline Phosphatase: 122 U/L (ref 38–126)
Bilirubin, Direct: 0.2 mg/dL (ref 0.0–0.2)
Indirect Bilirubin: 0.6 mg/dL (ref 0.3–0.9)
Total Bilirubin: 0.8 mg/dL (ref 0.3–1.2)
Total Protein: 7.1 g/dL (ref 6.5–8.1)

## 2021-10-23 LAB — BASIC METABOLIC PANEL
Anion gap: 8 (ref 5–15)
BUN: 28 mg/dL — ABNORMAL HIGH (ref 8–23)
CO2: 30 mmol/L (ref 22–32)
Calcium: 8.7 mg/dL — ABNORMAL LOW (ref 8.9–10.3)
Chloride: 101 mmol/L (ref 98–111)
Creatinine, Ser: 4.05 mg/dL — ABNORMAL HIGH (ref 0.44–1.00)
GFR, Estimated: 12 mL/min — ABNORMAL LOW (ref >60)
Glucose, Bld: 139 mg/dL — ABNORMAL HIGH (ref 70–99)
Potassium: 4 mmol/L (ref 3.5–5.1)
Sodium: 139 mmol/L (ref 135–145)

## 2021-10-23 LAB — TROPONIN I (HIGH SENSITIVITY)
Troponin I (High Sensitivity): 7 ng/L (ref <18)
Troponin I (High Sensitivity): 7 ng/L (ref <18)

## 2021-10-23 MED ORDER — IOHEXOL 350 MG/ML SOLN
75.0000 mL | Freq: Once | INTRAVENOUS | Status: AC | PRN
  Administered 2021-10-23: 75 mL via INTRAVENOUS

## 2021-10-23 NOTE — ED Notes (Signed)
Pt at CT

## 2021-10-23 NOTE — ED Triage Notes (Signed)
Pt here via [facility] with CP that started 45 mins ago. Pt states pain is centered and left sided and radiates to her left arm and neck. Pt states "it feels like something is sitting on my chest". Pt received 324 ASA and 2 nitro sprays. Pt was at dialysis and only received 3 out of 4 hrs of her tx.    166/70 67 95% 2L

## 2021-10-23 NOTE — ED Notes (Signed)
Pt verbalized understanding discharge instructions and follow-up care instructions. Pt advised if symptoms worsen to return to ED.

## 2021-10-23 NOTE — ED Provider Notes (Signed)
[facility] Provider Note    Event Date/Time   First MD Initiated Contact with Patient 10/23/21 1558     (approximate)   History   Chest Pain   HPI  Nina Johnston is a 61 y.o. female who presents to the ED for evaluation of Chest Pain   I reviewed cardiology clinic visit from 3/21.  Obese patient with history of HTN, seizure disorder, DM. ESRD on iHD.   Patient presents to the ED with her father, a retired psychiatric physician and her caregiver, for evaluation of acute on chronic intermittent chest, back, arm and neck pain.    Patient reports she gets this pain intermittently for a matter of months-years.  Often around 3 AM.  Has not been evaluated emergently for this in the past.  Reports she saw her PCP about yesterday who referred her back to cardiology.  She reports getting dialyzed today routinely when she developed the symptoms while seated.  Reports she still feels them and today is more intense than typical.  She reports an aching pain throughout her chest, upper abdomen, thoracic back, neck, bilateral arms.  Denies any falls or trauma.  Denies any coexisting symptoms.  Denies shortness of breath, cough, nausea, emesis, dizziness, diaphoresis, syncope, vision change, weakness or sensation changes to the extremities.  Physical Exam   Triage Vital Signs: ED Triage Vitals  Enc Vitals Group     BP 10/23/21 1447 (!) 172/68     Pulse Rate 10/23/21 1447 62     Resp 10/23/21 1447 16     Temp 10/23/21 1447 98.4 F (36.9 C)     Temp Source 10/23/21 1447 Oral     SpO2 10/23/21 1447 100 %     Weight 10/23/21 1442 194 lb (88 kg)     Height 10/23/21 1442 5\' 2"  (1.575 m)     Head Circumference --      Peak Flow --      Pain Score 10/23/21 1442 7     Pain Loc --      Pain Edu? --      Excl. in [place]? --     Most recent vital signs: Vitals:   10/23/21 1447 10/23/21 1837  BP: (!) 172/68 (!) 169/61  Pulse: 62 61  Resp: 16 18  Temp: 98.4 F  (36.9 C) 98.2 F (36.8 C)  SpO2: 100% 99%    General: Awake, no distress.  CV:  Good peripheral perfusion.  Resp:  Normal effort.  Abd:  No distention.  MSK:  No deformity noted.  Neuro:  No focal deficits appreciated. Other:     ED Results / Procedures / Treatments   Labs (all labs ordered are listed, but only abnormal results are displayed) Labs Reviewed  BASIC METABOLIC PANEL - Abnormal; Notable for the following components:      Result Value   Glucose, Bld 139 (*)    BUN 28 (*)    Creatinine, Ser 4.05 (*)    Calcium 8.7 (*)    GFR, Estimated 12 (*)    All other components within normal limits  CBC - Abnormal; Notable for the following components:   WBC 11.0 (*)    RBC 3.22 (*)    Hemoglobin 11.2 (*)    HCT 34.7 (*)    MCV 107.8 (*)    MCH 34.8 (*)    Platelets 101 (*)    All other components within normal limits  HEPATIC FUNCTION PANEL  TROPONIN I (  HIGH SENSITIVITY)  TROPONIN I (HIGH SENSITIVITY)    EKG Sinus rhythm, rate of 62 bpm.  Normal axis.  QTc 507 and otherwise normal intervals.  No evidence of acute ischemia.  RADIOLOGY 2 view CXR interpreted by me with cardiomegaly and pulm vascular congestion without pleural effusion or discrete infiltration.  Official radiology report(s): CT Angio Chest/Abd/Pel for Dissection W and/or Wo Contrast  Result Date: 10/23/2021 CLINICAL DATA:  Chest and back pain radiating to neck and arms. EXAM: CT ANGIOGRAPHY CHEST, ABDOMEN AND PELVIS TECHNIQUE: Non-contrast CT of the chest was initially obtained. Multidetector CT imaging through the chest, abdomen and pelvis was performed using the standard protocol during bolus administration of intravenous contrast. Multiplanar reconstructed images and MIPs were obtained and reviewed to evaluate the vascular anatomy. RADIATION DOSE REDUCTION: This exam was performed according to the departmental dose-optimization program which includes automated exposure control, adjustment of the mA  and/or kV according to patient size and/or use of iterative reconstruction technique. CONTRAST:  50mL OMNIPAQUE IOHEXOL 350 MG/ML SOLN COMPARISON:  CT December 31, 2020. FINDINGS: CTA CHEST FINDINGS Cardiovascular: Noncontrast examination reveals aortic atherosclerosis without intramural. Preferential opacification of the thoracic aorta reveals no evidence of thoracic aortic aneurysm or dissection. No central pulmonary embolus on this nondedicated study. Cardiomegaly. Enlarged main pulmonary artery measuring 3.4 cm. No significant pericardial effusion/thickening. Mediastinum/Nodes: Multinodular thyroid with a dominant heterogeneous nodule in the left lobe of the thyroid measuring approximally 3.4 x 3.1 cm. No pathologically enlarged mediastinal, hilar or axillary lymph nodes. The esophagus is grossly unremarkable. Lungs/Pleura: Hypoventilatory change in the dependent lungs. Scattered areas of subsegmental atelectasis versus scarring. No suspicious pulmonary nodules or masses. No pleural effusion. No pneumothorax. Musculoskeletal: Thoracic diffuse idiopathic skeletal hyperostosis. No acute osseous abnormality. Review of the MIP images confirms the above findings. CTA ABDOMEN AND PELVIS FINDINGS VASCULAR Aorta: Normal caliber aorta without aneurysm, dissection, vasculitis or significant stenosis. Celiac: Patent without evidence of aneurysm, dissection, vasculitis or significant stenosis. SMA: Patent without evidence of aneurysm, dissection, vasculitis or significant stenosis. Renals: Both renal arteries are patent without evidence of aneurysm, dissection, vasculitis, fibromuscular dysplasia or significant stenosis. IMA: Patent without evidence of aneurysm, dissection, vasculitis or significant stenosis. Inflow: Patent without evidence of aneurysm, dissection, vasculitis or significant stenosis. Veins: No obvious venous abnormality within the limitations of this arterial phase study. Review of the MIP images confirms the  above findings. NON-VASCULAR Hepatobiliary: Calcified hepatic granuloma. Lobular hepatic contour is suspicious for cirrhosis. Calcified hepatic granuloma. Gallbladder surgically absent. No biliary ductal dilation. Pancreas: No pancreatic ductal dilation or evidence of acute inflammation. Spleen: No splenomegaly. 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 Right adrenal gland appears normal. Atrophic kidneys. No hydronephrosis. Bilateral nonobstructive renal stones measure up to 18 mm on the left and 10 mm on the right. Urinary bladder is nondistended limiting evaluation. Stomach/Bowel: No radiopaque enteric contrast material was administered. Small hiatal hernia otherwise the stomach is unremarkable for degree of distension. No pathologic dilation of small or large bowel. Pancolonic diverticulosis without evidence of acute diverticulitis. Lymphatic: No pathologically enlarged abdominal or pelvic lymph nodes. Reproductive: Status post hysterectomy. No adnexal masses. Other: No significant abdominopelvic free fluid. No pneumoperitoneum. Similar soft tissue nodule along the anterior peritoneum on image 22/11 measuring 2.6 cm, possibly reflecting sequela of prior surgery or infection/inflammation. Similar appearance of the fat containing ventral hernia with a central 12 mm soft tissue nodule within. Musculoskeletal: No acute osseous abnormality. Multilevel degenerative changes spine. Review of the MIP  images confirms the above findings. IMPRESSION: 1. No evidence of aortic dissection, aneurysm or intramural hematoma. No central pulmonary embolus. 2. Cardiomegaly with enlarged main pulmonary artery suggestive of pulmonary artery hypertension. 3. Heterogeneous enlarged multinodular thyroid Recommend nonemergent thyroid US (ref: J Am Coll Radiol. 2015 Feb;12(2): 143-50). 4. Lobular hepatic contour is similar prior and suspicious for cirrhosis. No suspicious hepatic lesion.  5. Atrophic native kidneys with multiple nonobstructive bilateral renal stones. 6. Pancolonic diverticulosis without evidence of acute diverticulitis. 7. Other chronic findings as above are not significantly changed. Electronically Signed   By: Dahlia Bailiff M.D.   On: 10/23/2021 17:07   DG Chest 2 View  Result Date: 10/23/2021 CLINICAL DATA:  61 year old female with chest pain. EXAM: CHEST - 2 VIEW COMPARISON:  Sep 14, 2020. FINDINGS: Cardiomediastinal contours with marked cardiac enlargement accentuated by portable technique. Mild increased interstitial markings and central pulmonary vascular congestion. No lobar consolidation. No pneumothorax.  No sign of pleural effusion. On limited assessment there is no acute skeletal finding. IMPRESSION: Marked cardiac enlargement and central pulmonary vascular congestion with potential mild volume overload. Electronically Signed   By: Zetta Bills M.D.   On: 10/23/2021 15:08    PROCEDURES and INTERVENTIONS:  Procedures  Medications  iohexol (OMNIPAQUE) 350 MG/ML injection 75 mL (75 mLs Intravenous Contrast Given 10/23/21 1635)     IMPRESSION / MDM / ASSESSMENT AND PLAN / ED COURSE  I reviewed the triage vital signs and the nursing notes.  Differential diagnosis includes, but is not limited to, aortic dissection, ACS, PE, volume overload, hyperkalemia  {Patient presents with symptoms of an acute illness or injury that is potentially life-threatening.  61 year old woman on dialysis presents with acute on chronic atypical pains that are quite diffuse and have no coexisting symptoms.  She looks systemically well and has a reassuring examination.  Work-up is quite benign.  Stigmata of ESRD at baseline, 2 negative troponins and chronic microcytic anemia is noted.  CTA aortic study obtained and without evidence of acute pathology thoracoabdominally.  I considered observation admission for this patient, but she is asking about going home which I think is  reasonable considering the atypical and fairly persistent nature of her symptoms.  We discussed the importance of following up with her cardiologist and return precautions for the ED.  Clinical Course as of 10/23/21 1850  Wed Oct 23, 2021  1620 Long discussion regarding plan of care with patient and father.  We discussed possible etiologies of her symptoms.  We discussed my recommendation for aortic angiogram to assess for dissection or other aortic pathology in the setting of her constellation of symptoms.  We discussed the need for dialysis after this, hopefully tomorrow.  They report that they can call the dialysis center and fit her in tomorrow.  They are asking if she will be discharged and requesting this if her studies are normal. [DS]  1840 Reassessed.  Patient reports feeling better.  We discussed reassuring CT.  Discussed work-up overall and mom who is now at the bedside reports that they have already scheduled for dialysis tomorrow.  They are comfortable with her going.  We discussed return precautions. [DS]    Clinical Course User Index [DS] Vladimir Crofts, MD     FINAL CLINICAL IMPRESSION(S) / ED DIAGNOSES   Final diagnoses:  Other chest pain     Rx / DC Orders   ED Discharge Orders     None        Note:  This document  was prepared using Systems analyst and may include unintentional dictation errors.   Vladimir Crofts, MD 10/23/21 [phone]

## 2021-10-23 NOTE — ED Notes (Signed)
Pt states she feels a lot better and her chest pain has subsided.

## 2021-12-03 ENCOUNTER — Ambulatory Visit (INDEPENDENT_AMBULATORY_CARE_PROVIDER_SITE_OTHER): Payer: Medicare Other

## 2021-12-03 ENCOUNTER — Encounter (INDEPENDENT_AMBULATORY_CARE_PROVIDER_SITE_OTHER): Payer: Self-pay | Admitting: Vascular Surgery

## 2021-12-03 ENCOUNTER — Ambulatory Visit (INDEPENDENT_AMBULATORY_CARE_PROVIDER_SITE_OTHER): Payer: Medicare Other | Admitting: Vascular Surgery

## 2021-12-03 VITALS — BP 152/68 | HR 75 | Resp 16 | Wt 195.6 lb

## 2021-12-03 DIAGNOSIS — Z992 Dependence on renal dialysis: Secondary | ICD-10-CM | POA: Diagnosis not present

## 2021-12-03 DIAGNOSIS — E1122 Type 2 diabetes mellitus with diabetic chronic kidney disease: Secondary | ICD-10-CM

## 2021-12-03 DIAGNOSIS — I10 Essential (primary) hypertension: Secondary | ICD-10-CM

## 2021-12-03 DIAGNOSIS — N186 End stage renal disease: Secondary | ICD-10-CM

## 2021-12-03 NOTE — Progress Notes (Signed)
MRN : [id number]  Nina Johnston is a 61 y.o. (June 12, 1960) female who presents with chief complaint of  Chief Complaint  Patient presents with   Follow-up    Ultrasound follow up  .  History of Present Illness: Patient returns today in follow up of her dialysis access.  She [place] that [place] emergency room visit.  She had a couple of mild bleeding episodes since.  No open wounds or infection.  The flow volumes of still being good and they have not had issues with her flows on dialysis. Duplex today show a left radiocephalic AV fistula with a flow volume of 785 mL/min and some narrowing at the AV fistula anastomosis  Current Outpatient Medications  Medication Sig Dispense Refill   amLODipine (NORVASC) 5 MG tablet Take 10 mg by mouth daily.     b complex vitamins tablet Take 1 tablet by mouth at bedtime.      B Complex-C-Folic Acid (DIALYVITE TABLET) TABS Take 1 tablet by mouth daily.  12   benzonatate (TESSALON) 200 MG capsule Take by mouth.     clonazePAM (KLONOPIN) 1 MG tablet Take 1-2 tablets (1-2 mg total) by mouth daily. 90 tablet 3   cloNIDine (CATAPRES - DOSED IN MG/24 HR) 0.2 mg/24hr patch Place 1 patch onto the skin once a week.     Continuous Blood Gluc Sensor (DEXCOM G6 SENSOR) MISC Use 1 every 10 days     Continuous Blood Gluc Transmit (DEXCOM G6 TRANSMITTER) MISC USE EVERY 3 MONTHS     ethosuximide (ZARONTIN) 250 MG/5ML solution TAKE 5 ML BY MOUTH TWICE A DAY 473 mL 3   famotidine (PEPCID) 40 MG tablet SMARTSIG:1 Tablet(s) By Mouth Every Evening     Febuxostat 80 MG TABS Take 80 mg by mouth daily.      fexofenadine (ALLEGRA) 180 MG tablet Take 180 mg by mouth daily.      Fluticasone-Salmeterol (ADVAIR) 250-50 MCG/DOSE AEPB Inhale 1 puff into the lungs 2 (two) times daily.     furosemide (LASIX) 20 MG tablet Take 30 mg by mouth 2 (two) times daily.      hydrALAZINE (APRESOLINE) 50 MG tablet Take 100 mg by mouth 2  (two) times daily.     HYDROcodone-acetaminophen (NORCO/VICODIN) 5-325 MG tablet Take 1 tablet by mouth every 6 (six) hours as needed.      insulin glargine (LANTUS) 100 UNIT/ML injection Inject 22 Units into the skin at bedtime. 16 units on Monday,Wednesday,Friday, 12 units on dialysis days     insulin lispro (HUMALOG) 100 UNIT/ML injection Inject 8-15 Units into the skin 3 (three) times daily with meals. Per sliding scale     lamoTRIgine (LAMICTAL) 200 MG tablet 1.5 tab bid 270 tablet 1   levETIRAcetam (KEPPRA) 250 MG tablet Take Bid on non -dialysis days, 3 a day on hemodialysis days. 270 tablet 5   levothyroxine (SYNTHROID) 150 MCG tablet Take 150 mcg by mouth daily before breakfast.      lidocaine-prilocaine (EMLA) cream Apply topically as directed.     Methoxy PEG-Epoetin Beta (MIRCERA IJ) Inject as directed. Receives w/ dialysis, unsure how often right now     metoprolol succinate (TOPROL XL) 50 MG 24 hr tablet Take 1 tablet (50 mg total) by mouth 2 (two) times daily. Take with or immediately following a meal. (Patient taking differently: Take 75 mg by mouth 2 (two) times daily.) 60 tablet 11   montelukast (  SINGULAIR) 10 MG tablet SMARTSIG:1 Tablet(s) By Mouth Every Evening     nystatin-triamcinolone (MYCOLOG II) cream Apply 1 application topically 2 (two) times daily. (Patient taking differently: Apply 1 application  topically daily as needed (rash).) 30 g 1   pantoprazole (PROTONIX) 40 MG tablet Take 40 mg by mouth every morning.      Probiotic Product (ALIGN) 4 MG CAPS Take 4 mg by mouth daily.      sevelamer carbonate (RENVELA) 800 MG tablet Take 3,200 mg by mouth daily.     triamcinolone (NASACORT) 55 MCG/ACT AERO nasal inhaler Place 2 sprays into the nose at bedtime.      UNIFINE PENTIPS 32G X 4 MM MISC USE UP TO 5 TIMES DAILY AS DIRECTED  3   valsartan (DIOVAN) 160 MG tablet Take 160 mg by mouth 2 (two) times daily.     No current facility-administered medications for this visit.     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  On multiple psychotropic medications, and with DM>    Benign essential HTN 12/26/2014   Bipolar disorder ([place])    Bronchitis    Carotid artery narrowing 12/22/2014   Cervical prolapse    CHF (congestive heart failure) (HCC)    CKD (chronic kidney disease)    Concussion 01-14-14   Fall    COPD (chronic obstructive pulmonary disease) ([place])    Coronary artery disease    Depression    Diabetes mellitus without complication ([place])    Dialysis patient ([place])    Diplopia    Dupuytren's contracture of foot    Essential (primary) hypertension 02/12/2015   Family history of thyroid problem    GERD (gastroesophageal reflux disease)    Gout    Heart disease    enlarged because of COPD   Heart murmur    Hepatomegaly    HLD (hyperlipidemia)    Hyperactive airway disease    Hyperkalemia    Hypertension    Hyperthyroidism    Irritable colon    Morbid obesity ([place])    Multiple thyroid nodules    OAB (overactive bladder)    Pernicious anemia    Post menopausal syndrome    Post-concussion syndrome 03/08/2014   Post-traumatic brain syndrome    Prolapsed uterus    PSVT (paroxysmal supraventricular tachycardia) (HCC)    Renal calculus    Renal disease    w/ GFR 29-may be due to diabetes   Seizures ([place])    last seizure 2012. has had spells since with no knowledge. 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    Transient alteration of awareness 11/10/2012   Urinary incontinence with continuous leakage 03/08/2014    Past Surgical History:  Procedure Laterality Date   A/V FISTULAGRAM Left 07/21/2016   Procedure: A/V Fistulagram;  Surgeon: Algernon Huxley, MD;   Location: [place] [facility];  Service: Cardiovascular;  Laterality: Left;   A/V FISTULAGRAM Left 12/24/2016   Procedure: A/V Fistulagram;  Surgeon: Algernon Huxley, MD;  Location: [place] [facility];  Service: Cardiovascular;  Laterality: Left;   A/V FISTULAGRAM  Left 06/29/2017   Procedure: A/V FISTULAGRAM;  Surgeon: Algernon Huxley, MD;  Location: [place] [facility];  Service: Cardiovascular;  Laterality: Left;   A/V FISTULAGRAM Left 03/08/2018   Procedure: A/V FISTULAGRAM;  Surgeon: Algernon Huxley, MD;  Location: [place] [facility];  Service: Cardiovascular;  Laterality: Left;   A/V FISTULAGRAM Left 04/11/2019   Procedure: A/V FISTULAGRAM;  Surgeon: Algernon Huxley, MD;  Location: [place] [facility];  Service: Cardiovascular;  Laterality: Left;   A/V SHUNT INTERVENTION N/A 07/21/2016   Procedure: A/V Shunt Intervention;  Surgeon: Algernon Huxley, MD;  Location: [place] [facility];  Service: Cardiovascular;  Laterality: N/A;   A/V SHUNT INTERVENTION N/A 12/24/2016   Procedure: A/V SHUNT INTERVENTION;  Surgeon: Algernon Huxley, MD;  Location: [place] [facility];  Service: Cardiovascular;  Laterality: N/A;   AVF     CHOLECYSTECTOMY     COLONOSCOPY     COLONOSCOPY WITH PROPOFOL N/A 03/18/2017   Procedure: COLONOSCOPY WITH PROPOFOL;  Surgeon: Manya Silvas, MD;  Location: [facility];  Service: Endoscopy;  Laterality: N/A;   ENDOMETRIAL BIOPSY     ablation, uterine   ESOPHAGOGASTRODUODENOSCOPY (EGD) WITH PROPOFOL N/A 03/18/2017   Procedure: ESOPHAGOGASTRODUODENOSCOPY (EGD) WITH PROPOFOL;  Surgeon: Manya Silvas, MD;  Location: [facility]COPY;  Service: Endoscopy;  Laterality: N/A;   HERNIA REPAIR     HERNIA REPAIR     PERIPHERAL VASCULAR CATHETERIZATION N/A 08/23/2015   Procedure: Dialysis/Perma Catheter Insertion;  Surgeon: Algernon Huxley, MD;  Location: [place] [facility];  Service: Cardiovascular;  Laterality: N/A;   PERIPHERAL VASCULAR CATHETERIZATION N/A 11/09/2015   Procedure:  Dialysis/Perma Catheter Removal;  Surgeon: Katha Cabal, MD;  Location: [place] [facility];  Service: Cardiovascular;  Laterality: N/A;   PERIPHERAL VASCULAR CATHETERIZATION Left 04/23/2016   Procedure: A/V Fistulagram;  Surgeon: Algernon Huxley, MD;  Location: [place] [facility];  Service: Cardiovascular;  Laterality: Left;   PORT A CATH REVISION       Social History   Tobacco Use   Smoking status: Never   Smokeless tobacco: Never  Vaping Use   Vaping Use: Never used  Substance Use Topics   Alcohol use: No   Drug use: No     Family History  Problem Relation Age of Onset   Diabetes Mother    Lumbar disc disease Mother    Migraines Mother    Hypertension Mother    Cancer - Prostate Father        mild   Diabetes Father    Hypertension Father    Breast cancer Neg Hx    Ovarian cancer Neg Hx    Colon cancer Neg Hx    Heart disease Neg Hx    Kidney cancer Neg Hx    Bladder Cancer Neg Hx      Allergies  Allergen Reactions   Cinnamon Other (See Comments)    Raises blood pressure   Procrit [Epoetin Alfa-Epbx] Other (See Comments)    Seizures, grand mal   Ace Inhibitors Cough   Azithromycin Other (See Comments)    seizures   Iron Other (See Comments)    Can not take in IV Form - causes seizures    Neomycin-Bacitracin Zn-Polymyx Other (See Comments)    Unknown   Other    Sulfa Antibiotics Other (See Comments)    Due to risks for seizures.     REVIEW OF SYSTEMS (Negative unless checked)  Constitutional: [] Weight loss  [] Fever  []   Chills Cardiac: [] Chest pain   [] Chest pressure   [] Palpitations   [] Shortness of breath when laying flat   [] Shortness of breath at rest   [] Shortness of breath with exertion. Vascular:  [] Pain in legs with walking   [] Pain in legs at rest   [] Pain in legs when laying flat   [] Claudication   [] Pain in feet when walking  [] Pain in feet at rest  [] Pain in feet when laying flat   [] History of DVT   [] Phlebitis   [x] Swelling in legs    [] Varicose veins   [] Non-healing ulcers Pulmonary:   [] Uses home oxygen   [] Productive cough   [] Hemoptysis   [] Wheeze  [] COPD   [] Asthma Neurologic:  [] Dizziness  [] Blackouts   [] Seizures   [] History of stroke   [] History of TIA  [] Aphasia   [] Temporary blindness   [] Dysphagia   [] Weakness or numbness in arms   [] Weakness or numbness in legs Musculoskeletal:  [] Arthritis   [] Joint swelling   [] Joint pain   [] Low back pain Hematologic:  [x] Easy bruising  [x] Easy bleeding   [] Hypercoagulable state   [x] Anemic   Gastrointestinal:  [] Blood in stool   [] Vomiting blood  [] Gastroesophageal reflux/heartburn   [] Abdominal pain Genitourinary:  [x] Chronic kidney disease   [] Difficult urination  [] Frequent urination  [] Burning with urination   [] Hematuria Skin:  [] Rashes   [] Ulcers   [] Wounds Psychological:  [] History of anxiety   []  History of major depression.  Physical Examination  BP (!) 152/68 (BP Location: Right Arm)   Pulse 75   Resp 16   Wt 195 lb 9.6 oz (88.7 kg)   BMI 35.78 kg/m  Gen:  WD/WN, NAD Head: [place]/AT, No temporalis wasting. Ear/Nose/Throat: Hearing grossly intact, nares w/o erythema or drainage Eyes: Conjunctiva clear. Sclera non-icteric Neck: Supple.  Trachea midline Pulmonary:  Good air movement, no use of accessory muscles.  Cardiac: RRR, no JVD Vascular: Good thrill is present in left radiocephalic AV fistula.  There is some skin thinning at the 2 primary access sites without much evidence that access has been rotated. Vessel Right Left  Radial Palpable Palpable       Musculoskeletal: M/S 5/5 throughout.  No deformity or atrophy.  Trace lower extremity edema. Neurologic: Sensation grossly intact in extremities.  Symmetrical.  Speech is fluent.  Psychiatric: Judgment intact, Mood & affect appropriate for pt's clinical situation. Dermatologic: No rashes or ulcers noted.  No cellulitis or open wounds.      Labs Recent Results (from the past 2160 hour(s))  Basic  metabolic panel     Status: Abnormal   Collection Time: 10/23/21  2:44 PM  Result Value Ref Range   Sodium 139 135 - 145 mmol/L   Potassium 4.0 3.5 - 5.1 mmol/L    Comment: HEMOLYSIS AT THIS LEVEL MAY AFFECT RESULT   Chloride 101 98 - 111 mmol/L   CO2 30 22 - 32 mmol/L   Glucose, Bld 139 (H) 70 - 99 mg/dL    Comment: Glucose reference range applies only to samples taken after fasting for at least 8 hours.   BUN 28 (H) 8 - 23 mg/dL   Creatinine, Ser 4.05 (H) 0.44 - 1.00 mg/dL   Calcium 8.7 (L) 8.9 - 10.3 mg/dL   GFR, Estimated 12 (L) >60 mL/min    Comment: (NOTE) Calculated using the CKD-EPI Creatinine Equation (2021)    Anion gap 8 5 - 15    Comment: Performed at [facility], [address] [place],  [place] [address]  CBC     Status: Abnormal   Collection Time: 10/23/21  2:44 PM  Result Value Ref Range   WBC 11.0 (H) 4.0 - 10.5 K/uL   RBC 3.22 (L) 3.87 - 5.11 MIL/uL   Hemoglobin 11.2 (L) 12.0 - 15.0 g/dL   HCT 34.7 (L) 36.0 - 46.0 %   MCV 107.8 (H) 80.0 - 100.0 fL   MCH 34.8 (H) 26.0 - 34.0 pg   MCHC 32.3 30.0 - 36.0 g/dL   RDW 13.8 11.5 - 15.5 %   Platelets 101 (L) 150 - 400 K/uL    Comment: Immature Platelet Fraction may be clinically indicated, consider ordering this additional test [id number] CONSISTENT WITH PREVIOUS RESULT REPEATED TO VERIFY    nRBC 0.0 0.0 - 0.2 %    Comment: Performed at [facility], [place], [place] [address]  Troponin I (High Sensitivity)     Status: None   Collection Time: 10/23/21  2:44 PM  Result Value Ref Range   Troponin I (High Sensitivity) 7 <18 ng/L    Comment: (NOTE) Elevated high sensitivity troponin I (hsTnI) values and significant  changes across serial measurements may suggest ACS but many other  chronic and acute conditions are known to elevate hsTnI results.  Refer to the "Links" section for chest pain algorithms and additional  guidance. Performed at [facility], [address] [place], [place] [address]   Hepatic function panel     Status: None   Collection Time: 10/23/21  2:44 PM  Result Value Ref Range   Total Protein 7.1 6.5 - 8.1 g/dL   Albumin 3.8 3.5 - 5.0 g/dL   AST 26 15 - 41 U/L   ALT 18 0 - 44 U/L   Alkaline Phosphatase 122 38 - 126 U/L   Total Bilirubin 0.8 0.3 - 1.2 mg/dL   Bilirubin, Direct 0.2 0.0 - 0.2 mg/dL   Indirect Bilirubin 0.6 0.3 - 0.9 mg/dL    Comment: Performed at [facility], [place], [place] [address]  Troponin I (High Sensitivity)     Status: None   Collection Time: 10/23/21  5:20 PM  Result Value Ref Range   Troponin I (High Sensitivity) 7 <18 ng/L    Comment: (NOTE) Elevated high sensitivity troponin I (hsTnI) values and significant  changes across serial measurements may suggest ACS but many other  chronic and acute conditions are known to elevate hsTnI results.  Refer to the "Links" section for chest pain algorithms and additional  guidance. Performed at [facility], [address] [place], [place] [address]     Radiology No results found.  Assessment/Plan Type 2 diabetes mellitus (HCC) blood glucose control important in reducing the progression of atherosclerotic disease. Also, involved in wound healing. On appropriate medications.     Essential (primary) hypertension blood pressure control important in reducing the progression of atherosclerotic disease. On appropriate oral medications.  ESRD on dialysis [facility]) Duplex today show a left radiocephalic AV fistula with a flow volume of 785 mL/min and some narrowing at the AV fistula anastomosis but this would not explain prolonged bleeding and this is still relatively normal-appearing fistula.  The biggest issue is the lack of rotation of her access sites.  If we can access her in different areas along the fistula this would markedly increase the durability of her fistula.  No role for intervention at current.  Recheck in 6  months.    Corene Cornea  Lucky Cowboy, MD  12/03/2021 10:46 AM    This note was created with Dragon medical transcription system.  Any errors from dictation are purely unintentional

## 2021-12-03 NOTE — Assessment & Plan Note (Signed)
Duplex today show a left radiocephalic AV fistula with a flow volume of 785 mL/min and some narrowing at the AV fistula anastomosis but this would not explain prolonged bleeding and this is still relatively normal-appearing fistula.  The biggest issue is the lack of rotation of her access sites.  If we can access her in different areas along the fistula this would markedly increase the durability of her fistula.  No role for intervention at current.  Recheck in 6 months.

## 2021-12-11 ENCOUNTER — Emergency Department
Admission: EM | Admit: 2021-12-11 | Discharge: 2021-12-11 | Disposition: A | Discharge status: Home / Self Care | Payer: Medicare Other | Attending: Student in an Organized Health Care Education/Training Program | Admitting: Student in an Organized Health Care Education/Training Program

## 2021-12-11 ENCOUNTER — Emergency Department: Payer: Medicare Other

## 2021-12-11 DIAGNOSIS — R0789 Other chest pain: Secondary | ICD-10-CM | POA: Diagnosis present

## 2021-12-11 DIAGNOSIS — R0602 Shortness of breath: Secondary | ICD-10-CM | POA: Diagnosis not present

## 2021-12-11 LAB — CBC
HCT: 33.2 % — ABNORMAL LOW (ref 36.0–46.0)
Hemoglobin: 10.7 g/dL — ABNORMAL LOW (ref 12.0–15.0)
MCH: 35.4 pg — ABNORMAL HIGH (ref 26.0–34.0)
MCHC: 32.2 g/dL (ref 30.0–36.0)
MCV: 109.9 fL — ABNORMAL HIGH (ref 80.0–100.0)
Platelets: 93 10*3/uL — ABNORMAL LOW (ref 150–400)
RBC: 3.02 MIL/uL — ABNORMAL LOW (ref 3.87–5.11)
RDW: 14.6 % (ref 11.5–15.5)
WBC: 10.4 10*3/uL (ref 4.0–10.5)
nRBC: 0 % (ref 0.0–0.2)

## 2021-12-11 LAB — TROPONIN I (HIGH SENSITIVITY)
Troponin I (High Sensitivity): 8 ng/L (ref <18)
Troponin I (High Sensitivity): 12 ng/L (ref <18)

## 2021-12-11 LAB — BASIC METABOLIC PANEL
Anion gap: 11 (ref 5–15)
BUN: 15 mg/dL (ref 8–23)
CO2: 30 mmol/L (ref 22–32)
Calcium: 8.3 mg/dL — ABNORMAL LOW (ref 8.9–10.3)
Chloride: 98 mmol/L (ref 98–111)
Creatinine, Ser: 3.04 mg/dL — ABNORMAL HIGH (ref 0.44–1.00)
GFR, Estimated: 17 mL/min — ABNORMAL LOW (ref >60)
Glucose, Bld: 285 mg/dL — ABNORMAL HIGH (ref 70–99)
Potassium: 3.3 mmol/L — ABNORMAL LOW (ref 3.5–5.1)
Sodium: 139 mmol/L (ref 135–145)

## 2021-12-11 NOTE — ED Provider Triage Note (Signed)
  Emergency Medicine Provider Triage Evaluation Note  Nina Johnston , a 61 y.o.female,  was evaluated in triage.  Pt complains of chest pain that occurred while she was at dialysis today.  Reports some radiation into her left upper extremity and the left side of her neck.    Review of Systems  Positive: Chest pain Negative: Denies fever, abdominal pain, vomiting  Physical Exam   Vitals:   12/11/21 1555  BP: (!) 155/60  Pulse: 74  Resp: 18  Temp: 97.9 F (36.6 C)  SpO2: 97%   Gen:   Awake, no distress   Resp:  Normal effort  MSK:   Moves extremities without difficulty  Other:    Medical Decision Making  Given the patient's initial medical screening exam, the following diagnostic evaluation has been ordered. The patient will be placed in the appropriate treatment space, once one is available, to complete the evaluation and treatment. I have discussed the plan of care with the patient and I have advised the patient that an ED physician or mid-level practitioner will reevaluate their condition after the test results have been received, as the results may give them additional insight into the type of treatment they may need.    Diagnostics: Labs, EKG, CXR  Treatments: none immediately   Teodoro Spray, [place] 12/11/21 [phone]

## 2021-12-11 NOTE — ED Provider Notes (Signed)
[facility] [place] Provider Note    Event Date/Time   First MD Initiated Contact with Patient 12/11/21 2134     (approximate)   History   Chest Pain   HPI  Nina Johnston is a 61 y.o. female ESRD on dialysis presents to the ER for evaluation of chest discomfort that occurred while on dialysis associated with some shortness of breath.  Lasted roughly an hour.  Did move to her shoulders and back.  Was not ripping or tearing in nature.  No fevers.  No chills.  Describes mild headache but no numbness or tingling.     Physical Exam   Triage Vital Signs: ED Triage Vitals  Enc Vitals Group     BP 12/11/21 1555 (!) 155/60     Pulse Rate 12/11/21 1555 74     Resp 12/11/21 1555 18     Temp 12/11/21 1555 97.9 F (36.6 C)     Temp Source 12/11/21 1555 Oral     SpO2 12/11/21 1555 97 %     Weight 12/11/21 1604 193 lb (87.5 kg)     Height --      Head Circumference --      Peak Flow --      Pain Score 12/11/21 1603 8     Pain Loc --      Pain Edu? --      Excl. in [place]? --     Most recent vital signs: Vitals:   12/11/21 2100 12/11/21 2130  BP: (!) 188/68 (!) 167/118  Pulse: 65 65  Resp: (!) 29 (!) 26  Temp:    SpO2: 96% 95%     Constitutional: Alert  Eyes: Conjunctivae are normal.  Head: Atraumatic. Nose: No congestion/rhinnorhea. Mouth/Throat: Mucous membranes are moist.   Neck: Painless ROM.  Cardiovascular:   Good peripheral circulation. No m/g/r Respiratory: Normal respiratory effort.  No retractions.  Gastrointestinal: Soft and nontender.  Musculoskeletal:  no deformity Neurologic:  MAE spontaneously. No gross focal neurologic deficits are appreciated.  Skin:  Skin is warm, dry and intact. No rash noted. Psychiatric: Mood and affect are normal. Speech and behavior are normal.    ED Results / Procedures / Treatments   Labs (all labs ordered are listed, but only abnormal results are displayed) Labs Reviewed  BASIC METABOLIC PANEL -  Abnormal; Notable for the following components:      Result Value   Potassium 3.3 (*)    Glucose, Bld 285 (*)    Creatinine, Ser 3.04 (*)    Calcium 8.3 (*)    GFR, Estimated 17 (*)    All other components within normal limits  CBC - Abnormal; Notable for the following components:   RBC 3.02 (*)    Hemoglobin 10.7 (*)    HCT 33.2 (*)    MCV 109.9 (*)    MCH 35.4 (*)    Platelets 93 (*)    All other components within normal limits  TROPONIN I (HIGH SENSITIVITY)  TROPONIN I (HIGH SENSITIVITY)     EKG  ED ECG REPORT I, Merlyn Lot, the attending physician, personally viewed and interpreted this ECG.   Date: 12/11/2021  EKG Time: 15:59  Rate: 75  Rhythm: sinus  Axis: normal  Intervals: borderline prolonged qt  ST&T Change: no stemi, no depressions    RADIOLOGY Please see ED Course for my review and interpretation.  I personally reviewed all radiographic images ordered to evaluate for the above acute complaints and reviewed radiology  reports and findings.  These findings were personally discussed with the patient.  Please see medical record for radiology report.    PROCEDURES:  Critical Care performed:   Procedures   MEDICATIONS ORDERED IN ED: Medications - No data to display   IMPRESSION / MDM / [place] / ED COURSE  I reviewed the triage vital signs and the nursing notes.                              Differential diagnosis includes, but is not limited to, ACS, pericarditis, esophagitis, boerhaaves, pe, dissection, pna, bronchitis, costochondritis  Patient presented to the ER for evaluation of symptoms as described above.  This presenting complaint could reflect a potentially life-threatening illness therefore the patient will be placed on continuous pulse oximetry and telemetry for monitoring.  Laboratory evaluation will be sent to evaluate for the above complaints.  Patient well-appearing in no acute distress.  She denies any chest pain or  pressure no shortness of breath.  States that she feels well.  Her EKG is nonischemic.  Cardiac enzymes have been cycled and are negative.  Is not clinically consistent with PE or dissection.  Her abdominal exam is soft and benign.  Not consistent with infectious process.  She is not any fever no white count no acute anemia.  Electrolytes appropriate at baseline for ESRD.  Discussed option for further observation in the hospital versus outpatient follow-up.  Patient family feel comfortable with outpatient follow-up.        FINAL CLINICAL IMPRESSION(S) / ED DIAGNOSES   Final diagnoses:  Atypical chest pain     Rx / DC Orders   ED Discharge Orders     None        Note:  This document was prepared using Dragon voice recognition software and may include unintentional dictation errors.    Merlyn Lot, MD 12/11/21 2147

## 2021-12-11 NOTE — ED Notes (Signed)
First nurse note-pt brought in via ems from dialysis.  Pt has chest pain.  Did not finish dialysis.  Iv in place.  324 asa given by ems.  Bp 169/82, p-86, oxygen sats 98%  pt alert in wheelchair in lobby

## 2021-12-11 NOTE — ED Triage Notes (Signed)
Pt arrives via EMS from dialysis. Per Pt she started to have CP while having therapy. Per pt she woke up with chest pain and diarrhea. Pt is having left sided shoulder and neck pain as well. Pt only completed 3/4 of her treatment.

## 2022-01-07 ENCOUNTER — Telehealth: Payer: Self-pay | Admitting: Neurology

## 2022-01-07 MED ORDER — LAMICTAL 200 MG PO TABS: 300.0000 mg | ORAL_TABLET | Freq: Two times a day (BID) | ORAL | 0 refills | Status: DC

## 2022-01-07 NOTE — Telephone Encounter (Signed)
Unsure why the pharmacy is trying to fill for generic. The script has been resent for brand name and there is a active PA on filed for the patient for the brand name to be covered until 05/04/2022

## 2022-01-07 NOTE — Telephone Encounter (Signed)
Pr mother Donnajean Lopes) is calling. Donnajean Lopes is requesting a nurse call [id number]  and let them know Pt can only take brand name for lamoTRIgine (LAMICTAL) 200 MG tablet. Marylee said when Pt take generic she still have seizures.

## 2022-01-16 ENCOUNTER — Ambulatory Visit (INDEPENDENT_AMBULATORY_CARE_PROVIDER_SITE_OTHER): Payer: Medicare Other | Admitting: Nurse Practitioner

## 2022-01-16 ENCOUNTER — Encounter (INDEPENDENT_AMBULATORY_CARE_PROVIDER_SITE_OTHER): Payer: Medicare Other

## 2022-01-17 ENCOUNTER — Encounter (INDEPENDENT_AMBULATORY_CARE_PROVIDER_SITE_OTHER): Payer: Medicare Other

## 2022-01-17 ENCOUNTER — Ambulatory Visit (INDEPENDENT_AMBULATORY_CARE_PROVIDER_SITE_OTHER): Payer: Medicare Other | Admitting: Nurse Practitioner

## 2022-02-12 ENCOUNTER — Encounter: Payer: Self-pay | Admitting: Neurology

## 2022-02-12 ENCOUNTER — Ambulatory Visit (INDEPENDENT_AMBULATORY_CARE_PROVIDER_SITE_OTHER): Payer: Medicare Other | Admitting: Neurology

## 2022-02-12 VITALS — BP 149/64 | HR 66 | Ht 62.0 in | Wt 192.0 lb

## 2022-02-12 DIAGNOSIS — R404 Transient alteration of awareness: Secondary | ICD-10-CM

## 2022-02-12 DIAGNOSIS — R4789 Other speech disturbances: Secondary | ICD-10-CM | POA: Diagnosis not present

## 2022-02-12 DIAGNOSIS — D638 Anemia in other chronic diseases classified elsewhere: Secondary | ICD-10-CM

## 2022-02-12 DIAGNOSIS — Z9981 Dependence on supplemental oxygen: Secondary | ICD-10-CM | POA: Insufficient documentation

## 2022-02-12 DIAGNOSIS — Z992 Dependence on renal dialysis: Secondary | ICD-10-CM

## 2022-02-12 DIAGNOSIS — E11649 Type 2 diabetes mellitus with hypoglycemia without coma: Secondary | ICD-10-CM

## 2022-02-12 DIAGNOSIS — G40209 Localization-related (focal) (partial) symptomatic epilepsy and epileptic syndromes with complex partial seizures, not intractable, without status epilepticus: Secondary | ICD-10-CM | POA: Diagnosis not present

## 2022-02-12 DIAGNOSIS — R270 Ataxia, unspecified: Secondary | ICD-10-CM

## 2022-02-12 DIAGNOSIS — Z9989 Dependence on other enabling machines and devices: Secondary | ICD-10-CM

## 2022-02-12 MED ORDER — ETHOSUXIMIDE 250 MG/5ML PO SOLN: ORAL | 3 refills | Status: DC

## 2022-02-12 MED ORDER — CLONAZEPAM 1 MG PO TABS: 1.0000 mg | ORAL_TABLET | Freq: Every day | ORAL | 3 refills | Status: DC

## 2022-02-12 MED ORDER — LAMICTAL 200 MG PO TABS
300.0000 mg | ORAL_TABLET | Freq: Two times a day (BID) | ORAL | 0 refills | Status: DC
Start: 2022-02-12 — End: 2022-04-07

## 2022-02-12 NOTE — Patient Instructions (Addendum)
Postviral changes, pneumonia, viral - not hospitalized.  She also had "Pinkeye" which her physicians identified is related to her viral infect. She was treated with steroids.  She is on hemodialysis.  She may have lost fluid with diarrhea, her dry weight had to be adjusted.   She lost 35 pounds, had crackles in her lung, now cleared up. She had cardiomegaly and pulmonary vascular congestion. She has had an older CPAP and could be replaced. She remains on supplemental oxygen and CPAP at home, needs a newer machine.   We discussed a HST on a non-dialysis day. Alternatively we can ask her to come in for a SPLIT. In BED 2 , AHI 10/h  She likes a nasal mask.     I would like for Nina Johnston to bring either her CPAP device so that we can load data from its memory chip or to mail the memory trip to the medical equipment company.  Her CPAP is provided by [employer].  From the [place] office.  Alternatively we will ask the patient to give [place] a serial number either by phone or email for her CPAP so that we have some compliance data.  She is due for new machine as stated above she is already on 2 L of oxygen at home so she does not need to prove need of oxygen..  I refilled her seizure medications today she has regular blood draws at the dialysis unit.

## 2022-02-12 NOTE — Progress Notes (Signed)
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 (DEXCOM G6 SENSOR) MISC Use 1 every 10 days     Continuous Blood Gluc Transmit (DEXCOM G6 TRANSMITTER) MISC USE EVERY 3 MONTHS     ethosuximide (ZARONTIN) 250 MG/5ML solution TAKE 5 ML BY MOUTH TWICE A DAY 473 mL 3   famotidine (PEPCID) 40 MG tablet SMARTSIG:1 Tablet(s) By Mouth Every Evening     Febuxostat 80 MG TABS Take 80 mg by mouth daily.      fexofenadine (ALLEGRA) 180 MG tablet Take 180 mg by mouth daily.      Fluticasone-Salmeterol (ADVAIR) 250-50 MCG/DOSE AEPB Inhale 1 puff into the lungs 2 (two) times daily.     furosemide (LASIX) 20 MG tablet Take 30 mg by mouth 2 (two) times daily.      hydrALAZINE (APRESOLINE) 50 MG tablet Take 100 mg by mouth 2 (two) times daily.     HYDROcodone-acetaminophen (NORCO/VICODIN) 5-325 MG tablet Take 1 tablet by mouth every 6 (six) hours as needed.      insulin glargine (LANTUS) 100 UNIT/ML injection Inject 22 Units into the skin at bedtime. 16 units on Monday,Wednesday,Friday, 12 units on dialysis days     insulin lispro (HUMALOG) 100 UNIT/ML injection Inject 8-15 Units into the skin 3 (three) times daily with meals. Per sliding  scale     LAMICTAL 200 MG tablet Take 1.5 tablets (300 mg total) by mouth 2 (two) times daily. 270 tablet 0   levETIRAcetam (KEPPRA) 250 MG tablet Take Bid on non -dialysis days, 3 a day on hemodialysis days. 270 tablet 5   levothyroxine (SYNTHROID) 150 MCG tablet Take 150 mcg by mouth daily before breakfast.      lidocaine-prilocaine (EMLA) cream Apply topically as directed.     Methoxy PEG-Epoetin Beta (MIRCERA IJ) Inject as directed. Receives w/ dialysis, unsure how often right now     metoprolol succinate (TOPROL XL) 50 MG 24 hr tablet Take 1 tablet (50 mg total) by mouth 2 (two) times daily. Take with or immediately following a meal. (Patient taking differently: Take 75 mg by mouth 2 (two) times daily.) 60 tablet 11  montelukast (SINGULAIR) 10 MG tablet SMARTSIG:1 Tablet(s) By Mouth Every Evening     nystatin-triamcinolone (MYCOLOG II) cream Apply 1 application topically 2 (two) times daily. (Patient taking differently: Apply 1 application  topically daily as needed (rash).) 30 g 1   pantoprazole (PROTONIX) 40 MG tablet Take 40 mg by mouth every morning.      Probiotic Product (ALIGN) 4 MG CAPS Take 4 mg by mouth daily.      sevelamer carbonate (RENVELA) 800 MG tablet Take 3,200 mg by mouth daily.     triamcinolone (NASACORT) 55 MCG/ACT AERO nasal inhaler Place 2 sprays into the nose at bedtime.      UNIFINE PENTIPS 32G X 4 MM MISC USE UP TO 5 TIMES DAILY AS DIRECTED  3   valsartan (DIOVAN) 160 MG tablet Take 160 mg by mouth 2 (two) times daily.     No current facility-administered medications for this visit.    Allergies as of 02/12/2022 - Review Complete 02/12/2022  Allergen Reaction Noted   Cinnamon Other (See Comments) 11/09/2012   Procrit [epoetin alfa-epbx] Other (See Comments) 11/10/2012   Ace inhibitors Cough 09/06/2014   Azithromycin Other (See Comments) 09/06/2014   Iron Other (See Comments) 03/31/2016   Neomycin-bacitracin zn-polymyx Other (See Comments) 09/06/2014   Other   01/27/2020   Sulfa antibiotics Other (See Comments) 09/06/2014     Hemoglobin at 10.7 ! Last transfusion 06/2017 .   Vitals: BP (!) 149/64   Pulse 66   Ht 5\' 2"  (1.575 m)   Wt 192 lb (87.1 kg)   BMI 35.12 kg/m  Last Weight:  Wt Readings from Last 1 Encounters:  02/12/22 192 lb (87.1 kg)   Last Height:   Ht Readings from Last 1 Encounters:  02/12/22 5\' 2"  (1.575 m)   Vision Screening:   Physical exam:  General: The patient is awake, alert and appears not in acute distress. The patient is well groomed. Head: Normocephalic, atraumatic. Neck is supple.  Mallampati 1 - there is no uvula , neck circumference:15.5 inches.    On renal low potassium diet, she lost 45 pounds, BMI 37 kg/m2  . Saturday  was hemodialysis day - 48 hours since last treatment.   Cardiovascular:  Regular rate and rhythm, without  murmurs or carotid bruit, and without distended neck veins. Respiratory: Lungs are clear to auscultation. Skin: ankle  edema, no rash.  Looking bronzed .  Dry skin, Thrill over her AV fistula.  Left arm, non dominant hand.  Trunk: BMI is morbidly obese-  Abdominal hernia.  patient has normal posture.   Neurologic exam : The patient is awake and alert, oriented to place and time.she is subdued.  Memory subjective described as intact.  There is a normal attention span & concentration ability.  Speech is fluent with hoarseness.   Mood and affect are normal. Cranial nerves: Pupils are equal and slowly  reactive to light.  No disrounded pupils, beginning cloudiness, cataract right over left-  Yellowish sclerea.  Visual fields by finger perimetry are intact.Hearing to tuning fork  rub intact. Facial sensation intact to fine touch.  Facial motor strength is symmetric and tongue moves midline.  Motor exam:  reduced tone and normal muscle bulk, obesity related attenuated DTRs but not absent.  and symmetric normal strength in upper extremities.  Grip strength is weaker over the  left - due to fistula.  She has her AV access fistula on the left arm. Thrill is palpable and loudly audible.  Her family  stated she is using the left arm less.   Has less grip strength on the left shoulder is droopy on the left- . Now with clumsiness more on the right as well. Tremor.  (She now showers while  seated).   Sensory: Fine touch,and vibration were tested in all extremities and are present in toes and feet. Proprioception is tested and normal.  Coordination:  Left arm with AV fistula and thrill- Rapid alternating movements in the fingers/hands is tested - . Finger-to-nose maneuver tested l with left side evidence of ataxia, dysmetria and a new non resting, action  tremor.   Gait and station: Patient walks without assistive device and is very slow -  Appears truncally rigid, with no lumbar rotation, turns with 5 steps, and has reduced step width.  . Stance is stable and of wider base. Tandem gait is fragmented. She shuffles.    Deep tendon reflexes: in the upper and lower extremities are symmetric and intact.  Babinski maneuver response is down going on the left and equivocal on the right.   ASSESSMENT :  She is on hemodialysis. She may have lost fluid with diarrhea, her dry weight had to be adjusted.  She lost 35 pounds, had crackles in her lung, now cleared up.   She had cardiomegaly and pulmonary vascular congestion. She has had an older CPAP and could be replaced. She remains on supplemental oxygen and CPAP at home, needs a newer machine.  We discussed a HST on a non-dialysis day. Alternatively we can ask her to come in for a SPLIT. Non dialysis day. In BED 2 , AHI 10/h  . She is on oxygen every night, 2 liters. She likes a nasal mask.   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  Currently no seizures reported.  Stool incontinence related to virus infection, was on steroids, post viral medication.   1) currently ( since may ) no spells of  reduced awareness and trembling, affecting fine motor skills, handwriting. She demonstrated her handwriting and card shuffling skills here, they have changed.  history of seizure like events under Hypoglycemia, under HD while on erythropoietin, and when upset..  Will keep on anti- epileptic meds, continue citric acid iron po.  Encouraged physical activity, walking, rowing and stationary bike. She has not had a witnessed seizure spell since Dec 2021.   She had therapeutic levels of her drugs. No further intervention.    Rv in 6 months  Larey Seat, MD   08-15-2019.

## 2022-02-13 ENCOUNTER — Telehealth: Payer: Self-pay | Admitting: Neurology

## 2022-02-13 NOTE — Telephone Encounter (Signed)
Received a call from the patient's mom with information in regards to CPAP. Pt is using a [employer] machine and the current machine is around 62 yrs old (mom guesses). She states that she was following a pulmonologist in [place] who indicated she should "buy a CPAP at [facility]" The patient currently also uses 2 L oxygen that is bled into the CPAP. All of this is currently managed by [facility] who claimed they have a CPAP ready for her. She states that they never went to get the machine.  She was having her CPAP and oxygen care followed by a pulmonologist in the [place] system and after discussing Dr Dohmeier is going to order a over night sleep study for the patient to complete. Once we have that result, Dr Brett Fairy will order the new machine under the new pressure and we can decide who to move forward with.  She is agreeable to this plan. The patient needs to plan the sleep study on a night she doesn't have dialysis. Her dialysis days are Monday, wed and fri. Would need to plan SS tues,thurs, Saturday or Sunday.  The mom was agreeable to this plan. Will make sure it is noted that she is currently  on CPAP  at 2L

## 2022-02-26 NOTE — Telephone Encounter (Signed)
Split - Medicare/BCBS supp no auth req- pt is on dialysis, bed 2 & she is on oxygen every night, 2 liters she likes a nasal mask per Dr. Brett Fairy notes.  Spoke to the patient mother Patient is scheduled at [facility] for 04/22/22 at 8 pm.  Mailed packet to the patient.

## 2022-03-03 ENCOUNTER — Encounter (INDEPENDENT_AMBULATORY_CARE_PROVIDER_SITE_OTHER): Payer: Self-pay

## 2022-03-17 ENCOUNTER — Emergency Department: Payer: Medicare Other

## 2022-03-17 ENCOUNTER — Other Ambulatory Visit: Payer: Self-pay

## 2022-03-17 ENCOUNTER — Encounter: Payer: Self-pay | Admitting: Emergency Medicine

## 2022-03-17 ENCOUNTER — Emergency Department
Admission: EM | Admit: 2022-03-17 | Discharge: 2022-03-17 | Disposition: A | Discharge status: Home / Self Care | Payer: Medicare Other | Attending: Emergency Medicine | Admitting: Emergency Medicine

## 2022-03-17 DIAGNOSIS — Z992 Dependence on renal dialysis: Secondary | ICD-10-CM | POA: Diagnosis not present

## 2022-03-17 DIAGNOSIS — R079 Chest pain, unspecified: Secondary | ICD-10-CM | POA: Diagnosis present

## 2022-03-17 DIAGNOSIS — R000 Tachycardia, unspecified: Secondary | ICD-10-CM | POA: Diagnosis not present

## 2022-03-17 DIAGNOSIS — R002 Palpitations: Secondary | ICD-10-CM | POA: Insufficient documentation

## 2022-03-17 DIAGNOSIS — N186 End stage renal disease: Secondary | ICD-10-CM | POA: Diagnosis not present

## 2022-03-17 LAB — COMPREHENSIVE METABOLIC PANEL
ALT: 11 U/L (ref 0–44)
AST: 23 U/L (ref 15–41)
Albumin: 3.8 g/dL (ref 3.5–5.0)
Alkaline Phosphatase: 157 U/L — ABNORMAL HIGH (ref 38–126)
Anion gap: 13 (ref 5–15)
BUN: 18 mg/dL (ref 8–23)
CO2: 27 mmol/L (ref 22–32)
Calcium: 8.3 mg/dL — ABNORMAL LOW (ref 8.9–10.3)
Chloride: 99 mmol/L (ref 98–111)
Creatinine, Ser: 3.68 mg/dL — ABNORMAL HIGH (ref 0.44–1.00)
GFR, Estimated: 13 mL/min — ABNORMAL LOW (ref >60)
Glucose, Bld: 337 mg/dL — ABNORMAL HIGH (ref 70–99)
Potassium: 3 mmol/L — ABNORMAL LOW (ref 3.5–5.1)
Sodium: 139 mmol/L (ref 135–145)
Total Bilirubin: 0.9 mg/dL (ref 0.3–1.2)
Total Protein: 6.7 g/dL (ref 6.5–8.1)

## 2022-03-17 LAB — CBC WITH DIFFERENTIAL/PLATELET
Abs Immature Granulocytes: 0.1 10*3/uL — ABNORMAL HIGH (ref 0.00–0.07)
Basophils Absolute: 0.1 10*3/uL (ref 0.0–0.1)
Basophils Relative: 1 %
Eosinophils Absolute: 0.1 10*3/uL (ref 0.0–0.5)
Eosinophils Relative: 1 %
HCT: 34.3 % — ABNORMAL LOW (ref 36.0–46.0)
Hemoglobin: 11 g/dL — ABNORMAL LOW (ref 12.0–15.0)
Immature Granulocytes: 1 %
Lymphocytes Relative: 14 %
Lymphs Abs: 1.2 10*3/uL (ref 0.7–4.0)
MCH: 34.4 pg — ABNORMAL HIGH (ref 26.0–34.0)
MCHC: 32.1 g/dL (ref 30.0–36.0)
MCV: 107.2 fL — ABNORMAL HIGH (ref 80.0–100.0)
Monocytes Absolute: 0.4 10*3/uL (ref 0.1–1.0)
Monocytes Relative: 5 %
Neutro Abs: 6.7 10*3/uL (ref 1.7–7.7)
Neutrophils Relative %: 78 %
Platelets: 112 10*3/uL — ABNORMAL LOW (ref 150–400)
RBC: 3.2 MIL/uL — ABNORMAL LOW (ref 3.87–5.11)
RDW: 14.6 % (ref 11.5–15.5)
WBC: 8.6 10*3/uL (ref 4.0–10.5)
nRBC: 0 % (ref 0.0–0.2)

## 2022-03-17 LAB — BRAIN NATRIURETIC PEPTIDE: B Natriuretic Peptide: 691.1 pg/mL — ABNORMAL HIGH (ref 0.0–100.0)

## 2022-03-17 LAB — TROPONIN I (HIGH SENSITIVITY)
Troponin I (High Sensitivity): 9 ng/L (ref <18)
Troponin I (High Sensitivity): 19 ng/L — ABNORMAL HIGH (ref <18)

## 2022-03-17 LAB — CBG MONITORING, ED
Glucose-Capillary: 317 mg/dL — ABNORMAL HIGH (ref 70–99)
Glucose-Capillary: 269 mg/dL — ABNORMAL HIGH (ref 70–99)

## 2022-03-17 MED ORDER — IOHEXOL 350 MG/ML SOLN
75.0000 mL | Freq: Once | INTRAVENOUS | Status: AC | PRN
  Administered 2022-03-17: 75 mL via INTRAVENOUS

## 2022-03-17 NOTE — ED Notes (Signed)
Patient verbalized understanding for discharge. Pen pad not working in room to sign for discharge. No questions for RN.

## 2022-03-17 NOTE — ED Notes (Signed)
Dialysis called at this time to come de-access fistula that was left in place from [facility] dialysis.

## 2022-03-17 NOTE — ED Provider Notes (Signed)
[facility] Provider Note   Event Date/Time   First MD Initiated Contact with Patient 03/17/22 1527     (approximate) History  Chest Pain  HPI Nina Johnston is a 61 y.o. female with a past medical history of end-stage renal disease on dialysis Monday/Wednesday/Friday who presents from dialysis clinic with concerns for sudden onset tachycardia and chest pain.  Patient describes the chest pain as substernal, sharp, and radiating to the left arm and bilateral posterior neck.  Patient states that the pain is currently 8/10 in severity and has been stable since onset.  Patient endorses accompanying palpitations.  Patient denies any history of similar symptoms.  Patient denies any personal or family history of heart disease. ROS: Patient currently denies any vision changes, tinnitus, difficulty speaking, facial droop, sore throat, shortness of breath, abdominal pain, nausea/vomiting/diarrhea, dysuria, or weakness/numbness/paresthesias in any extremity   Physical Exam  Triage Vital Signs: ED Triage Vitals  Enc Vitals Group     BP 03/17/22 1530 (!) 164/71     Pulse Rate 03/17/22 1530 92     Resp 03/17/22 1530 12     Temp 03/17/22 1531 98.7 F (37.1 C)     Temp Source 03/17/22 1531 Oral     SpO2 03/17/22 1530 96 %     Weight --      Height --      Head Circumference --      Peak Flow --      Pain Score 03/17/22 1535 8     Pain Loc --      Pain Edu? --      Excl. in [place]? --    Most recent vital signs: Vitals:   03/17/22 1734 03/17/22 1800  BP: (!) 165/64 (!) 167/69  Pulse: 72 72  Resp: (!) 21 18  Temp:    SpO2: 98% 92%   General: Awake, oriented x4. CV:  Good peripheral perfusion.  Resp:  Normal effort.  Abd:  No distention.  Other:  Middle-aged obese Caucasian female laying in bed in no acute distress.  AV fistula to left forearm that is currently accessed with large-bore IVs in place ED Results / Procedures / Treatments  Labs (all labs ordered are  listed, but only abnormal results are displayed) Labs Reviewed  COMPREHENSIVE METABOLIC PANEL - Abnormal; Notable for the following components:      Result Value   Potassium 3.0 (*)    Glucose, Bld 337 (*)    Creatinine, Ser 3.68 (*)    Calcium 8.3 (*)    Alkaline Phosphatase 157 (*)    GFR, Estimated 13 (*)    All other components within normal limits  CBC WITH DIFFERENTIAL/PLATELET - Abnormal; Notable for the following components:   RBC 3.20 (*)    Hemoglobin 11.0 (*)    HCT 34.3 (*)    MCV 107.2 (*)    MCH 34.4 (*)    Platelets 112 (*)    Abs Immature Granulocytes 0.10 (*)    All other components within normal limits  BRAIN NATRIURETIC PEPTIDE - Abnormal; Notable for the following components:   B Natriuretic Peptide 691.1 (*)    All other components within normal limits  CBG MONITORING, ED - Abnormal; Notable for the following components:   Glucose-Capillary 317 (*)    All other components within normal limits  CBG MONITORING, ED - Abnormal; Notable for the following components:   Glucose-Capillary 269 (*)    All other components within normal limits  TROPONIN I (HIGH SENSITIVITY) - Abnormal; Notable for the following components:   Troponin I (High Sensitivity) 19 (*)    All other components within normal limits  TROPONIN I (HIGH SENSITIVITY)   EKG ED ECG REPORT I, Naaman Plummer, the attending physician, personally viewed and interpreted this ECG. Date: 03/17/2022 EKG Time: 1527 Rate: 94 Rhythm: normal sinus rhythm QRS Axis: normal Intervals: normal ST/T Wave abnormalities: normal Narrative Interpretation: no evidence of acute ischemia RADIOLOGY ED MD interpretation: CT angiography of the chest with IV contrast shows no evidence of pulmonary embolism or other acute intrathoracic findings. One-view portable chest x-ray interpreted by me shows no evidence of acute abnormalities including no pneumonia, pneumothorax, or widened mediastinum -Agree with radiology  assessment Official radiology report(s): CT Angio Chest PE W/Cm &/Or Wo Cm  Result Date: 03/17/2022 CLINICAL DATA:  Pulmonary embolism (PE) suspected, high prob EXAM: CT ANGIOGRAPHY CHEST WITH CONTRAST TECHNIQUE: Multidetector CT imaging of the chest was performed using the standard protocol during bolus administration of intravenous contrast. Multiplanar CT image reconstructions and MIPs were obtained to evaluate the vascular anatomy. RADIATION DOSE REDUCTION: This exam was performed according to the departmental dose-optimization program which includes automated exposure control, adjustment of the mA and/or kV according to patient size and/or use of iterative reconstruction technique. CONTRAST:  57mL OMNIPAQUE IOHEXOL 350 MG/ML SOLN COMPARISON:  10/23/2021, 01/08/2018 FINDINGS: Cardiovascular: Satisfactory opacification of the pulmonary arteries to the segmental branch level. No filling defects identified to suggest pulmonary embolism. Pulmonary trunk is dilated at 3.7 cm. Thoracic aorta is nonaneurysmal. Scattered atherosclerotic vascular calcifications of the aorta and coronary arteries. Cardiomegaly. No pericardial effusion. Mediastinum/Nodes: No axillary, mediastinal, or hilar lymphadenopathy. Multinodular thyroid with extension of heterogeneous left thyroid nodule into the superior mediastinum, unchanged. There is mass effect upon the adjacent trachea, deviated to the right. Trachea and esophagus otherwise within normal limits. Small hiatal hernia. Lungs/Pleura: Hypoventilatory changes to the lungs. No focal airspace consolidation, pleural effusion, or pneumothorax. Upper Abdomen: Stable benign left adrenal myelolipoma, which requires no follow-up imaging. 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 No new or acute upper abdominal findings. Musculoskeletal: No chest wall abnormality. No acute or significant osseous findings. 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 Review of the MIP images confirms the above findings. IMPRESSION: 1. No evidence of pulmonary embolism or other acute intrathoracic findings. 2. Cardiomegaly and findings suggestive of pulmonary arterial hypertension. 3. Enlarged multinodular thyroid gland. Recommend nonemergent thyroid ultrasound, if not previously performed. 4. Small hiatal hernia. 5. Aortic and coronary artery atherosclerosis  (ICD10-I70.0). 6. 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 No follow-up imaging is recommended at this time. Electronically Signed   By: Davina Poke D.O.   On: 03/17/2022 17:30   DG Chest Port 1 View  Result Date: 03/17/2022 CLINICAL DATA:  Sudden onset chest pain EXAM: PORTABLE CHEST 1 VIEW COMPARISON:  12/11/2021 FINDINGS: Single frontal view of the chest demonstrates an enlarged cardiac silhouette. Chronic central vascular congestion without airspace disease, effusion, or pneumothorax. Prior healed left humeral fracture. No acute bony abnormality. IMPRESSION: 1. Chronic enlarged cardiac silhouette and central vascular congestion. No acute airspace disease. Electronically Signed   By: Randa Ngo M.D.   On: 03/17/2022 16:11   PROCEDURES: Critical Care performed: No .1-3 Lead EKG Interpretation  Performed by: Naaman Plummer, MD Authorized by: Naaman Plummer, MD     Interpretation: normal     ECG rate:  71   ECG  rate assessment: normal     Rhythm: sinus rhythm     Ectopy: none     Conduction: normal    MEDICATIONS ORDERED IN ED: Medications  iohexol (OMNIPAQUE) 350 MG/ML injection 75 mL (75 mLs Intravenous Contrast Given 03/17/22 1704)   IMPRESSION / MDM / ASSESSMENT AND PLAN / ED COURSE  I reviewed the triage vital signs and the nursing notes.                             The patient is on the cardiac monitor to evaluate for evidence of arrhythmia and/or significant heart rate changes. Patient's presentation is most consistent  with acute presentation with potential threat to life or bodily function. Workup: ECG, CXR, CBC, BMP, Troponin Findings: ECG: No overt evidence of STEMI. No evidence of Brugadas sign, delta wave, epsilon wave, significantly prolonged QTc, or malignant arrhythmia HS Troponin: Negative x1 Other Labs unremarkable for emergent problems. CXR: Without PTX, PNA, or widened mediastinum HEART Score: 4  Given History, Exam, and Workup I have low suspicion for ACS, Pneumothorax, Pneumonia, Pulmonary Embolus, Tamponade, Aortic Dissection or other emergent problem as a cause for this presentation.   Reassesment: Prior to discharge patients pain was controlled and they were well appearing.  Disposition:  Discharge. Strict return precautions discussed with patient with full understanding. Advised patient to follow up promptly with primary care provider    FINAL CLINICAL IMPRESSION(S) / ED DIAGNOSES   Final diagnoses:  Chest pain, unspecified type  Palpitations  Sinus tachycardia   Rx / DC Orders   ED Discharge Orders          Ordered    Ambulatory referral to Cardiology       Comments: If you have not heard from the Cardiology office within the next 72 hours please call [phone].   03/17/22 1815           Note:  This document was prepared using Dragon voice recognition software and may include unintentional dictation errors.   Naaman Plummer, MD 03/17/22 [phone]

## 2022-03-17 NOTE — ED Triage Notes (Signed)
Patient to ED via [facility] from dialysis for sudden chest pain. Patient was receiving treatment when pain started heart rate went 70 to the 130's. Patient described it as sharp/ dull and radiates to left arm and back. No known cardiac hx. Patient had approx 40 minutes left of dialysis.

## 2022-03-25 NOTE — Telephone Encounter (Signed)
Someone picked up the phone and stated she is at another doctor appointments right now. I asked them to let her know to call me when she can because we would need to r/s her sleep study.

## 2022-03-31 NOTE — Telephone Encounter (Signed)
Spoke with Dr. Brett Fairy, she states doing a sleep study on the day of hemodialysis would not be valid. She would be low fluid "dry weight'.  She prefers to have her push out sleep study to schedule on a non dialysis day.  Per Dr. Brett Fairy: "Hemo-dialysis patients have different fluid status on days on and days off treatment, it would likely reduce her apnea to be tested on a day post HD."

## 2022-03-31 NOTE — Telephone Encounter (Signed)
Patient mother Donnajean Lopes called me back and left a voicemail.  I called her back and left a voicemail for her to call me back to r/s her daughter SS

## 2022-03-31 NOTE — Telephone Encounter (Signed)
I spoke with patient mother Donnajean Lopes and advised her what Dr. Brett Fairy is requesting. She was okay with scheduling it out.  The patient is r/s for 06/08/22 at 9 pm.  Mailed new packet to the patient.

## 2022-03-31 NOTE — Telephone Encounter (Signed)
Patient mother Donnajean Lopes returned my call. I told her I had only two appt left for this year and that is for Dec 22 and Dec 29 for an in lab SS. She stated that Dr. Brett Fairy wanted her to do it on a known dialysis day and she does dialysis on Monday, Wednesday and Friday.  But the mother does not want her to wait until February when I have my next opening to do the SS for her daughter. She wants to know if her daughter can do the SS on one of those dialysis days on one of those Fridays.

## 2022-04-04 ENCOUNTER — Other Ambulatory Visit: Payer: Self-pay | Admitting: Neurology

## 2022-04-07 ENCOUNTER — Other Ambulatory Visit: Payer: Self-pay | Admitting: *Deleted

## 2022-04-07 ENCOUNTER — Telehealth: Payer: Self-pay | Admitting: Neurology

## 2022-04-07 MED ORDER — LAMICTAL 200 MG PO TABS: 300.0000 mg | ORAL_TABLET | Freq: Two times a day (BID) | ORAL | 1 refills | Status: DC

## 2022-04-07 NOTE — Telephone Encounter (Signed)
Pt's mother, Iyonnah Ferrante. Pt will be out of LAMICTAL 200 MG tablet in 2 days. I know Dr.Dohmeier is out. Can you send refill to the work in neurologist. Make sure [place] NAME.   Refill for Lamictal was sent to Dr. Dorna Leitz on December 1.Marland Kitchen

## 2022-04-07 NOTE — Telephone Encounter (Signed)
This is not a controlled medication. I e-scribed rx to pharmacy.

## 2022-04-07 NOTE — Telephone Encounter (Signed)
Called pharmacy. PA needed for brand name Lamictal. Last filled 01/08/22 for 90 days supply. RxBIN: [id number], RxPCN: [id number], RXGrp: [id number]. Phone: [phone]  Received the following response when I tried to initiate PA on CMM. Key: [id number]. "No Medicare coverage was found. Please reconfirm that the patient has active Medicare Part D coverage before submitting. You may also return to the dashboard and select a commercial form if the patient has commercial coverage."  I called and spoke with mother.  Confirmed she has BlueMedicare prescription coverage ID: [id number] RXBIN: [id number] RXPCN: [id number] RXGrp: [id number] Confirmed phone# and address on file.  She has two days of medication of left. Aware we will call in the morning to try and do PA over the phone urgently.  I called [phone]. This was to [facility]. Spoke w/ Crystal. Auth only done w/ [place]. She transferred me. Spoke w/ Merry Proud. Transferred me to [facility]. I completed PA over the phone. Marked urgent. They will call back for determination or fax.

## 2022-04-08 NOTE — Telephone Encounter (Signed)
Received fax from [facility] that [place] approved for brand name Lamictal until 04/08/23. I called and informed mother. She verbalized understanding and appreciation.

## 2022-04-08 NOTE — Telephone Encounter (Signed)
[employer] Barbaraann Rondo) PA for LAMICTAL 200 MG tablet  has been approved.

## 2022-04-25 ENCOUNTER — Other Ambulatory Visit: Payer: Self-pay | Admitting: Internal Medicine

## 2022-04-25 DIAGNOSIS — Z1231 Encounter for screening mammogram for malignant neoplasm of breast: Secondary | ICD-10-CM

## 2022-05-22 ENCOUNTER — Ambulatory Visit
Admission: RE | Admit: 2022-05-22 | Discharge: 2022-05-22 | Disposition: A | Discharge status: Home / Self Care | Payer: Medicare Other | Source: Ambulatory Visit | Attending: Internal Medicine | Admitting: Internal Medicine

## 2022-05-22 DIAGNOSIS — Z1231 Encounter for screening mammogram for malignant neoplasm of breast: Secondary | ICD-10-CM

## 2022-05-29 ENCOUNTER — Other Ambulatory Visit: Payer: Self-pay | Admitting: Internal Medicine

## 2022-05-29 DIAGNOSIS — R928 Other abnormal and inconclusive findings on diagnostic imaging of breast: Secondary | ICD-10-CM

## 2022-05-29 DIAGNOSIS — R921 Mammographic calcification found on diagnostic imaging of breast: Secondary | ICD-10-CM

## 2022-06-03 ENCOUNTER — Ambulatory Visit (INDEPENDENT_AMBULATORY_CARE_PROVIDER_SITE_OTHER): Payer: Medicare Other

## 2022-06-03 ENCOUNTER — Encounter (INDEPENDENT_AMBULATORY_CARE_PROVIDER_SITE_OTHER): Payer: Self-pay | Admitting: Vascular Surgery

## 2022-06-03 ENCOUNTER — Ambulatory Visit
Admission: RE | Admit: 2022-06-03 | Discharge: 2022-06-03 | Disposition: A | Discharge status: Home / Self Care | Payer: Medicare Other | Source: Ambulatory Visit | Attending: Internal Medicine | Admitting: Internal Medicine

## 2022-06-03 ENCOUNTER — Ambulatory Visit (INDEPENDENT_AMBULATORY_CARE_PROVIDER_SITE_OTHER): Payer: Medicare Other | Admitting: Vascular Surgery

## 2022-06-03 VITALS — BP 155/59 | HR 63 | Ht 62.0 in | Wt 192.0 lb

## 2022-06-03 DIAGNOSIS — R928 Other abnormal and inconclusive findings on diagnostic imaging of breast: Secondary | ICD-10-CM | POA: Diagnosis not present

## 2022-06-03 DIAGNOSIS — R921 Mammographic calcification found on diagnostic imaging of breast: Secondary | ICD-10-CM | POA: Diagnosis present

## 2022-06-03 DIAGNOSIS — N186 End stage renal disease: Secondary | ICD-10-CM

## 2022-06-03 DIAGNOSIS — E1122 Type 2 diabetes mellitus with diabetic chronic kidney disease: Secondary | ICD-10-CM | POA: Diagnosis not present

## 2022-06-03 DIAGNOSIS — I10 Essential (primary) hypertension: Secondary | ICD-10-CM | POA: Diagnosis not present

## 2022-06-03 DIAGNOSIS — Z992 Dependence on renal dialysis: Secondary | ICD-10-CM

## 2022-06-03 NOTE — Assessment & Plan Note (Signed)
Her duplex today shows a widely patent left radiocephalic AV fistula with normal flow velocities and no areas of stenosis.  The most important thing for keeping her fistula durable and patent long-term will be rotating her access sites.  Return to clinic in 6 months with follow-up duplex.

## 2022-06-03 NOTE — Progress Notes (Signed)
MRN : [id number]  Nina Johnston is a 62 y.o. (1960-07-18) female who presents with chief complaint of  Chief Complaint  Patient presents with   Follow-up  .  History of Present Illness: Patient returns today in follow up of her dialysis access.  They have been having a little bit of prolonged bleeding at times with her dialysis treatments.  This is generally with a stick her aneurysmal area that is by far and away the most frequent area at the access for dialysis.  She says that it is 1 tech there who rotates the access sticks, but by and large they stick the same 2 areas and those areas do tend to bleed.  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  There is no current skin threat or open wound although the areas that are repetitively stuck clearly have thinning skin.  Her duplex today shows a widely patent left radiocephalic AV fistula with normal flow velocities and no areas of stenosis.  Current Outpatient Medications  Medication Sig Dispense Refill   b complex vitamins tablet Take 1 tablet by mouth at bedtime.      B Complex-C-Folic Acid (DIALYVITE TABLET) TABS Take 1 tablet by mouth daily.  12   benzonatate (TESSALON) 200 MG capsule Take by mouth.     clonazePAM (KLONOPIN) 1 MG tablet Take 1-2 tablets (1-2 mg total) by mouth daily. 90 tablet 3   cloNIDine (CATAPRES - DOSED IN MG/24 HR) 0.2 mg/24hr patch Place 1 patch onto the skin once a week.     Continuous Blood Gluc Sensor (DEXCOM G6 SENSOR) MISC Use 1 every 10 days     Continuous Blood Gluc Transmit (DEXCOM G6 TRANSMITTER) MISC USE EVERY 3 MONTHS     ethosuximide (ZARONTIN) 250 MG/5ML solution TAKE 5 ML BY MOUTH TWICE A DAY 473 mL 3   famotidine (PEPCID) 40 MG tablet SMARTSIG:1 Tablet(s) By Mouth Every Evening     Febuxostat 80 MG TABS Take 80 mg by mouth daily.      fexofenadine (ALLEGRA) 180 MG tablet Take 180 mg by mouth daily.      Fluticasone-Salmeterol (ADVAIR) 250-50 MCG/DOSE AEPB Inhale 1 puff into the lungs 2 (two) times  daily.     furosemide (LASIX) 20 MG tablet Take 30 mg by mouth 2 (two) times daily.      hydrALAZINE (APRESOLINE) 50 MG tablet Take 100 mg by mouth 2 (two) times daily.     HYDROcodone-acetaminophen (NORCO/VICODIN) 5-325 MG tablet Take 1 tablet by mouth every 6 (Johnston) hours as needed.      insulin glargine (LANTUS) 100 UNIT/ML injection Inject 22 Units into the skin at bedtime. 16 units on Monday,Wednesday,Friday, 12 units on dialysis days     insulin lispro (HUMALOG) 100 UNIT/ML injection Inject 8-15 Units into the skin 3 (three) times daily with meals. Per sliding scale     LAMICTAL 200 MG tablet Take 1.5 tablets (300 mg total) by mouth 2 (two) times daily. GLAXO BRAND NAME 270 tablet 1   levETIRAcetam (KEPPRA) 250 MG tablet Take Bid on non -dialysis days, 3 a day on hemodialysis days. 270 tablet 5   levothyroxine (SYNTHROID) 150 MCG tablet Take 150 mcg by mouth daily before breakfast.      lidocaine-prilocaine (EMLA) cream Apply topically as directed.     Methoxy PEG-Epoetin Beta (MIRCERA IJ) Inject as directed. Receives w/ dialysis, unsure how often right now     metoprolol succinate (TOPROL XL) 50 MG 24 hr tablet Take 1  tablet (50 mg total) by mouth 2 (two) times daily. Take with or immediately following a meal. (Patient taking differently: Take 75 mg by mouth 2 (two) times daily.) 60 tablet 11   montelukast (SINGULAIR) 10 MG tablet SMARTSIG:1 Tablet(s) By Mouth Every Evening     nystatin-triamcinolone (MYCOLOG II) cream Apply 1 application topically 2 (two) times daily. (Patient taking differently: Apply 1 application  topically daily as needed (rash).) 30 g 1   pantoprazole (PROTONIX) 40 MG tablet Take 40 mg by mouth every morning.      Probiotic Product (ALIGN) 4 MG CAPS Take 4 mg by mouth daily.      sevelamer carbonate (RENVELA) 800 MG tablet Take 3,200 mg by mouth daily.     triamcinolone (NASACORT) 55 MCG/ACT AERO nasal inhaler Place 2 sprays into the nose at bedtime.      UNIFINE  PENTIPS 32G X 4 MM MISC USE UP TO 5 TIMES DAILY AS DIRECTED  3   valsartan (DIOVAN) 160 MG tablet Take 160 mg by mouth 2 (two) times daily.     No current facility-administered medications for this visit.    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  On multiple psychotropic medications, and with DM>    Benign essential HTN 12/26/2014   Bipolar disorder ([place])    Bronchitis    Carotid artery narrowing 12/22/2014   Cervical prolapse    CHF (congestive heart failure) (HCC)    CKD (chronic kidney disease)    Concussion 01-14-14   Fall    COPD (chronic obstructive pulmonary disease) ([place])    Coronary artery disease    Depression    Diabetes mellitus without complication ([place])    Dialysis patient ([place])    Diplopia    Dupuytren's contracture of foot    Essential (primary) hypertension 02/12/2015   Family history of thyroid problem    GERD (gastroesophageal reflux disease)    Gout    Heart disease    enlarged because of COPD   Heart murmur    Hepatomegaly    HLD (hyperlipidemia)    Hyperactive airway disease    Hyperkalemia    Hypertension    Hyperthyroidism    Irritable colon    Morbid obesity ([place])    Multiple thyroid nodules    OAB (overactive bladder)    Pernicious anemia    Post menopausal syndrome    Post-concussion syndrome 03/08/2014   Post-traumatic brain syndrome    Prolapsed uterus    PSVT (paroxysmal supraventricular tachycardia)    Renal calculus    Renal disease    w/ GFR 29-may be due to diabetes   Seizures ([place])    last seizure 2012. has had spells since with no knowledge. 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    Transient alteration of awareness 11/10/2012    Urinary incontinence with continuous leakage 03/08/2014    Past Surgical History:  Procedure Laterality Date   A/V FISTULAGRAM Left 07/21/2016   Procedure: A/V Fistulagram;  Surgeon: Algernon Huxley, MD;  Location: [facility];  Service: Cardiovascular;  Laterality:  Left;   A/V FISTULAGRAM Left 12/24/2016   Procedure: A/V Fistulagram;  Surgeon: Algernon Huxley, MD;  Location: [facility];  Service: Cardiovascular;  Laterality: Left;   A/V FISTULAGRAM Left 06/29/2017   Procedure: A/V FISTULAGRAM;  Surgeon: Algernon Huxley, MD;  Location: [facility];  Service: Cardiovascular;  Laterality: Left;   A/V FISTULAGRAM Left 03/08/2018   Procedure: A/V FISTULAGRAM;  Surgeon: Algernon Huxley, MD;  Location: [facility];  Service: Cardiovascular;  Laterality: Left;   A/V FISTULAGRAM Left 04/11/2019   Procedure: A/V FISTULAGRAM;  Surgeon: Algernon Huxley, MD;  Location: [facility];  Service: Cardiovascular;  Laterality: Left;   A/V SHUNT INTERVENTION N/A 07/21/2016   Procedure: A/V Shunt Intervention;  Surgeon: Algernon Huxley, MD;  Location: [facility];  Service: Cardiovascular;  Laterality: N/A;   A/V SHUNT INTERVENTION N/A 12/24/2016   Procedure: A/V SHUNT INTERVENTION;  Surgeon: Algernon Huxley, MD;  Location: [facility];  Service: Cardiovascular;  Laterality: N/A;   AVF     CHOLECYSTECTOMY     COLONOSCOPY     COLONOSCOPY WITH PROPOFOL N/A 03/18/2017   Procedure: COLONOSCOPY WITH PROPOFOL;  Surgeon: Manya Silvas, MD;  Location: [facility];  Service: Endoscopy;  Laterality: N/A;   ENDOMETRIAL BIOPSY     ablation, uterine   ESOPHAGOGASTRODUODENOSCOPY (EGD) WITH PROPOFOL N/A 03/18/2017   Procedure: ESOPHAGOGASTRODUODENOSCOPY (EGD) WITH PROPOFOL;  Surgeon: Manya Silvas, MD;  Location: [facility];  Service: Endoscopy;  Laterality: N/A;   HERNIA REPAIR     HERNIA REPAIR     PERIPHERAL VASCULAR CATHETERIZATION N/A 08/23/2015   Procedure:  Dialysis/Perma Catheter Insertion;  Surgeon: Algernon Huxley, MD;  Location: [facility];  Service: Cardiovascular;  Laterality: N/A;   PERIPHERAL VASCULAR CATHETERIZATION N/A 11/09/2015   Procedure: Dialysis/Perma Catheter Removal;  Surgeon: Katha Cabal, MD;  Location: [facility];  Service: Cardiovascular;  Laterality: N/A;   PERIPHERAL VASCULAR CATHETERIZATION Left 04/23/2016   Procedure: A/V Fistulagram;  Surgeon: Algernon Huxley, MD;  Location: [facility];  Service: Cardiovascular;  Laterality: Left;   PORT A CATH REVISION       Social History   Tobacco Use   Smoking status: Never   Smokeless tobacco: Never  Vaping Use   Vaping Use: Never used  Substance Use Topics   Alcohol use: No   Drug use: No      Family History  Problem Relation Age of Onset   Diabetes Mother    Lumbar disc disease Mother    Migraines Mother    Hypertension Mother    Cancer - Prostate Father        mild   Diabetes Father    Hypertension Father    Breast cancer Neg Hx    Ovarian cancer Neg Hx    Colon cancer Neg Hx    Heart disease Neg Hx    Kidney cancer Neg Hx    Bladder Cancer Neg Hx      Allergies  Allergen Reactions   Cinnamon Other (See Comments)    Raises blood pressure   Procrit [Epoetin Alfa-Epbx] Other (See Comments)    Seizures, grand mal   Ace Inhibitors Cough   Azithromycin Other (See Comments)    seizures   Iron Other (See Comments)    Can not take in IV Form - causes seizures    Neomycin-Bacitracin Zn-Polymyx Other (See Comments)    Unknown  Other    Sulfa Antibiotics Other (See Comments)    Due to risks for seizures.    REVIEW OF SYSTEMS (Negative unless checked)   Constitutional: [] Weight loss  [] Fever  [] Chills Cardiac: [] Chest pain   [] Chest pressure   [] Palpitations   [] Shortness of breath when laying flat   [] Shortness of breath at rest   [] Shortness of breath with exertion. Vascular:  [] Pain in legs with walking   [] Pain in legs  at rest   [] Pain in legs when laying flat   [] Claudication   [] Pain in feet when walking  [] Pain in feet at rest  [] Pain in feet when laying flat   [] History of DVT   [] Phlebitis   [x] Swelling in legs   [] Varicose veins   [] Non-healing ulcers Pulmonary:   [] Uses home oxygen   [] Productive cough   [] Hemoptysis   [] Wheeze  [] COPD   [] Asthma Neurologic:  [] Dizziness  [] Blackouts   [] Seizures   [] History of stroke   [] History of TIA  [] Aphasia   [] Temporary blindness   [] Dysphagia   [] Weakness or numbness in arms   [] Weakness or numbness in legs Musculoskeletal:  [] Arthritis   [] Joint swelling   [] Joint pain   [] Low back pain Hematologic:  [x] Easy bruising  [x] Easy bleeding   [] Hypercoagulable state   [x] Anemic   Gastrointestinal:  [] Blood in stool   [] Vomiting blood  [] Gastroesophageal reflux/heartburn   [] Abdominal pain Genitourinary:  [x] Chronic kidney disease   [] Difficult urination  [] Frequent urination  [] Burning with urination   [] Hematuria Skin:  [] Rashes   [] Ulcers   [] Wounds Psychological:  [] History of anxiety   []  History of major depression.  Physical Examination  BP (!) 155/59 (BP Location: Right Arm)   Pulse 63   Ht 5\' 2"  (1.575 m)   Wt 192 lb (87.1 kg)   BMI 35.12 kg/m  Gen:  WD/WN, NAD Head: [place]/AT, No temporalis wasting. Ear/Nose/Throat: Hearing grossly intact, nares w/o erythema or drainage Eyes: Conjunctiva clear. Sclera non-icteric Neck: Supple.  Trachea midline Pulmonary:  Good air movement, no use of accessory muscles.  Cardiac: RRR, no JVD Vascular: moderately aneurysmal left radiocephalic AVF with good thrill Vessel Right Left  Radial Palpable Palpable           Musculoskeletal: M/S 5/5 throughout.  No deformity or atrophy. No edema. Neurologic: Sensation grossly intact in extremities.  Symmetrical.  Speech is fluent.  Psychiatric: Judgment intact, Mood & affect appropriate for pt's clinical situation. Dermatologic: No rashes or ulcers noted.  No cellulitis or  open wounds.      Labs Recent Results (from the past 2160 hour(s))  CBG monitoring, ED     Status: Abnormal   Collection Time: 03/17/22  3:29 PM  Result Value Ref Range   Glucose-Capillary 317 (H) 70 - 99 mg/dL    Comment: Glucose reference range applies only to samples taken after fasting for at least 8 hours.  Comprehensive metabolic panel     Status: Abnormal   Collection Time: 03/17/22  3:37 PM  Result Value Ref Range   Sodium 139 135 - 145 mmol/L   Potassium 3.0 (L) 3.5 - 5.1 mmol/L   Chloride 99 98 - 111 mmol/L   CO2 27 22 - 32 mmol/L   Glucose, Bld 337 (H) 70 - 99 mg/dL    Comment: Glucose reference range applies only to samples taken after fasting for at least 8 hours.   BUN 18 8 - 23 mg/dL   Creatinine, Ser 3.68 (H) 0.44 -  1.00 mg/dL   Calcium 8.3 (L) 8.9 - 10.3 mg/dL   Total Protein 6.7 6.5 - 8.1 g/dL   Albumin 3.8 3.5 - 5.0 g/dL   AST 23 15 - 41 U/L   ALT 11 0 - 44 U/L   Alkaline Phosphatase 157 (H) 38 - 126 U/L   Total Bilirubin 0.9 0.3 - 1.2 mg/dL   GFR, Estimated 13 (L) >60 mL/min    Comment: (NOTE) Calculated using the CKD-EPI Creatinine Equation (2021)    Anion gap 13 5 - 15    Comment: Performed at [facility], [place], [place] [address]  Troponin I (High Sensitivity)     Status: None   Collection Time: 03/17/22  3:37 PM  Result Value Ref Range   Troponin I (High Sensitivity) 9 <18 ng/L    Comment: (NOTE) Elevated high sensitivity troponin I (hsTnI) values and significant  changes across serial measurements may suggest ACS but many other  chronic and acute conditions are known to elevate hsTnI results.  Refer to the "Links" section for chest pain algorithms and additional  guidance. Performed at [facility], [address] [place], [place] [address]   CBC with Differential     Status: Abnormal   Collection Time: 03/17/22  3:37 PM  Result Value Ref Range   WBC 8.6 4.0 - 10.5 K/uL   RBC 3.20 (L) 3.87 - 5.11  MIL/uL   Hemoglobin 11.0 (L) 12.0 - 15.0 g/dL   HCT 34.3 (L) 36.0 - 46.0 %   MCV 107.2 (H) 80.0 - 100.0 fL   MCH 34.4 (H) 26.0 - 34.0 pg   MCHC 32.1 30.0 - 36.0 g/dL   RDW 14.6 11.5 - 15.5 %   Platelets 112 (L) 150 - 400 K/uL   nRBC 0.0 0.0 - 0.2 %   Neutrophils Relative % 78 %   Neutro Abs 6.7 1.7 - 7.7 K/uL   Lymphocytes Relative 14 %   Lymphs Abs 1.2 0.7 - 4.0 K/uL   Monocytes Relative 5 %   Monocytes Absolute 0.4 0.1 - 1.0 K/uL   Eosinophils Relative 1 %   Eosinophils Absolute 0.1 0.0 - 0.5 K/uL   Basophils Relative 1 %   Basophils Absolute 0.1 0.0 - 0.1 K/uL   Immature Granulocytes 1 %   Abs Immature Granulocytes 0.10 (H) 0.00 - 0.07 K/uL    Comment: Performed at [facility], [address], [place], [place] [address]  Brain natriuretic peptide     Status: Abnormal   Collection Time: 03/17/22  3:37 PM  Result Value Ref Range   B Natriuretic Peptide 691.1 (H) 0.0 - 100.0 pg/mL    Comment: Performed at [facility], [address] [place],  [address]  CBG monitoring, ED     Status: Abnormal   Collection Time: 03/17/22  5:47 PM  Result Value Ref Range   Glucose-Capillary 269 (H) 70 - 99 mg/dL    Comment: Glucose reference range applies only to samples taken after fasting for at least 8 hours.  Troponin I (High Sensitivity)     Status: Abnormal   Collection Time: 03/17/22  5:48 PM  Result Value Ref Range   Troponin I (High Sensitivity) 19 (H) <18 ng/L    Comment: (NOTE) Elevated high sensitivity troponin I (hsTnI) values and significant  changes across serial measurements may suggest ACS but many other  chronic and acute conditions are known to elevate hsTnI results.  Refer to the "Links" section  for chest pain algorithms and additional  guidance. Performed at [facility], [address], [place], [place] [address]     Radiology MM 3D SCREEN BREAST BILATERAL  Result Date: 05/23/2022 CLINICAL DATA:  Screening. EXAM: DIGITAL  SCREENING BILATERAL MAMMOGRAM WITH TOMOSYNTHESIS AND CAD TECHNIQUE: Bilateral screening digital craniocaudal and mediolateral oblique mammograms were obtained. Bilateral screening digital breast tomosynthesis was performed. The images were evaluated with computer-aided detection. COMPARISON:  Previous exam(s). ACR Breast Density Category b: There are scattered areas of fibroglandular density. FINDINGS: In the left breast, calcifications warrant further evaluation. In the right breast, no findings suspicious for malignancy. IMPRESSION: Further evaluation is suggested for calcifications in the left breast. RECOMMENDATION: Diagnostic mammogram of the left breast. (Code:FI-[id number]) The patient will be contacted regarding the findings, and additional imaging will be scheduled. BI-RADS CATEGORY  0: Incomplete. Need additional imaging evaluation and/or prior mammograms for comparison. Electronically Signed   By: Ammie Ferrier M.D.   On: 05/23/2022 12:23    Assessment/Plan Type 2 diabetes mellitus (HCC) blood glucose control important in reducing the progression of atherosclerotic disease. Also, involved in wound healing. On appropriate medications.     Essential (primary) hypertension blood pressure control important in reducing the progression of atherosclerotic disease. On appropriate oral medications.  End-stage renal disease on hemodialysis ([place]) Her duplex today shows a widely patent left radiocephalic AV fistula with normal flow velocities and no areas of stenosis.  The most important thing for keeping her fistula durable and patent long-term will be rotating her access sites.  Return to clinic in 6 months with follow-up duplex.    Leotis Pain, MD  06/03/2022 1:07 PM    This note was created with Dragon medical transcription system.  Any errors from dictation are purely unintentional

## 2022-06-05 ENCOUNTER — Encounter (INDEPENDENT_AMBULATORY_CARE_PROVIDER_SITE_OTHER): Payer: Medicare Other

## 2022-06-05 ENCOUNTER — Ambulatory Visit (INDEPENDENT_AMBULATORY_CARE_PROVIDER_SITE_OTHER): Payer: Medicare Other | Admitting: Nurse Practitioner

## 2022-06-08 ENCOUNTER — Ambulatory Visit (INDEPENDENT_AMBULATORY_CARE_PROVIDER_SITE_OTHER): Payer: Medicare Other | Admitting: Neurology

## 2022-06-08 DIAGNOSIS — R404 Transient alteration of awareness: Secondary | ICD-10-CM

## 2022-06-08 DIAGNOSIS — G478 Other sleep disorders: Secondary | ICD-10-CM

## 2022-06-08 DIAGNOSIS — E161 Other hypoglycemia: Secondary | ICD-10-CM

## 2022-06-08 DIAGNOSIS — Z992 Dependence on renal dialysis: Secondary | ICD-10-CM

## 2022-06-08 DIAGNOSIS — E11649 Type 2 diabetes mellitus with hypoglycemia without coma: Secondary | ICD-10-CM

## 2022-06-08 DIAGNOSIS — R4789 Other speech disturbances: Secondary | ICD-10-CM

## 2022-06-08 DIAGNOSIS — Z9989 Dependence on other enabling machines and devices: Secondary | ICD-10-CM

## 2022-06-08 DIAGNOSIS — Z9981 Dependence on supplemental oxygen: Secondary | ICD-10-CM

## 2022-06-09 ENCOUNTER — Other Ambulatory Visit: Payer: Self-pay | Admitting: Internal Medicine

## 2022-06-09 DIAGNOSIS — R921 Mammographic calcification found on diagnostic imaging of breast: Secondary | ICD-10-CM

## 2022-06-09 DIAGNOSIS — R928 Other abnormal and inconclusive findings on diagnostic imaging of breast: Secondary | ICD-10-CM

## 2022-06-12 ENCOUNTER — Ambulatory Visit
Admission: RE | Admit: 2022-06-12 | Discharge: 2022-06-12 | Disposition: A | Discharge status: Home / Self Care | Payer: Medicare Other | Source: Ambulatory Visit | Attending: Internal Medicine | Admitting: Internal Medicine

## 2022-06-12 DIAGNOSIS — R921 Mammographic calcification found on diagnostic imaging of breast: Secondary | ICD-10-CM | POA: Insufficient documentation

## 2022-06-12 DIAGNOSIS — R928 Other abnormal and inconclusive findings on diagnostic imaging of breast: Secondary | ICD-10-CM

## 2022-06-12 HISTORY — PX: BREAST BIOPSY: SHX20

## 2022-06-12 MED ORDER — LIDOCAINE-EPINEPHRINE 1 %-1:100000 IJ SOLN
20.0000 mL | Freq: Once | INTRAMUSCULAR | Status: AC
  Administered 2022-06-12: 20 mL
  Filled 2022-06-12: qty 20

## 2022-06-12 MED ORDER — LIDOCAINE HCL (PF) 1 % IJ SOLN
5.0000 mL | Freq: Once | INTRAMUSCULAR | Status: AC
  Administered 2022-06-12: 5 mL
  Filled 2022-06-12: qty 5

## 2022-06-13 LAB — SURGICAL PATHOLOGY

## 2022-06-13 NOTE — Procedures (Signed)
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 reports nocturnal stool incontinence. She is using CPAP and oxygen at home. The study was to be scheduled after a non-dialysis day.The patient could reportedly not sleep supine.  ADDITIONAL INFORMATION:  The Epworth Sleepiness Scale was endorsed at 10 /24 points (scores above or equal to 10 are suggestive of hypersomnolence). FSS endorsed at   /63 points.  Height: 62 in Weight: 192 lbs (BMI 35) Neck Size: 16 in  MEDICATIONS: Norvasc, B complex, Dialyvite, Tessalon, Klonopin, Clonidine, Zarontin, Pepcid, Febuxostat, Allegra, Advair, Lasix, Apresoline, Vicodin, Lantus, Humalog, Lamictal, Keppra, Synthroid, lidocaine-prilocaine, Mircera, Toprol - XL, Singulair, Mycolog ll, Protonix, Align, Renova, Nasacort, Diovan  TECHNICAL DESCRIPTION: A registered sleep technologist ( RPSGT)  was in attendance for the duration of the recording.  Data collection, scoring, video monitoring, and reporting were performed in compliance with the AASM Manual for the Scoring of Sleep and Associated Events; (Hypopnea is scored based on the criteria listed in Section VIII D. 1b in the AASM Manual V2.6 using a 4% oxygen desaturation rule or Hypopnea is scored  based on the criteria listed in Section VIII D. 1a in the AASM Manual V2.6 using 3% oxygen desaturation and /or arousal rule).   SLEEP CONTINUITY AND SLEEP ARCHITECTURE:  Lights-out was at 21:59: and lights-on at  05:05:, with  7.1 hours ( 446 minutes)  of recording time . Total sleep time ( TST) was 353.0 minutes with a decreased sleep efficiency at 83.0%.  BODY POSITION:  TST was divided between the following sleep positions: 23.5% supine; 76.5% lateral right sided only and 0% prone.  Sleep latency was increased at 62.0 minutes.  No REM sleep latency= There was no REM sleep. Of the total sleep time, the percentage of stage N1 sleep was 6.4%, stage N2 sleep was 73%, stage N3 sleep was 21.1%, and REM sleep was 0.0%.  8 awakenings (i.e. transitions to Stage W from any sleep stage), and 45 total stage transitions. Wake after sleep onset (WASO) time accounted for 10.5 minutes .   RESPIRATORY MONITORING:   Based on CMS criteria (using a 4% oxygen desaturation rule for scoring hypopneas), there were 0 apneas (0 obstructive; 0 central; 0 mixed), and 2 hypopneas.   Apnea index was 0.0/h. Hypopnea index was 0.3/h. The AHI/ apnea-hypopnea index was 0.3/h overall (0.7 supine, 0 non-supine; 0.0 REM, 0.0 supine REM). OXIMETRY: Oxyhemoglobin Saturation Nadir during sleep was at  85%) from a mean of 96%.  Of the Total sleep time (TST) there were 20.8 minutes in  hypoxemia (<89%), or 5.9% of total sleep time. The time in hypoxemia was evident in the early hours of the sleep study and oxygen had to be supplemented at 2 liters/min. see graphic  display  LIMB MOVEMENTS: There were 0 periodic limb movements of sleep. AROUSAL: There were 33 arousals in total, for an arousal index of 6 arousals/hour.  Of these, 1 were identified as respiratory-related arousals (0 /h), 0 were PLM-related arousals (0 /h), and 38 were non-specific arousals (6 /h).There were 0 occurrences of Cheyne Stokes breathing.   EEG:  PSG EEG was of  reduced amplitude and reduced background frequency. EKG: The electrocardiogram documented a regular rhythm.  The heart rate during sleep varied between a minimum of 55 and  a maximum of  99 bpm. Mean HR was 64 bpm.  AUDIO and VIDEO: no complex movements, vocalizations or enactment was captured.   IMPRESSION: 1) NO APNEA was found- This is surprising in a patient who has been often fluid overloaded in the past.  This study was to take place on a non-dialysis day to capture the respiratory burden of fluid load but Sleep disordered breathing was not present but oxygen desaturation was severe enough ( more than 5 minutes consecutively in desaturation below 88% 02)  to supplement oxygen as the patient uses at home.     2) no PLMs.  RECOMMENDATIONS: Based on these data  there will be no indication for PAP therapy in this patient. She clearly needs to continue oxygen supplementation though.   Larey Seat, MD Medical director of [facility],  Fellow of the AASM and AAN, board certified in Neurology and Sleep Medicine.              General Information  Name: Nina, Johnston BMI: 35.12 Physician: Larey Seat, MD  ID: [id number] Height: 62.0 in Technician: Richard Miu, RPSGT  Sex: Female Weight: 192.0 lb Record: [id number]  Age: 62 [08/14/1960] Date: 06/08/2022    Medical & Medication History    Seizure like episodes have stopped. - chief complaint. [name] is a 62 y.o. female here for a RV : No spells since last visit in January 2023. 02-12-2022: Ms. Addison Lank states that she has stool incontinence and she may have partial or nocturnal seizures that may contribute to this to the incontinence event. She is not aware of her stool incontinence. Wakes after the fact. She has had some soft -liquid stool in daytime . In her interval history she stated that she had the flu and was diagnosed on August 9 with a fever her parents also acquired the infection , she was seen in  hospital and diagnosed with viral pneumonia she was given steroids and hydrocodone. Her father was hospitalized for a longer period of time and her brother stayed with the father during that time. She also had "Pinkeye" which her physicians identified is related to her viral infect. She was treated with steroids. She is on hemodialysis. She may have lost fluid with diarrhea, her dry weight had to be adjusted. She remains on supplemental oxygen and CPAP at home, needs a newer machine. She lost 35 pounds, had crackles in her lung, now cleared up. She had cardiomegaly and pulmonary vascular congestion. She has had an older CPAP and could be replaced.  Norvasc, B complex, Dialyvite, TEssalon, Klonopin, Clonidine, Zarontin, Pepcid, Debuxostat, Allegra, Advair, Lasix, Apresoline, Vicodin, Lantus, Humalog, Lamictal, Keppra, Synthroid, lidocaine-prilocaine, Mircera, Toprol - XL, Singulair, Mycolog ll, Protonix, Align, Renvela, Nasacort, Diovan   Sleep Disorder      Comments   The patient came into the lab for a SPLIT. The patient was not split due to AHI being less than 10 after 2 hours  of total sleep time per MD's order. The patient had prior sleep studies in 2021 with another sleep lab. The patient was started on oxygen due to sats being below 88% for more than five minutes. O2 was initiated at 2/lpm as this is the patients at home level. The patient had no restroom breaks. EKG kept in NSR. No snoring. All sleep stages witnessed. Respiratory events scored with a 4% desat. Very few respiratory events observed. Slept lateral and supine. The patient did not spend too much time supine. Per pt she is unable to sleep supine. No REM sleep.    Lights out: 09:59:50 PM Lights on: 05:05:30 AM   Time Total Supine Side Prone Upright  Recording (TRT) 7h 5.41m2h 21.572mh 44.41m49m 0.41m 27m0.41m  28mep (TST) 5h 53.41m 1h56m.41m 4h 78m41m 0h 082m 0h 0.641m  Late42m N1 N2 N3 REM Onset Per. Slp. Eff.  Actual 1h 2.41m 1h 4.41m63m 19.41m55mh 0.41m 74m2.41m 1h45m41m 82.47m   Stg61mr Wake N1 N2 N3 REM  Total 10.5 22.5 256.0 74.5 0.0  Supine 1.5 11.0 72.0 0.0 0.0  Side 9.0 11.5 184.0 74.5 0.0  Prone 0.0 0.0 0.0 0.0 0.0  Upright 0.0 0.0 0.0 0.0 0.0   Stg % Wake N1 N2 N3 REM  Total 2.9 6.4 72.5 21.1 0.0  Supine 0.4 3.1 20.4 0.0 0.0  Side 2.5 3.3 52.1 21.1 0.0  Prone 0.0 0.0 0.0 0.0 0.0  Upright 0.0 0.0 0.0 0.0 0.0     Apnea Summary Sub Supine Side Prone Upright  Total 0 Total 0 0 0 0 0    REM 0 0 0 0 0    NREM 0 0 0 0 0  Obs 0 REM 0 0 0 0 0    NREM 0 0 0 0 0  Mix 0 REM 0 0 0 0 0    NREM 0 0 0 0 0  Cen 0 REM 0 0 0 0 0    NREM 0 0 0 0 0   Rera Summary Sub Supine Side Prone Upright  Total 0 Total 0 0 0 0 0    REM 0 0 0 0 0    NREM 0 0 0 0 0   Hypopnea Summary Sub Supine Side Prone Upright  Total 2 Total 2 1 1 $ 0 0    REM 0 0 0 0 0    NREM 2 1 1 $ 0 0   4% Hypopnea Summary Sub Supine Side Prone Upright  Total (4%) 2 Total 2 1 1 $ 0 0    REM 0 0 0 0 0    NREM 2 1 1 $ 0 0     AHI Total Obs Mix Cen  0.34 Apnea 0.00 0.00 0.00 0.00   Hypopnea 0.34 -- -- --  0.34 Hypopnea (4%) 0.34 -- -- --    Total Supine Side Prone Upright  Position AHI 0.34 0.72 0.22 0.00 0.00  REM AHI 0.00   NREM AHI 0.34   Position RDI 0.34 0.72 0.22 0.00 0.00  REM RDI 0.00   NREM RDI 0.34    4% Hypopnea Total Supine Side Prone Upright  Position AHI (4%) 0.34 0.72 0.22 0.00 0.00  REM AHI (4%) 0.00   NREM AHI (4%) 0.34   Position RDI (4%) 0.34 0.72 0.22 0.00 0.00  REM RDI (4%) 0.00   NREM RDI (4%) 0.34    Desaturation Information Threshold: 2% <100% <90% <80% <70% <60% <  50% <40%  Supine 18.0 0.0 0.0 0.0 0.0 0.0 0.0  Side 11.0 5.0 0.0 0.0 0.0 0.0 0.0  Prone 0.0 0.0 0.0 0.0 0.0 0.0 0.0  Upright 0.0 0.0 0.0 0.0 0.0 0.0 0.0  Total 29.0 5.0 0.0 0.0 0.0 0.0 0.0  Index 4.8 0.8 0.0 0.0 0.0 0.0 0.0   Threshold: 3% <100% <90% <80% <70% <60% <50% <40%  Supine 3.0 0.0 0.0 0.0 0.0 0.0 0.0  Side 1.0 1.0 0.0 0.0 0.0 0.0 0.0  Prone 0.0 0.0 0.0  0.0 0.0 0.0 0.0  Upright 0.0 0.0 0.0 0.0 0.0 0.0 0.0  Total 4.0 1.0 0.0 0.0 0.0 0.0 0.0  Index 0.7 0.2 0.0 0.0 0.0 0.0 0.0   Threshold: 4% <100% <90% <80% <70% <60% <50% <40%  Supine 2.0 0.0 0.0 0.0 0.0 0.0 0.0  Side 1.0 1.0 0.0 0.0 0.0 0.0 0.0  Prone 0.0 0.0 0.0 0.0 0.0 0.0 0.0  Upright 0.0 0.0 0.0 0.0 0.0 0.0 0.0  Total 3.0 1.0 0.0 0.0 0.0 0.0 0.0  Index 0.5 0.2 0.0 0.0 0.0 0.0 0.0   Threshold: 4% <100% <90% <80% <70% <60% <50% <40%  Supine 2 0 0 0 0 0 0  Side 1 1 0 0 0 0 0  Prone 0 0 0 0 0 0 0  Upright 0 0 0 0 0 0 0  Total 3 1 0 0 0 0 0   Awakening/Arousal Information # of Awakenings 8  Wake after sleep onset 10.1m Wake after persistent sleep 10.571m Arousal Assoc. Arousals Index  Apneas 0 0.0  Hypopneas 1 0.2  Leg Movements 1 0.2  Snore 0 0.0  PTT Arousals 0 0.0  Spontaneous 38 6.5  Total 40 6.8  Leg Movement Information PLMS LMs Index  Total LMs during PLMS 0 0.0  LMs w/ Microarousals 0 0.0   LM LMs Index  w/ Microarousal 1 0.2  w/ Awakening 0 0.0  w/ Resp Event 0 0.0  Spontaneous 4 0.7  Total 5 0.8     Desaturation threshold setting: 4% Minimum desaturation setting: 10 seconds SaO2 nadir: 85% Sustained hypoxemia under 88% for ver 5 minutes within minutes of sleep onset, 02 was supplemented at 2 lies.  The longest event was a 21 sec obstructive Hypopnea with a minimum SaO2 of 92%. The lowest SaO2 was 86% associated with a 16 sec obstructive Hypopnea.  EKG Rates EKG Avg Max Min  Awake 70 96 58  Asleep 64 99

## 2022-06-17 ENCOUNTER — Encounter: Payer: Self-pay | Admitting: Neurology

## 2022-06-23 ENCOUNTER — Other Ambulatory Visit: Payer: Self-pay | Admitting: Neurology

## 2022-06-23 ENCOUNTER — Telehealth: Payer: Self-pay | Admitting: Neurology

## 2022-06-23 DIAGNOSIS — R4789 Other speech disturbances: Secondary | ICD-10-CM

## 2022-06-23 DIAGNOSIS — R270 Ataxia, unspecified: Secondary | ICD-10-CM

## 2022-06-23 MED ORDER — ETHOSUXIMIDE 250 MG/5ML PO SOLN: 250.0000 mg | Freq: Two times a day (BID) | ORAL | 1 refills | Status: DC

## 2022-06-23 NOTE — Telephone Encounter (Signed)
Mother is calling. Asking if pt refill for Ethosuximide can be filled today because pt is out of medication.

## 2022-06-23 NOTE — Telephone Encounter (Signed)
Refill has been sent to the pharmacy for the patient.

## 2022-06-23 NOTE — Addendum Note (Signed)
Addended by: Darleen Crocker on: 06/23/2022 02:04 PM   Modules accepted: Orders

## 2022-08-10 ENCOUNTER — Encounter: Payer: Self-pay | Admitting: Neurology

## 2022-08-12 ENCOUNTER — Ambulatory Visit (INDEPENDENT_AMBULATORY_CARE_PROVIDER_SITE_OTHER): Payer: Medicare Other | Admitting: Neurology

## 2022-08-12 ENCOUNTER — Encounter: Payer: Self-pay | Admitting: Neurology

## 2022-08-12 VITALS — BP 155/60 | HR 67 | Ht 62.0 in | Wt 194.6 lb

## 2022-08-12 DIAGNOSIS — R270 Ataxia, unspecified: Secondary | ICD-10-CM

## 2022-08-12 DIAGNOSIS — N186 End stage renal disease: Secondary | ICD-10-CM | POA: Diagnosis not present

## 2022-08-12 DIAGNOSIS — Z992 Dependence on renal dialysis: Secondary | ICD-10-CM

## 2022-08-12 DIAGNOSIS — R4789 Other speech disturbances: Secondary | ICD-10-CM

## 2022-08-12 DIAGNOSIS — R404 Transient alteration of awareness: Secondary | ICD-10-CM | POA: Diagnosis not present

## 2022-08-12 DIAGNOSIS — M85832 Other specified disorders of bone density and structure, left forearm: Secondary | ICD-10-CM

## 2022-08-12 MED ORDER — ETHOSUXIMIDE 250 MG/5ML PO SOLN: ORAL | 1 refills | Status: DC

## 2022-08-12 MED ORDER — LEVETIRACETAM 250 MG PO TABS: ORAL_TABLET | ORAL | 5 refills | Status: DC

## 2022-08-12 MED ORDER — LAMICTAL 200 MG PO TABS: 300.0000 mg | ORAL_TABLET | Freq: Two times a day (BID) | ORAL | 1 refills | Status: DC

## 2022-08-12 NOTE — Progress Notes (Signed)
.        Provider:  Melvyn Novas, MD  Primary Care Physician:  Marguarite Arbour, MD [address] [facility] [place] [address]     Referring Provider: Marguarite Arbour, Md [address] [facility] [place],  [place] [address]          Chief Complaint according to patient   Patient presents with:                HISTORY OF PRESENT ILLNESS:  Nina Johnston is a 62 y.o. female patient who is here for revisit 08/12/2022 for  follow up on HST -   Here with her mother and father, her brother would be on the phone. Patient states she would like to know what happen after her sleep study results.   Chief concern according to patient :  Do I stop CPAP ? Also, spells  that may reflect seizures. Its always on dialysis days, she has incontinence with stool and not urine- she has oliguria. These spells are happening from 4-6 Pm and following dialysis treatment.  They started when her HD time was reduced from 4 hours to 3.45 but changing the time back has not solved the problems. She is on erythropoetin again, which has been associated with seizures.   I quoted the results of her in-lab sleep study. Confirmed with patient and her family  that the study  took place on a non dialysis day, in an adjusted bed, 12-15 degrees and she was placed on 2 liters of 02.   She is free to continue CPAP if she likes, she needs to have the oxygen in any case.    Based on CMS criteria (using a 4% oxygen desaturation rule for scoring hypopneas), there were 0 apneas (0 obstructive; 0 central; 0 mixed), and 2 hypopneas.   Apnea index was 0.0/h. Hypopnea index was 0.3/h. The AHI/ apnea-hypopnea index was 0.3/h overall (0.7 supine, 0 non-supine; 0.0 REM, 0.0 supine REM). OXIMETRY: Oxyhemoglobin Saturation Nadir during sleep was at  85%) from a mean of 96%.  Of the Total sleep time (TST) there were 20.8 minutes in  hypoxemia (<89%), or 5.9% of total sleep time. The time in  hypoxemia was evident in the early hours of the sleep study and oxygen had to be supplemented at 2 liters/min. see graphic display  LIMB MOVEMENTS: There were 0 periodic limb movements of sleep. AROUSAL: There were 33 arousals in total, for an arousal index of 6 arousals/hour.  Of these, 1 were identified as respiratory-related arousals (0 /h), 0 were PLM-related arousals (0 /h), and 38 were non-specific arousals (6 /h).There were 0 occurrences of Cheyne Stokes breathing.   EEG:  PSG EEG was of reduced amplitude and reduced background frequency. EKG: The electrocardiogram documented a regular rhythm.  The heart rate during sleep varied between a minimum of 55 and  a maximum of  99 bpm. Mean HR was 64 bpm.  AUDIO and VIDEO: no complex movements, vocalizations or enactment was captured.   IMPRESSION: 1) NO APNEA was found- This is surprising in a patient who has been often fluid overloaded in the past.  This study was to take place on a non-dialysis day to capture the respiratory burden of fluid load but Sleep disordered breathing was not present but oxygen desaturation was severe enough ( more than 5 minutes consecutively in desaturation below 88% 02)  to supplement oxygen as the patient uses at home.  Review of Systems: Out of a complete 14 system review, the patient complains of only the following symptoms, and all other reviewed systems are negative.:  Fatigue, sleepiness ,   How likely are you to doze in the following situations: 0 = not likely, 1 = slight chance, 2 = moderate chance, 3 = high chance   Sitting and Reading? Watching Television? Sitting inactive in a public place (theater or meeting)? As a passenger in a car for an hour without a break? Lying down in the afternoon when circumstances permit? Sitting and talking to someone? Sitting quietly after lunch without alcohol? In a car, while stopped for a few minutes in traffic?   Total = 15/ 24 points   FSS endorsed  at 21/ 63 points.     Social History   Socioeconomic History   Marital status: Single    Spouse name: Not on file   Number of children: 0   Years of education:  college   Highest education level: HS   Occupational History   Occupation: not employed, disabled   Tobacco Use   Smoking status: Never   Smokeless tobacco: Never  Vaping Use   Vaping Use: Never used  Substance and Sexual Activity   Alcohol use: No   Drug use: No   Sexual activity: Not Currently  Other Topics Concern   Not on file  Social History Narrative   Patient is single and lives with her parents.   Patient is disabled.   Patient has a college education.   Patient is right- handed.   Patient drinks tea occasionally. 2 glasses of tea when they eat out.   Social Determinants of Health   Financial Resource Strain: Low Risk  (03/08/2018)   Overall Financial Resource Strain (CARDIA)    Difficulty of Paying Living Expenses: Not very hard  Food Insecurity: Food Insecurity Present (03/08/2018)   Hunger Vital Sign    Worried About Running Out of Food in the Last Year: Sometimes true    Ran Out of Food in the Last Year: Sometimes true  Transportation Needs: No Transportation Needs (03/08/2018)   PRAPARE - Administrator, Civil Service (Medical): No    Lack of Transportation (Non-Medical): No  Physical Activity: Unknown (03/08/2018)   Exercise Vital Sign    Days of Exercise per Week: 0 days    Minutes of Exercise per Session: Not on file  Stress: No Stress Concern Present (03/08/2018)   [employer] of Occupational Health - Occupational Stress Questionnaire    Feeling of Stress : Not at all  Social Connections: Moderately Integrated (03/08/2018)   Social Connection and Isolation Panel [NHANES]    Frequency of Communication with Friends and Family: More than three times a week    Frequency of Social Gatherings with Friends and Family: More than three times a week    Attends Religious Services: More  than 4 times per year    Active Member of [employer] or Organizations: Yes    Attends Banker Meetings: 1 to 4 times per year    Marital Status: Divorced    Family History  Problem Relation Age of Onset   Diabetes Mother    Lumbar disc disease Mother    Migraines Mother    Hypertension Mother    Cancer - Prostate Father        mild   Diabetes Father    Hypertension Father    Breast cancer Neg Hx    Ovarian cancer  Neg Hx    Colon cancer Neg Hx    Heart disease Neg Hx    Kidney cancer Neg Hx    Bladder Cancer Neg Hx     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 fistula    Acute bacterial sinusitis 04/19/2014   Adaptive colitis    Anginal pain    Anxiety    Anxiety and depression    Arthritis    Asthma    Ataxia 11/10/2012    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  On multiple psychotropic medications, and with DM>    Benign essential HTN 12/26/2014   Bipolar disorder    Bronchitis    Carotid artery narrowing 12/22/2014   Cervical prolapse    CHF (congestive heart failure)    CKD (chronic kidney disease)    Concussion 01-14-14   Fall    COPD (chronic obstructive pulmonary disease)    Coronary artery disease    Depression    Diabetes mellitus without complication    Dialysis patient    Diplopia    Dupuytren's contracture of foot    Essential (primary) hypertension 02/12/2015   Family history of thyroid problem    GERD (gastroesophageal reflux disease)    Gout    Heart disease    enlarged because of COPD   Heart murmur    Hepatomegaly    HLD (hyperlipidemia)    Hyperactive airway disease    Hyperkalemia    Hypertension    Hyperthyroidism    Irritable colon    Morbid obesity    Multiple thyroid nodules    OAB (overactive bladder)    Pernicious anemia    Post menopausal syndrome    Post-concussion syndrome 03/08/2014   Post-traumatic brain syndrome    Prolapsed uterus    PSVT (paroxysmal supraventricular tachycardia)    Renal calculus    Renal  disease    w/ GFR 29-may be due to diabetes   Seizures    last seizure 2012. has had spells since with no knowledge. 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    Transient alteration of awareness 11/10/2012   Urinary incontinence with continuous leakage 03/08/2014    Past Surgical History:  Procedure Laterality Date   A/V FISTULAGRAM Left 07/21/2016   Procedure: A/V Fistulagram;  Surgeon: Annice Needy, MD;  Location: [facility] INVASIVE CV LAB;  Service: Cardiovascular;  Laterality: Left;   A/V FISTULAGRAM Left 12/24/2016   Procedure: A/V Fistulagram;  Surgeon: Annice Needy, MD;  Location: [facility] INVASIVE CV LAB;  Service: Cardiovascular;  Laterality: Left;   A/V FISTULAGRAM Left 06/29/2017   Procedure: A/V FISTULAGRAM;  Surgeon: Annice Needy, MD;  Location: [facility] INVASIVE CV LAB;  Service: Cardiovascular;  Laterality: Left;   A/V FISTULAGRAM Left 03/08/2018   Procedure: A/V FISTULAGRAM;  Surgeon: Annice Needy, MD;  Location: [facility] INVASIVE CV LAB;  Service: Cardiovascular;  Laterality: Left;   A/V FISTULAGRAM Left 04/11/2019   Procedure: A/V FISTULAGRAM;  Surgeon: Annice Needy, MD;  Location: [facility] INVASIVE CV LAB;  Service: Cardiovascular;  Laterality: Left;   A/V SHUNT INTERVENTION N/A 07/21/2016   Procedure: A/V Shunt Intervention;  Surgeon: Annice Needy, MD;  Location: [facility] INVASIVE CV LAB;  Service: Cardiovascular;  Laterality: N/A;   A/V SHUNT INTERVENTION N/A 12/24/2016   Procedure: A/V SHUNT INTERVENTION;  Surgeon: Annice Needy, MD;  Location: [facility] INVASIVE  CV LAB;  Service: Cardiovascular;  Laterality: N/A;   AVF     BREAST BIOPSY Left 06/12/2022   LEFT stereo bx, calcs, "X" clip-path pending   BREAST BIOPSY Left 06/12/2022   MM LT BREAST BX W LOC DEV 1ST LESION IMAGE BX SPEC STEREO GUIDE 06/12/2022 [facility]   CHOLECYSTECTOMY     COLONOSCOPY     COLONOSCOPY WITH PROPOFOL N/A 03/18/2017   Procedure: COLONOSCOPY  WITH PROPOFOL;  Surgeon: Scot JunElliott, Robert T, MD;  [facility];  Service: Endoscopy;  Laterality: N/A;   ENDOMETRIAL BIOPSY     ablation, uterine   ESOPHAGOGASTRODUODENOSCOPY (EGD) WITH PROPOFOL N/A 03/18/2017   Procedure: ESOPHAGOGASTRODUODENOSCOPY (EGD) WITH PROPOFOL;  Surgeon: Scot JunElliott, Robert T, MD;  [facility];  Service: Endoscopy;  Laterality: N/A;   HERNIA REPAIR     HERNIA REPAIR     PERIPHERAL VASCULAR CATHETERIZATION N/A 08/23/2015   Procedure: Dialysis/Perma Catheter Insertion;  Surgeon: Annice NeedyJason S Dew, MD;  Location: [facility] INVASIVE CV LAB;  Service: Cardiovascular;  Laterality: N/A;   PERIPHERAL VASCULAR CATHETERIZATION N/A 11/09/2015   Procedure: Dialysis/Perma Catheter Removal;  Surgeon: Renford DillsGregory G Schnier, MD;  Location: [facility] INVASIVE CV LAB;  Service: Cardiovascular;  Laterality: N/A;   PERIPHERAL VASCULAR CATHETERIZATION Left 04/23/2016   Procedure: A/V Fistulagram;  Surgeon: Annice NeedyJason S Dew, MD;  Location: [facility] INVASIVE CV LAB;  Service: Cardiovascular;  Laterality: Left;   PORT A CATH REVISION       Current Outpatient Medications on File Prior to Visit  Medication Sig Dispense Refill   b complex vitamins tablet Take 1 tablet by mouth at bedtime.      B Complex-C-Folic Acid (DIALYVITE TABLET) TABS Take 1 tablet by mouth daily.  12   benzonatate (TESSALON) 200 MG capsule Take by mouth.     clonazePAM (KLONOPIN) 1 MG tablet Take 1-2 tablets (1-2 mg total) by mouth daily. 90 tablet 3   cloNIDine (CATAPRES - DOSED IN MG/24 HR) 0.2 mg/24hr patch Place 1 patch onto the skin once a week.     Continuous Blood Gluc Sensor (DEXCOM G6 SENSOR) MISC Use 1 every 10 days     Continuous Blood Gluc Transmit (DEXCOM G6 TRANSMITTER) MISC USE EVERY 3 MONTHS     ethosuximide (ZARONTIN) 250 MG/5ML solution Take 5 mLs (250 mg total) by mouth 2 (two) times daily. TAKE 5 ML BY MOUTH TWICE A DAY (Patient taking differently: Take 75 mg by mouth 2 (two) times daily. TAKE 5 ML BY MOUTH  TWICE A DAY) 900 mL 1   famotidine (PEPCID) 40 MG tablet SMARTSIG:1 Tablet(s) By Mouth Every Evening     Febuxostat 80 MG TABS Take 80 mg by mouth daily.      fexofenadine (ALLEGRA) 180 MG tablet Take 180 mg by mouth daily.      Fluticasone-Salmeterol (ADVAIR) 250-50 MCG/DOSE AEPB Inhale 1 puff into the lungs 2 (two) times daily.     furosemide (LASIX) 20 MG tablet Take 30 mg by mouth 2 (two) times daily.      hydrALAZINE (APRESOLINE) 50 MG tablet Take 100 mg by mouth 2 (two) times daily.     HYDROcodone-acetaminophen (NORCO/VICODIN) 5-325 MG tablet Take 1 tablet by mouth every 6 (six) hours as needed.      insulin glargine (LANTUS) 100 UNIT/ML injection Inject 22 Units into the skin at bedtime. 16 units on Monday,Wednesday,Friday, 12 units on dialysis days     insulin lispro (HUMALOG) 100 UNIT/ML injection Inject 8-15 Units into the skin 3 (three) times daily  with meals. Per sliding scale     LAMICTAL 200 MG tablet Take 1.5 tablets (300 mg total) by mouth 2 (two) times daily. GLAXO BRAND NAME 270 tablet 1   levETIRAcetam (KEPPRA) 250 MG tablet Take Bid on non -dialysis days, 3 a day on hemodialysis days. 270 tablet 5   levothyroxine (SYNTHROID) 150 MCG tablet Take 150 mcg by mouth daily before breakfast.      lidocaine-prilocaine (EMLA) cream Apply topically as directed.     Methoxy PEG-Epoetin Beta (MIRCERA IJ) Inject as directed. Receives w/ dialysis, unsure how often right now     metoprolol succinate (TOPROL XL) 50 MG 24 hr tablet Take 1 tablet (50 mg total) by mouth 2 (two) times daily. Take with or immediately following a meal. (Patient taking differently: Take 75 mg by mouth 2 (two) times daily.) 60 tablet 11   montelukast (SINGULAIR) 10 MG tablet SMARTSIG:1 Tablet(s) By Mouth Every Evening     nystatin-triamcinolone (MYCOLOG II) cream Apply 1 application topically 2 (two) times daily. (Patient taking differently: Apply 1 application  topically daily as needed (rash).) 30 g 1   pantoprazole  (PROTONIX) 40 MG tablet Take 40 mg by mouth every morning.      Probiotic Product (ALIGN) 4 MG CAPS Take 4 mg by mouth daily.      sevelamer carbonate (RENVELA) 800 MG tablet Take 3,200 mg by mouth daily.     triamcinolone (NASACORT) 55 MCG/ACT AERO nasal inhaler Place 2 sprays into the nose at bedtime.      UNIFINE PENTIPS 32G X 4 MM MISC USE UP TO 5 TIMES DAILY AS DIRECTED  3   valsartan (DIOVAN) 160 MG tablet Take 160 mg by mouth 2 (two) times daily.     No current facility-administered medications on file prior to visit.    Allergies  Allergen Reactions   Cinnamon Other (See Comments)    Raises blood pressure   Procrit [Epoetin Alfa-Epbx] Other (See Comments)    Seizures, grand mal   Ace Inhibitors Cough   Azithromycin Other (See Comments)    seizures   Iron Other (See Comments)    Can not take in IV Form - causes seizures    Neomycin-Bacitracin Zn-Polymyx Other (See Comments)    Unknown   Other    Sulfa Antibiotics Other (See Comments)    Due to risks for seizures.     DIAGNOSTIC DATA (LABS, IMAGING, TESTING) - I reviewed patient records, labs, notes, testing and imaging myself where available.  Lab Results  Component Value Date   WBC 8.6 03/17/2022   HGB 11.0 (L) 03/17/2022   HCT 34.3 (L) 03/17/2022   MCV 107.2 (H) 03/17/2022   PLT 112 (L) 03/17/2022      Component Value Date/Time   NA 139 03/17/2022 1537   NA 142 03/08/2014 1634   NA 141 07/04/2011 0747   K 3.0 (L) 03/17/2022 1537   K 4.6 07/04/2011 0747   CL 99 03/17/2022 1537   CL 109 (H) 07/04/2011 0747   CO2 27 03/17/2022 1537   CO2 20 (L) 07/04/2011 0747   GLUCOSE 337 (H) 03/17/2022 1537   GLUCOSE 198 (H) 07/04/2011 0747   BUN 18 03/17/2022 1537   BUN 82 (HH) 03/08/2014 1634   BUN 65 (H) 07/04/2011 0747   CREATININE 3.68 (H) 03/17/2022 1537   CREATININE 1.95 (H) 07/04/2011 0747   CALCIUM 8.3 (L) 03/17/2022 1537   CALCIUM 8.6 07/04/2011 0747   PROT 6.7 03/17/2022 1537   PROT  6.8 03/08/2014 1634    PROT 7.3 07/04/2011 0747   ALBUMIN 3.8 03/17/2022 1537   ALBUMIN 4.4 03/08/2014 1634   ALBUMIN 3.8 07/04/2011 0747   AST 23 03/17/2022 1537   AST 11 (L) 07/04/2011 0747   ALT 11 03/17/2022 1537   ALT 14 07/04/2011 0747   ALKPHOS 157 (H) 03/17/2022 1537   ALKPHOS 188 (H) 07/04/2011 0747   BILITOT 0.9 03/17/2022 1537   BILITOT 0.2 07/04/2011 0747   GFRNONAA 13 (L) 03/17/2022 1537   GFRNONAA 29 (L) 07/04/2011 0747   GFRAA 15 (L) 01/29/2019 1431   GFRAA 35 (L) 07/04/2011 0747   No results found for: "CHOL", "HDL", "LDLCALC", "LDLDIRECT", "TRIG", "CHOLHDL" No results found for: "HGBA1C" No results found for: "VITAMINB12" Lab Results  Component Value Date   TSH 2.373 04/10/2016    PHYSICAL EXAM:  Today's Vitals   08/12/22 1311  BP: (!) 155/60  Pulse: 67  Weight: 194 lb 9.6 oz (88.3 kg)  Height: 5\' 2"  (1.575 m)   Body mass index is 35.59 kg/m.   Wt Readings from Last 3 Encounters:  08/12/22 194 lb 9.6 oz (88.3 kg)  06/03/22 192 lb (87.1 kg)  02/12/22 192 lb (87.1 kg)     Ht Readings from Last 3 Encounters:  08/12/22 5\' 2"  (1.575 m)  06/03/22 5\' 2"  (1.575 m)  02/12/22 5\' 2"  (1.575 m)    Started HD in 04-2015 / January 2017:     General: The patient is awake, alert and appears not in acute distress. The patient is well groomed. Head: Normocephalic, atraumatic. Neck is supple.  Mallampati 1 - there is no uvula , neck circumference:15.5 inches.    On renal low potassium diet, she lost 45 pounds, BMI 37 kg/m2  . Saturday  was hemodialysis day - 48 hours since last treatment.    Cardiovascular:  Regular rate and rhythm, without  murmurs or carotid bruit, and without distended neck veins. Respiratory: Lungs are clear to auscultation. Skin: ankle  edema, no rash.  Looking bronzed .  Dry skin, Thrill over her AV fistula.  Left arm, non dominant hand.  Trunk: BMI is morbidly obese-   Abdominal hernia.  patient has normal posture.    Neurologic exam : The patient  is awake and alert, oriented to place and time.she is subdued.  Memory subjective described as intact.  There is a normal attention span & concentration ability.  Speech is fluent with hoarseness.    Mood and affect are normal. Cranial nerves: Pupils are equal and slowly  reactive to light.  No disrounded pupils, beginning cloudiness, cataract right over left-  Yellowish sclerea.  Visual fields by finger perimetry are intact.Hearing to tuning fork  rub intact. Facial sensation intact to fine touch.  Facial motor strength is symmetric and tongue moves midline.   Motor exam:  reduced tone and normal muscle bulk, obesity related attenuated DTRs but not absent.  and symmetric normal strength in upper extremities.  Grip strength is weaker over the left - due to fistula.  She has her AV access fistula on the left arm. Thrill is palpable and loudly audible.  Her family stated she is using the left arm less.   Has less grip strength on the left shoulder is droopy on the left- . Now with clumsiness more on the right as well. Tremor.  (She now showers while  seated).    Sensory: Fine touch,and vibration were tested in all extremities and are present in toes and  feet. Proprioception is tested and normal.  Coordination:  Left arm with AV fistula and thrill- Rapid alternating movements in the fingers/hands is tested - . Finger-to-nose maneuver tested l with left side evidence of ataxia, dysmetria and a new non resting, action  tremor.    Gait and station: Patient walks without assistive device and is very slow -  Appears truncally rigid, with no lumbar rotation, turns with 5 steps, and has reduced step width.  . Stance is stable and of wider base. Tandem gait is fragmented. She shuffles.     Deep tendon reflexes: in the upper and lower extremities are symmetric and intact.  Babinski maneuver response is down going on the left and equivocal on the right.    ASSESSMENT AND PLAN 63 y.o. year old  female patient with ESRD on HD, spells that may reflect seizures, hypoglycemia and hypoxia.  here with:    1) HD day with  spells, seizures- marked as diarrhea and stool incontinence. I will change her meds to post dialysis time on hemodialysis , Lamictal , Keppra, Ethosuximide all bid -   2)  try a wedge and keep O2, if you feel worse after 7 days, call us and we will renew CPAP .   3) increased ethosuximide to 7.5 ml. Instead of 5 ml.   4)  patient had difficult Blood glucose control due to being treated with steroids for skin lesions.   5) Bone density testing in a  long term seizure and dialysis patient is recommended.     I plan to follow up either personally or through our NP within 3 months.   I would like to thank  Marguarite Arbour, Md [address] [facility] [place],  [place] [address] for allowing me to meet with and to take care of this pleasant patient.   After spending a total time of 35  minutes face to face and additional time for physical and neurologic examination, review of laboratory studies,  personal review of imaging studies, reports and results of other testing and review of referral information / records as far as provided in visit,   Electronically signed by: Melvyn Novas, MD 08/12/2022 1:17 PM  [facility] Board certified by [employer] of Sleep Medicine and Diplomate of the [employer] of Sleep Medicine. Board certified In Neurology through the ABPN, Fellow of the [employer] of Neu[employer]. Medical Director of [facility].

## 2022-08-12 NOTE — Patient Instructions (Signed)
ASSESSMENT AND PLAN 62 y.o. year old female patient with ESRD on HD, spells that may reflect seizures, hypoglycemia and hypoxia.  here with:    1) HD day with  spells, seizures- marked as diarrhea and stool incontinence. I will change her meds to post dialysis time on hemodialysis , Lamictal , Keppra, Ethosuximide all bid -   2)  OSA resolution: will try a wedge in bed and keep 2 liters of O2, if you feel worse without CPAP after 7 days, call us and we will renew CPAP  supplies.  May repeat HST at home.   3) increased ethosuximide to 7.5 ml. Instead of 5 ml.     I plan to follow up either personally or through our NP within 3 months.

## 2022-08-29 ENCOUNTER — Other Ambulatory Visit: Payer: Self-pay | Admitting: Nurse Practitioner

## 2022-08-29 DIAGNOSIS — R932 Abnormal findings on diagnostic imaging of liver and biliary tract: Secondary | ICD-10-CM

## 2022-09-02 ENCOUNTER — Ambulatory Visit
Admission: RE | Admit: 2022-09-02 | Discharge: 2022-09-02 | Disposition: A | Discharge status: Home / Self Care | Payer: Medicare Other | Source: Ambulatory Visit | Attending: Nurse Practitioner | Admitting: Nurse Practitioner

## 2022-09-02 DIAGNOSIS — R932 Abnormal findings on diagnostic imaging of liver and biliary tract: Secondary | ICD-10-CM | POA: Diagnosis present

## 2022-09-05 ENCOUNTER — Emergency Department: Payer: Medicare Other

## 2022-09-05 ENCOUNTER — Emergency Department
Admission: EM | Admit: 2022-09-05 | Discharge: 2022-09-06 | Disposition: A | Discharge status: Home / Self Care | Payer: Medicare Other | Attending: Emergency Medicine | Admitting: Emergency Medicine

## 2022-09-05 ENCOUNTER — Encounter: Payer: Self-pay | Admitting: Emergency Medicine

## 2022-09-05 DIAGNOSIS — I480 Paroxysmal atrial fibrillation: Secondary | ICD-10-CM | POA: Insufficient documentation

## 2022-09-05 DIAGNOSIS — N186 End stage renal disease: Secondary | ICD-10-CM | POA: Diagnosis not present

## 2022-09-05 DIAGNOSIS — I120 Hypertensive chronic kidney disease with stage 5 chronic kidney disease or end stage renal disease: Secondary | ICD-10-CM | POA: Insufficient documentation

## 2022-09-05 DIAGNOSIS — R002 Palpitations: Secondary | ICD-10-CM | POA: Diagnosis present

## 2022-09-05 DIAGNOSIS — Z992 Dependence on renal dialysis: Secondary | ICD-10-CM | POA: Insufficient documentation

## 2022-09-05 LAB — BASIC METABOLIC PANEL
Anion gap: 10 (ref 5–15)
BUN: 17 mg/dL (ref 8–23)
CO2: 30 mmol/L (ref 22–32)
Calcium: 8.4 mg/dL — ABNORMAL LOW (ref 8.9–10.3)
Chloride: 100 mmol/L (ref 98–111)
Creatinine, Ser: 2.92 mg/dL — ABNORMAL HIGH (ref 0.44–1.00)
GFR, Estimated: 18 mL/min — ABNORMAL LOW (ref >60)
Glucose, Bld: 184 mg/dL — ABNORMAL HIGH (ref 70–99)
Potassium: 3.8 mmol/L (ref 3.5–5.1)
Sodium: 140 mmol/L (ref 135–145)

## 2022-09-05 LAB — TROPONIN I (HIGH SENSITIVITY)
Troponin I (High Sensitivity): 11 ng/L (ref <18)
Troponin I (High Sensitivity): 12 ng/L (ref <18)

## 2022-09-05 LAB — CBC
HCT: 34.4 % — ABNORMAL LOW (ref 36.0–46.0)
Hemoglobin: 11.3 g/dL — ABNORMAL LOW (ref 12.0–15.0)
MCH: 35.6 pg — ABNORMAL HIGH (ref 26.0–34.0)
MCHC: 32.8 g/dL (ref 30.0–36.0)
MCV: 108.5 fL — ABNORMAL HIGH (ref 80.0–100.0)
Platelets: 139 10*3/uL — ABNORMAL LOW (ref 150–400)
RBC: 3.17 MIL/uL — ABNORMAL LOW (ref 3.87–5.11)
RDW: 14 % (ref 11.5–15.5)
WBC: 12.3 10*3/uL — ABNORMAL HIGH (ref 4.0–10.5)
nRBC: 0 % (ref 0.0–0.2)

## 2022-09-05 LAB — BRAIN NATRIURETIC PEPTIDE: B Natriuretic Peptide: 338.6 pg/mL — ABNORMAL HIGH (ref 0.0–100.0)

## 2022-09-05 MED ORDER — DILTIAZEM HCL 25 MG/5ML IV SOLN
10.0000 mg | Freq: Once | INTRAVENOUS | Status: AC
  Administered 2022-09-05: 10 mg via INTRAVENOUS
  Filled 2022-09-05: qty 5

## 2022-09-05 MED ORDER — APIXABAN 5 MG PO TABS
5.0000 mg | ORAL_TABLET | Freq: Two times a day (BID) | ORAL | Status: DC
  Administered 2022-09-06: 5 mg via ORAL
  Filled 2022-09-05: qty 1

## 2022-09-05 MED ORDER — SODIUM CHLORIDE 0.9 % IV BOLUS
500.0000 mL | Freq: Once | INTRAVENOUS | Status: AC
  Administered 2022-09-05: 500 mL via INTRAVENOUS

## 2022-09-05 MED ORDER — DILTIAZEM HCL 25 MG/5ML IV SOLN
5.0000 mg | Freq: Once | INTRAVENOUS | Status: AC
  Administered 2022-09-05: 5 mg via INTRAVENOUS
  Filled 2022-09-05: qty 5

## 2022-09-05 NOTE — ED Provider Notes (Signed)
[facility] Provider Note    Event Date/Time   First MD Initiated Contact with Patient 09/05/22 2114     (approximate)   History   Hypotension   HPI  Nina Johnston is a 62 y.o. female with extensive past medical history including end-stage renal disease, hypertension, bipolar disorder, here with palpitations and flushing feeling.  The patient reportedly sat for a full session of dialysis today.  During dialysis, she had to have the session stop twice due to low blood pressure.  She does not believe her heart rate was high.  When she went home, her father, who is a retired Development worker, community, checked her heart rate and blood pressure and she had a heart rate in the 130s.  Blood pressure was 80 systolic.  Subsequent presents for evaluation.  She feels mildly weak but denies any other complaints.  No chest pain.     Physical Exam   Triage Vital Signs: ED Triage Vitals  Enc Vitals Group     BP 09/05/22 2056 116/68     Pulse Rate 09/05/22 2056 (!) 138     Resp 09/05/22 2056 18     Temp 09/05/22 2056 98.9 F (37.2 C)     Temp Source 09/05/22 2056 Oral     SpO2 09/05/22 2056 100 %     Weight 09/05/22 2055 196 lb (88.9 kg)     Height 09/05/22 2055 5\' 2"  (1.575 m)     Head Circumference --      Peak Flow --      Pain Score 09/05/22 2055 0     Pain Loc --      Pain Edu? --      Excl. in GC? --     Most recent vital signs: Vitals:   09/06/22 0125 09/06/22 0126  BP:  (!) 130/52  Pulse: 72 72  Resp: 18   Temp:    SpO2: 96% 97%     General: Awake, no distress.  CV:  Good peripheral perfusion.  Tachycardic, regular Resp:  Normal work of breathing.  Lungs clear Abd:  No distention.  Other:  No major lower extremity edema.   ED Results / Procedures / Treatments   Labs (all labs ordered are listed, but only abnormal results are displayed) Labs Reviewed  BASIC METABOLIC PANEL - Abnormal; Notable for the following components:      Result Value    Glucose, Bld 184 (*)    Creatinine, Ser 2.92 (*)    Calcium 8.4 (*)    GFR, Estimated 18 (*)    All other components within normal limits  CBC - Abnormal; Notable for the following components:   WBC 12.3 (*)    RBC 3.17 (*)    Hemoglobin 11.3 (*)    HCT 34.4 (*)    MCV 108.5 (*)    MCH 35.6 (*)    Platelets 139 (*)    All other components within normal limits  BRAIN NATRIURETIC PEPTIDE - Abnormal; Notable for the following components:   B Natriuretic Peptide 338.6 (*)    All other components within normal limits  URINALYSIS, ROUTINE W REFLEX MICROSCOPIC  TROPONIN I (HIGH SENSITIVITY)  TROPONIN I (HIGH SENSITIVITY)     EKG Normal sinus rhythm, ventricular at 77.  PR 202, QRS 112, QTc 508.  No acute ST elevations or depressions.  Acute evidence of acute ischemia or infarct   RADIOLOGY Chest x-ray: Stable, enlarged cardiac silhouette   I also independently reviewed  and agree with radiologist interpretations.   PROCEDURES:  Critical Care performed: No   MEDICATIONS ORDERED IN ED: Medications  apixaban (ELIQUIS) tablet 5 mg (5 mg Oral Given 09/06/22 0042)  sodium chloride 0.9 % bolus 500 mL (0 mLs Intravenous Stopped 09/05/22 2318)  diltiazem (CARDIZEM) injection 5 mg (5 mg Intravenous Given 09/05/22 2249)  diltiazem (CARDIZEM) injection 10 mg (10 mg Intravenous Given 09/05/22 2316)     IMPRESSION / MDM / ASSESSMENT AND PLAN / ED COURSE  I reviewed the triage vital signs and the nursing notes.                              Differential diagnosis includes, but is not limited to, A-fib, a flutter, SVT, anemia, occult sepsis, ACS  Patient's presentation is most consistent with acute presentation with potential threat to life or bodily function.  The patient is on the cardiac monitor to evaluate for evidence of arrhythmia and/or significant heart rate changes   62 year old female with extensive past medical history as above including hypertension, end-stage renal disease on  hemodialysis, bipolar disorder, here with palpitations and general fatigue.  Patient arrived in what appeared to be SVT versus a flutter.  She was given 2 doses of diltiazem with eventual return to sinus rhythm with frequent PACs.  Per review of records, she has had several episodes of possible A-fib although this has not been documented.  Otherwise, blood pressure is stable.  Her lab work is very reassuring.  Hemoglobin is at baseline.  Troponin 11.  BMP with a scalpel electrolytes given her end-stage renal status.  She does not appear significantly fluid overloaded.  Chest x-ray is clear.  Will plan to reassess after her last dose of diltiazem.  If she is able to ambulate without difficulty and remains otherwise asymptomatic with stable blood pressures, she can follow-up as an outpatient.  She does have a high CHADS2 Vasc score and will start on Eliquis although she should discuss further continuation of this with her PCP as well as medication adjustments.  I suspect she is going into A-fib during her dialysis sessions and she should notify her nephrologist prior to her next session.   FINAL CLINICAL IMPRESSION(S) / ED DIAGNOSES   Final diagnoses:  Paroxysmal atrial fibrillation (HCC)     Rx / DC Orders   ED Discharge Orders          Ordered    apixaban (ELIQUIS) 5 MG TABS tablet  2 times daily        09/06/22 0009             Note:  This document was prepared using Dragon voice recognition software and may include unintentional dictation errors.   Shaune Pollack, MD 09/06/22 [phone]

## 2022-09-05 NOTE — ED Notes (Signed)
 bolus given instead of bolus per MD.

## 2022-09-05 NOTE — ED Triage Notes (Signed)
Pt presents ambulatory to triage via POV with complaints of hypotension. Pt notes being a dialysis patient (MWF) - completed her session today without complication. Pt states she had some low Bps at home ranging from 80s/60s. A&Ox4 at this time. Denies CP or SOB.

## 2022-09-06 DIAGNOSIS — I480 Paroxysmal atrial fibrillation: Secondary | ICD-10-CM | POA: Diagnosis not present

## 2022-09-06 MED ORDER — APIXABAN 5 MG PO TABS: 5.0000 mg | ORAL_TABLET | Freq: Two times a day (BID) | ORAL | 0 refills | Status: DC

## 2022-09-06 NOTE — Discharge Instructions (Addendum)
For now:  Start the blood thinner Eliquis for stroke prevention  STOP aspirin, Advil, alleve, or any other NSAID medications  For now, I'd recommend holding your Norvasc and Hydralazine until blood pressures are consistently elevated.   Your metoprolol is often used to control heart rate for AFIb. I would favor increasing this as opposed to your other medications to help with both BP and HR, but I'd discuss with your primary doctor and Cardiologist before any additional changes.  I'd contact Dr. Judithann Sheen and your Nephrologist re: your ER visit and AFib in setting of dialysis before your next session.

## 2022-09-06 NOTE — ED Provider Notes (Signed)
She remains stable.  She was able to ambulate without symptoms and heart rate remained in normal sinus rhythm with a normal rate and blood pressures have been stable.  Plan for discharge on Eliquis and PMD follow-up.   Pilar Jarvis, MD 09/06/22 [phone]

## 2022-09-06 NOTE — ED Notes (Signed)
Pt ambulated through hallway with independent gait. Pt had no breathing difficulty while walking and HR after ambulating was 70-80.

## 2022-09-29 NOTE — Progress Notes (Unsigned)
Electrophysiology Office Note:    Date:  10/01/2022   ID:  Nina Johnston, DOB 1960/05/11, MRN [id number]  [facility] Cardiologist:  None  [facility] Electrophysiologist:  Lanier Prude, MD   Referring MD: Marguarite Arbour, MD   Chief Complaint: Supraventricular tachycardia  History of Present Illness:    Nina Johnston is a 63 y.o. female who I am seeing today for an evaluation of supraventricular tachycardia at the request of Dr. Judithann Sheen.  The patient has a history of end-stage renal disease, hypertension, bipolar disorder.  She was recently in the emergency department Sep 05, 2022.  She was at a dialysis session when her heart rates were noted to be elevated into the 130s.  She reported mild fatigue with the increased ventricular rates.  In the emergency department she converted back to sinus rhythm with 2 doses of diltiazem.  She was started on Eliquis at the emergency department in May.  There is been no documented evidence of atrial fibrillation thus far.  Her history of SVT during dialysis goes back years, at least to 2017.  She is with her family today in clinic.  She reports her tachycardia episodes have occurred only during dialysis sessions.  She feels palpitations and lightheadedness during the episodes.  She also reports some low diastolic blood pressures during dialysis sessions.       Their past medical, social and family history was reveiwed.   ROS:   Please see the history of present illness.    All other systems reviewed and are negative.  EKGs/Labs/Other Studies Reviewed:    The following studies were reviewed today:  09/05/2022 ECG in ER    03/17/2022 ECG shows NSR.     Physical Exam:    VS:  BP (!) 136/58   Pulse 67   Ht 5\' 2"  (1.575 m)   Wt 198 lb (89.8 kg)   SpO2 96%   BMI 36.21 kg/m     Wt Readings from Last 3 Encounters:  10/01/22 198 lb (89.8 kg)  09/05/22 196 lb (88.9 kg)  08/12/22 194 lb 9.6 oz (88.3 kg)     GEN:   Well nourished, well developed in no acute distress CARDIAC: RRR, no murmurs, rubs, gallops RESPIRATORY:  Clear to auscultation without rales, wheezing or rhonchi       ASSESSMENT AND PLAN:    1. Supraventricular tachycardia   2. ESRD (end stage renal disease) (HCC)   3. Primary hypertension     #SVT I see no documented evidence of atrial fibrillation.  I suspect she is having recurrent SVT during dialysis sessions.  Based on the appearance of the EKG, I suspect it is atrial tachycardia although I cannot completely exclude other mechanisms of SVT.  We discussed treatment options during clinic today.  Given her complex medical history and medication list, would favor increasing metoprolol for now to see if we can get her symptoms better controlled.  I will increase her metoprolol to 100 mg by mouth twice daily.  We discussed how I have no evidence thus far of atrial fibrillation.  We discussed how this would eliminate the need for Eliquis which will be important for her given she has trouble with bleeding during access at the dialysis center.  I would like to bring her back in the next few weeks for loop recorder implant for atrial fibrillation surveillance and monitoring of her SVT.  This will help guide our treatment strategy.  I discussed the procedure in detail  including the risks and monthly monitoring costs and she wishes to proceed.  When we implant the loop recorder she can stop the Eliquis.  #Hypertension At goal today but she is experiencing relatively low pressures during dialysis.  I am wondering whether or not she is going to eventually need midodrine on dialysis days.  For now, we will decrease her valsartan to 80 mg by mouth twice daily..  Recommend checking blood pressures 1-2 times per week at home and recording the values.  Recommend bringing these recordings to the primary care physician.  #ESRD On thrice weekly dialysis        Signed, Rossie Muskrat. Lalla Brothers, MD, [facility],  [facility] 10/01/2022 1:42 PM    Electrophysiology [facility]

## 2022-10-01 ENCOUNTER — Encounter: Payer: Self-pay | Admitting: Cardiology

## 2022-10-01 ENCOUNTER — Ambulatory Visit: Payer: Medicare Other | Attending: Cardiology | Admitting: Cardiology

## 2022-10-01 ENCOUNTER — Ambulatory Visit: Payer: Medicare Other | Admitting: Cardiology

## 2022-10-01 VITALS — BP 136/58 | HR 67 | Ht 62.0 in | Wt 198.0 lb

## 2022-10-01 DIAGNOSIS — N186 End stage renal disease: Secondary | ICD-10-CM | POA: Diagnosis present

## 2022-10-01 DIAGNOSIS — I10 Essential (primary) hypertension: Secondary | ICD-10-CM | POA: Diagnosis present

## 2022-10-01 DIAGNOSIS — I471 Supraventricular tachycardia, unspecified: Secondary | ICD-10-CM | POA: Diagnosis present

## 2022-10-01 MED ORDER — VALSARTAN 80 MG PO TABS: 80.0000 mg | ORAL_TABLET | Freq: Every day | ORAL | 3 refills | Status: DC

## 2022-10-01 MED ORDER — METOPROLOL SUCCINATE ER 100 MG PO TB24: 100.0000 mg | ORAL_TABLET | Freq: Two times a day (BID) | ORAL | 3 refills | Status: DC

## 2022-10-01 NOTE — Patient Instructions (Addendum)
Medication Instructions:  Your physician has recommended you make the following change in your medication:  1) DECREASE valsartan to 80 mg twice daily  2) INCREASE metoprolol to 100 mg twice daily  *If you need a refill on your cardiac medications before your next appointment, please call your pharmacy*  Testing/Procedures: You will have a loop recorder implanted at your next office visit. There are no restrictions or special instructions prior to this visit.  Follow-Up: At [facility], you and your health needs are our priority.  As part of our continuing mission to provide you with exceptional heart care, we have created designated Provider Care Teams.  These Care Teams include your primary Cardiologist (physician) and Advanced Practice Providers (APPs -  Physician Assistants and Nurse Practitioners) who all work together to provide you with the care you need, when you need it.  Your next appointment:   Loop recorder implant - add on at end of the day  Provider:   Steffanie Dunn, MD

## 2022-10-08 ENCOUNTER — Institutional Professional Consult (permissible substitution): Payer: Medicare Other | Admitting: Cardiology

## 2022-10-15 ENCOUNTER — Ambulatory Visit: Payer: Medicare Other | Admitting: Cardiology

## 2022-10-22 ENCOUNTER — Ambulatory Visit: Payer: Medicare Other | Attending: Cardiology | Admitting: Cardiology

## 2022-10-22 ENCOUNTER — Encounter: Payer: Self-pay | Admitting: Cardiology

## 2022-10-22 VITALS — BP 130/60 | HR 67 | Ht 62.0 in | Wt 198.4 lb

## 2022-10-22 DIAGNOSIS — I10 Essential (primary) hypertension: Secondary | ICD-10-CM

## 2022-10-22 DIAGNOSIS — I471 Supraventricular tachycardia, unspecified: Secondary | ICD-10-CM | POA: Insufficient documentation

## 2022-10-22 NOTE — Progress Notes (Addendum)
Electrophysiology Office Follow up Visit Note:    Date:  10/22/2022   ID:  DENYSHA WESTLAND, DOB February 18, 1961, MRN [id number]  PCP:  Marguarite Arbour, MD  [facility] Cardiologist:  None  [facility] Electrophysiologist:  Lanier Prude, MD    Interval History:    AZELYN WINKER is a 62 y.o. female who presents for a follow up visit.   Last seen Oct 01, 2022.  She has a history of end-stage renal disease, hypertension and bipolar.  She came to me with a diagnosis of atrial fibrillation but I saw no evidence of atrial fibrillation in her records.  We previously discussed using a loop recorder for rhythm surveillance to allow her to stop her Eliquis and avoid bleeding risk.  She presents today to have the loop recorder implanted       Past medical, surgical, social and family history were reviewed.  ROS:   Please see the history of present illness.    All other systems reviewed and are negative.  EKGs/Labs/Other Studies Reviewed:    The following studies were reviewed today:  Today's EKG shows sinus rhythm  Physical Exam:    VS:  BP 130/60 (BP Location: Right Arm, Patient Position: Sitting, Cuff Size: Large)   Pulse 67   Ht 5\' 2"  (1.575 m)   Wt 198 lb 6 oz (90 kg)   SpO2 98%   BMI 36.28 kg/m     Wt Readings from Last 3 Encounters:  10/22/22 198 lb 6 oz (90 kg)  10/01/22 198 lb (89.8 kg)  09/05/22 196 lb (88.9 kg)     GEN:  Well nourished, well developed in no acute distress CARDIAC: RRR, no murmurs, rubs, gallops RESPIRATORY:  Clear to auscultation without rales, wheezing or rhonchi       ASSESSMENT:    No diagnosis found. PLAN:    In order of problems listed above:   #SVT Continue metoprolol 100 mg by mouth twice daily Plan for loop recorder implant as below  #Hypertension At goal today.  Recommend checking blood pressures 1-2 times per week at home and recording the values.  Recommend bringing these recordings to the primary care  physician.  #Reported history of atrial fibrillation No evidence of atrial fibrillation that I can find in the records.  Plan for loop recorder implant today for rhythm surveillance.  She will stop her Eliquis today.   Signed, Steffanie Dunn, MD, [facility], [facility] 10/22/2022 5:00 PM    Electrophysiology [facility]  -------------------------  SURGEON:  Lanier Prude, MD     PREPROCEDURE DIAGNOSIS:  Atrial fibrillation surveillance and SVT    POSTPROCEDURE DIAGNOSIS: Atrial fibrillation surveillance and SVT     PROCEDURES:   1. Implantable loop recorder implantation    INTRODUCTION:  ZMYA LYNG presents with a history of atrial fibrillation and SVT. the costs of loop recorder monitoring have been discussed with the patient.    DESCRIPTION OF PROCEDURE:  Informed written consent was obtained.  A preprocedural timeout was performed with the RN Wenda Low). The patient required no sedation for the procedure today.  Mapping over the patient's chest was performed to identify the area where electrograms were most prominent for ILR recording.  This area was found to be the left parasternal region over the 4th intercostal space. The patients left chest was therefore prepped and draped in the usual sterile fashion. The skin overlying the left parasternal region was infiltrated with lidocaine for local analgesia.  A 0.5-cm incision was made over the left parasternal region over the 3rd intercostal space.  A subcutaneous ILR pocket was fashioned using a combination of sharp and blunt dissection.  A Medtronic Reveal LINQ 2 [id number])implantable loop recorder was then placed into the pocket  R waves were very prominent and measured >0.49mV.  Steri- Strips and a sterile dressing were then applied.  There were no early apparent complications.     CONCLUSIONS:   1. Successful implantation of a implantable loop recorder for Atrial fibrillation surveillance and SVT  2. No early  apparent complications.   Sheria Lang T. Lalla Brothers, MD, [facility], [facility] Cardiac Electrophysiology

## 2022-10-22 NOTE — Patient Instructions (Addendum)
Medication Instructions:  Your physician has recommended you make the following change in your medication:  1) STOP taking Eliquis  Labwork: None ordered.  Testing/Procedures: None ordered.  Follow-Up:  Your physician wants you to follow-up in: 6 months with Nina Don, NP  You will receive a reminder letter in the mail two months in advance. If you Johnston't receive a letter, please call our office to schedule the follow-up appointment.    Implantable Loop Recorder Placement, Care After This sheet gives you information about how to care for yourself after your procedure. Your health care provider may also give you more specific instructions. If you have problems or questions, contact your health care provider. What can I expect after the procedure? After the procedure, it is common to have: Soreness or discomfort near the incision. Some swelling or bruising near the incision.  Follow these instructions at home: Incision care  Monitor your cardiac device site for redness, swelling, and drainage. Call the device clinic at [phone] if you experience these symptoms or fever/chills.  Keep the large square bandage on your site for 24 hours and then you may remove it yourself. Keep the steri-strips underneath in place.   You may shower after 72 hours / 3 days from your procedure with the steri-strips in place. They will usually fall off on their own, or may be removed after 10 days. Pat dry.   Avoid lotions, ointments, or perfumes over your incision until it is well-healed.  Please do not submerge in water until your site is completely healed.   Your device is MRI compatible.   Remote monitoring is used to monitor your cardiac device from home. This monitoring is scheduled every month by our office. It allows [place] to keep an eye on the function of your device to ensure it is working properly.  If your wound site starts to bleed apply pressure.    For help with the monitor please  call Med[employer]onic Monitor Support Specialist directly at [phone].    If you have any questions/concerns please call the device clinic at [phone].  Activity  Return to your normal activities.  General instructions Follow instructions from your health care provider about how to manage your implantable loop recorder and transmit the information. Learn how to activate a recording if this is necessary for your type of device. You may go through a metal detection gate, and you may let someone hold a metal detector over your chest. Show your ID card if needed. Do not have an MRI unless you check with your health care provider first. Take over-the-counter and prescription medicines only as told by your health care provider. Keep all follow-up visits as told by your health care provider. This is important. Contact a health care provider if: You have redness, swelling, or pain around your incision. You have a fever. You have pain that is not relieved by your pain medicine. You have triggered your device because of fainting (syncope) or because of a heartbeat that feels like it is racing, slow, fluttering, or skipping (palpitations). Get help right away if you have: Chest pain. Difficulty breathing. Summary After the procedure, it is common to have soreness or discomfort near the incision. Change your dressing as told by your health care provider. Follow instructions from your health care provider about how to manage your implantable loop recorder and transmit the information. Keep all follow-up visits as told by your health care provider. This is important. This information is not intended to replace  advice given to you by your health care provider. Make sure you discuss any questions you have with your health care provider. Document Released: 04/02/2015 Document Revised: 06/06/2017 Document Reviewed: 06/06/2017 Elsevier Patient Education  2020 [employer].

## 2022-11-04 ENCOUNTER — Other Ambulatory Visit: Payer: Self-pay | Admitting: Neurology

## 2022-11-04 DIAGNOSIS — R270 Ataxia, unspecified: Secondary | ICD-10-CM

## 2022-11-04 DIAGNOSIS — R4789 Other speech disturbances: Secondary | ICD-10-CM

## 2022-11-08 ENCOUNTER — Other Ambulatory Visit: Payer: Self-pay | Admitting: Neurology

## 2022-11-08 DIAGNOSIS — R404 Transient alteration of awareness: Secondary | ICD-10-CM

## 2022-11-08 DIAGNOSIS — R270 Ataxia, unspecified: Secondary | ICD-10-CM

## 2022-11-08 DIAGNOSIS — R4789 Other speech disturbances: Secondary | ICD-10-CM

## 2022-11-18 ENCOUNTER — Ambulatory Visit
Admission: RE | Admit: 2022-11-18 | Discharge: 2022-11-18 | Disposition: A | Discharge status: Home / Self Care | Payer: Medicare Other | Source: Ambulatory Visit | Attending: Neurology | Admitting: Neurology

## 2022-11-18 DIAGNOSIS — R4789 Other speech disturbances: Secondary | ICD-10-CM | POA: Diagnosis present

## 2022-11-18 DIAGNOSIS — R404 Transient alteration of awareness: Secondary | ICD-10-CM | POA: Diagnosis present

## 2022-11-18 DIAGNOSIS — N186 End stage renal disease: Secondary | ICD-10-CM | POA: Diagnosis present

## 2022-11-18 DIAGNOSIS — M85832 Other specified disorders of bone density and structure, left forearm: Secondary | ICD-10-CM | POA: Insufficient documentation

## 2022-11-18 DIAGNOSIS — Z992 Dependence on renal dialysis: Secondary | ICD-10-CM | POA: Diagnosis present

## 2022-11-18 NOTE — Progress Notes (Signed)
OSTEOPENIA was confirmed, not unusual in a patient on long term anti-seizure medications and on hemodialysis.  All patients should optimize calcium and vitamin D intake.  2. Consider FDA-approved medical therapies in postmenopausal women and men aged 63 years and older, : Please follow with PCP  for Vit D 2000 units a day po and calcium supplement if no history of kidney stones is present

## 2022-11-19 ENCOUNTER — Telehealth: Payer: Self-pay | Admitting: Anesthesiology

## 2022-11-19 ENCOUNTER — Telehealth: Payer: Self-pay

## 2022-11-19 ENCOUNTER — Telehealth: Payer: Self-pay | Admitting: Cardiology

## 2022-11-19 NOTE — Telephone Encounter (Signed)
   Name: Nina Johnston  DOB: [birth year]  MRN: [id number]  Primary Cardiologist: Dr. Lalla Brothers   Preoperative team, please contact this patient and set up a phone call appointment for further preoperative risk assessment. Please obtain consent and complete medication review. Thank you for your help.   I confirm that guidance regarding antiplatelet and oral anticoagulation therapy has been completed and, if necessary, noted below.  Pharmacy clearance requested for holding Eliquis, on chart review Eliquis was stopped on 10/22/22.   [name], NP 11/19/2022, 1:09 PM [facility]

## 2022-11-19 NOTE — Telephone Encounter (Signed)
-----   Message from Comstock Dohmeier sent at 11/18/2022  5:51 PM EDT ----- OSTEOPENIA was confirmed, not unusual in a patient on long term anti-seizure medications and on hemodialysis.  All patients should optimize calcium and vitamin D intake.  2. Consider FDA-approved medical therapies in postmenopausal women and men aged 63 years and older, : Please follow with PCP  for Vit D 2000 units a day po and calcium supplement if no history of kidney stones is present

## 2022-11-19 NOTE — Telephone Encounter (Signed)
Pt tele scheduled 7/18 at 3 pm. Med rec and consent done

## 2022-11-19 NOTE — Telephone Encounter (Signed)
   Pre-operative Risk Assessment    Patient Name: Nina Johnston  DOB: 07-28-1960 MRN: [id number]       Request for Surgical Clearance    Procedure:   COLONOSCOPY/EGD gery:  Clearance 11/25/22                               Surgeon:  DR Mia Creek Surgeon's Group or Practice Name:  [facility] Phone number:  [phone] Belva Bertin, CMA Fax number:  [phone]  Type of Clearance Requested:   - Pharmacy:  Hold Apixaban (Eliquis)    Type of Anesthesia:  Not Indicated   Additional requests/questions:    Signed, Dalia Heading   11/19/2022, 12:08 PM

## 2022-11-19 NOTE — Telephone Encounter (Signed)
Called pt and spoke with pt's mother Nina Johnston ok to do per DPR and pt. Informed of bone density results.  Per Dr Valetta Mole osteopenia  was confirmed, this is not unusual in a patient on long term anti-seizure medications and on hemodialysis. All patients should optimize Consider FDA-approved medical therapies in postmenopausal women and men aged 63 years and older, : Please follow with PCP  for Vit D 2000 units a day po and calcium supplement if no history of kidney stones is present calcium and vitamin D intake.  Pt's mother verbalized understanding. Pt's mother had no additional questions at this time but was encouraged to call back if questions arise.

## 2022-11-19 NOTE — Telephone Encounter (Signed)
  Patient Consent for Virtual Visit        Nina Johnston has provided verbal consent on 11/19/2022 for a virtual visit (video or telephone).   CONSENT FOR VIRTUAL VISIT FOR:  Nina Johnston  By participating in this virtual visit I agree to the following:  I hereby voluntarily request, consent and authorize [facility] and its employed or contracted physicians, physician assistants, nurse practitioners or other licensed health care professionals (the Practitioner), to provide me with telemedicine health care services (the "Services") as deemed necessary by the treating Practitioner. I acknowledge and consent to receive the Services by the Practitioner via telemedicine. I understand that the telemedicine visit will involve communicating with the Practitioner through live audiovisual communication technology and the disclosure of certain medical information by electronic transmission. I acknowledge that I have been given the opportunity to request an in-person assessment or other available alternative prior to the telemedicine visit and am voluntarily participating in the telemedicine visit.  I understand that I have the right to withhold or withdraw my consent to the use of telemedicine in the course of my care at any time, without affecting my right to future care or treatment, and that the Practitioner or I may terminate the telemedicine visit at any time. I understand that I have the right to inspect all information obtained and/or recorded in the course of the telemedicine visit and may receive copies of available information for a reasonable fee.  I understand that some of the potential risks of receiving the Services via telemedicine include:  Delay or interruption in medical evaluation due to technological equipment failure or disruption; Information transmitted may not be sufficient (e.g. poor resolution of images) to allow for appropriate medical decision making by the  Practitioner; and/or  In rare instances, security protocols could fail, causing a breach of personal health information.  Furthermore, I acknowledge that it is my responsibility to provide information about my medical history, conditions and care that is complete and accurate to the best of my ability. I acknowledge that Practitioner's advice, recommendations, and/or decision may be based on factors not within their control, such as incomplete or inaccurate data provided by me or distortions of diagnostic images or specimens that may result from electronic transmissions. I understand that the practice of medicine is not an exact science and that Practitioner makes no warranties or guarantees regarding treatment outcomes. I acknowledge that a copy of this consent can be made available to me via my patient portal [facility] MyChart), or I can request a printed copy by calling the office of [facility].    I understand that my insurance will be billed for this visit.   I have read or had this consent read to me. I understand the contents of this consent, which adequately explains the benefits and risks of the Services being provided via telemedicine.  I have been provided ample opportunity to ask questions regarding this consent and the Services and have had my questions answered to my satisfaction. I give my informed consent for the services to be provided through the use of telemedicine in my medical care

## 2022-11-20 ENCOUNTER — Ambulatory Visit: Payer: Medicare Other

## 2022-11-20 ENCOUNTER — Other Ambulatory Visit: Payer: Self-pay | Admitting: Neurology

## 2022-11-20 NOTE — Progress Notes (Deleted)
Virtual Visit via Telephone Note   Because of Nina Johnston's co-morbid illnesses, she is at least at moderate risk for complications without adequate follow up.  This format is felt to be most appropriate for this patient at this time.  The patient did not have access to video technology/had technical difficulties with video requiring transitioning to audio format only (telephone).  All issues noted in this document were discussed and addressed.  No physical exam could be performed with this format.  Please refer to the patient's chart for her consent to telehealth for [facility].  Evaluation Performed:  Preoperative cardiovascular risk assessment _____________   Date:  11/20/2022   Patient ID:  Nina Johnston, DOB 1961/04/16, MRN [id number] Patient Location:  Home Provider location:   Office  Primary Care Provider:  Marguarite Arbour, MD Primary Cardiologist:  None  Chief Complaint / Patient Profile   62 y.o. y/o female with a h/o *** who is pending colonoscopy/EGD with Dr. Mia Creek of [facility] GI and presents today for telephonic preoperative cardiovascular risk assessment.  History of Present Illness    Nina Johnston is a 62 y.o. female who presents via audio/video conferencing for a telehealth visit today.  Pt was last seen in cardiology clinic on *** by ***.  At that time Nina Johnston was doing well ***.  The patient is now pending procedure as outlined above. 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  On multiple psychotropic medications, and with DM>    Benign essential HTN 12/26/2014   Bipolar disorder (HCC)    Bronchitis    Carotid artery narrowing 12/22/2014    Cervical prolapse    CHF (congestive heart failure) (HCC)    CKD (chronic kidney disease)    Concussion 01-14-14   Fall    COPD (chronic obstructive pulmonary disease) (HCC)    Coronary artery disease    Depression    Diabetes mellitus without complication (HCC)    Dialysis patient (HCC)    Diplopia    Dupuytren's contracture of foot    Essential (primary) hypertension 02/12/2015   Family history of thyroid problem    GERD (gastroesophageal reflux disease)    Gout    Heart disease    enlarged because of COPD   Heart murmur    Hepatomegaly    HLD (hyperlipidemia)    Hyperactive airway disease    Hyperkalemia    Hypertension    Hyperthyroidism    Irritable colon    Morbid obesity (HCC)    Multiple thyroid nodules    OAB (overactive bladder)    Pernicious anemia    Post menopausal syndrome    Post-concussion syndrome 03/08/2014   Post-traumatic brain syndrome    Prolapsed uterus    PSVT (paroxysmal supraventricular tachycardia)    Renal calculus    Renal disease    w/ GFR 29-may be due to diabetes   Seizures (HCC)    last seizure 2012. has had spells since with no knowledge. 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    Transient alteration of awareness 11/10/2012   Urinary incontinence with continuous leakage 03/08/2014   Past Surgical History:  Procedure Laterality Date   A/V FISTULAGRAM Left 07/21/2016   Procedure: A/V Fistulagram;  Surgeon: Annice Needy, MD;  Location: [facility] INVASIVE CV LAB;  Service: Cardiovascular;  Laterality: Left;   A/V FISTULAGRAM Left 12/24/2016   Procedure: A/V Fistulagram;  Surgeon: Annice Needy, MD;  Location: [facility];  Service: Cardiovascular;  Laterality: Left;   A/V FISTULAGRAM Left 06/29/2017   Procedure: A/V FISTULAGRAM;  Surgeon: Annice Needy, MD;  Location: [facility] INVAS[facility] CV [facility];  Service: Cardiovascular;  Laterality: Left;   A/V FISTULAGRAM Left  03/08/2018   Procedure: A/V FISTULAGRAM;  Surgeon: Annice Needy, MD;  Location: [facility];  Service: Cardiovascular;  Laterality: Left;   A/V FISTULAGRAM Left 04/11/2019   Procedure: A/V FISTULAGRAM;  Surgeon: Annice Needy, MD;  Location: [facility];  Service: Cardiovascular;  Laterality: Left;   A/V SHUNT INTERVENTION N/A 07/21/2016   Procedure: A/V Shunt Intervention;  Surgeon: Annice Needy, MD;  Location: [facility];  Service: Cardiovascular;  Laterality: N/A;   A/V SHUNT INTERVENTION N/A 12/24/2016   Procedure: A/V SHUNT INTERVENTION;  Surgeon: Annice Needy, MD;  Location: [facility];  Service: Cardiovascular;  Laterality: N/A;   AVF     BREAST BIOPSY Left 06/12/2022   LEFT stereo bx, calcs, "X" clip-path pending   BREAST BIOPSY Left 06/12/2022   MM LT BREAST BX W LOC DEV 1ST LESION IMAGE BX SPEC STEREO GUIDE 06/12/2022 [facility]   CHOLECYSTECTOMY     COLONOSCOPY     COLONOSCOPY WITH PROPOFOL N/A 03/18/2017   Procedure: COLONOSCOPY WITH PROPOFOL;  Surgeon: Scot Jun, MD;  Location[facility];  Service: Endoscopy;  Laterality: N/A;   ENDOMETRIAL BIOPSY     ablation, uterine   ESOPHAGOGASTRODUODENOSCOPY (EGD) WITH PROPOFOL N/A 03/18/2017   Procedure: ESOPHAGOGASTRODUODENOSCOPY (EGD) WITH PROPOFOL;  Surgeon: Scot Jun, MD;  Location: [facility] ENDOSCOPY;  Service: Endoscopy;  Laterality: N/A;   HERNIA REPAIR     HERNIA REPAIR     PERIPHERAL VASCULAR CATHETERIZATION N/A 08/23/2015   Procedure: Dialysis/Perma Catheter Insertion;  Surgeon: Annice Needy, MD;  Location: [facility]VASIVE CV [facility];  Service: Cardiovascular;  Laterality: N/A;   PERIPHERAL VASCULAR CATHETERIZATION N/A 11/09/2015   Procedure: Dialysis/Perma Catheter Removal;  Surgeon: Renford Dills, MD;  Location: [facility] CV [facility];  Service: Cardiovascular;  Laterality: N/A;   PERIPHERAL VASCULAR CATHETERIZATION Left 04/23/2016   Procedure: A/V Fistulagram;  Surgeon:  Annice Needy, MD;  Location: [facility] INVASIVE CV LAB;  Service: Cardiovascular;  Laterality: Left;   PORT A CATH REVISION      Allergies  Allergies  Allergen Reactions   Cinnamon Other (See Comments)    Raises blood pressure   Procrit [Epoetin Alfa-Epbx] Other (See Comments)    Seizures, grand mal   Ace Inhibitors Cough   Azithromycin Other (See Comments)    seizures   Iron Other (See Comments)    Can not take in IV Form - causes seizures    Neomycin-Bacitracin Zn-Polymyx Other (See Comments)    Unknown   Other    Sulfa Antibiotics Other (See Comments)    Due to risks for seizures.    Home Medications    Prior to Admission medications   Medication Sig Start Date End Date Taking? Authorizing Provider  Gean Quint  1 GM 210 MG(Fe) tablet Take 630 mg by mouth 3 (three) times daily. 07/23/22   [provider]  b complex vitamins tablet Take 1 tablet by mouth at bedtime.     [provider]  B Complex-C-Folic Acid (DIALYVITE TABLET) TABS Take 1 tablet by mouth daily. 11/14/17   [provider]  clonazePAM (KLONOPIN) 1 MG tablet Take 1-2 tablets (1-2 mg total) by mouth daily. 02/12/22   Dohmeier, Porfirio Mylar, MD  Continuous Blood Gluc Sensor (DEXCOM G6 SENSOR) MISC Use 1 every 10 days 07/31/20   [provider]  Continuous Blood Gluc Transmit (DEXCOM G6 TRANSMITTER) MISC USE EVERY 3 MONTHS 11/06/20   [provider]  ethosuximide (ZARONTIN) 250 MG/5ML solution TAKE 7. 5 ML BY MOUTH TWICE A DAY TAKE AFTER HD TREATMENT ON DIALYSIS DAY. 11/05/22   Dohmeier, Porfirio Mylar, MD  famotidine (PEPCID) 40 MG tablet SMARTSIG:1 Tablet(s) By Mouth Every Evening 10/04/20   [provider]  Febuxostat 80 MG TABS Take 80 mg by mouth daily.    [provider]  fexofenadine (ALLEGRA) 180 MG tablet Take 180 mg by mouth daily.    [provider]  Fluticasone-Salmeterol (ADVAIR) 250-50 MCG/DOSE AEPB Inhale 1 puff into the lungs 2 (two) times daily.    [provider]  furosemide (LASIX) 20 MG tablet Take 30 mg by mouth 2 (two) times daily.     [provider]  HYDROcodone-acetaminophen (NORCO/VICODIN) 5-325 MG tablet Take 1 tablet by mouth every 6 (six) hours as needed. 03/17/19   [provider]  insulin glargine (LANTUS) 100 UNIT/ML injection Inject 22 Units into the skin at bedtime. 16 units on Monday,Wednesday,Friday, 12 units on dialysis days    [provider]  insulin lispro (HUMALOG) 100 UNIT/ML injection Inject 8-15 Units into the skin 3 (three) times daily with meals. Per sliding scale    [provider]  LAMICTAL 200 MG tablet Take 1.5 tablets (300 mg total) by mouth 2 (two) times daily. Heide Guile NAME 08/12/22   Dohmeier, Porfirio Mylar, MD  levETIRAcetam (KEPPRA) 250 MG tablet Take Bid on non -dialysis days, 3 a day on hemodialysis days. 08/12/22   Dohmeier, Porfirio Mylar, MD  levothyroxine (SYNTHROID) 150 MCG tablet Take 150 mcg by mouth daily before breakfast.     [provider]  lidocaine-prilocaine (EMLA) cream Apply topically as directed. 01/15/20   [provider]  Methoxy PEG-Epoetin Beta (MIRCERA IJ) Inject as directed. Receives w/ dialysis, unsure how often right now    [provider]  metoprolol succinate (TOPROL XL) 100 MG 24 hr tablet Take 1 tablet (100 mg total) by mouth in the morning and at bedtime. Take with or immediately following a meal. 10/01/22   Lanier Prude, MD  montelukast (SINGULAIR) 10 MG tablet SMARTSIG:1 Tablet(s) By Mouth Every Evening 12/05/20   [provider]  nystatin-triamcinolone (MYCOLOG II) cream Apply 1 application topically 2 (two) times daily. 10/22/16   Defrancesco, Prentice Docker, MD  pantoprazole (PROTONIX) 40 MG tablet Take 40 mg by mouth every morning.     [provider]  Probiotic Product (ALIGN) 4 MG CAPS Take 4 mg by mouth daily.     [provider]  sevelamer carbonate (RENVELA) 800 MG tablet Take 3,200 mg by mouth  daily. 11/07/20   [provider]  triamcinolone (NASACORT) 55 MCG/ACT AERO nasal inhaler Place 2 sprays into the nose at bedtime.     [provider]  UNIFINE PENTIPS 32G X 4 MM MISC  USE UP TO 5 TIMES DAILY AS DIRECTED 11/22/17   [provider]  valsartan (DIOVAN) 80 MG tablet Take 1 tablet (80 mg total) by mouth daily. 10/01/22   Lanier Prude, MD    Physical Exam    Vital Signs:  MEDEA DEINES does not have vital signs available for review today.***  Given telephonic nature of communication, physical exam is limited. AAOx3. NAD. Normal affect.  Speech and respirations are unlabored.  Accessory Clinical Findings    None  Assessment & Plan    1.  Preoperative Cardiovascular Risk Assessment:  The patient was advised that if she develops new symptoms prior to surgery to contact our office to arrange for a follow-up visit, and she verbalized understanding.  (Reminder: Include SBE prophylaxis/Antiplatelet/Anticoag Instructions***)  A copy of this note will be routed to requesting surgeon.  Time:   Today, I have spent *** minutes with the patient with telehealth technology discussing medical history, symptoms, and management plan.     Joylene Grapes, NP  11/20/2022, 1:37 PM

## 2022-11-20 NOTE — Telephone Encounter (Signed)
   Patient Name: Nina Johnston  DOB: June 25, 1960 MRN: [id number]  Primary Cardiologist: None  Chart reviewed as part of pre-operative protocol coverage.  Clearance request received for pharmacy clearance for upcoming EGD/colonoscopy.  Eliquis was discontinued per Dr. Lalla Brothers on 10/22/2022.  I will route this recommendation to the requesting party via Epic fax function and remove from pre-op pool.  Please call with questions.  Joylene Grapes, NP 11/20/2022, 1:41 PM

## 2022-11-24 ENCOUNTER — Telehealth: Payer: Self-pay | Admitting: *Deleted

## 2022-11-24 NOTE — Telephone Encounter (Signed)
S/w the pt's mother who has made tele pre op appt for the pt 01/13/23 @ 2 pm. Med rec and consent are done. Pt has legal guardian.     Patient Consent for Virtual Visit        Nina Johnston has provided verbal consent on 11/24/2022 for a virtual visit (video or telephone).   CONSENT FOR VIRTUAL VISIT FOR:  Nina Johnston  By participating in this virtual visit I agree to the following:  I hereby voluntarily request, consent and authorize [facility] and its employed or contracted physicians, physician assistants, nurse practitioners or other licensed health care professionals (the Practitioner), to provide me with telemedicine health care services (the "Services") as deemed necessary by the treating Practitioner. I acknowledge and consent to receive the Services by the Practitioner via telemedicine. I understand that the telemedicine visit will involve communicating with the Practitioner through live audiovisual communication technology and the disclosure of certain medical information by electronic transmission. I acknowledge that I have been given the opportunity to request an in-person assessment or other available alternative prior to the telemedicine visit and am voluntarily participating in the telemedicine visit.  I understand that I have the right to withhold or withdraw my consent to the use of telemedicine in the course of my care at any time, without affecting my right to future care or treatment, and that the Practitioner or I may terminate the telemedicine visit at any time. I understand that I have the right to inspect all information obtained and/or recorded in the course of the telemedicine visit and may receive copies of available information for a reasonable fee.  I understand that some of the potential risks of receiving the Services via telemedicine include:  Delay or interruption in medical evaluation due to technological equipment failure or  disruption; Information transmitted may not be sufficient (e.g. poor resolution of images) to allow for appropriate medical decision making by the Practitioner; and/or  In rare instances, security protocols could fail, causing a breach of personal health information.  Furthermore, I acknowledge that it is my responsibility to provide information about my medical history, conditions and care that is complete and accurate to the best of my ability. I acknowledge that Practitioner's advice, recommendations, and/or decision may be based on factors not within their control, such as incomplete or inaccurate data provided by me or distortions of diagnostic images or specimens that may result from electronic transmissions. I understand that the practice of medicine is not an exact science and that Practitioner makes no warranties or guarantees regarding treatment outcomes. I acknowledge that a copy of this consent can be made available to me via my patient portal [facility] MyChart), or I can request a printed copy by calling the office of [facility].    I understand that my insurance will be billed for this visit.   I have read or had this consent read to me. I understand the contents of this consent, which adequately explains the benefits and risks of the Services being provided via telemedicine.  I have been provided ample opportunity to ask questions regarding this consent and the Services and have had my questions answered to my satisfaction. I give my informed consent for the services to be provided through the use of telemedicine in my medical care

## 2022-11-24 NOTE — Telephone Encounter (Signed)
   Name: Nina Johnston  DOB: [birth year]  MRN: [id number]  Primary Cardiologist: None   Preoperative team, please contact this patient and set up a phone call appointment on or after 12/04/2022 for further preoperative risk assessment. Please obtain consent and complete medication review. Thank you for your help.  I confirm that guidance regarding antiplatelet and oral anticoagulation therapy has been completed and, if necessary, noted below (N/A).    Joylene Grapes, NP 11/24/2022, 4:44 PM [facility]

## 2022-11-24 NOTE — Telephone Encounter (Signed)
   Pre-operative Risk Assessment    Patient Name: Nina Johnston  DOB: April 24, 1961 MRN: [id number]      Request for Surgical Clearance    Procedure:   COLONOSCOPY  /EGD  Date of Surgery:  Clearance 02/03/23                                  Surgeon's Group or Practice Name:  [facility]/ GASTROENTEROLOGY  Phone number:  [phone] Fax number:  [phone] ATTN Dahlia Client HOLT CMA    Type of Clearance Requested:      Type of Anesthesia:  General    Additional requests/questions:   N/A  Freddy Jaksch   11/24/2022, 4:21 PM

## 2022-11-24 NOTE — Telephone Encounter (Signed)
S/w the pt's mother who has made tele pre op appt for the pt 01/13/23 @ 2 pm. Med rec and consent are done. Pt has legal guardian.

## 2022-11-26 ENCOUNTER — Ambulatory Visit (INDEPENDENT_AMBULATORY_CARE_PROVIDER_SITE_OTHER): Payer: Medicare Other

## 2022-11-26 DIAGNOSIS — I119 Hypertensive heart disease without heart failure: Secondary | ICD-10-CM

## 2022-11-27 LAB — CUP PACEART REMOTE DEVICE CHECK
Date Time Interrogation Session: 20240724215415
Implantable Pulse Generator Implant Date: 20240619

## 2022-12-02 ENCOUNTER — Ambulatory Visit: Payer: Medicare Other | Admitting: Neurology

## 2022-12-02 ENCOUNTER — Encounter: Payer: Self-pay | Admitting: Neurology

## 2022-12-02 VITALS — BP 159/65 | HR 66 | Ht 62.0 in | Wt 199.0 lb

## 2022-12-02 DIAGNOSIS — R4789 Other speech disturbances: Secondary | ICD-10-CM | POA: Diagnosis not present

## 2022-12-02 DIAGNOSIS — R404 Transient alteration of awareness: Secondary | ICD-10-CM | POA: Diagnosis not present

## 2022-12-02 NOTE — Progress Notes (Signed)
Provider:  Melvyn Novas, MD  Primary Care Physician:  Marguarite Arbour, MD [address] [facility] [place] [address]     Referring Provider: Marguarite Arbour, Md [address] [facility] [place],  [place] [address]          Chief Complaint according to patient   Patient presents with:     Established Patient (Initial Visit)           HISTORY OF PRESENT ILLNESS:  Nina Johnston is a 62 y.o. female patient who is here for revisit 12/02/2022 for  Transient LOC, alteration of awareness, and she is on hemodialysis, had a recent bone density scan, plans for colonoscopy.   Chief concern according to patient : Since we last met she has had no more spells ! The change in medication intake time seems to have worked, very happy !!    Has normal H and H currently. Is afraid of colonoscopy  due to fluid load and cleansing,  as she lives with fluid restriction-  wondered if she can have Linzess as a laxative ?     Nina Johnston is a 62 y.o. female patient who was last here  here for revisit 08/12/2022 for  follow up on HST -    Here with her mother and father, her brother would be on the phone. Patient states she would like to know what happen after her sleep study results.   This was last visits Plan :  62 y.o. year old female patient with ESRD on HD, spells that may reflect seizures, hypoglycemia and hypoxia.  here with:     1) HD day with  spells, seizures- marked as diarrhea and stool incontinence. I will change her meds to post dialysis time on hemodialysis , Lamictal , Keppra, Ethosuximide all bid -    2)  try a wedge and keep O2, if you feel worse after 7 days, call us and we will renew CPAP .    3) increased ethosuximide to 7.5 ml. Instead of 5 ml.    4)  patient had difficult Blood glucose control due to being treated with steroids for skin lesions.    5) Bone density testing in a  long term seizure and dialysis patient  is recommended.             08-12-2022:  Do I stop CPAP ? Also, spells  that may reflect seizures. Its always on dialysis days, she has incontinence with stool and not urine- she has oliguria. These spells are happening from 4-6 Pm and following dialysis treatment.  They started when her HD time was reduced from 4 hours to 3.45 but changing the time back has not solved the problems. She is on erythropoetin again, which has been associated with seizures.     I quoted the results of her in-lab sleep study. Confirmed with patient and her family  that the study  took place on a non dialysis day, in an adjusted bed, 12-15 degrees and she was placed on 2 liters of 02.    She is free to continue CPAP if she likes, she needs to have the oxygen in any case.      Based on CMS criteria (using a 4% oxygen desaturation rule for scoring hypopneas), there were 0 apneas (0 obstructive; 0 central; 0 mixed), and 2 hypopneas.   Apnea index was 0.0/h. Hypopnea index was  0.3/h. The AHI/ apnea-hypopnea index was 0.3/h overall (0.7 supine, 0 non-supine; 0.0 REM, 0.0 supine REM). OXIMETRY: Oxyhemoglobin Saturation Nadir during sleep was at  85%) from a mean of 96%.  Of the Total sleep time (TST) there were 20.8 minutes in  hypoxemia (<89%), or 5.9% of total sleep time. The time in hypoxemia was evident in the early hours of the sleep study and oxygen had to be supplemented at 2 liters/min. see graphic display  LIMB MOVEMENTS: There were 0 periodic limb movements of sleep. AROUSAL: There were 33 arousals in total, for an arousal index of 6 arousals/hour.  Of these, 1 were identified as respiratory-related arousals (0 /h), 0 were PLM-related arousals (0 /h), and 38 were non-specific arousals (6 /h).There were 0 occurrences of Cheyne Stokes breathing.   EEG:  PSG EEG was of reduced amplitude and reduced background frequency. EKG: The electrocardiogram documented a regular rhythm.  The heart rate during sleep varied  between a minimum of 55 and  a maximum of  99 bpm. Mean HR was 64 bpm.  AUDIO and VIDEO: no complex movements, vocalizations or enactment was captured.   IMPRESSION: 1) NO APNEA was found- This is surprising in a patient who has been often fluid overloaded in the past.  This study was to take place on a non-dialysis day to capture the respiratory burden of fluid load but Sleep disordered breathing was not present but oxygen desaturation was severe enough ( more than 5 minutes consecutively in desaturation below 88% 02)  to supplement oxygen as the patient uses at home.               Review of Systems: Out of a complete 14 system review, the patient complains of only the following symptoms, and all other reviewed systems are negative.:  Fatigue, sleepiness ,   How likely are you to doze in the following situations: 0 = not likely, 1 = slight chance, 2 = moderate chance, 3 = high chance   Sitting and Reading? Watching Television? Sitting inactive in a public place (theater or meeting)? As a passenger in a car for an hour without a break? Lying down in the afternoon when circumstances permit? Sitting and talking to someone? Sitting quietly after lunch without alcohol? In a car, while stopped for a few minutes in traffic?   Total = 15/ 24 points   FSS endorsed at 21/ 63 points.     Social History   Socioeconomic History   Marital status: Single    Spouse name: Not on file   Number of children: 0   Years of education:  college   Highest education level: Not on file  Occupational History   Occupation: not employed  Tobacco Use   Smoking status: Never   Smokeless tobacco: Never  Vaping Use   Vaping status: Never Used  Substance and Sexual Activity   Alcohol use: No   Drug use: No   Sexual activity: Not Currently  Other Topics Concern   Not on file  Social History Narrative   Patient is single and lives with her parents.   Patient is disabled.   Patient has a college  education.   Patient is right- handed.   Patient drinks tea occasionally. 2 glasses of tea when they eat out.   Social Determinants of Health   Financial Resource Strain: Low Risk  (03/08/2018)   Overall Financial Resource Strain (CARDIA)    Difficulty of Paying Living Expenses: Not very hard  Food  Insecurity: Food Insecurity Present (03/08/2018)   Hunger Vital Sign    Worried About Running Out of Food in the Last Year: Sometimes true    Ran Out of Food in the Last Year: Sometimes true  Transportation Needs: No Transportation Needs (03/08/2018)   PRAPARE - Administrator, Civil Service (Medical): No    Lack of Transportation (Non-Medical): No  Physical Activity: Unknown (03/08/2018)   Exercise Vital Sign    Days of Exercise per Week: 0 days    Minutes of Exercise per Session: Not on file  Stress: No Stress Concern Present (03/08/2018)   [employer] of Occupational Health - Occupational Stress Questionnaire    Feeling of Stress : Not at all  Social Connections: Moderately Integrated (03/08/2018)   Social Connection and Isolation Panel [NHANES]    Frequency of Communication with Friends and Family: More than three times a week    Frequency of Social Gatherings with Friends and Family: More than three times a week    Attends Religious Services: More than 4 times per year    Active Member of [employer] or Organizations: Yes    Attends Banker Meetings: 1 to 4 times per year    Marital Status: Divorced    Family History  Problem Relation Age of Onset   Diabetes Mother    Lumbar disc disease Mother    Migraines Mother    Hypertension Mother    Cancer - Prostate Father        mild   Diabetes Father    Hypertension Father    Breast cancer Neg Hx    Ovarian cancer Neg Hx    Colon cancer Neg Hx    Heart disease Neg Hx    Kidney cancer Neg Hx    Bladder Cancer Neg Hx     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 fistula (HCC)    Acute bacterial  sinusitis 04/19/2014   Adaptive colitis    Anginal pain (HCC)    Anxiety    Anxiety and depression    Arthritis    Asthma    Ataxia 11/10/2012    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  On multiple psychotropic medications, and with DM>    Benign essential HTN 12/26/2014   Bipolar disorder (HCC)    Bronchitis    Carotid artery narrowing 12/22/2014   Cervical prolapse    CHF (congestive heart failure) (HCC)    CKD (chronic kidney disease)    Concussion 01-14-14   Fall    COPD (chronic obstructive pulmonary disease) (HCC)    Coronary artery disease    Depression    Diabetes mellitus without complication (HCC)    Dialysis patient (HCC)    Diplopia    Dupuytren's contracture of foot    Essential (primary) hypertension 02/12/2015   Family history of thyroid problem    GERD (gastroesophageal reflux disease)    Gout    Heart disease    enlarged because of COPD   Heart murmur    Hepatomegaly    HLD (hyperlipidemia)    Hyperactive airway disease    Hyperkalemia    Hypertension    Hyperthyroidism    Irritable colon    Morbid obesity (HCC)    Multiple thyroid nodules    OAB (overactive bladder)    Pernicious anemia    Post menopausal syndrome    Post-concussion syndrome 03/08/2014   Post-traumatic brain syndrome    Prolapsed uterus  PSVT (paroxysmal supraventricular tachycardia)    Renal calculus    Renal disease    w/ GFR 29-may be due to diabetes   Seizures (HCC)    last seizure 2012. has had spells since with no knowledge. 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    Transient alteration of awareness 11/10/2012   Urinary incontinence with continuous leakage 03/08/2014    Past Surgical History:  Procedure Laterality Date   A/V FISTULAGRAM Left 07/21/2016   Procedure: A/V Fistulagram;  Surgeon: Annice Needy, MD;  Location: [facility]IVE CV [facility];  Service: Cardiovascular;  Laterality: Left;   A/V  FISTULAGRAM Left 12/24/2016   Procedure: A/V Fistulagram;  Surgeon: Annice Needy, MD;  Location: [facility];  Service: Cardiovascular;  Laterality: Left;   A/V FISTULAGRAM Left 06/29/2017   Procedure: A/V FISTULAGRAM;  Surgeon: Annice Needy, MD;  Location: [facility];  Service: Cardiovascular;  Laterality: Left;   A/V FISTULAGRAM Left 03/08/2018   Procedure: A/V FISTULAGRAM;  Surgeon: Annice Needy, MD;  Location: [facility];  Service: Cardiovascular;  Laterality: Left;   A/V FISTULAGRAM Left 04/11/2019   Procedure: A/V FISTULAGRAM;  Surgeon: Annice Needy, MD;  Location: [facility];  Service: Cardiovascular;  Laterality: Left;   A/V SHUNT INTERVENTION N/A 07/21/2016   Procedure: A/V Shunt Intervention;  Surgeon: Annice Needy, MD;  Location: [facility];  Service: Cardiovascular;  Laterality: N/A;   A/V SHUNT INTERVENTION N/A 12/24/2016   Procedure: A/V SHUNT INTERVENTION;  Surgeon: Annice Needy, MD;  Location: [facility];  Service: Cardiovascular;  Laterality: N/A;   AVF     BREAST BIOPSY Left 06/12/2022   LEFT stereo bx, calcs, "X" clip-path pending   BREAST BIOPSY Left 06/12/2022   MM LT BREAST BX W LOC DEV 1ST LESION IMAGE BX SPEC STEREO GUIDE 06/12/2022 [facility]   CHOLECYSTECTOMY     COLONOSCOPY     COLONOSCOPY WITH PROPOFOL N/A 03/18/2017   Procedure: COLONOSCOPY WITH PROPOFOL;  Surgeon: Scot Jun, MD;  Location: [facility] ENDOSCOPY;  Service: Endoscopy;  Laterality: N/A;   ENDOMETRIAL BIOPSY     ablation, uterine   ESOPHAGOGASTRODUODENOSCOPY (EGD) WITH PROPOFOL N/A 03/18/2017   Procedure: ESOPHAGOGASTRODUODENOSCOPY (EGD) WITH PROPOFOL;  Surgeon: Scot Jun, MD;  Location: [facility] ENDOSCOPY;  Service: Endoscopy;  Laterality: N/A;   HERNIA REPAIR     HERNIA REPAIR     PERIPHERAL VASCULAR CATHETERIZATION N/A 08/23/2015   Procedure: Dialysis/Perma Catheter Insertion;  Surgeon: Annice Needy, MD;  Location: [facility]  [facility];  Service: Cardiovascular;  Laterality: N/A;   PERIPHERAL VASCULAR CATHETERIZATION N/A 11/09/2015   Procedure: Dialysis/Perma Catheter Removal;  Surgeon: Renford Dills, MD;  Location: [facility];  Service: Cardiovascular;  Laterality: N/A;   PERIPHERAL VASCULAR CATHETERIZATION Left 04/23/2016   Procedure: A/V Fistulagram;  Surgeon: Annice Needy, MD;  Location: [facility];  Service: Cardiovascular;  Laterality: Left;   PORT A CATH REVISION       Current Outpatient Medications on File Prior to Visit  Medication Sig Dispense Refill   AURYXIA 1 GM 210 MG(Fe) tablet Take 630 mg by mouth 3 (three) times daily.     b complex vitamins tablet Take 1 tablet by mouth at bedtime.      B Complex-C-Folic Acid (DIALYVITE TABLET) TABS Take 1 tablet  by mouth daily.  12   clonazePAM (KLONOPIN) 1 MG tablet TAKE 1-2 TABLETS (1-2 MG TOTAL) BY MOUTH DAILY. 90 tablet 3   cloNIDine (CATAPRES - DOSED IN MG/24 HR) 0.1 mg/24hr patch Place 0.1 mg onto the skin once a week.     Continuous Blood Gluc Sensor (DEXCOM G6 SENSOR) MISC Use 1 every 10 days     Continuous Blood Gluc Transmit (DEXCOM G6 TRANSMITTER) MISC USE EVERY 3 MONTHS     ethosuximide (ZARONTIN) 250 MG/5ML solution TAKE 7. 5 ML BY MOUTH TWICE A DAY TAKE AFTER HD TREATMENT ON DIALYSIS DAY. 250 mL 1   famotidine (PEPCID) 40 MG tablet SMARTSIG:1 Tablet(s) By Mouth Every Evening     Febuxostat 80 MG TABS Take 80 mg by mouth daily.     fexofenadine (ALLEGRA) 180 MG tablet Take 180 mg by mouth daily.     Fluticasone-Salmeterol (ADVAIR) 250-50 MCG/DOSE AEPB Inhale 1 puff into the lungs 2 (two) times daily.     furosemide (LASIX) 20 MG tablet Take 30 mg by mouth 2 (two) times daily.      HYDROcodone-acetaminophen (NORCO/VICODIN) 5-325 MG tablet Take 1 tablet by mouth every 6 (six) hours as needed.     insulin glargine (LANTUS) 100 UNIT/ML injection Inject 22 Units into the skin at bedtime. 16 units on Monday,Wednesday,Friday, 12 units  on dialysis days     insulin lispro (HUMALOG) 100 UNIT/ML injection Inject 8-15 Units into the skin 3 (three) times daily with meals. Per sliding scale     LAMICTAL 200 MG tablet Take 1.5 tablets (300 mg total) by mouth 2 (two) times daily. GLAXO BRAND NAME 270 tablet 1   levETIRAcetam (KEPPRA) 250 MG tablet Take Bid on non -dialysis days, 3 a day on hemodialysis days. 270 tablet 5   levothyroxine (SYNTHROID) 150 MCG tablet Take 150 mcg by mouth daily before breakfast.      lidocaine-prilocaine (EMLA) cream Apply topically as directed.     Methoxy PEG-Epoetin Beta (MIRCERA IJ) Inject as directed. Receives w/ dialysis, unsure how often right now     metoprolol succinate (TOPROL XL) 100 MG 24 hr tablet Take 1 tablet (100 mg total) by mouth in the morning and at bedtime. Take with or immediately following a meal. 180 tablet 3   montelukast (SINGULAIR) 10 MG tablet SMARTSIG:1 Tablet(s) By Mouth Every Evening     nystatin-triamcinolone (MYCOLOG II) cream Apply 1 application topically 2 (two) times daily. 30 g 1   pantoprazole (PROTONIX) 40 MG tablet Take 40 mg by mouth every morning.      Probiotic Product (ALIGN) 4 MG CAPS Take 4 mg by mouth daily.      sevelamer carbonate (RENVELA) 800 MG tablet Take 3,200 mg by mouth daily.     triamcinolone (NASACORT) 55 MCG/ACT AERO nasal inhaler Place 2 sprays into the nose at bedtime.      UNIFINE PENTIPS 32G X 4 MM MISC USE UP TO 5 TIMES DAILY AS DIRECTED  3   valsartan (DIOVAN) 80 MG tablet Take 1 tablet (80 mg total) by mouth daily. 180 tablet 3   No current facility-administered medications on file prior to visit.    Allergies  Allergen Reactions   Cinnamon Other (See Comments)    Raises blood pressure   Procrit [Epoetin Alfa-Epbx] Other (See Comments)    Seizures, grand mal   Ace Inhibitors Cough   Azithromycin Other (See Comments)    seizures   Iron Other (See Comments)    Can  not take in IV Form - causes seizures    Neomycin-Bacitracin  Zn-Polymyx Other (See Comments)    Unknown   Other    Sulfa Antibiotics Other (See Comments)    Due to risks for seizures.     DIAGNOSTIC DATA (LABS, IMAGING, TESTING) - I reviewed patient records, labs, notes, testing and imaging myself where available.  Lab Results  Component Value Date   WBC 12.3 (H) 09/05/2022   HGB 11.3 (L) 09/05/2022   HCT 34.4 (L) 09/05/2022   MCV 108.5 (H) 09/05/2022   PLT 139 (L) 09/05/2022      Component Value Date/Time   NA 140 09/05/2022 2109   NA 142 03/08/2014 1634   NA 141 07/04/2011 0747   K 3.8 09/05/2022 2109   K 4.6 07/04/2011 0747   CL 100 09/05/2022 2109   CL 109 (H) 07/04/2011 0747   CO2 30 09/05/2022 2109   CO2 20 (L) 07/04/2011 0747   GLUCOSE 184 (H) 09/05/2022 2109   GLUCOSE 198 (H) 07/04/2011 0747   BUN 17 09/05/2022 2109   BUN 82 (HH) 03/08/2014 1634   BUN 65 (H) 07/04/2011 0747   CREATININE 2.92 (H) 09/05/2022 2109   CREATININE 1.95 (H) 07/04/2011 0747   CALCIUM 8.4 (L) 09/05/2022 2109   CALCIUM 8.6 07/04/2011 0747   PROT 6.7 03/17/2022 1537   PROT 6.8 03/08/2014 1634   PROT 7.3 07/04/2011 0747   ALBUMIN 3.8 03/17/2022 1537   ALBUMIN 4.4 03/08/2014 1634   ALBUMIN 3.8 07/04/2011 0747   AST 23 03/17/2022 1537   AST 11 (L) 07/04/2011 0747   ALT 11 03/17/2022 1537   ALT 14 07/04/2011 0747   ALKPHOS 157 (H) 03/17/2022 1537   ALKPHOS 188 (H) 07/04/2011 0747   BILITOT 0.9 03/17/2022 1537   BILITOT 0.2 07/04/2011 0747   GFRNONAA 18 (L) 09/05/2022 2109   GFRNONAA 29 (L) 07/04/2011 0747   GFRAA 15 (L) 01/29/2019 1431   GFRAA 35 (L) 07/04/2011 0747   No results found for: "CHOL", "HDL", "LDLCALC", "LDLDIRECT", "TRIG", "CHOLHDL" No results found for: "HGBA1C" No results found for: "VITAMINB12" Lab Results  Component Value Date   TSH 2.373 04/10/2016    PHYSICAL EXAM:  Today's Vitals   12/02/22 1431  BP: (!) 159/65  Pulse: 66  Weight: 199 lb (90.3 kg)  Height: 5\' 2"  (1.575 m)   Body mass index is 36.4 kg/m.    Wt Readings from Last 3 Encounters:  12/02/22 199 lb (90.3 kg)  10/22/22 198 lb 6 oz (90 kg)  10/01/22 198 lb (89.8 kg)     Ht Readings from Last 3 Encounters:  12/02/22 5\' 2"  (1.575 m)  10/22/22 5\' 2"  (1.575 m)  10/01/22 5\' 2"  (1.575 m)      General: The patient is awake, alert and appears not in acute distress. The patient is well groomed. Head: Normocephalic, atraumatic. Neck is supple.  Mallampati 1 - there is no uvula , neck circumference:15.5 inches.    On renal low potassium diet, she lost 45 pounds, BMI 37 kg/m2  . Saturday  was hemodialysis day - 48 hours since last treatment.  She is hoarse today.  Thrill over left arm , AV fistula.    Cardiovascular:  Regular rate and rhythm, without  murmurs or carotid bruit, and without distended neck veins. Respiratory: Lungs are clear to auscultation. Skin: ankle  edema, no rash.  Looking bronzed .  Dry skin,  AV fistula.  Left arm, non dominant hand.  Trunk: BMI  is morbidly obese-   Abdominal hernia.  patient has normal posture.    Neurologic exam : The patient is awake and alert, oriented to place and time.she is subdued.  Memory subjective described as intact.  There is a normal attention span & concentration ability.  Speech is fluent with hoarseness.    Mood and affect are normal. Cranial nerves: Pupils are equal and slowly  reactive to light.  No disrounded pupils, beginning cloudiness, cataract right over left-  Yellowish sclerea.  Visual fields by finger perimetry are intact.Hearing to tuning fork  rub intact. Facial sensation intact to fine touch.  Facial motor strength is symmetric and tongue moves midline.   Motor exam:  reduced tone and normal muscle bulk, obesity related attenuated DTRs but not absent.  and symmetric normal strength in upper extremities.  Grip strength is weaker over the left - due to fistula.  She has her AV access fistula on the left arm. Thrill is palpable and loudly audible.  Her  family stated she is using the left arm less.   Has less grip strength on the left shoulder is droopy on the left- . Now with clumsiness more on the right as well. Tremor.  (She now showers while  seated).    Sensory: Fine touch,and vibration were tested in all extremities and are present in toes and feet. Proprioception is tested and normal.  Coordination:  Left arm with AV fistula and thrill- Rapid alternating movements in the fingers/hands is tested - . Finger-to-nose maneuver tested l with left side evidence of ataxia, dysmetria and a new non resting, action  tremor.    Gait and station: Patient walks without assistive device and is very slow -  Appears truncally rigid, with no lumbar rotation, turns with 5 steps, and has reduced step width.  . Stance is stable and of wider base. Tandem gait is fragmented. She shuffles.     Deep tendon reflexes: in the upper and lower extremities are symmetric and intact.  Babinski maneuver response is down going on the left and equivocal on the right.      ASSESSMENT AND PLAN 62 y.o. year old female  here with:  No further medication changes , keeping  the  changes from April.  Colonoscopy concerns about fluid load with prep.  Awaiting bone scan  results.    I plan to follow up either personally or through our NP within 6-8 months.   I would like to thank Marguarite Arbour, MD for allowing me to meet with and to take care of this pleasant patient.    After spending a total time of  25  minutes face to face and additional time for physical and neurologic examination, review of laboratory studies,  personal review of imaging studies, reports and results of other testing and review of referral information / records as far as provided in visit,   Electronically signed by: Melvyn Novas, MD 12/02/2022 2:41 PM  [facility] and [facility] Sleep Board certified by The [employer] of Sleep Medicine and Diplomate of the [employer] of Sleep Medicine. Board certified In Neurology through the ABPN, Fellow of the [employer] of Neurology.

## 2022-12-02 NOTE — Patient Instructions (Signed)
Ethosuximide Capsules What is this medication? ETHOSUXIMIDE (eth oh SUX i mide) prevents and controls seizures in people with epilepsy. It works by calming overactive nerves in your body. This medicine may be used for other purposes; ask your health care provider or pharmacist if you have questions. COMMON BRAND NAME(S): Zarontin What should I tell my care team before I take this medication? They need to know if you have any of these conditions: Kidney disease Liver disease Suicidal thoughts, plans or attempt An unusual or allergic reaction to ethosuximide, other medications, foods, dyes, or preservatives Pregnant or trying to get pregnant Breast-feeding How should I use this medication? Take this medication by mouth with water. Take it as directed on the prescription label at the same time every day. You can take it with or without food. If it upsets your stomach, take it with food. Keep taking it unless your care team tells you to stop. A special MedGuide will be given to you by the pharmacist with each prescription and refill. Be sure to read this information carefully each time. Talk to your care team about the use of this medication in children. While it may be prescribed for children as young as 3 years for selected conditions, precautions do apply. Overdosage: If you think you have taken too much of this medicine contact a poison control center or emergency room at once. NOTE: This medicine is only for you. Do not share this medicine with others. What if I miss a dose? If you miss a dose, take it as soon as you can. If it is almost time for your next dose, take only that dose. Do not take double or extra doses. What may interact with this medication? Phenytoin Valproic acid This list may not describe all possible interactions. Give your health care provider a list of all the medicines, herbs, non-prescription drugs, or dietary supplements you use. Also tell them if you smoke, drink  alcohol, or use illegal drugs. Some items may interact with your medicine. What should I watch for while using this medication? Visit your care team for regular checks on your progress. Tell your care team if your symptoms do not start to get better or if they get worse. Wear a medical ID bracelet or chain. Carry a card that describes your condition. List the medications and doses you take on the card. You may get drowsy or dizzy. Do not drive, use machinery, or do anything that needs mental alertness until you know how this medication affects you. Do not stand up or sit up quickly, especially if you are an older patient. This reduces the risk of dizzy or fainting spells. Alcohol may interfere with the effects of this medication. Avoid alcoholic drinks. If you or your family notice any changes in your behavior, such as new or worsening depression, thoughts of harming yourself, anxiety, other unusual or disturbing thoughts, or memory loss, call your care team right away. This medication may cause serious skin reactions. They can happen weeks to months after starting the medication. Contact your care team right away if you notice fevers or flu-like symptoms with a rash. The rash may be red or purple and then turn into blisters or peeling of the skin. Or, you might notice a red rash with swelling of the face, lips or lymph nodes in your neck or under your arms. What side effects may I notice from receiving this medication? Side effects that you should report to your care team as soon as  possible: Allergic reactions--skin rash, itching, hives, swelling of the face, lips, tongue, or throat Infection--fever, chills, cough, or sore throat Kidney injury--decrease in the amount of urine, swelling of the ankles, hands, or feet Liver injury--right upper belly pain, loss of appetite, nausea, light-colored stool, dark yellow or brown urine, yellowing skin or eyes, unusual weakness or fatigue Lupus-like  syndrome--joint pain, swelling, or stiffness, butterfly-shaped rash on the face, rashes that get worse in the sun, fever, unusual weakness or fatigue Rash, fever, and swollen lymph nodes Redness, blistering, peeling, or loosening of the skin, including inside the mouth Thoughts of suicide or self-harm, worsening mood, or feelings of depression Unusual bruising or bleeding Side effects that usually do not require medical attention (report to your care team if they continue or are bothersome): Diarrhea Dizziness Fatigue Hiccups Loss of appetite with weight loss Nausea This list may not describe all possible side effects. Call your doctor for medical advice about side effects. You may report side effects to FDA at 1-800-FDA-1088. Where should I keep my medication? Keep out of the reach of children and pets. Store at [employer] [facility] (77 degrees F). Get rid of any unused medication after the expiration date. To get rid of the medications that are no longer needed or have expired: Take the medication to a medication take-back program. Check with your pharmacy or law enforcement to find a location. If you cannot return the medication, check the label or package insert to see if the medication should be thrown out in the garbage or flushed down the toilet. If you are not sure, ask your care team. If it is safe to put it in the trash, empty the medication out of the container. Mix the medication with cat litter, dirt, coffee grounds, or other unwanted substance. Seal the mixture in a bag or container. Put it in the trash. NOTE: This sheet is a summary. It may not cover all possible information. If you have questions about this medicine, talk to your doctor, pharmacist, or health care provider.  2024 Elsevier/Gold Standard (2020-12-06 00:00:00)

## 2022-12-09 ENCOUNTER — Ambulatory Visit (INDEPENDENT_AMBULATORY_CARE_PROVIDER_SITE_OTHER): Payer: Medicare Other | Admitting: Vascular Surgery

## 2022-12-09 ENCOUNTER — Ambulatory Visit (INDEPENDENT_AMBULATORY_CARE_PROVIDER_SITE_OTHER): Payer: Medicare Other

## 2022-12-09 VITALS — BP 135/74 | HR 73 | Resp 18 | Ht 62.0 in | Wt 197.0 lb

## 2022-12-09 DIAGNOSIS — Z992 Dependence on renal dialysis: Secondary | ICD-10-CM

## 2022-12-09 DIAGNOSIS — N186 End stage renal disease: Secondary | ICD-10-CM

## 2022-12-09 DIAGNOSIS — E1122 Type 2 diabetes mellitus with diabetic chronic kidney disease: Secondary | ICD-10-CM | POA: Diagnosis not present

## 2022-12-09 DIAGNOSIS — I10 Essential (primary) hypertension: Secondary | ICD-10-CM

## 2022-12-09 NOTE — Progress Notes (Signed)
Carelink Summary Report / Loop Recorder 

## 2022-12-09 NOTE — Assessment & Plan Note (Signed)
Duplex today shows some elevated velocities at the radiocephalic anastomosis but otherwise with a good flow volume and no signs of other issues within the AV fistula.  No role for intervention at this time with a fistula that is working well and does not have any clear or obvious severe problems.  Recommend follow-up in 6 months with noninvasive studies.

## 2022-12-09 NOTE — Progress Notes (Signed)
MRN : [id number]  Nina Johnston is a 62 y.o. (1961-01-12) female who presents with chief complaint of  Chief Complaint  Patient presents with   Venous Insufficiency  .  History of Present Illness: Patient returns today in follow up of her end-stage renal disease and the AV fistula in her left arm.  She has been using this fistula for several years.  It seems to be working well with minimal access issues.  She has had good flow rates and no clotting.  No prolonged bleeding.  Duplex today shows some elevated velocities at the radiocephalic anastomosis but otherwise with a good flow volume and no signs of other issues within the AV fistula.  Current Outpatient Medications  Medication Sig Dispense Refill   AURYXIA 1 GM 210 MG(Fe) tablet Take 630 mg by mouth 3 (three) times daily.     b complex vitamins tablet Take 1 tablet by mouth at bedtime.      B Complex-C-Folic Acid (DIALYVITE TABLET) TABS Take 1 tablet by mouth daily.  12   chlorpheniramine-HYDROcodone (TUSSIONEX) 10-8 MG/5ML TAKE 5 ML BY MOUTH EVERY 12 HOURS AS NEEDED FOR COUGH     clonazePAM (KLONOPIN) 1 MG tablet TAKE 1-2 TABLETS (1-2 MG TOTAL) BY MOUTH DAILY. 90 tablet 3   cloNIDine (CATAPRES - DOSED IN MG/24 HR) 0.1 mg/24hr patch Place 0.1 mg onto the skin once a week.     Continuous Blood Gluc Sensor (DEXCOM G6 SENSOR) MISC Use 1 every 10 days     Continuous Blood Gluc Transmit (DEXCOM G6 TRANSMITTER) MISC USE EVERY 3 MONTHS     ethosuximide (ZARONTIN) 250 MG/5ML solution TAKE 7. 5 ML BY MOUTH TWICE A DAY TAKE AFTER HD TREATMENT ON DIALYSIS DAY. 250 mL 1   famotidine (PEPCID) 40 MG tablet SMARTSIG:1 Tablet(s) By Mouth Every Evening     Febuxostat 80 MG TABS Take 80 mg by mouth daily.     fexofenadine (ALLEGRA) 180 MG tablet Take 180 mg by mouth daily.     Fluticasone-Salmeterol (ADVAIR) 250-50 MCG/DOSE AEPB Inhale 1 puff into the lungs 2 (two) times daily.     furosemide (LASIX) 20 MG tablet Take 30 mg by mouth 2 (two) times  daily.      Glucagon, rDNA, (GLUCAGON EMERGENCY) 1 MG KIT INJECT 1 MG INTO THE MUSCLE AS DIRECTED     insulin glargine (LANTUS) 100 UNIT/ML injection Inject 22 Units into the skin at bedtime. 16 units on Monday,Wednesday,Friday, 12 units on dialysis days     insulin lispro (HUMALOG) 100 UNIT/ML injection Inject 8-15 Units into the skin 3 (three) times daily with meals. Per sliding scale     LAMICTAL 200 MG tablet Take 1.5 tablets (300 mg total) by mouth 2 (two) times daily. GLAXO BRAND NAME 270 tablet 1   levETIRAcetam (KEPPRA) 250 MG tablet Take Bid on non -dialysis days, 3 a day on hemodialysis days. 270 tablet 5   levothyroxine (SYNTHROID) 150 MCG tablet Take 150 mcg by mouth daily before breakfast.      lidocaine-prilocaine (EMLA) cream Apply topically as directed.     Methoxy PEG-Epoetin Beta (MIRCERA IJ) Inject as directed. Receives w/ dialysis, unsure how often right now     metoprolol succinate (TOPROL XL) 100 MG 24 hr tablet Take 1 tablet (100 mg total) by mouth in the morning and at bedtime. Take with or immediately following a meal. 180 tablet 3   montelukast (SINGULAIR) 10 MG tablet SMARTSIG:1 Tablet(s) By Mouth Every Evening  nystatin-triamcinolone (MYCOLOG II) cream Apply 1 application topically 2 (two) times daily. 30 g 1   pantoprazole (PROTONIX) 40 MG tablet Take 40 mg by mouth every morning.      Probiotic Product (ALIGN) 4 MG CAPS Take 4 mg by mouth daily.      sevelamer carbonate (RENVELA) 800 MG tablet Take 3,200 mg by mouth daily.     UNIFINE PENTIPS 32G X 4 MM MISC USE UP TO 5 TIMES DAILY AS DIRECTED  3   valsartan (DIOVAN) 80 MG tablet Take 1 tablet (80 mg total) by mouth daily. 180 tablet 3   No current facility-administered medications for this visit.    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  On multiple psychotropic medications, and with DM>    Benign essential HTN 12/26/2014   Bipolar disorder (HCC)    Bronchitis    Carotid artery narrowing 12/22/2014   Cervical prolapse    CHF (congestive heart failure) (HCC)    CKD (chronic kidney disease)    Concussion 01-14-14   Fall    COPD (chronic obstructive pulmonary disease) (HCC)    Coronary artery disease    Depression    Diabetes mellitus without complication (HCC)    Dialysis patient (HCC)    Diplopia    Dupuytren's contracture of foot    Essential (primary) hypertension 02/12/2015   Family history of thyroid problem    GERD (gastroesophageal reflux disease)    Gout    Heart disease    enlarged because of COPD   Heart murmur    Hepatomegaly    HLD (hyperlipidemia)    Hyperactive airway disease    Hyperkalemia    Hypertension    Hyperthyroidism    Irritable colon    Morbid obesity (HCC)    Multiple thyroid nodules    OAB (overactive bladder)    Pernicious anemia    Post menopausal syndrome    Post-concussion syndrome 03/08/2014   Post-traumatic brain syndrome    Prolapsed uterus    PSVT (paroxysmal supraventricular tachycardia)    Renal calculus    Renal disease    w/ GFR 29-may be due to diabetes   Seizures (HCC)    last seizure 2012. has had spells since with no knowledge. 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    Transient alteration of awareness 11/10/2012   Urinary incontinence with continuous leakage 03/08/2014    Past Surgical History:  Procedure Laterality Date   A/V FISTULAGRAM Left 07/21/2016   Procedure: A/V Fistulagram;  Surgeon: Annice Needy, MD;  Location: [facility] INVASIVE CV LAB;  Service: Cardiovascular;  Laterality: Left;   A/V FISTULAGRAM Left 12/24/2016   Procedure: A/V Fistulagram;  Surgeon: Annice Needy, MD;  Location: [facility] INVASIVE CV LAB;  Service:  Cardiovascular;  Laterality: Left;   A/V FISTULAGRAM Left 06/29/2017   Procedure: A/V FISTULAGRAM;  Surgeon: Annice Needy, MD;  Location: [facility] INVASIVE CV LAB;  Service: Cardiovascular;  Laterality: Left;   A/V FISTULAGRAM Left 03/08/2018   Procedure: A/V FISTULAGRAM;  Surgeon: Annice Needy, MD;  Location: [facility] INVASIVE  CV LAB;  Service: Cardiovascular;  Laterality: Left;   A/V FISTULAGRAM Left 04/11/2019   Procedure: A/V FISTULAGRAM;  Surgeon: Annice Needy, MD;  Location: [facility] INVASIVE CV LAB;  Service: Cardiovascular;  Laterality: Left;   A/V SHUNT INTERVENTION N/A 07/21/2016   Procedure: A/V Shunt Intervention;  Surgeon: Annice Needy, MD;  Location: [facility] INVASIVE CV LAB;  Service: Cardiovascular;  Laterality: N/A;   A/V SHUNT INTERVENTION N/A 12/24/2016   Procedure: A/V SHUNT INTERVENTION;  Surgeon: Annice Needy, MD;  Location: [facility] INVASIVE CV LAB;  Service: Cardiovascular;  Laterality: N/A;   AVF     BREAST BIOPSY Left 06/12/2022   LEFT stereo bx, calcs, "X" clip-path pending   BREAST BIOPSY Left 06/12/2022   MM LT BREAST BX W LOC DEV 1ST LESION IMAGE BX SPEC STEREO GUIDE 06/12/2022 [facility]   CHOLECYSTECTOMY     COLONOSCOPY     COLONOSCOPY WITH PROPOFOL N/A 03/18/2017   Procedure: COLONOSCOPY WITH PROPOFOL;  Surgeon: Scot Jun, MD;  Location[facility] ENDOSCOPY;  Service: Endoscopy;  Laterality: N/A;   ENDOMETRIAL BIOPSY     ablation, uterine   ESOPHAGOGASTRODUODENOSCOPY (EGD) WITH PROPOFOL N/A 03/18/2017   Procedure: ESOPHAGOGASTRODUODENOSCOPY (EGD) WITH PROPOFOL;  Surgeon: Scot Jun, MD;  Location: [facility] ENDOSCOPY;  Service: Endoscopy;  Laterality: N/A;   HERNIA REPAIR     HERNIA REPAIR     PERIPHERAL VASCULAR CATHETERIZATION N/A 08/23/2015   Procedure: Dialysis/Perma Catheter Insertion;  Surgeon: Annice Needy, MD;  Location: [facility] INVASIVE CV LAB;  Service: Cardiovascular;  Laterality: N/A;   PERIPHERAL VASCULAR CATHETERIZATION N/A 11/09/2015   Procedure:  Dialysis/Perma Catheter Removal;  Surgeon: Renford Dills, MD;  Location: [facility] INVASIVE CV LAB;  Service: Cardiovascular;  Laterality: N/A;   PERIPHERAL VASCULAR CATHETERIZATION Left 04/23/2016   Procedure: A/V Fistulagram;  Surgeon: Annice Needy, MD;  Location: [facility] INVASIVE CV LAB;  Service: Cardiovascular;  Laterality: Left;   PORT A CATH REVISION       Social History   Tobacco Use   Smoking status: Never   Smokeless tobacco: Never  Vaping Use   Vaping status: Never Used  Substance Use Topics   Alcohol use: No   Drug use: No       Family History  Problem Relation Age of Onset   Diabetes Mother    Lumbar disc disease Mother    Migraines Mother    Hypertension Mother    Cancer - Prostate Father        mild   Diabetes Father    Hypertension Father    Breast cancer Neg Hx    Ovarian cancer Neg Hx    Colon cancer Neg Hx    Heart disease Neg Hx    Kidney cancer Neg Hx    Bladder Cancer Neg Hx      Allergies  Allergen Reactions   Cinnamon Other (See Comments)    Raises blood pressure   Procrit [Epoetin Alfa-Epbx] Other (See Comments)    Seizures, grand mal   Ace Inhibitors Cough   Azithromycin Other (See Comments)    seizures   Iron Other (See Comments)    Can not take in IV Form - causes seizures    Neomycin-Bacitracin Zn-Polymyx Other (See Comments)    Unknown   Other    Sulfa Antibiotics Other (See Comments)    Due to risks for seizures.   REVIEW OF SYSTEMS (Negative unless checked)   Constitutional: [] Weight loss  [] Fever  [] Chills Cardiac: [] Chest pain   []   Chest pressure   [] Palpitations   [] Shortness of breath when laying flat   [] Shortness of breath at rest   [] Shortness of breath with exertion. Vascular:  [] Pain in legs with walking   [] Pain in legs at rest   [] Pain in legs when laying flat   [] Claudication   [] Pain in feet when walking  [] Pain in feet at rest  [] Pain in feet when laying flat   [] History of DVT   [] Phlebitis   [x] Swelling in legs    [] Varicose veins   [] Non-healing ulcers Pulmonary:   [] Uses home oxygen   [] Productive cough   [] Hemoptysis   [] Wheeze  [] COPD   [] Asthma Neurologic:  [] Dizziness  [] Blackouts   [] Seizures   [] History of stroke   [] History of TIA  [] Aphasia   [] Temporary blindness   [] Dysphagia   [] Weakness or numbness in arms   [] Weakness or numbness in legs Musculoskeletal:  [] Arthritis   [] Joint swelling   [] Joint pain   [] Low back pain Hematologic:  [x] Easy bruising  [x] Easy bleeding   [] Hypercoagulable state   [x] Anemic   Gastrointestinal:  [] Blood in stool   [] Vomiting blood  [] Gastroesophageal reflux/heartburn   [] Abdominal pain Genitourinary:  [x] Chronic kidney disease   [] Difficult urination  [] Frequent urination  [] Burning with urination   [] Hematuria Skin:  [] Rashes   [] Ulcers   [] Wounds Psychological:  [] History of anxiety   []  History of major depression.  Physical Examination  BP 135/74 (BP Location: Right Arm)   Pulse 73   Resp 18   Ht 5\' 2"  (1.575 m)   Wt 197 lb (89.4 kg)   BMI 36.03 kg/m  Gen:  WD/WN, NAD Head: [place]/AT, No temporalis wasting. Ear/Nose/Throat: Hearing grossly intact, nares w/o erythema or drainage Eyes: Conjunctiva clear. Sclera non-icteric Neck: Supple.  Trachea midline Pulmonary:  Good air movement, no use of accessory muscles.  Cardiac: RRR, no JVD Vascular: Left radiocephalic AV fistula is mildly aneurysmal with good thrill. Vessel Right Left  Radial Palpable Palpable           Musculoskeletal: M/S 5/5 throughout.  No deformity or atrophy.  Neurologic: Sensation grossly intact in extremities.  Symmetrical.  Speech is fluent.  Psychiatric: Judgment intact, Mood & affect appropriate for pt's clinical situation. Dermatologic: No rashes or ulcers noted.  No cellulitis or open wounds.      Labs Recent Results (from the past 2160 hour(s))  CUP PACEART REMOTE DEVICE CHECK     Status: None   Collection Time: 11/26/22  9:54 PM  Result Value Ref Range   Date Time  Interrogation Session [id number]    Pulse Generator Manufacturer MERM    Pulse Gen Model [id number] Donato Heinz    Pulse Gen Serial Number [id number]    Clinic [facility]    Implantable Pulse Generator Type ICM/ILR    Implantable Pulse Generator Implant Date [id number]     Radiology CUP PACEART REMOTE DEVICE CHECK  Result Date: 11/27/2022 ILR summary report received. Battery status OK. Normal device function. No new symptom, tachy, brady, or pause episodes. No new AF episodes. Monthly summary reports and ROV/PRN.  1 false pause with implant 6/19 [place], CVRS  DG BONE DENSITY (DXA)  Result Date: 11/18/2022 EXAM: DUAL X-RAY ABSORPTIOMETRY (DXA) FOR BONE MINERAL DENSITY IMPRESSION: Dear Dr Vickey Huger, Your patient OLUWASEUN TANDE completed a FRAX assessment on 11/18/2022 using the Lunar iDXA DXA System (analysis version: 14.10) manufactured by [employer]. The following summarizes the results of our evaluation. PATIENT BIOGRAPHICAL: Name:  Malaysha, Tkaczyk Patient ID: [id number] Birth Date: 1960-11-04 Height:    62.0 in. Gender:     [age]    [age]       Weight:    198.4 lbs. Ethnicity:  White                            Exam Date: 11/18/2022 FRAX* RESULTS:  (version: 3.5) 10-year Probability of Fracture1 Major Osteoporotic Fracture2 Hip Fracture 15.9% 2.2% Population: [place] (Caucasian) Risk Factors: History of Fracture (Adult) Based on Femur (Right) Neck BMD 1 -The 10-year probability of fracture may be lower than reported if the patient has received treatment. 2 -Major Osteoporotic Fracture: Clinical Spine, Forearm, Hip or Shoulder *FRAX is a Armed forces logistics/support/administrative officer of the [employer] of [employer] for Metabolic Bone Disease, a World Science writer (WHO) [employer]. ASSESSMENT: The probability of a major osteoporotic fracture is 15.9% within the next ten years. The probability of a hip fracture is 2.2% within the next ten years. Your patient Johann Kirkham completed a BMD  test on 11/18/2022 using the [employer] DXA System (software version: 14.10) manufactured by [employer]. The following summarizes the results of our evaluation. Technologist: PATIENT BIOGRAPHICAL: Name: Chinelo, Hemric Patient ID: [id number] Birth Date: 1960/09/24 Height: 62.0 in. Gender: Female Exam Date: 11/18/2022 Weight: 198.4 lbs. Indications: Caucasian, History of Fracture (Adult), History of kidney disease, Hypothyroid Fractures: Forearm Left Treatments: DENSITOMETRY RESULTS: Site      Region     Measured Date Measured Age WHO Classification Young Adult T-score BMD         %Change vs. Previous Significant Change (*) AP Spine L2-L4 11/18/2022 62.8 Normal 1.0 1.338 g/cm2 - - DualFemur Neck Right 11/18/2022 62.8 Osteopenia -2.0 0.759 g/cm2 - - DualFemur Total Mean 11/18/2022 62.8 Osteopenia -1.4 0.830 g/cm2 - - ASSESSMENT: The BMD measured at Femur Neck Right is 0.759 g/cm2 with a T-score of -2.0. This patient is considered osteopenic according to World Health Organization [facility]) criteria. The scan quality is good. L-1 was excluded due to degenerative changes. World Science writer [facility]) criteria for post-menopausal, Caucasian Women: Normal:                   T-score at or above -1 SD Osteopenia/low bone mass: T-score between -1 and -2.5 SD Osteoporosis:             T-score at or below -2.5 SD RECOMMENDATIONS: 1. All patients should optimize calcium and vitamin D intake. 2. Consider FDA-approved medical therapies in postmenopausal women and men aged 101 years and older, based on the following: a. A hip or vertebral(clinical or morphometric) fracture b. T-score < -2.5 at the femoral neck or spine after appropriate evaluation to exclude secondary causes c. Low bone mass (T-score between -1.0 and -2.5 at the femoral neck or spine) and a 10-year probability of a hip fracture > 3% or a 10-year probability of a major osteoporosis-related fracture > 20% based on the [place]-adapted WHO algorithm 3.  Clinician judgment and/or patient preferences may indicate treatment for people with 10-year fracture probabilities above or below these levels FOLLOW-UP: People with diagnosed cases of osteoporosis or at high risk for fracture should have regular bone mineral density tests. For patients eligible for Medicare, routine testing is allowed once every 2 years. The testing frequency can be increased to one year for patients who have rapidly progressing disease, those  who are receiving or discontinuing medical therapy to restore bone mass, or have additional risk factors. I have reviewed this report, and agree with the above findings. [facility] Electronically Signed   By: Frederico Hamman M.D.   On: 11/18/2022 15:03    Assessment/Plan  End-stage renal disease on hemodialysis (HCC) Duplex today shows some elevated velocities at the radiocephalic anastomosis but otherwise with a good flow volume and no signs of other issues within the AV fistula.  No role for intervention at this time with a fistula that is working well and does not have any clear or obvious severe problems.  Recommend follow-up in 6 months with noninvasive studies.  Type 2 diabetes mellitus (HCC) blood glucose control important in reducing the progression of atherosclerotic disease. Also, involved in wound healing. On appropriate medications.     Essential (primary) hypertension blood pressure control important in reducing the progression of atherosclerotic disease. On appropriate oral medications.   Festus Barren, MD  12/09/2022 3:09 PM    This note was created with Dragon medical transcription system.  Any errors from dictation are purely unintentional

## 2022-12-29 ENCOUNTER — Ambulatory Visit (INDEPENDENT_AMBULATORY_CARE_PROVIDER_SITE_OTHER): Payer: Medicare Other

## 2022-12-29 DIAGNOSIS — I119 Hypertensive heart disease without heart failure: Secondary | ICD-10-CM | POA: Diagnosis not present

## 2022-12-30 LAB — CUP PACEART REMOTE DEVICE CHECK
Date Time Interrogation Session: 20240826215418
Implantable Pulse Generator Implant Date: 20240619

## 2023-01-07 NOTE — Progress Notes (Signed)
Carelink Summary Report / Loop Recorder 

## 2023-01-12 NOTE — Progress Notes (Unsigned)
Virtual Visit via Telephone Note   Because of Nina Johnston's co-morbid illnesses, she is at least at moderate risk for complications without adequate follow up.  This format is felt to be most appropriate for this patient at this time.  The patient did not have access to video technology/had technical difficulties with video requiring transitioning to audio format only (telephone).  All issues noted in this document were discussed and addressed.  No physical exam could be performed with this format.  Please refer to the patient's chart for her consent to telehealth for [facility].  Evaluation Performed:  Preoperative cardiovascular risk assessment _____________   Date:  01/12/2023   Patient ID:  Nina Johnston, DOB Feb 06, 1961, MRN [id number] Patient Location:  Home Provider location:   Office  Primary Care Provider:  Marguarite Arbour, MD Primary Cardiologist:  None  Chief Complaint / Patient Profile   62 y.o. y/o female with end-stage renal disease, SVT, hypertension and bipolar.  Loop recorder did not reveal episodes of atrial fibrillation.   She is pending colonoscopy on 02/03/2023, with [facility],  and presents today for telephonic preoperative cardiovascular risk assessment.  History of Present Illness    Nina Johnston is a 62 y.o. female who presents via audio/video conferencing for a telehealth visit today.  Pt was last seen in cardiology clinic on 10/22/2022 by Dr. Chryl Heck.  At that time TAREVA LEVEN was doing well Has ILR with no recorded episodes of atrial fib..  The patient is now pending procedure as outlined above. 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  On  multiple psychotropic medications, and with DM>    Benign essential HTN 12/26/2014   Bipolar disorder (HCC)    Bronchitis    Carotid artery narrowing 12/22/2014   Cervical prolapse    CHF (congestive heart failure) (HCC)    CKD (chronic kidney disease)    Concussion 01-14-14   Fall    COPD (chronic obstructive pulmonary disease) (HCC)    Coronary artery disease    Depression    Diabetes mellitus without complication (HCC)    Dialysis patient (HCC)    Diplopia    Dupuytren's contracture of foot    Essential (primary) hypertension 02/12/2015   Family history of thyroid problem    GERD (gastroesophageal reflux disease)    Gout    Heart disease    enlarged because of COPD   Heart murmur    Hepatomegaly    HLD (hyperlipidemia)    Hyperactive airway disease    Hyperkalemia    Hypertension    Hyperthyroidism    Irritable colon    Morbid obesity (HCC)    Multiple thyroid nodules    OAB (overactive bladder)    Pernicious anemia    Post menopausal syndrome    Post-concussion syndrome 03/08/2014   Post-traumatic brain syndrome    Prolapsed uterus    PSVT (paroxysmal supraventricular tachycardia)    Renal calculus    Renal disease    w/ GFR 29-may be due to diabetes   Seizures (HCC)    last seizure 2012.  has had spells since with no knowledge. 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    Transient alteration of awareness 11/10/2012   Urinary incontinence with continuous leakage 03/08/2014   Past Surgical History:  Procedure Laterality Date   A/V FISTULAGRAM Left 07/21/2016   Procedure: A/V Fistulagram;  Surgeon: Annice Needy, MD;  Location: [facility] INVASIVE CV LAB;  Service: Cardiovascular;  Laterality: Left;   A/V FISTULAGRAM Left 12/24/2016   Procedure: A/V Fistulagram;  Surgeon: Annice Needy, MD;  Location: [facility];  Service: Cardiovascular;  Laterality: Left;   A/V FISTULAGRAM Left 06/29/2017    Procedure: A/V FISTULAGRAM;  Surgeon: Annice Needy, MD;  Location: [facility];  Service: Cardiovascular;  Laterality: Left;   A/V FISTULAGRAM Left 03/08/2018   Procedure: A/V FISTULAGRAM;  Surgeon: Annice Needy, MD;  Location: [facility];  Service: Cardiovascular;  Laterality: Left;   A/V FISTULAGRAM Left 04/11/2019   Procedure: A/V FISTULAGRAM;  Surgeon: Annice Needy, MD;  Location: [facility];  Service: Cardiovascular;  Laterality: Left;   A/V SHUNT INTERVENTION N/A 07/21/2016   Procedure: A/V Shunt Intervention;  Surgeon: Annice Needy, MD;  Location: [facility];  Service: Cardiovascular;  Laterality: N/A;   A/V SHUNT INTERVENTION N/A 12/24/2016   Procedure: A/V SHUNT INTERVENTION;  Surgeon: Annice Needy, MD;  Location: [facility];  Service: Cardiovascular;  Laterality: N/A;   AVF     BREAST BIOPSY Left 06/12/2022   LEFT stereo bx, calcs, "X" clip-path pending   BREAST BIOPSY Left 06/12/2022   MM LT BREAST BX W LOC DEV 1ST LESION IMAGE BX SPEC STEREO GUIDE 06/12/2022 [facility]   CHOLECYSTECTOMY     COLONOSCOPY     COLONOSCOPY WITH PROPOFOL N/A 03/18/2017   Procedure: COLONOSCOPY WITH PROPOFOL;  Surgeon: Scot Jun, MD;  Location: [facility] ENDOSCOPY;  Service: Endoscopy;  Laterality: N/A;   ENDOMETRIAL BIOPSY     ablation, uterine   ESOPHAGOGASTRODUODENOSCOPY (EGD) WITH PROPOFOL N/A 03/18/2017   Procedure: ESOPHAGOGASTRODUODENOSCOPY (EGD) WITH PROPOFOL;  Surgeon: Scot Jun, MD;  Location[facility] ENDOSCOPY;  Service: Endoscopy;  Laterality: N/A;   HERNIA REPAIR     HERNIA REPAIR     PERIPHERAL VASCULAR CATHETERIZATION N/A 08/23/2015   Procedure: Dialysis/Perma Catheter Insertion;  Surgeon: Annice Needy, MD;  Location: [facility];  Service: Cardiovascular;  Laterality: N/A;   PERIPHERAL VASCULAR CATHETERIZATION N/A 11/09/2015   Procedure: Dialysis/Perma Catheter Removal;  Surgeon: Renford Dills, MD;  Location: [facility]  INV[facility]IVE CV LAB;  Service: Cardiovascular;  Laterality: N/A;   PERIPHERAL VASCULAR CATHETERIZATION Left 04/23/2016   Procedure: A/V Fistulagram;  Surgeon: Annice Needy, MD;  Location: [facility];  Service: Cardiovascular;  Laterality: Left;   PORT A CATH REVISION      Allergies  Allergies  Allergen Reactions   Cinnamon Other (See Comments)    Raises blood pressure   Procrit [Epoetin Alfa-Epbx] Other (See Comments)    Seizures, grand mal   Ace Inhibitors Cough   Azithromycin Other (See Comments)    seizures   Iron Other (See Comments)    Can not take in IV Form - causes seizures    Neomycin-Bacitracin Zn-Polymyx Other (See Comments)    Unknown   Other    Sulfa Antibiotics Other (See Comments)    Due to risks for  seizures.    Home Medications    Prior to Admission medications   Medication Sig Start Date End Date Taking? Authorizing Provider  AURYXIA 1 GM 210 MG(Fe) tablet Take 630 mg by mouth 3 (three) times daily. 07/23/22   [provider]  b complex vitamins tablet Take 1 tablet by mouth at bedtime.     [provider]  B Complex-C-Folic Acid (DIALYVITE TABLET) TABS Take 1 tablet by mouth daily. 11/14/17   [provider]  chlorpheniramine-HYDROcodone (TUSSIONEX) 10-8 MG/5ML TAKE 5 ML BY MOUTH EVERY 12 HOURS AS NEEDED FOR COUGH 10/30/22   [provider]  clonazePAM (KLONOPIN) 1 MG tablet TAKE 1-2 TABLETS (1-2 MG TOTAL) BY MOUTH DAILY. 11/21/22   Dohmeier, Porfirio Mylar, MD  cloNIDine (CATAPRES - DOSED IN MG/24 HR) 0.1 mg/24hr patch Place 0.1 mg onto the skin once a week.    [provider]  Continuous Blood Gluc Sensor (DEXCOM G6 SENSOR) MISC Use 1 every 10 days 07/31/20   [provider]  Continuous Blood Gluc Transmit (DEXCOM G6 TRANSMITTER) MISC USE EVERY 3 MONTHS 11/06/20   [provider]  ethosuximide (ZARONTIN) 250 MG/5ML solution TAKE 7. 5 ML BY MOUTH TWICE A DAY TAKE AFTER HD TREATMENT ON DIALYSIS DAY. 11/05/22    Dohmeier, Porfirio Mylar, MD  famotidine (PEPCID) 40 MG tablet SMARTSIG:1 Tablet(s) By Mouth Every Evening 10/04/20   [provider]  Febuxostat 80 MG TABS Take 80 mg by mouth daily.    [provider]  fexofenadine (ALLEGRA) 180 MG tablet Take 180 mg by mouth daily.    [provider]  Fluticasone-Salmeterol (ADVAIR) 250-50 MCG/DOSE AEPB Inhale 1 puff into the lungs 2 (two) times daily.    [provider]  furosemide (LASIX) 20 MG tablet Take 30 mg by mouth 2 (two) times daily.     [provider]  Glucagon, rDNA, (GLUCAGON EMERGENCY) 1 MG KIT INJECT 1 MG INTO THE MUSCLE AS DIRECTED 10/30/22   [provider]  insulin glargine (LANTUS) 100 UNIT/ML injection Inject 22 Units into the skin at bedtime. 16 units on Monday,Wednesday,Friday, 12 units on dialysis days    [provider]  insulin lispro (HUMALOG) 100 UNIT/ML injection Inject 8-15 Units into the skin 3 (three) times daily with meals. Per sliding scale    [provider]  LAMICTAL 200 MG tablet Take 1.5 tablets (300 mg total) by mouth 2 (two) times daily. Heide Guile NAME 08/12/22   Dohmeier, Porfirio Mylar, MD  levETIRAcetam (KEPPRA) 250 MG tablet Take Bid on non -dialysis days, 3 a day on hemodialysis days. 08/12/22   Dohmeier, Porfirio Mylar, MD  levothyroxine (SYNTHROID) 150 MCG tablet Take 150 mcg by mouth daily before breakfast.     [provider]  lidocaine-prilocaine (EMLA) cream Apply topically as directed. 01/15/20   [provider]  Methoxy PEG-Epoetin Beta (MIRCERA IJ) Inject as directed. Receives w/ dialysis, unsure how often right now    [provider]  metoprolol succinate (TOPROL XL) 100 MG 24 hr tablet Take 1 tablet (100 mg total) by mouth in the morning and at bedtime. Take with or immediately following a meal. 10/01/22   Lanier Prude, MD  montelukast (SINGULAIR) 10 MG tablet SMARTSIG:1 Tablet(s) By Mouth Every Evening 12/05/20   [provider]   nystatin-triamcinolone (MYCOLOG II) cream Apply 1 application topically 2 (two) times daily. 10/22/16   Defrancesco, Prentice Docker, MD  pantoprazole (PROTONIX) 40 MG tablet Take 40 mg by mouth every morning.  [provider]  Probiotic Product (ALIGN) 4 MG CAPS Take 4 mg by mouth daily.     [provider]  sevelamer carbonate (RENVELA) 800 MG tablet Take 3,200 mg by mouth daily. 11/07/20   [provider]  UNIFINE PENTIPS 32G X 4 MM MISC USE UP TO 5 TIMES DAILY AS DIRECTED 11/22/17   [provider]  valsartan (DIOVAN) 80 MG tablet Take 1 tablet (80 mg total) by mouth daily. 10/01/22   Lanier Prude, MD    Physical Exam    Vital Signs:  MELESSA ECKELMAN does not have vital signs available for review today.***  Given telephonic nature of communication, physical exam is limited. AAOx3. NAD. Normal affect.  Speech and respirations are unlabored.  Accessory Clinical Findings    None  Assessment & Plan    1.  Preoperative Cardiovascular Risk Assessment:  According to the Revised Cardiac Risk Index (RCRI), her    Her   according to the Duke Activity Status Index (DASI).   The patient was advised that if she develops new symptoms prior to surgery to contact our office to arrange for a follow-up visit, and she verbalized understanding.  Patient is not on anticoagulation or antiplatelet mediations   Therefore, based on ACC/AHA guidelines, patient would be at acceptable risk for the planned procedure without further cardiovascular testing. I will route this recommendation to the requesting party via Epic fax function.   A copy of this note will be routed to requesting surgeon.  Time:   Today, I have spent *** minutes with the patient with telehealth technology discussing medical history, symptoms, and management plan.     Joni Reining, NP  01/12/2023, 2:03 PM

## 2023-01-13 ENCOUNTER — Ambulatory Visit: Payer: Medicare Other | Attending: Cardiology

## 2023-01-13 DIAGNOSIS — Z01810 Encounter for preprocedural cardiovascular examination: Secondary | ICD-10-CM | POA: Diagnosis not present

## 2023-01-13 DIAGNOSIS — Z01818 Encounter for other preprocedural examination: Secondary | ICD-10-CM

## 2023-01-19 ENCOUNTER — Telehealth: Payer: Self-pay | Admitting: Neurology

## 2023-01-19 DIAGNOSIS — R4789 Other speech disturbances: Secondary | ICD-10-CM

## 2023-01-19 DIAGNOSIS — R270 Ataxia, unspecified: Secondary | ICD-10-CM

## 2023-01-19 MED ORDER — ETHOSUXIMIDE 250 MG/5ML PO SOLN
ORAL | 1 refills | Status: DC
Start: 2023-01-19 — End: 2023-01-28

## 2023-01-19 NOTE — Telephone Encounter (Signed)
Pt is requesting a refill for ethosuximide (ZARONTIN) 250 MG/5ML solution .  Pharmacy: [phone]

## 2023-01-27 ENCOUNTER — Other Ambulatory Visit: Payer: Self-pay | Admitting: Neurology

## 2023-01-27 DIAGNOSIS — R4789 Other speech disturbances: Secondary | ICD-10-CM

## 2023-01-27 DIAGNOSIS — G40209 Localization-related (focal) (partial) symptomatic epilepsy and epileptic syndromes with complex partial seizures, not intractable, without status epilepticus: Secondary | ICD-10-CM

## 2023-01-27 DIAGNOSIS — R270 Ataxia, unspecified: Secondary | ICD-10-CM

## 2023-02-02 ENCOUNTER — Ambulatory Visit (INDEPENDENT_AMBULATORY_CARE_PROVIDER_SITE_OTHER): Payer: Medicare Other

## 2023-02-02 DIAGNOSIS — I4892 Unspecified atrial flutter: Secondary | ICD-10-CM | POA: Diagnosis not present

## 2023-02-02 LAB — CUP PACEART REMOTE DEVICE CHECK
Date Time Interrogation Session: 20240928215409
Implantable Pulse Generator Implant Date: 20240619

## 2023-02-12 ENCOUNTER — Encounter: Payer: Self-pay | Admitting: Neurology

## 2023-02-13 ENCOUNTER — Encounter: Payer: Self-pay | Admitting: *Deleted

## 2023-02-16 ENCOUNTER — Other Ambulatory Visit: Payer: Self-pay

## 2023-02-16 ENCOUNTER — Other Ambulatory Visit: Payer: Self-pay | Admitting: Neurology

## 2023-02-16 DIAGNOSIS — G40209 Localization-related (focal) (partial) symptomatic epilepsy and epileptic syndromes with complex partial seizures, not intractable, without status epilepticus: Secondary | ICD-10-CM

## 2023-02-16 DIAGNOSIS — R4789 Other speech disturbances: Secondary | ICD-10-CM

## 2023-02-16 DIAGNOSIS — R270 Ataxia, unspecified: Secondary | ICD-10-CM

## 2023-02-16 NOTE — Progress Notes (Signed)
Carelink Summary Report / Loop Recorder 

## 2023-02-17 ENCOUNTER — Other Ambulatory Visit: Payer: Self-pay | Admitting: Neurology

## 2023-02-17 DIAGNOSIS — R4789 Other speech disturbances: Secondary | ICD-10-CM

## 2023-02-17 DIAGNOSIS — R270 Ataxia, unspecified: Secondary | ICD-10-CM

## 2023-02-17 DIAGNOSIS — G40209 Localization-related (focal) (partial) symptomatic epilepsy and epileptic syndromes with complex partial seizures, not intractable, without status epilepticus: Secondary | ICD-10-CM

## 2023-02-17 MED ORDER — ETHOSUXIMIDE 250 MG/5ML PO SOLN: ORAL | 1 refills | Status: DC

## 2023-02-17 NOTE — Telephone Encounter (Signed)
Pt's mother called stating that the pt is needing a refill on her ethosuximide (ZARONTIN) 250 MG/5ML solution and she is about out. [facility] [address]

## 2023-02-20 ENCOUNTER — Encounter: Payer: Self-pay | Admitting: *Deleted

## 2023-02-23 ENCOUNTER — Ambulatory Visit: Payer: Medicare Other | Admitting: Anesthesiology

## 2023-02-23 ENCOUNTER — Encounter
Admission: RE | Disposition: A | Discharge status: Home / Self Care | Payer: Self-pay | Source: Home / Self Care | Attending: Gastroenterology

## 2023-02-23 ENCOUNTER — Other Ambulatory Visit: Payer: Self-pay

## 2023-02-23 ENCOUNTER — Ambulatory Visit
Admission: RE | Admit: 2023-02-23 | Discharge: 2023-02-23 | Disposition: A | Discharge status: Home / Self Care | Payer: Medicare Other | Attending: Gastroenterology | Admitting: Gastroenterology

## 2023-02-23 ENCOUNTER — Encounter: Payer: Self-pay | Admitting: *Deleted

## 2023-02-23 DIAGNOSIS — Z1211 Encounter for screening for malignant neoplasm of colon: Secondary | ICD-10-CM | POA: Insufficient documentation

## 2023-02-23 DIAGNOSIS — K589 Irritable bowel syndrome without diarrhea: Secondary | ICD-10-CM | POA: Diagnosis not present

## 2023-02-23 DIAGNOSIS — I2510 Atherosclerotic heart disease of native coronary artery without angina pectoris: Secondary | ICD-10-CM | POA: Insufficient documentation

## 2023-02-23 DIAGNOSIS — J4489 Other specified chronic obstructive pulmonary disease: Secondary | ICD-10-CM | POA: Insufficient documentation

## 2023-02-23 DIAGNOSIS — K6389 Other specified diseases of intestine: Secondary | ICD-10-CM | POA: Diagnosis not present

## 2023-02-23 DIAGNOSIS — Z794 Long term (current) use of insulin: Secondary | ICD-10-CM | POA: Insufficient documentation

## 2023-02-23 DIAGNOSIS — Z860100 Personal history of colon polyps, unspecified: Secondary | ICD-10-CM | POA: Insufficient documentation

## 2023-02-23 DIAGNOSIS — Z79890 Hormone replacement therapy (postmenopausal): Secondary | ICD-10-CM | POA: Diagnosis not present

## 2023-02-23 DIAGNOSIS — E1122 Type 2 diabetes mellitus with diabetic chronic kidney disease: Secondary | ICD-10-CM | POA: Diagnosis not present

## 2023-02-23 DIAGNOSIS — K640 First degree hemorrhoids: Secondary | ICD-10-CM | POA: Insufficient documentation

## 2023-02-23 DIAGNOSIS — I509 Heart failure, unspecified: Secondary | ICD-10-CM | POA: Diagnosis not present

## 2023-02-23 DIAGNOSIS — I132 Hypertensive heart and chronic kidney disease with heart failure and with stage 5 chronic kidney disease, or end stage renal disease: Secondary | ICD-10-CM | POA: Insufficient documentation

## 2023-02-23 DIAGNOSIS — K5730 Diverticulosis of large intestine without perforation or abscess without bleeding: Secondary | ICD-10-CM | POA: Insufficient documentation

## 2023-02-23 DIAGNOSIS — N186 End stage renal disease: Secondary | ICD-10-CM | POA: Insufficient documentation

## 2023-02-23 DIAGNOSIS — K3189 Other diseases of stomach and duodenum: Secondary | ICD-10-CM | POA: Diagnosis not present

## 2023-02-23 DIAGNOSIS — Z992 Dependence on renal dialysis: Secondary | ICD-10-CM | POA: Insufficient documentation

## 2023-02-23 DIAGNOSIS — K219 Gastro-esophageal reflux disease without esophagitis: Secondary | ICD-10-CM | POA: Diagnosis not present

## 2023-02-23 DIAGNOSIS — Z7951 Long term (current) use of inhaled steroids: Secondary | ICD-10-CM | POA: Insufficient documentation

## 2023-02-23 DIAGNOSIS — N3281 Overactive bladder: Secondary | ICD-10-CM | POA: Diagnosis not present

## 2023-02-23 DIAGNOSIS — G4730 Sleep apnea, unspecified: Secondary | ICD-10-CM | POA: Diagnosis not present

## 2023-02-23 HISTORY — PX: BIOPSY: SHX5522

## 2023-02-23 HISTORY — PX: ESOPHAGEAL BRUSHING: SHX6842

## 2023-02-23 HISTORY — PX: ESOPHAGOGASTRODUODENOSCOPY (EGD) WITH PROPOFOL: SHX5813

## 2023-02-23 HISTORY — PX: COLONOSCOPY WITH PROPOFOL: SHX5780

## 2023-02-23 HISTORY — DX: Hypothyroidism, unspecified: E03.9

## 2023-02-23 LAB — KOH PREP

## 2023-02-23 LAB — POCT I-STAT, CHEM 8
BUN: 62 mg/dL — ABNORMAL HIGH (ref 8–23)
Calcium, Ion: 1.08 mmol/L — ABNORMAL LOW (ref 1.15–1.40)
Chloride: 94 mmol/L — ABNORMAL LOW (ref 98–111)
Creatinine, Ser: 7.6 mg/dL — ABNORMAL HIGH (ref 0.44–1.00)
Glucose, Bld: 199 mg/dL — ABNORMAL HIGH (ref 70–99)
HCT: 28 % — ABNORMAL LOW (ref 36.0–46.0)
Hemoglobin: 9.5 g/dL — ABNORMAL LOW (ref 12.0–15.0)
Potassium: 4.9 mmol/L (ref 3.5–5.1)
Sodium: 128 mmol/L — ABNORMAL LOW (ref 135–145)
TCO2: 23 mmol/L (ref 22–32)

## 2023-02-23 SURGERY — COLONOSCOPY WITH PROPOFOL
Anesthesia: General

## 2023-02-23 MED ORDER — SODIUM CHLORIDE 0.9 % IV SOLN: INTRAVENOUS | Status: DC

## 2023-02-23 MED ORDER — PROPOFOL 10 MG/ML IV BOLUS
INTRAVENOUS | Status: DC | PRN
Start: 2023-02-23 — End: 2023-02-23
  Administered 2023-02-23: 100 mg via INTRAVENOUS
  Administered 2023-02-23: 150 ug/kg/min via INTRAVENOUS

## 2023-02-23 MED ORDER — LIDOCAINE HCL (PF) 2 % IJ SOLN
INTRAMUSCULAR | Status: DC | PRN
Start: 2023-02-23 — End: 2023-02-23
  Administered 2023-02-23 (×2): 100 mg via INTRADERMAL

## 2023-02-23 NOTE — OR Nursing (Signed)
I-stat K+ 4.9

## 2023-02-23 NOTE — Transfer of Care (Signed)
Immediate Anesthesia Transfer of Care Note  Patient: LANGLEY VANDERMEULEN  Procedure(s) Performed: COLONOSCOPY WITH PROPOFOL ESOPHAGOGASTRODUODENOSCOPY (EGD) WITH PROPOFOL  Patient Location: PACU  Anesthesia Type:General  Level of Consciousness: drowsy  Airway & Oxygen Therapy: Patient Spontanous Breathing  Post-op Assessment: Report given to RN and Post -op Vital signs reviewed and stable  Post vital signs: stable  Last Vitals:  Vitals Value Taken Time  BP 111/48 02/23/23 0850  Temp 36.4 C 02/23/23 0849  Pulse 57 02/23/23 0850  Resp 14 02/23/23 0849  SpO2 96 % 02/23/23 0850  Vitals shown include unfiled device data.  Last Pain:  Vitals:   02/23/23 0849  TempSrc: Temporal  PainSc: Asleep         Complications: No notable events documented.

## 2023-02-23 NOTE — Anesthesia Postprocedure Evaluation (Signed)
Anesthesia Post Note  Patient: [facility]  Procedure(s) Performed: COLONOSCOPY WITH PROPOFOL ESOPHAGOGASTRODUODENOSCOPY (EGD) WITH PROPOFOL  Patient location during evaluation: Endoscopy Anesthesia Type: General Level of consciousness: awake and alert Pain management: pain level controlled Vital Signs Assessment: post-procedure vital signs reviewed and stable Respiratory status: spontaneous breathing, nonlabored ventilation, respiratory function stable and patient connected to nasal cannula oxygen Cardiovascular status: blood pressure returned to baseline and stable Postop Assessment: no apparent nausea or vomiting Anesthetic complications: no   There were no known notable events for this encounter.   Last Vitals:  Vitals:   02/23/23 0859 02/23/23 0909  BP: (!) 125/49 (!) 141/56  Pulse: (!) 57 (!) 57  Resp:    Temp: (!) 36.4 C (!) 36.4 C  SpO2: 96% 97%    Last Pain:  Vitals:   02/23/23 0909  TempSrc: Temporal  PainSc: 0-No pain                 Cleda Mccreedy Mechelle Pates

## 2023-02-23 NOTE — Interval H&P Note (Signed)
History and Physical Interval Note:  02/23/2023 8:13 AM  Nina Johnston  has presented today for surgery, with the diagnosis of h/o ta polyps abnormal ct of liver.  The various methods of treatment have been discussed with the patient and family. After consideration of risks, benefits and other options for treatment, the patient has consented to  Procedure(s) with comments: COLONOSCOPY WITH PROPOFOL (N/A) - DM & DIALYSIS PATIENT ESOPHAGOGASTRODUODENOSCOPY (EGD) WITH PROPOFOL (N/A) as a surgical intervention.  The patient's history has been reviewed, patient examined, no change in status, stable for surgery.  I have reviewed the patient's chart and labs.  Questions were answered to the patient's satisfaction.     Regis Bill  Ok to proceed with EGD/Colonoscopy

## 2023-02-23 NOTE — Anesthesia Preprocedure Evaluation (Signed)
Anesthesia Evaluation  Patient identified by MRN, date of birth, ID band Patient awake    Reviewed: Allergy & Precautions, NPO status , Patient's Chart, lab work & pertinent test results  History of Anesthesia Complications Negative for: history of anesthetic complications  Airway Mallampati: III  TM Distance: <3 FB Neck ROM: full    Dental  (+) Chipped   Pulmonary asthma , sleep apnea , COPD   Pulmonary exam normal        Cardiovascular hypertension, (-) angina + CAD and +CHF  Normal cardiovascular exam     Neuro/Psych Seizures -,  PSYCHIATRIC DISORDERS         GI/Hepatic Neg liver ROS,GERD  ,,  Endo/Other  diabetes, Type 2Hypothyroidism    Renal/GU DialysisRenal disease  negative genitourinary   Musculoskeletal   Abdominal   Peds  Hematology negative hematology ROS (+)   Anesthesia Other Findings Past Medical History: No date: A-V fistula (HCC) 04/19/2014: Acute bacterial sinusitis No date: Adaptive colitis No date: Anginal pain (HCC) No date: Anxiety No date: Anxiety and depression No date: Arthritis No date: Asthma 11/10/2012: Ataxia     Comment:   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  On multiple               psychotropic medications, and with DM>  12/26/2014: Benign essential HTN No date: Bipolar disorder (HCC) No date: Bronchitis 12/22/2014: Carotid artery narrowing No date: Cervical prolapse No date: CHF (congestive heart failure) (HCC) No date: CKD (chronic kidney disease) 01/14/2014: Concussion     Comment:  Fall  No date: COPD (chronic obstructive pulmonary disease) (HCC) No date: Coronary artery disease No date: Depression No date: Diabetes mellitus without complication (HCC) No date: Dialysis patient (HCC) No date: Diplopia No date: Dupuytren's contracture of foot 02/12/2015: Essential (primary) hypertension No date: Family history of thyroid problem No date: GERD (gastroesophageal  reflux disease) No date: Gout No date: Heart disease     Comment:  enlarged because of COPD No date: Heart murmur No date: Hepatomegaly No date: HLD (hyperlipidemia) No date: Hyperactive airway disease No date: Hyperkalemia No date: Hypertension No date: Hypothyroidism No date: Irritable colon No date: Morbid obesity (HCC) No date: Multiple thyroid nodules No date: OAB (overactive bladder) No date: Pernicious anemia No date: Post menopausal syndrome 03/08/2014: Post-concussion syndrome No date: Post-traumatic brain syndrome No date: Prolapsed uterus No date: PSVT (paroxysmal supraventricular tachycardia) (HCC) No date: Renal calculus No date: Renal disease     Comment:  w/ GFR 29-may be due to diabetes No date: Seizures (HCC)     Comment:  last seizure 2012. has had spells since with no               knowledge. 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 11/10/2012: Transient alteration of awareness 03/08/2014: Urinary incontinence with continuous leakage  Past Surgical History: 07/21/2016: A/V FISTULAGRAM; Left     Comment:  Procedure: A/V Fistulagram;  Surgeon: Annice Needy, MD;                Location: [facility];  Service: Cardiovascular;              Laterality: Left; 12/24/2016: A/V FISTULAGRAM; Left     Comment:  Procedure: A/V Fistulagram;  Surgeon: Annice Needy, MD;               Location: [facility];  Service: Cardiovascular;              Laterality: Left; 06/29/2017: A/V FISTULAGRAM; Left     Comment:  Procedure: A/V FISTULAGRAM;  Surgeon: Annice Needy, MD;               Location: [facility];  Service: Cardiovascular;              Laterality: Left; 03/08/2018: A/V FISTULAGRAM; Left     Comment:  Procedure: A/V FISTULAGRAM;  Surgeon: Annice Needy, MD;               Location: [facility];  Service: Cardiovascular;              Laterality:  Left; 04/11/2019: A/V FISTULAGRAM; Left     Comment:  Procedure: A/V FISTULAGRAM;  Surgeon: Annice Needy, MD;               Location: [facility];  Service: Cardiovascular;              Laterality: Left; 07/21/2016: A/V SHUNT INTERVENTION; N/A     Comment:  Procedure: A/V Shunt Intervention;  Surgeon: Annice Needy, MD;  Location: [facility];  Service:               Cardiovascular;  Laterality: N/A; 12/24/2016: A/V SHUNT INTERVENTION; N/A     Comment:  Procedure: A/V SHUNT INTERVENTION;  Surgeon: Annice Needy, MD;  Location: [facility];  Service:               Cardiovascular;  Laterality: N/A; No date: AVF 06/12/2022: BREAST BIOPSY; Left     Comment:  LEFT stereo bx, calcs, "X" clip-path pending 06/12/2022: BREAST BIOPSY; Left     Comment:  MM LT BREAST BX W LOC DEV 1ST LESION IMAGE BX SPEC               STEREO GUIDE 06/12/2022 [facility] No date: CHOLECYSTECTOMY No date: COLONOSCOPY 03/18/2017: COLONOSCOPY WITH PROPOFOL; N/A     Comment:  Procedure: COLONOSCOPY WITH PROPOFOL;  Surgeon: Scot Jun, MD;  Location: [facility] ENDOSCOPY;  Service:               Endoscopy;  Laterality: N/A; No date: ENDOMETRIAL BIOPSY     Comment:  ablation, uterine 03/18/2017: ESOPHAGOGASTRODUODENOSCOPY (EGD) WITH PROPOFOL; N/A     Comment:  Procedure: ESOPHAGOGASTRODUODENOSCOPY (EGD) WITH               PROPOFOL;  Surgeon: Scot Jun, MD;  Location:               [facility] ENDOSCOPY;  Service: Endoscopy;  Laterality: N/A; No date: HERNIA REPAIR No date: HERNIA REPAIR 08/23/2015: PERIPHERAL VASCULAR CATHETERIZATION; N/A     Comment:  Procedure: Dialysis/Perma Catheter Insertion;  Surgeon:               Annice Needy, MD;  Location: [facility];                Service: Cardiovascular;  Laterality: N/A; 11/09/2015: PERIPHERAL VASCULAR CATHETERIZATION; N/A     Comment:  Procedure: Dialysis/Perma Catheter Removal;  Surgeon:  Renford Dills, MD;  Location: [facility];                Service: Cardiovascular;  Laterality: N/A; 04/23/2016: PERIPHERAL VASCULAR CATHETERIZATION; Left     Comment:  Procedure: A/V Fistulagram;  Surgeon: Annice Needy, MD;                Location: [facility];  Service: Cardiovascular;              Laterality: Left; No date: PORT A CATH REVISION     Reproductive/Obstetrics negative OB ROS                             Anesthesia Physical Anesthesia Plan  ASA: 4  Anesthesia Plan: General   Post-op Pain Management:    Induction: Intravenous  PONV Risk Score and Plan: Propofol infusion and TIVA  Airway Management Planned: Natural Airway and Nasal Cannula  Additional Equipment:   Intra-op Plan:   Post-operative Plan:   Informed Consent: I have reviewed the patients History and Physical, chart, labs and discussed the procedure including the risks, benefits and alternatives for the proposed anesthesia with the patient or authorized representative who has indicated his/her understanding and acceptance.     Dental Advisory Given  Plan Discussed with: Anesthesiologist, CRNA and Surgeon  Anesthesia Plan Comments: (Patient consented for risks of anesthesia including but not limited to:  - adverse reactions to medications - risk of airway placement if required - damage to eyes, teeth, lips or other oral mucosa - nerve damage due to positioning  - sore throat or hoarseness - Damage to heart, brain, nerves, lungs, other parts of body or loss of life  Patient voiced understanding and assent.)       Anesthesia Quick Evaluation

## 2023-02-23 NOTE — H&P (Signed)
Outpatient short stay form Pre-procedure 02/23/2023  Regis Bill, MD  Primary Physician: Marguarite Arbour, MD  Reason for visit:  History of polyps/Possible cirrhosis  History of present illness:    62 y/o lady with multiple medical problems including ESRD on hemodialysis, obesity, DM II, COPD, and hypertension here for EGD for possible cirrhosis seen on imaging and colonoscopy for history of adenomatous polyp on last colonoscopy in 2019. No blood thinners. No family history of GI malignancies. History of hernia repair and cholecystectomy.    Current Facility-Administered Medications:    0.9 %  sodium chloride infusion, , Intravenous, Continuous, Lafreda Casebeer, Rossie Muskrat, MD  Medications Prior to Admission  Medication Sig Dispense Refill Last Dose   cloNIDine (CATAPRES - DOSED IN MG/24 HR) 0.1 mg/24hr patch Place 0.1 mg onto the skin once a week.   02/23/2023   ethosuximide (ZARONTIN) 250 MG/5ML solution TAKE 7. 5 ML BY MOUTH TWICE A DAY take after HD treatment on dialysis day. 750 mL 1 02/23/2023   insulin glargine (LANTUS) 100 UNIT/ML injection Inject 22 Units into the skin at bedtime. 16 units on Monday,Wednesday,Friday, 12 units on dialysis days   02/22/2023   insulin lispro (HUMALOG) 100 UNIT/ML injection Inject 8-15 Units into the skin 3 (three) times daily with meals. Per sliding scale   02/22/2023   LAMICTAL 200 MG tablet Take 1.5 tablets (300 mg total) by mouth 2 (two) times daily. GLAXO BRAND NAME 270 tablet 1 02/23/2023 at 0600   levETIRAcetam (KEPPRA) 250 MG tablet Take Bid on non -dialysis days, 3 a day on hemodialysis days. 270 tablet 5 02/22/2023   levothyroxine (SYNTHROID) 150 MCG tablet Take 150 mcg by mouth daily before breakfast.    02/22/2023   metoprolol succinate (TOPROL XL) 100 MG 24 hr tablet Take 1 tablet (100 mg total) by mouth in the morning and at bedtime. Take with or immediately following a meal. 180 tablet 3 02/23/2023 at 0600   montelukast (SINGULAIR) 10  MG tablet SMARTSIG:1 Tablet(s) By Mouth Every Evening   02/22/2023   pantoprazole (PROTONIX) 40 MG tablet Take 40 mg by mouth every morning.    02/22/2023   valsartan (DIOVAN) 80 MG tablet Take 1 tablet (80 mg total) by mouth daily. 180 tablet 3 02/23/2023 at 0600   AURYXIA 1 GM 210 MG(Fe) tablet Take 630 mg by mouth 3 (three) times daily.   02/21/2023   b complex vitamins tablet Take 1 tablet by mouth at bedtime.    02/19/2023   B Complex-C-Folic Acid (DIALYVITE TABLET) TABS Take 1 tablet by mouth daily.  12 02/19/2023   chlorpheniramine-HYDROcodone (TUSSIONEX) 10-8 MG/5ML TAKE 5 ML BY MOUTH EVERY 12 HOURS AS NEEDED FOR COUGH      clonazePAM (KLONOPIN) 1 MG tablet TAKE 1-2 TABLETS (1-2 MG TOTAL) BY MOUTH DAILY. 90 tablet 3 02/19/2023   Continuous Blood Gluc Sensor (DEXCOM G6 SENSOR) MISC Use 1 every 10 days      Continuous Blood Gluc Transmit (DEXCOM G6 TRANSMITTER) MISC USE EVERY 3 MONTHS      famotidine (PEPCID) 40 MG tablet SMARTSIG:1 Tablet(s) By Mouth Every Evening   02/21/2023   Febuxostat 80 MG TABS Take 80 mg by mouth daily.   02/21/2023   fexofenadine (ALLEGRA) 180 MG tablet Take 180 mg by mouth daily.      Fluticasone-Salmeterol (ADVAIR) 250-50 MCG/DOSE AEPB Inhale 1 puff into the lungs 2 (two) times daily. (Patient not taking: Reported on 02/23/2023)   Not Taking   furosemide (LASIX) 20  MG tablet Take 30 mg by mouth 2 (two) times daily.    02/21/2023   Glucagon, rDNA, (GLUCAGON EMERGENCY) 1 MG KIT INJECT 1 MG INTO THE MUSCLE AS DIRECTED      lidocaine-prilocaine (EMLA) cream Apply topically as directed.      Methoxy PEG-Epoetin Beta (MIRCERA IJ) Inject as directed. Receives w/ dialysis, unsure how often right now      nystatin-triamcinolone (MYCOLOG II) cream Apply 1 application topically 2 (two) times daily. 30 g 1    Probiotic Product (ALIGN) 4 MG CAPS Take 4 mg by mouth daily.       sevelamer carbonate (RENVELA) 800 MG tablet Take 3,200 mg by mouth daily.   02/16/2023   UNIFINE  PENTIPS 32G X 4 MM MISC USE UP TO 5 TIMES DAILY AS DIRECTED  3      Allergies  Allergen Reactions   Cinnamon Other (See Comments)    Raises blood pressure   Procrit [Epoetin Alfa-Epbx] Other (See Comments)    Seizures, grand mal   Ace Inhibitors Cough   Azithromycin Other (See Comments)    seizures   Iron Other (See Comments)    Can not take in IV Form - causes seizures    Neomycin-Bacitracin Zn-Polymyx Other (See Comments)    Unknown   Other    Sulfa Antibiotics Other (See Comments)    Due to risks for seizures.     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  On multiple psychotropic medications, and with DM>    Benign essential HTN 12/26/2014   Bipolar disorder (HCC)    Bronchitis    Carotid artery narrowing 12/22/2014   Cervical prolapse    CHF (congestive heart failure) (HCC)    CKD (chronic kidney disease)    Concussion 01/14/2014   Fall    COPD (chronic obstructive pulmonary disease) (HCC)    Coronary artery disease    Depression    Diabetes mellitus without complication (HCC)    Dialysis patient (HCC)    Diplopia    Dupuytren's contracture of foot    Essential (primary) hypertension 02/12/2015   Family history of thyroid problem    GERD (gastroesophageal reflux disease)    Gout    Heart disease    enlarged because of COPD   Heart murmur    Hepatomegaly    HLD (hyperlipidemia)    Hyperactive airway disease    Hyperkalemia    Hypertension    Hypothyroidism    Irritable colon    Morbid obesity (HCC)    Multiple thyroid nodules    OAB (overactive bladder)    Pernicious anemia    Post menopausal syndrome    Post-concussion syndrome 03/08/2014   Post-traumatic brain syndrome    Prolapsed uterus    PSVT (paroxysmal supraventricular  tachycardia) (HCC)    Renal calculus    Renal disease    w/ GFR 29-may be due to diabetes   Seizures (HCC)    last seizure 2012. has had spells since with no knowledge. 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    Physical Exam  Gen: Alert, oriented. Appears stated age.  HEENT: PERRLA. Lungs: No respiratory distress CV: RRR Abd: soft, benign, no masses Ext: No edema    Planned procedures: Proceed with EGD/colonoscopy. The patient understands the nature of the planned procedure, indications, risks, alternatives and potential complications including but not limited to bleeding, infection, perforation, damage to internal organs and possible oversedation/side effects from anesthesia. The patient agrees and gives consent to proceed.  Please refer to procedure notes for findings, recommendations and patient disposition/instructions.     Regis Bill, MD [facility]

## 2023-02-23 NOTE — Op Note (Signed)
[facility] Patient Name: Nina Johnston Procedure Date: 02/23/2023 8:17 AM MRN: [id number] Account #: [id number] Date of Birth: [birth year] Admit Type: Outpatient Age: 62 Room: [facility] ENDO ROOM 3 Gender: Female Note Status: Finalized Instrument Name: Patton Salles Endoscope [id number] Procedure:             Upper GI endoscopy Indications:           Abnormal CT of the GI tract Providers:             Eather Colas MD, MD Referring MD:          Duane Lope. Judithann Sheen, MD (Referring MD) Medicines:             Monitored Anesthesia Care Complications:         No immediate complications. Estimated blood loss:                         Minimal. Procedure:             Pre-Anesthesia Assessment:                        - Prior to the procedure, a History and Physical was                         performed, and patient medications and allergies were                         reviewed. The patient is competent. The risks and                         benefits of the procedure and the sedation options and                         risks were discussed with the patient. All questions                         were answered and informed consent was obtained.                         Patient identification and proposed procedure were                         verified by the physician, the nurse, the                         anesthesiologist, the anesthetist and the technician                         in the endoscopy suite. Mental Status Examination:                         alert and oriented. Airway Examination: normal                         oropharyngeal airway and neck mobility. Respiratory                         Examination: clear to auscultation. CV Examination:  normal. Prophylactic Antibiotics: The patient does not                         require prophylactic antibiotics. Prior                         Anticoagulants: The patient has taken no anticoagulant                          or antiplatelet agents. ASA Grade Assessment: III - A                         patient with severe systemic disease. After reviewing                         the risks and benefits, the patient was deemed in                         satisfactory condition to undergo the procedure. The                         anesthesia plan was to use monitored anesthesia care                         (MAC). Immediately prior to administration of                         medications, the patient was re-assessed for adequacy                         to receive sedatives. The heart rate, respiratory                         rate, oxygen saturations, blood pressure, adequacy of                         pulmonary ventilation, and response to care were                         monitored throughout the procedure. The physical                         status of the patient was re-assessed after the                         procedure.                        After obtaining informed consent, the endoscope was                         passed under direct vision. Throughout the procedure,                         the patient's blood pressure, pulse, and oxygen                         saturations were monitored continuously. The Endoscope  was introduced through the mouth, and advanced to the                         second part of duodenum. The upper GI endoscopy was                         accomplished without difficulty. The patient tolerated                         the procedure well. Findings:      The examined esophagus was normal.      A single 15 mm papule (nodule) with no bleeding and no stigmata of       recent bleeding was found in the gastric body. Biopsies were taken with       a cold forceps for histology. Estimated blood loss was minimal.      The exam of the stomach was otherwise normal.      The examined duodenum was normal. Impression:            - Normal esophagus.                         - A single papule (nodule) found in the stomach.                         Biopsied.                        - Normal examined duodenum. Recommendation:        - Discharge patient to home.                        - Resume previous diet.                        - Continue present medications.                        - Await pathology results.                        - Return to referring physician as previously                         scheduled. Procedure Code(s):     --- Professional ---                        [phone], Esophagogastroduodenoscopy, flexible,                         transoral; with biopsy, single or multiple Diagnosis Code(s):     --- Professional ---                        K31.89, Other diseases of stomach and duodenum                        R93.3, Abnormal findings on diagnostic imaging of                         other parts of digestive tract CPT copyright 2022  American Medical Association. All rights reserved. The codes documented in this report are preliminary and upon coder review may  be revised to meet current compliance requirements. Eather Colas MD, MD 02/23/2023 8:50:46 AM Number of Addenda: 0 Note Initiated On: 02/23/2023 8:17 AM Estimated Blood Loss:  Estimated blood loss was minimal.      [facility]

## 2023-02-23 NOTE — Op Note (Signed)
[facility] Patient Name: Nina Johnston Procedure Date: 02/23/2023 8:16 AM MRN: [id number] Account #: [id number] Date of Birth: May 24, 1960 Admit Type: Outpatient Age: 62 Room: [facility] ENDO ROOM 3 Gender: Female Note Status: Finalized Instrument Name: Prentice Docker [id number] Procedure:             Colonoscopy Indications:           Surveillance: Personal history of adenomatous polyps                         on last colonoscopy 5 years ago Providers:             Eather Colas MD, MD Referring MD:          Duane Lope. Judithann Sheen, MD (Referring MD) Medicines:             Monitored Anesthesia Care Complications:         No immediate complications. Procedure:             Pre-Anesthesia Assessment:                        - Prior to the procedure, a History and Physical was                         performed, and patient medications and allergies were                         reviewed. The patient is competent. The risks and                         benefits of the procedure and the sedation options and                         risks were discussed with the patient. All questions                         were answered and informed consent was obtained.                         Patient identification and proposed procedure were                         verified by the physician, the nurse, the                         anesthesiologist, the anesthetist and the technician                         in the endoscopy suite. Mental Status Examination:                         alert and oriented. Airway Examination: normal                         oropharyngeal airway and neck mobility. Respiratory                         Examination: clear to auscultation. CV Examination:  normal. Prophylactic Antibiotics: The patient does not                         require prophylactic antibiotics. Prior                         Anticoagulants: The patient has taken no  anticoagulant                         or antiplatelet agents. ASA Grade Assessment: III - A                         patient with severe systemic disease. After reviewing                         the risks and benefits, the patient was deemed in                         satisfactory condition to undergo the procedure. The                         anesthesia plan was to use monitored anesthesia care                         (MAC). Immediately prior to administration of                         medications, the patient was re-assessed for adequacy                         to receive sedatives. The heart rate, respiratory                         rate, oxygen saturations, blood pressure, adequacy of                         pulmonary ventilation, and response to care were                         monitored throughout the procedure. The physical                         status of the patient was re-assessed after the                         procedure.                        After obtaining informed consent, the colonoscope was                         passed under direct vision. Throughout the procedure,                         the patient's blood pressure, pulse, and oxygen                         saturations were monitored continuously. The  Colonoscope was introduced through the anus and                         advanced to the the cecum, identified by appendiceal                         orifice and ileocecal valve. The colonoscopy was                         performed without difficulty. The patient tolerated                         the procedure well. The quality of the bowel                         preparation was inadequate. The ileocecal valve,                         appendiceal orifice, and rectum were photographed. Findings:      The perianal and digital rectal examinations were normal.      Scattered small-mouthed diverticula were found in the sigmoid colon,        descending colon and ascending colon.      A diffuse area of mild melanosis was found in the entire colon.      Internal hemorrhoids were found during retroflexion. The hemorrhoids       were Grade I (internal hemorrhoids that do not prolapse).      The exam was otherwise without abnormality on direct and retroflexion       views. Impression:            - Preparation of the colon was inadequate.                        - Diverticulosis in the sigmoid colon, in the                         descending colon and in the ascending colon.                        - Melanosis in the colon.                        - Internal hemorrhoids.                        - The examination was otherwise normal on direct and                         retroflexion views.                        - No specimens collected. Recommendation:        - Discharge patient to home.                        - Resume previous diet.                        - Continue present medications.                        -  Repeat colonoscopy in 1 year because the bowel                         preparation was suboptimal.                        - Return to referring physician as previously                         scheduled. Procedure Code(s):     --- Professional ---                        W0981, Colorectal cancer screening; colonoscopy on                         individual at high risk Diagnosis Code(s):     --- Professional ---                        Z86.010, Personal history of colonic polyps                        K64.0, First degree hemorrhoids                        K63.89, Other specified diseases of intestine                        K57.30, Diverticulosis of large intestine without                         perforation or abscess without bleeding CPT copyright 2022 American Medical Association. All rights reserved. The codes documented in this report are preliminary and upon coder review may  be revised to meet current compliance  requirements. Eather Colas MD, MD 02/23/2023 8:54:50 AM Number of Addenda: 0 Note Initiated On: 02/23/2023 8:16 AM Scope Withdrawal Time: 0 hours 3 minutes 51 seconds  Total Procedure Duration: 0 hours 10 minutes 50 seconds  Estimated Blood Loss:  Estimated blood loss: none.      [facility]

## 2023-02-24 ENCOUNTER — Other Ambulatory Visit: Payer: Self-pay | Admitting: Neurology

## 2023-02-24 ENCOUNTER — Encounter: Payer: Self-pay | Admitting: Gastroenterology

## 2023-02-24 DIAGNOSIS — R270 Ataxia, unspecified: Secondary | ICD-10-CM

## 2023-02-24 DIAGNOSIS — G40209 Localization-related (focal) (partial) symptomatic epilepsy and epileptic syndromes with complex partial seizures, not intractable, without status epilepticus: Secondary | ICD-10-CM

## 2023-02-24 DIAGNOSIS — R4789 Other speech disturbances: Secondary | ICD-10-CM

## 2023-03-03 LAB — SURGICAL PATHOLOGY

## 2023-03-06 LAB — CUP PACEART REMOTE DEVICE CHECK
Date Time Interrogation Session: 20241031215406
Implantable Pulse Generator Implant Date: 20240619

## 2023-03-09 ENCOUNTER — Ambulatory Visit (INDEPENDENT_AMBULATORY_CARE_PROVIDER_SITE_OTHER): Payer: Medicare Other

## 2023-03-09 DIAGNOSIS — I4892 Unspecified atrial flutter: Secondary | ICD-10-CM | POA: Diagnosis not present

## 2023-03-26 ENCOUNTER — Other Ambulatory Visit: Payer: Self-pay | Admitting: Neurology

## 2023-03-31 NOTE — Progress Notes (Signed)
Carelink Summary Report / Loop Recorder 

## 2023-04-13 ENCOUNTER — Ambulatory Visit (INDEPENDENT_AMBULATORY_CARE_PROVIDER_SITE_OTHER): Payer: Medicare Other

## 2023-04-13 DIAGNOSIS — I4892 Unspecified atrial flutter: Secondary | ICD-10-CM

## 2023-04-13 LAB — CUP PACEART REMOTE DEVICE CHECK
Date Time Interrogation Session: 20241208231806
Implantable Pulse Generator Implant Date: 20240619

## 2023-05-18 ENCOUNTER — Ambulatory Visit (INDEPENDENT_AMBULATORY_CARE_PROVIDER_SITE_OTHER): Payer: Medicare Other

## 2023-05-18 DIAGNOSIS — I4892 Unspecified atrial flutter: Secondary | ICD-10-CM

## 2023-05-18 LAB — CUP PACEART REMOTE DEVICE CHECK
Date Time Interrogation Session: 20250112232328
Implantable Pulse Generator Implant Date: 20240619

## 2023-05-20 ENCOUNTER — Telehealth: Payer: Self-pay

## 2023-05-20 NOTE — Telephone Encounter (Signed)
 I spoke with the pt and let her know the monitor is fine.

## 2023-05-20 NOTE — Telephone Encounter (Signed)
 I asked the pt mother to have the pt to call before 4:45 pm so I can help her with the monitor lights.

## 2023-05-21 NOTE — Progress Notes (Signed)
Carelink Summary Report / Loop Recorder 

## 2023-05-22 ENCOUNTER — Other Ambulatory Visit: Payer: Self-pay | Admitting: Neurology

## 2023-05-25 ENCOUNTER — Observation Stay: Payer: Medicare Other

## 2023-05-25 ENCOUNTER — Emergency Department: Payer: Medicare Other

## 2023-05-25 ENCOUNTER — Observation Stay
Admission: EM | Admit: 2023-05-25 | Discharge: 2023-05-26 | Disposition: A | Discharge status: Home / Self Care | Payer: Medicare Other | Attending: Internal Medicine | Admitting: Internal Medicine

## 2023-05-25 ENCOUNTER — Other Ambulatory Visit: Payer: Self-pay

## 2023-05-25 DIAGNOSIS — J449 Chronic obstructive pulmonary disease, unspecified: Secondary | ICD-10-CM | POA: Diagnosis present

## 2023-05-25 DIAGNOSIS — I132 Hypertensive heart and chronic kidney disease with heart failure and with stage 5 chronic kidney disease, or end stage renal disease: Secondary | ICD-10-CM | POA: Diagnosis not present

## 2023-05-25 DIAGNOSIS — R079 Chest pain, unspecified: Secondary | ICD-10-CM | POA: Diagnosis present

## 2023-05-25 DIAGNOSIS — N186 End stage renal disease: Secondary | ICD-10-CM

## 2023-05-25 DIAGNOSIS — E041 Nontoxic single thyroid nodule: Secondary | ICD-10-CM | POA: Diagnosis not present

## 2023-05-25 DIAGNOSIS — Z79899 Other long term (current) drug therapy: Secondary | ICD-10-CM | POA: Insufficient documentation

## 2023-05-25 DIAGNOSIS — K7460 Unspecified cirrhosis of liver: Secondary | ICD-10-CM | POA: Insufficient documentation

## 2023-05-25 DIAGNOSIS — I5033 Acute on chronic diastolic (congestive) heart failure: Principal | ICD-10-CM | POA: Insufficient documentation

## 2023-05-25 DIAGNOSIS — E039 Hypothyroidism, unspecified: Secondary | ICD-10-CM | POA: Diagnosis not present

## 2023-05-25 DIAGNOSIS — I161 Hypertensive emergency: Principal | ICD-10-CM

## 2023-05-25 DIAGNOSIS — D631 Anemia in chronic kidney disease: Secondary | ICD-10-CM | POA: Diagnosis not present

## 2023-05-25 DIAGNOSIS — I2510 Atherosclerotic heart disease of native coronary artery without angina pectoris: Secondary | ICD-10-CM | POA: Insufficient documentation

## 2023-05-25 DIAGNOSIS — I272 Pulmonary hypertension, unspecified: Secondary | ICD-10-CM | POA: Insufficient documentation

## 2023-05-25 DIAGNOSIS — G40909 Epilepsy, unspecified, not intractable, without status epilepticus: Secondary | ICD-10-CM

## 2023-05-25 DIAGNOSIS — E119 Type 2 diabetes mellitus without complications: Secondary | ICD-10-CM

## 2023-05-25 DIAGNOSIS — I509 Heart failure, unspecified: Secondary | ICD-10-CM

## 2023-05-25 DIAGNOSIS — E1122 Type 2 diabetes mellitus with diabetic chronic kidney disease: Secondary | ICD-10-CM | POA: Insufficient documentation

## 2023-05-25 DIAGNOSIS — Z794 Long term (current) use of insulin: Secondary | ICD-10-CM | POA: Diagnosis not present

## 2023-05-25 DIAGNOSIS — R4789 Other speech disturbances: Secondary | ICD-10-CM | POA: Diagnosis present

## 2023-05-25 DIAGNOSIS — Z992 Dependence on renal dialysis: Secondary | ICD-10-CM | POA: Insufficient documentation

## 2023-05-25 DIAGNOSIS — R0789 Other chest pain: Secondary | ICD-10-CM | POA: Diagnosis present

## 2023-05-25 DIAGNOSIS — I5031 Acute diastolic (congestive) heart failure: Secondary | ICD-10-CM

## 2023-05-25 DIAGNOSIS — N185 Chronic kidney disease, stage 5: Secondary | ICD-10-CM | POA: Diagnosis present

## 2023-05-25 LAB — HEPATITIS B SURFACE ANTIGEN: Hepatitis B Surface Ag: NONREACTIVE

## 2023-05-25 LAB — CBC
HCT: 30.5 % — ABNORMAL LOW (ref 36.0–46.0)
Hemoglobin: 9.8 g/dL — ABNORMAL LOW (ref 12.0–15.0)
MCH: 35.9 pg — ABNORMAL HIGH (ref 26.0–34.0)
MCHC: 32.1 g/dL (ref 30.0–36.0)
MCV: 111.7 fL — ABNORMAL HIGH (ref 80.0–100.0)
Platelets: 104 10*3/uL — ABNORMAL LOW (ref 150–400)
RBC: 2.73 MIL/uL — ABNORMAL LOW (ref 3.87–5.11)
RDW: 14.5 % (ref 11.5–15.5)
WBC: 12.1 10*3/uL — ABNORMAL HIGH (ref 4.0–10.5)
nRBC: 0 % (ref 0.0–0.2)

## 2023-05-25 LAB — TROPONIN I (HIGH SENSITIVITY)
Troponin I (High Sensitivity): 13 ng/L (ref <18)
Troponin I (High Sensitivity): 16 ng/L (ref <18)

## 2023-05-25 LAB — CBG MONITORING, ED: Glucose-Capillary: 133 mg/dL — ABNORMAL HIGH (ref 70–99)

## 2023-05-25 LAB — COMPREHENSIVE METABOLIC PANEL
ALT: 18 U/L (ref 0–44)
AST: 17 U/L (ref 15–41)
Albumin: 3.8 g/dL (ref 3.5–5.0)
Alkaline Phosphatase: 136 U/L — ABNORMAL HIGH (ref 38–126)
Anion gap: 14 (ref 5–15)
BUN: 57 mg/dL — ABNORMAL HIGH (ref 8–23)
CO2: 28 mmol/L (ref 22–32)
Calcium: 8.8 mg/dL — ABNORMAL LOW (ref 8.9–10.3)
Chloride: 97 mmol/L — ABNORMAL LOW (ref 98–111)
Creatinine, Ser: 7.8 mg/dL — ABNORMAL HIGH (ref 0.44–1.00)
GFR, Estimated: 5 mL/min — ABNORMAL LOW (ref >60)
Glucose, Bld: 151 mg/dL — ABNORMAL HIGH (ref 70–99)
Potassium: 5.2 mmol/L — ABNORMAL HIGH (ref 3.5–5.1)
Sodium: 139 mmol/L (ref 135–145)
Total Bilirubin: 1 mg/dL (ref 0.0–1.2)
Total Protein: 6.4 g/dL — ABNORMAL LOW (ref 6.5–8.1)

## 2023-05-25 LAB — BRAIN NATRIURETIC PEPTIDE: B Natriuretic Peptide: 846.3 pg/mL — ABNORMAL HIGH (ref 0.0–100.0)

## 2023-05-25 LAB — LIPASE, BLOOD: Lipase: 37 U/L (ref 11–51)

## 2023-05-25 MED ORDER — LEVETIRACETAM 250 MG PO TABS
250.0000 mg | ORAL_TABLET | ORAL | Status: DC
Start: 2023-05-26 — End: 2023-05-26
  Administered 2023-05-26: 250 mg via ORAL
  Filled 2023-05-25 (×2): qty 1

## 2023-05-25 MED ORDER — FEBUXOSTAT 40 MG PO TABS
80.0000 mg | ORAL_TABLET | Freq: Every day | ORAL | Status: DC
  Administered 2023-05-26: 80 mg via ORAL
  Filled 2023-05-25: qty 2

## 2023-05-25 MED ORDER — IRBESARTAN 75 MG PO TABS
75.0000 mg | ORAL_TABLET | Freq: Every day | ORAL | Status: DC
  Administered 2023-05-25 – 2023-05-26 (×2): 75 mg via ORAL
  Filled 2023-05-25 (×2): qty 1

## 2023-05-25 MED ORDER — NITROGLYCERIN 2 % TD OINT
1.0000 [in_us] | TOPICAL_OINTMENT | TRANSDERMAL | Status: AC
  Administered 2023-05-25: 1 [in_us] via TOPICAL
  Filled 2023-05-25: qty 1

## 2023-05-25 MED ORDER — LAMOTRIGINE 100 MG PO TABS
300.0000 mg | ORAL_TABLET | Freq: Two times a day (BID) | ORAL | Status: DC
  Administered 2023-05-25 – 2023-05-26 (×2): 300 mg via ORAL
  Filled 2023-05-25 (×2): qty 3

## 2023-05-25 MED ORDER — FUROSEMIDE 10 MG/ML IJ SOLN
20.0000 mg | Freq: Two times a day (BID) | INTRAMUSCULAR | Status: DC
  Administered 2023-05-25 (×2): 20 mg via INTRAVENOUS
  Filled 2023-05-25 (×3): qty 4

## 2023-05-25 MED ORDER — MORPHINE SULFATE (PF) 4 MG/ML IV SOLN
4.0000 mg | Freq: Once | INTRAVENOUS | Status: AC | PRN
  Administered 2023-05-25: 4 mg via INTRAVENOUS
  Filled 2023-05-25: qty 1

## 2023-05-25 MED ORDER — HEPARIN SODIUM (PORCINE) 5000 UNIT/ML IJ SOLN
5000.0000 [IU] | Freq: Three times a day (TID) | INTRAMUSCULAR | Status: DC
  Administered 2023-05-25 – 2023-05-26 (×2): 5000 [IU] via SUBCUTANEOUS
  Filled 2023-05-25 (×4): qty 1

## 2023-05-25 MED ORDER — INSULIN ASPART 100 UNIT/ML IJ SOLN: 0.0000 [IU] | Freq: Every day | INTRAMUSCULAR | Status: DC

## 2023-05-25 MED ORDER — CHLORHEXIDINE GLUCONATE CLOTH 2 % EX PADS
6.0000 | MEDICATED_PAD | Freq: Every day | CUTANEOUS | Status: DC
  Filled 2023-05-25 (×2): qty 6

## 2023-05-25 MED ORDER — FERRIC CITRATE 1 GM 210 MG(FE) PO TABS
630.0000 mg | ORAL_TABLET | Freq: Three times a day (TID) | ORAL | Status: DC
  Administered 2023-05-26 (×2): 630 mg via ORAL
  Filled 2023-05-25 (×5): qty 3

## 2023-05-25 MED ORDER — INSULIN GLARGINE-YFGN 100 UNIT/ML ~~LOC~~ SOLN
20.0000 [IU] | Freq: Every day | SUBCUTANEOUS | Status: DC
  Administered 2023-05-25: 20 [IU] via SUBCUTANEOUS
  Filled 2023-05-25 (×2): qty 0.2

## 2023-05-25 MED ORDER — LEVETIRACETAM 750 MG PO TABS: 750.0000 mg | ORAL_TABLET | Freq: Two times a day (BID) | ORAL | Status: DC

## 2023-05-25 MED ORDER — MORPHINE SULFATE (PF) 2 MG/ML IV SOLN
2.0000 mg | INTRAVENOUS | Status: DC | PRN
  Administered 2023-05-26 (×2): 2 mg via INTRAVENOUS
  Filled 2023-05-25 (×2): qty 1

## 2023-05-25 MED ORDER — NITROGLYCERIN 0.4 MG SL SUBL
0.4000 mg | SUBLINGUAL_TABLET | SUBLINGUAL | Status: DC | PRN
  Administered 2023-05-25 (×2): 0.4 mg via SUBLINGUAL

## 2023-05-25 MED ORDER — HYDROCODONE-ACETAMINOPHEN 5-325 MG PO TABS
1.0000 | ORAL_TABLET | ORAL | Status: DC | PRN
Start: 2023-05-25 — End: 2023-05-26

## 2023-05-25 MED ORDER — ACETAMINOPHEN 325 MG RE SUPP: 650.0000 mg | Freq: Four times a day (QID) | RECTAL | Status: DC | PRN

## 2023-05-25 MED ORDER — ACETAMINOPHEN 325 MG PO TABS: 650.0000 mg | ORAL_TABLET | Freq: Four times a day (QID) | ORAL | Status: DC | PRN

## 2023-05-25 MED ORDER — ETHOSUXIMIDE 250 MG PO CAPS
250.0000 mg | ORAL_CAPSULE | Freq: Two times a day (BID) | ORAL | Status: DC
  Administered 2023-05-26: 250 mg via ORAL
  Filled 2023-05-25 (×3): qty 1

## 2023-05-25 MED ORDER — ONDANSETRON HCL 4 MG PO TABS: 4.0000 mg | ORAL_TABLET | Freq: Four times a day (QID) | ORAL | Status: DC | PRN

## 2023-05-25 MED ORDER — IOHEXOL 350 MG/ML SOLN
100.0000 mL | Freq: Once | INTRAVENOUS | Status: AC | PRN
  Administered 2023-05-25: 100 mL via INTRAVENOUS

## 2023-05-25 MED ORDER — RENA-VITE PO TABS
1.0000 | ORAL_TABLET | Freq: Every day | ORAL | Status: DC
  Administered 2023-05-26: 1 via ORAL
  Filled 2023-05-25: qty 1

## 2023-05-25 MED ORDER — FUROSEMIDE 10 MG/ML IJ SOLN
60.0000 mg | Freq: Once | INTRAMUSCULAR | Status: AC
  Administered 2023-05-25: 60 mg via INTRAVENOUS
  Filled 2023-05-25: qty 8

## 2023-05-25 MED ORDER — IRBESARTAN 75 MG PO TABS: 75.0000 mg | ORAL_TABLET | Freq: Every day | ORAL | Status: DC

## 2023-05-25 MED ORDER — METOPROLOL SUCCINATE ER 50 MG PO TB24
100.0000 mg | ORAL_TABLET | Freq: Two times a day (BID) | ORAL | Status: DC
  Administered 2023-05-25 – 2023-05-26 (×3): 100 mg via ORAL
  Filled 2023-05-25 (×3): qty 2

## 2023-05-25 MED ORDER — LEVOTHYROXINE SODIUM 50 MCG PO TABS
150.0000 ug | ORAL_TABLET | Freq: Every day | ORAL | Status: DC
  Administered 2023-05-26: 150 ug via ORAL
  Filled 2023-05-25: qty 3

## 2023-05-25 MED ORDER — INSULIN ASPART 100 UNIT/ML IJ SOLN: 0.0000 [IU] | Freq: Three times a day (TID) | INTRAMUSCULAR | Status: DC

## 2023-05-25 MED ORDER — METOPROLOL SUCCINATE ER 50 MG PO TB24: 100.0000 mg | ORAL_TABLET | Freq: Every day | ORAL | Status: DC

## 2023-05-25 MED ORDER — ONDANSETRON HCL 4 MG/2ML IJ SOLN: 4.0000 mg | Freq: Four times a day (QID) | INTRAMUSCULAR | Status: DC | PRN

## 2023-05-25 MED ORDER — B COMPLEX-C PO TABS
1.0000 | ORAL_TABLET | Freq: Every day | ORAL | Status: DC
Start: 2023-05-25 — End: 2023-05-26
  Administered 2023-05-25: 1 via ORAL
  Filled 2023-05-25 (×2): qty 1

## 2023-05-25 MED ORDER — ALBUTEROL SULFATE (2.5 MG/3ML) 0.083% IN NEBU: 2.5000 mg | INHALATION_SOLUTION | RESPIRATORY_TRACT | Status: DC | PRN

## 2023-05-25 MED ORDER — LIDOCAINE HCL (PF) 1 % IJ SOLN: 5.0000 mL | INTRAMUSCULAR | Status: DC | PRN

## 2023-05-25 MED ORDER — LEVETIRACETAM 250 MG PO TABS
250.0000 mg | ORAL_TABLET | ORAL | Status: DC
Start: 2023-05-25 — End: 2023-05-26
  Administered 2023-05-25: 250 mg via ORAL
  Filled 2023-05-25: qty 1

## 2023-05-25 NOTE — Assessment & Plan Note (Deleted)
Acute CHF Pulmonary hypertension on CTA chest 05/25/2023 Clinically fluid overloaded, BNP elevated to 846, and CTA chest showing cardiomegaly with pulmonary edema

## 2023-05-25 NOTE — Assessment & Plan Note (Deleted)
Continue Keppra Lamictal Zarontin

## 2023-05-25 NOTE — Assessment & Plan Note (Addendum)
Suspect secondary to hypertensive emergency History of negative stress test 04/2022 First troponin 16-will get a second troponin Follow-up echocardiogram to evaluate for wall motion abnormality Continue Nitropaste.  Morphine for breakthrough pain Cardiology consult

## 2023-05-25 NOTE — Progress Notes (Signed)
Heart Failure Navigator Progress Note  Assessed for Heart & Vascular TOC clinic readiness.  Patient does not meet criteria due to current [facility] patient of Minda Ditto, [place].  Navigator will sign off at this time.  Roxy Horseman, RN, BSN [facility] Heart Failure Navigator Secure Chat Only

## 2023-05-25 NOTE — Telephone Encounter (Signed)
Pt's mother called to check status of refill

## 2023-05-25 NOTE — ED Notes (Signed)
Spoke with family at bedside at this time.

## 2023-05-25 NOTE — ED Notes (Signed)
Pt and family at beside asked to review their test results from blood work and scans. I told the pt that this act was outside of my scope of practice and Dr. Para March was informed to come to the room for further review. Dr. Para March stated she would be with the pt shortly as she is reviewing the chart currently. Pt understood. Vitals are stable and I will continue monitoring.

## 2023-05-25 NOTE — ED Notes (Signed)
 Pt transported to dialysis at this time. Stable at time of departure.

## 2023-05-25 NOTE — Progress Notes (Signed)
Progress Note    Nina Johnston  GLO:[id number] DOB: [birth year]  DOA: 05/25/2023 PCP: Marguarite Arbour, MD      Brief Narrative:    Medical records reviewed and are as summarized below:  Nina Johnston is a 63 y.o. female medical history significant for ESRD on HD, seizure disorder on AEDs, DM, HTN, bipolar mood disorder, who presented to the hospital with chest pain that appears to radiate to the left arm and down to the abdomen.  She also complained of neck pain, left arm pain and sometimes numbness in her hands when she uses her Ipad.  She took an aspirin prior to admission.  She had a negative stress test in December 2023.       Assessment/Plan:   Principal Problem:   Acute CHF (congestive heart failure) (HCC) Active Problems:   Hypertensive emergency   Chest pain   End-stage renal disease on hemodialysis (HCC)   Spells of speech arrest   Chronic obstructive pulmonary disease (HCC)   Hypothyroidism   Seizure disorder (HCC)   Insulin dependent type 2 diabetes mellitus (HCC)   Anemia of chronic kidney failure, stage 5 (HCC)   Body mass index is 36.21 kg/m.  (Class 2 obesity)   Chest pain, neck pain, left arm pain and numbness: Troponins negative.  Suspect this is musculoskeletal in origin. X-ray of the cervical spine ordered showed degenerative disease of the cervical spine. She had a normal Lexiscan nuclear stress test in December 2023   Acute on chronic HFpEF: Chest x-ray showed pulmonary edema.  Plan for hemodialysis today.   BNP 846.3.  2D echo is pending.  Hypertensive emergency: BP is better.  Continue antihypertensives   ESRD: Plan for hemodialysis today.  Follow-up with nephrologist   Comorbidities include insulin-dependent diabetes mellitus, bipolar disorder, COPD, seizure disorder (on Keppra, Lamictal, Zarontin and Klonopin), hypothyroidism, gout       Diet Order             Diet heart healthy/carb modified Room service  appropriate? Yes; Fluid consistency: Thin  Diet effective now                            Consultants: Cardiologist Nephrologist  Procedures: None    Medications:    b complex vitamins  1 tablet Oral QHS   Chlorhexidine Gluconate Cloth  6 each Topical Q0600   DIALYVITE TABLET  1 tablet Oral Daily   ethosuximide  250 mg Oral BID   Febuxostat  80 mg Oral Daily   ferric citrate  630 mg Oral TID   furosemide  20 mg Intravenous Q12H   heparin  5,000 Units Subcutaneous Q8H   insulin aspart  0-5 Units Subcutaneous QHS   insulin aspart  0-6 Units Subcutaneous TID WC   insulin glargine-yfgn  20 Units Subcutaneous Daily   [START ON 05/26/2023] irbesartan  75 mg Oral Daily   irbesartan  75 mg Oral Daily   lamoTRIgine  300 mg Oral BID   levETIRAcetam  750 mg Oral BID   [START ON 05/26/2023] levothyroxine  150 mcg Oral QAC breakfast   [START ON 05/26/2023] metoprolol succinate  100 mg Oral Daily   metoprolol succinate  100 mg Oral BID   Continuous Infusions:   Anti-infectives (From admission, onward)    None              Family Communication/Anticipated D/C date and plan/Code Status  DVT prophylaxis: heparin injection 5,000 Units Start: 05/25/23 1400     Code Status: Full Code  Family Communication: None Disposition Plan: Plan to discharge home   Status is: Observation The patient will require care spanning > 2 midnights and should be moved to inpatient because: Fluid overload       Subjective:   Interval events noted.  She feels better today.  She still has some chest pain which she grades as 3/10 in severity.  She said pain was 10/10 when she came yesterday.  Complains of neck pain, pain in the left arm and intermittent numbness in the left hand, especially when she uses her iPad.  No shortness of breath.   Objective:    Vitals:   05/25/23 0800 05/25/23 0959 05/25/23 1100 05/25/23 1200  BP: (!) 153/66 (!) 159/62 (!) 146/65 (!) 161/68   Pulse: 62 66 (!) 57 (!) 59  Resp: 16 17 16 17   Temp:      TempSrc:      SpO2: 100% 100% 97% 96%  Weight:      Height:       No data found.  No intake or output data in the 24 hours ending 05/25/23 1237 Filed Weights   05/25/23 0036  Weight: 92.7 kg    Exam:  GEN: NAD SKIN: Warm and dry EYES: No pallor or icterus  ENT: MMM CV: RRR PULM: B/l rales, no wheezing ABD: soft, ND, NT, +BS CNS: AAO x 3, non focal EXT: Mild b/l leg edema, no tenderness        Data Reviewed:   I have personally reviewed following labs and imaging studies:  Labs: Labs show the following:   Basic Metabolic Panel: Recent Labs  Lab 05/25/23 0045  NA 139  K 5.2*  CL 97*  CO2 28  GLUCOSE 151*  BUN 57*  CREATININE 7.80*  CALCIUM 8.8*   GFR Estimated Creatinine Clearance: 8 mL/min (A) (by C-G formula based on SCr of 7.8 mg/dL (H)). Liver Function Tests: Recent Labs  Lab 05/25/23 0045  AST 17  ALT 18  ALKPHOS 136*  BILITOT 1.0  PROT 6.4*  ALBUMIN 3.8   Recent Labs  Lab 05/25/23 0045  LIPASE 37   No results for input(s): "AMMONIA" in the last 168 hours. Coagulation profile No results for input(s): "INR", "PROTIME" in the last 168 hours.  CBC: Recent Labs  Lab 05/25/23 0045  WBC 12.1*  HGB 9.8*  HCT 30.5*  MCV 111.7*  PLT 104*   Cardiac Enzymes: No results for input(s): "CKTOTAL", "CKMB", "CKMBINDEX", "TROPONINI" in the last 168 hours. BNP (last 3 results) No results for input(s): "PROBNP" in the last 8760 hours. CBG: No results for input(s): "GLUCAP" in the last 168 hours. D-Dimer: No results for input(s): "DDIMER" in the last 72 hours. Hgb A1c: No results for input(s): "HGBA1C" in the last 72 hours. Lipid Profile: No results for input(s): "CHOL", "HDL", "LDLCALC", "TRIG", "CHOLHDL", "LDLDIRECT" in the last 72 hours. Thyroid function studies: No results for input(s): "TSH", "T4TOTAL", "T3FREE", "THYROIDAB" in the last 72 hours.  Invalid input(s):  "FREET3" Anemia work up: No results for input(s): "VITAMINB12", "FOLATE", "FERRITIN", "TIBC", "IRON", "RETICCTPCT" in the last 72 hours. Sepsis Labs: Recent Labs  Lab 05/25/23 0045  WBC 12.1*    Microbiology No results found for this or any previous visit (from the past 240 hours).  Procedures and diagnostic studies:  DG Cervical Spine 2 or 3 views Result Date: 05/25/2023 CLINICAL DATA:  Neck pain  EXAM: CERVICAL SPINE - 2-3 VIEW COMPARISON:  None Available. FINDINGS: Normal alignment and prevertebral soft tissues. Multilevel degenerative changes with anterior large osteophytes at C4 through C6. Slight disc space narrowing at C6-7. Intact odontoid. Facets are aligned. No acute osseous finding. IMPRESSION: Cervical spine degenerative changes as above. No acute finding by plain radiography. Electronically Signed   By: Judie Petit.  Shick M.D.   On: 05/25/2023 09:21   CT Angio Chest/Abd/Pel for Dissection W and/or Wo Contrast Result Date: 05/25/2023 CLINICAL DATA:  Chest pain while lying on the couch. Sharp pain traveling up into the back of neck and both arms. EXAM: CT ANGIOGRAPHY CHEST, ABDOMEN AND PELVIS TECHNIQUE: Non-contrast CT of the chest was initially obtained. Multidetector CT imaging through the chest, abdomen and pelvis was performed using the standard protocol during bolus administration of intravenous contrast. Multiplanar reconstructed images and MIPs were obtained and reviewed to evaluate the vascular anatomy. RADIATION DOSE REDUCTION: This exam was performed according to the departmental dose-optimization program which includes automated exposure control, adjustment of the mA and/or kV according to patient size and/or use of iterative reconstruction technique. CONTRAST:  OMNIPAQUE IOHEXOL 350 MG/ML SOLN COMPARISON:  Same day chest radiograph; CTA chest 03/17/2022 and CT 10/23/2021 FINDINGS: CTA CHEST FINDINGS Cardiovascular: Cardiomegaly. No pericardial effusion. Normal caliber thoracic  aorta without intramural hematoma, penetrating atherosclerotic ulcer, or dissection. Coronary artery and aortic atherosclerotic calcification. No central pulmonary embolism. The main pulmonary artery is dilated measuring 34 mm. Mediastinum/Nodes: Multinodular enlarged heterogenous left thyroid lobe. Bowing of the posterior trachea compatible with expiratory phase exam. Esophagus is unremarkable. No thoracic adenopathy. Lungs/Pleura: Diffuse interlobular septal thickening with patchy ground-glass opacities in the lower lungs. No pleural effusion or pneumothorax. Musculoskeletal: No acute fracture. Review of the MIP images confirms the above findings. CTA ABDOMEN AND PELVIS FINDINGS VASCULAR The aorta and its mesenteric, renal, and iliac artery branches are patent without dissection. 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 No aortic aneurysm. Mixed density atherosclerotic plaque in the mid SMA causes moderate narrowing. Review of the MIP images confirms the above findings. NON-VASCULAR Hepatobiliary: Nodular hepatic contour. Cholecystectomy. No biliary dilation. Pancreas: Unremarkable. Spleen: Borderline splenomegaly measuring 13.4 cm in craniocaudal dimension. Adrenals/Urinary Tract: Left adrenal myolipoma. No follow-up recommended. Normal right adrenal gland. Bilateral cortical renal atrophy. Bilateral calyceal stones and/or parenchymal calcifications. No obstructing ureteral calculi or hydronephrosis. Nondistended bladder. Stomach/Bowel: Normal caliber large and small bowel. Colonic diverticulosis without diverticulitis. Stomach and appendix are within normal limits. Lymphatic: No lymphadenopathy. Reproductive: No acute abnormality. Other: No free intraperitoneal fluid or air.  Body wall edema. Musculoskeletal: No acute fracture. Review of the MIP images confirms the above findings. IMPRESSION: 1. No aortic aneurysm or dissection. 2. Cardiomegaly with pulmonary edema. 3. Dilated main pulmonary artery, which  can be seen with pulmonary hypertension. 4. Cirrhosis. 5. Atrophic kidneys with nonobstructing bilateral nephrolithiasis. 6. Multinodular enlarged heterogenous left thyroid lobe. Recommend nonemergent thyroid ultrasound if not previously performed. (Ref: J Am Coll Radiol. 2015 Feb;12(2): 143-50). Aortic Atherosclerosis (ICD10-I70.0). Electronically Signed   By: Minerva Fester M.D.   On: 05/25/2023 02:42   DG Chest Portable 1 View Result Date: 05/25/2023 CLINICAL DATA:  Chest pain EXAM: PORTABLE CHEST 1 VIEW COMPARISON:  09/05/2022 FINDINGS: Stable cardiomegaly. Loop recorder. Pulmonary vascular congestion and hazy interstitial opacities in the lower lungs. No pleural effusion or pneumothorax. No displaced rib fractures. IMPRESSION: Cardiomegaly with pulmonary edema. Electronically Signed   By: Minerva Fester M.D.   On: 05/25/2023 00:55  LOS: 0 days   Ngoc Detjen  [facility]   Pager on [email or web address]. If 7PM-7AM, please contact night-coverage at [email or web address]     05/25/2023, 12:37 PM

## 2023-05-25 NOTE — ED Notes (Signed)
Report given to Bonita Quin, RN in dialysis

## 2023-05-25 NOTE — Hospital Course (Signed)
 Marland Kitchen

## 2023-05-25 NOTE — ED Notes (Signed)
This RN just took over care for this patient as the patient just arrived back in the ED after dialysis.

## 2023-05-25 NOTE — Progress Notes (Signed)
Received patient in bed to unit.  Alert and oriented.  Informed consent signed and in chart.   TX duration: 3 hours  Patient tolerated well.  Transported back to the room  Alert, without acute distress.  Hand-off given to patient's nurse. Annabelle Harman for [employer]  Access used: left AVF Access issues: none  Total UF removed: 1L Medication(s) given: none Post HD VS: BP 169/60 P 63 resp 16 O2 Sat 97% 2lpm N/C Post HD weight: not obtained  Freddie Breech, RN [facility]

## 2023-05-25 NOTE — ED Triage Notes (Signed)
Pt felt chest pain while laying on the couch around 2200, pt states the sharp pain is traveling up the back of her neck and both arms.

## 2023-05-25 NOTE — H&P (Addendum)
History and Physical    Patient: Nina Johnston UEA:[id number] DOB: 21-Sep-1960 DOA: 05/25/2023 DOS: the patient was seen and examined on 05/25/2023 PCP: Marguarite Arbour, MD  Patient coming from: Home  Chief Complaint:  Chief Complaint  Patient presents with   Chest Pain    HPI: Nina Johnston is a 63 y.o. female with medical history significant for ESRD on HD, seizure disorder on AEDs, DM, HTN, bipolar mood disorder with  who presents to the ED with sudden onset chest heaviness radiating to her back and her left arm and abdomen.  She took an aspirin prior to arrival .  Has no associated nausea, vomiting or shortness of breath.  Bilateral lower extremity swelling, no pain.  Appears comfortable at the time of my assessment.  Of note, patient had a negative stress test 04/2022 .EGD with benign papule 02/2023. ED course and data review: Hypertensive to 195/70 on arrival and tachypneic to the low to mid 20s. Troponin 16 and BNP 846 WBC 12,000 With hemoglobin at baseline at 9.8. CMP notable for potassium 5.2.  Lipase and LFTs unremarkable except for elevated alk phos to 136 CTA aorta showed no aneurysm or dissection.  Did show cardiomegaly with pulmonary edema as well as findings to suggest cirrhosis and pulmonary hypertension. Patient was treated with topical nitroglycerin, morphine and given a dose of Lasix The ED provider spoke with nephrologist Dr. Wynelle Link will take her for dialysis in the a.m. Hospitalist consulted for admission.   Review of Systems: As mentioned in the history of present illness. All other systems reviewed and are negative.  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  On multiple psychotropic medications, and with DM>    Benign essential HTN  12/26/2014   Bipolar disorder (HCC)    Bronchitis    Carotid artery narrowing 12/22/2014   Cervical prolapse    CHF (congestive heart failure) (HCC)    CKD (chronic kidney disease)    Concussion 01/14/2014   Fall    COPD (chronic obstructive pulmonary disease) (HCC)    Coronary artery disease    Depression    Diabetes mellitus without complication (HCC)    Dialysis patient (HCC)    Diplopia    Dupuytren's contracture of foot    Essential (primary) hypertension 02/12/2015   Family history of thyroid problem    GERD (gastroesophageal reflux disease)    Gout    Heart disease    enlarged because of COPD   Heart murmur    Hepatomegaly    HLD (hyperlipidemia)    Hyperactive airway disease    Hyperkalemia    Hypertension    Hypothyroidism    Irritable colon    Morbid obesity (HCC)    Multiple thyroid nodules    OAB (overactive bladder)    Pernicious anemia    Post menopausal syndrome    Post-concussion syndrome 03/08/2014   Post-traumatic brain syndrome    Prolapsed uterus    PSVT (paroxysmal supraventricular tachycardia) (HCC)    Renal calculus    Renal disease    w/ GFR 29-may be due to diabetes   Seizures (HCC)    last seizure 2012. has had spells since with no  knowledge. 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    Transient alteration of awareness 11/10/2012   Urinary incontinence with continuous leakage 03/08/2014   Past Surgical History:  Procedure Laterality Date   A/V FISTULAGRAM Left 07/21/2016   Procedure: A/V Fistulagram;  Surgeon: Annice Needy, MD;  Location: [facility];  Service: Cardiovascular;  Laterality: Left;   A/V FISTULAGRAM Left 12/24/2016   Procedure: A/V Fistulagram;  Surgeon: Annice Needy, MD;  Location: [facility];  Service: Cardiovascular;  Laterality: Left;   A/V FISTULAGRAM Left 06/29/2017   Procedure: A/V FISTULAGRAM;  Surgeon: Annice Needy, MD;   Location: [facility];  Service: Cardiovascular;  Laterality: Left;   A/V FISTULAGRAM Left 03/08/2018   Procedure: A/V FISTULAGRAM;  Surgeon: Annice Needy, MD;  Location: [facility];  Service: Cardiovascular;  Laterality: Left;   A/V FISTULAGRAM Left 04/11/2019   Procedure: A/V FISTULAGRAM;  Surgeon: Annice Needy, MD;  Location: [facility];  Service: Cardiovascular;  Laterality: Left;   A/V SHUNT INTERVENTION N/A 07/21/2016   Procedure: A/V Shunt Intervention;  Surgeon: Annice Needy, MD;  Location: [facility];  Service: Cardiovascular;  Laterality: N/A;   A/V SHUNT INTERVENTION N/A 12/24/2016   Procedure: A/V SHUNT INTERVENTION;  Surgeon: Annice Needy, MD;  Location: [facility];  Service: Cardiovascular;  Laterality: N/A;   AVF     BIOPSY  02/23/2023   Procedure: BIOPSY;  Surgeon: Regis Bill, MD;  Location: [facility];  Service: Endoscopy;;   BREAST BIOPSY Left 06/12/2022   LEFT stereo bx, calcs, "X" clip-path pending   BREAST BIOPSY Left 06/12/2022   MM LT BREAST BX W LOC DEV 1ST LESION IMAGE BX SPEC STEREO GUIDE 06/12/2022 [facility]   CHOLECYSTECTOMY     COLONOSCOPY     COLONOSCOPY WITH PROPOFOL N/A 03/18/2017   Procedure: COLONOSCOPY WITH PROPOFOL;  Surgeon: Scot Jun, MD;  Location: [facility];  Service: Endoscopy;  Laterality: N/A;   COLONOSCOPY WITH PROPOFOL N/A 02/23/2023   Procedure: COLONOSCOPY WITH PROPOFOL;  Surgeon: Regis Bill, MD;  Location: [facility];  Service: Endoscopy;  Laterality: N/A;  DM & DIALYSIS PATIENT   ENDOMETRIAL BIOPSY     ablation, uterine   ESOPHAGEAL BRUSHING  02/23/2023   Procedure: ESOPHAGEAL BRUSHING;  Surgeon: Regis Bill, MD;  Location: [facility];  Service: Endoscopy;;   ESOPHAGOGASTRODUODENOSCOPY (EGD) WITH PROPOFOL N/A 03/18/2017   Procedure: ESOPHAGOGASTRODUODENOSCOPY (EGD) WITH PROPOFOL;  Surgeon: Scot Jun, MD;  Location: [facility] ENDOSCOPY;   Service: Endoscopy;  Laterality: N/A;   ESOPHAGOGASTRODUODENOSCOPY (EGD) WITH PROPOFOL N/A 02/23/2023   Procedure: ESOPHAGOGASTRODUODENOSCOPY (EGD) WITH PROPOFOL;  Surgeon: Regis Bill, MD;  Location: [facility];  Service: Endoscopy;  Laterality: N/A;   HERNIA REPAIR     HERNIA REPAIR     PERIPHERAL VASCULAR CATHETERIZATION N/A 08/23/2015   Procedure: Dialysis/Perma Catheter Insertion;  Surgeon: Annice Needy, MD;  Location: [facility];  Service: Cardiovascular;  Laterality: N/A;   PERIPHERAL VASCULAR CATHETERIZATION N/A 11/09/2015   Procedure: Dialysis/Perma Catheter Removal;  Surgeon: Renford Dills, MD;  Location: [facility];  Service: Cardiovascular;  Laterality: N/A;   PERIPHERAL VASCULAR CATHETERIZATION Left 04/23/2016   Procedure: A/V Fistulagram;  Surgeon: Annice Needy, MD;  Location: [facility];  Service: Cardiovascular;  Laterality: Left;  PORT A CATH REVISION     Social History:  reports that she has never smoked. She has never used smokeless tobacco. She reports that she does not drink alcohol and does not use drugs.  Allergies  Allergen Reactions   Cinnamon Other (See Comments)    Raises blood pressure   Procrit [Epoetin Alfa-Epbx] Other (See Comments)    Seizures, grand mal   Ace Inhibitors Cough   Azithromycin Other (See Comments)    seizures   Iron Other (See Comments)    Can not take in IV Form - causes seizures    Neomycin-Bacitracin Zn-Polymyx Other (See Comments)    Unknown   Other    Sulfa Antibiotics Other (See Comments)    Due to risks for seizures.    Family History  Problem Relation Age of Onset   Diabetes Mother    Lumbar disc disease Mother    Migraines Mother    Hypertension Mother    Cancer - Prostate Father        mild   Diabetes Father    Hypertension Father    Breast cancer Neg Hx    Ovarian cancer Neg Hx    Colon cancer Neg Hx    Heart disease Neg Hx    Kidney cancer Neg Hx    Bladder Cancer  Neg Hx     Prior to Admission medications   Medication Sig Start Date End Date Taking? Authorizing Provider  AURYXIA 1 GM 210 MG(Fe) tablet Take 630 mg by mouth 3 (three) times daily. 07/23/22   [provider]  b complex vitamins tablet Take 1 tablet by mouth at bedtime.     [provider]  B Complex-C-Folic Acid (DIALYVITE TABLET) TABS Take 1 tablet by mouth daily. 11/14/17   [provider]  chlorpheniramine-HYDROcodone (TUSSIONEX) 10-8 MG/5ML TAKE 5 ML BY MOUTH EVERY 12 HOURS AS NEEDED FOR COUGH 10/30/22   [provider]  clonazePAM (KLONOPIN) 1 MG tablet TAKE 1-2 TABLETS (1-2 MG TOTAL) BY MOUTH DAILY. 11/21/22   Dohmeier, Porfirio Mylar, MD  cloNIDine (CATAPRES - DOSED IN MG/24 HR) 0.1 mg/24hr patch Place 0.1 mg onto the skin once a week.    [provider]  Continuous Blood Gluc Sensor (DEXCOM G6 SENSOR) MISC Use 1 every 10 days 07/31/20   [provider]  Continuous Blood Gluc Transmit (DEXCOM G6 TRANSMITTER) MISC USE EVERY 3 MONTHS 11/06/20   [provider]  ethosuximide (ZARONTIN) 250 MG/5ML solution TAKE 7. 5 ML BY MOUTH TWICE A DAY TAKE AFTER HD TREATMENT ON DIALYSIS DAY. 02/25/23   Dohmeier, Porfirio Mylar, MD  famotidine (PEPCID) 40 MG tablet SMARTSIG:1 Tablet(s) By Mouth Every Evening 10/04/20   [provider]  Febuxostat 80 MG TABS Take 80 mg by mouth daily.    [provider]  fexofenadine (ALLEGRA) 180 MG tablet Take 180 mg by mouth daily.    [provider]  Fluticasone-Salmeterol (ADVAIR) 250-50 MCG/DOSE AEPB Inhale 1 puff into the lungs 2 (two) times daily. Patient not taking: Reported on 02/23/2023    [provider]  furosemide (LASIX) 20 MG tablet Take 30 mg by mouth 2 (two) times daily.     [provider]  Glucagon, rDNA, (GLUCAGON EMERGENCY) 1 MG KIT INJECT 1 MG INTO THE MUSCLE AS DIRECTED 10/30/22   [provider]  insulin glargine (LANTUS) 100 UNIT/ML injection Inject 22  Units into the skin at bedtime. 16 units on Monday,Wednesday,Friday, 12 units on dialysis days    [provider]  insulin lispro (HUMALOG) 100 UNIT/ML injection Inject 8-15 Units into the skin 3 (three) times daily with meals. Per sliding scale    [provider]  LAMICTAL 200 MG tablet TAKE 1.5 TABLETS (300 MG TOTAL) BY MOUTH 2 (TWO) TIMES DAILY. Heide Guile NAME 03/26/23   Dohmeier, Porfirio Mylar, MD  levETIRAcetam (KEPPRA) 250 MG tablet Take Bid on non -dialysis days, 3 a day on hemodialysis days. 08/12/22   Dohmeier, Porfirio Mylar, MD  levothyroxine (SYNTHROID) 150 MCG tablet Take 150 mcg by mouth daily before breakfast.     [provider]  lidocaine-prilocaine (EMLA) cream Apply topically as directed. 01/15/20   [provider]  Methoxy PEG-Epoetin Beta (MIRCERA IJ) Inject as directed. Receives w/ dialysis, unsure how often right now    [provider]  metoprolol succinate (TOPROL XL) 100 MG 24 hr tablet Take 1 tablet (100 mg total) by mouth in the morning and at bedtime. Take with or immediately following a meal. 10/01/22   Lanier Prude, MD  montelukast (SINGULAIR) 10 MG tablet SMARTSIG:1 Tablet(s) By Mouth Every Evening 12/05/20   [provider]  nystatin-triamcinolone (MYCOLOG II) cream Apply 1 application topically 2 (two) times daily. 10/22/16   Defrancesco, Prentice Docker, MD  pantoprazole (PROTONIX) 40 MG tablet Take 40 mg by mouth every morning.     [provider]  Probiotic Product (ALIGN) 4 MG CAPS Take 4 mg by mouth daily.     [provider]  sevelamer carbonate (RENVELA) 800 MG tablet Take 3,200 mg by mouth daily. 11/07/20   [provider]  UNIFINE PENTIPS 32G X 4 MM MISC USE UP TO 5 TIMES DAILY AS DIRECTED 11/22/17   [provider]  valsartan (DIOVAN) 80 MG tablet Take 1 tablet (80 mg total) by mouth daily. 10/01/22   Lanier Prude, MD    Physical Exam: Vitals:   05/25/23 0200 05/25/23 0213 05/25/23  0300 05/25/23 0330  BP: (!) 175/65  (!) 150/59 (!) 152/60  Pulse: 63  64 63  Resp: (!) 23  (!) 22 20  Temp:      TempSrc:      SpO2: 99% 99% 97% 95%  Weight:      Height:       Physical Exam Vitals and nursing note reviewed.  Constitutional:      General: She is not in acute distress. HENT:     Head: Normocephalic and atraumatic.  Cardiovascular:     Rate and Rhythm: Normal rate and regular rhythm.     Heart sounds: Normal heart sounds.  Pulmonary:     Effort: Tachypnea present.     Breath sounds: Normal breath sounds.  Abdominal:     Palpations: Abdomen is soft.     Tenderness: There is no abdominal tenderness.  Neurological:     Mental Status: Mental status is at baseline.     Labs on Admission: I have personally reviewed following labs and imaging studies  CBC: Recent Labs  Lab 05/25/23 0045  WBC 12.1*  HGB 9.8*  HCT 30.5*  MCV 111.7*  PLT 104*   Basic Metabolic Panel: Recent Labs  Lab 05/25/23 0045  NA 139  K 5.2*  CL 97*  CO2 28  GLUCOSE 151*  BUN 57*  CREATININE 7.80*  CALCIUM 8.8*   GFR: Estimated Creatinine Clearance: 8 mL/min (A) (by C-G formula based on SCr of 7.8 mg/dL (H)). Liver Function Tests: Recent Labs  Lab 05/25/23 0045  AST 17  ALT  18  ALKPHOS 136*  BILITOT 1.0  PROT 6.4*  ALBUMIN 3.8   Recent Labs  Lab 05/25/23 0045  LIPASE 37   No results for input(s): "AMMONIA" in the last 168 hours. Coagulation Profile: No results for input(s): "INR", "PROTIME" in the last 168 hours. Cardiac Enzymes: No results for input(s): "CKTOTAL", "CKMB", "CKMBINDEX", "TROPONINI" in the last 168 hours. BNP (last 3 results) No results for input(s): "PROBNP" in the last 8760 hours. HbA1C: No results for input(s): "HGBA1C" in the last 72 hours. CBG: No results for input(s): "GLUCAP" in the last 168 hours. Lipid Profile: No results for input(s): "CHOL", "HDL", "LDLCALC", "TRIG", "CHOLHDL", "LDLDIRECT" in the last 72 hours. Thyroid Function  Tests: No results for input(s): "TSH", "T4TOTAL", "FREET4", "T3FREE", "THYROIDAB" in the last 72 hours. Anemia Panel: No results for input(s): "VITAMINB12", "FOLATE", "FERRITIN", "TIBC", "IRON", "RETICCTPCT" in the last 72 hours. Urine analysis:    Component Value Date/Time   COLORURINE YELLOW 02/12/2018 1501   APPEARANCEUR CLOUDY (A) 02/12/2018 1501   APPEARANCEUR Cloudy (A) 01/20/2018 1611   LABSPEC 1.020 02/12/2018 1501   LABSPEC 1.008 07/04/2011 0859   PHURINE 7.0 02/12/2018 1501   GLUCOSEU NEGATIVE 02/12/2018 1501   GLUCOSEU Negative 07/04/2011 0859   HGBUR TRACE (A) 02/12/2018 1501   BILIRUBINUR NEGATIVE 02/12/2018 1501   BILIRUBINUR Negative 01/20/2018 1611   BILIRUBINUR Negative 07/04/2011 0859   KETONESUR NEGATIVE 02/12/2018 1501   PROTEINUR 100 (A) 02/12/2018 1501   UROBILINOGEN 0.2 11/04/2017 1544   NITRITE NEGATIVE 02/12/2018 1501   LEUKOCYTESUR MODERATE (A) 02/12/2018 1501   LEUKOCYTESUR 1+ (A) 01/20/2018 1611   LEUKOCYTESUR Negative 07/04/2011 0859    Radiological Exams on Admission: CT Angio Chest/Abd/Pel for Dissection W and/or Wo Contrast Result Date: 05/25/2023 CLINICAL DATA:  Chest pain while lying on the couch. Sharp pain traveling up into the back of neck and both arms. EXAM: CT ANGIOGRAPHY CHEST, ABDOMEN AND PELVIS TECHNIQUE: Non-contrast CT of the chest was initially obtained. Multidetector CT imaging through the chest, abdomen and pelvis was performed using the standard protocol during bolus administration of intravenous contrast. Multiplanar reconstructed images and MIPs were obtained and reviewed to evaluate the vascular anatomy. RADIATION DOSE REDUCTION: This exam was performed according to the departmental dose-optimization program which includes automated exposure control, adjustment of the mA and/or kV according to patient size and/or use of iterative reconstruction technique. CONTRAST:  OMNIPAQUE IOHEXOL 350 MG/ML SOLN COMPARISON:  Same day chest  radiograph; CTA chest 03/17/2022 and CT 10/23/2021 FINDINGS: CTA CHEST FINDINGS Cardiovascular: Cardiomegaly. No pericardial effusion. Normal caliber thoracic aorta without intramural hematoma, penetrating atherosclerotic ulcer, or dissection. Coronary artery and aortic atherosclerotic calcification. No central pulmonary embolism. The main pulmonary artery is dilated measuring 34 mm. Mediastinum/Nodes: Multinodular enlarged heterogenous left thyroid lobe. Bowing of the posterior trachea compatible with expiratory phase exam. Esophagus is unremarkable. No thoracic adenopathy. Lungs/Pleura: Diffuse interlobular septal thickening with patchy ground-glass opacities in the lower lungs. No pleural effusion or pneumothorax. Musculoskeletal: No acute fracture. Review of the MIP images confirms the above findings. CTA ABDOMEN AND PELVIS FINDINGS VASCULAR The aorta and its mesenteric, renal, and iliac artery branches are patent without dissection. 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 No aortic aneurysm. Mixed density atherosclerotic plaque in the mid SMA causes moderate narrowing. Review of the MIP images confirms the above findings. NON-VASCULAR Hepatobiliary: Nodular hepatic contour. Cholecystectomy. No biliary dilation. Pancreas: Unremarkable. Spleen: Borderline splenomegaly measuring 13.4 cm in craniocaudal dimension. Adrenals/Urinary Tract: Left adrenal myolipoma. No follow-up recommended. Normal  right adrenal gland. Bilateral cortical renal atrophy. Bilateral calyceal stones and/or parenchymal calcifications. No obstructing ureteral calculi or hydronephrosis. Nondistended bladder. Stomach/Bowel: Normal caliber large and small bowel. Colonic diverticulosis without diverticulitis. Stomach and appendix are within normal limits. Lymphatic: No lymphadenopathy. Reproductive: No acute abnormality. Other: No free intraperitoneal fluid or air.  Body wall edema. Musculoskeletal: No acute fracture. Review of the MIP images  confirms the above findings. IMPRESSION: 1. No aortic aneurysm or dissection. 2. Cardiomegaly with pulmonary edema. 3. Dilated main pulmonary artery, which can be seen with pulmonary hypertension. 4. Cirrhosis. 5. Atrophic kidneys with nonobstructing bilateral nephrolithiasis. 6. Multinodular enlarged heterogenous left thyroid lobe. Recommend nonemergent thyroid ultrasound if not previously performed. (Ref: J Am Coll Radiol. 2015 Feb;12(2): 143-50). Aortic Atherosclerosis (ICD10-I70.0). Electronically Signed   By: Minerva Fester M.D.   On: 05/25/2023 02:42   DG Chest Portable 1 View Result Date: 05/25/2023 CLINICAL DATA:  Chest pain EXAM: PORTABLE CHEST 1 VIEW COMPARISON:  09/05/2022 FINDINGS: Stable cardiomegaly. Loop recorder. Pulmonary vascular congestion and hazy interstitial opacities in the lower lungs. No pleural effusion or pneumothorax. No displaced rib fractures. IMPRESSION: Cardiomegaly with pulmonary edema. Electronically Signed   By: Minerva Fester M.D.   On: 05/25/2023 00:55     Data Reviewed: Relevant notes from primary care and specialist visits, past discharge summaries as available in EHR, including Care Everywhere. Prior diagnostic testing as pertinent to current admission diagnoses Updated medications and problem lists for reconciliation ED course, including vitals, labs, imaging, treatment and response to treatment Triage notes, nursing and pharmacy notes and ED provider's notes Notable results as noted in HPI   Assessment and Plan: * Acute CHF (congestive heart failure) (HCC) Hypertensive emergency Pulmonary hypertension on CTA chest 05/25/2023 Clinically fluid overloaded, BNP elevated to 846, and CTA chest showing cardiomegaly with pulmonary edema Continue Nitropaste and IV Lasix Nephrology aware.  For dialysis this a.m. BP control-continue home valsartan and metoprolol(holding home clonidine patch pending verification) Daily weights with intake and output  monitoring Echocardiogram  Chest pain Suspect secondary to hypertensive emergency History of negative stress test 04/2022 First troponin 16-will get a second troponin Follow-up echocardiogram to evaluate for wall motion abnormality Continue Nitropaste.  Morphine for breakthrough pain Cardiology consult  End-stage renal disease on hemodialysis [facility]) Nephrology consulted for continuation of dialysis  Anemia of chronic kidney failure, stage 5 (HCC) Continue Auryxia and vitamin B complex  Insulin dependent type 2 diabetes mellitus (HCC) Continue basal insulin Sliding scale coverage  Seizure disorder (HCC) Speech arrest spells Continue Keppra Lamictal Zarontin, Klonopin-pharmacy consulted as patient takes Keppra and Zarontin on dialysis days only   Hypothyroidism Continue levothyroxine  Chronic obstructive pulmonary disease (HCC) Continue home inhalers with DuoNebs as needed  Bipolar affective disorder (HCC)          DVT prophylaxis: Heparin  Consults: Nephrology, cardiology  Advance Care Planning:   Code Status: Prior   Family Communication: brother at bedside  Disposition Plan: Back to previous home environment  Severity of Illness: The appropriate patient status for this patient is OBSERVATION. Observation status is judged to be reasonable and necessary in order to provide the required intensity of service to ensure the patient's safety. The patient's presenting symptoms, physical exam findings, and initial radiographic and laboratory data in the context of their medical condition is felt to place them at decreased risk for further clinical deterioration. Furthermore, it is anticipated that the patient will be medically stable for discharge from the hospital within 2 midnights of admission.  Author: Andris Baumann, MD 05/25/2023 3:57 AM  For on call review [email or web address].

## 2023-05-25 NOTE — Assessment & Plan Note (Signed)
 Continue home inhalers with DuoNebs as needed

## 2023-05-25 NOTE — Assessment & Plan Note (Signed)
Continue Auryxia and vitamin B complex

## 2023-05-25 NOTE — ED Notes (Signed)
Called CCMD to place pt on central monitoring

## 2023-05-25 NOTE — Telephone Encounter (Signed)
Last seen on 12/02/22 Follow up scheduled on 06/04/23 Last filled on 01/05/23 #45 tablets (90 day supply) Rx pending to be signed

## 2023-05-25 NOTE — Assessment & Plan Note (Signed)
Continue levothyroxine 

## 2023-05-25 NOTE — Assessment & Plan Note (Signed)
 Continue basal insulin Sliding scale coverage

## 2023-05-25 NOTE — ED Provider Notes (Signed)
[facility] Provider Note    Event Date/Time   First MD Initiated Contact with Patient 05/25/23 0019     (approximate)   History   Chest pain  HPI  Nina Johnston is a 63 y.o. female with abrupt onset of chest pain around 10 PM tonight.  Reports to heaviness on her chest radiate to her arm but also shoots towards her back and feels like it goes all the way down into her mid abdomen now.  She reports that she has not had symptoms like this before.  She does not have a known history of heart disease.  She last had her dialysis on Friday and is due on Monday.  She had slight swelling in her ankles but reports that is not unusual and she is not short of breath  She took aspirin prior to arrival with EMS.  She reports ongoing sense of moderate chest pressure.  Radiates to the neck and left arm.  No nausea vomiting denies abdominal pain but reports the pain seems like it is shooting down into her upper abdomen now at this time.     Physical Exam   Triage Vital Signs: ED Triage Vitals  Encounter Vitals Group     BP      Systolic BP Percentile      Diastolic BP Percentile      Pulse      Resp      Temp      Temp src      SpO2      Weight      Height      Head Circumference      Peak Flow      Pain Score      Pain Loc      Pain Education      Exclude from Growth Chart     Most recent vital signs: Vitals:   05/25/23 0200 05/25/23 0213  BP: (!) 175/65   Pulse: 63   Resp: (!) 23   Temp:    SpO2: 99% 99%     General: Awake, no distress.  CV:  Good peripheral perfusion.  Normal tones and rate Resp:  Normal effort.  Clear bilateral Abd:  No distention.  Soft nontender nondistended Other:  Mild bilateral lower extremity ankle edema no severe edema or anasarca.    ED Results / Procedures / Treatments   Labs (all labs ordered are listed, but only abnormal results are displayed) Labs Reviewed  CBC - Abnormal; Notable for the following  components:      Result Value   WBC 12.1 (*)    RBC 2.73 (*)    Hemoglobin 9.8 (*)    HCT 30.5 (*)    MCV 111.7 (*)    MCH 35.9 (*)    Platelets 104 (*)    All other components within normal limits  COMPREHENSIVE METABOLIC PANEL - Abnormal; Notable for the following components:   Potassium 5.2 (*)    Chloride 97 (*)    Glucose, Bld 151 (*)    BUN 57 (*)    Creatinine, Ser 7.80 (*)    Calcium 8.8 (*)    Total Protein 6.4 (*)    Alkaline Phosphatase 136 (*)    GFR, Estimated 5 (*)    All other components within normal limits  BRAIN NATRIURETIC PEPTIDE - Abnormal; Notable for the following components:   B Natriuretic Peptide 846.3 (*)    All other components within normal  limits  LIPASE, BLOOD  HEPATITIS B SURFACE ANTIGEN  HEPATITIS B SURFACE ANTIBODY, QUANTITATIVE  TROPONIN I (HIGH SENSITIVITY)  TROPONIN I (HIGH SENSITIVITY)     EKG  And interpreted by me at 0045 heart rate 60 QRS 100 QTc 500 Sinus rhythm no evidence of obvious ischemia though there is slight T wave abnormality possibly artifactual noted in lead II.  No frank ischemia is noted   RADIOLOGY  CT Angio Chest/Abd/Pel for Dissection W and/or Wo Contrast Result Date: 05/25/2023 CLINICAL DATA:  Chest pain while lying on the couch. Sharp pain traveling up into the back of neck and both arms. EXAM: CT ANGIOGRAPHY CHEST, ABDOMEN AND PELVIS TECHNIQUE: Non-contrast CT of the chest was initially obtained. Multidetector CT imaging through the chest, abdomen and pelvis was performed using the standard protocol during bolus administration of intravenous contrast. Multiplanar reconstructed images and MIPs were obtained and reviewed to evaluate the vascular anatomy. RADIATION DOSE REDUCTION: This exam was performed according to the departmental dose-optimization program which includes automated exposure control, adjustment of the mA and/or kV according to patient size and/or use of iterative reconstruction technique. CONTRAST:   OMNIPAQUE IOHEXOL 350 MG/ML SOLN COMPARISON:  Same day chest radiograph; CTA chest 03/17/2022 and CT 10/23/2021 FINDINGS: CTA CHEST FINDINGS Cardiovascular: Cardiomegaly. No pericardial effusion. Normal caliber thoracic aorta without intramural hematoma, penetrating atherosclerotic ulcer, or dissection. Coronary artery and aortic atherosclerotic calcification. No central pulmonary embolism. The main pulmonary artery is dilated measuring 34 mm. Mediastinum/Nodes: Multinodular enlarged heterogenous left thyroid lobe. Bowing of the posterior trachea compatible with expiratory phase exam. Esophagus is unremarkable. No thoracic adenopathy. Lungs/Pleura: Diffuse interlobular septal thickening with patchy ground-glass opacities in the lower lungs. No pleural effusion or pneumothorax. Musculoskeletal: No acute fracture. Review of the MIP images confirms the above findings. CTA ABDOMEN AND PELVIS FINDINGS VASCULAR The aorta and its mesenteric, renal, and iliac artery branches are patent without dissection. 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 No aortic aneurysm. Mixed density atherosclerotic plaque in the mid SMA causes moderate narrowing. Review of the MIP images confirms the above findings. NON-VASCULAR Hepatobiliary: Nodular hepatic contour. Cholecystectomy. No biliary dilation. Pancreas: Unremarkable. Spleen: Borderline splenomegaly measuring 13.4 cm in craniocaudal dimension. Adrenals/Urinary Tract: Left adrenal myolipoma. No follow-up recommended. Normal right adrenal gland. Bilateral cortical renal atrophy. Bilateral calyceal stones and/or parenchymal calcifications. No obstructing ureteral calculi or hydronephrosis. Nondistended bladder. Stomach/Bowel: Normal caliber large and small bowel. Colonic diverticulosis without diverticulitis. Stomach and appendix are within normal limits. Lymphatic: No lymphadenopathy. Reproductive: No acute abnormality. Other: No free intraperitoneal fluid or air.  Body wall  edema. Musculoskeletal: No acute fracture. Review of the MIP images confirms the above findings. IMPRESSION: 1. No aortic aneurysm or dissection. 2. Cardiomegaly with pulmonary edema. 3. Dilated main pulmonary artery, which can be seen with pulmonary hypertension. 4. Cirrhosis. 5. Atrophic kidneys with nonobstructing bilateral nephrolithiasis. 6. Multinodular enlarged heterogenous left thyroid lobe. Recommend nonemergent thyroid ultrasound if not previously performed. (Ref: J Am Coll Radiol. 2015 Feb;12(2): 143-50). Aortic Atherosclerosis (ICD10-I70.0). Electronically Signed   By: Minerva Fester M.D.   On: 05/25/2023 02:42   DG Chest Portable 1 View Result Date: 05/25/2023 CLINICAL DATA:  Chest pain EXAM: PORTABLE CHEST 1 VIEW COMPARISON:  09/05/2022 FINDINGS: Stable cardiomegaly. Loop recorder. Pulmonary vascular congestion and hazy interstitial opacities in the lower lungs. No pleural effusion or pneumothorax. No displaced rib fractures. IMPRESSION: Cardiomegaly with pulmonary edema. Electronically Signed   By: Minerva Fester M.D.   On: 05/25/2023 00:55  PROCEDURES:  Critical Care performed: Yes, see critical care procedure note(s)  CRITICAL CARE Performed by: Sharyn Creamer   Total critical care time: 30 minutes  Critical care time was exclusive of separately billable procedures and treating other patients.  Critical care was necessary to treat or prevent imminent or life-threatening deterioration.  Critical care was time spent personally by me on the following activities: development of treatment plan with patient and/or surrogate as well as nursing, discussions with consultants, evaluation of patient's response to treatment, examination of patient, obtaining history from patient or surrogate, ordering and performing treatments and interventions, ordering and review of laboratory studies, ordering and review of radiographic studies, pulse oximetry and re-evaluation of patient's  condition.  Procedures   MEDICATIONS ORDERED IN ED: Medications  nitroGLYCERIN (NITROSTAT) SL tablet 0.4 mg (0.4 mg Sublingual Given 05/25/23 0200)  Chlorhexidine Gluconate Cloth 2 % PADS 6 each (has no administration in time range)  lidocaine (PF) (XYLOCAINE) 1 % injection 5 mL (has no administration in time range)  nitroGLYCERIN (NITROGLYN) 2 % ointment 1 inch (1 inch Topical Given 05/25/23 0040)  morphine (PF) 4 MG/ML injection 4 mg (4 mg Intravenous Given 05/25/23 0058)  morphine (PF) 4 MG/ML injection 4 mg (4 mg Intravenous Given 05/25/23 0151)  iohexol (OMNIPAQUE) 350 MG/ML injection 100 mL (100 mLs Intravenous Contrast Given 05/25/23 0213)  furosemide (LASIX) injection 60 mg (60 mg Intravenous Given 05/25/23 0231)     IMPRESSION / MDM / ASSESSMENT AND PLAN / ED COURSE  I reviewed the triage vital signs and the nursing notes.                              Differential diagnosis includes, but is not limited to, hypertensive emergency, flash pulmonary edema, ACS, dissection given the advancing nature of pain referral towards her back and abdomen, less likely felt to be volume overloaded, thromboembolism, pneumothorax, infection.  Denies infectious symptoms.  Recently had COVID a month ago but reports fully recovered from that  Patient's presentation is most consistent with acute presentation with potential threat to life or bodily function.  Workup reassuring at this point considering troponin negative however there is evidence of pulmonary vascular congestion and I suspect volume overload possibly/borderline hypertensive emergency.  Blood pressures improving symptoms improving chest discomfort improved as of 2:20 AM.  Consulted with and discussed case with Dr. Wynelle Link and nephrology team will see patient in AM.   The patient is on the cardiac monitor to evaluate for evidence of arrhythmia and/or significant heart rate changes.  CT Angio Chest/Abd/Pel for Dissection W and/or Wo  Contrast Result Date: 05/25/2023 CLINICAL DATA:  Chest pain while lying on the couch. Sharp pain traveling up into the back of neck and both arms. EXAM: CT ANGIOGRAPHY CHEST, ABDOMEN AND PELVIS TECHNIQUE: Non-contrast CT of the chest was initially obtained. Multidetector CT imaging through the chest, abdomen and pelvis was performed using the standard protocol during bolus administration of intravenous contrast. Multiplanar reconstructed images and MIPs were obtained and reviewed to evaluate the vascular anatomy. RADIATION DOSE REDUCTION: This exam was performed according to the departmental dose-optimization program which includes automated exposure control, adjustment of the mA and/or kV according to patient size and/or use of iterative reconstruction technique. CONTRAST:  OMNIPAQUE IOHEXOL 350 MG/ML SOLN COMPARISON:  Same day chest radiograph; CTA chest 03/17/2022 and CT 10/23/2021 FINDINGS: CTA CHEST FINDINGS Cardiovascular: Cardiomegaly. No pericardial effusion. Normal caliber thoracic aorta without intramural hematoma,  penetrating atherosclerotic ulcer, or dissection. Coronary artery and aortic atherosclerotic calcification. No central pulmonary embolism. The main pulmonary artery is dilated measuring 34 mm. Mediastinum/Nodes: Multinodular enlarged heterogenous left thyroid lobe. Bowing of the posterior trachea compatible with expiratory phase exam. Esophagus is unremarkable. No thoracic adenopathy. Lungs/Pleura: Diffuse interlobular septal thickening with patchy ground-glass opacities in the lower lungs. No pleural effusion or pneumothorax. Musculoskeletal: No acute fracture. Review of the MIP images confirms the above findings. CTA ABDOMEN AND PELVIS FINDINGS VASCULAR The aorta and its mesenteric, renal, and iliac artery branches are patent without dissection. 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 No aortic aneurysm. Mixed density atherosclerotic plaque in the mid SMA causes moderate narrowing.  Review of the MIP images confirms the above findings. NON-VASCULAR Hepatobiliary: Nodular hepatic contour. Cholecystectomy. No biliary dilation. Pancreas: Unremarkable. Spleen: Borderline splenomegaly measuring 13.4 cm in craniocaudal dimension. Adrenals/Urinary Tract: Left adrenal myolipoma. No follow-up recommended. Normal right adrenal gland. Bilateral cortical renal atrophy. Bilateral calyceal stones and/or parenchymal calcifications. No obstructing ureteral calculi or hydronephrosis. Nondistended bladder. Stomach/Bowel: Normal caliber large and small bowel. Colonic diverticulosis without diverticulitis. Stomach and appendix are within normal limits. Lymphatic: No lymphadenopathy. Reproductive: No acute abnormality. Other: No free intraperitoneal fluid or air.  Body wall edema. Musculoskeletal: No acute fracture. Review of the MIP images confirms the above findings. IMPRESSION: 1. No aortic aneurysm or dissection. 2. Cardiomegaly with pulmonary edema. 3. Dilated main pulmonary artery, which can be seen with pulmonary hypertension. 4. Cirrhosis. 5. Atrophic kidneys with nonobstructing bilateral nephrolithiasis. 6. Multinodular enlarged heterogenous left thyroid lobe. Recommend nonemergent thyroid ultrasound if not previously performed. (Ref: J Am Coll Radiol. 2015 Feb;12(2): 143-50). Aortic Atherosclerosis (ICD10-I70.0). Electronically Signed   By: Minerva Fester M.D.   On: 05/25/2023 02:42   DG Chest Portable 1 View Result Date: 05/25/2023 CLINICAL DATA:  Chest pain EXAM: PORTABLE CHEST 1 VIEW COMPARISON:  09/05/2022 FINDINGS: Stable cardiomegaly. Loop recorder. Pulmonary vascular congestion and hazy interstitial opacities in the lower lungs. No pleural effusion or pneumothorax. No displaced rib fractures. IMPRESSION: Cardiomegaly with pulmonary edema. Electronically Signed   By: Minerva Fester M.D.   On: 05/25/2023 00:55     CT imaging without acute dissection notable is cardiomegaly with pulmonary  edema.  Thyroid nodule also noted  ----------------------------------------- 3:01 AM on 05/25/2023 ----------------------------------------- Patient understanding comfortable plan for admission.  Consulted with the hospitalist for admission, anticipate nephrology consult to occur this morning with request for early hemodialysis already made to Dr. Wynelle Link.  Discussed with hospitalist Dr. Para March  Vitals:   05/25/23 0200 05/25/23 0213  BP: (!) 175/65   Pulse: 63   Resp: (!) 23   Temp:    SpO2: 99% 99%        FINAL CLINICAL IMPRESSION(S) / ED DIAGNOSES   Final diagnoses:  Hypertensive emergency  Acute congestive heart failure, unspecified heart failure type (HCC)  Thyroid nodule     Rx / DC Orders   ED Discharge Orders     None        Note:  This document was prepared using Dragon voice recognition software and may include unintentional dictation errors.   Sharyn Creamer, MD 05/25/23 [phone]

## 2023-05-25 NOTE — ED Notes (Signed)
Meal tray delivered at this time. 

## 2023-05-25 NOTE — Telephone Encounter (Signed)
Last seen on 12/02/22 Follow up scheduled on 06/04/23 Last filled on 01/05/23 #45 tablets (90 day supply) Rx pending to be signed  I don't see in last note you wanted pt to continue Rx?

## 2023-05-25 NOTE — Assessment & Plan Note (Addendum)
Speech arrest spells Continue Keppra Lamictal Zarontin, Klonopin-pharmacy consulted as patient takes Keppra and Zarontin on dialysis days only

## 2023-05-25 NOTE — Consult Note (Signed)
[facility] CLINIC CARDIOLOGY CONSULT NOTE       Patient ID: Nina Johnston MRN: [id number] DOB/AGE: 08-23-1960 63 y.o.  Admit date: 05/25/2023 Referring Physician Dr. Lindajo Royal Primary Physician Sparks, Duane Lope, MD  Primary Cardiologist Minda Ditto, [place] Reason for Consultation AoCHF  HPI: Nina Johnston is a 63 y.o. female  with a past medical history of hypertension, type 2 diabetes, hyperlipidemia, mild aortic valve stenosis, LVH, OSA on CPAP, bilateral carotid artery stenosis, ESRD on HD (MWF) who presented to the ED on 05/25/2023 for chest pain. Cardiology was consulted for further evaluation.   Patient reports that last night she was having trouble falling asleep but eventually did and woke up with chest discomfort.  States that she also had headache and felt short of breath when she woke up.  Given her symptoms she decided to come to the ED for further evaluation.  Workup in the ED notable for creatinine 7.80, potassium 5.2, hemoglobin 9.8, WBC 12.1.  BNP elevated at 846.  Troponin trended 13 > 16.  Chest x-ray with pulmonary edema.  She has no EKG for review.  She was started on IV Lasix in the ED.  At the time of my evaluation this morning patient states that she feels better overall.  We discussed her symptoms in further detail.  She states that her chest pain has resolved as well as her headache since her blood pressure has improved.  She denies any other similar episodes.  Denies any palpitation symptoms.  Also denies any swelling or missed dialysis sessions.  States that she does not really make urine.  She is lying flat at the time of my evaluation and appears comfortable.  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  On multiple psychotropic medications, and with DM>    Benign essential HTN 12/26/2014   Bipolar disorder (HCC)    Bronchitis    Carotid artery narrowing 12/22/2014   Cervical prolapse    CHF (congestive heart failure) (HCC)    CKD (chronic kidney disease)    Concussion 01/14/2014   Fall    COPD (chronic obstructive pulmonary disease) (HCC)    Coronary artery disease    Depression    Diabetes mellitus without complication (HCC)    Dialysis patient (HCC)    Diplopia    Dupuytren's contracture of foot    Essential (primary) hypertension 02/12/2015   Family history of thyroid problem    GERD (gastroesophageal reflux disease)    Gout    Heart disease    enlarged because of COPD   Heart murmur    Hepatomegaly    HLD (hyperlipidemia)    Hyperactive airway disease    Hyperkalemia    Hypertension    Hypothyroidism    Irritable colon    Morbid obesity (HCC)    Multiple thyroid nodules    OAB (overactive bladder)    Pernicious anemia    Post menopausal syndrome    Post-concussion syndrome 03/08/2014   Post-traumatic brain syndrome    Prolapsed uterus    PSVT (paroxysmal supraventricular tachycardia) (HCC)    Renal calculus  Renal disease    w/ GFR 29-may be due to diabetes   Seizures (HCC)    last seizure 2012. has had spells since with no knowledge. 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    Transient alteration of awareness 11/10/2012   Urinary incontinence with continuous leakage 03/08/2014    Past Surgical History:  Procedure Laterality Date   A/V FISTULAGRAM Left 07/21/2016   Procedure: A/V Fistulagram;  Surgeon: Annice Needy, MD;  Location: [facility] CV [facility];  Service: Cardiovascular;  Laterality: Left;   A/V FISTULAGRAM Left 12/24/2016   Procedure: A/V Fistulagram;  Surgeon: Annice Needy, MD;  Location: [facility];  Service:  Cardiovascular;  Laterality: Left;   A/V FISTULAGRAM Left 06/29/2017   Procedure: A/V FISTULAGRAM;  Surgeon: Annice Needy, MD;  Location: [facility];  Service: Cardiovascular;  Laterality: Left;   A/V FISTULAGRAM Left 03/08/2018   Procedure: A/V FISTULAGRAM;  Surgeon: Annice Needy, MD;  Location: [facility];  Service: Cardiovascular;  Laterality: Left;   A/V FISTULAGRAM Left 04/11/2019   Procedure: A/V FISTULAGRAM;  Surgeon: Annice Needy, MD;  Location: [facility];  Service: Cardiovascular;  Laterality: Left;   A/V SHUNT INTERVENTION N/A 07/21/2016   Procedure: A/V Shunt Intervention;  Surgeon: Annice Needy, MD;  Location: [facility];  Service: Cardiovascular;  Laterality: N/A;   A/V SHUNT INTERVENTION N/A 12/24/2016   Procedure: A/V SHUNT INTERVENTION;  Surgeon: Annice Needy, MD;  Location: [facility];  Service: Cardiovascular;  Laterality: N/A;   AVF     BIOPSY  02/23/2023   Procedure: BIOPSY;  Surgeon: Regis Bill, MD;  Location: [facility] ENDOSCOPY;  Service: Endoscopy;;   BREAST BIOPSY Left 06/12/2022   LEFT stereo bx, calcs, "X" clip-path pending   BREAST BIOPSY Left 06/12/2022   MM LT BREAST BX W LOC DEV 1ST LESION IMAGE BX SPEC STEREO GUIDE 06/12/2022 [facility]   CHOLECYSTECTOMY     COLONOSCOPY     COLONOSCOPY WITH PROPOFOL N/A 03/18/2017   Procedure: COLONOSCOPY WITH PROPOFOL;  Surgeon: Scot Jun, MD;  Location: [facility];  Service: Endoscopy;  Laterality: N/A;   COLONOSCOPY WITH PROPOFOL N/A 02/23/2023   Procedure: COLONOSCOPY WITH PROPOFOL;  Surgeon: Regis Bill, MD;  Location: [facility] ENDOSCOPY;  Service: Endoscopy;  Laterality: N/A;  DM & DIALYSIS PATIENT   ENDOMETRIAL BIOPSY     ablation, uterine   ESOPHAGEAL BRUSHING  02/23/2023   Procedure: ESOPHAGEAL BRUSHING;  Surgeon: Regis Bill, MD;  Location: [facility] ENDOSCOPY;  Service: Endoscopy;;   ESOPHAGOGASTRODUODENOSCOPY (EGD) WITH PROPOFOL N/A  03/18/2017   Procedure: ESOPHAGOGASTRODUODENOSCOPY (EGD) WITH PROPOFOL;  Surgeon: Scot Jun, MD;  Location: [facility] ENDOSCOPY;  Service: Endoscopy;  Laterality: N/A;   ESOPHAGOGASTRODUODENOSCOPY (EGD) WITH PROPOFOL N/A 02/23/2023   Procedure: ESOPHAGOGASTRODUODENOSCOPY (EGD) WITH PROPOFOL;  Surgeon: Regis Bill, MD;  Location: [facility] ENDOSCOPY;  Service: Endoscopy;  Laterality: N/A;   HERNIA REPAIR     HERNIA REPAIR     PERIPHERAL VASCULAR CATHETERIZATION N/A 08/23/2015   Procedure: Dialysis/Perma Catheter Insertion;  Surgeon: Annice Needy, MD;  Location: [facility];  Service: Cardiovascular;  Laterality: N/A;   PERIPHERAL VASCULAR CATHETERIZATION N/A 11/09/2015   Procedure: Dialysis/Perma Catheter Removal;  Surgeon: Renford Dills, MD;  Location: [facility];  Service: Cardiovascular;  Laterality: N/A;  PERIPHERAL VASCULAR CATHETERIZATION Left 04/23/2016   Procedure: A/V Fistulagram;  Surgeon: Annice Needy, MD;  Location: [facility];  Service: Cardiovascular;  Laterality: Left;   PORT A CATH REVISION      (Not in a hospital admission)  Social History   Socioeconomic History   Marital status: Single    Spouse name: Not on file   Number of children: 0   Years of education:  college   Highest education level: Not on file  Occupational History   Occupation: not employed  Tobacco Use   Smoking status: Never   Smokeless tobacco: Never  Vaping Use   Vaping status: Never Used  Substance and Sexual Activity   Alcohol use: No   Drug use: No   Sexual activity: Not Currently  Other Topics Concern   Not on file  Social History Narrative   Patient is single and lives with her parents.   Patient is disabled.   Patient has a college education.   Patient is right- handed.   Patient drinks tea occasionally. 2 glasses of tea when they eat out.   Social Drivers of Corporate investment banker Strain: Low Risk  (03/08/2018)   Overall Financial  Resource Strain (CARDIA)    Difficulty of Paying Living Expenses: Not very hard  Food Insecurity: No Food Insecurity (05/25/2023)   Hunger Vital Sign    Worried About Running Out of Food in the Last Year: Never true    Ran Out of Food in the Last Year: Never true  Transportation Needs: No Transportation Needs (05/25/2023)   PRAPARE - Administrator, Civil Service (Medical): No    Lack of Transportation (Non-Medical): No  Physical Activity: Unknown (03/08/2018)   Exercise Vital Sign    Days of Exercise per Week: 0 days    Minutes of Exercise per Session: Not on file  Stress: No Stress Concern Present (03/08/2018)   [employer] of Occupational Health - Occupational Stress Questionnaire    Feeling of Stress : Not at all  Social Connections: Moderately Integrated (05/25/2023)   Social Connection and Isolation Panel [NHANES]    Frequency of Communication with Friends and Family: More than three times a week    Frequency of Social Gatherings with Friends and Family: More than three times a week    Attends Religious Services: More than 4 times per year    Active Member of Clubs or Organizations: Yes    Attends Banker Meetings: 1 to 4 times per year    Marital Status: Divorced  Intimate Partner Violence: Not At Risk (05/25/2023)   Humiliation, Afraid, Rape, and Kick questionnaire    Fear of Current or Ex-Partner: No    Emotionally Abused: No    Physically Abused: No    Sexually Abused: No    Family History  Problem Relation Age of Onset   Diabetes Mother    Lumbar disc disease Mother    Migraines Mother    Hypertension Mother    Cancer - Prostate Father        mild   Diabetes Father    Hypertension Father    Breast cancer Neg Hx    Ovarian cancer Neg Hx    Colon cancer Neg Hx    Heart disease Neg Hx    Kidney cancer Neg Hx    Bladder Cancer Neg Hx      Vitals:   05/25/23 1200 05/25/23 1300 05/25/23 1400 05/25/23 1500  BP: Marland Kitchen)  161/68 (!) 165/61 (!)  179/67 (!) 142/63  Pulse: (!) 59 61 65 (!) 58  Resp: 17 20 20 17   Temp:      TempSrc:      SpO2: 96% 96% 96% 95%  Weight:      Height:        PHYSICAL EXAM General: Chonically ill appearing female, well nourished, in no acute distress. HEENT: Normocephalic and atraumatic. Neck: No JVD.  Lungs: Normal respiratory effort on 4L. Bibasilar crackles. Heart: HRRR. Normal S1 and S2 without gallops or murmurs.  Abdomen: Non-distended appearing.  Msk: Normal strength and tone for age. Extremities: Warm and well perfused. No clubbing, cyanosis. No edema.  Neuro: Alert and oriented X 3. Psych: Answers questions appropriately.   Labs: Basic Metabolic Panel: Recent Labs    05/25/23 0045  NA 139  K 5.2*  CL 97*  CO2 28  GLUCOSE 151*  BUN 57*  CREATININE 7.80*  CALCIUM 8.8*   Liver Function Tests: Recent Labs    05/25/23 0045  AST 17  ALT 18  ALKPHOS 136*  BILITOT 1.0  PROT 6.4*  ALBUMIN 3.8   Recent Labs    05/25/23 0045  LIPASE 37   CBC: Recent Labs    05/25/23 0045  WBC 12.1*  HGB 9.8*  HCT 30.5*  MCV 111.7*  PLT 104*   Cardiac Enzymes: Recent Labs    05/25/23 0045 05/25/23 0158  TROPONINIHS 13 16   BNP: Recent Labs    05/25/23 0045  BNP 846.3*   D-Dimer: No results for input(s): "DDIMER" in the last 72 hours. Hemoglobin A1C: No results for input(s): "HGBA1C" in the last 72 hours. Fasting Lipid Panel: No results for input(s): "CHOL", "HDL", "LDLCALC", "TRIG", "CHOLHDL", "LDLDIRECT" in the last 72 hours. Thyroid Function Tests: No results for input(s): "TSH", "T4TOTAL", "T3FREE", "THYROIDAB" in the last 72 hours.  Invalid input(s): "FREET3" Anemia Panel: No results for input(s): "VITAMINB12", "FOLATE", "FERRITIN", "TIBC", "IRON", "RETICCTPCT" in the last 72 hours.   Radiology: DG Cervical Spine 2 or 3 views Result Date: 05/25/2023 CLINICAL DATA:  Neck pain EXAM: CERVICAL SPINE - 2-3 VIEW COMPARISON:  None Available. FINDINGS: Normal  alignment and prevertebral soft tissues. Multilevel degenerative changes with anterior large osteophytes at C4 through C6. Slight disc space narrowing at C6-7. Intact odontoid. Facets are aligned. No acute osseous finding. IMPRESSION: Cervical spine degenerative changes as above. No acute finding by plain radiography. Electronically Signed   By: Judie Petit.  Shick M.D.   On: 05/25/2023 09:21   CT Angio Chest/Abd/Pel for Dissection W and/or Wo Contrast Result Date: 05/25/2023 CLINICAL DATA:  Chest pain while lying on the couch. Sharp pain traveling up into the back of neck and both arms. EXAM: CT ANGIOGRAPHY CHEST, ABDOMEN AND PELVIS TECHNIQUE: Non-contrast CT of the chest was initially obtained. Multidetector CT imaging through the chest, abdomen and pelvis was performed using the standard protocol during bolus administration of intravenous contrast. Multiplanar reconstructed images and MIPs were obtained and reviewed to evaluate the vascular anatomy. RADIATION DOSE REDUCTION: This exam was performed according to the departmental dose-optimization program which includes automated exposure control, adjustment of the mA and/or kV according to patient size and/or use of iterative reconstruction technique. CONTRAST:  OMNIPAQUE IOHEXOL 350 MG/ML SOLN COMPARISON:  Same day chest radiograph; CTA chest 03/17/2022 and CT 10/23/2021 FINDINGS: CTA CHEST FINDINGS Cardiovascular: Cardiomegaly. No pericardial effusion. Normal caliber thoracic aorta without intramural hematoma, penetrating atherosclerotic ulcer, or dissection. Coronary artery and aortic atherosclerotic calcification. No central  pulmonary embolism. The main pulmonary artery is dilated measuring 34 mm. Mediastinum/Nodes: Multinodular enlarged heterogenous left thyroid lobe. Bowing of the posterior trachea compatible with expiratory phase exam. Esophagus is unremarkable. No thoracic adenopathy. Lungs/Pleura: Diffuse interlobular septal thickening with patchy  ground-glass opacities in the lower lungs. No pleural effusion or pneumothorax. Musculoskeletal: No acute fracture. Review of the MIP images confirms the above findings. CTA ABDOMEN AND PELVIS FINDINGS VASCULAR The aorta and its mesenteric, renal, and iliac artery branches are patent without dissection. 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 No aortic aneurysm. Mixed density atherosclerotic plaque in the mid SMA causes moderate narrowing. Review of the MIP images confirms the above findings. NON-VASCULAR Hepatobiliary: Nodular hepatic contour. Cholecystectomy. No biliary dilation. Pancreas: Unremarkable. Spleen: Borderline splenomegaly measuring 13.4 cm in craniocaudal dimension. Adrenals/Urinary Tract: Left adrenal myolipoma. No follow-up recommended. Normal right adrenal gland. Bilateral cortical renal atrophy. Bilateral calyceal stones and/or parenchymal calcifications. No obstructing ureteral calculi or hydronephrosis. Nondistended bladder. Stomach/Bowel: Normal caliber large and small bowel. Colonic diverticulosis without diverticulitis. Stomach and appendix are within normal limits. Lymphatic: No lymphadenopathy. Reproductive: No acute abnormality. Other: No free intraperitoneal fluid or air.  Body wall edema. Musculoskeletal: No acute fracture. Review of the MIP images confirms the above findings. IMPRESSION: 1. No aortic aneurysm or dissection. 2. Cardiomegaly with pulmonary edema. 3. Dilated main pulmonary artery, which can be seen with pulmonary hypertension. 4. Cirrhosis. 5. Atrophic kidneys with nonobstructing bilateral nephrolithiasis. 6. Multinodular enlarged heterogenous left thyroid lobe. Recommend nonemergent thyroid ultrasound if not previously performed. (Ref: J Am Coll Radiol. 2015 Feb;12(2): 143-50). Aortic Atherosclerosis (ICD10-I70.0). Electronically Signed   By: Minerva Fester M.D.   On: 05/25/2023 02:42   DG Chest Portable 1 View Result Date: 05/25/2023 CLINICAL DATA:  Chest pain  EXAM: PORTABLE CHEST 1 VIEW COMPARISON:  09/05/2022 FINDINGS: Stable cardiomegaly. Loop recorder. Pulmonary vascular congestion and hazy interstitial opacities in the lower lungs. No pleural effusion or pneumothorax. No displaced rib fractures. IMPRESSION: Cardiomegaly with pulmonary edema. Electronically Signed   By: Minerva Fester M.D.   On: 05/25/2023 00:55   CUP PACEART REMOTE DEVICE CHECK Result Date: 05/18/2023 ILR summary report received. Battery status OK. Normal device function. No new symptom, tachy, brady, or pause episodes. No new AF episodes. Monthly summary reports and ROV/PRN LA, CVRS   ECHO pending  TELEMETRY reviewed by me 05/25/2023: sinus rhythm rate 60s  EKG reviewed by me: None for review  Data reviewed by me 05/25/2023: last 24h vitals tele labs imaging I/O ED provider note, admission H&P  Principal Problem:   Acute CHF (congestive heart failure) (HCC) Active Problems:   Spells of speech arrest   Chest pain   Chronic obstructive pulmonary disease (HCC)   Hypothyroidism   Seizure disorder (HCC)   Insulin dependent type 2 diabetes mellitus (HCC)   End-stage renal disease on hemodialysis (HCC)   Anemia of chronic kidney failure, stage 5 (HCC)   Hypertensive emergency    ASSESSMENT AND PLAN:  Nina Johnston is a 63 y.o. female  with a past medical history of hypertension, type 2 diabetes, hyperlipidemia, mild aortic valve stenosis, LVH, OSA on CPAP, bilateral carotid artery stenosis, ESRD on HD (MWF) who presented to the ED on 05/25/2023 for chest pain. Cardiology was consulted for further evaluation.   # Acute on chronic HFrEF # Hypertensive emergency Patient presented with chest pain, found to be significantly hypertensive.  Clinically appeared volume up on exam, BNP elevated at 846.  Troponins trended 13 > 16.  Chest discomfort resolved with BP control.  EF as low as 45% on echo in 2021. -Echo pending. -Can continue IV Lasix 20 mg twice daily however unsure of  utility of this as she reports she does not make urine. -Plan for hemodialysis later today. -Continue irbesartan 75 mg daily, metoprolol succinate 100 mg twice daily. -Monitor and replenish electrolytes for a goal K >4, Mag >2    # ESRD on HD (MWF) Patient with known ESRD on HD. -Plan for HD later today, fluid to be removed during this. -Nephrology following.   This patient's plan of care was discussed and created with Dr. Melton Alar and she is in agreement.  Signed: Gale Journey, PA-C  05/25/2023, 3:32 PM [facility] Cardiology

## 2023-05-25 NOTE — Assessment & Plan Note (Signed)
 Nephrology consulted for continuation of dialysis ?

## 2023-05-25 NOTE — Assessment & Plan Note (Addendum)
Hypertensive emergency Pulmonary hypertension on CTA chest 05/25/2023 Clinically fluid overloaded, BNP elevated to 846, and CTA chest showing cardiomegaly with pulmonary edema Continue Nitropaste and IV Lasix Nephrology aware.  For dialysis this a.m. BP control-continue home valsartan and metoprolol(holding home clonidine patch pending verification) Daily weights with intake and output monitoring Echocardiogram

## 2023-05-25 NOTE — Progress Notes (Signed)
[facility]  ROUNDING NOTE   Subjective:   Nina Johnston is a 63 year old female with past medical conditions including seizure disorder, diabetes, hypertension, bipolar mood disorder, and end-stage renal disease on hemodialysis.  Patient presents to the emergency department with complaints of chest pain and has been admitted under observation for Hypertensive emergency [I16.1]  Patient is known to our practice and receives outpatient dialysis treatments at [facility] on a MWF schedule, supervised by Dr. Cherylann Ratel.  She reports her last treatment completed on Friday.  Denies any missed recent treatments.  Reports pain began late last night.  Described as a sharp pain from her lower back to her neck and arms.  Denies nausea or vomiting.  Labs on ED arrival significant for potassium 5.2, BUN 57, creatinine 7.8 with GFR 5, elevated white count 12.1 with hemoglobin 9.8.  Chest x-ray shows pulmonary edema.  CT angio chest confirms pulmonary edema and concerning for pulmonary hypertension.  We have been consulted to manage dialysis needs during this admission.   Objective:  Vital signs in last 24 hours:  Temp:  [97.5 F (36.4 C)-97.8 F (36.6 C)] 97.8 F (36.6 C) (01/20 0425) Pulse Rate:  [49-68] 61 (01/20 1300) Resp:  [16-30] 20 (01/20 1300) BP: (146-195)/(59-71) 165/61 (01/20 1300) SpO2:  [82 %-100 %] 96 % (01/20 1300) Weight:  [92.7 kg] 92.7 kg (01/20 0036)  Weight change:  Filed Weights   05/25/23 0036  Weight: 92.7 kg    Intake/Output: No intake/output data recorded.   Intake/Output this shift:  No intake/output data recorded.  Physical Exam: General: NAD  Head: Normocephalic, atraumatic. Moist oral mucosal membranes  Eyes: Anicteric  Lungs:  Clear to auscultation, normal effort  Heart: Regular rate and rhythm  Abdomen:  Soft, nontender,   Extremities: No peripheral edema.  Neurologic: Nonfocal, moving all four extremities  Skin: No lesions  Access:  Left aVF    Basic Metabolic Panel: Recent Labs  Lab 05/25/23 0045  NA 139  K 5.2*  CL 97*  CO2 28  GLUCOSE 151*  BUN 57*  CREATININE 7.80*  CALCIUM 8.8*    Liver Function Tests: Recent Labs  Lab 05/25/23 0045  AST 17  ALT 18  ALKPHOS 136*  BILITOT 1.0  PROT 6.4*  ALBUMIN 3.8   Recent Labs  Lab 05/25/23 0045  LIPASE 37   No results for input(s): "AMMONIA" in the last 168 hours.  CBC: Recent Labs  Lab 05/25/23 0045  WBC 12.1*  HGB 9.8*  HCT 30.5*  MCV 111.7*  PLT 104*    Cardiac Enzymes: No results for input(s): "CKTOTAL", "CKMB", "CKMBINDEX", "TROPONINI" in the last 168 hours.  BNP: Invalid input(s): "POCBNP"  CBG: No results for input(s): "GLUCAP" in the last 168 hours.  Microbiology: Results for orders placed or performed during the hospital encounter of 02/23/23  KOH prep     Status: None   Collection Time: 02/23/23  9:00 AM   Specimen: Esophagus  Result Value Ref Range Status   Specimen Description ESOPHAGUS  Final   Special Requests NONE  Final   KOH Prep   Final    FEW YEAST Performed at [facility], [address], [place], [place] [address]    Report Status 02/23/2023 FINAL  Final    Coagulation Studies: No results for input(s): "LABPROT", "INR" in the last 72 hours.  Urinalysis: No results for input(s): "COLORURINE", "LABSPEC", "PHURINE", "GLUCOSEU", "HGBUR", "BILIRUBINUR", "KETONESUR", "PROTEINUR", "UROBILINOGEN", "NITRITE", "LEUKOCYTESUR" in the last 72 hours.  Invalid input(s): "APPERANCEUR"    Imaging: DG Cervical Spine 2 or 3 views Result Date: 05/25/2023 CLINICAL DATA:  Neck pain EXAM: CERVICAL SPINE - 2-3 VIEW COMPARISON:  None Available. FINDINGS: Normal alignment and prevertebral soft tissues. Multilevel degenerative changes with anterior large osteophytes at C4 through C6. Slight disc space narrowing at C6-7. Intact odontoid. Facets are aligned. No acute osseous finding. IMPRESSION: Cervical spine  degenerative changes as above. No acute finding by plain radiography. Electronically Signed   By: Judie Petit.  Shick M.D.   On: 05/25/2023 09:21   CT Angio Chest/Abd/Pel for Dissection W and/or Wo Contrast Result Date: 05/25/2023 CLINICAL DATA:  Chest pain while lying on the couch. Sharp pain traveling up into the back of neck and both arms. EXAM: CT ANGIOGRAPHY CHEST, ABDOMEN AND PELVIS TECHNIQUE: Non-contrast CT of the chest was initially obtained. Multidetector CT imaging through the chest, abdomen and pelvis was performed using the standard protocol during bolus administration of intravenous contrast. Multiplanar reconstructed images and MIPs were obtained and reviewed to evaluate the vascular anatomy. RADIATION DOSE REDUCTION: This exam was performed according to the departmental dose-optimization program which includes automated exposure control, adjustment of the mA and/or kV according to patient size and/or use of iterative reconstruction technique. CONTRAST:  OMNIPAQUE IOHEXOL 350 MG/ML SOLN COMPARISON:  Same day chest radiograph; CTA chest 03/17/2022 and CT 10/23/2021 FINDINGS: CTA CHEST FINDINGS Cardiovascular: Cardiomegaly. No pericardial effusion. Normal caliber thoracic aorta without intramural hematoma, penetrating atherosclerotic ulcer, or dissection. Coronary artery and aortic atherosclerotic calcification. No central pulmonary embolism. The main pulmonary artery is dilated measuring 34 mm. Mediastinum/Nodes: Multinodular enlarged heterogenous left thyroid lobe. Bowing of the posterior trachea compatible with expiratory phase exam. Esophagus is unremarkable. No thoracic adenopathy. Lungs/Pleura: Diffuse interlobular septal thickening with patchy ground-glass opacities in the lower lungs. No pleural effusion or pneumothorax. Musculoskeletal: No acute fracture. Review of the MIP images confirms the above findings. CTA ABDOMEN AND PELVIS FINDINGS VASCULAR The aorta and its mesenteric, renal, and iliac  artery branches are patent without dissection. 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 No aortic aneurysm. Mixed density atherosclerotic plaque in the mid SMA causes moderate narrowing. Review of the MIP images confirms the above findings. NON-VASCULAR Hepatobiliary: Nodular hepatic contour. Cholecystectomy. No biliary dilation. Pancreas: Unremarkable. Spleen: Borderline splenomegaly measuring 13.4 cm in craniocaudal dimension. Adrenals/Urinary Tract: Left adrenal myolipoma. No follow-up recommended. Normal right adrenal gland. Bilateral cortical renal atrophy. Bilateral calyceal stones and/or parenchymal calcifications. No obstructing ureteral calculi or hydronephrosis. Nondistended bladder. Stomach/Bowel: Normal caliber large and small bowel. Colonic diverticulosis without diverticulitis. Stomach and appendix are within normal limits. Lymphatic: No lymphadenopathy. Reproductive: No acute abnormality. Other: No free intraperitoneal fluid or air.  Body wall edema. Musculoskeletal: No acute fracture. Review of the MIP images confirms the above findings. IMPRESSION: 1. No aortic aneurysm or dissection. 2. Cardiomegaly with pulmonary edema. 3. Dilated main pulmonary artery, which can be seen with pulmonary hypertension. 4. Cirrhosis. 5. Atrophic kidneys with nonobstructing bilateral nephrolithiasis. 6. Multinodular enlarged heterogenous left thyroid lobe. Recommend nonemergent thyroid ultrasound if not previously performed. (Ref: J Am Coll Radiol. 2015 Feb;12(2): 143-50). Aortic Atherosclerosis (ICD10-I70.0). Electronically Signed   By: Minerva Fester M.D.   On: 05/25/2023 02:42   DG Chest Portable 1 View Result Date: 05/25/2023 CLINICAL DATA:  Chest pain EXAM: PORTABLE CHEST 1 VIEW COMPARISON:  09/05/2022 FINDINGS: Stable cardiomegaly. Loop recorder. Pulmonary vascular congestion and hazy interstitial opacities in the lower lungs. No pleural effusion or pneumothorax. No displaced rib fractures. IMPRESSION:  Cardiomegaly with pulmonary edema. Electronically Signed   By: Minerva Fester M.D.   On: 05/25/2023 00:55     Medications:     b complex vitamins  1 tablet Oral QHS   Chlorhexidine Gluconate Cloth  6 each Topical Q0600   DIALYVITE TABLET  1 tablet Oral Daily   ethosuximide  250 mg Oral BID   Febuxostat  80 mg Oral Daily   ferric citrate  630 mg Oral TID   furosemide  20 mg Intravenous BID   heparin  5,000 Units Subcutaneous Q8H   insulin aspart  0-5 Units Subcutaneous QHS   insulin aspart  0-6 Units Subcutaneous TID WC   insulin glargine-yfgn  20 Units Subcutaneous Daily   [START ON 05/26/2023] irbesartan  75 mg Oral Daily   irbesartan  75 mg Oral Daily   lamoTRIgine  300 mg Oral BID   levETIRAcetam  750 mg Oral BID   [START ON 05/26/2023] levothyroxine  150 mcg Oral QAC breakfast   [START ON 05/26/2023] metoprolol succinate  100 mg Oral Daily   metoprolol succinate  100 mg Oral BID   acetaminophen **OR** acetaminophen, albuterol, HYDROcodone-acetaminophen, lidocaine (PF), morphine injection, nitroGLYCERIN, ondansetron **OR** ondansetron (ZOFRAN) IV  Assessment/ Plan:  Nina Johnston is a 63 y.o.  female with past medical conditions including seizure disorder, diabetes, hypertension, bipolar mood disorder, and end-stage renal disease on hemodialysis.  Patient presents to the emergency department with complaints of chest pain and has been admitted under observation for Hypertensive emergency [I16.1]   Hyperkalemia with end-stage renal disease on hemodialysis.  Potassium 5.2.  Patient scheduled to receive dialysis today.  Will dialyze on 2K bath for management.  UF goal 1 to 2 L as tolerated.  Next treatment scheduled for Wednesday.  2.  Hypertensive urgency, BNP elevated.  CT angio chest shows cardiomegaly with pulmonary edema.  Patient given IV Lasix and will receive dialysis later today.  3. Anemia of chronic kidney disease Lab Results  Component Value Date   HGB 9.8 (L)  05/25/2023    Hemoglobin within desired range.  Will consider low-dose EPO as needed.  4. Secondary Hyperparathyroidism:  Lab Results  Component Value Date   PTH 77 (H) 08/21/2018   CALCIUM 8.8 (L) 05/25/2023   CAION 1.08 (L) 02/23/2023   PHOS 3.1 08/21/2018    Will continue to monitor bone minerals during this admission.   LOS: 0 Lillyana Majette 1/20/20252:09 PM

## 2023-05-25 NOTE — Discharge Planning (Signed)
ESTABLISHED HEMODIALYSIS Outpatient Facility  [facility]  [address]  [place], [place] [address] [phone]  Schedule: MWF 11:05am  Nina Johnston Dialysis Coordinator II  [facility] Cell: [phone] eFax: [phone] [email or web address]

## 2023-05-25 NOTE — ED Notes (Signed)
Messaged MD regarding pt's request for update on results.

## 2023-05-26 ENCOUNTER — Observation Stay: Admit: 2023-05-26 | Payer: Medicare Other

## 2023-05-26 ENCOUNTER — Observation Stay
Admit: 2023-05-26 | Discharge: 2023-05-26 | Disposition: A | Discharge status: Home / Self Care | Payer: Medicare Other | Attending: Internal Medicine

## 2023-05-26 ENCOUNTER — Observation Stay
Admit: 2023-05-26 | Discharge: 2023-05-26 | Disposition: A | Discharge status: Home / Self Care | Payer: Medicare Other | Attending: Student | Admitting: Student

## 2023-05-26 DIAGNOSIS — I5033 Acute on chronic diastolic (congestive) heart failure: Secondary | ICD-10-CM | POA: Diagnosis not present

## 2023-05-26 DIAGNOSIS — I5031 Acute diastolic (congestive) heart failure: Secondary | ICD-10-CM | POA: Diagnosis not present

## 2023-05-26 LAB — CBC WITH DIFFERENTIAL/PLATELET
Abs Immature Granulocytes: 0.16 10*3/uL — ABNORMAL HIGH (ref 0.00–0.07)
Basophils Absolute: 0.1 10*3/uL (ref 0.0–0.1)
Basophils Relative: 1 %
Eosinophils Absolute: 0.1 10*3/uL (ref 0.0–0.5)
Eosinophils Relative: 1 %
HCT: 30.5 % — ABNORMAL LOW (ref 36.0–46.0)
Hemoglobin: 9.8 g/dL — ABNORMAL LOW (ref 12.0–15.0)
Immature Granulocytes: 1 %
Lymphocytes Relative: 12 %
Lymphs Abs: 1.5 10*3/uL (ref 0.7–4.0)
MCH: 35.6 pg — ABNORMAL HIGH (ref 26.0–34.0)
MCHC: 32.1 g/dL (ref 30.0–36.0)
MCV: 110.9 fL — ABNORMAL HIGH (ref 80.0–100.0)
Monocytes Absolute: 0.5 10*3/uL (ref 0.1–1.0)
Monocytes Relative: 4 %
Neutro Abs: 10 10*3/uL — ABNORMAL HIGH (ref 1.7–7.7)
Neutrophils Relative %: 81 %
Platelets: 102 10*3/uL — ABNORMAL LOW (ref 150–400)
RBC: 2.75 MIL/uL — ABNORMAL LOW (ref 3.87–5.11)
RDW: 14.9 % (ref 11.5–15.5)
WBC: 12.2 10*3/uL — ABNORMAL HIGH (ref 4.0–10.5)
nRBC: 0 % (ref 0.0–0.2)

## 2023-05-26 LAB — BASIC METABOLIC PANEL
Anion gap: 12 (ref 5–15)
BUN: 32 mg/dL — ABNORMAL HIGH (ref 8–23)
CO2: 27 mmol/L (ref 22–32)
Calcium: 8.4 mg/dL — ABNORMAL LOW (ref 8.9–10.3)
Chloride: 96 mmol/L — ABNORMAL LOW (ref 98–111)
Creatinine, Ser: 5.01 mg/dL — ABNORMAL HIGH (ref 0.44–1.00)
GFR, Estimated: 9 mL/min — ABNORMAL LOW (ref >60)
Glucose, Bld: 149 mg/dL — ABNORMAL HIGH (ref 70–99)
Potassium: 5.1 mmol/L (ref 3.5–5.1)
Sodium: 135 mmol/L (ref 135–145)

## 2023-05-26 LAB — ECHOCARDIOGRAM COMPLETE
AR max vel: 1.79 cm2
AV Area VTI: 1.97 cm2
AV Area mean vel: 1.87 cm2
AV Mean grad: 4.5 mm[Hg]
AV Peak grad: 7.7 mm[Hg]
Ao pk vel: 1.39 m/s
Area-P 1/2: 3.79 cm2
Height: 63 in
MV VTI: 1.72 cm2
S' Lateral: 3.8 cm
Weight: 3269.86 [oz_av]

## 2023-05-26 LAB — CBG MONITORING, ED
Glucose-Capillary: 131 mg/dL — ABNORMAL HIGH (ref 70–99)
Glucose-Capillary: 122 mg/dL — ABNORMAL HIGH (ref 70–99)

## 2023-05-26 LAB — HEMOGLOBIN A1C
Hgb A1c MFr Bld: 6.1 % — ABNORMAL HIGH (ref 4.8–5.6)
Mean Plasma Glucose: 128.37 mg/dL

## 2023-05-26 LAB — HEPATITIS B SURFACE ANTIBODY, QUANTITATIVE: Hep B S AB Quant (Post): 138 m[IU]/mL

## 2023-05-26 NOTE — Progress Notes (Signed)
[facility] CLINIC CARDIOLOGY PROGRESS NOTE       Patient ID: Nina Johnston MRN: [id number] DOB/AGE: Oct 05, 1960 63 y.o.  Admit date: 05/25/2023 Referring Physician Dr. Lindajo Royal Primary Physician Sparks, Duane Lope, MD  Primary Cardiologist Minda Ditto, [place] Reason for Consultation AoCHF  HPI: Nina Johnston is a 62 y.o. female  with a past medical history of hypertension, type 2 diabetes, hyperlipidemia, mild aortic valve stenosis, LVH, OSA on CPAP, bilateral carotid artery stenosis, ESRD on HD (MWF) who presented to the ED on 05/25/2023 for chest pain. Cardiology was consulted for further evaluation.   Interval history: -Patient seen and examined this morning, reports she is feeling well today. -States that her breathing is better, she denies any chest pain. -She underwent dialysis yesterday and had 1 L removed. -BP and heart rate remained stable.  Review of systems complete and found to be negative unless listed above    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  On multiple psychotropic medications, and with DM>    Benign essential HTN 12/26/2014   Bipolar disorder (HCC)    Bronchitis    Carotid artery narrowing 12/22/2014   Cervical prolapse    CHF (congestive heart failure) (HCC)    CKD (chronic kidney disease)    Concussion 01/14/2014   Fall    COPD (chronic obstructive pulmonary disease) (HCC)    Coronary artery disease    Depression    Diabetes mellitus without complication (HCC)    Dialysis patient (HCC)    Diplopia    Dupuytren's contracture of foot    Essential (primary) hypertension 02/12/2015   Family history of thyroid problem    GERD (gastroesophageal reflux disease)    Gout    Heart disease    enlarged because of COPD    Heart murmur    Hepatomegaly    HLD (hyperlipidemia)    Hyperactive airway disease    Hyperkalemia    Hypertension    Hypothyroidism    Irritable colon    Morbid obesity (HCC)    Multiple thyroid nodules    OAB (overactive bladder)    Pernicious anemia    Post menopausal syndrome    Post-concussion syndrome 03/08/2014   Post-traumatic brain syndrome    Prolapsed uterus    PSVT (paroxysmal supraventricular tachycardia) (HCC)    Renal calculus    Renal disease    w/ GFR 29-may be due to diabetes   Seizures (HCC)    last seizure 2012. has had spells since with no knowledge. 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    Transient alteration of awareness 11/10/2012   Urinary incontinence with continuous leakage 03/08/2014    Past Surgical History:  Procedure Laterality Date   A/V FISTULAGRAM Left 07/21/2016   Procedure: A/V Fistulagram;  Surgeon: Annice Needy, MD;  Location: [facility];  Service: Cardiovascular;  Laterality: Left;   A/V FISTULAGRAM Left 12/24/2016   Procedure: A/V Fistulagram;  Surgeon: Annice Needy, MD;  Location: [facility];  Service: Cardiovascular;  Laterality: Left;   A/V FISTULAGRAM Left 06/29/2017  Procedure: A/V FISTULAGRAM;  Surgeon: Annice Needy, MD;  Location: [facility];  Service: Cardiovascular;  Laterality: Left;   A/V FISTULAGRAM Left 03/08/2018   Procedure: A/V FISTULAGRAM;  Surgeon: Annice Needy, MD;  Location: [facility];  Service: Cardiovascular;  Laterality: Left;   A/V FISTULAGRAM Left 04/11/2019   Procedure: A/V FISTULAGRAM;  Surgeon: Annice Needy, MD;  Location: [facility];  Service: Cardiovascular;  Laterality: Left;   A/V SHUNT INTERVENTION N/A 07/21/2016   Procedure: A/V Shunt Intervention;  Surgeon: Annice Needy, MD;  Location: [facility];  Service: Cardiovascular;  Laterality: N/A;   A/V SHUNT INTERVENTION  N/A 12/24/2016   Procedure: A/V SHUNT INTERVENTION;  Surgeon: Annice Needy, MD;  Location: [facility];  Service: Cardiovascular;  Laterality: N/A;   AVF     BIOPSY  02/23/2023   Procedure: BIOPSY;  Surgeon: Regis Bill, MD;  Location: [facility]OSCOPY;  Service: Endoscopy;;   BREAST BIOPSY Left 06/12/2022   LEFT stereo bx, calcs, "X" clip-path pending   BREAST BIOPSY Left 06/12/2022   MM LT BREAST BX W LOC DEV 1ST LESION IMAGE BX SPEC STEREO GUIDE 06/12/2022 [facility]   CHOLECYSTECTOMY     COLONOSCOPY     COLONOSCOPY WITH PROPOFOL N/A 03/18/2017   Procedure: COLONOSCOPY WITH PROPOFOL;  Surgeon: Scot Jun, MD;  Location: [facility] ENDOSCOPY;  Service: Endoscopy;  Laterality: N/A;   COLONOSCOPY WITH PROPOFOL N/A 02/23/2023   Procedure: COLONOSCOPY WITH PROPOFOL;  Surgeon: Regis Bill, MD;  Location: [facility] ENDOSCOPY;  Service: Endoscopy;  Laterality: N/A;  DM & DIALYSIS PATIENT   ENDOMETRIAL BIOPSY     ablation, uterine   ESOPHAGEAL BRUSHING  02/23/2023   Procedure: ESOPHAGEAL BRUSHING;  Surgeon: Regis Bill, MD;  Location: [facility] ENDOSCOPY;  Service: Endoscopy;;   ESOPHAGOGASTRODUODENOSCOPY (EGD) WITH PROPOFOL N/A 03/18/2017   Procedure: ESOPHAGOGASTRODUODENOSCOPY (EGD) WITH PROPOFOL;  Surgeon: Scot Jun, MD;  Location: [facility] ENDOSCOPY;  Service: Endoscopy;  Laterality: N/A;   ESOPHAGOGASTRODUODENOSCOPY (EGD) WITH PROPOFOL N/A 02/23/2023   Procedure: ESOPHAGOGASTRODUODENOSCOPY (EGD) WITH PROPOFOL;  Surgeon: Regis Bill, MD;  Location: [facility] ENDOSCOPY;  Service: Endoscopy;  Laterality: N/A;   HERNIA REPAIR     HERNIA REPAIR     PERIPHERAL VASCULAR CATHETERIZATION N/A 08/23/2015   Procedure: Dialysis/Perma Catheter Insertion;  Surgeon: Annice Needy, MD;  Location: [facility];  Service: Cardiovascular;  Laterality: N/A;   PERIPHERAL VASCULAR CATHETERIZATION N/A 11/09/2015   Procedure: Dialysis/Perma Catheter Removal;  Surgeon: Renford Dills, MD;  Location: [facility];  Service: Cardiovascular;  Laterality: N/A;   PERIPHERAL VASCULAR CATHETERIZATION Left 04/23/2016   Procedure: A/V Fistulagram;  Surgeon: Annice Needy, MD;  Location: [facility];  Service: Cardiovascular;  Laterality: Left;   PORT A CATH REVISION      (Not in a hospital admission)  Social History   Socioeconomic History   Marital status: Single    Spouse name: Not on file   Number of children: 0   Years of education:  college   Highest education level: Not on file  Occupational History   Occupation: not employed  Tobacco Use   Smoking status: Never   Smokeless tobacco: Never  Vaping Use   Vaping status: Never Used  Substance and Sexual Activity   Alcohol use: No   Drug use: No   Sexual activity: Not Currently  Other Topics Concern   Not on file  Social History Narrative   Patient is single and lives with her parents.   Patient is disabled.   Patient has a college education.   Patient is right- handed.   Patient drinks tea occasionally. 2 glasses of tea when they eat out.   Social Drivers of Corporate investment banker Strain: Low Risk  (03/08/2018)   Overall Financial Resource Strain (CARDIA)    Difficulty of Paying Living Expenses: Not very hard  Food Insecurity: No Food Insecurity (05/25/2023)   Hunger Vital Sign    Worried About Running Out of Food in the Last Year: Never true    Ran Out of Food in the Last Year: Never true  Transportation Needs: No Transportation Needs (05/25/2023)   PRAPARE - Administrator, Civil Service (Medical): No    Lack of Transportation (Non-Medical): No  Physical Activity: Unknown (03/08/2018)   Exercise Vital Sign    Days of Exercise per Week: 0 days    Minutes of Exercise per Session: Not on file  Stress: No Stress Concern Present (03/08/2018)   [employer] of Occupational Health - Occupational Stress Questionnaire    Feeling of Stress : Not at all  Social  Connections: Moderately Integrated (05/25/2023)   Social Connection and Isolation Panel [NHANES]    Frequency of Communication with Friends and Family: More than three times a week    Frequency of Social Gatherings with Friends and Family: More than three times a week    Attends Religious Services: More than 4 times per year    Active Member of Clubs or Organizations: Yes    Attends Banker Meetings: 1 to 4 times per year    Marital Status: Divorced  Intimate Partner Violence: Not At Risk (05/25/2023)   Humiliation, Afraid, Rape, and Kick questionnaire    Fear of Current or Ex-Partner: No    Emotionally Abused: No    Physically Abused: No    Sexually Abused: No    Family History  Problem Relation Age of Onset   Diabetes Mother    Lumbar disc disease Mother    Migraines Mother    Hypertension Mother    Cancer - Prostate Father        mild   Diabetes Father    Hypertension Father    Breast cancer Neg Hx    Ovarian cancer Neg Hx    Colon cancer Neg Hx    Heart disease Neg Hx    Kidney cancer Neg Hx    Bladder Cancer Neg Hx      Vitals:   05/26/23 0530 05/26/23 0630 05/26/23 0900 05/26/23 0930  BP: (!) 163/63 (!) 156/66 (!) 177/61 (!) 157/62  Pulse: (!) 57 (!) 58 (!) 59   Resp: 15 15 19    Temp:      TempSrc:      SpO2: 100% 97% 100%   Weight:      Height:        PHYSICAL EXAM General: Chonically ill appearing female, well nourished, in no acute distress. HEENT: Normocephalic and atraumatic. Neck: No JVD.  Lungs: Normal respiratory effort on 2L.  CTAB. Heart: HRRR. Normal S1 and S2 without gallops or murmurs.  Abdomen: Non-distended appearing.  Msk: Normal strength and tone for age. Extremities: Warm and well perfused. No clubbing, cyanosis. No edema.  Neuro: Alert and oriented X 3. Psych: Answers questions appropriately.   Labs: Basic Metabolic Panel: Recent Labs    05/25/23 0045 05/26/23 0442  NA 139  135  K 5.2* 5.1  CL 97* 96*  CO2 28 27   GLUCOSE 151* 149*  BUN 57* 32*  CREATININE 7.80* 5.01*  CALCIUM 8.8* 8.4*   Liver Function Tests: Recent Labs    05/25/23 0045  AST 17  ALT 18  ALKPHOS 136*  BILITOT 1.0  PROT 6.4*  ALBUMIN 3.8   Recent Labs    05/25/23 0045  LIPASE 37   CBC: Recent Labs    05/25/23 0045 05/26/23 0442  WBC 12.1* 12.2*  NEUTROABS  --  10.0*  HGB 9.8* 9.8*  HCT 30.5* 30.5*  MCV 111.7* 110.9*  PLT 104* 102*   Cardiac Enzymes: Recent Labs    05/25/23 0045 05/25/23 0158  TROPONINIHS 13 16   BNP: Recent Labs    05/25/23 0045  BNP 846.3*   D-Dimer: No results for input(s): "DDIMER" in the last 72 hours. Hemoglobin A1C: No results for input(s): "HGBA1C" in the last 72 hours. Fasting Lipid Panel: No results for input(s): "CHOL", "HDL", "LDLCALC", "TRIG", "CHOLHDL", "LDLDIRECT" in the last 72 hours. Thyroid Function Tests: No results for input(s): "TSH", "T4TOTAL", "T3FREE", "THYROIDAB" in the last 72 hours.  Invalid input(s): "FREET3" Anemia Panel: No results for input(s): "VITAMINB12", "FOLATE", "FERRITIN", "TIBC", "IRON", "RETICCTPCT" in the last 72 hours.   Radiology: DG Cervical Spine 2 or 3 views Result Date: 05/25/2023 CLINICAL DATA:  Neck pain EXAM: CERVICAL SPINE - 2-3 VIEW COMPARISON:  None Available. FINDINGS: Normal alignment and prevertebral soft tissues. Multilevel degenerative changes with anterior large osteophytes at C4 through C6. Slight disc space narrowing at C6-7. Intact odontoid. Facets are aligned. No acute osseous finding. IMPRESSION: Cervical spine degenerative changes as above. No acute finding by plain radiography. Electronically Signed   By: Judie Petit.  Shick M.D.   On: 05/25/2023 09:21   CT Angio Chest/Abd/Pel for Dissection W and/or Wo Contrast Result Date: 05/25/2023 CLINICAL DATA:  Chest pain while lying on the couch. Sharp pain traveling up into the back of neck and both arms. EXAM: CT ANGIOGRAPHY CHEST, ABDOMEN AND PELVIS TECHNIQUE: Non-contrast CT of  the chest was initially obtained. Multidetector CT imaging through the chest, abdomen and pelvis was performed using the standard protocol during bolus administration of intravenous contrast. Multiplanar reconstructed images and MIPs were obtained and reviewed to evaluate the vascular anatomy. RADIATION DOSE REDUCTION: This exam was performed according to the departmental dose-optimization program which includes automated exposure control, adjustment of the mA and/or kV according to patient size and/or use of iterative reconstruction technique. CONTRAST:  OMNIPAQUE IOHEXOL 350 MG/ML SOLN COMPARISON:  Same day chest radiograph; CTA chest 03/17/2022 and CT 10/23/2021 FINDINGS: CTA CHEST FINDINGS Cardiovascular: Cardiomegaly. No pericardial effusion. Normal caliber thoracic aorta without intramural hematoma, penetrating atherosclerotic ulcer, or dissection. Coronary artery and aortic atherosclerotic calcification. No central pulmonary embolism. The main pulmonary artery is dilated measuring 34 mm. Mediastinum/Nodes: Multinodular enlarged heterogenous left thyroid lobe. Bowing of the posterior trachea compatible with expiratory phase exam. Esophagus is unremarkable. No thoracic adenopathy. Lungs/Pleura: Diffuse interlobular septal thickening with patchy ground-glass opacities in the lower lungs. No pleural effusion or pneumothorax. Musculoskeletal: No acute fracture. Review of the MIP images confirms the above findings. CTA ABDOMEN AND PELVIS FINDINGS VASCULAR The aorta and its mesenteric, renal, and iliac artery branches are patent without dissection. 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 No aortic aneurysm. Mixed density atherosclerotic plaque in the mid SMA causes moderate narrowing. Review of the MIP images confirms the above findings. NON-VASCULAR Hepatobiliary: Nodular hepatic contour. Cholecystectomy.  No biliary dilation. Pancreas: Unremarkable. Spleen: Borderline splenomegaly measuring 13.4 cm in  craniocaudal dimension. Adrenals/Urinary Tract: Left adrenal myolipoma. No follow-up recommended. Normal right adrenal gland. Bilateral cortical renal atrophy. Bilateral calyceal stones and/or parenchymal calcifications. No obstructing ureteral calculi or hydronephrosis. Nondistended bladder. Stomach/Bowel: Normal caliber large and small bowel. Colonic diverticulosis without diverticulitis. Stomach and appendix are within normal limits. Lymphatic: No lymphadenopathy. Reproductive: No acute abnormality. Other: No free intraperitoneal fluid or air.  Body wall edema. Musculoskeletal: No acute fracture. Review of the MIP images confirms the above findings. IMPRESSION: 1. No aortic aneurysm or dissection. 2. Cardiomegaly with pulmonary edema. 3. Dilated main pulmonary artery, which can be seen with pulmonary hypertension. 4. Cirrhosis. 5. Atrophic kidneys with nonobstructing bilateral nephrolithiasis. 6. Multinodular enlarged heterogenous left thyroid lobe. Recommend nonemergent thyroid ultrasound if not previously performed. (Ref: J Am Coll Radiol. 2015 Feb;12(2): 143-50). Aortic Atherosclerosis (ICD10-I70.0). Electronically Signed   By: Minerva Fester M.D.   On: 05/25/2023 02:42   DG Chest Portable 1 View Result Date: 05/25/2023 CLINICAL DATA:  Chest pain EXAM: PORTABLE CHEST 1 VIEW COMPARISON:  09/05/2022 FINDINGS: Stable cardiomegaly. Loop recorder. Pulmonary vascular congestion and hazy interstitial opacities in the lower lungs. No pleural effusion or pneumothorax. No displaced rib fractures. IMPRESSION: Cardiomegaly with pulmonary edema. Electronically Signed   By: Minerva Fester M.D.   On: 05/25/2023 00:55   CUP PACEART REMOTE DEVICE CHECK Result Date: 05/18/2023 ILR summary report received. Battery status OK. Normal device function. No new symptom, tachy, brady, or pause episodes. No new AF episodes. Monthly summary reports and ROV/PRN LA, CVRS   ECHO pending  TELEMETRY reviewed by me 05/26/2023:  sinus rhythm rate 60s  EKG reviewed by me: None for review  Data reviewed by me 05/26/2023: last 24h vitals tele labs imaging I/O hospitalist progress note Principal Problem:   Acute CHF (congestive heart failure) (HCC) Active Problems:   Spells of speech arrest   Chest pain   Chronic obstructive pulmonary disease (HCC)   Hypothyroidism   Seizure disorder (HCC)   Insulin dependent type 2 diabetes mellitus (HCC)   End-stage renal disease on hemodialysis (HCC)   Anemia of chronic kidney failure, stage 5 (HCC)   Hypertensive emergency    ASSESSMENT AND PLAN:  Nina Johnston is a 63 y.o. female  with a past medical history of hypertension, type 2 diabetes, hyperlipidemia, mild aortic valve stenosis, LVH, OSA on CPAP, bilateral carotid artery stenosis, ESRD on HD (MWF) who presented to the ED on 05/25/2023 for chest pain. Cardiology was consulted for further evaluation.   # Acute on chronic HFrEF # Hypertensive emergency Patient presented with chest pain, found to be significantly hypertensive.  Clinically appeared volume up on exam, BNP elevated at 846.  Troponins trended 13 > 16.  Chest discomfort resolved with BP control.  EF as low as 45% on echo in 2021. -Echo pending. -Continue irbesartan 75 mg daily, metoprolol succinate 100 mg twice daily. -Monitor and replenish electrolytes for a goal K >4, Mag >2    # ESRD on HD (MWF) Patient with known ESRD on HD. -1 L removed during hemodialysis yesterday. -Nephrology following.  Likely stable for discharge pending results of echo today.  This patient's plan of care was discussed and created with Dr. Melton Alar and she is in agreement.  Signed: Gale Journey, PA-C  05/26/2023, 10:35 AM [facility] Cardiology

## 2023-05-26 NOTE — Care Management Obs Status (Signed)
MEDICARE OBSERVATION STATUS NOTIFICATION   Patient Details  Name: Nina Johnston MRN: [id number] Date of Birth: 08-Aug-1960   Medicare Observation Status Notification Given:  Yes    Sherilyn Banker 05/26/2023, 10:57 AM

## 2023-05-26 NOTE — Discharge Summary (Addendum)
Physician Discharge Summary   Patient: Nina Johnston MRN: [id number] DOB: [birth year]  Admit date:     05/25/2023  Discharge date: 05/26/23  Discharge Physician: Lurene Shadow   PCP: Marguarite Arbour, MD   Recommendations at discharge:   Go to the outpatient hemodialysis center in [facility] for hemodialysis on 05/27/2023 Follow-up with PCP in 1 week  Discharge Diagnoses: Principal Problem:   Acute CHF (congestive heart failure) (HCC) Active Problems:   Hypertensive emergency   Chest pain   End-stage renal disease on hemodialysis (HCC)   Spells of speech arrest   Chronic obstructive pulmonary disease (HCC)   Hypothyroidism   Seizure disorder (HCC)   Insulin dependent type 2 diabetes mellitus (HCC)   Anemia of chronic kidney failure, stage 5 (HCC)  Resolved Problems:   * No resolved hospital problems. *  Hospital Course:  Nina Johnston is a 63 y.o. female medical history significant for ESRD on HD, seizure disorder on AEDs, DM, HTN, bipolar mood disorder, who presented to the hospital with chest pain that appears to radiate to the left arm and down to the abdomen.  She also complained of neck pain, left arm pain and sometimes numbness in her hands when she uses her Ipad.  She took an aspirin prior to admission.  She had a negative stress test in December 2023.      Assessment and Plan:   Chest pain, neck pain, left arm pain and numbness: Troponins negative.  Suspect this is musculoskeletal in origin. X-ray of the cervical spine ordered showed degenerative disease of the cervical spine. She had a normal Lexiscan nuclear stress test in December 2023     Acute on chronic HFpEF: She had hemodialysis yesterday.  She feels much better.   BNP 846.3. 2D echo showed EF estimated at 55 to 60%, grade 2 diastolic dysfunction, mild MR   Hypertensive emergency: BP is better.  Continue antihypertensives     ESRD: She had hemodialysis yesterday.  Next dialysis is scheduled  for tomorrow.  She can have hemodialysis as an outpatient.   Liver cirrhosis: Patient said she is already aware of this diagnosis.  Outpatient follow-up with PCP or gastroenterologist.   COPD: Continue bronchodilators Acute on chronic hypoxemic respiratory failure: She uses 2 L/min oxygen at home but only during the night. Oxygen saturation went down to 85% with activity on room air. Patient has been advised to use 2 L of oxygen all the time.  She will continue with CPAP for OSA at night.    Comorbidities include insulin-dependent diabetes mellitus, bipolar disorder, COPD, seizure disorder (on Keppra, Lamictal, Zarontin and Klonopin), hypothyroidism, gout   I went back to discuss 2D echo results with her.  Her condition has improved.  She feels back to baseline and wants to go home today.  She will go for hemodialysis at the outpatient dialysis center tomorrow. Discharge plan was discussed with Dayton Scrape, her brother, at the bedside      Consultants: Cardiologist, nephrologist Procedures performed: None Disposition: Home Diet recommendation:  Discharge Diet Orders (From admission, onward)     Start     Ordered   05/26/23 0000  Diet renal/carb modified with fluid restriction        05/26/23 1540           Renal diet DISCHARGE MEDICATION: Allergies as of 05/26/2023       Reactions   Cinnamon Other (See Comments)   Raises blood pressure   Procrit [epoetin Alfa-epbx] Other (See  Comments)   Seizures, grand mal   Ace Inhibitors Cough   Azithromycin Other (See Comments)   seizures   Iron Other (See Comments)   Can not take in IV Form - causes seizures    Neomycin-bacitracin Zn-polymyx Other (See Comments)   Unknown   Other    Sulfa Antibiotics Other (See Comments)   Due to risks for seizures.        Medication List     STOP taking these medications    Fluticasone-Salmeterol 250-50 MCG/DOSE Aepb Commonly known as: ADVAIR       TAKE these medications     Align 4 MG Caps Take 4 mg by mouth daily.   aspirin EC 81 MG tablet Take 81 mg by mouth every other day. Swallow whole.   Auryxia 1 GM 210 MG(Fe) tablet Generic drug: ferric citrate Take 630 mg by mouth 3 (three) times daily.   clonazePAM 1 MG tablet Commonly known as: KLONOPIN TAKE 1-2 TABLETS (1-2 MG TOTAL) BY MOUTH DAILY.   cloNIDine 0.2 mg/24hr patch Commonly known as: CATAPRES - Dosed in mg/24 hr Place 0.2 mg onto the skin once a week.   Dexcom G6 Sensor Misc Use 1 every 10 days   Dexcom G6 Transmitter Misc USE EVERY 3 MONTHS   DIALYVITE TABLET Tabs Take 1 tablet by mouth daily.   ethosuximide 250 MG/5ML solution Commonly known as: ZARONTIN TAKE 7. 5 ML BY MOUTH TWICE A DAY TAKE AFTER HD TREATMENT ON DIALYSIS DAY.   famotidine 40 MG tablet Commonly known as: PEPCID SMARTSIG:1 Tablet(s) By Mouth Every Evening   Febuxostat 80 MG Tabs Take 80 mg by mouth daily.   fexofenadine 180 MG tablet Commonly known as: ALLEGRA Take 180 mg by mouth daily.   furosemide 40 MG tablet Commonly known as: LASIX Take 60 mg by mouth 2 (two) times daily. TAKE ONE AND ONE-HALF TABLETS TWICE DAILY   Glucagon Emergency 1 MG Kit INJECT 1 MG INTO THE MUSCLE AS DIRECTED   insulin glargine 100 UNIT/ML injection Commonly known as: LANTUS Inject 16 Units into the skin at bedtime. 16 units on Monday,Wednesday,Friday, 12 units on dialysis days   insulin lispro 100 UNIT/ML injection Commonly known as: HUMALOG Inject 8-15 Units into the skin 3 (three) times daily with meals. Per sliding scale   LaMICtal 200 MG tablet Generic drug: lamoTRIgine TAKE 1.5 TABLETS (300 MG TOTAL) BY MOUTH 2 (TWO) TIMES DAILY. GLAXO BRAND NAME   levETIRAcetam 250 MG tablet Commonly known as: Keppra Take Bid on non -dialysis days, 3 a day on hemodialysis days.   levothyroxine 150 MCG tablet Commonly known as: SYNTHROID Take 150 mcg by mouth daily before breakfast.   metoprolol succinate 100 MG 24 hr  tablet Commonly known as: Toprol XL Take 1 tablet (100 mg total) by mouth in the morning and at bedtime. Take with or immediately following a meal.   MIRCERA IJ Inject as directed. Receives w/ dialysis, unsure how often right now   montelukast 10 MG tablet Commonly known as: SINGULAIR SMARTSIG:1 Tablet(s) By Mouth Every Evening   pantoprazole 40 MG tablet Commonly known as: PROTONIX Take 40 mg by mouth every morning.   Unifine Pentips 32G X 4 MM Misc Generic drug: Insulin Pen Needle USE UP TO 5 TIMES DAILY AS DIRECTED   valsartan 80 MG tablet Commonly known as: Diovan Take 1 tablet (80 mg total) by mouth daily.        Follow-up Information     Gerlene Fee,  PA-C. Go in 1 week(s).   Specialty: Cardiology Contact information: [address] [place] [address] [phone]                Discharge Exam: Ceasar Mons Weights   05/25/23 0036 05/25/23 1650  Weight: 92.7 kg 92.7 kg   GEN: NAD SKIN: Warm and dry EYES: No pallor or icterus ENT: MMM CV: RRR PULM: Bibasilar rales, no wheezing ABD: soft, ND, NT, +BS CNS: AAO x 3, non focal EXT: No leg edema    Condition at discharge: good  The results of significant diagnostics from this hospitalization (including imaging, microbiology, ancillary and laboratory) are listed below for reference.   Imaging Studies: ECHOCARDIOGRAM COMPLETE Result Date: 05/26/2023    ECHOCARDIOGRAM REPORT   Patient Name:   MARIALENA ADAMEK Nissen Date of Exam: 05/26/2023 Medical Rec #:  [id number]        Height:       63.0 in Accession #:    [id number]       Weight:       204.4 lb Date of Birth:  1960/12/29        BSA:          1.951 m Patient Age:    62 years         BP:           157/62 mmHg Patient Gender: F                HR:           59 bpm. Exam Location:  [facility] Procedure: 2D Echo, Cardiac Doppler and Color Doppler Indications:     CHF-acute diastolic I50.31  History:         Patient has no prior history of Echocardiogram  examinations.  Sonographer:     Cristela Blue Referring Phys:  [id number] CARALYN HUDSON Diagnosing Phys: Rozell Searing Custovic IMPRESSIONS  1. Left ventricular ejection fraction, by estimation, is 55 to 60%. The left ventricle has normal function. The left ventricle has no regional wall motion abnormalities. Left ventricular diastolic parameters are consistent with Grade II diastolic dysfunction (pseudonormalization).  2. Right ventricular systolic function is normal. The right ventricular size is normal. There is normal pulmonary artery systolic pressure. The estimated right ventricular systolic pressure is 20.7 mmHg.  3. Left atrial size was mildly dilated.  4. The mitral valve is grossly normal. Mild mitral valve regurgitation.  5. The aortic valve is normal in structure. Aortic valve regurgitation is not visualized. No aortic stenosis is present. FINDINGS  Left Ventricle: Left ventricular ejection fraction, by estimation, is 55 to 60%. The left ventricle has normal function. The left ventricle has no regional wall motion abnormalities. The left ventricular internal cavity size was normal in size. There is  no left ventricular hypertrophy. Left ventricular diastolic parameters are consistent with Grade II diastolic dysfunction (pseudonormalization). Right Ventricle: The right ventricular size is normal. No increase in right ventricular wall thickness. Right ventricular systolic function is normal. There is normal pulmonary artery systolic pressure. The tricuspid regurgitant velocity is 1.78 m/s, and  with an assumed right atrial pressure of 8 mmHg, the estimated right ventricular systolic pressure is 20.7 mmHg. Left Atrium: Left atrial size was mildly dilated. Right Atrium: Right atrial size was normal in size. Pericardium: There is no evidence of pericardial effusion. Mitral Valve: The mitral valve is grossly normal. Mild mitral valve regurgitation. MV peak gradient, 6.7 mmHg. The mean mitral valve gradient is 3.0 mmHg.  Tricuspid Valve: The  tricuspid valve is grossly normal. Tricuspid valve regurgitation is trivial. Aortic Valve: The aortic valve is normal in structure. Aortic valve regurgitation is not visualized. No aortic stenosis is present. Aortic valve mean gradient measures 4.5 mmHg. Aortic valve peak gradient measures 7.7 mmHg. Aortic valve area, by VTI measures 1.97 cm. Pulmonic Valve: The pulmonic valve was grossly normal. Pulmonic valve regurgitation is not visualized. Aorta: The aortic root is normal in size and structure. IAS/Shunts: No atrial level shunt detected by color flow Doppler.  LEFT VENTRICLE PLAX 2D LVIDd:         5.50 cm   Diastology LVIDs:         3.80 cm   LV e' medial:    5.44 cm/s LV PW:         1.60 cm   LV E/e' medial:  22.6 LV IVS:        1.30 cm   LV e' lateral:   9.03 cm/s LVOT diam:     2.10 cm   LV E/e' lateral: 13.6 LV SV:         69 LV SV Index:   35 LVOT Area:     3.46 cm  RIGHT VENTRICLE RV Basal diam:  3.80 cm RV Mid diam:    3.10 cm RV S prime:     10.40 cm/s TAPSE (M-mode): 1.7 cm LEFT ATRIUM           Index        RIGHT ATRIUM           Index LA diam:      3.30 cm 1.69 cm/m   RA Area:     15.00 cm LA Vol (A2C): 62.2 ml 31.87 ml/m  RA Volume:   33.60 ml  17.22 ml/m LA Vol (A4C): 63.3 ml 32.44 ml/m  AORTIC VALVE AV Area (Vmax):    1.79 cm AV Area (Vmean):   1.87 cm AV Area (VTI):     1.97 cm AV Vmax:           139.00 cm/s AV Vmean:          92.300 cm/s AV VTI:            0.349 m AV Peak Grad:      7.7 mmHg AV Mean Grad:      4.5 mmHg LVOT Vmax:         71.90 cm/s LVOT Vmean:        49.900 cm/s LVOT VTI:          0.199 m LVOT/AV VTI ratio: 0.57  AORTA Ao Root diam: 2.90 cm MITRAL VALVE                TRICUSPID VALVE MV Area (PHT): 3.79 cm     TR Peak grad:   12.7 mmHg MV Area VTI:   1.72 cm     TR Vmax:        178.00 cm/s MV Peak grad:  6.7 mmHg MV Mean grad:  3.0 mmHg     SHUNTS MV Vmax:       1.29 m/s     Systemic VTI:  0.20 m MV Vmean:      82.4 cm/s    Systemic Diam: 2.10 cm  MV Decel Time: 200 msec MV E velocity: 123.00 cm/s MV A velocity: 99.60 cm/s MV E/A ratio:  1.23 Sabina Custovic Electronically signed by Clotilde Dieter Signature Date/Time: 05/26/2023/3:02:53 PM    Final    DG Cervical Spine 2  or 3 views Result Date: 05/25/2023 CLINICAL DATA:  Neck pain EXAM: CERVICAL SPINE - 2-3 VIEW COMPARISON:  None Available. FINDINGS: Normal alignment and prevertebral soft tissues. Multilevel degenerative changes with anterior large osteophytes at C4 through C6. Slight disc space narrowing at C6-7. Intact odontoid. Facets are aligned. No acute osseous finding. IMPRESSION: Cervical spine degenerative changes as above. No acute finding by plain radiography. Electronically Signed   By: Judie Petit.  Shick M.D.   On: 05/25/2023 09:21   CT Angio Chest/Abd/Pel for Dissection W and/or Wo Contrast Result Date: 05/25/2023 CLINICAL DATA:  Chest pain while lying on the couch. Sharp pain traveling up into the back of neck and both arms. EXAM: CT ANGIOGRAPHY CHEST, ABDOMEN AND PELVIS TECHNIQUE: Non-contrast CT of the chest was initially obtained. Multidetector CT imaging through the chest, abdomen and pelvis was performed using the standard protocol during bolus administration of intravenous contrast. Multiplanar reconstructed images and MIPs were obtained and reviewed to evaluate the vascular anatomy. RADIATION DOSE REDUCTION: This exam was performed according to the departmental dose-optimization program which includes automated exposure control, adjustment of the mA and/or kV according to patient size and/or use of iterative reconstruction technique. CONTRAST:  OMNIPAQUE IOHEXOL 350 MG/ML SOLN COMPARISON:  Same day chest radiograph; CTA chest 03/17/2022 and CT 10/23/2021 FINDINGS: CTA CHEST FINDINGS Cardiovascular: Cardiomegaly. No pericardial effusion. Normal caliber thoracic aorta without intramural hematoma, penetrating atherosclerotic ulcer, or dissection. Coronary artery and aortic  atherosclerotic calcification. No central pulmonary embolism. The main pulmonary artery is dilated measuring 34 mm. Mediastinum/Nodes: Multinodular enlarged heterogenous left thyroid lobe. Bowing of the posterior trachea compatible with expiratory phase exam. Esophagus is unremarkable. No thoracic adenopathy. Lungs/Pleura: Diffuse interlobular septal thickening with patchy ground-glass opacities in the lower lungs. No pleural effusion or pneumothorax. Musculoskeletal: No acute fracture. Review of the MIP images confirms the above findings. CTA ABDOMEN AND PELVIS FINDINGS VASCULAR The aorta and its mesenteric, renal, and iliac artery branches are patent without dissection. 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 No aortic aneurysm. Mixed density atherosclerotic plaque in the mid SMA causes moderate narrowing. Review of the MIP images confirms the above findings. NON-VASCULAR Hepatobiliary: Nodular hepatic contour. Cholecystectomy. No biliary dilation. Pancreas: Unremarkable. Spleen: Borderline splenomegaly measuring 13.4 cm in craniocaudal dimension. Adrenals/Urinary Tract: Left adrenal myolipoma. No follow-up recommended. Normal right adrenal gland. Bilateral cortical renal atrophy. Bilateral calyceal stones and/or parenchymal calcifications. No obstructing ureteral calculi or hydronephrosis. Nondistended bladder. Stomach/Bowel: Normal caliber large and small bowel. Colonic diverticulosis without diverticulitis. Stomach and appendix are within normal limits. Lymphatic: No lymphadenopathy. Reproductive: No acute abnormality. Other: No free intraperitoneal fluid or air.  Body wall edema. Musculoskeletal: No acute fracture. Review of the MIP images confirms the above findings. IMPRESSION: 1. No aortic aneurysm or dissection. 2. Cardiomegaly with pulmonary edema. 3. Dilated main pulmonary artery, which can be seen with pulmonary hypertension. 4. Cirrhosis. 5. Atrophic kidneys with nonobstructing bilateral  nephrolithiasis. 6. Multinodular enlarged heterogenous left thyroid lobe. Recommend nonemergent thyroid ultrasound if not previously performed. (Ref: J Am Coll Radiol. 2015 Feb;12(2): 143-50). Aortic Atherosclerosis (ICD10-I70.0). Electronically Signed   By: Minerva Fester M.D.   On: 05/25/2023 02:42   DG Chest Portable 1 View Result Date: 05/25/2023 CLINICAL DATA:  Chest pain EXAM: PORTABLE CHEST 1 VIEW COMPARISON:  09/05/2022 FINDINGS: Stable cardiomegaly. Loop recorder. Pulmonary vascular congestion and hazy interstitial opacities in the lower lungs. No pleural effusion or pneumothorax. No displaced rib fractures. IMPRESSION: Cardiomegaly with pulmonary edema. Electronically Signed   By: Joselyn Glassman  Stutzman M.D.   On: 05/25/2023 00:55   CUP PACEART REMOTE DEVICE CHECK Result Date: 05/18/2023 ILR summary report received. Battery status OK. Normal device function. No new symptom, tachy, brady, or pause episodes. No new AF episodes. Monthly summary reports and ROV/PRN LA, CVRS   Microbiology: Results for orders placed or performed during the hospital encounter of 02/23/23  KOH prep     Status: None   Collection Time: 02/23/23  9:00 AM   Specimen: Esophagus  Result Value Ref Range Status   Specimen Description ESOPHAGUS  Final   Special Requests NONE  Final   KOH Prep   Final    FEW YEAST Performed at [facility], [address] [place], [place] [address]    Report Status 02/23/2023 FINAL  Final    Labs: CBC: Recent Labs  Lab 05/25/23 0045 05/26/23 0442  WBC 12.1* 12.2*  NEUTROABS  --  10.0*  HGB 9.8* 9.8*  HCT 30.5* 30.5*  MCV 111.7* 110.9*  PLT 104* 102*   Basic Metabolic Panel: Recent Labs  Lab 05/25/23 0045 05/26/23 0442  NA 139 135  K 5.2* 5.1  CL 97* 96*  CO2 28 27  GLUCOSE 151* 149*  BUN 57* 32*  CREATININE 7.80* 5.01*  CALCIUM 8.8* 8.4*   Liver Function Tests: Recent Labs  Lab 05/25/23 0045  AST 17  ALT 18  ALKPHOS 136*  BILITOT 1.0  PROT  6.4*  ALBUMIN 3.8   CBG: Recent Labs  Lab 05/25/23 2239 05/26/23 0800 05/26/23 1225  GLUCAP 133* 131* 122*    Discharge time spent: greater than 30 minutes.  Signed: Lurene Shadow, MD [facility] 05/26/2023

## 2023-05-26 NOTE — Progress Notes (Signed)
*  PRELIMINARY RESULTS* Echocardiogram 2D Echocardiogram has been performed.  Cristela Blue 05/26/2023, 10:41 AM

## 2023-05-26 NOTE — Progress Notes (Signed)
[facility]  ROUNDING NOTE   Subjective:   Nina Johnston is a 63 year old female with past medical conditions including seizure disorder, diabetes, hypertension, bipolar mood disorder, and end-stage renal disease on hemodialysis.  Patient presents to the emergency department with complaints of chest pain and has been admitted under observation for Thyroid nodule [E04.1] Hypertensive emergency [I16.1] Acute congestive heart failure, unspecified heart failure type [facility]) [I50.9]  Patient is known to our practice and receives outpatient dialysis treatments at [facility] on a MWF schedule, supervised by Dr. Cherylann Ratel.    Patient seen resting on stretcher Brother at bedside Patient states she feels well today Better than on ED arrival Dialysis received yesterday, UF 1 L Room air   Objective:  Vital signs in last 24 hours:  Temp:  [97.8 F (36.6 C)-98.9 F (37.2 C)] 98.6 F (37 C) (01/21 1119) Pulse Rate:  [56-80] 80 (01/21 1200) Resp:  [14-22] 15 (01/21 1200) BP: (142-193)/(59-84) 168/73 (01/21 1200) SpO2:  [87 %-100 %] 99 % (01/21 1200) Weight:  [92.7 kg] 92.7 kg (01/20 1650)  Weight change: -0.015 kg Filed Weights   05/25/23 0036 05/25/23 1650  Weight: 92.7 kg 92.7 kg    Intake/Output: I/O last 3 completed shifts: In: -  Out: 1000 [Other:1000]   Intake/Output this shift:  No intake/output data recorded.  Physical Exam: General: NAD  Head: Normocephalic, atraumatic. Moist oral mucosal membranes  Eyes: Anicteric  Lungs:  Clear to auscultation, normal effort  Heart: Regular rate and rhythm  Abdomen:  Soft, nontender,   Extremities: No peripheral edema.  Neurologic: Nonfocal, moving all four extremities  Skin: No lesions  Access: Left aVF    Basic Metabolic Panel: Recent Labs  Lab 05/25/23 0045 05/26/23 0442  NA 139 135  K 5.2* 5.1  CL 97* 96*  CO2 28 27  GLUCOSE 151* 149*  BUN 57* 32*  CREATININE 7.80* 5.01*  CALCIUM 8.8* 8.4*     Liver Function Tests: Recent Labs  Lab 05/25/23 0045  AST 17  ALT 18  ALKPHOS 136*  BILITOT 1.0  PROT 6.4*  ALBUMIN 3.8   Recent Labs  Lab 05/25/23 0045  LIPASE 37   No results for input(s): "AMMONIA" in the last 168 hours.  CBC: Recent Labs  Lab 05/25/23 0045 05/26/23 0442  WBC 12.1* 12.2*  NEUTROABS  --  10.0*  HGB 9.8* 9.8*  HCT 30.5* 30.5*  MCV 111.7* 110.9*  PLT 104* 102*    Cardiac Enzymes: No results for input(s): "CKTOTAL", "CKMB", "CKMBINDEX", "TROPONINI" in the last 168 hours.  BNP: Invalid input(s): "POCBNP"  CBG: Recent Labs  Lab 05/25/23 2239 05/26/23 0800 05/26/23 1225  GLUCAP 133* 131* 122*    Microbiology: Results for orders placed or performed during the hospital encounter of 02/23/23  KOH prep     Status: None   Collection Time: 02/23/23  9:00 AM   Specimen: Esophagus  Result Value Ref Range Status   Specimen Description ESOPHAGUS  Final   Special Requests NONE  Final   KOH Prep   Final    FEW YEAST Performed at [facility], [address], [place], [place] [address]    Report Status 02/23/2023 FINAL  Final    Coagulation Studies: No results for input(s): "LABPROT", "INR" in the last 72 hours.  Urinalysis: No results for input(s): "COLORURINE", "LABSPEC", "PHURINE", "GLUCOSEU", "HGBUR", "BILIRUBINUR", "KETONESUR", "PROTEINUR", "UROBILINOGEN", "NITRITE", "LEUKOCYTESUR" in the last 72 hours.  Invalid input(s): "APPERANCEUR"    Imaging: DG Cervical Spine  2 or 3 views Result Date: 05/25/2023 CLINICAL DATA:  Neck pain EXAM: CERVICAL SPINE - 2-3 VIEW COMPARISON:  None Available. FINDINGS: Normal alignment and prevertebral soft tissues. Multilevel degenerative changes with anterior large osteophytes at C4 through C6. Slight disc space narrowing at C6-7. Intact odontoid. Facets are aligned. No acute osseous finding. IMPRESSION: Cervical spine degenerative changes as above. No acute finding by plain radiography.  Electronically Signed   By: Judie Petit.  Shick M.D.   On: 05/25/2023 09:21   CT Angio Chest/Abd/Pel for Dissection W and/or Wo Contrast Result Date: 05/25/2023 CLINICAL DATA:  Chest pain while lying on the couch. Sharp pain traveling up into the back of neck and both arms. EXAM: CT ANGIOGRAPHY CHEST, ABDOMEN AND PELVIS TECHNIQUE: Non-contrast CT of the chest was initially obtained. Multidetector CT imaging through the chest, abdomen and pelvis was performed using the standard protocol during bolus administration of intravenous contrast. Multiplanar reconstructed images and MIPs were obtained and reviewed to evaluate the vascular anatomy. RADIATION DOSE REDUCTION: This exam was performed according to the departmental dose-optimization program which includes automated exposure control, adjustment of the mA and/or kV according to patient size and/or use of iterative reconstruction technique. CONTRAST:  OMNIPAQUE IOHEXOL 350 MG/ML SOLN COMPARISON:  Same day chest radiograph; CTA chest 03/17/2022 and CT 10/23/2021 FINDINGS: CTA CHEST FINDINGS Cardiovascular: Cardiomegaly. No pericardial effusion. Normal caliber thoracic aorta without intramural hematoma, penetrating atherosclerotic ulcer, or dissection. Coronary artery and aortic atherosclerotic calcification. No central pulmonary embolism. The main pulmonary artery is dilated measuring 34 mm. Mediastinum/Nodes: Multinodular enlarged heterogenous left thyroid lobe. Bowing of the posterior trachea compatible with expiratory phase exam. Esophagus is unremarkable. No thoracic adenopathy. Lungs/Pleura: Diffuse interlobular septal thickening with patchy ground-glass opacities in the lower lungs. No pleural effusion or pneumothorax. Musculoskeletal: No acute fracture. Review of the MIP images confirms the above findings. CTA ABDOMEN AND PELVIS FINDINGS VASCULAR The aorta and its mesenteric, renal, and iliac artery branches are patent without dissection. 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 No aortic aneurysm. Mixed density atherosclerotic plaque in the mid SMA causes moderate narrowing. Review of the MIP images confirms the above findings. NON-VASCULAR Hepatobiliary: Nodular hepatic contour. Cholecystectomy. No biliary dilation. Pancreas: Unremarkable. Spleen: Borderline splenomegaly measuring 13.4 cm in craniocaudal dimension. Adrenals/Urinary Tract: Left adrenal myolipoma. No follow-up recommended. Normal right adrenal gland. Bilateral cortical renal atrophy. Bilateral calyceal stones and/or parenchymal calcifications. No obstructing ureteral calculi or hydronephrosis. Nondistended bladder. Stomach/Bowel: Normal caliber large and small bowel. Colonic diverticulosis without diverticulitis. Stomach and appendix are within normal limits. Lymphatic: No lymphadenopathy. Reproductive: No acute abnormality. Other: No free intraperitoneal fluid or air.  Body wall edema. Musculoskeletal: No acute fracture. Review of the MIP images confirms the above findings. IMPRESSION: 1. No aortic aneurysm or dissection. 2. Cardiomegaly with pulmonary edema. 3. Dilated main pulmonary artery, which can be seen with pulmonary hypertension. 4. Cirrhosis. 5. Atrophic kidneys with nonobstructing bilateral nephrolithiasis. 6. Multinodular enlarged heterogenous left thyroid lobe. Recommend nonemergent thyroid ultrasound if not previously performed. (Ref: J Am Coll Radiol. 2015 Feb;12(2): 143-50). Aortic Atherosclerosis (ICD10-I70.0). Electronically Signed   By: Minerva Fester M.D.   On: 05/25/2023 02:42   DG Chest Portable 1 View Result Date: 05/25/2023 CLINICAL DATA:  Chest pain EXAM: PORTABLE CHEST 1 VIEW COMPARISON:  09/05/2022 FINDINGS: Stable cardiomegaly. Loop recorder. Pulmonary vascular congestion and hazy interstitial opacities in the lower lungs. No pleural effusion or pneumothorax. No displaced rib fractures. IMPRESSION: Cardiomegaly with pulmonary edema. Electronically Signed   By: Joselyn Glassman  Stutzman M.D.   On: 05/25/2023 00:55     Medications:     B-complex with vitamin C  1 tablet Oral QHS   Chlorhexidine Gluconate Cloth  6 each Topical Q0600   ethosuximide  250 mg Oral BID   febuxostat  80 mg Oral Daily   ferric citrate  630 mg Oral TID WC   heparin  5,000 Units Subcutaneous Q8H   insulin aspart  0-5 Units Subcutaneous QHS   insulin aspart  0-6 Units Subcutaneous TID WC   insulin glargine-yfgn  20 Units Subcutaneous QHS   irbesartan  75 mg Oral Daily   lamoTRIgine  300 mg Oral BID   levETIRAcetam  250 mg Oral 2 times per day on Sunday Tuesday Thursday Saturday   levETIRAcetam  250 mg Oral 3 times per day on Monday Wednesday Friday   levothyroxine  150 mcg Oral QAC breakfast   metoprolol succinate  100 mg Oral BID   multivitamin  1 tablet Oral Daily   acetaminophen **OR** acetaminophen, albuterol, HYDROcodone-acetaminophen, morphine injection, nitroGLYCERIN, ondansetron **OR** ondansetron (ZOFRAN) IV  Assessment/ Plan:  Nina Johnston is a 63 y.o.  female with past medical conditions including seizure disorder, diabetes, hypertension, bipolar mood disorder, and end-stage renal disease on hemodialysis.  Patient presents to the emergency department with complaints of chest pain and has been admitted under observation for Thyroid nodule [E04.1] Hypertensive emergency [I16.1] Acute congestive heart failure, unspecified heart failure type (HCC) [I50.9]   Hyperkalemia with end-stage renal disease on hemodialysis.  Potassium 5.1.  Patient received dialysis yesterday, UF 1 L achieved.  Next treatment scheduled for Wednesday.  2.  Hypertensive urgency, BNP elevated.  CT angio chest shows cardiomegaly with pulmonary edema.  Patient has shown improvement since fluid removal with dialysis yesterday.  Awaiting echo.  3. Anemia of chronic kidney disease Lab Results  Component Value Date   HGB 9.8 (L) 05/26/2023    Hemoglobin acceptable.  Will consider low-dose EPO as  needed.  4. Secondary Hyperparathyroidism:  Lab Results  Component Value Date   PTH 77 (H) 08/21/2018   CALCIUM 8.4 (L) 05/26/2023   CAION 1.08 (L) 02/23/2023   PHOS 3.1 08/21/2018    Calcium and phosphorus within desired range.   LOS: 0 Maley Venezia 1/21/20251:36 PM

## 2023-05-26 NOTE — ED Notes (Signed)
Pt sister, Coy Saunas, called and given update. Informed pt would be getting D/C soon.

## 2023-05-26 NOTE — ED Notes (Signed)
Echo at bedside

## 2023-05-26 NOTE — ED Notes (Signed)
When RN attempted to ambulate patient without O2 per provider. Patient could not take steps to walk. Patient was able to stand in place with her oxygen maintaining in the 90s on RA. When patient attempted to walk her oxygen went into the 80's. Patient was repositioned back into the bed. Patient then complained of pain and requested morphine. RN re-applied [place] at 2L  due to low oxygen and administered morphine.

## 2023-05-27 LAB — HIV ANTIBODY (ROUTINE TESTING W REFLEX): HIV Screen 4th Generation wRfx: REACTIVE — AB

## 2023-05-29 LAB — HIV-1/HIV-2 QUALITATIVE RNA
Final Interpretation: NEGATIVE
HIV-1 RNA, Qualitative: NONREACTIVE
HIV-2 RNA, Qualitative: NONREACTIVE

## 2023-05-29 LAB — HIV-1/2 AB - DIFFERENTIATION
HIV 1 Ab: NONREACTIVE
HIV 2 Ab: NONREACTIVE
Note: NEGATIVE

## 2023-06-03 ENCOUNTER — Telehealth: Payer: Self-pay | Admitting: *Deleted

## 2023-06-03 NOTE — Telephone Encounter (Signed)
I called patient asked her to bring her cpap machine along with power cord to visit on 06/04/23. Pt verbalized she understood.

## 2023-06-04 ENCOUNTER — Other Ambulatory Visit: Payer: Self-pay

## 2023-06-04 ENCOUNTER — Ambulatory Visit: Payer: Medicare Other | Admitting: Neurology

## 2023-06-04 ENCOUNTER — Other Ambulatory Visit: Payer: Self-pay | Admitting: Neurology

## 2023-06-04 VITALS — BP 143/67 | HR 67 | Ht 62.0 in | Wt 190.0 lb

## 2023-06-04 DIAGNOSIS — R195 Other fecal abnormalities: Secondary | ICD-10-CM | POA: Diagnosis not present

## 2023-06-04 DIAGNOSIS — Z992 Dependence on renal dialysis: Secondary | ICD-10-CM

## 2023-06-04 DIAGNOSIS — G40909 Epilepsy, unspecified, not intractable, without status epilepticus: Secondary | ICD-10-CM

## 2023-06-04 DIAGNOSIS — Z87898 Personal history of other specified conditions: Secondary | ICD-10-CM | POA: Diagnosis not present

## 2023-06-04 DIAGNOSIS — R159 Full incontinence of feces: Secondary | ICD-10-CM

## 2023-06-04 DIAGNOSIS — N186 End stage renal disease: Secondary | ICD-10-CM | POA: Insufficient documentation

## 2023-06-04 DIAGNOSIS — E213 Hyperparathyroidism, unspecified: Secondary | ICD-10-CM

## 2023-06-04 NOTE — Patient Instructions (Signed)
   ASSESSMENT AND PLAN 63 y.o. year old female  here with:    1) diarrhea with LOC- amnestic for incontince- seizure ?  No change in meds, follow up with EEG ?   2) blood in stool?  Difficult to prep for Colonoscopy, but may be possible in stationary prep / hospital ?   3) Bone scan density , ESRD on HD and  after years on seizure medications , such as Valproic acid Ethosuximde.    I plan to follow up either personally or through our NP within 8 months.   I would like to thank Judithann Sheen Duane Lope, MD and Marguarite Arbour, Md [address] [facility] [place],  [place] [address] for allowing me to meet with and to take care of this pleasant patient.

## 2023-06-04 NOTE — Progress Notes (Signed)
Provider:  Melvyn Novas, MD  Primary Care Physician:  Marguarite Arbour, MD [address] [facility] [place] [address]     Referring Provider: Marguarite Arbour, Md [address] [facility] [place],  [place] [address]          Chief Complaint according to patient   Patient presents with:     New Patient (Initial Visit)           HISTORY OF PRESENT ILLNESS:  Nina Johnston is a 63 y.o. female patient who is here for revisit 06/04/2023 for  seizure medication management in a patient with ESRD. Marland Kitchen  Chief concern according to patient :  Interval history : 03-26-2023 fell on stairs at home, stumbling, fell upstairs , banisters gave way.  On 03-27-2023, mother fell in the kitchen fell - and could'nt get up. Left undisplaced fractured hip and left shoulder fracture, still in rehab. Had recently been diagnosed with breast cancer.  Herr father  was coughing a lot, felt weak and fell twice on 12.19.2024,  at midnight and  1.30 AM<, while mother was In rehab.  EMS called  and had to go to ED, admitted for 8 days with COVID>  then daughters got covid.  Since recovering, a daytime caretaker was hired.  The previous week ended in hyper-volemia , Arline Asp was admitted with CHF on Sunday  05-24-2023 at night.   This week, twice after hemodialysis ,she had uncontrolled bowel movements. 2 last Monday and one yesterday, Wednesday.  Stool is very dark- and this may be bleeding or ferric citrate. Dr Judithann Sheen sent a hemoccult- last October. I like to repeat one, I suspect seizure as a cause of stool  incontinence  and she is oligouric ESRD>    Chronic diarrhea before and worse since COVID.  Ethosuximide does not prevent all spells but according to her mother has made a great impact.     Refills : none due right now.    We are also following up on Sleep study:06-2022 was negative for OSA, but hypoxia. She has oxygen while on dialysis.  Walking in hsopital  test was positive for hypoxia.  She continued to use CPAP as she feels less straining for air.  Residual AHI was 1/.0/   Needs to see pulmonology.     This was last visits Plan :  63 y.o. year old female patient with ESRD on HD, spells that may reflect seizures, hypoglycemia and hypoxia.  here with:     1) HD day with  spells, seizures- marked as diarrhea and stool incontinence. I will change her meds to post dialysis time on hemodialysis , Lamictal , Keppra, Ethosuximide all bid -    2)  try a wedge and keep O2, if you feel worse after 7 days, call us and we will renew CPAP .    3) increased ethosuximide to 7.5 ml. Instead of 5 ml.    4)  patient had difficult Blood glucose control due to being treated with steroids for skin lesions.    5) Bone density testing in a  long term seizure and dialysis patient is recommended.            Review of Systems: Out of a complete 14 system review, the patient complains of only the following symptoms, and all other reviewed systems are negative.:   How likely are you to doze in the following situations: 0 =  not likely, 1 = slight chance, 2 = moderate chance, 3 = high chance   Sitting and Reading? Watching Television? Sitting inactive in a public place (theater or meeting)? As a passenger in a car for an hour without a break? Lying down in the afternoon when circumstances permit? Sitting and talking to someone? Sitting quietly after lunch without alcohol? In a car, while stopped for a few minutes in traffic?   Total = 12/ 24 points   FSS endorsed at 34/ 63 points.     Social History   Socioeconomic History   Marital status: Single    Spouse name: Not on file   Number of children: 0   Years of education:  college   Highest education level: Not on file  Occupational History   Occupation: not employed  Tobacco Use   Smoking status: Never   Smokeless tobacco: Never  Vaping Use   Vaping status: Never Used  Substance and  Sexual Activity   Alcohol use: No   Drug use: No   Sexual activity: Not Currently  Other Topics Concern   Not on file  Social History Narrative   Patient is single and lives with her parents.   Patient is disabled.   Patient has a college education.   Patient is right- handed.   Patient drinks tea occasionally. 2 glasses of tea when they eat out.   Social Drivers of Corporate investment banker Strain: Low Risk  (03/08/2018)   Overall Financial Resource Strain (CARDIA)    Difficulty of Paying Living Expenses: Not very hard  Food Insecurity: No Food Insecurity (05/25/2023)   Hunger Vital Sign    Worried About Running Out of Food in the Last Year: Never true    Ran Out of Food in the Last Year: Never true  Transportation Needs: No Transportation Needs (05/25/2023)   PRAPARE - Administrator, Civil Service (Medical): No    Lack of Transportation (Non-Medical): No  Physical Activity: Unknown (03/08/2018)   Exercise Vital Sign    Days of Exercise per Week: 0 days    Minutes of Exercise per Session: Not on file  Stress: No Stress Concern Present (03/08/2018)   [employer] of Occupational Health - Occupational Stress Questionnaire    Feeling of Stress : Not at all  Social Connections: Moderately Integrated (05/25/2023)   Social Connection and Isolation Panel [NHANES]    Frequency of Communication with Friends and Family: More than three times a week    Frequency of Social Gatherings with Friends and Family: More than three times a week    Attends Religious Services: More than 4 times per year    Active Member of [employer] or Organizations: Yes    Attends Banker Meetings: 1 to 4 times per year    Marital Status: Divorced    Family History  Problem Relation Age of Onset   Diabetes Mother    Lumbar disc disease Mother    Migraines Mother    Hypertension Mother    Cancer - Prostate Father        mild   Diabetes Father    Hypertension Father    Breast  cancer Neg Hx    Ovarian cancer Neg Hx    Colon cancer Neg Hx    Heart disease Neg Hx    Kidney cancer Neg Hx    Bladder Cancer Neg Hx     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 fistula (  HCC)    Acute bacterial sinusitis 04/19/2014   Adaptive colitis    Anginal pain (HCC)    Anxiety    Anxiety and depression    Arthritis    Asthma    Ataxia 11/10/2012    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  On multiple psychotropic medications, and with DM>    Benign essential HTN 12/26/2014   Bipolar disorder (HCC)    Bronchitis    Carotid artery narrowing 12/22/2014   Cervical prolapse    CHF (congestive heart failure) (HCC)    CKD (chronic kidney disease)    Concussion 01/14/2014   Fall    COPD (chronic obstructive pulmonary disease) (HCC)    Coronary artery disease    Depression    Diabetes mellitus without complication (HCC)    Dialysis patient (HCC)    Diplopia    Dupuytren's contracture of foot    Essential (primary) hypertension 02/12/2015   Family history of thyroid problem    GERD (gastroesophageal reflux disease)    Gout    Heart disease    enlarged because of COPD   Heart murmur    Hepatomegaly    HLD (hyperlipidemia)    Hyperactive airway disease    Hyperkalemia    Hypertension    Hypothyroidism    Irritable colon    Morbid obesity (HCC)    Multiple thyroid nodules    OAB (overactive bladder)    Pernicious anemia    Post menopausal syndrome    Post-concussion syndrome 03/08/2014   Post-traumatic brain syndrome    Prolapsed uterus    PSVT (paroxysmal supraventricular tachycardia) (HCC)    Renal calculus    Renal disease    w/ GFR 29-may be due to diabetes   Seizures (HCC)    last seizure 2012. has had spells since with no knowledge. 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    Transient alteration of awareness 11/10/2012   Urinary incontinence with continuous leakage  03/08/2014    Past Surgical History:  Procedure Laterality Date   A/V FISTULAGRAM Left 07/21/2016   Procedure: A/V Fistulagram;  Surgeon: Annice Needy, MD;  Location: [facility];  Service: Cardiovascular;  Laterality: Left;   A/V FISTULAGRAM Left 12/24/2016   Procedure: A/V Fistulagram;  Surgeon: Annice Needy, MD;  Location: [facility];  Service: Cardiovascular;  Laterality: Left;   A/V FISTULAGRAM Left 06/29/2017   Procedure: A/V FISTULAGRAM;  Surgeon: Annice Needy, MD;  Location: [facility];  Service: Cardiovascular;  Laterality: Left;   A/V FISTULAGRAM Left 03/08/2018   Procedure: A/V FISTULAGRAM;  Surgeon: Annice Needy, MD;  Location: [facility];  Service: Cardiovascular;  Laterality: Left;   A/V FISTULAGRAM Left 04/11/2019   Procedure: A/V FISTULAGRAM;  Surgeon: Annice Needy, MD;  Location: [facility];  Service: Cardiovascular;  Laterality: Left;   A/V SHUNT INTERVENTION N/A 07/21/2016   Procedure: A/V Shunt Intervention;  Surgeon: Annice Needy, MD;  Location: [facility];  Service: Cardiovascular;  Laterality: N/A;   A/V SHUNT INTERVENTION N/A 12/24/2016   Procedure: A/V SHUNT INTERVENTION;  Surgeon: Annice Needy, MD;  Location: [facility];  Service: Cardiovascular;  Laterality: N/A;   AVF     BIOPSY  02/23/2023   Procedure: BIOPSY;  Surgeon: Regis Bill, MD;  Location: [facility];  Service:  Endoscopy;;   BREAST BIOPSY Left 06/12/2022   LEFT stereo bx, calcs, "X" clip-path pending   BREAST BIOPSY Left 06/12/2022   MM LT BREAST BX W LOC DEV 1ST LESION IMAGE BX SPEC STEREO GUIDE 06/12/2022 [facility]   CHOLECYSTECTOMY     COLONOSCOPY     COLONOSCOPY WITH PROPOFOL N/A 03/18/2017   Procedure: COLONOSCOPY WITH PROPOFOL;  Surgeon: Scot Jun, MD;  Location[facility];  Service: Endoscopy;  Laterality: N/A;   COLONOSCOPY WITH PROPOFOL N/A 02/23/2023   Procedure: COLONOSCOPY WITH PROPOFOL;   Surgeon: Regis Bill, MD;  Location: [facility];  Service: Endoscopy;  Laterality: N/A;  DM & DIALYSIS PATIENT   ENDOMETRIAL BIOPSY     ablation, uterine   ESOPHAGEAL BRUSHING  02/23/2023   Procedure: ESOPHAGEAL BRUSHING;  Surgeon: Regis Bill, MD;  Location: [facility];  Service: Endoscopy;;   ESOPHAGOGASTRODUODENOSCOPY (EGD) WITH PROPOFOL N/A 03/18/2017   Procedure: ESOPHAGOGASTRODUODENOSCOPY (EGD) WITH PROPOFOL;  Surgeon: Scot Jun, MD;  Location: [facility] ENDOSCOPY;  Service: Endoscopy;  Laterality: N/A;   ESOPHAGOGASTRODUODENOSCOPY (EGD) WITH PROPOFOL N/A 02/23/2023   Procedure: ESOPHAGOGASTRODUODENOSCOPY (EGD) WITH PROPOFOL;  Surgeon: Regis Bill, MD;  Location: [facility];  Service: Endoscopy;  Laterality: N/A;   HERNIA REPAIR     HERNIA REPAIR     PERIPHERAL VASCULAR CATHETERIZATION N/A 08/23/2015   Procedure: Dialysis/Perma Catheter Insertion;  Surgeon: Annice Needy, MD;  Location: [facility];  Service: Cardiovascular;  Laterality: N/A;   PERIPHERAL VASCULAR CATHETERIZATION N/A 11/09/2015   Procedure: Dialysis/Perma Catheter Removal;  Surgeon: Renford Dills, MD;  Location: [facility];  Service: Cardiovascular;  Laterality: N/A;   PERIPHERAL VASCULAR CATHETERIZATION Left 04/23/2016   Procedure: A/V Fistulagram;  Surgeon: Annice Needy, MD;  Location: [facility];  Service: Cardiovascular;  Laterality: Left;   PORT A CATH REVISION       Current Outpatient Medications on File Prior to Visit  Medication Sig Dispense Refill   aspirin EC 81 MG tablet Take 81 mg by mouth every other day. Swallow whole.     AURYXIA 1 GM 210 MG(Fe) tablet Take 630 mg by mouth 3 (three) times daily.     B Complex-C-Folic Acid (DIALYVITE TABLET) TABS Take 1 tablet by mouth daily.  12   clonazePAM (KLONOPIN) 1 MG tablet TAKE 1-2 TABLETS (1-2 MG TOTAL) BY MOUTH DAILY. 90 tablet 3   cloNIDine (CATAPRES - DOSED IN MG/24 HR) 0.2 mg/24hr patch  Place 0.2 mg onto the skin once a week.     Continuous Blood Gluc Sensor (DEXCOM G6 SENSOR) MISC Use 1 every 10 days     Continuous Blood Gluc Transmit (DEXCOM G6 TRANSMITTER) MISC USE EVERY 3 MONTHS     ethosuximide (ZARONTIN) 250 MG/5ML solution TAKE 7. 5 ML BY MOUTH TWICE A DAY TAKE AFTER HD TREATMENT ON DIALYSIS DAY. 270 mL 5   famotidine (PEPCID) 40 MG tablet SMARTSIG:1 Tablet(s) By Mouth Every Evening     Febuxostat 80 MG TABS Take 80 mg by mouth daily.     fexofenadine (ALLEGRA) 180 MG tablet Take 180 mg by mouth daily.     furosemide (LASIX) 40 MG tablet Take 60 mg by mouth 2 (two) times daily. TAKE ONE AND ONE-HALF TABLETS TWICE DAILY     Glucagon, rDNA, (GLUCAGON EMERGENCY) 1 MG KIT INJECT 1 MG INTO THE MUSCLE AS DIRECTED     insulin glargine (LANTUS) 100 UNIT/ML injection Inject 16 Units into the skin at bedtime. 16  units on Monday,Wednesday,Friday, 12 units on dialysis days     insulin lispro (HUMALOG) 100 UNIT/ML injection Inject 8-15 Units into the skin 3 (three) times daily with meals. Per sliding scale     LAMICTAL 200 MG tablet TAKE 1.5 TABLETS (300 MG TOTAL) BY MOUTH 2 (TWO) TIMES DAILY. GLAXO BRAND NAME 270 tablet 1   levETIRAcetam (KEPPRA) 250 MG tablet Take Bid on non -dialysis days, 3 a day on hemodialysis days. 270 tablet 5   levothyroxine (SYNTHROID) 150 MCG tablet Take 150 mcg by mouth daily before breakfast.      Methoxy PEG-Epoetin Beta (MIRCERA IJ) Inject as directed. Receives w/ dialysis, unsure how often right now     metoprolol succinate (TOPROL XL) 100 MG 24 hr tablet Take 1 tablet (100 mg total) by mouth in the morning and at bedtime. Take with or immediately following a meal. 180 tablet 3   montelukast (SINGULAIR) 10 MG tablet SMARTSIG:1 Tablet(s) By Mouth Every Evening     pantoprazole (PROTONIX) 40 MG tablet Take 40 mg by mouth every morning.      Probiotic Product (ALIGN) 4 MG CAPS Take 4 mg by mouth daily.      UNIFINE PENTIPS 32G X 4 MM MISC USE UP TO 5 TIMES  DAILY AS DIRECTED  3   valsartan (DIOVAN) 80 MG tablet Take 1 tablet (80 mg total) by mouth daily. 180 tablet 3   No current facility-administered medications on file prior to visit.    Allergies  Allergen Reactions   Cinnamon Other (See Comments)    Raises blood pressure   Procrit [Epoetin Alfa-Epbx] Other (See Comments)    Seizures, grand mal   Ace Inhibitors Cough   Azithromycin Other (See Comments)    seizures   Iron Other (See Comments)    Can not take in IV Form - causes seizures    Neomycin-Bacitracin Zn-Polymyx Other (See Comments)    Unknown   Other    Sulfa Antibiotics Other (See Comments)    Due to risks for seizures.     DIAGNOSTIC DATA (LABS, IMAGING, TESTING) - I reviewed patient records, labs, notes, testing and imaging myself where available.  Lab Results  Component Value Date   WBC 12.2 (H) 05/26/2023   HGB 9.8 (L) 05/26/2023   HCT 30.5 (L) 05/26/2023   MCV 110.9 (H) 05/26/2023   PLT 102 (L) 05/26/2023      Component Value Date/Time   NA 135 05/26/2023 0442   NA 142 03/08/2014 1634   NA 141 07/04/2011 0747   K 5.1 05/26/2023 0442   K 4.6 07/04/2011 0747   CL 96 (L) 05/26/2023 0442   CL 109 (H) 07/04/2011 0747   CO2 27 05/26/2023 0442   CO2 20 (L) 07/04/2011 0747   GLUCOSE 149 (H) 05/26/2023 0442   GLUCOSE 198 (H) 07/04/2011 0747   BUN 32 (H) 05/26/2023 0442   BUN 82 (HH) 03/08/2014 1634   BUN 65 (H) 07/04/2011 0747   CREATININE 5.01 (H) 05/26/2023 0442   CREATININE 1.95 (H) 07/04/2011 0747   CALCIUM 8.4 (L) 05/26/2023 0442   CALCIUM 8.6 07/04/2011 0747   PROT 6.4 (L) 05/25/2023 0045   PROT 6.8 03/08/2014 1634   PROT 7.3 07/04/2011 0747   ALBUMIN 3.8 05/25/2023 0045   ALBUMIN 4.4 03/08/2014 1634   ALBUMIN 3.8 07/04/2011 0747   AST 17 05/25/2023 0045   AST 11 (L) 07/04/2011 0747   ALT 18 05/25/2023 0045   ALT 14 07/04/2011 0747  ALKPHOS 136 (H) 05/25/2023 0045   ALKPHOS 188 (H) 07/04/2011 0747   BILITOT 1.0 05/25/2023 0045   BILITOT  0.2 07/04/2011 0747   GFRNONAA 9 (L) 05/26/2023 0442   GFRNONAA 29 (L) 07/04/2011 0747   GFRAA 15 (L) 01/29/2019 1431   GFRAA 35 (L) 07/04/2011 0747   No results found for: "CHOL", "HDL", "LDLCALC", "LDLDIRECT", "TRIG", "CHOLHDL" Lab Results  Component Value Date   HGBA1C 6.1 (H) 05/26/2023   No results found for: "VITAMINB12" Lab Results  Component Value Date   TSH 2.373 04/10/2016    PHYSICAL EXAM:  Today's Vitals   06/04/23 1317  BP: (!) 143/67  Pulse: 67  Weight: 190 lb (86.2 kg)  Height: 5\' 2"  (1.575 m)   Body mass index is 34.75 kg/m.   Wt Readings from Last 3 Encounters:  06/04/23 190 lb (86.2 kg)  05/25/23 204 lb 5.9 oz (92.7 kg)  02/23/23 201 lb (91.2 kg)     Ht Readings from Last 3 Encounters:  06/04/23 5\' 2"  (1.575 m)  05/25/23 5\' 3"  (1.6 m)  02/23/23 5\' 3"  (1.6 m)      General: The patient is awake, alert and appears not in acute distress. The patient is well groomed. Head: Normocephalic, atraumatic. The patient is awake, alert and appears not in acute distress. The patient is well groomed. Head: Normocephalic, atraumatic. Neck is supple.  Mallampati 1 - there is no uvula , neck circumference:15.5 inches.    On renal low potassium diet, she lost 45 pounds, BMI 37 kg/m2  . Saturday  was hemodialysis day - 48 hours since last treatment.  She is hoarse today.  Thrill over left arm , AV fistula.    Cardiovascular:  Regular rate and rhythm, without  murmurs or carotid bruit, and without distended neck veins. Respiratory: Lungs are clear to auscultation. Skin: ankle  edema, no rash.  Looking bronzed .  Dry skin,  AV fistula.  Left arm, non dominant hand.  Trunk: BMI is morbidly obese-   Abdominal hernia.  patient has normal posture.    Neurologic exam : The patient is awake and alert, oriented to place and time.she is subdued.  Memory subjective described as intact.  There is a normal attention span & concentration ability.  Speech is fluent with  hoarseness.    Mood and affect are normal. Cranial nerves: Pupils are equal and slowly  reactive to light.  No disrounded pupils, beginning cloudiness, cataract right over left-  Yellowish sclerea.  Visual fields by finger perimetry are intact.Hearing to tuning fork  rub intact. Facial sensation intact to fine touch.  Facial motor strength is symmetric and tongue moves midline.   Motor exam:  reduced tone and normal muscle bulk, obesity related attenuated DTRs but not absent.  and symmetric normal strength in upper extremities.  Grip strength is weaker over the left - due to fistula.  She has her AV access fistula on the left arm. Thrill is palpable and loudly audible.  Her family stated she is using the left arm less.   Has less grip strength on the left shoulder is droopy on the left- . Now with clumsiness more on the right as well. Tremor.  (She now showers while  seated).    Sensory: Fine touch,and vibration were tested in all extremities and are present in toes and feet. Proprioception is tested and normal.  Coordination:  Left arm with AV fistula and thrill- Rapid alternating movements in the fingers/hands is tested - .  Finger-to-nose maneuver tested l with left side evidence of ataxia, dysmetria and a new non resting, action  tremor.    Gait and station: Patient walks without assistive device and is very slow -  Appears truncally rigid, with no lumbar rotation, turns with 5 steps, and has reduced step width.  . Stance is stable and of wider base. Tandem gait is fragmented. She shuffles.     Deep tendon reflexes: in the upper and lower extremities are symmetric and intact.  Babinski maneuver response is down going on the left and equivocal on the right.        ASSESSMENT AND PLAN 63 y.o. year old female  here with:    1) diarrhea with LOC- amnestic for incontince- seizure ?  No change in meds, follow up with EEG ?   2) blood in stool?  Difficult to prep for Colonoscopy, but  may be possible in stationary prep / hospital ?   3) Bone scan density , ESRD on HD and  after years on seizure medications , such as Valproic acid Ethosuximde.    I plan to follow up either personally or through our NP within 8 months.   I would like to thank Judithann Sheen Duane Lope, MD and Marguarite Arbour, Md [address] [facility] [place],  [place] [address] for allowing me to meet with and to take care of this pleasant patient.     After spending a total time of  41-  minutes face to face and additional time for physical and neurologic examination, review of laboratory studies,  personal review of imaging studies, reports and results of other testing and review of referral information / records as far as provided in visit,   Electronically signed by: Melvyn Novas, MD 06/04/2023 1:37 PM  [facility] Sleep Board certified by The [employer] of Sleep Medicine and Diplomate of the [employer] of Sleep Medicine. Board certified In Neurology through the ABPN, Fellow of the [employer] of Neurology.

## 2023-06-09 ENCOUNTER — Other Ambulatory Visit: Payer: Self-pay | Admitting: Neurology

## 2023-06-09 DIAGNOSIS — R270 Ataxia, unspecified: Secondary | ICD-10-CM

## 2023-06-09 DIAGNOSIS — R4789 Other speech disturbances: Secondary | ICD-10-CM

## 2023-06-09 DIAGNOSIS — G40209 Localization-related (focal) (partial) symptomatic epilepsy and epileptic syndromes with complex partial seizures, not intractable, without status epilepticus: Secondary | ICD-10-CM

## 2023-06-11 ENCOUNTER — Ambulatory Visit (INDEPENDENT_AMBULATORY_CARE_PROVIDER_SITE_OTHER): Payer: Medicare Other

## 2023-06-11 ENCOUNTER — Ambulatory Visit (INDEPENDENT_AMBULATORY_CARE_PROVIDER_SITE_OTHER): Payer: Medicare Other | Admitting: Nurse Practitioner

## 2023-06-11 ENCOUNTER — Encounter (INDEPENDENT_AMBULATORY_CARE_PROVIDER_SITE_OTHER): Payer: Self-pay | Admitting: Nurse Practitioner

## 2023-06-11 VITALS — Resp 18 | Ht 62.5 in | Wt 190.6 lb

## 2023-06-11 DIAGNOSIS — Z992 Dependence on renal dialysis: Secondary | ICD-10-CM | POA: Diagnosis not present

## 2023-06-11 DIAGNOSIS — N186 End stage renal disease: Secondary | ICD-10-CM

## 2023-06-11 DIAGNOSIS — I10 Essential (primary) hypertension: Secondary | ICD-10-CM

## 2023-06-11 DIAGNOSIS — E1122 Type 2 diabetes mellitus with diabetic chronic kidney disease: Secondary | ICD-10-CM | POA: Diagnosis not present

## 2023-06-15 ENCOUNTER — Telehealth (INDEPENDENT_AMBULATORY_CARE_PROVIDER_SITE_OTHER): Payer: Self-pay

## 2023-06-15 NOTE — Progress Notes (Signed)
 Subjective:    Patient ID: Nina Johnston, female    DOB: 1960-10-03, 63 y.o.   MRN: [id number] Chief Complaint  Patient presents with   Follow-up    6 month follow up with HDA    The patient returns to the office for follow up regarding a problem with their dialysis access.   The patient notes a significant increase in problems with dialysis.  The patient notes having significant bleeding during dialysis.  The patient denies hand pain or other symptoms consistent with steal phenomena.  No significant arm swelling.  The patient denies redness or swelling at the access site. The patient denies fever or chills at home or while on dialysis.  No recent shortening of the patient's walking distance or new symptoms consistent with claudication.  No history of rest pain symptoms. No new ulcers or wounds of the lower extremities have occurred.  The patient denies amaurosis fugax or recent TIA symptoms. There are no recent neurological changes noted. There is no history of DVT, PE or superficial thrombophlebitis. No recent episodes of angina or shortness of breath documented.   Duplex ultrasound of the AV access shows a patent access.  The previously noted stenosis is significantly increased compared to last study.  Flow volume today is 1157 cc/min (previous flow volume was 790 cc/min)    Review of Systems  Hematological:  Bruises/bleeds easily.  All other systems reviewed and are negative.      Objective:   Physical Exam Vitals reviewed.  HENT:     Head: Normocephalic.  Cardiovascular:     Rate and Rhythm: Normal rate.     Pulses:          Radial pulses are 2+ on the right side and 2+ on the left side.     Arteriovenous access: Left arteriovenous access is present.    Comments: Good thrill and bruit Pulmonary:     Effort: Pulmonary effort is normal.  Skin:    General: Skin is warm and dry.  Neurological:     Mental Status: She is alert and oriented to person, place, and  time.  Psychiatric:        Mood and Affect: Mood normal.        Behavior: Behavior normal.        Thought Content: Thought content normal.        Judgment: Judgment normal.     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  On multiple psychotropic medications, and with DM>    Benign essential HTN 12/26/2014   Bipolar disorder (HCC)    Bronchitis    Carotid artery narrowing 12/22/2014   Cervical prolapse    CHF (congestive heart failure) (HCC)    CKD (chronic kidney disease)    Concussion 01/14/2014   Fall    COPD (chronic obstructive pulmonary disease) (HCC)    Coronary artery disease    Depression    Diabetes mellitus without complication (HCC)    Dialysis patient (HCC)    Diplopia    Dupuytren's contracture  of foot    Essential (primary) hypertension 02/12/2015   Family history of thyroid  problem    GERD (gastroesophageal reflux disease)    Gout    Heart disease    enlarged because of COPD   Heart murmur    Hepatomegaly    HLD (hyperlipidemia)    Hyperactive airway disease    Hyperkalemia    Hypertension    Hypothyroidism    Irritable colon    Morbid obesity (HCC)    Multiple thyroid  nodules    OAB (overactive bladder)    Pernicious anemia    Post menopausal syndrome    Post-concussion syndrome 03/08/2014   Post-traumatic brain syndrome    Prolapsed uterus    PSVT (paroxysmal supraventricular tachycardia) (HCC)    Renal calculus    Renal disease    w/ GFR 29-may be due to diabetes   Seizures (HCC)    last seizure 2012. has had spells since with no knowledge. 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    Transient alteration of awareness 11/10/2012   Urinary incontinence with continuous leakage 03/08/2014    Social History   Socioeconomic History   Marital status: Single    Spouse name: Not on file   Number of children: 0   Years of education:  college   Highest education level: Not on file  Occupational History   Occupation: not employed  Tobacco Use   Smoking status: Never   Smokeless tobacco: Never  Vaping Use   Vaping status: Never Used  Substance and Sexual Activity   Alcohol use: No   Drug use: No   Sexual activity: Not Currently  Other Topics Concern   Not on file  Social History Narrative   Patient is single and lives with her parents.   Patient is disabled.   Patient has a college education.   Patient is right- handed.   Patient drinks tea occasionally. 2 glasses of tea when they eat out.   Social Drivers of Corporate Investment Banker Strain: Low Risk  (03/08/2018)   Overall Financial Resource Strain (CARDIA)    Difficulty of Paying Living Expenses: Not very hard  Food Insecurity: No Food Insecurity (05/25/2023)   Hunger Vital Sign    Worried About Running Out of Food in the Last Year: Never true    Ran Out of Food in the Last Year: Never true  Transportation Needs: No Transportation Needs (05/25/2023)   PRAPARE - Administrator, Civil Service (Medical): No    Lack of Transportation (Non-Medical): No  Physical Activity: Unknown (03/08/2018)   Exercise Vital Sign    Days of Exercise per Week: 0 days    Minutes of Exercise per Session: Not on file  Stress: No Stress Concern Present (03/08/2018)   [employer] of Occupational Health - Occupational Stress Questionnaire    Feeling of Stress : Not at all  Social Connections: Moderately Integrated (05/25/2023)   Social Connection and Isolation Panel [NHANES]    Frequency of Communication with Friends and Family: More than three times a week    Frequency of Social Gatherings  with Friends and Family: More than three times a week    Attends Religious Services: More than 4 times per year    Active Member of [employer] or Organizations: Yes    Attends Banker Meetings: 1 to 4 times per year  Marital Status: Divorced  Catering manager Violence: Not At Risk (05/25/2023)   Humiliation, Afraid, Rape, and Kick questionnaire    Fear of Current or Ex-Partner: No    Emotionally Abused: No    Physically Abused: No    Sexually Abused: No    Past Surgical History:  Procedure Laterality Date   A/V FISTULAGRAM Left 07/21/2016   Procedure: A/V Fistulagram;  Surgeon: Annice Needy, MD;  Location: [facility];  Service: Cardiovascular;  Laterality: Left;   A/V FISTULAGRAM Left 12/24/2016   Procedure: A/V Fistulagram;  Surgeon: Annice Needy, MD;  Location: [facility];  Service: Cardiovascular;  Laterality: Left;   A/V FISTULAGRAM Left 06/29/2017   Procedure: A/V FISTULAGRAM;  Surgeon: Annice Needy, MD;  Location: [facility];  Service: Cardiovascular;  Laterality: Left;   A/V FISTULAGRAM Left 03/08/2018   Procedure: A/V FISTULAGRAM;  Surgeon: Annice Needy, MD;  Location: [facility];  Service: Cardiovascular;  Laterality: Left;   A/V FISTULAGRAM Left 04/11/2019   Procedure: A/V FISTULAGRAM;  Surgeon: Annice Needy, MD;  Location: [facility];  Service: Cardiovascular;  Laterality: Left;   A/V SHUNT INTERVENTION N/A 07/21/2016   Procedure: A/V Shunt Intervention;  Surgeon: Annice Needy, MD;  Location: [facility];  Service: Cardiovascular;  Laterality: N/A;   A/V SHUNT INTERVENTION N/A 12/24/2016   Procedure: A/V SHUNT INTERVENTION;  Surgeon: Annice Needy, MD;  Location: [facility];  Service: Cardiovascular;  Laterality: N/A;   AVF     BIOPSY  02/23/2023   Procedure: BIOPSY;  Surgeon: Regis Bill, MD;  Location: [facility];  Service: Endoscopy;;   BREAST BIOPSY Left 06/12/2022   LEFT stereo bx,  calcs, "X" clip-path pending   BREAST BIOPSY Left 06/12/2022   MM LT BREAST BX W LOC DEV 1ST LESION IMAGE BX SPEC STEREO GUIDE 06/12/2022 [facility]   CHOLECYSTECTOMY     COLONOSCOPY     COLONOSCOPY WITH PROPOFOL N/A 03/18/2017   Procedure: COLONOSCOPY WITH PROPOFOL;  Surgeon: Scot Jun, MD;  Location[facility];  Service: Endoscopy;  Laterality: N/A;   COLONOSCOPY WITH PROPOFOL N/A 02/23/2023   Procedure: COLONOSCOPY WITH PROPOFOL;  Surgeon: Regis Bill, MD;  Location: [facility];  Service: Endoscopy;  Laterality: N/A;  DM & DIALYSIS PATIENT   ENDOMETRIAL BIOPSY     ablation, uterine   ESOPHAGEAL BRUSHING  02/23/2023   Procedure: ESOPHAGEAL BRUSHING;  Surgeon: Regis Bill, MD;  Location: [facility];  Service: Endoscopy;;   ESOPHAGOGASTRODUODENOSCOPY (EGD) WITH PROPOFOL N/A 03/18/2017   Procedure: ESOPHAGOGASTRODUODENOSCOPY (EGD) WITH PROPOFOL;  Surgeon: Scot Jun, MD;  Location: [facility] ENDOSCOPY;  Service: Endoscopy;  Laterality: N/A;   ESOPHAGOGASTRODUODENOSCOPY (EGD) WITH PROPOFOL N/A 02/23/2023   Procedure: ESOPHAGOGASTRODUODENOSCOPY (EGD) WITH PROPOFOL;  Surgeon: Regis Bill, MD;  Location: [facility];  Service: Endoscopy;  Laterality: N/A;   HERNIA REPAIR     HERNIA REPAIR     PERIPHERAL VASCULAR CATHETERIZATION N/A 08/23/2015   Procedure: Dialysis/Perma Catheter Insertion;  Surgeon: Annice Needy, MD;  Location: [facility];  Service: Cardiovascular;  Laterality: N/A;   PERIPHERAL VASCULAR CATHETERIZATION N/A 11/09/2015   Procedure: Dialysis/Perma Catheter Removal;  Surgeon: Renford Dills, MD;  Location: [facility];  Service: Cardiovascular;  Laterality: N/A;   PERIPHERAL VASCULAR CATHETERIZATION Left 04/23/2016   Procedure: A/V Fistulagram;  Surgeon: Annice Needy, MD;  Location: [facility];  Service: Cardiovascular;  Laterality: Left;   PORT A CATH REVISION      Family History  Problem Relation  Age of Onset   Diabetes Mother    Lumbar disc disease Mother    Migraines Mother    Hypertension Mother    Cancer - Prostate Father        mild   Diabetes Father    Hypertension Father    Breast cancer Neg Hx    Ovarian cancer Neg Hx    Colon cancer Neg Hx    Heart disease Neg Hx    Kidney cancer Neg Hx    Bladder Cancer Neg Hx     Allergies  Allergen Reactions   Cinnamon Other (See Comments)    Raises blood pressure   Procrit [Epoetin Alfa-Epbx] Other (See Comments)    Seizures, grand mal   Ace Inhibitors Cough   Azithromycin Other (See Comments)    seizures   Iron  Other (See Comments)    Can not take in IV Form - causes seizures    Neomycin -Bacitracin  Zn-Polymyx Other (See Comments)    Unknown   Other    Sulfa Antibiotics Other (See Comments)    Due to risks for seizures.       Latest Ref Rng & Units 05/26/2023    4:42 AM 05/25/2023   12:45 AM 02/23/2023    7:55 AM  CBC  WBC 4.0 - 10.5 K/uL 12.2  12.1    Hemoglobin 12.0 - 15.0 g/dL 9.8  9.8  9.5   Hematocrit 36.0 - 46.0 % 30.5  30.5  28.0   Platelets 150 - 400 K/uL 102  104        CMP     Component Value Date/Time   NA 135 05/26/2023 0442   NA 142 03/08/2014 1634   NA 141 07/04/2011 0747   K 5.1 05/26/2023 0442   K 4.6 07/04/2011 0747   CL 96 (L) 05/26/2023 0442   CL 109 (H) 07/04/2011 0747   CO2 27 05/26/2023 0442   CO2 20 (L) 07/04/2011 0747   GLUCOSE 149 (H) 05/26/2023 0442   GLUCOSE 198 (H) 07/04/2011 0747   BUN 32 (H) 05/26/2023 0442   BUN 82 (HH) 03/08/2014 1634   BUN 65 (H) 07/04/2011 0747   CREATININE 5.01 (H) 05/26/2023 0442   CREATININE 1.95 (H) 07/04/2011 0747   CALCIUM 8.4 (L) 05/26/2023 0442   CALCIUM 8.6 07/04/2011 0747   PROT 6.4 (L) 05/25/2023 0045   PROT 6.8 03/08/2014 1634   PROT 7.3 07/04/2011 0747   ALBUMIN 3.8 05/25/2023 0045   ALBUMIN 4.4 03/08/2014 1634   ALBUMIN 3.8 07/04/2011 0747   AST 17 05/25/2023 0045   AST 11 (L) 07/04/2011 0747   ALT 18 05/25/2023 0045   ALT  14 07/04/2011 0747   ALKPHOS 136 (H) 05/25/2023 0045   ALKPHOS 188 (H) 07/04/2011 0747   BILITOT 1.0 05/25/2023 0045   BILITOT 0.2 07/04/2011 0747   GFRNONAA 9 (L) 05/26/2023 0442   GFRNONAA 29 (L) 07/04/2011 0747     No results found.     Assessment & Plan:   1. End-stage renal disease on hemodialysis (HCC) (Primary) Recommend:  The patient is experiencing increasing problems with their dialysis access.  Patient should have a fistulagram with the intention for intervention.  The intention for intervention is to restore appropriate flow and prevent thrombosis and possible loss of the access.  As well as improve the quality of dialysis therapy.  The risks, benefits and alternative  therapies were reviewed in detail with the patient.  All questions were answered.  The patient agrees to proceed with angio/intervention.    The patient will follow up with me in the office after the procedure.   2. Type 2 diabetes mellitus with chronic kidney disease on chronic dialysis, unspecified whether long term insulin  use (HCC) Continue hypoglycemic medications as already ordered, these medications have been reviewed and there are no changes at this time.  Hgb A1C to be monitored as already arranged by primary service  3. Benign essential HTN Continue antihypertensive medications as already ordered, these medications have been reviewed and there are no changes at this time.   Current Outpatient Medications on File Prior to Visit  Medication Sig Dispense Refill   aspirin EC 81 MG tablet Take 81 mg by mouth every other day. Swallow whole.     AURYXIA  1 GM 210 MG(Fe) tablet Take 630 mg by mouth 3 (three) times daily.     B Complex-C-Folic Acid  (DIALYVITE TABLET) TABS Take 1 tablet by mouth daily.  12   clonazePAM  (KLONOPIN ) 1 MG tablet TAKE 1-2 TABLETS (1-2 MG TOTAL) BY MOUTH DAILY. 90 tablet 3   cloNIDine (CATAPRES - DOSED IN MG/24 HR) 0.2 mg/24hr patch Place 0.2 mg onto the skin once a week.      Continuous Blood Gluc Sensor (DEXCOM G6 SENSOR) MISC Use 1 every 10 days     Continuous Blood Gluc Transmit (DEXCOM G6 TRANSMITTER) MISC USE EVERY 3 MONTHS     ethosuximide  (ZARONTIN ) 250 MG/5ML solution TAKE 7. 5 ML BY MOUTH TWICE A DAY TAKE AFTER HD TREATMENT ON DIALYSIS DAY. 270 mL 5   famotidine  (PEPCID ) 40 MG tablet SMARTSIG:1 Tablet(s) By Mouth Every Evening     Febuxostat  80 MG TABS Take 80 mg by mouth daily.     fexofenadine (ALLEGRA) 180 MG tablet Take 180 mg by mouth daily.     furosemide  (LASIX ) 40 MG tablet Take 60 mg by mouth 2 (two) times daily. TAKE ONE AND ONE-HALF TABLETS TWICE DAILY     Glucagon, rDNA, (GLUCAGON EMERGENCY) 1 MG KIT INJECT 1 MG INTO THE MUSCLE AS DIRECTED     insulin  glargine (LANTUS ) 100 UNIT/ML injection Inject 16 Units into the skin at bedtime. 16 units on Monday,Wednesday,Friday, 12 units on dialysis days     insulin  lispro (HUMALOG) 100 UNIT/ML injection Inject 8-15 Units into the skin 3 (three) times daily with meals. Per sliding scale     LAMICTAL  200 MG tablet TAKE 1.5 TABLETS (300 MG TOTAL) BY MOUTH 2 (TWO) TIMES DAILY. GLAXO BRAND NAME 270 tablet 1   levETIRAcetam  (KEPPRA ) 250 MG tablet Take Bid on non -dialysis days, 3 a day on hemodialysis days. 270 tablet 5   levothyroxine  (SYNTHROID ) 150 MCG tablet Take 150 mcg by mouth daily before breakfast.      Methoxy PEG-Epoetin Beta (MIRCERA IJ) Inject as directed. Receives w/ dialysis, unsure how often right now     metoprolol  succinate (TOPROL  XL) 100 MG 24 hr tablet Take 1 tablet (100 mg total) by mouth in the morning and at bedtime. Take with or immediately following a meal. 180 tablet 3   montelukast  (SINGULAIR ) 10 MG tablet SMARTSIG:1 Tablet(s) By Mouth Every Evening     pantoprazole  (PROTONIX ) 40 MG tablet Take 40 mg by mouth every morning.      Probiotic Product (ALIGN) 4 MG CAPS Take 4 mg by mouth daily.      UNIFINE PENTIPS 32G X 4 MM  MISC USE UP TO 5 TIMES DAILY AS DIRECTED  3   valsartan  (DIOVAN) 80 MG tablet Take 1 tablet (80 mg total) by mouth daily. 180 tablet 3   No current facility-administered medications on file prior to visit.    There are no Patient Instructions on file for this visit. No follow-ups on file.   Georgiana Spinner, NP

## 2023-06-15 NOTE — H&P (View-Only) (Signed)
 Subjective:    Patient ID: Nina Johnston, female    DOB: April 17, 1961, 63 y.o.   MRN: [id number] Chief Complaint  Patient presents with   Follow-up    6 month follow up with HDA    The patient returns to the office for follow up regarding a problem with their dialysis access.   The patient notes a significant increase in problems with dialysis.  The patient notes having significant bleeding during dialysis.  The patient denies hand pain or other symptoms consistent with steal phenomena.  No significant arm swelling.  The patient denies redness or swelling at the access site. The patient denies fever or chills at home or while on dialysis.  No recent shortening of the patient's walking distance or new symptoms consistent with claudication.  No history of rest pain symptoms. No new ulcers or wounds of the lower extremities have occurred.  The patient denies amaurosis fugax or recent TIA symptoms. There are no recent neurological changes noted. There is no history of DVT, PE or superficial thrombophlebitis. No recent episodes of angina or shortness of breath documented.   Duplex ultrasound of the AV access shows a patent access.  The previously noted stenosis is significantly increased compared to last study.  Flow volume today is 1157 cc/min (previous flow volume was 790 cc/min)    Review of Systems  Hematological:  Bruises/bleeds easily.  All other systems reviewed and are negative.      Objective:   Physical Exam Vitals reviewed.  HENT:     Head: Normocephalic.  Cardiovascular:     Rate and Rhythm: Normal rate.     Pulses:          Radial pulses are 2+ on the right side and 2+ on the left side.     Arteriovenous access: Left arteriovenous access is present.    Comments: Good thrill and bruit Pulmonary:     Effort: Pulmonary effort is normal.  Skin:    General: Skin is warm and dry.  Neurological:     Mental Status: She is alert and oriented to person, place, and  time.  Psychiatric:        Mood and Affect: Mood normal.        Behavior: Behavior normal.        Thought Content: Thought content normal.        Judgment: Judgment normal.     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  On multiple psychotropic medications, and with DM>    Benign essential HTN 12/26/2014   Bipolar disorder (HCC)    Bronchitis    Carotid artery narrowing 12/22/2014   Cervical prolapse    CHF (congestive heart failure) (HCC)    CKD (chronic kidney disease)    Concussion 01/14/2014   Fall    COPD (chronic obstructive pulmonary disease) (HCC)    Coronary artery disease    Depression    Diabetes mellitus without complication (HCC)    Dialysis patient (HCC)    Diplopia    Dupuytren's contracture  of foot    Essential (primary) hypertension 02/12/2015   Family history of thyroid  problem    GERD (gastroesophageal reflux disease)    Gout    Heart disease    enlarged because of COPD   Heart murmur    Hepatomegaly    HLD (hyperlipidemia)    Hyperactive airway disease    Hyperkalemia    Hypertension    Hypothyroidism    Irritable colon    Morbid obesity (HCC)    Multiple thyroid  nodules    OAB (overactive bladder)    Pernicious anemia    Post menopausal syndrome    Post-concussion syndrome 03/08/2014   Post-traumatic brain syndrome    Prolapsed uterus    PSVT (paroxysmal supraventricular tachycardia) (HCC)    Renal calculus    Renal disease    w/ GFR 29-may be due to diabetes   Seizures (HCC)    last seizure 2012. has had spells since with no knowledge. 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    Transient alteration of awareness 11/10/2012   Urinary incontinence with continuous leakage 03/08/2014    Social History   Socioeconomic History   Marital status: Single    Spouse name: Not on file   Number of children: 0   Years of education:  college   Highest education level: Not on file  Occupational History   Occupation: not employed  Tobacco Use   Smoking status: Never   Smokeless tobacco: Never  Vaping Use   Vaping status: Never Used  Substance and Sexual Activity   Alcohol use: No   Drug use: No   Sexual activity: Not Currently  Other Topics Concern   Not on file  Social History Narrative   Patient is single and lives with her parents.   Patient is disabled.   Patient has a college education.   Patient is right- handed.   Patient drinks tea occasionally. 2 glasses of tea when they eat out.   Social Drivers of Corporate Investment Banker Strain: Low Risk  (03/08/2018)   Overall Financial Resource Strain (CARDIA)    Difficulty of Paying Living Expenses: Not very hard  Food Insecurity: No Food Insecurity (05/25/2023)   Hunger Vital Sign    Worried About Running Out of Food in the Last Year: Never true    Ran Out of Food in the Last Year: Never true  Transportation Needs: No Transportation Needs (05/25/2023)   PRAPARE - Administrator, Civil Service (Medical): No    Lack of Transportation (Non-Medical): No  Physical Activity: Unknown (03/08/2018)   Exercise Vital Sign    Days of Exercise per Week: 0 days    Minutes of Exercise per Session: Not on file  Stress: No Stress Concern Present (03/08/2018)   [employer] of Occupational Health - Occupational Stress Questionnaire    Feeling of Stress : Not at all  Social Connections: Moderately Integrated (05/25/2023)   Social Connection and Isolation Panel [NHANES]    Frequency of Communication with Friends and Family: More than three times a week    Frequency of Social Gatherings  with Friends and Family: More than three times a week    Attends Religious Services: More than 4 times per year    Active Member of [employer] or Organizations: Yes    Attends Banker Meetings: 1 to 4 times per year  Marital Status: Divorced  Catering Manager Violence: Not At Risk (05/25/2023)   Humiliation, Afraid, Rape, and Kick questionnaire    Fear of Current or Ex-Partner: No    Emotionally Abused: No    Physically Abused: No    Sexually Abused: No    Past Surgical History:  Procedure Laterality Date   A/V FISTULAGRAM Left 07/21/2016   Procedure: A/V Fistulagram;  Surgeon: Selinda GORMAN Gu, MD;  Location: [facility];  Service: Cardiovascular;  Laterality: Left;   A/V FISTULAGRAM Left 12/24/2016   Procedure: A/V Fistulagram;  Surgeon: Gu Selinda GORMAN, MD;  Location: [facility];  Service: Cardiovascular;  Laterality: Left;   A/V FISTULAGRAM Left 06/29/2017   Procedure: A/V FISTULAGRAM;  Surgeon: Gu Selinda GORMAN, MD;  Location: [facility];  Service: Cardiovascular;  Laterality: Left;   A/V FISTULAGRAM Left 03/08/2018   Procedure: A/V FISTULAGRAM;  Surgeon: Gu Selinda GORMAN, MD;  Location: [facility];  Service: Cardiovascular;  Laterality: Left;   A/V FISTULAGRAM Left 04/11/2019   Procedure: A/V FISTULAGRAM;  Surgeon: Gu Selinda GORMAN, MD;  Location: [facility];  Service: Cardiovascular;  Laterality: Left;   A/V SHUNT INTERVENTION N/A 07/21/2016   Procedure: A/V Shunt Intervention;  Surgeon: Selinda GORMAN Gu, MD;  Location: [facility];  Service: Cardiovascular;  Laterality: N/A;   A/V SHUNT INTERVENTION N/A 12/24/2016   Procedure: A/V SHUNT INTERVENTION;  Surgeon: Gu Selinda GORMAN, MD;  Location: [facility];  Service: Cardiovascular;  Laterality: N/A;   AVF     BIOPSY  02/23/2023   Procedure: BIOPSY;  Surgeon: Maryruth Ole DASEN, MD;  Location: [facility];  Service: Endoscopy;;   BREAST BIOPSY Left 06/12/2022   LEFT stereo bx,  calcs, X clip-path pending   BREAST BIOPSY Left 06/12/2022   MM LT BREAST BX W LOC DEV 1ST LESION IMAGE BX SPEC STEREO GUIDE 06/12/2022 [facility]   CHOLECYSTECTOMY     COLONOSCOPY     COLONOSCOPY WITH PROPOFOL  N/A 03/18/2017   Procedure: COLONOSCOPY WITH PROPOFOL ;  Surgeon: Viktoria Lamar DASEN, MD;  Location: [facility];  Service: Endoscopy;  Laterality: N/A;   COLONOSCOPY WITH PROPOFOL  N/A 02/23/2023   Procedure: COLONOSCOPY WITH PROPOFOL ;  Surgeon: Maryruth Ole DASEN, MD;  Location: [facility];  Service: Endoscopy;  Laterality: N/A;  DM & DIALYSIS PATIENT   ENDOMETRIAL BIOPSY     ablation, uterine   ESOPHAGEAL BRUSHING  02/23/2023   Procedure: ESOPHAGEAL BRUSHING;  Surgeon: Maryruth Ole DASEN, MD;  Location: [facility];  Service: Endoscopy;;   ESOPHAGOGASTRODUODENOSCOPY (EGD) WITH PROPOFOL  N/A 03/18/2017   Procedure: ESOPHAGOGASTRODUODENOSCOPY (EGD) WITH PROPOFOL ;  Surgeon: Viktoria Lamar DASEN, MD;  [facility];  Service: Endoscopy;  Laterality: N/A;   ESOPHAGOGASTRODUODENOSCOPY (EGD) WITH PROPOFOL  N/A 02/23/2023   Procedure: ESOPHAGOGASTRODUODENOSCOPY (EGD) WITH PROPOFOL ;  Surgeon: Maryruth Ole DASEN, MD;  Location: [facility];  Service: Endoscopy;  Laterality: N/A;   HERNIA REPAIR     HERNIA REPAIR     PERIPHERAL VASCULAR CATHETERIZATION N/A 08/23/2015   Procedure: Dialysis/Perma Catheter Insertion;  Surgeon: Selinda GORMAN Gu, MD;  Location: [facility];  Service: Cardiovascular;  Laterality: N/A;   PERIPHERAL VASCULAR CATHETERIZATION N/A 11/09/2015   Procedure: Dialysis/Perma Catheter Removal;  Surgeon: Cordella KANDICE Shawl, MD;  Location: [facility];  Service: Cardiovascular;  Laterality: N/A;   PERIPHERAL VASCULAR CATHETERIZATION Left 04/23/2016   Procedure: A/V Fistulagram;  Surgeon: Selinda GORMAN Gu, MD;  Location: [facility];  Service: Cardiovascular;  Laterality: Left;   PORT A CATH REVISION      Family History  Problem Relation  Age of Onset   Diabetes Mother    Lumbar disc disease Mother    Migraines Mother    Hypertension Mother    Cancer - Prostate Father        mild   Diabetes Father    Hypertension Father    Breast cancer Neg Hx    Ovarian cancer Neg Hx    Colon cancer Neg Hx    Heart disease Neg Hx    Kidney cancer Neg Hx    Bladder Cancer Neg Hx     Allergies  Allergen Reactions   Cinnamon Other (See Comments)    Raises blood pressure   Procrit [Epoetin Alfa-Epbx] Other (See Comments)    Seizures, grand mal   Ace Inhibitors Cough   Azithromycin Other (See Comments)    seizures   Iron  Other (See Comments)    Can not take in IV Form - causes seizures    Neomycin -Bacitracin  Zn-Polymyx Other (See Comments)    Unknown   Other    Sulfa Antibiotics Other (See Comments)    Due to risks for seizures.       Latest Ref Rng & Units 05/26/2023    4:42 AM 05/25/2023   12:45 AM 02/23/2023    7:55 AM  CBC  WBC 4.0 - 10.5 K/uL 12.2  12.1    Hemoglobin 12.0 - 15.0 g/dL 9.8  9.8  9.5   Hematocrit 36.0 - 46.0 % 30.5  30.5  28.0   Platelets 150 - 400 K/uL 102  104        CMP     Component Value Date/Time   NA 135 05/26/2023 0442   NA 142 03/08/2014 1634   NA 141 07/04/2011 0747   K 5.1 05/26/2023 0442   K 4.6 07/04/2011 0747   CL 96 (L) 05/26/2023 0442   CL 109 (H) 07/04/2011 0747   CO2 27 05/26/2023 0442   CO2 20 (L) 07/04/2011 0747   GLUCOSE 149 (H) 05/26/2023 0442   GLUCOSE 198 (H) 07/04/2011 0747   BUN 32 (H) 05/26/2023 0442   BUN 82 (HH) 03/08/2014 1634   BUN 65 (H) 07/04/2011 0747   CREATININE 5.01 (H) 05/26/2023 0442   CREATININE 1.95 (H) 07/04/2011 0747   CALCIUM 8.4 (L) 05/26/2023 0442   CALCIUM 8.6 07/04/2011 0747   PROT 6.4 (L) 05/25/2023 0045   PROT 6.8 03/08/2014 1634   PROT 7.3 07/04/2011 0747   ALBUMIN 3.8 05/25/2023 0045   ALBUMIN 4.4 03/08/2014 1634   ALBUMIN 3.8 07/04/2011 0747   AST 17 05/25/2023 0045   AST 11 (L) 07/04/2011 0747   ALT 18 05/25/2023 0045   ALT  14 07/04/2011 0747   ALKPHOS 136 (H) 05/25/2023 0045   ALKPHOS 188 (H) 07/04/2011 0747   BILITOT 1.0 05/25/2023 0045   BILITOT 0.2 07/04/2011 0747   GFRNONAA 9 (L) 05/26/2023 0442   GFRNONAA 29 (L) 07/04/2011 0747     No results found.     Assessment & Plan:   1. End-stage renal disease on hemodialysis (HCC) (Primary) Recommend:  The patient is experiencing increasing problems with their dialysis access.  Patient should have a fistulagram with the intention for intervention.  The intention for intervention is to restore appropriate flow and prevent thrombosis and possible loss of the access.  As well as improve the quality of dialysis therapy.  The risks, benefits and alternative  therapies were reviewed in detail with the patient.  All questions were answered.  The patient agrees to proceed with angio/intervention.    The patient will follow up with me in the office after the procedure.   2. Type 2 diabetes mellitus with chronic kidney disease on chronic dialysis, unspecified whether long term insulin  use (HCC) Continue hypoglycemic medications as already ordered, these medications have been reviewed and there are no changes at this time.  Hgb A1C to be monitored as already arranged by primary service  3. Benign essential HTN Continue antihypertensive medications as already ordered, these medications have been reviewed and there are no changes at this time.   Current Outpatient Medications on File Prior to Visit  Medication Sig Dispense Refill   aspirin EC 81 MG tablet Take 81 mg by mouth every other day. Swallow whole.     AURYXIA  1 GM 210 MG(Fe) tablet Take 630 mg by mouth 3 (three) times daily.     B Complex-C-Folic Acid  (DIALYVITE TABLET) TABS Take 1 tablet by mouth daily.  12   clonazePAM  (KLONOPIN ) 1 MG tablet TAKE 1-2 TABLETS (1-2 MG TOTAL) BY MOUTH DAILY. 90 tablet 3   cloNIDine (CATAPRES - DOSED IN MG/24 HR) 0.2 mg/24hr patch Place 0.2 mg onto the skin once a week.      Continuous Blood Gluc Sensor (DEXCOM G6 SENSOR) MISC Use 1 every 10 days     Continuous Blood Gluc Transmit (DEXCOM G6 TRANSMITTER) MISC USE EVERY 3 MONTHS     ethosuximide  (ZARONTIN ) 250 MG/5ML solution TAKE 7. 5 ML BY MOUTH TWICE A DAY TAKE AFTER HD TREATMENT ON DIALYSIS DAY. 270 mL 5   famotidine  (PEPCID ) 40 MG tablet SMARTSIG:1 Tablet(s) By Mouth Every Evening     Febuxostat  80 MG TABS Take 80 mg by mouth daily.     fexofenadine (ALLEGRA) 180 MG tablet Take 180 mg by mouth daily.     furosemide  (LASIX ) 40 MG tablet Take 60 mg by mouth 2 (two) times daily. TAKE ONE AND ONE-HALF TABLETS TWICE DAILY     Glucagon, rDNA, (GLUCAGON EMERGENCY) 1 MG KIT INJECT 1 MG INTO THE MUSCLE AS DIRECTED     insulin  glargine (LANTUS ) 100 UNIT/ML injection Inject 16 Units into the skin at bedtime. 16 units on Monday,Wednesday,Friday, 12 units on dialysis days     insulin  lispro (HUMALOG) 100 UNIT/ML injection Inject 8-15 Units into the skin 3 (three) times daily with meals. Per sliding scale     LAMICTAL  200 MG tablet TAKE 1.5 TABLETS (300 MG TOTAL) BY MOUTH 2 (TWO) TIMES DAILY. GLAXO BRAND NAME 270 tablet 1   levETIRAcetam  (KEPPRA ) 250 MG tablet Take Bid on non -dialysis days, 3 a day on hemodialysis days. 270 tablet 5   levothyroxine  (SYNTHROID ) 150 MCG tablet Take 150 mcg by mouth daily before breakfast.      Methoxy PEG-Epoetin Beta (MIRCERA IJ) Inject as directed. Receives w/ dialysis, unsure how often right now     metoprolol  succinate (TOPROL  XL) 100 MG 24 hr tablet Take 1 tablet (100 mg total) by mouth in the morning and at bedtime. Take with or immediately following a meal. 180 tablet 3   montelukast  (SINGULAIR ) 10 MG tablet SMARTSIG:1 Tablet(s) By Mouth Every Evening     pantoprazole  (PROTONIX ) 40 MG tablet Take 40 mg by mouth every morning.      Probiotic Product (ALIGN) 4 MG CAPS Take 4 mg by mouth daily.      UNIFINE PENTIPS 32G X 4 MM  MISC USE UP TO 5 TIMES DAILY AS DIRECTED  3   valsartan   (DIOVAN ) 80 MG tablet Take 1 tablet (80 mg total) by mouth daily. 180 tablet 3   No current facility-administered medications on file prior to visit.    There are no Patient Instructions on file for this visit. No follow-ups on file.   Geneve Kimpel E Sedrick Tober, NP

## 2023-06-15 NOTE — Telephone Encounter (Signed)
 Spoke with the patient and she is scheduled with Dr. Vonna Guardian for a left arm fistulagram on 06/25/23 with a 12:00 pm arrival time to the [facility]. Pre-procedure instructions were discussed and will be sent to Mychart and mailed.

## 2023-06-15 NOTE — Telephone Encounter (Signed)
 Last seen on 06/04/23 Follow up scheduled on 10/01/23

## 2023-06-17 ENCOUNTER — Other Ambulatory Visit: Payer: Self-pay | Admitting: Neurology

## 2023-06-17 DIAGNOSIS — R4789 Other speech disturbances: Secondary | ICD-10-CM

## 2023-06-17 DIAGNOSIS — R270 Ataxia, unspecified: Secondary | ICD-10-CM

## 2023-06-17 DIAGNOSIS — G40209 Localization-related (focal) (partial) symptomatic epilepsy and epileptic syndromes with complex partial seizures, not intractable, without status epilepticus: Secondary | ICD-10-CM

## 2023-06-17 NOTE — Telephone Encounter (Signed)
Rx was sent on 06/15/23

## 2023-06-22 ENCOUNTER — Ambulatory Visit (INDEPENDENT_AMBULATORY_CARE_PROVIDER_SITE_OTHER): Payer: Medicare Other

## 2023-06-22 DIAGNOSIS — I4892 Unspecified atrial flutter: Secondary | ICD-10-CM

## 2023-06-23 LAB — CUP PACEART REMOTE DEVICE CHECK
Date Time Interrogation Session: 20250216231902
Implantable Pulse Generator Implant Date: 20240619

## 2023-06-24 ENCOUNTER — Encounter: Payer: Self-pay | Admitting: Cardiology

## 2023-06-25 ENCOUNTER — Other Ambulatory Visit: Payer: Medicare Other | Admitting: *Deleted

## 2023-06-25 ENCOUNTER — Ambulatory Visit
Admission: RE | Admit: 2023-06-25 | Discharge: 2023-06-25 | Disposition: A | Discharge status: Home / Self Care | Payer: Medicare Other | Attending: Vascular Surgery | Admitting: Vascular Surgery

## 2023-06-25 ENCOUNTER — Encounter: Payer: Self-pay | Admitting: Vascular Surgery

## 2023-06-25 ENCOUNTER — Other Ambulatory Visit: Payer: Self-pay

## 2023-06-25 ENCOUNTER — Encounter
Admission: RE | Disposition: A | Discharge status: Home / Self Care | Payer: Self-pay | Source: Home / Self Care | Attending: Vascular Surgery

## 2023-06-25 DIAGNOSIS — E1122 Type 2 diabetes mellitus with diabetic chronic kidney disease: Secondary | ICD-10-CM | POA: Diagnosis not present

## 2023-06-25 DIAGNOSIS — I509 Heart failure, unspecified: Secondary | ICD-10-CM | POA: Diagnosis not present

## 2023-06-25 DIAGNOSIS — I132 Hypertensive heart and chronic kidney disease with heart failure and with stage 5 chronic kidney disease, or end stage renal disease: Secondary | ICD-10-CM | POA: Insufficient documentation

## 2023-06-25 DIAGNOSIS — T82898A Other specified complication of vascular prosthetic devices, implants and grafts, initial encounter: Secondary | ICD-10-CM | POA: Insufficient documentation

## 2023-06-25 DIAGNOSIS — Y832 Surgical operation with anastomosis, bypass or graft as the cause of abnormal reaction of the patient, or of later complication, without mention of misadventure at the time of the procedure: Secondary | ICD-10-CM | POA: Diagnosis not present

## 2023-06-25 DIAGNOSIS — N186 End stage renal disease: Secondary | ICD-10-CM | POA: Insufficient documentation

## 2023-06-25 DIAGNOSIS — Z794 Long term (current) use of insulin: Secondary | ICD-10-CM | POA: Diagnosis not present

## 2023-06-25 DIAGNOSIS — Z992 Dependence on renal dialysis: Secondary | ICD-10-CM | POA: Diagnosis not present

## 2023-06-25 HISTORY — PX: A/V FISTULAGRAM: CATH118298

## 2023-06-25 LAB — POTASSIUM (ARMC VASCULAR LAB ONLY): Potassium (ARMC vascular lab): 4.6 mmol/L (ref 3.5–5.1)

## 2023-06-25 SURGERY — A/V FISTULAGRAM
Anesthesia: Moderate Sedation | Laterality: Left

## 2023-06-25 MED ORDER — HYDROMORPHONE HCL 1 MG/ML IJ SOLN: 1.0000 mg | Freq: Once | INTRAMUSCULAR | Status: DC | PRN

## 2023-06-25 MED ORDER — FENTANYL CITRATE (PF) 100 MCG/2ML IJ SOLN
INTRAMUSCULAR | Status: AC
  Filled 2023-06-25: qty 2

## 2023-06-25 MED ORDER — SODIUM CHLORIDE 0.9 % IV SOLN: INTRAVENOUS | Status: DC

## 2023-06-25 MED ORDER — CEFAZOLIN SODIUM-DEXTROSE 1-4 GM/50ML-% IV SOLN
1.0000 g | INTRAVENOUS | Status: AC
  Administered 2023-06-25: 1 g via INTRAVENOUS

## 2023-06-25 MED ORDER — FENTANYL CITRATE (PF) 100 MCG/2ML IJ SOLN
INTRAMUSCULAR | Status: DC | PRN
  Administered 2023-06-25: 50 ug via INTRAVENOUS

## 2023-06-25 MED ORDER — IODIXANOL 320 MG/ML IV SOLN
INTRAVENOUS | Status: DC | PRN
  Administered 2023-06-25: 20 mL via INTRAVENOUS

## 2023-06-25 MED ORDER — ONDANSETRON HCL 4 MG/2ML IJ SOLN
INTRAMUSCULAR | Status: AC
  Filled 2023-06-25: qty 2

## 2023-06-25 MED ORDER — HEPARIN SODIUM (PORCINE) 1000 UNIT/ML IJ SOLN
INTRAMUSCULAR | Status: AC
  Filled 2023-06-25: qty 10

## 2023-06-25 MED ORDER — MIDAZOLAM HCL 2 MG/ML PO SYRP: 8.0000 mg | ORAL_SOLUTION | Freq: Once | ORAL | Status: DC | PRN

## 2023-06-25 MED ORDER — METHYLPREDNISOLONE SODIUM SUCC 125 MG IJ SOLR
125.0000 mg | Freq: Once | INTRAMUSCULAR | Status: DC | PRN
Start: 2023-06-25 — End: 2023-06-25

## 2023-06-25 MED ORDER — DIPHENHYDRAMINE HCL 50 MG/ML IJ SOLN
50.0000 mg | Freq: Once | INTRAMUSCULAR | Status: DC | PRN
Start: 2023-06-25 — End: 2023-06-25

## 2023-06-25 MED ORDER — MIDAZOLAM HCL 5 MG/5ML IJ SOLN
INTRAMUSCULAR | Status: AC
  Filled 2023-06-25: qty 5

## 2023-06-25 MED ORDER — FAMOTIDINE 20 MG PO TABS: 40.0000 mg | ORAL_TABLET | Freq: Once | ORAL | Status: DC | PRN

## 2023-06-25 MED ORDER — HEPARIN SODIUM (PORCINE) 1000 UNIT/ML IJ SOLN
INTRAMUSCULAR | Status: DC | PRN
  Administered 2023-06-25: 3000 [IU] via INTRAVENOUS

## 2023-06-25 MED ORDER — LIDOCAINE-EPINEPHRINE (PF) 1 %-1:200000 IJ SOLN
INTRAMUSCULAR | Status: DC | PRN
  Administered 2023-06-25: 5 mL via INTRADERMAL

## 2023-06-25 MED ORDER — ONDANSETRON HCL 4 MG/2ML IJ SOLN
4.0000 mg | Freq: Four times a day (QID) | INTRAMUSCULAR | Status: DC | PRN
  Administered 2023-06-25: 4 mg via INTRAVENOUS

## 2023-06-25 MED ORDER — CEFAZOLIN SODIUM-DEXTROSE 1-4 GM/50ML-% IV SOLN
INTRAVENOUS | Status: AC
  Filled 2023-06-25: qty 50

## 2023-06-25 MED ORDER — HEPARIN (PORCINE) IN NACL 1000-0.9 UT/500ML-% IV SOLN
INTRAVENOUS | Status: DC | PRN
  Administered 2023-06-25: 500 mL

## 2023-06-25 MED ORDER — MIDAZOLAM HCL 2 MG/2ML IJ SOLN
INTRAMUSCULAR | Status: DC | PRN
  Administered 2023-06-25: 2 mg via INTRAVENOUS

## 2023-06-25 SURGICAL SUPPLY — 6 items
CANNULA 5F STIFF (CANNULA) IMPLANT
COVER PROBE ULTRASOUND 5X96 (MISCELLANEOUS) IMPLANT
DRAPE BRACHIAL (DRAPES) IMPLANT
PACK ANGIOGRAPHY (CUSTOM PROCEDURE TRAY) ×1 IMPLANT
SHEATH BRITE TIP 6FRX5.5 (SHEATH) IMPLANT
SUT MNCRL AB 4-0 PS2 18 (SUTURE) IMPLANT

## 2023-06-25 NOTE — Op Note (Signed)
[facility]    OPERATIVE NOTE   PROCEDURE: 1.   Left radiocephalic arteriovenous fistula cannulation under ultrasound guidance 2.   Left arm fistulagram including central venogram   PRE-OPERATIVE DIAGNOSIS: 1. ESRD 2. Poorly functional left radiocephalic AVF  POST-OPERATIVE DIAGNOSIS: same as above   SURGEON: Festus Barren, MD  ANESTHESIA: local with MCS  ESTIMATED BLOOD LOSS: 3 cc  FINDING(S): The left radiocephalic AV fistula was large and somewhat aneurysmal throughout much of the forearm.  It was patent without any significant stenosis.  There were areas of smaller size where the vein was more normal in size and areas of tortuosity, but nothing that appears significantly narrowed at all.  At the antecubital fossa, there was dual outflow through both the deep venous system and the cephalic vein.  These were both large and patent.  The deep venous system had the more robust flow and there was narrowing at the cephalic vein subclavian vein confluence, but this was not clinically significant.  The central venous circulation was widely patent.  SPECIMEN(S):  None  CONTRAST: 20 cc  FLUORO TIME: 1. minutes  MODERATE CONSCIOUS SEDATION TIME: Approximately 20 minutes with 2 mg of Versed and 50 mcg of Fentanyl   INDICATIONS: Nina Johnston is a 63 y.o. female who presents with malfunctioning left radiocephalic arteriovenous fistula.  The patient is scheduled for left arm fistulagram.  The patient is aware the risks include but are not limited to: bleeding, infection, thrombosis of the cannulated access, and possible anaphylactic reaction to the contrast.  The patient is aware of the risks of the procedure and elects to proceed forward.  DESCRIPTION: After full informed written consent was obtained, the patient was brought back to the angiography suite and placed supine upon the angiography table.  The patient was connected to monitoring equipment. Moderate  conscious sedation was administered with a face to face encounter with the patient throughout the procedure with my supervision of the RN administering medicines and monitoring the patient's vital signs and mental status throughout from the start of the procedure until the patient was taken to the recovery room. The left arm was prepped and draped in the standard fashion for a percutaneous access intervention.  Under ultrasound guidance, the left radiocephalic arteriovenous fistula was cannulated with a micropuncture needle under direct ultrasound guidance where it was patent near the anastomosis in an antegrade fashion and a permanent image was performed.  The microwire was advanced into the fistula and the needle was exchanged for the a microsheath.  I then upsized to a 6 Fr Sheath and imaging was performed.  Hand injections were completed to image the access including the central venous system. The left radiocephalic AV fistula was large and somewhat aneurysmal throughout much of the forearm.  It was patent without any significant stenosis.  There were areas of smaller size where the vein was more normal in size and areas of tortuosity, but nothing that appears significantly narrowed at all.  At the antecubital fossa, there was dual outflow through both the deep venous system and the cephalic vein.  These were both large and patent.  The deep venous system had the more robust flow and there was narrowing at the cephalic vein subclavian vein confluence, but this was not clinically significant.  The central venous circulation was widely patent.   Based on the completion imaging, no further intervention is necessary.  A 4-0 Monocryl purse-string suture was sewn around the sheath.  The sheath  was removed while tying down the suture.  A sterile bandage was applied to the puncture site.  COMPLICATIONS: None  CONDITION: Stable   Festus Barren  06/25/2023 2:26 PM   This note was created with Dragon Medical  transcription system. Any errors in dictation are purely unintentional.

## 2023-06-25 NOTE — Progress Notes (Signed)
Pt. Up to [facility]-had lg. Soft formed odorous BM. No active emesis. Pt. States nausea is better, "almost gone." Pt. Sitting up , sipping on gingerale. No acute distress at present.

## 2023-06-25 NOTE — Progress Notes (Addendum)
Dr. Wyn Quaker at bedside, speaking with pt. And her mother re: procedural results. Both verbalized understanding of conversation with MD. Pt. Med. With Zofran 4 mg slow iVP for c/o "10 " out of 10 nausea at present.

## 2023-06-25 NOTE — Interval H&P Note (Signed)
History and Physical Interval Note:  06/25/2023 11:54 AM  Nina Johnston  has presented today for surgery, with the diagnosis of L arm fistulagram   End Stage Renal.  The various methods of treatment have been discussed with the patient and family. After consideration of risks, benefits and other options for treatment, the patient has consented to  Procedure(s): A/V Fistulagram (Left) as a surgical intervention.  The patient's history has been reviewed, patient examined, no change in status, stable for surgery.  I have reviewed the patient's chart and labs.  Questions were answered to the patient's satisfaction.     Festus Barren

## 2023-06-26 ENCOUNTER — Encounter: Payer: Self-pay | Admitting: Vascular Surgery

## 2023-06-26 LAB — GLUCOSE, CAPILLARY: Glucose-Capillary: 117 mg/dL — ABNORMAL HIGH (ref 70–99)

## 2023-06-30 NOTE — Progress Notes (Signed)
 Carelink Summary Report / Loop Recorder

## 2023-07-03 ENCOUNTER — Other Ambulatory Visit: Payer: Self-pay | Admitting: Internal Medicine

## 2023-07-03 DIAGNOSIS — Z1231 Encounter for screening mammogram for malignant neoplasm of breast: Secondary | ICD-10-CM

## 2023-07-08 ENCOUNTER — Other Ambulatory Visit: Payer: Medicare Other | Admitting: *Deleted

## 2023-07-14 ENCOUNTER — Ambulatory Visit
Admission: RE | Admit: 2023-07-14 | Discharge: 2023-07-14 | Disposition: A | Discharge status: Home / Self Care | Payer: Medicare Other | Source: Ambulatory Visit | Attending: Internal Medicine | Admitting: Internal Medicine

## 2023-07-14 DIAGNOSIS — Z1231 Encounter for screening mammogram for malignant neoplasm of breast: Secondary | ICD-10-CM | POA: Insufficient documentation

## 2023-07-15 ENCOUNTER — Other Ambulatory Visit (HOSPITAL_COMMUNITY): Payer: Self-pay

## 2023-07-15 ENCOUNTER — Telehealth: Payer: Self-pay | Admitting: Neurology

## 2023-07-15 ENCOUNTER — Telehealth: Payer: Self-pay

## 2023-07-15 NOTE — Telephone Encounter (Signed)
 Pt's mother reports that the insurance company states LAMICTAL 200 MG tablet is not on formulary list, they need a letter giving a reason as to why a substitute is not  adequate

## 2023-07-15 NOTE — Telephone Encounter (Signed)
 Called and spoke to pts mother and she stated that lamictal isn't covered. She stated that she has approximately 3 weeks left. I stated that we would work on a pa and get back to them with the determination. I advised them to call back by next Wednesday if they do not hear from [place]. Pt's mother voiced gratitude and understanding of all discussed

## 2023-07-15 NOTE — Telephone Encounter (Signed)
 PA request has been Submitted. New Encounter has been or will be created for follow up. For additional info see Pharmacy Prior Auth telephone encounter from 07/15/2023.

## 2023-07-15 NOTE — Telephone Encounter (Signed)
 Pharmacy Patient Advocate Encounter   Received notification from Pt Calls Messages that prior authorization for LaMICtal 200MG  tablets is required/requested.   Insurance verification completed.   The patient is insured through [employer] .   Per test claim: PA required; PA submitted to above mentioned insurance via CoverMyMeds Key/confirmation #/EOC [facility] Status is pending

## 2023-07-16 NOTE — Telephone Encounter (Signed)
 Pharmacy Patient Advocate Encounter  Received notification from [facility] that Prior Authorization for LaMICtal 200MG  tablets  has been APPROVED from 07/15/2023 to 07/14/2024   PA #/Case ID/Reference #: [id number]

## 2023-07-27 ENCOUNTER — Ambulatory Visit (INDEPENDENT_AMBULATORY_CARE_PROVIDER_SITE_OTHER): Payer: Medicare Other

## 2023-07-27 DIAGNOSIS — I4892 Unspecified atrial flutter: Secondary | ICD-10-CM

## 2023-07-27 LAB — CUP PACEART REMOTE DEVICE CHECK
Date Time Interrogation Session: 20250323231813
Implantable Pulse Generator Implant Date: 20240619

## 2023-07-28 ENCOUNTER — Ambulatory Visit: Attending: Cardiology | Admitting: Cardiology

## 2023-07-28 ENCOUNTER — Encounter: Payer: Self-pay | Admitting: Cardiology

## 2023-07-28 VITALS — BP 133/70 | HR 70 | Resp 20 | Ht 62.0 in | Wt 185.1 lb

## 2023-07-28 DIAGNOSIS — Z8679 Personal history of other diseases of the circulatory system: Secondary | ICD-10-CM | POA: Diagnosis not present

## 2023-07-28 DIAGNOSIS — Z95818 Presence of other cardiac implants and grafts: Secondary | ICD-10-CM | POA: Insufficient documentation

## 2023-07-28 DIAGNOSIS — Z992 Dependence on renal dialysis: Secondary | ICD-10-CM | POA: Diagnosis present

## 2023-07-28 DIAGNOSIS — N186 End stage renal disease: Secondary | ICD-10-CM | POA: Diagnosis present

## 2023-07-28 DIAGNOSIS — I493 Ventricular premature depolarization: Secondary | ICD-10-CM | POA: Insufficient documentation

## 2023-07-28 DIAGNOSIS — R600 Localized edema: Secondary | ICD-10-CM | POA: Diagnosis not present

## 2023-07-28 NOTE — Patient Instructions (Signed)
 Medication Instructions:  No changes at this time.   *If you need a refill on your cardiac medications before your next appointment, please call your pharmacy*   Lab Work: None  If you have labs (blood work) drawn today and your tests are completely normal, you will receive your results only by: MyChart Message (if you have MyChart) OR A paper copy in the mail If you have any lab test that is abnormal or we need to change your treatment, we will call you to review the results.   Testing/Procedures: None   Follow-Up: At [facility], you and your health needs are our priority.  As part of our continuing mission to provide you with exceptional heart care, we have created designated Provider Care Teams.  These Care Teams include your primary Cardiologist (physician) and Advanced Practice Providers (APPs -  Physician Assistants and Nurse Practitioners) who all work together to provide you with the care you need, when you need it.   Your next appointment:   6 month(s)  Provider:   Steffanie Dunn, MD or Sherie Don, NP

## 2023-07-28 NOTE — Progress Notes (Signed)
 Electrophysiology Clinic Note    Date:  07/28/2023  Patient ID:  Nina Johnston, Nina Johnston Mar 18, 1961, MRN [id number] PCP:  Marguarite Arbour, MD  Cardiologist:  Esmeralda Arthur, formerly Arnoldo Hooker Electrophysiologist: Lanier Prude, MD   Discussed the use of AI scribe software for clinical note transcription with the patient, who gave verbal consent to proceed.   Patient Profile    Chief Complaint: AFib follow-up  History of Present Illness: SOPHEAP BASIC is a 63 y.o. female with PMH notable for AFib, ESRD on HD, OSA on CPAP, HTN; seen today for Lanier Prude, MD for routine electrophysiology followup.  She last saw Dr.Lambert 10/2022 where ILR implanted to further eval AFib burden and eliquis was stopped.   On follow-up today, she is concerned about crackles in her lungs that dialysis nurses mentioned to her. She is also concerned about an abnormal rhythm dialysis nurses told her about during her HD.  She denies chest pain, chest pressure, palpitations. She does have intermittent increased lower extremity edema, especially on non-HD days.     Arrhythmia/Device History MDT ILR, imp 10/2022; dx afib monitoring     ROS:  Please see the history of present illness. All other systems are reviewed and otherwise negative.    Physical Exam    VS:  BP 133/70 (BP Location: Right Arm, Patient Position: Sitting, Cuff Size: Normal)   Pulse 70   Resp 20   Ht 5\' 2"  (1.575 m)   Wt 185 lb 2 oz (84 kg)   BMI 33.86 kg/m  BMI: Body mass index is 33.86 kg/m.  Wt Readings from Last 3 Encounters:  07/28/23 185 lb 2 oz (84 kg)  06/25/23 190 lb (86.2 kg)  06/11/23 190 lb 9.6 oz (86.5 kg)     GEN- The patient is well appearing, alert and oriented x 3 today.   Lungs- Clear to ausculation bilaterally, normal work of breathing.  Heart- Regular rate and rhythm, no murmurs, rubs or gallops Extremities- 1+ peripheral edema, warm, dry Skin-  ILR insertion site well-healed   3/25  remote Device interrogation reviewed by myself:  Battery good 1.5% PVC burden No AFib/AT   Studies Reviewed   Previous EP, cardiology notes.    EKG is ordered. Personal review of EKG from today shows:    EKG Interpretation Date/Time:  Tuesday July 28 2023 13:14:40 EDT Ventricular Rate:  59 PR Interval:  172 QRS Duration:  102 QT Interval:  494 QTC Calculation: 489 R Axis:   7  Text Interpretation: Sinus bradycardia Minimal voltage criteria for LVH, may be normal variant ( Cornell product ) Confirmed by Sherie Don [phone]) on 07/28/2023 1:35:04 PM    TTE, 05/26/2023  1. Left ventricular ejection fraction, by estimation, is 55 to 60%. The left ventricle has normal function. The left ventricle has no regional wall motion abnormalities. Left ventricular diastolic parameters are consistent with Grade II diastolic dysfunction (pseudonormalization).   2. Right ventricular systolic function is normal. The right ventricular size is normal. There is normal pulmonary artery systolic pressure. The estimated right ventricular systolic pressure is 20.7 mmHg.   3. Left atrial size was mildly dilated.   4. The mitral valve is grossly normal. Mild mitral valve regurgitation.   5. The aortic valve is normal in structure. Aortic valve regurgitation is not visualized. No aortic stenosis is present.      Assessment and Plan     #) SVT #) history of afib #) PVC #)  ILR in situ No afib or SVT episodes via ILR Low PVC burden via ILR. Patient asymptomatic of PVCs with preserved LVEF Off OAC given lack of AFib   #) ESRD #) lower extremity edema Fluid managed through HD       Current medicines are reviewed at length with the patient today.   The patient does not have concerns regarding her medicines.  The following changes were made today:  none  Labs/ tests ordered today include:  Orders Placed This Encounter  Procedures   EKG 12-Lead     Disposition: Follow up with Dr. Lalla Brothers  or EP APP in 6 months, sooner if needed  Continue monthly ILR remote monitoring   Signed, Sherie Don, NP  07/28/23  2:55 PM  Electrophysiology [facility]

## 2023-07-30 NOTE — Addendum Note (Signed)
 Addended by: Geralyn Flash D on: 07/30/2023 03:11 PM   Modules accepted: Orders

## 2023-07-30 NOTE — Progress Notes (Signed)
 Carelink Summary Report / Loop Recorder

## 2023-08-01 ENCOUNTER — Encounter: Payer: Self-pay | Admitting: Cardiology

## 2023-08-24 ENCOUNTER — Other Ambulatory Visit: Payer: Self-pay | Admitting: Neurology

## 2023-08-31 ENCOUNTER — Encounter: Payer: Self-pay | Admitting: Cardiology

## 2023-08-31 ENCOUNTER — Ambulatory Visit (INDEPENDENT_AMBULATORY_CARE_PROVIDER_SITE_OTHER): Payer: Medicare Other

## 2023-08-31 DIAGNOSIS — Z8679 Personal history of other diseases of the circulatory system: Secondary | ICD-10-CM

## 2023-08-31 LAB — CUP PACEART REMOTE DEVICE CHECK
Date Time Interrogation Session: 20250427232106
Implantable Pulse Generator Implant Date: 20240619

## 2023-09-07 ENCOUNTER — Other Ambulatory Visit: Payer: Self-pay | Admitting: Neurology

## 2023-09-07 DIAGNOSIS — G40209 Localization-related (focal) (partial) symptomatic epilepsy and epileptic syndromes with complex partial seizures, not intractable, without status epilepticus: Secondary | ICD-10-CM

## 2023-09-07 DIAGNOSIS — R4789 Other speech disturbances: Secondary | ICD-10-CM

## 2023-09-07 DIAGNOSIS — R270 Ataxia, unspecified: Secondary | ICD-10-CM

## 2023-09-14 NOTE — Progress Notes (Signed)
 Carelink Summary Report / Loop Recorder

## 2023-09-21 ENCOUNTER — Other Ambulatory Visit (INDEPENDENT_AMBULATORY_CARE_PROVIDER_SITE_OTHER): Payer: Self-pay | Admitting: Vascular Surgery

## 2023-09-21 DIAGNOSIS — N186 End stage renal disease: Secondary | ICD-10-CM

## 2023-09-22 ENCOUNTER — Encounter (INDEPENDENT_AMBULATORY_CARE_PROVIDER_SITE_OTHER): Payer: Medicare Other

## 2023-09-22 ENCOUNTER — Encounter (INDEPENDENT_AMBULATORY_CARE_PROVIDER_SITE_OTHER): Payer: Self-pay

## 2023-09-22 ENCOUNTER — Ambulatory Visit (INDEPENDENT_AMBULATORY_CARE_PROVIDER_SITE_OTHER): Payer: Medicare Other | Admitting: Vascular Surgery

## 2023-09-23 ENCOUNTER — Other Ambulatory Visit: Payer: Self-pay | Admitting: Cardiology

## 2023-09-27 ENCOUNTER — Encounter: Payer: Self-pay | Admitting: Emergency Medicine

## 2023-09-27 ENCOUNTER — Other Ambulatory Visit: Payer: Self-pay

## 2023-09-27 ENCOUNTER — Emergency Department

## 2023-09-27 ENCOUNTER — Emergency Department
Admission: EM | Admit: 2023-09-27 | Discharge: 2023-09-27 | Disposition: A | Discharge status: Home / Self Care | Attending: Emergency Medicine | Admitting: Emergency Medicine

## 2023-09-27 DIAGNOSIS — Z992 Dependence on renal dialysis: Secondary | ICD-10-CM | POA: Diagnosis not present

## 2023-09-27 DIAGNOSIS — I132 Hypertensive heart and chronic kidney disease with heart failure and with stage 5 chronic kidney disease, or end stage renal disease: Secondary | ICD-10-CM | POA: Diagnosis not present

## 2023-09-27 DIAGNOSIS — J449 Chronic obstructive pulmonary disease, unspecified: Secondary | ICD-10-CM | POA: Diagnosis not present

## 2023-09-27 DIAGNOSIS — R42 Dizziness and giddiness: Secondary | ICD-10-CM | POA: Insufficient documentation

## 2023-09-27 DIAGNOSIS — W19XXXA Unspecified fall, initial encounter: Secondary | ICD-10-CM | POA: Insufficient documentation

## 2023-09-27 DIAGNOSIS — I509 Heart failure, unspecified: Secondary | ICD-10-CM | POA: Diagnosis not present

## 2023-09-27 DIAGNOSIS — M25561 Pain in right knee: Secondary | ICD-10-CM | POA: Insufficient documentation

## 2023-09-27 DIAGNOSIS — L03116 Cellulitis of left lower limb: Secondary | ICD-10-CM | POA: Diagnosis not present

## 2023-09-27 DIAGNOSIS — N186 End stage renal disease: Secondary | ICD-10-CM | POA: Diagnosis not present

## 2023-09-27 LAB — BRAIN NATRIURETIC PEPTIDE: B Natriuretic Peptide: 671.1 pg/mL — ABNORMAL HIGH (ref 0.0–100.0)

## 2023-09-27 LAB — CBC WITH DIFFERENTIAL/PLATELET
Abs Immature Granulocytes: 0.12 10*3/uL — ABNORMAL HIGH (ref 0.00–0.07)
Basophils Absolute: 0 10*3/uL (ref 0.0–0.1)
Basophils Relative: 1 %
Eosinophils Absolute: 0.1 10*3/uL (ref 0.0–0.5)
Eosinophils Relative: 1 %
HCT: 25.3 % — ABNORMAL LOW (ref 36.0–46.0)
Hemoglobin: 8.4 g/dL — ABNORMAL LOW (ref 12.0–15.0)
Immature Granulocytes: 1 %
Lymphocytes Relative: 23 %
Lymphs Abs: 1.9 10*3/uL (ref 0.7–4.0)
MCH: 35.9 pg — ABNORMAL HIGH (ref 26.0–34.0)
MCHC: 33.2 g/dL (ref 30.0–36.0)
MCV: 108.1 fL — ABNORMAL HIGH (ref 80.0–100.0)
Monocytes Absolute: 0.5 10*3/uL (ref 0.1–1.0)
Monocytes Relative: 6 %
Neutro Abs: 5.8 10*3/uL (ref 1.7–7.7)
Neutrophils Relative %: 68 %
Platelets: 96 10*3/uL — ABNORMAL LOW (ref 150–400)
RBC: 2.34 MIL/uL — ABNORMAL LOW (ref 3.87–5.11)
RDW: 14 % (ref 11.5–15.5)
WBC: 8.4 10*3/uL (ref 4.0–10.5)
nRBC: 0 % (ref 0.0–0.2)

## 2023-09-27 LAB — TROPONIN I (HIGH SENSITIVITY): Troponin I (High Sensitivity): 13 ng/L (ref <18)

## 2023-09-27 LAB — COMPREHENSIVE METABOLIC PANEL WITH GFR
ALT: 17 U/L (ref 0–44)
AST: 21 U/L (ref 15–41)
Albumin: 3.4 g/dL — ABNORMAL LOW (ref 3.5–5.0)
Alkaline Phosphatase: 89 U/L (ref 38–126)
Anion gap: 13 (ref 5–15)
BUN: 32 mg/dL — ABNORMAL HIGH (ref 8–23)
CO2: 29 mmol/L (ref 22–32)
Calcium: 8.5 mg/dL — ABNORMAL LOW (ref 8.9–10.3)
Chloride: 96 mmol/L — ABNORMAL LOW (ref 98–111)
Creatinine, Ser: 5 mg/dL — ABNORMAL HIGH (ref 0.44–1.00)
GFR, Estimated: 9 mL/min — ABNORMAL LOW (ref >60)
Glucose, Bld: 200 mg/dL — ABNORMAL HIGH (ref 70–99)
Potassium: 4.1 mmol/L (ref 3.5–5.1)
Sodium: 138 mmol/L (ref 135–145)
Total Bilirubin: 0.6 mg/dL (ref 0.0–1.2)
Total Protein: 6.2 g/dL — ABNORMAL LOW (ref 6.5–8.1)

## 2023-09-27 LAB — MAGNESIUM: Magnesium: 2.3 mg/dL (ref 1.7–2.4)

## 2023-09-27 MED ORDER — CEPHALEXIN 500 MG PO CAPS: 500.0000 mg | ORAL_CAPSULE | Freq: Two times a day (BID) | ORAL | 0 refills | Status: DC

## 2023-09-27 MED ORDER — HYDROCODONE-ACETAMINOPHEN 5-325 MG PO TABS
1.0000 | ORAL_TABLET | Freq: Once | ORAL | Status: AC
  Administered 2023-09-27: 1 via ORAL
  Filled 2023-09-27: qty 1

## 2023-09-27 NOTE — Discharge Instructions (Addendum)
 The xray and CT scan of your knee did not show any fractures but does show soft tissue injury.  The ultrasound of your right leg did not show a DVT.  Your blood work was at your baseline.  Please take the antibiotics as prescribed.  You can take 650 mg of Tylenol  every 6 hours as needed for pain.  You can also use ice, heat and topical pain relievers.  You can perform activities as tolerated.   The EKG did show that you have some QT prolongation, this is likely a side effect from the seizure medication, Keppra . Please follow up with your PCP to have this checked again and possibly adjust the dosage of your medications.

## 2023-09-27 NOTE — ED Provider Notes (Signed)
 [facility] Provider Note    Event Date/Time   First MD Initiated Contact with Patient 09/27/23 1719     (approximate)   History   Fall   HPI  Nina Johnston is a 63 y.o. female with PMH of hypertension, COPD, ESRD on dialysis MWF, seizures, CHF presents for evaluation after a fall on Friday. Patient was at her grandson's graduation when she began to feel very dizzy, causing her to fall forward and land on her right knee. Patient did not hit her head as a family member caught her. She felt fine initially and did not want to come to the ED, but with worsening pain, bruising and swelling to the knee, her family was able to convince her to come. She states she has had dizzy spells before and describes it as a room spinning sensation. She states she did not fully syncopize and does not feel dizzy now. She notes that this happens when her BP drops too low.  Last dialysis appointment was Saturday as she did a make up treatment for Friday.      Physical Exam   Triage Vital Signs: ED Triage Vitals  Encounter Vitals Group     BP 09/27/23 1726 (!) 181/66     Systolic BP Percentile --      Diastolic BP Percentile --      Pulse Rate 09/27/23 1726 86     Resp 09/27/23 1726 18     Temp 09/27/23 1726 98.5 F (36.9 C)     Temp Source 09/27/23 1726 Oral     SpO2 09/27/23 1726 95 %     Weight 09/27/23 1722 187 lb (84.8 kg)     Height 09/27/23 1722 5\' 2"  (1.575 m)     Head Circumference --      Peak Flow --      Pain Score 09/27/23 1722 6     Pain Loc --      Pain Education --      Exclude from Growth Chart --     Most recent vital signs: Vitals:   09/27/23 1726 09/27/23 1959  BP: (!) 181/66 (!) 165/62  Pulse: 86 71  Resp: 18 18  Temp: 98.5 F (36.9 C) 98.5 F (36.9 C)  SpO2: 95% 91%   General: Awake, no distress.  CV:  Good peripheral perfusion.  Resp:  Normal effort.  Abd:  No distention.  Right leg: Right knee is swollen with bruising, very  tender to palpation. There is a scabbed abrasion just distal to the knee. ROM is limited due to her pain. Erythema and 1+ pitting edema to the lower shin. Skin is hot to touch. Dorsalis pedis pulse is 2+ and regular.    ED Results / Procedures / Treatments   Labs (all labs ordered are listed, but only abnormal results are displayed) Labs Reviewed  COMPREHENSIVE METABOLIC PANEL WITH GFR - Abnormal; Notable for the following components:      Result Value   Chloride 96 (*)    Glucose, Bld 200 (*)    BUN 32 (*)    Creatinine, Ser 5.00 (*)    Calcium 8.5 (*)    Total Protein 6.2 (*)    Albumin 3.4 (*)    GFR, Estimated 9 (*)    All other components within normal limits  CBC WITH DIFFERENTIAL/PLATELET - Abnormal; Notable for the following components:   RBC 2.34 (*)    Hemoglobin 8.4 (*)    HCT 25.3 (*)  MCV 108.1 (*)    MCH 35.9 (*)    Platelets 96 (*)    Abs Immature Granulocytes 0.12 (*)    All other components within normal limits  BRAIN NATRIURETIC PEPTIDE - Abnormal; Notable for the following components:   B Natriuretic Peptide 671.1 (*)    All other components within normal limits  MAGNESIUM  TROPONIN I (HIGH SENSITIVITY)  TROPONIN I (HIGH SENSITIVITY)     EKG  ED provider interpretation: Normal sinus rhythm with QT prolongation.  Repeat EKG is unchanged and still shows QT prolongation.   RADIOLOGY  Right knee xray, right tib/fibula xray, CT head, CT right knee and right lower extremity US  obtained. I interpreted the images as well as reviewed the radiologist report.   Right knee and tib/fiub xray was concerning for tibial plataeu fracture. Also had a small joint effusion and soft tissue swelling. CT knee did not find a fracture.    CT head was negative for any acute abnormalities.   RLE US  was negative for DVT.   PROCEDURES:  Critical Care performed: No  Procedures   MEDICATIONS ORDERED IN ED: Medications  HYDROcodone -acetaminophen  (NORCO/VICODIN)  5-325 MG per tablet 1 tablet (1 tablet Oral Given 09/27/23 1834)     IMPRESSION / MDM / ASSESSMENT AND PLAN / ED COURSE  I reviewed the triage vital signs and the nursing notes.                             63 year old female presents for evaluation of knee pain after a fall. BP is elevated but patient does have a history of hypertension.   Differential diagnosis includes, but is not limited to, fracture, contusion, muscle strain, cellulitis, vertigo, vasovagal presyncope, electrolyte abnormality, arrhythmia.  Patient's presentation is most consistent with acute presentation with potential threat to life or bodily function.  Patient denies symptoms of hypertensive emergency including chest pain, SOB, blurry vision. Patient does endorse a headache.   Given that patient's fall was caused by a presyncopal event, we will obtained EKG and labs. Will also get xrays to evaluate the right knee pain. Patient does have some erythema, warmth and swelling in the lower right leg concerning for cellulitis. Will start patient on antibiotics for this.   Patient was given norco for pain while in the ED which she reported improved her symptoms.   Clinical Course as of 09/27/23 2333  Paulene Boron Sep 27, 2023  1610 CT Head Wo Contrast No acute abnormalities. Chronic microvascular changes.  [LD]  1855 DG Knee Complete 4 Views Right Findings concerning for a tibial plateau fracture, CT recommended. Also note a joint effusion with generalized soft tissue swelling.  [LD]  1857 ED EKG EKG shows NSR with QT prolongation. [LD]  2019 CT Knee Right Wo Contrast Negative for tibial plateau fracture.  [LD]  2020 US  Venous Img Lower Right (DVT Study) US  obtained to rule out DVT given patient's unilateral swelling. Negative for DVT. [LD]  2024 Comprehensive metabolic panel(!) Unremarkable at patient's baseline. [LD]  2024 Unremarkable, at patient's  [LD]  2024 Brain natriuretic peptide(!) Elevated, but consistent with  patient's baseline. [LD]  2025 CBC with Differential(!) Notable for anemia but consistent with patient's baseline. [LD]  2025 Troponin I (High Sensitivity) Not elevated. [LD]  2026 BP(!): 165/62 BP and pain have improved. [LD]  2027 Magnesium WNL. [LD]  2058 ED EKG Repeat EKG still shows QT prolongation. Since patient does not have electrolyte  abnormalities, suspect this is from her Keppra . Advised patient to follow up with her PCP to have this rechecked and possibly adjust the dose of her Keppra .   She does not have any dizziness at this time. [LD]  2100 Patient was updated on all of the results, we discussed pain management with tylenol  at home as well as icing. Patient and family voiced understanding, all questions were answered and she was stable at discharge. [LD]    Clinical Course User Index [LD] Phyliss Breen, PA-C     FINAL CLINICAL IMPRESSION(S) / ED DIAGNOSES   Final diagnoses:  Acute pain of right knee  Left leg cellulitis     Rx / DC Orders   ED Discharge Orders          Ordered    cephALEXin  (KEFLEX ) 500 MG capsule  2 times daily        09/27/23 2103             Note:  This document was prepared using Dragon voice recognition software and may include unintentional dictation errors.   Phyliss Breen, PA-C 09/27/23 2333    Lubertha Rush, MD 09/28/23 [phone]

## 2023-09-27 NOTE — ED Triage Notes (Addendum)
 Pt via [facility] from home. She had a fall at a graduation ceremony on Friday and then went home; pt says she got dizzy and lost her balance. Prior hx falls as well. Family tried to talk her into getting checked out at that time but she refused. Now she has more bruising and is tender to palpation at right knee and lower leg. Pt is ambulatory with a walker but notes increased difficulty. Pt denies thinners and is a dialysis pt M/W/F with left fistula. No LOC. Right knee pain rated 6/10 with obvious bruising and swelling noted.   BP 190/86 hx of same HR 68  O2 100% RA RR 16

## 2023-09-27 NOTE — ED Notes (Signed)
 Patient transported to CT

## 2023-09-30 ENCOUNTER — Telehealth: Payer: Self-pay | Admitting: Neurology

## 2023-09-30 NOTE — Telephone Encounter (Signed)
 Patient;s mother reschedule due to scheduling conflict with another appointment

## 2023-10-01 ENCOUNTER — Ambulatory Visit: Payer: Medicare Other | Admitting: Neurology

## 2023-10-03 ENCOUNTER — Other Ambulatory Visit: Payer: Self-pay

## 2023-10-03 ENCOUNTER — Observation Stay
Admission: EM | Admit: 2023-10-03 | Discharge: 2023-10-04 | Disposition: A | Discharge status: Home / Self Care | Attending: Emergency Medicine | Admitting: Emergency Medicine

## 2023-10-03 ENCOUNTER — Emergency Department

## 2023-10-03 DIAGNOSIS — G40909 Epilepsy, unspecified, not intractable, without status epilepticus: Secondary | ICD-10-CM | POA: Insufficient documentation

## 2023-10-03 DIAGNOSIS — Z7982 Long term (current) use of aspirin: Secondary | ICD-10-CM | POA: Insufficient documentation

## 2023-10-03 DIAGNOSIS — D631 Anemia in chronic kidney disease: Secondary | ICD-10-CM | POA: Insufficient documentation

## 2023-10-03 DIAGNOSIS — E039 Hypothyroidism, unspecified: Secondary | ICD-10-CM | POA: Insufficient documentation

## 2023-10-03 DIAGNOSIS — Z794 Long term (current) use of insulin: Secondary | ICD-10-CM | POA: Insufficient documentation

## 2023-10-03 DIAGNOSIS — I2510 Atherosclerotic heart disease of native coronary artery without angina pectoris: Secondary | ICD-10-CM | POA: Diagnosis not present

## 2023-10-03 DIAGNOSIS — I480 Paroxysmal atrial fibrillation: Secondary | ICD-10-CM | POA: Diagnosis not present

## 2023-10-03 DIAGNOSIS — I471 Supraventricular tachycardia, unspecified: Principal | ICD-10-CM | POA: Insufficient documentation

## 2023-10-03 DIAGNOSIS — R000 Tachycardia, unspecified: Secondary | ICD-10-CM | POA: Diagnosis present

## 2023-10-03 DIAGNOSIS — N186 End stage renal disease: Secondary | ICD-10-CM | POA: Diagnosis not present

## 2023-10-03 DIAGNOSIS — F419 Anxiety disorder, unspecified: Secondary | ICD-10-CM | POA: Diagnosis not present

## 2023-10-03 DIAGNOSIS — G4733 Obstructive sleep apnea (adult) (pediatric): Secondary | ICD-10-CM | POA: Insufficient documentation

## 2023-10-03 DIAGNOSIS — D696 Thrombocytopenia, unspecified: Secondary | ICD-10-CM | POA: Diagnosis present

## 2023-10-03 DIAGNOSIS — L03115 Cellulitis of right lower limb: Secondary | ICD-10-CM | POA: Diagnosis present

## 2023-10-03 DIAGNOSIS — J449 Chronic obstructive pulmonary disease, unspecified: Secondary | ICD-10-CM | POA: Diagnosis not present

## 2023-10-03 DIAGNOSIS — E1122 Type 2 diabetes mellitus with diabetic chronic kidney disease: Secondary | ICD-10-CM | POA: Insufficient documentation

## 2023-10-03 DIAGNOSIS — Z79899 Other long term (current) drug therapy: Secondary | ICD-10-CM | POA: Diagnosis not present

## 2023-10-03 DIAGNOSIS — I5032 Chronic diastolic (congestive) heart failure: Secondary | ICD-10-CM | POA: Diagnosis not present

## 2023-10-03 DIAGNOSIS — Z992 Dependence on renal dialysis: Secondary | ICD-10-CM | POA: Insufficient documentation

## 2023-10-03 DIAGNOSIS — F319 Bipolar disorder, unspecified: Secondary | ICD-10-CM | POA: Insufficient documentation

## 2023-10-03 DIAGNOSIS — E876 Hypokalemia: Secondary | ICD-10-CM | POA: Diagnosis not present

## 2023-10-03 DIAGNOSIS — I10 Essential (primary) hypertension: Secondary | ICD-10-CM | POA: Diagnosis present

## 2023-10-03 DIAGNOSIS — J45909 Unspecified asthma, uncomplicated: Secondary | ICD-10-CM | POA: Insufficient documentation

## 2023-10-03 DIAGNOSIS — E669 Obesity, unspecified: Secondary | ICD-10-CM | POA: Insufficient documentation

## 2023-10-03 DIAGNOSIS — I959 Hypotension, unspecified: Secondary | ICD-10-CM | POA: Diagnosis not present

## 2023-10-03 DIAGNOSIS — I132 Hypertensive heart and chronic kidney disease with heart failure and with stage 5 chronic kidney disease, or end stage renal disease: Secondary | ICD-10-CM | POA: Diagnosis not present

## 2023-10-03 DIAGNOSIS — Z6830 Body mass index (BMI) 30.0-30.9, adult: Secondary | ICD-10-CM | POA: Insufficient documentation

## 2023-10-03 LAB — BASIC METABOLIC PANEL WITH GFR
Anion gap: 13 (ref 5–15)
BUN: 10 mg/dL (ref 8–23)
CO2: 29 mmol/L (ref 22–32)
Calcium: 8.7 mg/dL — ABNORMAL LOW (ref 8.9–10.3)
Chloride: 96 mmol/L — ABNORMAL LOW (ref 98–111)
Creatinine, Ser: 2.14 mg/dL — ABNORMAL HIGH (ref 0.44–1.00)
GFR, Estimated: 25 mL/min — ABNORMAL LOW (ref >60)
Glucose, Bld: 158 mg/dL — ABNORMAL HIGH (ref 70–99)
Potassium: 3.2 mmol/L — ABNORMAL LOW (ref 3.5–5.1)
Sodium: 138 mmol/L (ref 135–145)

## 2023-10-03 LAB — CBC
HCT: 27.1 % — ABNORMAL LOW (ref 36.0–46.0)
Hemoglobin: 9 g/dL — ABNORMAL LOW (ref 12.0–15.0)
MCH: 35 pg — ABNORMAL HIGH (ref 26.0–34.0)
MCHC: 33.2 g/dL (ref 30.0–36.0)
MCV: 105.4 fL — ABNORMAL HIGH (ref 80.0–100.0)
Platelets: 121 10*3/uL — ABNORMAL LOW (ref 150–400)
RBC: 2.57 MIL/uL — ABNORMAL LOW (ref 3.87–5.11)
RDW: 13.6 % (ref 11.5–15.5)
WBC: 10.1 10*3/uL (ref 4.0–10.5)
nRBC: 0 % (ref 0.0–0.2)

## 2023-10-03 LAB — TROPONIN I (HIGH SENSITIVITY)
Troponin I (High Sensitivity): 9 ng/L (ref <18)
Troponin I (High Sensitivity): 12 ng/L (ref <18)

## 2023-10-03 MED ORDER — FEBUXOSTAT 40 MG PO TABS
80.0000 mg | ORAL_TABLET | Freq: Every day | ORAL | Status: DC
  Administered 2023-10-04: 80 mg via ORAL
  Filled 2023-10-03: qty 2

## 2023-10-03 MED ORDER — SODIUM CHLORIDE 0.9 % IV BOLUS
200.0000 mL | Freq: Once | INTRAVENOUS | Status: AC
  Administered 2023-10-04: 200 mL via INTRAVENOUS

## 2023-10-03 MED ORDER — HEPARIN SODIUM (PORCINE) 5000 UNIT/ML IJ SOLN
5000.0000 [IU] | Freq: Three times a day (TID) | INTRAMUSCULAR | Status: DC
  Administered 2023-10-04 (×2): 5000 [IU] via SUBCUTANEOUS
  Filled 2023-10-03 (×2): qty 1

## 2023-10-03 MED ORDER — ACETAMINOPHEN 325 MG PO TABS: 650.0000 mg | ORAL_TABLET | Freq: Four times a day (QID) | ORAL | Status: DC | PRN

## 2023-10-03 MED ORDER — RENA-VITE PO TABS
1.0000 | ORAL_TABLET | Freq: Every day | ORAL | Status: DC
  Administered 2023-10-04: 1 via ORAL
  Filled 2023-10-03: qty 1

## 2023-10-03 MED ORDER — CEFUROXIME AXETIL 250 MG PO TABS
250.0000 mg | ORAL_TABLET | Freq: Two times a day (BID) | ORAL | Status: DC
  Administered 2023-10-04 (×2): 250 mg via ORAL
  Filled 2023-10-03 (×3): qty 1

## 2023-10-03 MED ORDER — FAMOTIDINE 20 MG PO TABS: 40.0000 mg | ORAL_TABLET | Freq: Every day | ORAL | Status: DC

## 2023-10-03 MED ORDER — PANTOPRAZOLE SODIUM 40 MG PO TBEC
40.0000 mg | DELAYED_RELEASE_TABLET | Freq: Every day | ORAL | Status: DC
  Administered 2023-10-04: 40 mg via ORAL
  Filled 2023-10-03: qty 1

## 2023-10-03 MED ORDER — ONDANSETRON HCL 4 MG/2ML IJ SOLN: 4.0000 mg | Freq: Three times a day (TID) | INTRAMUSCULAR | Status: DC | PRN

## 2023-10-03 MED ORDER — LAMOTRIGINE 100 MG PO TABS: 300.0000 mg | ORAL_TABLET | Freq: Two times a day (BID) | ORAL | Status: DC

## 2023-10-03 MED ORDER — INSULIN GLARGINE-YFGN 100 UNIT/ML ~~LOC~~ SOLN
10.0000 [IU] | Freq: Every day | SUBCUTANEOUS | Status: DC
  Administered 2023-10-04: 10 [IU] via SUBCUTANEOUS
  Filled 2023-10-03 (×2): qty 0.1

## 2023-10-03 MED ORDER — FLUTICASONE FUROATE-VILANTEROL 200-25 MCG/ACT IN AEPB
1.0000 | INHALATION_SPRAY | Freq: Every day | RESPIRATORY_TRACT | Status: DC
  Administered 2023-10-04: 1 via RESPIRATORY_TRACT
  Filled 2023-10-03: qty 28

## 2023-10-03 MED ORDER — ETHOSUXIMIDE 250 MG/5ML PO SOLN: 375.0000 mg | Freq: Two times a day (BID) | ORAL | Status: DC

## 2023-10-03 MED ORDER — INSULIN ASPART 100 UNIT/ML IJ SOLN
0.0000 [IU] | Freq: Three times a day (TID) | INTRAMUSCULAR | Status: DC
  Administered 2023-10-04 (×2): 3 [IU] via SUBCUTANEOUS
  Administered 2023-10-04: 2 [IU] via SUBCUTANEOUS
  Filled 2023-10-03 (×3): qty 1

## 2023-10-03 MED ORDER — MONTELUKAST SODIUM 10 MG PO TABS
10.0000 mg | ORAL_TABLET | Freq: Every day | ORAL | Status: DC
  Administered 2023-10-04: 10 mg via ORAL
  Filled 2023-10-03: qty 1

## 2023-10-03 MED ORDER — INSULIN ASPART 100 UNIT/ML IJ SOLN: 0.0000 [IU] | Freq: Every day | INTRAMUSCULAR | Status: DC

## 2023-10-03 MED ORDER — LORAZEPAM 2 MG/ML IJ SOLN: 2.0000 mg | INTRAMUSCULAR | Status: DC | PRN

## 2023-10-03 MED ORDER — LORATADINE 10 MG PO TABS
10.0000 mg | ORAL_TABLET | Freq: Every day | ORAL | Status: DC
  Administered 2023-10-04: 10 mg via ORAL
  Filled 2023-10-03: qty 1

## 2023-10-03 MED ORDER — ALBUTEROL SULFATE HFA 108 (90 BASE) MCG/ACT IN AERS: 2.0000 | INHALATION_SPRAY | RESPIRATORY_TRACT | Status: DC | PRN

## 2023-10-03 MED ORDER — LEVETIRACETAM 250 MG PO TABS: 250.0000 mg | ORAL_TABLET | Freq: Two times a day (BID) | ORAL | Status: DC

## 2023-10-03 MED ORDER — CEFUROXIME AXETIL 500 MG PO TABS: 500.0000 mg | ORAL_TABLET | Freq: Two times a day (BID) | ORAL | Status: DC

## 2023-10-03 MED ORDER — DILTIAZEM HCL 25 MG/5ML IV SOLN
10.0000 mg | Freq: Once | INTRAVENOUS | Status: DC
  Filled 2023-10-03: qty 5

## 2023-10-03 MED ORDER — DILTIAZEM HCL 25 MG/5ML IV SOLN
5.0000 mg | Freq: Once | INTRAVENOUS | Status: AC
  Administered 2023-10-03: 5 mg via INTRAVENOUS

## 2023-10-03 MED ORDER — METOPROLOL SUCCINATE ER 100 MG PO TB24
100.0000 mg | ORAL_TABLET | Freq: Two times a day (BID) | ORAL | Status: DC
  Administered 2023-10-04 (×2): 100 mg via ORAL
  Filled 2023-10-03 (×2): qty 1

## 2023-10-03 MED ORDER — ALBUTEROL SULFATE (2.5 MG/3ML) 0.083% IN NEBU: 2.5000 mg | INHALATION_SOLUTION | RESPIRATORY_TRACT | Status: DC | PRN

## 2023-10-03 MED ORDER — DM-GUAIFENESIN ER 30-600 MG PO TB12: 1.0000 | ORAL_TABLET | Freq: Two times a day (BID) | ORAL | Status: DC | PRN

## 2023-10-03 MED ORDER — LEVOTHYROXINE SODIUM 50 MCG PO TABS
150.0000 ug | ORAL_TABLET | Freq: Every day | ORAL | Status: DC
  Administered 2023-10-04: 150 ug via ORAL
  Filled 2023-10-03: qty 1

## 2023-10-03 MED ORDER — RISAQUAD PO CAPS
1.0000 | ORAL_CAPSULE | Freq: Every day | ORAL | Status: DC
  Administered 2023-10-04: 1 via ORAL
  Filled 2023-10-03: qty 1

## 2023-10-03 MED ORDER — DILTIAZEM HCL-DEXTROSE 125-5 MG/125ML-% IV SOLN (PREMIX)
5.0000 mg/h | INTRAVENOUS | Status: DC
  Administered 2023-10-03: 5 mg/h via INTRAVENOUS
  Filled 2023-10-03: qty 125

## 2023-10-03 MED ORDER — CLONAZEPAM 1 MG PO TABS
1.0000 mg | ORAL_TABLET | Freq: Every day | ORAL | Status: DC
  Administered 2023-10-04: 1 mg via ORAL
  Filled 2023-10-03: qty 1

## 2023-10-03 MED ORDER — HYDRALAZINE HCL 20 MG/ML IJ SOLN: 5.0000 mg | INTRAMUSCULAR | Status: DC | PRN

## 2023-10-03 NOTE — ED Triage Notes (Signed)
 Pt presents to ER via EMS from Dialysis center. Pt completed her dialysis session and per EMS pt had drastic drop in BP from 132/62 to 62/21, 86/51, in route BP improved some to 99/52, HR increased from 86bpm to 135bpm and stayed consistent. Pt reports chest pain and some shortness of breath, pt also has a fall back on 05/23 bruising noted to left lower leg.

## 2023-10-03 NOTE — ED Notes (Signed)
Sent rainbow to lab.

## 2023-10-03 NOTE — H&P (Signed)
 History and Physical    Nina Johnston WGN:[id number] DOB: 10-19-1960 DOA: 10/03/2023  Referring MD/NP/PA:   PCP: Nina Helms, MD   Patient coming from:  The patient is coming from home.     Chief Complaint: hypotension, dizziness   HPI: Nina Johnston is a 63 y.o. female with medical history significant of or ESRD on HD, seizure disorder on AEDs, DM, HTN, bipolar mood disorder, dCHF, SDH, PAF/Flutter not on AC, hypothyroidism, gout, anxiety, bipolar, anemia, thrombocytopenia, COPD on 2 L oxygen, left knee cellulitis, who presents with hypotension, dizziness.  Patient is brought to ED via EMS from Dialysis center. She completed a full course of dialysis session with 2.3L of fluid removed today. She developed hypotension with blood pressure dropped from 132/62 to 62/21 --> then to 86/51, in route BP improved some to 99/52, HR increased from 86 bpm to135 bpm and stayed consistent. She had dizziness, chest discomfort and mild shortness of breath which have all resolved when I saw pt in ED, but her HR is still in 130s. Her SBP is 100-110s. EKG showed SVT with heart rates of 136 in ED. Patient does not have nausea, vomiting, diarrhea or abdominal pain.  No symptoms of UTI.    Of note, patient had fall with right knee injury.  Patient was seen in the ED on 5/25 and had CT scan of right knee which was negative for bony fracture.  Patient was started on Keflex  for possible right knee cellulitis from 5/25 to 5/30, then changed to cefdinir from 5/30 to 6/6, and Levaquin from 5/28 to 5/31. She has mild right knee pain with ecchymosis over her right knee. No fever or chills.  Data reviewed independently and ED Course: pt was found to have WBC 10.1, potassium 3.2, bicarbonate 29, creatinine 2.14, BUN 10, GFR 25, troponin 12 --> 9.  Chest x-ray showed cardiomegaly and central vascular congestion.  Patient is placed in PCU for observation.   EKG: I have personally reviewed.  SVT, heart rate of 136,  borderline left axis deviation.   Review of Systems:   General: no fevers, chills, no body weight gain, has fatigue HEENT: no blurry vision, hearing changes or sore throat Respiratory: currently no dyspnea, coughing, wheezing CV: no chest pain, no palpitations GI: no nausea, vomiting, abdominal pain, diarrhea, constipation GU: no dysuria, burning on urination, increased urinary frequency, hematuria  Ext: has leg edema Neuro: no unilateral weakness, numbness, or tingling, no vision change or hearing loss. Had dizziness Skin: has bruise over right knee MSK: No muscle spasm, no deformity, no limitation of range of movement in spin Heme: No easy bruising.  Travel history: No recent long distant travel.   Allergy:  Allergies  Allergen Reactions   Cinnamon Other (See Comments)    Raises blood pressure   Procrit [Epoetin Alfa-Epbx] Other (See Comments)    Seizures, grand mal   Ace Inhibitors Cough   Azithromycin Other (See Comments)    seizures   Iron  Other (See Comments)    Can not take in IV Form - causes seizures    Neomycin -Bacitracin  Zn-Polymyx Other (See Comments)    Unknown   Other    Sulfa Antibiotics Other (See Comments)    Due to risks for seizures.    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  On multiple psychotropic medications, and with DM>    Benign essential HTN 12/26/2014   Bipolar disorder (HCC)    Bronchitis    Carotid artery narrowing 12/22/2014   Cervical prolapse    CHF (congestive heart failure) (HCC)    CKD (chronic kidney disease)    Concussion 01/14/2014   Fall    COPD (chronic obstructive pulmonary disease) (HCC)    Coronary artery disease    Depression    Diabetes mellitus without complication (HCC)    Dialysis patient  (HCC)    Diplopia    Dupuytren's contracture of foot    Essential (primary) hypertension 02/12/2015   Family history of thyroid  problem    GERD (gastroesophageal reflux disease)    Gout    Heart disease    enlarged because of COPD   Heart murmur    Hepatomegaly    HLD (hyperlipidemia)    Hyperactive airway disease    Hyperkalemia    Hypertension    Hypothyroidism    Irritable colon    Morbid obesity (HCC)    Multiple thyroid  nodules    OAB (overactive bladder)    Pernicious anemia    Post menopausal syndrome    Post-concussion syndrome 03/08/2014   Post-traumatic brain syndrome    Prolapsed uterus    PSVT (paroxysmal supraventricular tachycardia) (HCC)    Renal calculus    Renal disease    w/ GFR 29-may be due to diabetes   Seizures (HCC)    last seizure 2012. has had spells since with no knowledge. 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    Transient alteration of awareness 11/10/2012   Urinary incontinence with continuous leakage 03/08/2014    Past Surgical History:  Procedure Laterality Date   A/V FISTULAGRAM Left 07/21/2016   Procedure: A/V Fistulagram;  Surgeon: Celso College, MD;  Location: [facility];  Service: Cardiovascular;  Laterality: Left;   A/V FISTULAGRAM Left 12/24/2016   Procedure: A/V Fistulagram;  Surgeon: Celso College, MD;  Location: [facility];  Service: Cardiovascular;  Laterality: Left;   A/V FISTULAGRAM Left 06/29/2017   Procedure: A/V FISTULAGRAM;  Surgeon: Celso College, MD;  Location: [facility];  Service: Cardiovascular;  Laterality: Left;   A/V FISTULAGRAM Left 03/08/2018   Procedure: A/V FISTULAGRAM;  Surgeon: Celso College, MD;  Location: [facility];  Service: Cardiovascular;  Laterality: Left;   A/V FISTULAGRAM Left 04/11/2019   Procedure: A/V FISTULAGRAM;  Surgeon: Celso College, MD;  Location: [facility];  Service:  Cardiovascular;  Laterality: Left;   A/V FISTULAGRAM Left 06/25/2023   Procedure: A/V Fistulagram;  Surgeon: Celso College, MD;  Location: [facility];  Service: Cardiovascular;  Laterality: Left;   A/V SHUNT INTERVENTION N/A 07/21/2016   Procedure: A/V Shunt Intervention;  Surgeon: Celso College, MD;  Location: [facility];  Service: Cardiovascular;  Laterality: N/A;   A/V SHUNT INTERVENTION N/A 12/24/2016   Procedure: A/V SHUNT INTERVENTION;  Surgeon: Celso College, MD;  Location: [facility];  Service: Cardiovascular;  Laterality: N/A;   AVF     BIOPSY  02/23/2023   Procedure: BIOPSY;  Surgeon: Shane Darling, MD;  Location: [facility];  Service: Endoscopy;;  BREAST BIOPSY Left 06/12/2022   LEFT stereo bx, calcs, "X" clip-BENIGN FIBROFATTY BREAST TISSUE WITH FOCAL FATTY DEGENERATION AND ASSOCIATED COARSE DYSTROPHIC CALCIFICATIONS. - NEGATIVE FOR ATYPICAL PROLIFERATIVE BREAST DISEASE.   BREAST BIOPSY Left 06/12/2022   MM LT BREAST BX W LOC DEV 1ST LESION IMAGE BX SPEC STEREO GUIDE 06/12/2022 [facility]   CHOLECYSTECTOMY     COLONOSCOPY     COLONOSCOPY WITH PROPOFOL  N/A 03/18/2017   Procedure: COLONOSCOPY WITH PROPOFOL ;  Surgeon: Cassie Click, MD;  Location: [facility];  Service: Endoscopy;  Laterality: N/A;   COLONOSCOPY WITH PROPOFOL  N/A 02/23/2023   Procedure: COLONOSCOPY WITH PROPOFOL ;  Surgeon: Shane Darling, MD;  Location: [facility];  Service: Endoscopy;  Laterality: N/A;  DM & DIALYSIS PATIENT   ENDOMETRIAL BIOPSY     ablation, uterine   ESOPHAGEAL BRUSHING  02/23/2023   Procedure: ESOPHAGEAL BRUSHING;  Surgeon: Shane Darling, MD;  Location: [facility];  Service: Endoscopy;;   ESOPHAGOGASTRODUODENOSCOPY (EGD) WITH PROPOFOL  N/A 03/18/2017   Procedure: ESOPHAGOGASTRODUODENOSCOPY (EGD) WITH PROPOFOL ;  Surgeon: Cassie Click, MD;  Location: [facility] ENDOSCOPY;  Service: Endoscopy;  Laterality: N/A;    ESOPHAGOGASTRODUODENOSCOPY (EGD) WITH PROPOFOL  N/A 02/23/2023   Procedure: ESOPHAGOGASTRODUODENOSCOPY (EGD) WITH PROPOFOL ;  Surgeon: Shane Darling, MD;  Location: [facility];  Service: Endoscopy;  Laterality: N/A;   HERNIA REPAIR     HERNIA REPAIR     PERIPHERAL VASCULAR CATHETERIZATION N/A 08/23/2015   Procedure: Dialysis/Perma Catheter Insertion;  Surgeon: Celso College, MD;  Location: [facility];  Service: Cardiovascular;  Laterality: N/A;   PERIPHERAL VASCULAR CATHETERIZATION N/A 11/09/2015   Procedure: Dialysis/Perma Catheter Removal;  Surgeon: Jackquelyn Mass, MD;  Location: [facility];  Service: Cardiovascular;  Laterality: N/A;   PERIPHERAL VASCULAR CATHETERIZATION Left 04/23/2016   Procedure: A/V Fistulagram;  Surgeon: Celso College, MD;  Location: [facility];  Service: Cardiovascular;  Laterality: Left;   PORT A CATH REVISION      Social History:  reports that she has never smoked. She has never used smokeless tobacco. She reports that she does not drink alcohol and does not use drugs.  Family History:  Family History  Problem Relation Age of Onset   Diabetes Mother    Lumbar disc disease Mother    Migraines Mother    Hypertension Mother    Cancer - Prostate Father        mild   Diabetes Father    Hypertension Father    Breast cancer Sister 38   Ovarian cancer Neg Hx    Colon cancer Neg Hx    Heart disease Neg Hx    Kidney cancer Neg Hx    Bladder Cancer Neg Hx      Prior to Admission medications   Medication Sig Start Date End Date Taking? Authorizing Provider  aspirin EC 81 MG tablet Take 81 mg by mouth every other day. Swallow whole. Patient not taking: Reported on 06/25/2023    [provider]  AURYXIA  1 GM 210 MG(Fe) tablet Take 630 mg by mouth 3 (three) times daily. Patient not taking: Reported on 06/25/2023 07/23/22   [provider]  B Complex-C-Folic Acid  (DIALYVITE TABLET) TABS Take 1 tablet by mouth  daily. 11/14/17   [provider]  cephALEXin  (KEFLEX ) 500 MG capsule Take 1 capsule (500 mg total) by mouth 2 (two) times daily for 7 days. 09/27/23 10/04/23  Monda Angry A, PA-C  clonazePAM  (KLONOPIN ) 1 MG tablet TAKE 1-2 TABLETS (1-2  MG TOTAL) BY MOUTH DAILY. Patient taking differently: Take 1 mg by mouth daily. 05/25/23   Dohmeier, Raoul Byes, MD  cloNIDine (CATAPRES - DOSED IN MG/24 HR) 0.2 mg/24hr patch Place 0.2 mg onto the skin once a week.    [provider]  Continuous Blood Gluc Sensor (DEXCOM G6 SENSOR) MISC Use 1 every 10 days 07/31/20   [provider]  Continuous Blood Gluc Transmit (DEXCOM G6 TRANSMITTER) MISC USE EVERY 3 MONTHS 11/06/20   [provider]  ethosuximide  (ZARONTIN ) 250 MG/5ML solution TAKE 7. 5 ML BY MOUTH TWICE A DAY TAKE AFTER HD TREATMENT ON DIALYSIS DAY. 09/09/23   Dohmeier, Raoul Byes, MD  famotidine  (PEPCID ) 40 MG tablet SMARTSIG:1 Tablet(s) By Mouth Every Evening 10/04/20   [provider]  Febuxostat  80 MG TABS Take 80 mg by mouth daily.    [provider]  fexofenadine (ALLEGRA) 180 MG tablet Take 180 mg by mouth daily.    [provider]  furosemide  (LASIX ) 40 MG tablet Take 60 mg by mouth 2 (two) times daily. TAKE ONE AND ONE-HALF TABLETS TWICE DAILY 03/15/23   [provider]  Glucagon, rDNA, (GLUCAGON EMERGENCY) 1 MG KIT INJECT 1 MG INTO THE MUSCLE AS DIRECTED 10/30/22   [provider]  insulin  glargine (LANTUS ) 100 UNIT/ML injection Inject 16 Units into the skin at bedtime. 16 units on Monday,Wednesday,Friday, 12 units on dialysis days    [provider]  insulin  lispro (HUMALOG) 100 UNIT/ML injection Inject 8-15 Units into the skin 3 (three) times daily with meals. Per sliding scale    [provider]  LAMICTAL  200 MG tablet TAKE 1.5 TABLETS (300 MG TOTAL) BY MOUTH 2 (TWO) TIMES DAILY. Estell Heller NAME 08/24/23   Dohmeier, Raoul Byes, MD  levETIRAcetam  (KEPPRA ) 250 MG tablet  Take Bid on non -dialysis days, 3 a day on hemodialysis days. 08/12/22   Dohmeier, Raoul Byes, MD  levothyroxine  (SYNTHROID ) 150 MCG tablet Take 150 mcg by mouth daily before breakfast.     [provider]  Methoxy PEG-Epoetin Beta (MIRCERA IJ) Inject as directed. Receives w/ dialysis, unsure how often right now    [provider]  metoprolol  succinate (TOPROL -XL) 100 MG 24 hr tablet TAKE 1 TABLET BY MOUTH IN THE MORNING AND AT BEDTIME WITH OR IMMEDIATELY FOLLOWING A MEAL 09/23/23   Riddle, Suzann, NP  montelukast (SINGULAIR) 10 MG tablet SMARTSIG:1 Tablet(s) By Mouth Every Evening 12/05/20   [provider]  pantoprazole (PROTONIX) 40 MG tablet Take 40 mg by mouth every morning.     [provider]  Probiotic Product (ALIGN) 4 MG CAPS Take 4 mg by mouth daily.     [provider]  UNIFINE PENTIPS 32G X 4 MM MISC USE UP TO 5 TIMES DAILY AS DIRECTED 11/22/17   [provider]  valsartan  (DIOVAN ) 80 MG tablet Take 1 tablet (80 mg total) by mouth daily. 10/01/22   Boyce Byes, MD    Physical Exam: Vitals:   10/03/23 1930 10/03/23 2000 10/03/23 2130 10/03/23 2208  BP: 111/63 115/73 107/71   Pulse: (!) 127 (!) 130 (!) 133   Resp:   (!) 36   Temp:    98.4 F (36.9 C)  TempSrc:    Oral  SpO2: 96% 97% 96%    General: Not in acute distress HEENT:       Eyes: PERRL, EOMI, no jaundice       ENT: No discharge from the ears and nose, no pharynx injection, no tonsillar  enlargement.        Neck: No JVD, no bruit, no mass felt. Heme: No neck lymph node enlargement. Cardiac: S1/S2, RRR, No murmurs, No gallops or rubs. Respiratory: No rales, wheezing, rhonchi or rubs. GI: Soft, nondistended, nontender, no rebound pain, no organomegaly, BS present. GU: No hematuria Ext: 1+ pitting leg edema bilaterally. 1+DP/PT pulse bilaterally. Musculoskeletal: No joint deformities, No joint redness or warmth, no limitation of ROM in spin. Skin: has bruise and  ecchymosis over right knee with a scabbed small area of skin laceration Neuro: Alert, oriented X3, cranial nerves II-XII grossly intact, moves all extremities normally. Psych: Patient is not psychotic, no suicidal or hemocidal ideation.  Labs on Admission: I have personally reviewed following labs and imaging studies  CBC: Recent Labs  Lab 09/27/23 1826 10/03/23 1735  WBC 8.4 10.1  NEUTROABS 5.8  --   HGB 8.4* 9.0*  HCT 25.3* 27.1*  MCV 108.1* 105.4*  PLT 96* 121*   Basic Metabolic Panel: Recent Labs  Lab 09/27/23 1826 10/03/23 1735  NA 138 138  K 4.1 3.2*  CL 96* 96*  CO2 29 29  GLUCOSE 200* 158*  BUN 32* 10  CREATININE 5.00* 2.14*  CALCIUM 8.5* 8.7*  MG 2.3  --    GFR: Estimated Creatinine Clearance: 27.2 mL/min (A) (by C-G formula based on SCr of 2.14 mg/dL (H)). Liver Function Tests: Recent Labs  Lab 09/27/23 1826  AST 21  ALT 17  ALKPHOS 89  BILITOT 0.6  PROT 6.2*  ALBUMIN 3.4*   No results for input(s): "LIPASE", "AMYLASE" in the last 168 hours. No results for input(s): "AMMONIA" in the last 168 hours. Coagulation Profile: No results for input(s): "INR", "PROTIME" in the last 168 hours. Cardiac Enzymes: No results for input(s): "CKTOTAL", "CKMB", "CKMBINDEX", "TROPONINI" in the last 168 hours. BNP (last 3 results) No results for input(s): "PROBNP" in the last 8760 hours. HbA1C: No results for input(s): "HGBA1C" in the last 72 hours. CBG: No results for input(s): "GLUCAP" in the last 168 hours. Lipid Profile: No results for input(s): "CHOL", "HDL", "LDLCALC", "TRIG", "CHOLHDL", "LDLDIRECT" in the last 72 hours. Thyroid  Function Tests: No results for input(s): "TSH", "T4TOTAL", "FREET4", "T3FREE", "THYROIDAB" in the last 72 hours. Anemia Panel: No results for input(s): "VITAMINB12", "FOLATE", "FERRITIN", "TIBC", "IRON ", "RETICCTPCT" in the last 72 hours. Urine analysis:    Component Value Date/Time   COLORURINE YELLOW 02/12/2018 1501    APPEARANCEUR CLOUDY (A) 02/12/2018 1501   APPEARANCEUR Cloudy (A) 01/20/2018 1611   LABSPEC 1.020 02/12/2018 1501   LABSPEC 1.008 07/04/2011 0859   PHURINE 7.0 02/12/2018 1501   GLUCOSEU NEGATIVE 02/12/2018 1501   GLUCOSEU Negative 07/04/2011 0859   HGBUR TRACE (A) 02/12/2018 1501   BILIRUBINUR NEGATIVE 02/12/2018 1501   BILIRUBINUR Negative 01/20/2018 1611   BILIRUBINUR Negative 07/04/2011 0859   KETONESUR NEGATIVE 02/12/2018 1501   PROTEINUR 100 (A) 02/12/2018 1501   UROBILINOGEN 0.2 11/04/2017 1544   NITRITE NEGATIVE 02/12/2018 1501   LEUKOCYTESUR MODERATE (A) 02/12/2018 1501   LEUKOCYTESUR 1+ (A) 01/20/2018 1611   LEUKOCYTESUR Negative 07/04/2011 0859   Sepsis Labs: @LABRCNTIP (procalcitonin:4,lacticidven:4) )No results found for this or any previous visit (from the past 240 hours).   Radiological Exams on Admission:   Assessment/Plan Principal Problem:   SVT (supraventricular tachycardia) (HCC) Active Problems:   PAF (paroxysmal atrial fibrillation) (HCC)   Hypotension   Seizure disorder (HCC)   Hypokalemia   Hypothyroidism   ESRD on dialysis (HCC)   HTN (hypertension)  Chronic diastolic CHF (congestive heart failure) (HCC)   Anemia in ESRD (end-stage renal disease) (HCC)   Thrombocytopenia (HCC)   Obstructive apnea   Bipolar affective disorder (HCC)   Anxiety   Obesity (BMI 30-39.9)   Cellulitis of right knee   Assessment and Plan:   SVT (supraventricular tachycardia) (HCC): Currently patient has SVT with heart rate in the 130s.  Hemodynamically stable.  Current blood pressure with SBP 100-110s.   - Placed in PCU for observation - Patient received Cardizem  5 mg x 2 dose - Started Cardizem  drip - Continue home metoprolol  100 mg twice daily - Check a TSH level  PAF (paroxysmal atrial fibrillation) (HCC): Patient is not taking anticoagulants possibly due to history of SDH -See above  Hypotension: Likely due to volume depletion after dialysis.  Blood  pressure has improved. -Give 200 cc normal saline bolus - Hold clonidine, Diovan   Seizure disorder (HCC) -Seizure precaution - As needed Ativan for seizure - Continue home ethosuximide , Lamictal , Keppra   Hypokalemia: Potassium 3.2 -Will not replete potassium due to ESRD - Check  magnesium level  Hypothyroidism -Continue Synthroid  - Follow-up TSH level  ESRD on dialysis (TTS): -Consulted Dr. Zelda Hickman of renal  HTN (hypertension) -Hold Diovan  and clonidine due to hypotension - Continue metoprolol  for rate control  Chronic diastolic CHF (congestive heart failure) [facility]): Patient has 1+ leg edema, but no SOB.  CHF is compensated.  2D echo on 05/26/2023 showed EF of 55 to 60% with grade 2 diastolic dysfunction. -Volume management per renal with dialysis  Anemia in ESRD (end-stage renal disease) (HCC): Hemoglobin stable 9.0 (8.4 09/27/2023) -Follow-up with CBC  Thrombocytopenia (HCC): This is chronic issue.  Platelet 121, no active bleeding. -Follow-up CBC  Obstructive apnea -CPAP  Bipolar affective disorder (HCC) and Anxiety -Continue home Klonopin   Obesity (BMI 30-39.9): Patient is Obesity Class I, with body weight  84.8Kg and BMI 34.20 kg/m2.  - Encourage losing weight - Exercise and healthy diet  Cellulitis of right knee: No fever or leukocytosis. Patient was started on Keflex  for possible right knee cellulitis from 5/25 to 5/30, then changed to cefdinir from 5/30 to 6/6, and Levaquin from 5/28 to 5/31.  - Switch cefdinir to Ceftin  250 mg twice daily in hospital        DVT ppx: SQ Heparin    Code Status: Full code   Family Communication:    Yes, patient's parents at bed side.       Disposition Plan:  Anticipate discharge back to previous environment  Consults called: Dr. Zelda Hickman of renal  Admission status and Level of care: Progressive:    for obs     Dispo: The patient is from: Home              Anticipated d/c is to: Home              Anticipated d/c date  is: 1 day              Patient currently is not medically stable to d/c.    Severity of Illness:  The appropriate patient status for this patient is OBSERVATION. Observation status is judged to be reasonable and necessary in order to provide the required intensity of service to ensure the patient's safety. The patient's presenting symptoms, physical exam findings, and initial radiographic and laboratory data in the context of their medical condition is felt to place them at decreased risk for further clinical deterioration. Furthermore, it is anticipated that the patient will be medically  stable for discharge from the hospital within 2 midnights of admission.        Date of Service 10/03/2023    Fidencio Hue [facility]   If 7PM-7AM, please contact night-coverage [email or web address] 10/03/2023, 10:40 PM

## 2023-10-03 NOTE — ED Provider Notes (Signed)
 [facility] Provider Note    Event Date/Time   First MD Initiated Contact with Patient 10/03/23 1739     (approximate)   History   Hypotension   HPI  Nina Johnston is a 63 y.o. female who presents to the emergency department today from dialysis via EMS because of concerns for low blood pressure.  Patient states that her blood pressure does sometimes fluctuate during dialysis.  She had been in her normal state of health prior to dialysis today.  Then shortly after finishing she started feeling weak and dizzy.  Noted to have a low blood pressure and fast heart rate.  Per chart review patient was seen about a year ago with similar symptoms.       Physical Exam   Triage Vital Signs: ED Triage Vitals  Encounter Vitals Group     BP 10/03/23 1730 108/81     Systolic BP Percentile --      Diastolic BP Percentile --      Pulse Rate 10/03/23 1730 (!) 137     Resp 10/03/23 1730 20     Temp 10/03/23 1730 97.9 F (36.6 C)     Temp Source 10/03/23 1730 Oral     SpO2 --      Weight --      Height --      Head Circumference --      Peak Flow --      Pain Score 10/03/23 1731 4     Pain Loc --      Pain Education --      Exclude from Growth Chart --     Most recent vital signs: Vitals:   10/03/23 1730  BP: 108/81  Pulse: (!) 137  Resp: 20  Temp: 97.9 F (36.6 C)   General: Awake, alert, oriented. CV:  Good peripheral perfusion. Tachycardia. Resp:  Normal effort. Lungs clear. Abd:  No distention.    ED Results / Procedures / Treatments   Labs (all labs ordered are listed, but only abnormal results are displayed) Labs Reviewed  BASIC METABOLIC PANEL WITH GFR - Abnormal; Notable for the following components:      Result Value   Potassium 3.2 (*)    Chloride 96 (*)    Glucose, Bld 158 (*)    Creatinine, Ser 2.14 (*)    Calcium 8.7 (*)    GFR, Estimated 25 (*)    All other components within normal limits  CBC - Abnormal; Notable for the  following components:   RBC 2.57 (*)    Hemoglobin 9.0 (*)    HCT 27.1 (*)    MCV 105.4 (*)    MCH 35.0 (*)    Platelets 121 (*)    All other components within normal limits  TSH  MAGNESIUM  PHOSPHORUS  BASIC METABOLIC PANEL WITH GFR  CBC  CBG MONITORING, ED  TROPONIN I (HIGH SENSITIVITY)  TROPONIN I (HIGH SENSITIVITY)     EKG  I, Marylynn Soho, attending physician, personally viewed and interpreted this EKG  EKG Time: 1724 Rate: 136 Rhythm: sinus tachycardia, vs svt Axis: normal Intervals: qtc 544 QRS: narrow ST changes: no st elevation Impression: abnormal ekg   RADIOLOGY I independently interpreted and visualized the CXR. My interpretation: cardiomegaly Radiology interpretation:  IMPRESSION:  Stable cardiomegaly with central pulmonary vascular congestion.      PROCEDURES:  Critical Care performed: Yes  CRITICAL CARE Performed by: Marylynn Soho   Total critical care time: 30 minutes  Critical care time was exclusive of separately billable procedures and treating other patients.  Critical care was necessary to treat or prevent imminent or life-threatening deterioration.  Critical care was time spent personally by me on the following activities: development of treatment plan with patient and/or surrogate as well as nursing, discussions with consultants, evaluation of patient's response to treatment, examination of patient, obtaining history from patient or surrogate, ordering and performing treatments and interventions, ordering and review of laboratory studies, ordering and review of radiographic studies, pulse oximetry and re-evaluation of patient's condition.   Procedures    MEDICATIONS ORDERED IN ED: Medications - No data to display   IMPRESSION / MDM / ASSESSMENT AND PLAN / ED COURSE  I reviewed the triage vital signs and the nursing notes.                              Differential diagnosis includes, but is not limited to, arrhythmia,  electrolyte abnormality  Patient's presentation is most consistent with acute presentation with potential threat to life or bodily function.   The patient is on the cardiac monitor to evaluate for evidence of arrhythmia and/or significant heart rate changes.  Patient presented to the emergency department today because of concerns for some dizziness and low blood pressure.  Patient did have low blood pressure here in the emergency department.  Workup showed slightly new potassium which is perhaps not surprising given patient had dialysis earlier today.  EKG was concerning for tachycardia.  Did have concern for possible atrial fibrillation given previous history of the same.  Patient was given 2 small boluses of diltiazem  however did start dropping her pressure little bit.  At this point we will start diltiazem  infusion in hopes that we get better rate control without such significant hypotensive episodes.  Discussed with Dr. Rosalea Collin with the hospitalist service who will evaluate for admission.      FINAL CLINICAL IMPRESSION(S) / ED DIAGNOSES   Final diagnoses:  Tachyarrhythmia        Rx / DC Orders     Note:  This document was prepared using Dragon voice recognition software and may include unintentional dictation errors.    Marylynn Soho, MD 10/03/23 [phone]

## 2023-10-04 ENCOUNTER — Encounter: Payer: Self-pay | Admitting: Internal Medicine

## 2023-10-04 DIAGNOSIS — E669 Obesity, unspecified: Secondary | ICD-10-CM

## 2023-10-04 DIAGNOSIS — I959 Hypotension, unspecified: Secondary | ICD-10-CM | POA: Diagnosis not present

## 2023-10-04 DIAGNOSIS — E876 Hypokalemia: Secondary | ICD-10-CM | POA: Diagnosis not present

## 2023-10-04 DIAGNOSIS — I480 Paroxysmal atrial fibrillation: Secondary | ICD-10-CM | POA: Diagnosis not present

## 2023-10-04 DIAGNOSIS — I471 Supraventricular tachycardia, unspecified: Secondary | ICD-10-CM | POA: Diagnosis not present

## 2023-10-04 DIAGNOSIS — I5032 Chronic diastolic (congestive) heart failure: Secondary | ICD-10-CM

## 2023-10-04 LAB — BASIC METABOLIC PANEL WITH GFR
Anion gap: 11 (ref 5–15)
BUN: 19 mg/dL (ref 8–23)
CO2: 29 mmol/L (ref 22–32)
Calcium: 8.7 mg/dL — ABNORMAL LOW (ref 8.9–10.3)
Chloride: 97 mmol/L — ABNORMAL LOW (ref 98–111)
Creatinine, Ser: 3.35 mg/dL — ABNORMAL HIGH (ref 0.44–1.00)
GFR, Estimated: 15 mL/min — ABNORMAL LOW (ref >60)
Glucose, Bld: 129 mg/dL — ABNORMAL HIGH (ref 70–99)
Potassium: 3.5 mmol/L (ref 3.5–5.1)
Sodium: 137 mmol/L (ref 135–145)

## 2023-10-04 LAB — GLUCOSE, CAPILLARY
Glucose-Capillary: 149 mg/dL — ABNORMAL HIGH (ref 70–99)
Glucose-Capillary: 155 mg/dL — ABNORMAL HIGH (ref 70–99)
Glucose-Capillary: 166 mg/dL — ABNORMAL HIGH (ref 70–99)
Glucose-Capillary: 223 mg/dL — ABNORMAL HIGH (ref 70–99)

## 2023-10-04 LAB — CBC
HCT: 25 % — ABNORMAL LOW (ref 36.0–46.0)
Hemoglobin: 8.2 g/dL — ABNORMAL LOW (ref 12.0–15.0)
MCH: 35 pg — ABNORMAL HIGH (ref 26.0–34.0)
MCHC: 32.8 g/dL (ref 30.0–36.0)
MCV: 106.8 fL — ABNORMAL HIGH (ref 80.0–100.0)
Platelets: 117 10*3/uL — ABNORMAL LOW (ref 150–400)
RBC: 2.34 MIL/uL — ABNORMAL LOW (ref 3.87–5.11)
RDW: 13.8 % (ref 11.5–15.5)
WBC: 9.6 10*3/uL (ref 4.0–10.5)
nRBC: 0 % (ref 0.0–0.2)

## 2023-10-04 LAB — TSH: TSH: 2.682 u[IU]/mL (ref 0.350–4.500)

## 2023-10-04 LAB — MAGNESIUM: Magnesium: 1.9 mg/dL (ref 1.7–2.4)

## 2023-10-04 LAB — HEPATITIS B SURFACE ANTIGEN: Hepatitis B Surface Ag: NONREACTIVE

## 2023-10-04 MED ORDER — DILTIAZEM HCL ER COATED BEADS 120 MG PO CP24
120.0000 mg | ORAL_CAPSULE | Freq: Every day | ORAL | Status: DC
  Administered 2023-10-04: 120 mg via ORAL
  Filled 2023-10-04: qty 1

## 2023-10-04 MED ORDER — POLYETHYLENE GLYCOL 3350 17 G PO PACK
17.0000 g | PACK | Freq: Every day | ORAL | Status: DC
  Administered 2023-10-04: 17 g via ORAL
  Filled 2023-10-04: qty 1

## 2023-10-04 MED ORDER — LEVETIRACETAM 250 MG PO TABS: 250.0000 mg | ORAL_TABLET | ORAL | Status: DC

## 2023-10-04 MED ORDER — LAMOTRIGINE 200 MG PO TABS
300.0000 mg | ORAL_TABLET | Freq: Two times a day (BID) | ORAL | Status: DC
  Administered 2023-10-04: 300 mg via ORAL
  Filled 2023-10-04: qty 2

## 2023-10-04 MED ORDER — CEFUROXIME AXETIL 500 MG PO TABS: 500.0000 mg | ORAL_TABLET | Freq: Every day | ORAL | Status: DC

## 2023-10-04 MED ORDER — LEVETIRACETAM 250 MG PO TABS
250.0000 mg | ORAL_TABLET | ORAL | Status: DC
  Administered 2023-10-04: 250 mg via ORAL
  Filled 2023-10-04: qty 1

## 2023-10-04 MED ORDER — ETHOSUXIMIDE 250 MG PO CAPS
250.0000 mg | ORAL_CAPSULE | Freq: Once | ORAL | Status: AC
  Administered 2023-10-04: 250 mg via ORAL
  Filled 2023-10-04: qty 1

## 2023-10-04 MED ORDER — ETHOSUXIMIDE 250 MG/5ML PO SOLN
375.0000 mg | Freq: Two times a day (BID) | ORAL | Status: DC
  Administered 2023-10-04: 375 mg via ORAL
  Filled 2023-10-04: qty 7.5

## 2023-10-04 MED ORDER — LEVETIRACETAM 250 MG PO TABS
250.0000 mg | ORAL_TABLET | Freq: Once | ORAL | Status: AC
  Administered 2023-10-04: 250 mg via ORAL
  Filled 2023-10-04: qty 1

## 2023-10-04 MED ORDER — CHLORHEXIDINE GLUCONATE CLOTH 2 % EX PADS: 6.0000 | MEDICATED_PAD | Freq: Every day | CUTANEOUS | Status: DC

## 2023-10-04 NOTE — Discharge Planning (Addendum)
 Attempted to wean to RA at rest - but unable. (SPO2 fell to 86%) Then ambulated in hall on 2L and still SPO2 fell to 87% on 2LNC.  So will need 4LNC with Activity. (SPO2 91%)_  Patient reports ONLY has O2 at house, attached to CPAP - does not have O2 at the house to use without CPAP.

## 2023-10-04 NOTE — Discharge Summary (Signed)
 Physician Discharge Summary   Patient: Nina Johnston MRN: [id number] DOB: May 07, 1960  Admit date:     10/03/2023  Discharge date: 10/04/23  Discharge Physician: Luna Salinas   PCP: Yehuda Helms, MD   Recommendations at discharge:  Please obtain CBC and BMP on follow-up Follow-up with cardiology to see if another agent needed for heart rate. Follow-up with primary care provider Follow-up with pulmonology Patient not need oxygen all the time  Discharge Diagnoses: Principal Problem:   SVT (supraventricular tachycardia) (HCC) Active Problems:   PAF (paroxysmal atrial fibrillation) (HCC)   Hypotension   Seizure disorder (HCC)   Hypokalemia   Hypothyroidism   ESRD on dialysis (HCC)   HTN (hypertension)   Chronic diastolic CHF (congestive heart failure) (HCC)   Anemia in ESRD (end-stage renal disease) (HCC)   Thrombocytopenia (HCC)   Obstructive apnea   Bipolar affective disorder (HCC)   Anxiety   Obesity (BMI 30-39.9)   Cellulitis of right knee   Hospital Course: Taken from H&P.  Nina Johnston is a 63 y.o. female with medical history significant of or ESRD on HD, seizure disorder on AEDs, DM, HTN, bipolar mood disorder, dCHF, SDH, PAF/Flutter not on AC, hypothyroidism, gout, anxiety, bipolar, anemia, thrombocytopenia, COPD on 2 L oxygen, left knee cellulitis, who presents with hypotension, dizziness.   Patient is brought to ED via EMS from Dialysis center. She completed a full course of dialysis session with 2.3L of fluid removed today. She developed hypotension with blood pressure dropped from 132/62 to 62/21 --> then to 86/51, in route BP improved some to 99/52, HR increased from 86 bpm to135 bpm and stayed consistent.  EKG showed SVT.  Patient initially had some chest discomfort and dizziness which later resolved.  Of note, patient had fall with right knee injury.  Patient was seen in the ED on 5/25 and had CT scan of right knee which was negative for bony fracture.   Patient was started on Keflex  for possible right knee cellulitis from 5/25 to 5/30, then changed to cefdinir from 5/30 to 6/6, and Levaquin from 5/28 to 5/31.   Labs on arrival with WBC 10.1, potassium 3.2, creatinine 2.14, negative troponin.  Chest x-ray showed cardiomegaly and central vascular congestion.  Patient was started on Cardizem  infusion due to persistent tachycardia.  6/1: Vitals stable, blood pressure 151/60, TSH normal at 2.682, hypokalemia improved, labs stable around baseline.  Patient was given a dose of p.o. Cardizem  at 120 mg and Cardizem  infusion was discontinued.  Her heart rate slowly improved and then ranging in high 50s to mid 60s.  P.o. Cardizem  is not being continued on discharge and she will continue with her home metoprolol .  Patient likely now has become oxygen dependent, she was using 2 L of oxygen while sleeping before.  Currently needing oxygen all the time and requirement increased up to 4 L with ambulation.  Hypoxia might be another contributing factor with electrolyte shifts during dialysis causing this episode.  Home oxygen was ordered at 4 L and she need to follow-up with her pulmonologist for further assistance.  Clinically appears euvolemic, she will continue with her scheduled dialysis.  Next session is tomorrow morning.  Case was discussed with nephrology.  Patient will continue with her current medications and follow-up with her providers for further assistance.   Consultants: Nephrology Procedures performed: None Disposition: Home Diet recommendation:  Discharge Diet Orders (From admission, onward)     Start     Ordered   10/04/23  0000  Diet - low sodium heart healthy        10/04/23 1430           Renal diet DISCHARGE MEDICATION: Allergies as of 10/04/2023       Reactions   Cinnamon Other (See Comments)   Raises blood pressure   Lamotrigine  Other (See Comments)   Generic lamotrigine  triggers seizures, grand mal. TOLERATES BRAND  LAMICTAL  ONLY (GSK)   Procrit [epoetin Alfa-epbx] Other (See Comments)   Seizures, grand mal   Ace Inhibitors Cough   Azithromycin Other (See Comments)   seizures   Iron  Other (See Comments)   Can not take in IV Form - causes seizures    Neomycin -bacitracin  Zn-polymyx Other (See Comments)   Unknown   Other    Sulfa Antibiotics Other (See Comments)   Due to risks for seizures.        Medication List     STOP taking these medications    aspirin EC 81 MG tablet   Auryxia  1 GM 210 MG(Fe) tablet Generic drug: ferric citrate    cephALEXin  500 MG capsule Commonly known as: KEFLEX    levofloxacin 500 MG tablet Commonly known as: LEVAQUIN       TAKE these medications    Align 4 MG Caps Take 4 mg by mouth daily.   cefdinir 300 MG capsule Commonly known as: OMNICEF Take 300 mg by mouth.   clonazePAM  1 MG tablet Commonly known as: KLONOPIN  TAKE 1-2 TABLETS (1-2 MG TOTAL) BY MOUTH DAILY. What changed: how much to take   cloNIDine 0.2 mg/24hr patch Commonly known as: CATAPRES - Dosed in mg/24 hr Place 0.2 mg onto the skin once a week.   Dexcom G6 Sensor Misc Use 1 every 10 days   Dexcom G6 Transmitter Misc USE EVERY 3 MONTHS   DIALYVITE TABLET Tabs Take 1 tablet by mouth daily.   ethosuximide  250 MG/5ML solution Commonly known as: ZARONTIN  TAKE 7. 5 ML BY MOUTH TWICE A DAY TAKE AFTER HD TREATMENT ON DIALYSIS DAY.   famotidine  40 MG tablet Commonly known as: PEPCID  SMARTSIG:1 Tablet(s) By Mouth Every Evening   Febuxostat  80 MG Tabs Take 80 mg by mouth daily.   fexofenadine 180 MG tablet Commonly known as: ALLEGRA Take 180 mg by mouth daily.   fluticasone-salmeterol 250-50 MCG/ACT Aepb Commonly known as: ADVAIR Inhale 1 puff into the lungs in the morning and at bedtime.   furosemide  40 MG tablet Commonly known as: LASIX  Take 60 mg by mouth 2 (two) times daily. TAKE ONE AND ONE-HALF TABLETS TWICE DAILY   Glucagon Emergency 1 MG Kit INJECT 1 MG INTO  THE MUSCLE AS DIRECTED   insulin  glargine 100 UNIT/ML injection Commonly known as: LANTUS  Inject 16 Units into the skin at bedtime. 16 units on Monday,Wednesday,Friday, 12 units on dialysis days   insulin  lispro 100 UNIT/ML injection Commonly known as: HUMALOG Inject 8-15 Units into the skin 3 (three) times daily with meals. Per sliding scale   LaMICtal  200 MG tablet Generic drug: lamoTRIgine  TAKE 1.5 TABLETS (300 MG TOTAL) BY MOUTH 2 (TWO) TIMES DAILY. GLAXO BRAND NAME   levETIRAcetam  250 MG tablet Commonly known as: Keppra  Take Bid on non -dialysis days, 3 a day on hemodialysis days.   levothyroxine  150 MCG tablet Commonly known as: SYNTHROID  Take 150 mcg by mouth daily before breakfast.   metoprolol  succinate 100 MG 24 hr tablet Commonly known as: TOPROL -XL TAKE 1 TABLET BY MOUTH IN THE MORNING AND AT BEDTIME WITH OR IMMEDIATELY  FOLLOWING A MEAL   MIRCERA IJ Inject as directed. Receives w/ dialysis, unsure how often right now   montelukast 10 MG tablet Commonly known as: SINGULAIR SMARTSIG:1 Tablet(s) By Mouth Every Evening   ondansetron  4 MG disintegrating tablet Commonly known as: ZOFRAN -ODT Take 4 mg by mouth every 8 (eight) hours as needed.   pantoprazole 40 MG tablet Commonly known as: PROTONIX Take 40 mg by mouth every morning.   Unifine Pentips 32G X 4 MM Misc Generic drug: Insulin  Pen Needle USE UP TO 5 TIMES DAILY AS DIRECTED   valsartan  80 MG tablet Commonly known as: Diovan  Take 1 tablet (80 mg total) by mouth daily.               Durable Medical Equipment  (From admission, onward)           Start     Ordered   10/04/23 1212  For home use only DME oxygen  Once       Question Answer Comment  Length of Need Lifetime   Mode or (Route) Nasal cannula   Liters per Minute 4   Frequency Continuous (stationary and portable oxygen unit needed)   Oxygen conserving device Yes   Oxygen delivery system Gas      10/04/23 1211             Follow-up Information     Yehuda Helms, MD. Schedule an appointment as soon as possible for a visit in 1 week(s).   Specialty: Internal Medicine Contact information: [address] [place] [address] [phone]                Discharge Exam: Cleavon Curls Weights   10/03/23 2334 10/04/23 0500  Weight: 83.2 kg 83.2 kg   General.  Obese lady, in no acute distress. Pulmonary.  Lungs clear bilaterally, normal respiratory effort. CV.  Regular rate and rhythm, no JVD, rub or murmur. Abdomen.  Soft, nontender, nondistended, BS positive. CNS.  Alert and oriented .  No focal neurologic deficit. Extremities.  No edema, no cyanosis, pulses intact and symmetrical. Psychiatry.  Judgment and insight appears normal.   Condition at discharge: stable  The results of significant diagnostics from this hospitalization (including imaging, microbiology, ancillary and laboratory) are listed below for reference.   Imaging Studies: DG Chest Portable 1 View Result Date: 10/03/2023 CLINICAL DATA:  Chest pain shortness of breath.  Hypotension. EXAM: PORTABLE CHEST 1 VIEW COMPARISON:  05/25/2023. FINDINGS: Stable cardiomegaly. Unchanged loop recorder device overlying the left chest wall. Central pulmonary vascular congestion. No focal consolidation, pleural effusion, or pneumothorax. Remote healed left proximal humeral fracture. No acute osseous abnormality. IMPRESSION: Stable cardiomegaly with central pulmonary vascular congestion. Electronically Signed   By: Mannie Seek M.D.   On: 10/03/2023 18:25   US  Venous Img Lower Right (DVT Study) Result Date: 09/27/2023 CLINICAL DATA:  Right lower extremity swelling EXAM: RIGHT LOWER EXTREMITY VENOUS DOPPLER ULTRASOUND TECHNIQUE: Gray-scale sonography with compression, as well as color and duplex ultrasound, were performed to evaluate the deep venous system(s) from the level of the common femoral vein through the popliteal and  proximal calf veins. COMPARISON:  None Available. FINDINGS: VENOUS Normal compressibility of the common femoral, superficial femoral, and popliteal veins, as well as the visualized calf veins. Visualized portions of profunda femoral vein and great saphenous vein unremarkable. No filling defects to suggest DVT on grayscale or color Doppler imaging. Doppler waveforms show normal direction of venous flow, normal respiratory plasticity and  response to augmentation. Limited views of the contralateral common femoral vein are unremarkable. OTHER None. Limitations: none IMPRESSION: 1. No evidence of deep venous thrombosis within the right lower extremity. Electronically Signed   By: Bobbye Burrow M.D.   On: 09/27/2023 20:03   CT Knee Right Wo Contrast Result Date: 09/27/2023 CLINICAL DATA:  Multiple falls, lateral tibial plateau fracture EXAM: CT OF THE RIGHT KNEE WITHOUT CONTRAST TECHNIQUE: Multidetector CT imaging of the right knee was performed according to the standard protocol. Multiplanar CT image reconstructions were also generated. RADIATION DOSE REDUCTION: This exam was performed according to the departmental dose-optimization program which includes automated exposure control, adjustment of the mA and/or kV according to patient size and/or use of iterative reconstruction technique. COMPARISON:  09/27/2023 FINDINGS: Bones/Joint/Cartilage The bones are diffusely osteopenic. There are no acute displaced fractures. Specifically, no evidence of lateral tibial plateau fracture to correspond to radiographic findings, which likely reflected superimposed shadows. 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 Small suprapatellar joint effusion. Ligaments Suboptimally assessed by CT. Muscles and Tendons Mild diffuse muscular atrophy. No signs of acute intramuscular injury. Soft tissues There is diffuse  subcutaneous edema. Soft tissue imaged Ona seen within the subcutaneous fat within the anterior infrapatellar region. Hematoma extends along the right anterior calf along the tibialis anterior and peroneus longus muscle bellies. Extensive atherosclerosis throughout the popliteal artery and trifurcation vessels. Reconstructed images demonstrate no additional findings. IMPRESSION: 1. No evidence of acute displaced fracture. Findings on corresponding x-ray likely reflect superimposed shadows. 2. Diffuse osteopenia. 3. Soft tissue hematoma within the anterior knee and lower leg, extending from the infrapatellar subcutaneous tissues along the musculature of the anterolateral calf. No evidence of intramuscular injury. 4. Atherosclerosis. Electronically Signed   By: Bobbye Burrow M.D.   On: 09/27/2023 19:12   CT Head Wo Contrast Result Date: 09/27/2023 CLINICAL DATA:  Dizziness and subsequent fall. EXAM: CT HEAD WITHOUT CONTRAST TECHNIQUE: Contiguous axial images were obtained from the base of the skull through the vertex without intravenous contrast. RADIATION DOSE REDUCTION: This exam was performed according to the departmental dose-optimization program which includes automated exposure control, adjustment of the mA and/or kV according to patient size and/or use of iterative reconstruction technique. COMPARISON:  April 29, 2021 FINDINGS: Brain: There is generalized cerebral atrophy with widening of the extra-axial spaces and ventricular dilatation. There are areas of decreased attenuation within the white matter tracts of the supratentorial brain, consistent with microvascular disease changes. Vascular: No hyperdense vessel or unexpected calcification. Skull: Normal. Negative for fracture or focal lesion. Sinuses/Orbits: No acute finding. Other: None. IMPRESSION: 1. No acute intracranial abnormality. 2. Generalized cerebral atrophy and microvascular disease changes of the supratentorial brain. Electronically  Signed   By: Virgle Grime M.D.   On: 09/27/2023 18:30   DG Tibia/Fibula Right Result Date: 09/27/2023 CLINICAL DATA:  Pain after fall. EXAM: RIGHT TIBIA AND FIBULA - 2 VIEW; RIGHT KNEE - COMPLETE 4+ VIEW COMPARISON:  Knee radiograph 07/18/2021 FINDINGS: Knee: Findings suspicious for lateral tibial plateau fracture. There is a joint effusion. The bones are subjectively under mineralized. Small quadriceps and patellar tendon enthesophytes. Generalized soft tissue edema as well as prominent vascular calcifications. Tibia/fibula: No additional fracture of the tibia. No definite fibular fracture. The bones are subjectively under mineralized. Ankle alignment is maintained. Generalized soft tissue edema and prominent vascular calcifications. IMPRESSION: 1. Findings suspicious for lateral tibial plateau fracture. Recommend CT. 2. Knee  joint effusion. 3. Generalized soft tissue edema. Electronically Signed   By: Chadwick Colonel M.D.   On: 09/27/2023 18:06   DG Knee Complete 4 Views Right Result Date: 09/27/2023 CLINICAL DATA:  Pain after fall. EXAM: RIGHT TIBIA AND FIBULA - 2 VIEW; RIGHT KNEE - COMPLETE 4+ VIEW COMPARISON:  Knee radiograph 07/18/2021 FINDINGS: Knee: Findings suspicious for lateral tibial plateau fracture. There is a joint effusion. The bones are subjectively under mineralized. Small quadriceps and patellar tendon enthesophytes. Generalized soft tissue edema as well as prominent vascular calcifications. Tibia/fibula: No additional fracture of the tibia. No definite fibular fracture. The bones are subjectively under mineralized. Ankle alignment is maintained. Generalized soft tissue edema and prominent vascular calcifications. IMPRESSION: 1. Findings suspicious for lateral tibial plateau fracture. Recommend CT. 2. Knee joint effusion. 3. Generalized soft tissue edema. Electronically Signed   By: Chadwick Colonel M.D.   On: 09/27/2023 18:06    Microbiology: Results for orders placed or performed  during the hospital encounter of 02/23/23  KOH prep     Status: None   Collection Time: 02/23/23  9:00 AM   Specimen: Esophagus  Result Value Ref Range Status   Specimen Description ESOPHAGUS  Final   Special Requests NONE  Final   KOH Prep   Final    FEW YEAST Performed at [facility], [address] [place], [place] [address]    Report Status 02/23/2023 FINAL  Final    Labs: CBC: Recent Labs  Lab 09/27/23 1826 10/03/23 1735 10/04/23 0538  WBC 8.4 10.1 9.6  NEUTROABS 5.8  --   --   HGB 8.4* 9.0* 8.2*  HCT 25.3* 27.1* 25.0*  MCV 108.1* 105.4* 106.8*  PLT 96* 121* 117*   Basic Metabolic Panel: Recent Labs  Lab 09/27/23 1826 10/03/23 1735 10/03/23 2005 10/04/23 0538  NA 138 138  --  137  K 4.1 3.2*  --  3.5  CL 96* 96*  --  97*  CO2 29 29  --  29  GLUCOSE 200* 158*  --  129*  BUN 32* 10  --  19  CREATININE 5.00* 2.14*  --  3.35*  CALCIUM 8.5* 8.7*  --  8.7*  MG 2.3  --  1.9  --    Liver Function Tests: Recent Labs  Lab 09/27/23 1826  AST 21  ALT 17  ALKPHOS 89  BILITOT 0.6  PROT 6.2*  ALBUMIN 3.4*   CBG: Recent Labs  Lab 10/04/23 0036 10/04/23 0925 10/04/23 1138  GLUCAP 149* 155* 223*    Discharge time spent: greater than 30 minutes.  This record has been created using Conservation officer, historic buildings. Errors have been sought and corrected,but may not always be located. Such creation errors do not reflect on the standard of care.   Signed: Luna Salinas, MD [facility] 10/04/2023

## 2023-10-04 NOTE — Progress Notes (Signed)
 [facility]  ROUNDING NOTE   Subjective:   Nina Johnston is a 63 year old female with past medical conditions including seizure disorder, diabetes, hypertension, bipolar mood disorder, and end-stage renal disease on hemodialysis.  Patient presents to the emergency department from her dialysis clinic with decreased blood pressure and elevated heart rate.  Patient has been admitted under observation for SVT (supraventricular tachycardia) (HCC) [I47.10] Tachyarrhythmia [R00.0]  Patient is known to our practice and receives outpatient dialysis treatments at [facility] on a MWF schedule, supervised by Dr. Rhesa Celeste.  Patient denies any recent missed treatments.  States she received a full dialysis treatment prior to presentation.  Also reports she had an appointment on Friday, so Saturday's treatment was to make up for the missed Friday's treatment.  Patient states she did experience some low blood pressures during dialysis but at the completion of treatment when she did not want to wait she became really dizzy and lightheaded.  2-3 attempts were made outpatient to weigh patient however each attempt unsuccessful due to hypotensive symptoms.  It was decided at that time to send patient to emergency department for evaluation.  UF 2.3 L removed during that treatment. She is on 2L [place], baseline nocturnal use with CPAP.   Labs on ED arrival consistent with recent dialysis treatment.  Hemoglobin 9.0.  Chest x-ray shows some central pulmonary vascular congestion.  Patient was tachycardic on presentation, heart rate 137.  Patient was given a small fluid bolus in emergency department along with 2 doses of Cardizem  5 mg.  We have been consulted to perform dialysis during this admission.   Objective:  Vital signs in last 24 hours:  Temp:  [97.9 F (36.6 C)-98.8 F (37.1 C)] 98.2 F (36.8 C) (06/01 0758) Pulse Rate:  [64-137] 93 (06/01 0758) Resp:  [14-36] 14 (06/01 0758) BP:  (97-151)/(56-81) 151/60 (06/01 0758) SpO2:  [86 %-100 %] 91 % (06/01 1207) Weight:  [83.2 kg] 83.2 kg (06/01 0500)  Weight change:  Filed Weights   10/03/23 2334 10/04/23 0500  Weight: 83.2 kg 83.2 kg    Intake/Output: I/O last 3 completed shifts: In: 236.5 [I.V.:36.5; IV Piggyback:200] Out: -    Intake/Output this shift:  No intake/output data recorded.  Physical Exam: General: NAD  Head: Normocephalic, atraumatic. Moist oral mucosal membranes  Eyes: Anicteric  Lungs:  Clear to auscultation, [place] O2  Heart: Regular rate and rhythm  Abdomen:  Soft, nontender  Extremities:  1+ peripheral edema.  Neurologic: Awake, alert, conversant  Skin: Warm,dry, RT knee abrasion, RLE ecchymosis  Access: Lt AVF    Basic Metabolic Panel: Recent Labs  Lab 09/27/23 1826 10/03/23 1735 10/03/23 2005 10/04/23 0538  NA 138 138  --  137  K 4.1 3.2*  --  3.5  CL 96* 96*  --  97*  CO2 29 29  --  29  GLUCOSE 200* 158*  --  129*  BUN 32* 10  --  19  CREATININE 5.00* 2.14*  --  3.35*  CALCIUM 8.5* 8.7*  --  8.7*  MG 2.3  --  1.9  --     Liver Function Tests: Recent Labs  Lab 09/27/23 1826  AST 21  ALT 17  ALKPHOS 89  BILITOT 0.6  PROT 6.2*  ALBUMIN 3.4*   No results for input(s): "LIPASE", "AMYLASE" in the last 168 hours. No results for input(s): "AMMONIA" in the last 168 hours.  CBC: Recent Labs  Lab 09/27/23 1826 10/03/23 1735 10/04/23 0538  WBC 8.4  10.1 9.6  NEUTROABS 5.8  --   --   HGB 8.4* 9.0* 8.2*  HCT 25.3* 27.1* 25.0*  MCV 108.1* 105.4* 106.8*  PLT 96* 121* 117*    Cardiac Enzymes: No results for input(s): "CKTOTAL", "CKMB", "CKMBINDEX", "TROPONINI" in the last 168 hours.  BNP: Invalid input(s): "POCBNP"  CBG: Recent Labs  Lab 10/04/23 0036 10/04/23 0925 10/04/23 1138  GLUCAP 149* 155* 223*    Microbiology: Results for orders placed or performed during the hospital encounter of 02/23/23  KOH prep     Status: None   Collection Time: 02/23/23   9:00 AM   Specimen: Esophagus  Result Value Ref Range Status   Specimen Description ESOPHAGUS  Final   Special Requests NONE  Final   KOH Prep   Final    FEW YEAST Performed at [facility], [address], [place], [place] [address]    Report Status 02/23/2023 FINAL  Final    Coagulation Studies: No results for input(s): "LABPROT", "INR" in the last 72 hours.  Urinalysis: No results for input(s): "COLORURINE", "LABSPEC", "PHURINE", "GLUCOSEU", "HGBUR", "BILIRUBINUR", "KETONESUR", "PROTEINUR", "UROBILINOGEN", "NITRITE", "LEUKOCYTESUR" in the last 72 hours.  Invalid input(s): "APPERANCEUR"    Imaging: DG Chest Portable 1 View Result Date: 10/03/2023 CLINICAL DATA:  Chest pain shortness of breath.  Hypotension. EXAM: PORTABLE CHEST 1 VIEW COMPARISON:  05/25/2023. FINDINGS: Stable cardiomegaly. Unchanged loop recorder device overlying the left chest wall. Central pulmonary vascular congestion. No focal consolidation, pleural effusion, or pneumothorax. Remote healed left proximal humeral fracture. No acute osseous abnormality. IMPRESSION: Stable cardiomegaly with central pulmonary vascular congestion. Electronically Signed   By: Mannie Seek M.D.   On: 10/03/2023 18:25     Medications:     acidophilus  1 capsule Oral Daily   [START ON 10/05/2023] cefUROXime   500 mg Oral q1800   clonazePAM   1 mg Oral Daily   diltiazem   120 mg Oral Daily   ethosuximide   375 mg Oral BID   famotidine   40 mg Oral QHS   febuxostat   80 mg Oral Daily   fluticasone furoate-vilanterol  1 puff Inhalation Daily   heparin   5,000 Units Subcutaneous Q8H   insulin  aspart  0-5 Units Subcutaneous QHS   insulin  aspart  0-9 Units Subcutaneous TID WC   insulin  glargine-yfgn  10 Units Subcutaneous QHS   lamoTRIgine   300 mg Oral BID   levETIRAcetam   250 mg Oral 2 times per day on Sunday Tuesday Thursday Saturday   And   [START ON 10/05/2023] levETIRAcetam   250 mg Oral 3 times per day on Monday Wednesday  Friday   levothyroxine   150 mcg Oral Q0600   loratadine  10 mg Oral Daily   metoprolol  succinate  100 mg Oral BID   montelukast  10 mg Oral QHS   multivitamin  1 tablet Oral Daily   pantoprazole  40 mg Oral Q breakfast   polyethylene glycol  17 g Oral Daily   acetaminophen , albuterol , dextromethorphan -guaiFENesin , hydrALAZINE , LORazepam, ondansetron  (ZOFRAN ) IV  Assessment/ Plan:  Nina Johnston is a 63 y.o.  female with past medical conditions including seizure disorder, diabetes, hypertension, bipolar mood disorder, and end-stage renal disease on hemodialysis.  Patient presents to the emergency department from her dialysis clinic with decreased blood pressure and elevated heart rate.  Patient has been admitted under observation for SVT (supraventricular tachycardia) (HCC) [I47.10] Tachyarrhythmia [R00.0]  [facility]/MWF/Lt AVF  Hypotension, likely secondary to volume depletion with dialysis.  UF 2.3 L removed  with outpatient dialysis.  Patient given small normal saline bolus of 200 mL in emergency department.  Currently receiving metoprolol  and diltiazem  only.  2.  End-stage renal disease on hemodialysis.  Last treatment received on Saturday with UF 2.3 L achieved.  If patient remains inpatient, we will perform dialysis on Monday.  Patient may require adjustment in dry weight.  3. Anemia of chronic kidney disease Lab Results  Component Value Date   HGB 8.2 (L) 10/04/2023    Hemoglobin not within optimal range.  Patient does receive Mircera at outpatient clinic.  4. Secondary Hyperparathyroidism: with outpatient labs: PTH 432, phosphorus 9.0, calcium 8.7 on 07/13/23.   Lab Results  Component Value Date   PTH 77 (H) 08/21/2018   CALCIUM 8.7 (L) 10/04/2023   CAION 1.08 (L) 02/23/2023   PHOS 3.1 08/21/2018    Will continue to monitor bone minerals during this admission.    LOS: 0 Kendry Pfarr 6/1/202512:08 PM

## 2023-10-04 NOTE — Hospital Course (Addendum)
 Taken from H&P.  Nina Johnston is a 63 y.o. female with medical history significant of or ESRD on HD, seizure disorder on AEDs, DM, HTN, bipolar mood disorder, dCHF, SDH, PAF/Flutter not on AC, hypothyroidism, gout, anxiety, bipolar, anemia, thrombocytopenia, COPD on 2 L oxygen, left knee cellulitis, who presents with hypotension, dizziness.   Patient is brought to ED via EMS from Dialysis center. She completed a full course of dialysis session with 2.3L of fluid removed today. She developed hypotension with blood pressure dropped from 132/62 to 62/21 --> then to 86/51, in route BP improved some to 99/52, HR increased from 86 bpm to135 bpm and stayed consistent.  EKG showed SVT.  Patient initially had some chest discomfort and dizziness which later resolved.  Of note, patient had fall with right knee injury.  Patient was seen in the ED on 5/25 and had CT scan of right knee which was negative for bony fracture.  Patient was started on Keflex  for possible right knee cellulitis from 5/25 to 5/30, then changed to cefdinir from 5/30 to 6/6, and Levaquin from 5/28 to 5/31.   Labs on arrival with WBC 10.1, potassium 3.2, creatinine 2.14, negative troponin.  Chest x-ray showed cardiomegaly and central vascular congestion.  Patient was started on Cardizem  infusion due to persistent tachycardia.  6/1: Vitals stable, blood pressure 151/60, TSH normal at 2.682, hypokalemia improved, labs stable around baseline.  Patient was given a dose of p.o. Cardizem  at 120 mg and Cardizem  infusion was discontinued.  Her heart rate slowly improved and then ranging in high 50s to mid 60s.  P.o. Cardizem  is not being continued on discharge and she will continue with her home metoprolol .  Patient likely now has become oxygen dependent, she was using 2 L of oxygen while sleeping before.  Currently needing oxygen all the time and requirement increased up to 4 L with ambulation.  Hypoxia might be another contributing factor with  electrolyte shifts during dialysis causing this episode.  Home oxygen was ordered at 4 L and she need to follow-up with her pulmonologist for further assistance.  Clinically appears euvolemic, she will continue with her scheduled dialysis.  Next session is tomorrow morning.  Case was discussed with nephrology.  Patient will continue with her current medications and follow-up with her providers for further assistance.

## 2023-10-04 NOTE — TOC Initial Note (Signed)
 Transition of Care [facility]) - Initial/Assessment Note    Patient Details  Name: Nina Johnston MRN: [id number] Date of Birth: February 05, 1961  [facility]) CM/SW Contact:    Loman Risk, RN Phone Number: 10/04/2023, 12:48 PM  Clinical Narrative:                    Patient to discharge today Patient states that she lives at home with her mother and father.  Patient states that she does not feel that home health is indicated at discharge.  Patient states that she has nocturnal O2 and CPAP through [employer]  Call placed to Meagan with [employer] [facility] [employer] and sat note faxed to [phone] Jacqlyn Matas states that o2 will be delivered today, but she is not able to give me a time frame.  MD and bedside RN updated.  RN to update patient        Patient Goals and CMS Choice              Patient Goals and CMS Choice            Expected Discharge Plan and Services                                              Prior Living Arrangements/Services                       Activities of Daily Living   ADL Screening (condition at time of admission) Independently performs ADLs?: Yes (appropriate for developmental age) Is the patient deaf or have difficulty hearing?: No Does the patient have difficulty seeing, even when wearing glasses/contacts?: No Does the patient have difficulty concentrating, remembering, or making decisions?: No  Permission Sought/Granted                  Emotional Assessment              Admission diagnosis:  SVT (supraventricular tachycardia) (HCC) [I47.10] Tachyarrhythmia [R00.0] Patient Active Problem List   Diagnosis Date Noted   SVT (supraventricular tachycardia) (HCC) 10/03/2023   PAF (paroxysmal atrial fibrillation) (HCC) 10/03/2023   Anxiety 10/03/2023   HTN (hypertension) 10/03/2023   Chronic diastolic CHF (congestive heart failure) (HCC) 10/03/2023   Obesity (BMI 30-39.9) 10/03/2023   Anemia in ESRD  (end-stage renal disease) (HCC) 10/03/2023   Hypotension 10/03/2023   Thrombocytopenia (HCC) 10/03/2023   Hypokalemia 10/03/2023   Cellulitis of right knee 10/03/2023   History of oliguria 06/04/2023   ESRD (end stage renal disease) on dialysis (HCC) 06/04/2023   Hypertensive emergency 05/25/2023   Acute CHF (congestive heart failure) (HCC) 05/25/2023   Supplemental oxygen dependent 02/12/2022   Dependence on CPAP ventilation 02/12/2022   Hypotensive episode 08/13/2021   Atrial flutter (HCC) 09/19/2020   Anemia of chronic kidney failure, stage 5 (HCC) 10/26/2019   Mild aortic valve stenosis 12/27/2018   LVH (left ventricular hypertrophy) due to hypertensive disease, without heart failure 04/14/2018   Partial symptomatic epilepsy with complex partial seizures, not intractable, without status epilepticus (HCC) 04/07/2018   Diabetes mellitus with ESRD (end-stage renal disease) (HCC) 04/07/2018   Ovarian cyst, left 01/15/2018   Spells of decreased attentiveness 12/02/2017   Postdialysis syndrome 12/02/2017   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HCC) 12/02/2017   Chronic superficial gastritis without bleeding 11/18/2017   Hx of adenomatous colonic  polyps 11/18/2017   Anemia due to pre-ESRD treated with erythropoietin 09/23/2017   Hemodialysis access, AV graft (HCC) 09/23/2017   ESRD on dialysis (HCC) 02/06/2017   Heme positive stool 01/07/2017   Delay kidney tx func d/t fluid overload requiring acute dialysis (HCC) 12/03/2016   Menopause 10/22/2016   Status post endometrial ablation 10/22/2016   Swelling of limb 04/18/2016   Kidney dialysis as the cause of abnormal reaction of the patient, or of later complication, without mention of misadventure at the time of the procedure (CODE) 04/18/2016   Large liver 02/05/2016   S/P repair of ventral hernia 02/01/2016   End-stage renal disease on hemodialysis (HCC) 08/24/2015   OAB (overactive bladder) 06/07/2015   Incontinence 06/07/2015   Airway  hyperreactivity 06/06/2015   Binocular vision disorder with diplopia 06/06/2015   Insulin  dependent type 2 diabetes mellitus (HCC) 06/01/2015   Severe diabetic hypoglycemia (HCC) 04/16/2015   Convulsions (HCC) 02/12/2015   Hypoglycemic reaction 02/12/2015   Other symptoms and signs concerning food and fluid intake 02/12/2015   Absence of menstruation 02/12/2015   Absolute anemia 02/12/2015   Post menopausal syndrome 02/12/2015   Calculus of kidney 02/12/2015   Chronic obstructive pulmonary disease (HCC) 02/12/2015   Bone/cartilage disorder 02/12/2015   Urinary system disease 02/12/2015   Encounter for gynecological examination without abnormal finding 02/12/2015   Generalized convulsive epilepsy (HCC) 02/12/2015   Essential (primary) hypertension 02/12/2015   Gout 02/12/2015   Hernia, internal 02/12/2015   Hypoglycemia 02/12/2015   Adaptive colitis 02/12/2015   Arthritis, degenerative 02/12/2015   Postprocedural state 02/12/2015   Dupuytren's contracture of foot 02/12/2015   Cutaneous eruption 02/12/2015   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 02/12/2015   Absence of bladder continence 02/12/2015   Cervical prolapse 02/12/2015   Seizure (HCC) 02/12/2015   Benign essential HTN 12/26/2014   Carotid artery narrowing 12/22/2014   Chest pain 12/22/2014   Combined fat and carbohydrate induced hyperlipemia 12/22/2014   Bilateral carotid artery stenosis 12/22/2014   Acute bacterial sinusitis 04/19/2014   Anemia in chronic illness 03/16/2014   Urinary incontinence with continuous leakage 03/08/2014   Post-concussion syndrome 03/08/2014   Total urinary incontinence 03/08/2014   Brain syndrome, 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   Apnea, sleep 01/17/2014   Patient awaiting renal transplant 06/03/2013   Bipolar affective disorder (HCC) 12/16/2012   GERD (gastroesophageal reflux disease) 12/16/2012   Class 2 severe obesity due to excess calories with serious comorbidity and body  mass index (BMI) of 37.0 to 37.9 in adult [facility]) 12/16/2012   Obstructive apnea 12/16/2012   Addison anemia 12/16/2012   Seizure disorder (HCC) 12/16/2012   Ataxia 11/10/2012   Spells of speech arrest 11/10/2012   Transient alteration of awareness 11/10/2012   Severe obesity (BMI >= 40) (HCC) 11/10/2012   Morbid obesity (HCC) 11/10/2012   Speech disorder 11/10/2012   Encounter for general adult medical examination without abnormal findings 09/08/2012   Multinodular goiter 01/29/2012   Cardiac murmur 10/29/2011   High potassium 10/28/2011   Hypothyroidism 03/12/2011   PCP:  Yehuda Helms, MD Pharmacy:   [facility] [address], [place] - [address] [place] [address] Phone: [phone] Fax: [phone]  [id number] - [place] [facility] - [address] [place] [address] Phone: [phone] Fax: [phone]  [id number] [place], [place] - [address] AT [facility] [address] [place] [address]  Phone: [phone] Fax: [phone]     Social Drivers of Health (SDOH) Social History: SDOH Screenings   Food Insecurity: No Food Insecurity (10/03/2023)  Housing: Low Risk  (10/03/2023)  Transportation Needs: No Transportation Needs (10/03/2023)  Utilities: Not At Risk (10/03/2023)  Financial Resource Strain: Low Risk  (08/04/2023)   Received from [facility]  Physical Activity: Unknown (03/08/2018)  Social Connections: Moderately Integrated (05/25/2023)  Stress: No Stress Concern Present (03/08/2018)  Tobacco Use: Low Risk  (10/04/2023)   SDOH Interventions: Food Insecurity Interventions: Intervention Not Indicated Housing Interventions: Intervention Not Indicated Transportation Interventions: Intervention Not Indicated Utilities Interventions: Intervention Not Indicated   Readmission Risk Interventions     No data to display

## 2023-10-05 ENCOUNTER — Ambulatory Visit (INDEPENDENT_AMBULATORY_CARE_PROVIDER_SITE_OTHER)

## 2023-10-05 ENCOUNTER — Ambulatory Visit: Payer: Self-pay | Admitting: Cardiology

## 2023-10-05 DIAGNOSIS — Z8679 Personal history of other diseases of the circulatory system: Secondary | ICD-10-CM | POA: Diagnosis not present

## 2023-10-05 LAB — CUP PACEART REMOTE DEVICE CHECK
Date Time Interrogation Session: 20250601235625
Implantable Pulse Generator Implant Date: 20240619

## 2023-10-06 ENCOUNTER — Ambulatory Visit (INDEPENDENT_AMBULATORY_CARE_PROVIDER_SITE_OTHER)

## 2023-10-06 DIAGNOSIS — N186 End stage renal disease: Secondary | ICD-10-CM | POA: Diagnosis not present

## 2023-10-13 ENCOUNTER — Encounter (INDEPENDENT_AMBULATORY_CARE_PROVIDER_SITE_OTHER): Payer: Self-pay | Admitting: Vascular Surgery

## 2023-10-13 ENCOUNTER — Ambulatory Visit (INDEPENDENT_AMBULATORY_CARE_PROVIDER_SITE_OTHER): Admitting: Vascular Surgery

## 2023-10-13 VITALS — BP 150/65 | HR 69 | Resp 16 | Wt 188.6 lb

## 2023-10-13 DIAGNOSIS — E1122 Type 2 diabetes mellitus with diabetic chronic kidney disease: Secondary | ICD-10-CM | POA: Diagnosis not present

## 2023-10-13 DIAGNOSIS — Z992 Dependence on renal dialysis: Secondary | ICD-10-CM

## 2023-10-13 DIAGNOSIS — I10 Essential (primary) hypertension: Secondary | ICD-10-CM | POA: Diagnosis not present

## 2023-10-13 DIAGNOSIS — M7989 Other specified soft tissue disorders: Secondary | ICD-10-CM

## 2023-10-13 DIAGNOSIS — N186 End stage renal disease: Secondary | ICD-10-CM | POA: Diagnosis not present

## 2023-10-13 NOTE — Assessment & Plan Note (Signed)
 blood pressure control important in reducing the progression of atherosclerotic disease. On appropriate oral medications.

## 2023-10-13 NOTE — Progress Notes (Signed)
 MRN : [id number]  Nina Johnston is a 63 y.o. (1960-08-16) female who presents with chief complaint of  Chief Complaint  Patient presents with   Follow-up    3 month HDA    .  History of Present Illness: Patient returns today in follow up of her dialysis access.  She also had a fall about a month ago and still has persistent bruising and swelling in the right leg.  This has improved over time.  She developed a significant bruise from her access a couple of weeks ago which has almost completely resolved but not entirely.  Other than that, her access has been working fine without any prolonged bleeding, difficulty with access, or diminished flow rates.  Her duplex last week demonstrated a widely patent left radiocephalic AV fistula without any significant stenosis.  Current Outpatient Medications  Medication Sig Dispense Refill   B Complex-C-Folic Acid  (DIALYVITE TABLET) TABS Take 1 tablet by mouth daily.  12   clonazePAM  (KLONOPIN ) 1 MG tablet TAKE 1-2 TABLETS (1-2 MG TOTAL) BY MOUTH DAILY. (Patient taking differently: Take 1 mg by mouth daily.) 90 tablet 3   cloNIDine (CATAPRES - DOSED IN MG/24 HR) 0.2 mg/24hr patch Place 0.2 mg onto the skin once a week.     Continuous Blood Gluc Sensor (DEXCOM G6 SENSOR) MISC Use 1 every 10 days     Continuous Blood Gluc Transmit (DEXCOM G6 TRANSMITTER) MISC USE EVERY 3 MONTHS     ethosuximide  (ZARONTIN ) 250 MG/5ML solution TAKE 7. 5 ML BY MOUTH TWICE A DAY TAKE AFTER HD TREATMENT ON DIALYSIS DAY. 270 mL 2   famotidine  (PEPCID ) 40 MG tablet SMARTSIG:1 Tablet(s) By Mouth Every Evening     Febuxostat  80 MG TABS Take 80 mg by mouth daily.     fexofenadine (ALLEGRA) 180 MG tablet Take 180 mg by mouth daily.     fluticasone -salmeterol (ADVAIR) 250-50 MCG/ACT AEPB Inhale 1 puff into the lungs in the morning and at bedtime.     furosemide  (LASIX ) 40 MG tablet Take 40 mg by mouth 2 (two) times daily. Sun, Tues, Thurs, Sat     Glucagon, rDNA, (GLUCAGON  EMERGENCY) 1 MG KIT INJECT 1 MG INTO THE MUSCLE AS DIRECTED     insulin  glargine (LANTUS ) 100 UNIT/ML injection Inject 16 Units into the skin daily.     insulin  lispro (HUMALOG) 100 UNIT/ML injection Inject 8-15 Units into the skin 3 (three) times daily with meals. Per sliding scale     LAMICTAL  200 MG tablet TAKE 1.5 TABLETS (300 MG TOTAL) BY MOUTH 2 (TWO) TIMES DAILY. GLAXO BRAND NAME 270 tablet 1   levETIRAcetam  (KEPPRA ) 250 MG tablet Take Bid on non -dialysis days, 3 a day on hemodialysis days. 270 tablet 5   levothyroxine  (SYNTHROID ) 150 MCG tablet Take 150 mcg by mouth daily before breakfast.      Methoxy PEG-Epoetin Beta (MIRCERA IJ) Inject as directed. Receives w/ dialysis, unsure how often right now     metoprolol  succinate (TOPROL -XL) 100 MG 24 hr tablet TAKE 1 TABLET BY MOUTH IN THE MORNING AND AT BEDTIME WITH OR IMMEDIATELY FOLLOWING A MEAL 180 tablet 1   montelukast  (SINGULAIR ) 10 MG tablet SMARTSIG:1 Tablet(s) By Mouth Every Evening     pantoprazole  (PROTONIX ) 40 MG tablet Take 40 mg by mouth every morning.      Probiotic Product (ALIGN) 4 MG CAPS Take 4 mg by mouth daily.      UNIFINE PENTIPS 32G X 4 MM MISC USE  UP TO 5 TIMES DAILY AS DIRECTED  3   valsartan  (DIOVAN ) 80 MG tablet Take 1 tablet (80 mg total) by mouth daily. 180 tablet 3   No current facility-administered medications for this visit.    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  On multiple psychotropic medications, and with DM>    Benign essential HTN 12/26/2014   Bipolar disorder (HCC)    Bronchitis    Carotid artery narrowing 12/22/2014   Cervical prolapse    CHF (congestive heart failure) (HCC)    CKD (chronic kidney disease)    Concussion 01/14/2014   Fall    COPD (chronic  obstructive pulmonary disease) (HCC)    Coronary artery disease    Depression    Diabetes mellitus without complication (HCC)    Dialysis patient (HCC)    Diplopia    Dupuytren's contracture of foot    Essential (primary) hypertension 02/12/2015   Family history of thyroid  problem    GERD (gastroesophageal reflux disease)    Gout    Heart disease    enlarged because of COPD   Heart murmur    Hepatomegaly    HLD (hyperlipidemia)    Hyperactive airway disease    Hyperkalemia    Hypertension    Hypothyroidism    Irritable colon    Morbid obesity (HCC)    Multiple thyroid  nodules    OAB (overactive bladder)    Pernicious anemia    Post menopausal syndrome    Post-concussion syndrome 03/08/2014   Post-traumatic brain syndrome    Prolapsed uterus    PSVT (paroxysmal supraventricular tachycardia) (HCC)    Renal calculus    Renal disease    w/ GFR 29-may be due to diabetes   Seizures (HCC)    last seizure 2012. has had spells since with no knowledge. 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    Transient alteration of awareness 11/10/2012   Urinary incontinence with continuous leakage 03/08/2014    Past Surgical History:  Procedure Laterality Date   A/V FISTULAGRAM Left 07/21/2016   Procedure: A/V Fistulagram;  Surgeon: Celso College, MD;  Location: [facility];  Service: Cardiovascular;  Laterality: Left;   A/V FISTULAGRAM Left 12/24/2016   Procedure: A/V Fistulagram;  Surgeon: Celso College, MD;  Location: [facility];  Service: Cardiovascular;  Laterality: Left;   A/V FISTULAGRAM Left 06/29/2017   Procedure: A/V FISTULAGRAM;  Surgeon: Celso College, MD;  Location: [facility];  Service: Cardiovascular;  Laterality: Left;   A/V FISTULAGRAM Left 03/08/2018   Procedure: A/V FISTULAGRAM;  Surgeon: Celso College, MD;  Location: [facility];  Service: Cardiovascular;   Laterality: Left;   A/V FISTULAGRAM Left 04/11/2019   Procedure: A/V FISTULAGRAM;  Surgeon: Celso College, MD;  Location: [facility];  Service: Cardiovascular;  Laterality: Left;   A/V FISTULAGRAM Left 06/25/2023   Procedure: A/V Fistulagram;  Surgeon: Celso College, MD;  Location: [facility];  Service: Cardiovascular;  Laterality: Left;   A/V SHUNT INTERVENTION N/A 07/21/2016  Procedure: A/V Shunt Intervention;  Surgeon: Celso College, MD;  Location: [facility];  Service: Cardiovascular;  Laterality: N/A;   A/V SHUNT INTERVENTION N/A 12/24/2016   Procedure: A/V SHUNT INTERVENTION;  Surgeon: Celso College, MD;  Location: [facility];  Service: Cardiovascular;  Laterality: N/A;   AVF     BIOPSY  02/23/2023   Procedure: BIOPSY;  Surgeon: Shane Darling, MD;  Location: [facility];  Service: Endoscopy;;   BREAST BIOPSY Left 06/12/2022   LEFT stereo bx, calcs, "X" clip-BENIGN FIBROFATTY BREAST TISSUE WITH FOCAL FATTY DEGENERATION AND ASSOCIATED COARSE DYSTROPHIC CALCIFICATIONS. - NEGATIVE FOR ATYPICAL PROLIFERATIVE BREAST DISEASE.   BREAST BIOPSY Left 06/12/2022   MM LT BREAST BX W LOC DEV 1ST LESION IMAGE BX SPEC STEREO GUIDE 06/12/2022 [facility]   CHOLECYSTECTOMY     COLONOSCOPY     COLONOSCOPY WITH PROPOFOL  N/A 03/18/2017   Procedure: COLONOSCOPY WITH PROPOFOL ;  Surgeon: Cassie Click, MD;  Location: [facility] ENDOSCOPY;  Service: Endoscopy;  Laterality: N/A;   COLONOSCOPY WITH PROPOFOL  N/A 02/23/2023   Procedure: COLONOSCOPY WITH PROPOFOL ;  Surgeon: Shane Darling, MD;  Location: [facility];  Service: Endoscopy;  Laterality: N/A;  DM & DIALYSIS PATIENT   ENDOMETRIAL BIOPSY     ablation, uterine   ESOPHAGEAL BRUSHING  02/23/2023   Procedure: ESOPHAGEAL BRUSHING;  Surgeon: Shane Darling, MD;  Location: [facility];  Service: Endoscopy;;   ESOPHAGOGASTRODUODENOSCOPY (EGD) WITH PROPOFOL  N/A 03/18/2017   Procedure:  ESOPHAGOGASTRODUODENOSCOPY (EGD) WITH PROPOFOL ;  Surgeon: Cassie Click, MD;  Location: [facility] ENDOSCOPY;  Service: Endoscopy;  Laterality: N/A;   ESOPHAGOGASTRODUODENOSCOPY (EGD) WITH PROPOFOL  N/A 02/23/2023   Procedure: ESOPHAGOGASTRODUODENOSCOPY (EGD) WITH PROPOFOL ;  Surgeon: Shane Darling, MD;  Location: [facility];  Service: Endoscopy;  Laterality: N/A;   HERNIA REPAIR     HERNIA REPAIR     PERIPHERAL VASCULAR CATHETERIZATION N/A 08/23/2015   Procedure: Dialysis/Perma Catheter Insertion;  Surgeon: Celso College, MD;  Location: [facility];  Service: Cardiovascular;  Laterality: N/A;   PERIPHERAL VASCULAR CATHETERIZATION N/A 11/09/2015   Procedure: Dialysis/Perma Catheter Removal;  Surgeon: Jackquelyn Mass, MD;  Location: [facility];  Service: Cardiovascular;  Laterality: N/A;   PERIPHERAL VASCULAR CATHETERIZATION Left 04/23/2016   Procedure: A/V Fistulagram;  Surgeon: Celso College, MD;  Location: [facility];  Service: Cardiovascular;  Laterality: Left;   PORT A CATH REVISION       Social History   Tobacco Use   Smoking status: Never   Smokeless tobacco: Never  Vaping Use   Vaping status: Never Used  Substance Use Topics   Alcohol use: No   Drug use: No      Family History  Problem Relation Age of Onset   Diabetes Mother    Lumbar disc disease Mother    Migraines Mother    Hypertension Mother    Cancer - Prostate Father        mild   Diabetes Father    Hypertension Father    Breast cancer Sister 38   Ovarian cancer Neg Hx    Colon cancer Neg Hx    Heart disease Neg Hx    Kidney cancer Neg Hx    Bladder Cancer Neg Hx      Allergies  Allergen Reactions   Cinnamon Other (See Comments)    Raises blood pressure   Lamotrigine  Other (See Comments)    Generic lamotrigine  triggers seizures, grand mal. TOLERATES BRAND LAMICTAL   ONLY (GSK)   Procrit [Epoetin Alfa-Epbx] Other (See Comments)    Seizures, grand mal   Ace Inhibitors  Cough   Azithromycin Other (See Comments)    seizures   Iron  Other (See Comments)    Can not take in IV Form - causes seizures    Neomycin -Bacitracin  Zn-Polymyx Other (See Comments)    Unknown   Other    Sulfa Antibiotics Other (See Comments)    Due to risks for seizures.     REVIEW OF SYSTEMS (Negative unless checked)  Constitutional: [] Weight loss  [] Fever  [] Chills Cardiac: [] Chest pain   [] Chest pressure   [] Palpitations   [] Shortness of breath when laying flat   [] Shortness of breath at rest   [] Shortness of breath with exertion. Vascular:  [] Pain in legs with walking   [] Pain in legs at rest   [] Pain in legs when laying flat   [] Claudication   [] Pain in feet when walking  [] Pain in feet at rest  [] Pain in feet when laying flat   [] History of DVT   [] Phlebitis   [] Swelling in legs   [] Varicose veins   [] Non-healing ulcers Pulmonary:   [] Uses home oxygen   [] Productive cough   [] Hemoptysis   [] Wheeze  [x] COPD   [] Asthma Neurologic:  [] Dizziness  [] Blackouts   [x] Seizures   [] History of stroke   [] History of TIA  [] Aphasia   [] Temporary blindness   [] Dysphagia   [] Weakness or numbness in arms   [] Weakness or numbness in legs Musculoskeletal:  [] Arthritis   [] Joint swelling   [x] Joint pain   [] Low back pain Hematologic:  [x] Easy bruising  [] Easy bleeding   [] Hypercoagulable state   [x] Anemic   Gastrointestinal:  [] Blood in stool   [] Vomiting blood  [] Gastroesophageal reflux/heartburn   [] Abdominal pain Genitourinary:  [x] Chronic kidney disease   [] Difficult urination  [] Frequent urination  [] Burning with urination   [] Hematuria Skin:  [] Rashes   [] Ulcers   [] Wounds Psychological:  [] History of anxiety   []  History of major depression.  Physical Examination  BP (!) 150/65   Pulse 69   Resp 16   Wt 188 lb 9.6 oz (85.5 kg)   BMI 34.50 kg/m  Gen:  WD/WN, NAD Head: [place]/AT, No temporalis wasting. Ear/Nose/Throat: Hearing grossly intact, nares w/o erythema or drainage Eyes:  Conjunctiva clear. Sclera non-icteric Neck: Supple.  Trachea midline Pulmonary:  Good air movement, no use of accessory muscles.  Cardiac: RRR, no JVD Vascular: Excellent thrill in left radiocephalic AV fistula. Vessel Right Left  Radial Palpable Palpable                   Musculoskeletal: M/S 5/5 throughout.  No deformity or atrophy.  Bruising and 2+ edema in the right lower extremity.  Trace left lower extremity edema. Neurologic: Sensation grossly intact in extremities.  Symmetrical.  Speech is fluent.  Psychiatric: Judgment intact, Mood & affect appropriate for pt's clinical situation. Dermatologic: No rashes or ulcers noted.  No cellulitis or open wounds.      Labs Recent Results (from the past 2160 hours)  CUP PACEART REMOTE DEVICE CHECK     Status: None   Collection Time: 07/26/23 11:18 PM  Result Value Ref Range   Date Time Interrogation Session [id number]    Pulse Generator Manufacturer MERM    Pulse Gen Model LNQ22 LINQ II    Pulse Gen Serial Number [id number]    Clinic Name [facility]    Implantable Pulse Generator Type ICM/ILR  Implantable Pulse Generator Implant Date [id number]   CUP PACEART REMOTE DEVICE CHECK     Status: None   Collection Time: 08/30/23 11:21 PM  Result Value Ref Range   Date Time Interrogation Session [id number]    Pulse Generator Manufacturer MERM    Pulse Gen Model LNQ22 LINQ II    Pulse Gen Serial Number [id number]    Clinic Name [facility]    Implantable Pulse Generator Type ICM/ILR    Implantable Pulse Generator Implant Date [id number]   Comprehensive metabolic panel     Status: Abnormal   Collection Time: 09/27/23  6:26 PM  Result Value Ref Range   Sodium 138 135 - 145 mmol/L   Potassium 4.1 3.5 - 5.1 mmol/L   Chloride 96 (L) 98 - 111 mmol/L   CO2 29 22 - 32 mmol/L   Glucose, Bld 200 (H) 70 - 99 mg/dL    Comment: Glucose reference range applies only to samples taken after fasting for at least 8 hours.   BUN 32  (H) 8 - 23 mg/dL   Creatinine, Ser 1.47 (H) 0.44 - 1.00 mg/dL   Calcium 8.5 (L) 8.9 - 10.3 mg/dL   Total Protein 6.2 (L) 6.5 - 8.1 g/dL   Albumin 3.4 (L) 3.5 - 5.0 g/dL   AST 21 15 - 41 U/L   ALT 17 0 - 44 U/L   Alkaline Phosphatase 89 38 - 126 U/L   Total Bilirubin 0.6 0.0 - 1.2 mg/dL   GFR, Estimated 9 (L) >60 mL/min    Comment: (NOTE) Calculated using the CKD-EPI Creatinine Equation (2021)    Anion gap 13 5 - 15    Comment: Performed at [facility], [address] [place], [place] [address]  CBC with Differential     Status: Abnormal   Collection Time: 09/27/23  6:26 PM  Result Value Ref Range   WBC 8.4 4.0 - 10.5 K/uL   RBC 2.34 (L) 3.87 - 5.11 MIL/uL   Hemoglobin 8.4 (L) 12.0 - 15.0 g/dL   HCT 21.3 (L) 08.6 - 57.8 %   MCV 108.1 (H) 80.0 - 100.0 fL   MCH 35.9 (H) 26.0 - 34.0 pg   MCHC 33.2 30.0 - 36.0 g/dL   RDW 46.9 62.9 - 52.8 %   Platelets 96 (L) 150 - 400 K/uL    Comment: Immature Platelet Fraction may be clinically indicated, consider ordering this additional test [id number]    nRBC 0.0 0.0 - 0.2 %   Neutrophils Relative % 68 %   Neutro Abs 5.8 1.7 - 7.7 K/uL   Lymphocytes Relative 23 %   Lymphs Abs 1.9 0.7 - 4.0 K/uL   Monocytes Relative 6 %   Monocytes Absolute 0.5 0.1 - 1.0 K/uL   Eosinophils Relative 1 %   Eosinophils Absolute 0.1 0.0 - 0.5 K/uL   Basophils Relative 1 %   Basophils Absolute 0.0 0.0 - 0.1 K/uL   Immature Granulocytes 1 %   Abs Immature Granulocytes 0.12 (H) 0.00 - 0.07 K/uL    Comment: Performed at [facility], [address] [place], [place] [address]  Troponin I (High Sensitivity)     Status: None   Collection Time: 09/27/23  6:26 PM  Result Value Ref Range   Troponin I (High Sensitivity) 13 <18 ng/L    Comment: (NOTE) Elevated high sensitivity troponin I (hsTnI) values and significant  changes across serial measurements may suggest ACS but many other  chronic and acute conditions are known  to elevate hsTnI  results.  Refer to the "Links" section for chest pain algorithms and additional  guidance. Performed at [facility], [address] [place], [place] [address]   Brain natriuretic peptide     Status: Abnormal   Collection Time: 09/27/23  6:26 PM  Result Value Ref Range   B Natriuretic Peptide 671.1 (H) 0.0 - 100.0 pg/mL    Comment: Performed at [facility], [address] [place], [place] [address]  Magnesium     Status: None   Collection Time: 09/27/23  6:26 PM  Result Value Ref Range   Magnesium 2.3 1.7 - 2.4 mg/dL    Comment: Performed at [facility], [address] [place], [place] [address]  Basic metabolic panel     Status: Abnormal   Collection Time: 10/03/23  5:35 PM  Result Value Ref Range   Sodium 138 135 - 145 mmol/L   Potassium 3.2 (L) 3.5 - 5.1 mmol/L   Chloride 96 (L) 98 - 111 mmol/L   CO2 29 22 - 32 mmol/L   Glucose, Bld 158 (H) 70 - 99 mg/dL    Comment: Glucose reference range applies only to samples taken after fasting for at least 8 hours.   BUN 10 8 - 23 mg/dL   Creatinine, Ser 9.14 (H) 0.44 - 1.00 mg/dL   Calcium 8.7 (L) 8.9 - 10.3 mg/dL   GFR, Estimated 25 (L) >60 mL/min    Comment: (NOTE) Calculated using the CKD-EPI Creatinine Equation (2021)    Anion gap 13 5 - 15    Comment: Performed at [facility], [address] [place], [place] [address]  Troponin I (High Sensitivity)     Status: None   Collection Time: 10/03/23  5:35 PM  Result Value Ref Range   Troponin I (High Sensitivity) 9 <18 ng/L    Comment: (NOTE) Elevated high sensitivity troponin I (hsTnI) values and significant  changes across serial measurements may suggest ACS but many other  chronic and acute conditions are known to elevate hsTnI results.  Refer to the "Links" section for chest pain algorithms and additional  guidance. Performed at [facility], [address] [place], [place] [address]   CBC     Status: Abnormal    Collection Time: 10/03/23  5:35 PM  Result Value Ref Range   WBC 10.1 4.0 - 10.5 K/uL   RBC 2.57 (L) 3.87 - 5.11 MIL/uL   Hemoglobin 9.0 (L) 12.0 - 15.0 g/dL   HCT 86.5 (L) 78.4 - 69.6 %   MCV 105.4 (H) 80.0 - 100.0 fL   MCH 35.0 (H) 26.0 - 34.0 pg   MCHC 33.2 30.0 - 36.0 g/dL   RDW 29.5 28.4 - 13.2 %   Platelets 121 (L) 150 - 400 K/uL   nRBC 0.0 0.0 - 0.2 %    Comment: Performed at [facility], [address] [place], [place] [address]  Hepatitis B surface antigen     Status: None   Collection Time: 10/03/23  5:35 PM  Result Value Ref Range   Hepatitis B Surface Ag NON REACTIVE NON REACTIVE    Comment: Performed at [facility], [address], [place], [place] [address]  Troponin I (High Sensitivity)     Status: None   Collection Time: 10/03/23  8:05 PM  Result Value Ref Range   Troponin I (High Sensitivity) 12 <18 ng/L    Comment: (NOTE) Elevated high sensitivity troponin I (hsTnI)  values and significant  changes across serial measurements may suggest ACS but many other  chronic and acute conditions are known to elevate hsTnI results.  Refer to the "Links" section for chest pain algorithms and additional  guidance. Performed at [facility], [address] [place], [place] [address]   TSH     Status: None   Collection Time: 10/03/23  8:05 PM  Result Value Ref Range   TSH 2.682 0.350 - 4.500 uIU/mL    Comment: Performed by a 3rd Generation assay with a functional sensitivity of <=0.01 uIU/mL. Performed at [facility], [address] [place], [place] [address]   Magnesium     Status: None   Collection Time: 10/03/23  8:05 PM  Result Value Ref Range   Magnesium 1.9 1.7 - 2.4 mg/dL    Comment: Performed at [facility], [address] [place], [place] [address]  Glucose, capillary     Status: Abnormal   Collection Time: 10/04/23 12:36 AM  Result Value Ref Range   Glucose-Capillary 149 (H) 70 - 99 mg/dL    Comment:  Glucose reference range applies only to samples taken after fasting for at least 8 hours.  Basic metabolic panel     Status: Abnormal   Collection Time: 10/04/23  5:38 AM  Result Value Ref Range   Sodium 137 135 - 145 mmol/L   Potassium 3.5 3.5 - 5.1 mmol/L   Chloride 97 (L) 98 - 111 mmol/L   CO2 29 22 - 32 mmol/L   Glucose, Bld 129 (H) 70 - 99 mg/dL    Comment: Glucose reference range applies only to samples taken after fasting for at least 8 hours.   BUN 19 8 - 23 mg/dL   Creatinine, Ser 2.13 (H) 0.44 - 1.00 mg/dL   Calcium 8.7 (L) 8.9 - 10.3 mg/dL   GFR, Estimated 15 (L) >60 mL/min    Comment: (NOTE) Calculated using the CKD-EPI Creatinine Equation (2021)    Anion gap 11 5 - 15    Comment: Performed at [facility], [address] [place], [place] [address]  CBC     Status: Abnormal   Collection Time: 10/04/23  5:38 AM  Result Value Ref Range   WBC 9.6 4.0 - 10.5 K/uL   RBC 2.34 (L) 3.87 - 5.11 MIL/uL   Hemoglobin 8.2 (L) 12.0 - 15.0 g/dL   HCT 84.6 (L) 96.2 - 95.2 %   MCV 106.8 (H) 80.0 - 100.0 fL   MCH 35.0 (H) 26.0 - 34.0 pg   MCHC 32.8 30.0 - 36.0 g/dL   RDW 84.1 32.4 - 40.1 %   Platelets 117 (L) 150 - 400 K/uL   nRBC 0.0 0.0 - 0.2 %    Comment: Performed at [facility], [address] [place], [place] [address]  Glucose, capillary     Status: Abnormal   Collection Time: 10/04/23  9:25 AM  Result Value Ref Range   Glucose-Capillary 155 (H) 70 - 99 mg/dL    Comment: Glucose reference range applies only to samples taken after fasting for at least 8 hours.  Glucose, capillary     Status: Abnormal   Collection Time: 10/04/23 11:38 AM  Result Value Ref Range   Glucose-Capillary 223 (H) 70 - 99 mg/dL    Comment: Glucose reference range applies only to samples taken after fasting for at least 8 hours.  Glucose, capillary     Status: Abnormal   Collection Time: 10/04/23  4:00 PM  Result Value Ref Range   Glucose-Capillary 166 (H) 70 - 99 mg/dL     Comment: Glucose reference range applies only to samples taken after fasting for at least 8 hours.  CUP PACEART REMOTE DEVICE CHECK     Status: None   Collection Time: 10/04/23 11:56 PM  Result Value Ref Range   Date Time Interrogation Session [id number]    Pulse Generator Manufacturer MERM    Pulse Gen Model LNQ22 LINQ II    Pulse Gen Serial Number [id number]    Clinic Name [facility]    Implantable Pulse Generator Type ICM/ILR    Implantable Pulse Generator Implant Date [id number]     Radiology VAS US  DUPLEX DIALYSIS ACCESS (AVF, AVG) Result Date: 10/08/2023 DIALYSIS ACCESS Patient Name:  KARLISSA ARON Boer  Date of Exam:   10/06/2023 Medical Rec #: [id number]         Accession #:    [id number] Date of Birth: [birth year]         Patient Gender: F Patient Age:   2 years Exam Location:  [facility] Procedure:      VAS US  DUPLEX DIALYSIS ACCESS (AVF, AVG) Referring Phys: Mikki Alexander --------------------------------------------------------------------------------  Reason for Exam: Routine follow up. Access Site: Left Upper Extremity. Access Type: Radial-cephalic AVF. Performing Technologist: Oneta Bilberry RVT  Examination Guidelines: A complete evaluation includes B-mode imaging, spectral Doppler, color Doppler, and power Doppler as needed of all accessible portions of each vessel. Unilateral testing is considered an integral part of a complete examination. Limited examinations for reoccurring indications may be performed as noted.  Findings: +--------------------+----------+-----------------+--------+ AVF                 PSV (cm/s)Flow Vol (mL/min)Comments +--------------------+----------+-----------------+--------+ Native artery inflow   202           800                +--------------------+----------+-----------------+--------+ AVF Anastomosis        677                              +--------------------+----------+-----------------+--------+   +------------+----------+-------------+----------+--------+ OUTFLOW VEINPSV (cm/s)Diameter (cm)Depth (cm)Describe +------------+----------+-------------+----------+--------+ Dist UA         75        0.59                        +------------+----------+-------------+----------+--------+ AC Fossa       143        0.95                        +------------+----------+-------------+----------+--------+ Prox Forearm    48        1.59                        +------------+----------+-------------+----------+--------+ Mid Forearm    432        1.74                        +------------+----------+-------------+----------+--------+ Dist Forearm   339        1.43                        +------------+----------+-------------+----------+--------+   Summary: Patent arteriovenous fistula. *See table(s) above for measurements and observations.  Diagnosing physician: Mikki Alexander MD Electronically  signed by Mikki Alexander MD on 10/08/2023 at 8:35:26 AM.    --------------------------------------------------------------------------------   Final    CUP PACEART REMOTE DEVICE CHECK Result Date: 10/05/2023 ILR summary report received. Battery status OK. Normal device function. No new symptom, tachy, brady, or pause episodes. No new AF episodes. Monthly summary reports and ROV/PRN. MC, CVRS  DG Chest Portable 1 View Result Date: 10/03/2023 CLINICAL DATA:  Chest pain shortness of breath.  Hypotension. EXAM: PORTABLE CHEST 1 VIEW COMPARISON:  05/25/2023. FINDINGS: Stable cardiomegaly. Unchanged loop recorder device overlying the left chest wall. Central pulmonary vascular congestion. No focal consolidation, pleural effusion, or pneumothorax. Remote healed left proximal humeral fracture. No acute osseous abnormality. IMPRESSION: Stable cardiomegaly with central pulmonary vascular congestion. Electronically Signed   By: Mannie Seek M.D.   On: 10/03/2023 18:25   US  Venous Img Lower Right (DVT  Study) Result Date: 09/27/2023 CLINICAL DATA:  Right lower extremity swelling EXAM: RIGHT LOWER EXTREMITY VENOUS DOPPLER ULTRASOUND TECHNIQUE: Gray-scale sonography with compression, as well as color and duplex ultrasound, were performed to evaluate the deep venous system(s) from the level of the common femoral vein through the popliteal and proximal calf veins. COMPARISON:  None Available. FINDINGS: VENOUS Normal compressibility of the common femoral, superficial femoral, and popliteal veins, as well as the visualized calf veins. Visualized portions of profunda femoral vein and great saphenous vein unremarkable. No filling defects to suggest DVT on grayscale or color Doppler imaging. Doppler waveforms show normal direction of venous flow, normal respiratory plasticity and response to augmentation. Limited views of the contralateral common femoral vein are unremarkable. OTHER None. Limitations: none IMPRESSION: 1. No evidence of deep venous thrombosis within the right lower extremity. Electronically Signed   By: Bobbye Burrow M.D.   On: 09/27/2023 20:03   CT Knee Right Wo Contrast Result Date: 09/27/2023 CLINICAL DATA:  Multiple falls, lateral tibial plateau fracture EXAM: CT OF THE RIGHT KNEE WITHOUT CONTRAST TECHNIQUE: Multidetector CT imaging of the right knee was performed according to the standard protocol. Multiplanar CT image reconstructions were also generated. RADIATION DOSE REDUCTION: This exam was performed according to the departmental dose-optimization program which includes automated exposure control, adjustment of the mA and/or kV according to patient size and/or use of iterative reconstruction technique. COMPARISON:  09/27/2023 FINDINGS: Bones/Joint/Cartilage The bones are diffusely osteopenic. There are no acute displaced fractures. Specifically, no evidence of lateral tibial plateau fracture to correspond to radiographic findings, which likely reflected superimposed shadows. 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 Small suprapatellar joint effusion. Ligaments Suboptimally assessed by CT. Muscles and Tendons Mild diffuse muscular atrophy. No signs of acute intramuscular injury. Soft tissues There is diffuse subcutaneous edema. Soft tissue imaged Ona seen within the subcutaneous fat within the anterior infrapatellar region. Hematoma extends along the right anterior calf along the tibialis anterior and peroneus longus muscle bellies. Extensive atherosclerosis throughout the popliteal artery and trifurcation vessels. Reconstructed images demonstrate no additional findings. IMPRESSION: 1. No evidence of acute displaced fracture. Findings on corresponding x-ray likely reflect superimposed shadows. 2. Diffuse osteopenia. 3. Soft tissue hematoma within the anterior knee and lower leg, extending from the infrapatellar subcutaneous tissues along the musculature of the anterolateral calf. No evidence of intramuscular injury. 4. Atherosclerosis. Electronically Signed   By: Bobbye Burrow M.D.   On: 09/27/2023 19:12   CT Head Wo Contrast Result Date: 09/27/2023 CLINICAL DATA:  Dizziness and subsequent fall. EXAM: CT HEAD  WITHOUT CONTRAST TECHNIQUE: Contiguous axial images were obtained from the base of the skull through the vertex without intravenous contrast. RADIATION DOSE REDUCTION: This exam was performed according to the departmental dose-optimization program which includes automated exposure control, adjustment of the mA and/or kV according to patient size and/or use of iterative reconstruction technique. COMPARISON:  April 29, 2021 FINDINGS: Brain: There is generalized cerebral atrophy with widening of the extra-axial spaces and ventricular dilatation. There are areas of decreased attenuation within the white matter tracts of the supratentorial brain, consistent with  microvascular disease changes. Vascular: No hyperdense vessel or unexpected calcification. Skull: Normal. Negative for fracture or focal lesion. Sinuses/Orbits: No acute finding. Other: None. IMPRESSION: 1. No acute intracranial abnormality. 2. Generalized cerebral atrophy and microvascular disease changes of the supratentorial brain. Electronically Signed   By: Virgle Grime M.D.   On: 09/27/2023 18:30   DG Tibia/Fibula Right Result Date: 09/27/2023 CLINICAL DATA:  Pain after fall. EXAM: RIGHT TIBIA AND FIBULA - 2 VIEW; RIGHT KNEE - COMPLETE 4+ VIEW COMPARISON:  Knee radiograph 07/18/2021 FINDINGS: Knee: Findings suspicious for lateral tibial plateau fracture. There is a joint effusion. The bones are subjectively under mineralized. Small quadriceps and patellar tendon enthesophytes. Generalized soft tissue edema as well as prominent vascular calcifications. Tibia/fibula: No additional fracture of the tibia. No definite fibular fracture. The bones are subjectively under mineralized. Ankle alignment is maintained. Generalized soft tissue edema and prominent vascular calcifications. IMPRESSION: 1. Findings suspicious for lateral tibial plateau fracture. Recommend CT. 2. Knee joint effusion. 3. Generalized soft tissue edema. Electronically Signed   By: Chadwick Colonel M.D.   On: 09/27/2023 18:06   DG Knee Complete 4 Views Right Result Date: 09/27/2023 CLINICAL DATA:  Pain after fall. EXAM: RIGHT TIBIA AND FIBULA - 2 VIEW; RIGHT KNEE - COMPLETE 4+ VIEW COMPARISON:  Knee radiograph 07/18/2021 FINDINGS: Knee: Findings suspicious for lateral tibial plateau fracture. There is a joint effusion. The bones are subjectively under mineralized. Small quadriceps and patellar tendon enthesophytes. Generalized soft tissue edema as well as prominent vascular calcifications. Tibia/fibula: No additional fracture of the tibia. No definite fibular fracture. The bones are subjectively under mineralized. Ankle alignment is  maintained. Generalized soft tissue edema and prominent vascular calcifications. IMPRESSION: 1. Findings suspicious for lateral tibial plateau fracture. Recommend CT. 2. Knee joint effusion. 3. Generalized soft tissue edema. Electronically Signed   By: Chadwick Colonel M.D.   On: 09/27/2023 18:06    Assessment/Plan  ESRD (end stage renal disease) on dialysis [facility]) Her duplex last week demonstrated a widely patent left radiocephalic AV fistula without any significant stenosis.  It sounds like she had an infiltration a few weeks ago but that has healed now.  I would not make any major changes at this time.  Continue to rotate the access sites.  Recheck in 6 months.  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 Also, involved in wound healing. On appropriate medications.   HTN (hypertension) blood pressure control important in reducing the progression of atherosclerotic disease. On appropriate oral medications.   Swelling of limb Right leg is swollen and bruised from her fall.  Recommended elevation and a compression sock as well as increasing activity.    Mikki Alexander, MD  10/13/2023 4:04 PM    This note was created with Dragon medical transcription system.  Any errors from dictation are purely unintentional

## 2023-10-13 NOTE — Assessment & Plan Note (Signed)
 Her duplex last week demonstrated a widely patent left radiocephalic AV fistula without any significant stenosis.  It sounds like she had an infiltration a few weeks ago but that has healed now.  I would not make any major changes at this time.  Continue to rotate the access sites.  Recheck in 6 months.

## 2023-10-13 NOTE — Assessment & Plan Note (Signed)
 blood glucose control important in reducing the progression of atherosclerotic disease. Also, involved in wound healing. On appropriate medications.

## 2023-10-13 NOTE — Assessment & Plan Note (Signed)
 Right leg is swollen and bruised from her fall.  Recommended elevation and a compression sock as well as increasing activity.

## 2023-10-16 ENCOUNTER — Emergency Department
Admission: EM | Admit: 2023-10-16 | Discharge: 2023-10-16 | Disposition: A | Discharge status: Home / Self Care | Attending: Emergency Medicine | Admitting: Emergency Medicine

## 2023-10-16 ENCOUNTER — Emergency Department

## 2023-10-16 ENCOUNTER — Other Ambulatory Visit: Payer: Self-pay

## 2023-10-16 DIAGNOSIS — R079 Chest pain, unspecified: Secondary | ICD-10-CM

## 2023-10-16 DIAGNOSIS — I509 Heart failure, unspecified: Secondary | ICD-10-CM | POA: Diagnosis not present

## 2023-10-16 DIAGNOSIS — R531 Weakness: Secondary | ICD-10-CM | POA: Diagnosis not present

## 2023-10-16 DIAGNOSIS — R0789 Other chest pain: Secondary | ICD-10-CM | POA: Insufficient documentation

## 2023-10-16 DIAGNOSIS — I132 Hypertensive heart and chronic kidney disease with heart failure and with stage 5 chronic kidney disease, or end stage renal disease: Secondary | ICD-10-CM | POA: Insufficient documentation

## 2023-10-16 DIAGNOSIS — Z992 Dependence on renal dialysis: Secondary | ICD-10-CM | POA: Insufficient documentation

## 2023-10-16 DIAGNOSIS — J449 Chronic obstructive pulmonary disease, unspecified: Secondary | ICD-10-CM | POA: Diagnosis not present

## 2023-10-16 DIAGNOSIS — N186 End stage renal disease: Secondary | ICD-10-CM | POA: Diagnosis not present

## 2023-10-16 LAB — CBC
HCT: 27.9 % — ABNORMAL LOW (ref 36.0–46.0)
Hemoglobin: 9.5 g/dL — ABNORMAL LOW (ref 12.0–15.0)
MCH: 36.4 pg — ABNORMAL HIGH (ref 26.0–34.0)
MCHC: 34.1 g/dL (ref 30.0–36.0)
MCV: 106.9 fL — ABNORMAL HIGH (ref 80.0–100.0)
Platelets: 87 10*3/uL — ABNORMAL LOW (ref 150–400)
RBC: 2.61 MIL/uL — ABNORMAL LOW (ref 3.87–5.11)
RDW: 14.3 % (ref 11.5–15.5)
WBC: 10.1 10*3/uL (ref 4.0–10.5)
nRBC: 0 % (ref 0.0–0.2)

## 2023-10-16 LAB — COMPREHENSIVE METABOLIC PANEL WITH GFR
ALT: 16 U/L (ref 0–44)
AST: 21 U/L (ref 15–41)
Albumin: 3.8 g/dL (ref 3.5–5.0)
Alkaline Phosphatase: 100 U/L (ref 38–126)
Anion gap: 13 (ref 5–15)
BUN: 13 mg/dL (ref 8–23)
CO2: 30 mmol/L (ref 22–32)
Calcium: 8.2 mg/dL — ABNORMAL LOW (ref 8.9–10.3)
Chloride: 89 mmol/L — ABNORMAL LOW (ref 98–111)
Creatinine, Ser: 1.74 mg/dL — ABNORMAL HIGH (ref 0.44–1.00)
GFR, Estimated: 33 mL/min — ABNORMAL LOW (ref >60)
Glucose, Bld: 258 mg/dL — ABNORMAL HIGH (ref 70–99)
Potassium: 3.1 mmol/L — ABNORMAL LOW (ref 3.5–5.1)
Sodium: 132 mmol/L — ABNORMAL LOW (ref 135–145)
Total Bilirubin: 0.7 mg/dL (ref 0.0–1.2)
Total Protein: 6.9 g/dL (ref 6.5–8.1)

## 2023-10-16 LAB — TROPONIN I (HIGH SENSITIVITY)
Troponin I (High Sensitivity): 8 ng/L (ref <18)
Troponin I (High Sensitivity): 8 ng/L (ref <18)

## 2023-10-16 NOTE — ED Provider Notes (Addendum)
 [facility] Provider Note    Event Date/Time   First MD Initiated Contact with Patient 10/16/23 1557     (approximate)  History   Chief Complaint: Chest Pain  HPI  Nina Johnston is a 63 y.o. female with a past medical Struve anxiety, ESRD on HD Monday/Wednesday/Friday, CHF, COPD, hypertension, presents to the emergency department for chest tightness.  According to the patient she was at dialysis today during her dialysis session began feeling chest tightness along with weakness throughout her body.  Dialysis was discontinued and the patient was brought to the emergency department for evaluation.  Patient had already had 3.2 L removed per report.  Patient does wear oxygen 2 L as needed currently satting 100% on room air.  Denies any shortness of breath.  States very slight chest tightness still.  No history of cardiovascular disease per patient.  Physical Exam   Triage Vital Signs: ED Triage Vitals [10/16/23 1557]  Encounter Vitals Group     BP      Girls Systolic BP Percentile      Girls Diastolic BP Percentile      Boys Systolic BP Percentile      Boys Diastolic BP Percentile      Pulse Rate 68     Resp 16     Temp 97.8 F (36.6 C)     Temp Source Oral     SpO2 100 %     Weight      Height      Head Circumference      Peak Flow      Pain Score      Pain Loc      Pain Education      Exclude from Growth Chart     Most recent vital signs: Vitals:   10/16/23 1557  Pulse: 68  Resp: 16  Temp: 97.8 F (36.6 C)  SpO2: 100%    General: Awake, no distress.  CV:  Good peripheral perfusion.  Regular rate and rhythm  Resp:  Normal effort.  Equal breath sounds bilaterally.  Abd:  No distention.  Soft, nontender.  No rebound or guarding. Other:  Minimal lower extremity edema bilaterally.   ED Results / Procedures / Treatments   EKG  EKG viewed and interpreted by myself shows a normal sinus rhythm at 72 bpm with a narrow QRS, normal axis,  normal intervals besides slight QTc prolongation, nonspecific ST changes.    RADIOLOGY  I have reviewed and interpreted chest x-ray images.  No obvious consolidation seen on my evaluation. Radiologist read the x-ray as negative   MEDICATIONS ORDERED IN ED: Medications - No data to display   IMPRESSION / MDM / ASSESSMENT AND PLAN / ED COURSE  I reviewed the triage vital signs and the nursing notes.  Patient's presentation is most consistent with acute presentation with potential threat to life or bodily function.  Patient presents to the emergency department from dialysis for chest tightness that started during her dialysis session.  Overall the patient appears well, no distress.  States very slight chest tightness currently.  No shortness of breath.  Satting 100% on room air.  We will check labs including cardiac enzymes, chest x-ray  Patient's chest x-ray is negative.  Patient's lab work is reassuring with a reassuring CBC, reassuring chemistry with a normal potassium.  Patient's troponins are negative x 2.  EKG shows no concerning findings.  Patient states she feels back to normal denies any symptoms states  she is ready to go home.  Given the patient's reassuring workup I believe the patient is safe for discharge home with PCP follow-up.  Provided my typical chest pain return precautions.  FINAL CLINICAL IMPRESSION(S) / ED DIAGNOSES   Chest pain    Note:  This document was prepared using Dragon voice recognition software and may include unintentional dictation errors.   Ruth Cove, MD 10/16/23 2035    Ruth Cove, MD 10/16/23 2035

## 2023-10-16 NOTE — ED Triage Notes (Addendum)
 BIBEMS, coming from dialysis. Only 3.2L off Pt with sudden chest, radiates to ABD, arms and legs, sharp in nature. 138/64, GCS 15. 2L [place] baseline as needed @night . PMH: M-W-F dialysis, DM, COPD. 12lead NSR.

## 2023-10-18 ENCOUNTER — Other Ambulatory Visit: Payer: Self-pay | Admitting: Neurology

## 2023-10-18 DIAGNOSIS — R404 Transient alteration of awareness: Secondary | ICD-10-CM

## 2023-10-18 DIAGNOSIS — R270 Ataxia, unspecified: Secondary | ICD-10-CM

## 2023-10-18 DIAGNOSIS — R4789 Other speech disturbances: Secondary | ICD-10-CM

## 2023-10-19 ENCOUNTER — Encounter: Payer: Self-pay | Admitting: Neurology

## 2023-10-19 ENCOUNTER — Ambulatory Visit (INDEPENDENT_AMBULATORY_CARE_PROVIDER_SITE_OTHER): Admitting: Neurology

## 2023-10-19 DIAGNOSIS — R404 Transient alteration of awareness: Secondary | ICD-10-CM

## 2023-10-19 DIAGNOSIS — R4789 Other speech disturbances: Secondary | ICD-10-CM

## 2023-10-19 DIAGNOSIS — R270 Ataxia, unspecified: Secondary | ICD-10-CM | POA: Diagnosis not present

## 2023-10-19 DIAGNOSIS — G40209 Localization-related (focal) (partial) symptomatic epilepsy and epileptic syndromes with complex partial seizures, not intractable, without status epilepticus: Secondary | ICD-10-CM | POA: Diagnosis not present

## 2023-10-19 MED ORDER — ETHOSUXIMIDE 250 MG/5ML PO SOLN
ORAL | 2 refills | Status: DC
Start: 2023-10-19 — End: 2024-01-06

## 2023-10-19 MED ORDER — LAMICTAL 200 MG PO TABS: 300.0000 mg | ORAL_TABLET | Freq: Two times a day (BID) | ORAL | 1 refills | Status: DC

## 2023-10-19 MED ORDER — LEVETIRACETAM 250 MG PO TABS: ORAL_TABLET | ORAL | 5 refills | Status: DC

## 2023-10-19 MED ORDER — CLONAZEPAM 1 MG PO TABS: 1.0000 mg | ORAL_TABLET | Freq: Every day | ORAL | 5 refills | Status: DC

## 2023-10-19 NOTE — Progress Notes (Signed)
 Provider:  [name], MD  Primary Care Physician:  Yehuda Helms, MD [address] [facility] [place] [address]     Referring Provider: Yehuda Helms, Md [address] [facility] [place],  [place] [address]          Chief Complaint according to patient   Patient presents with:                HISTORY OF PRESENT ILLNESS:  Nina Johnston is a 63 y.o. female patient who is here for revisit 10/19/2023 for  a follow up to a recent ED visit , she fell in her home May 23 rd and developed a leg injury, cellulitis .  Heart rate was 135 bpm and her BP was low - 12 hours in ED 5-25- through 5-26 for observation and was given diltiazem  drip, was told Keppra  may have caused this(?) .  Over a week ago in the midst of the night, Nina Johnston was sleep talking  sitting on the side of her bed. Her mother tried to speak to her and Nina Johnston rubbed her head again and again and  then tried to push her mom away . The next morning she was completely normal.    Chief concern according to patient :   should we adjust the keppra  ? ; my answer is no.    Instead we will wean off Zarontin -  we will go from 7.5 ml bid to 5  Ml bid for one month, then 2.5 ml the next month bid.  She has 3 liters of fluid removed the last HD treatment.  She has to adhere better to fluid restrictions until last week.    AV fistula works well.     Review of Systems: Out of a complete 14 system review, the patient complains of only the following symptoms, and all other reviewed systems are negative.:        Social History   Socioeconomic History   Marital status: Single    Spouse name: Not on file   Number of children: 0   Years of education:  college   Highest education level: Not on file  Occupational History   Occupation: not employed  Tobacco Use   Smoking status: Never   Smokeless tobacco: Never  Vaping Use   Vaping status: Never Used  Substance and  Sexual Activity   Alcohol use: No   Drug use: No   Sexual activity: Not Currently  Other Topics Concern   Not on file  Social History Narrative   Patient is single and lives with her parents.   Patient is disabled.   Patient has a college education.   Patient is right- handed.   Patient drinks tea occasionally. 2 glasses of tea when they eat out.   Social Drivers of Corporate investment banker Strain: Low Risk  (08/04/2023)   Received from [facility]   Overall Financial Resource Strain (CARDIA)    Difficulty of Paying Living Expenses: Not hard at all  Food Insecurity: No Food Insecurity (10/03/2023)   Hunger Vital Sign    Worried About Running Out of Food in the Last Year: Never true    Ran Out of Food in the Last Year: Never true  Transportation Needs: No Transportation Needs (10/03/2023)   PRAPARE - Administrator, Civil Service (Medical): No    Lack of Transportation (Non-Medical): No  Physical Activity: Unknown (03/08/2018)   Exercise Vital Sign    Days of Exercise per Week: 0 days    Minutes of Exercise per Session: Not on file  Stress: No Stress Concern Present (03/08/2018)   [employer] of Occupational Health - Occupational Stress Questionnaire    Feeling of Stress : Not at all  Social Connections: Moderately Integrated (05/25/2023)   Social Connection and Isolation Panel    Frequency of Communication with Friends and Family: More than three times a week    Frequency of Social Gatherings with Friends and Family: More than three times a week    Attends Religious Services: More than 4 times per year    Active Member of [employer] or Organizations: Yes    Attends Banker Meetings: 1 to 4 times per year    Marital Status: Divorced    Family History  Problem Relation Age of Onset   Diabetes Mother    Lumbar disc disease Mother    Migraines Mother    Hypertension Mother    Cancer - Prostate Father        mild   Diabetes Father     Hypertension Father    Breast cancer Sister 75   Ovarian cancer Neg Hx    Colon cancer Neg Hx    Heart disease Neg Hx    Kidney cancer Neg Hx    Bladder Cancer Neg Hx     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  On multiple psychotropic medications, and with DM>    Benign essential HTN 12/26/2014   Bipolar disorder (HCC)    Bronchitis    Carotid artery narrowing 12/22/2014   Cervical prolapse    CHF (congestive heart failure) (HCC)    CKD (chronic kidney disease)    Concussion 01/14/2014   Fall    COPD (chronic obstructive pulmonary disease) (HCC)    Coronary artery disease    Depression    Diabetes mellitus without complication (HCC)    Dialysis patient (HCC)    Diplopia    Dupuytren's contracture of foot    Essential (primary) hypertension 02/12/2015   Family history of thyroid  problem    GERD (gastroesophageal reflux disease)    Gout    Heart disease    enlarged because of COPD   Heart murmur    Hepatomegaly    HLD (hyperlipidemia)    Hyperactive airway disease    Hyperkalemia    Hypertension    Hypothyroidism    Irritable colon    Morbid obesity (HCC)    Multiple thyroid  nodules    OAB (overactive bladder)    Pernicious anemia    Post menopausal syndrome    Post-concussion syndrome 03/08/2014   Post-traumatic brain syndrome    Prolapsed uterus    PSVT (paroxysmal supraventricular tachycardia) (HCC)    Renal calculus    Renal disease    w/ GFR 29-may be due to diabetes   Seizures (HCC)    last seizure 2012. has had spells since with no knowledge. 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  Transient alteration of awareness 11/10/2012   Urinary  incontinence with continuous leakage 03/08/2014    Past Surgical History:  Procedure Laterality Date   A/V FISTULAGRAM Left 07/21/2016   Procedure: A/V Fistulagram;  Surgeon: Celso College, MD;  Location: [facility] INVASIVE CV LAB;  Service: Cardiovascular;  Laterality: Left;   A/V FISTULAGRAM Left 12/24/2016   Procedure: A/V Fistulagram;  Surgeon: Celso College, MD;  Location: [facility] INVASIVE CV LAB;  Service: Cardiovascular;  Laterality: Left;   A/V FISTULAGRAM Left 06/29/2017   Procedure: A/V FISTULAGRAM;  Surgeon: Celso College, MD;  Location: [facility] INVASIVE CV LAB;  Service: Cardiovascular;  Laterality: Left;   A/V FISTULAGRAM Left 03/08/2018   Procedure: A/V FISTULAGRAM;  Surgeon: Celso College, MD;  Location: [facility] INVASIVE CV LAB;  Service: Cardiovascular;  Laterality: Left;   A/V FISTULAGRAM Left 04/11/2019   Procedure: A/V FISTULAGRAM;  Surgeon: Celso College, MD;  Location: [facility] INVAS[facility] CV LAB;  Service: Cardiovascular;  Laterality: Left;   A/V FISTULAGRAM Left 06/25/2023   Procedure: A/V Fistulagram;  Surgeon: Celso College, MD;  Location: [facility]VAS[facility] CV LAB;  Service: Cardiovascular;  Laterality: Left;   A/V SHUNT INTERVENTION N/A 07/21/2016   Procedure: A/V Shunt Intervention;  Surgeon: Celso College, MD;  Location: [facility] INVASIVE CV LAB;  Service: Cardiovascular;  Laterality: N/A;   A/V SHUNT INTERVENTION N/A 12/24/2016   Procedure: A/V SHUNT INTERVENTION;  Surgeon: Celso College, MD;  Location: [facility] INVAS[facility] CV LAB;  Service: Cardiovascular;  Laterality: N/A;   AVF     BIOPSY  02/23/2023   Procedure: BIOPSY;  Surgeon: Shane Darling, MD;  Location: [facility] ENDOSCOPY;  Service: Endoscopy;;   BREAST BIOPSY Left 06/12/2022   LEFT stereo bx, calcs, X clip-BENIGN FIBROFATTY BREAST TISSUE WITH FOCAL FATTY DEGENERATION AND ASSOCIATED COARSE DYSTROPHIC CALCIFICATIONS. - NEGATIVE FOR ATYPICAL PROLIFERATIVE BREAST DISEASE.   BREAST BIOPSY Left 06/12/2022   MM LT BREAST BX W LOC DEV 1ST LESION  IMAGE BX SPEC STEREO GUIDE 06/12/2022 [facility]   CHOLECYSTECTOMY     COLONOSCOPY     COLONOSCOPY WITH PROPOFOL  N/A 03/18/2017   Procedure: COLONOSCOPY WITH PROPOFOL ;  Surgeon: Cassie Click, MD;  Location: [facility] ENDOSCOPY;  Service: Endoscopy;  Laterality: N/A;   COLONOSCOPY WITH PROPOFOL  N/A 02/23/2023   Procedure: COLONOSCOPY WITH PROPOFOL ;  Surgeon: Shane Darling, MD;  Location: [facility] ENDOSCOPY;  Service: Endoscopy;  Laterality: N/A;  DM & DIALYSIS PATIENT   ENDOMETRIAL BIOPSY     ablation, uterine   ESOPHAGEAL BRUSHING  02/23/2023   Procedure: ESOPHAGEAL BRUSHING;  Surgeon: Shane Darling, MD;  Location: [facility] ENDOSCOPY;  Service: Endoscopy;;   ESOPHAGOGASTRODUODENOSCOPY (EGD) WITH PROPOFOL  N/A 03/18/2017   Procedure: ESOPHAGOGASTRODUODENOSCOPY (EGD) WITH PROPOFOL ;  Surgeon: Cassie Click, MD;  Location: [facility] ENDOSCOPY;  Service: Endoscopy;  Laterality: N/A;   ESOPHAGOGASTRODUODENOSCOPY (EGD) WITH PROPOFOL  N/A 02/23/2023   Procedure: ESOPHAGOGASTRODUODENOSCOPY (EGD) WITH PROPOFOL ;  Surgeon: Shane Darling, MD;  Location: [facility] ENDOSCOPY;  Service: Endoscopy;  Laterality: N/A;   HERNIA REPAIR     HERNIA REPAIR     PERIPHERAL VASCULAR CATHETERIZATION N/A 08/23/2015   Procedure: Dialysis/Perma Catheter Insertion;  Surgeon: Celso College, MD;  Location: [facility] INVASIVE CV LAB;  Service: Cardiovascular;  Laterality: N/A;   PERIPHERAL VASCULAR CATHETERIZATION N/A 11/09/2015   Procedure: Dialysis/Perma Catheter Removal;  Surgeon: Jackquelyn Mass, MD;  Location: [facility] INVASIVE CV LAB;  Service: Cardiovascular;  Laterality: N/A;   PERIPHERAL VASCULAR CATHETERIZATION Left 04/23/2016   Procedure: A/V Fistulagram;  Surgeon: Celso College, MD;  Location: [facility] INVASIVE CV LAB;  Service: Cardiovascular;  Laterality: Left;   PORT A CATH REVISION       Current Outpatient Medications on File Prior to Visit  Medication Sig Dispense Refill   B Complex-C-Folic Acid  (DIALYVITE TABLET)  TABS Take 1 tablet by mouth daily.  12   clonazePAM  (KLONOPIN ) 1 MG tablet TAKE 1-2 TABLETS (1-2 MG TOTAL) BY MOUTH DAILY. (Patient taking differently: Take 1 mg by mouth daily.) 90 tablet 3   cloNIDine (CATAPRES - DOSED IN MG/24 HR) 0.2 mg/24hr patch Place 0.2 mg onto the skin once a week.     Continuous Blood Gluc Sensor (DEXCOM G6 SENSOR) MISC Use 1 every 10 days     Continuous Blood Gluc Transmit (DEXCOM G6 TRANSMITTER) MISC USE EVERY 3 MONTHS     ethosuximide  (ZARONTIN ) 250 MG/5ML solution TAKE 7. 5 ML BY MOUTH TWICE A DAY TAKE AFTER HD TREATMENT ON DIALYSIS DAY. 270 mL 2   famotidine  (PEPCID ) 40 MG tablet SMARTSIG:1 Tablet(s) By Mouth Every Evening     Febuxostat  80 MG TABS Take 80 mg by mouth daily.     fexofenadine (ALLEGRA) 180 MG tablet Take 180 mg by mouth daily.     fluticasone -salmeterol (ADVAIR) 250-50 MCG/ACT AEPB Inhale 1 puff into the lungs in the morning and at bedtime.     furosemide  (LASIX ) 40 MG tablet Take 40 mg by mouth 2 (two) times daily. Sun, Tues, Thurs, Sat     Glucagon, rDNA, (GLUCAGON EMERGENCY) 1 MG KIT INJECT 1 MG INTO THE MUSCLE AS DIRECTED     insulin  glargine (LANTUS ) 100 UNIT/ML injection Inject 16 Units into the skin daily.     insulin  lispro (HUMALOG) 100 UNIT/ML injection Inject 8-15 Units into the skin 3 (three) times daily with meals. Per sliding scale     LAMICTAL  200 MG tablet TAKE 1.5 TABLETS (300 MG TOTAL) BY MOUTH 2 (TWO) TIMES DAILY. GLAXO BRAND NAME 270 tablet 1   levETIRAcetam  (KEPPRA ) 250 MG tablet Take Bid on non -dialysis days, 3 a day on hemodialysis days. (Patient taking differently: Take by mouth 2 (two) times daily. Take Bid on non -dialysis days, 3 a day on hemodialysis days.) 270 tablet 5   levothyroxine  (SYNTHROID ) 150 MCG tablet Take 150 mcg by mouth daily before breakfast.      Methoxy PEG-Epoetin Beta (MIRCERA IJ) Inject as directed. Receives w/ dialysis, unsure how often right now     metoprolol  succinate (TOPROL -XL) 100 MG 24 hr tablet  TAKE 1 TABLET BY MOUTH IN THE MORNING AND AT BEDTIME WITH OR IMMEDIATELY FOLLOWING A MEAL 180 tablet 1   montelukast  (SINGULAIR ) 10 MG tablet SMARTSIG:1 Tablet(s) By Mouth Every Evening     pantoprazole  (PROTONIX ) 40 MG tablet Take 40 mg by mouth every morning.      Probiotic Product (ALIGN) 4 MG CAPS Take 4 mg by mouth daily.      UNIFINE PENTIPS 32G X 4 MM MISC USE UP TO 5 TIMES DAILY AS DIRECTED  3   valsartan  (DIOVAN ) 80 MG tablet Take 1 tablet (80 mg total) by mouth daily. (Patient taking differently: Take 80 mg by mouth 2 (two) times daily.) 180 tablet 3   No current facility-administered medications on file prior to visit.    Allergies  Allergen Reactions   Cinnamon Other (See Comments)    Raises blood pressure   Lamotrigine  Other (See Comments)    Generic lamotrigine  triggers seizures, grand mal. TOLERATES BRAND  LAMICTAL  ONLY (GSK)   Procrit [Epoetin Alfa-Epbx] Other (See Comments)    Seizures, grand mal   Ace Inhibitors Cough   Azithromycin Other (See Comments)    seizures   Iron  Other (See Comments)    Can not take in IV Form - causes seizures    Neomycin -Bacitracin  Zn-Polymyx Other (See Comments)    Unknown   Other Other (See Comments)    Pt reports Z-pack induces lip tingling, facial swelling   Sulfa Antibiotics Other (See Comments)    Due to risks for seizures.     DIAGNOSTIC DATA (LABS, IMAGING, TESTING) - I reviewed patient records, labs, notes, testing and imaging myself where available.  Lab Results  Component Value Date   WBC 10.1 10/16/2023   HGB 9.5 (L) 10/16/2023   HCT 27.9 (L) 10/16/2023   MCV 106.9 (H) 10/16/2023   PLT 87 (L) 10/16/2023      Component Value Date/Time   NA 132 (L) 10/16/2023 1600   NA 142 03/08/2014 1634   NA 141 07/04/2011 0747   K 3.1 (L) 10/16/2023 1600   K 4.6 07/04/2011 0747   CL 89 (L) 10/16/2023 1600   CL 109 (H) 07/04/2011 0747   CO2 30 10/16/2023 1600   CO2 20 (L) 07/04/2011 0747   GLUCOSE 258 (H) 10/16/2023 1600    GLUCOSE 198 (H) 07/04/2011 0747   BUN 13 10/16/2023 1600   BUN 82 (HH) 03/08/2014 1634   BUN 65 (H) 07/04/2011 0747   CREATININE 1.74 (H) 10/16/2023 1600   CREATININE 1.95 (H) 07/04/2011 0747   CALCIUM 8.2 (L) 10/16/2023 1600   CALCIUM 8.6 07/04/2011 0747   PROT 6.9 10/16/2023 1600   PROT 6.8 03/08/2014 1634   PROT 7.3 07/04/2011 0747   ALBUMIN 3.8 10/16/2023 1600   ALBUMIN 4.4 03/08/2014 1634   ALBUMIN 3.8 07/04/2011 0747   AST 21 10/16/2023 1600   AST 11 (L) 07/04/2011 0747   ALT 16 10/16/2023 1600   ALT 14 07/04/2011 0747   ALKPHOS 100 10/16/2023 1600   ALKPHOS 188 (H) 07/04/2011 0747   BILITOT 0.7 10/16/2023 1600   BILITOT 0.2 07/04/2011 0747   GFRNONAA 33 (L) 10/16/2023 1600   GFRNONAA 29 (L) 07/04/2011 0747   GFRAA 15 (L) 01/29/2019 1431   GFRAA 35 (L) 07/04/2011 0747   No results found for: CHOL, HDL, LDLCALC, LDLDIRECT, TRIG, CHOLHDL Lab Results  Component Value Date   HGBA1C 6.1 (H) 05/26/2023   No results found for: VITAMINB12 Lab Results  Component Value Date   TSH 2.682 10/03/2023    PHYSICAL EXAM:  Vitals:   10/19/23 1307  BP: (!) 150/57  Pulse: 60   No data found. Body mass index is 34.39 kg/m.   Wt Readings from Last 3 Encounters:  10/19/23 188 lb (85.3 kg)  10/13/23 188 lb 9.6 oz (85.5 kg)  10/04/23 183 lb 6.8 oz (83.2 kg)     Ht Readings from Last 3 Encounters:  10/19/23 5' 2 (1.575 m)  10/03/23 5' 2 (1.575 m)  09/27/23 5' 2 (1.575 m)      General: The patient is awake, alert and appears not in acute distress. The patient is groomed. Head: Normocephalic, atraumatic. Neck is supple.  16  Mallampati 2,  Nasal airflow is not fully patent.   Cardiovascular:  Regular rate and cardiac rhythm by pulse, without distended neck veins. Respiratory: Lungs are clear to auscultation.  Skin:  With evidence of ankle edema,  right leg lesion.  Trunk:  NEUROLOGIC EXAM: The patient is awake and alert, oriented to place and  time.   Memory subjective described as intact.  Attention span & concentration ability appears normal.  Speech is fluent,  without  dysarthria, dysphonia or aphasia.  Mood and affect are appropriate.   Cranial nerves: no loss of smell or taste reported  Pupils are equal and briskly reactive to light. Funduscopic exam deferred.  Extraocular movements in vertical and horizontal planes were intact and without nystagmus. No Diplopia.  Facial motor strength is symmetric and tongue and uvula move midline.  Neck ROM : rotation, tilt and flexion extension were normal for age and shoulder shrug was symmetrical.    Motor exam:  Symmetric bulk, tone and ROM.   Normal tone without cog wheeling, symmetric grip strength . Deep tendon reflexes: in the upper and lower extremities are symmetric and intact.   ASSESSMENT AND PLAN :   63 y.o. year - old female HD dependent  patient here with:    1)   tachycardia ,  hypoxia,  WBC elevated after fall an infection -  I am not sure that this was keppra  induced   2) Zarontin / 5 ml bid po, and next month 2.5 ml BID po. If tolerated  If seizures or confusional spells occur,  go back on last dose immediately.  In August on 0 .   3) Echo cardiogram and    4) HD dependent , and this makes her prone to encephalopathic- changes.    I would like to thank Claudius Cumins Rosalynn Come, MD and Yehuda Helms, Md [address] [facility] [place],  [place] [address] for allowing me to meet with and to take care of this pleasant patient.   The patient's condition requires frequent monitoring and adjustments in the treatment plan, reflecting the ongoing complexity of care.  This provider is the continuing focal point for all needed services for this condition.  After spending a total time of  35  minutes face to face and time for  history taking, physical and neurologic examination, review of laboratory studies,  personal review of imaging studies, reports and  results of other testing and review of referral information / records as far as provided in visit,   Electronically signed by: [name], MD 10/19/2023 1:31 PM  [facility] Board certified by The [employer] of Sleep Medicine and Diplomate of the [employer] of Sleep Medicine. Board certified In Neurology through the ABPN, Fellow of the [employer] of Neu[employer].

## 2023-10-19 NOTE — Patient Instructions (Addendum)
   ASSESSMENT AND PLAN :   63 -year - old female HD dependent  patient here with:    1)  spells of  tachycardia ,  hypoxia,  WBC elevated after fall an infection -  I am not sure that this was keppra  induced , 3 ED visits in a short time.   2)  Going to stay on Keppra , but reduced Zarontin /  to 5 ml bid po, and next month to  2.5 ml BID po.  If seizures or confusional spells occur,  go back on last dose immediately.   3) Echo cardiogram , referral to cardiologist.   4) HD dependent , and this makes her prone to encephalopathic- changes.  CT  head normal.      Nina Banner, MD

## 2023-10-21 NOTE — Progress Notes (Signed)
 Carelink Summary Report / Loop Recorder

## 2023-10-31 ENCOUNTER — Other Ambulatory Visit: Payer: Self-pay | Admitting: Neurology

## 2023-10-31 DIAGNOSIS — G40209 Localization-related (focal) (partial) symptomatic epilepsy and epileptic syndromes with complex partial seizures, not intractable, without status epilepticus: Secondary | ICD-10-CM

## 2023-10-31 DIAGNOSIS — R270 Ataxia, unspecified: Secondary | ICD-10-CM

## 2023-10-31 DIAGNOSIS — R4789 Other speech disturbances: Secondary | ICD-10-CM

## 2023-11-05 ENCOUNTER — Ambulatory Visit

## 2023-11-05 DIAGNOSIS — Z95818 Presence of other cardiac implants and grafts: Secondary | ICD-10-CM

## 2023-11-05 LAB — CUP PACEART REMOTE DEVICE CHECK
Date Time Interrogation Session: 20250702234239
Implantable Pulse Generator Implant Date: 20240619

## 2023-11-12 ENCOUNTER — Telehealth: Payer: Self-pay

## 2023-11-12 NOTE — Telephone Encounter (Signed)
 Pt's mother has returned call to [place], [place]

## 2023-11-12 NOTE — Telephone Encounter (Signed)
 Attempted to call Pt/Pt mother regarding titration of medication ethosuximide  (Zarontin ). No answer, LVM for call back.

## 2023-11-12 NOTE — Telephone Encounter (Signed)
 Pt mother called back and stated that she and Pt aware of titration dosing and Pt will begin reduced dose of 2.5 mL on 11/18/23. Pt mother voiced thanks for the call.

## 2023-11-14 ENCOUNTER — Ambulatory Visit: Payer: Self-pay | Admitting: Cardiology

## 2023-11-20 ENCOUNTER — Encounter: Payer: Self-pay | Admitting: Neurology

## 2023-11-24 NOTE — Progress Notes (Signed)
 Carelink Summary Report / Loop Recorder

## 2023-11-26 ENCOUNTER — Other Ambulatory Visit: Payer: Self-pay | Admitting: Internal Medicine

## 2023-11-26 DIAGNOSIS — R1084 Generalized abdominal pain: Secondary | ICD-10-CM

## 2023-11-26 NOTE — Telephone Encounter (Signed)
 Spoke w/Pt mother regarding the spells described in [facility] message. Mother stated the spells are the type of abnormal seizures Pt has where she stares but is unable to speak or hear. Since decreasing ethosuximide  to 5 mg Pt had two of the spells within a week, the last one being on 7/18. Mother stated the spells seem to happen on dialysis days and that recently Pt has had anxiety about going to dialysis which has not been the case in the past. She also reported the Pt has not been taking a 3rd dose of Keppra  as the script sig states. Also the Pt was supposed to decrease the dose of ethosuximide  to 2.5 mg last week but has not yet d/t the spells that occurred, however she reported the Pt has not had another episode since the one on Friday 7/18. Informed Mother will speak with Dr. Chalice for any recommendations she may have. Pt mother voiced understanding and thanks for the call.

## 2023-12-03 ENCOUNTER — Ambulatory Visit
Admission: RE | Admit: 2023-12-03 | Discharge: 2023-12-03 | Disposition: A | Discharge status: Home / Self Care | Source: Ambulatory Visit | Attending: Internal Medicine | Admitting: Internal Medicine

## 2023-12-03 DIAGNOSIS — R1084 Generalized abdominal pain: Secondary | ICD-10-CM | POA: Diagnosis present

## 2023-12-07 ENCOUNTER — Ambulatory Visit

## 2023-12-07 DIAGNOSIS — Z8679 Personal history of other diseases of the circulatory system: Secondary | ICD-10-CM | POA: Diagnosis not present

## 2023-12-08 LAB — CUP PACEART REMOTE DEVICE CHECK
Date Time Interrogation Session: 20250802231925
Implantable Pulse Generator Implant Date: 20240619

## 2023-12-09 ENCOUNTER — Ambulatory Visit: Payer: Self-pay | Admitting: Cardiology

## 2023-12-17 ENCOUNTER — Other Ambulatory Visit: Payer: Self-pay | Admitting: Cardiology

## 2023-12-17 NOTE — Telephone Encounter (Signed)
 Please contact pt for future appointment. Pt due for follow up.

## 2023-12-18 NOTE — Telephone Encounter (Signed)
Pt scheduled on 9/23

## 2023-12-29 ENCOUNTER — Other Ambulatory Visit: Payer: Self-pay | Admitting: Neurology

## 2023-12-29 DIAGNOSIS — G40209 Localization-related (focal) (partial) symptomatic epilepsy and epileptic syndromes with complex partial seizures, not intractable, without status epilepticus: Secondary | ICD-10-CM

## 2023-12-29 DIAGNOSIS — R4789 Other speech disturbances: Secondary | ICD-10-CM

## 2023-12-29 DIAGNOSIS — R270 Ataxia, unspecified: Secondary | ICD-10-CM

## 2024-01-05 ENCOUNTER — Other Ambulatory Visit: Payer: Self-pay

## 2024-01-06 NOTE — Telephone Encounter (Signed)
 Spoke w/Pt mother regarding dose of ethosuximide . Mother stated she has not reduced the medication to the 2.5 mg and is reluctant to do so. She stated the Pt had another episode last Thursday, which was a dialysis day, and is requesting for Pt to stay on the 5 mg dose. Informed Pt mother will make provider aware. Mother voiced understanding and thanks for the call.

## 2024-01-07 ENCOUNTER — Ambulatory Visit

## 2024-01-07 DIAGNOSIS — I493 Ventricular premature depolarization: Secondary | ICD-10-CM | POA: Diagnosis not present

## 2024-01-07 LAB — CUP PACEART REMOTE DEVICE CHECK
Date Time Interrogation Session: 20250903233134
Implantable Pulse Generator Implant Date: 20240619

## 2024-01-09 ENCOUNTER — Ambulatory Visit: Payer: Self-pay | Admitting: Cardiology

## 2024-01-18 NOTE — Progress Notes (Signed)
 Remote Loop Recorder Transmission

## 2024-01-26 ENCOUNTER — Ambulatory Visit: Attending: Cardiology | Admitting: Cardiology

## 2024-01-26 VITALS — BP 138/62 | HR 74 | Ht 62.0 in | Wt 187.4 lb

## 2024-01-26 DIAGNOSIS — R0789 Other chest pain: Secondary | ICD-10-CM | POA: Insufficient documentation

## 2024-01-26 DIAGNOSIS — R011 Cardiac murmur, unspecified: Secondary | ICD-10-CM | POA: Insufficient documentation

## 2024-01-26 DIAGNOSIS — Z95818 Presence of other cardiac implants and grafts: Secondary | ICD-10-CM | POA: Insufficient documentation

## 2024-01-26 DIAGNOSIS — Z8679 Personal history of other diseases of the circulatory system: Secondary | ICD-10-CM | POA: Diagnosis present

## 2024-01-26 NOTE — Patient Instructions (Signed)
 Medication Instructions:  Your physician recommends that you continue on your current medications as directed. Please refer to the Current Medication list given to you today.   *If you need a refill on your cardiac medications before your next appointment, please call your pharmacy*  Lab Work: No labs ordered today  If you have labs (blood work) drawn today and your tests are completely normal, you will receive your results only by: MyChart Message (if you have MyChart) OR A paper copy in the mail If you have any lab test that is abnormal or we need to change your treatment, we will call you to review the results.  Testing/Procedures: Your physician has requested that you have an echocardiogram. Echocardiography is a painless test that uses sound waves to create images of your heart. It provides your doctor with information about the size and shape of your heart and how well your heart's chambers and valves are working.   You may receive an ultrasound enhancing agent through an IV if needed to better visualize your heart during the echo. This procedure takes approximately one hour.  There are no restrictions for this procedure.  This will take place at [address] [facility] [address], [place] [address]  Please note: We ask at that you not bring children with you during ultrasound (echo/ vascular) testing. Due to room size and safety concerns, children are not allowed in the ultrasound rooms during exams. Our front office staff cannot provide observation of children in our lobby area while testing is being conducted. An adult accompanying a patient to their appointment will only be allowed in the ultrasound room at the discretion of the ultrasound technician under special circumstances. We apologize for any inconvenience.   Follow-Up: At [facility], you and your health needs are our priority.  As part of our continuing mission to provide you with exceptional heart  care, our providers are all part of one team.  This team includes your primary Cardiologist (physician) and Advanced Practice Providers or APPs (Physician Assistants and Nurse Practitioners) who all work together to provide you with the care you need, when you need it.  Your next appointment:   6 month(s)  Provider:   Dr Cindie

## 2024-01-26 NOTE — Progress Notes (Signed)
 Electrophysiology Clinic Note    Date:  01/26/2024  Patient ID:  Nina Johnston, Nina Johnston 1961/02/16, MRN [id number] PCP:  Auston Reyes BIRCH, MD  Cardiologist:  None   Electrophysiologist:  OLE ONEIDA HOLTS, MD    Discussed the use of AI scribe software for clinical note transcription with the patient, who gave verbal consent to proceed.   Patient Profile    Chief Complaint: AFib, ILR follow-up  History of Present Illness: Nina Johnston is a 63 y.o. female with PMH notable for AFib, ESRD on HD, OSA on CPAP, HTN, hypothyroid, T2DM ; seen today for OLE ONEIDA HOLTS, MD for routine electrophysiology followup.  She last saw Dr.Lambert 10/2022 where ILR implanted to further eval AFib burden and eliquis  was stopped.  I last saw her 07/2023 where she was concerned about abnormal rhythm at HD, PVC burden was 1.5% on ILR. No AFib on IRL.   On follow-up today, she continues to have intermittent chest discomfort sometimes associated with HD, sometimes associated with activity but also happens at rest. She has been to ER several times for this without cause identified as of yet.  She also intermittently has low BP after HD sessions. PCP and nephrology are adjusting BP meds and fluid removal during HD sessions. She denies palpitations, chest pain currently, chest pressure. No current dizziness, presyncope.     Arrhythmia/Device History MDT ILR, imp 10/2022; dx afib monitoring    ROS:  Please see the history of present illness. All other systems are reviewed and otherwise negative.    Physical Exam    VS:  BP 138/62 (BP Location: Right Arm, Patient Position: Sitting, Cuff Size: Normal)   Pulse 74   Ht 5' 2 (1.575 m)   Wt 187 lb 6.4 oz (85 kg)   SpO2 98%   BMI 34.28 kg/m  BMI: Body mass index is 34.28 kg/m.      Wt Readings from Last 3 Encounters:  01/26/24 187 lb 6.4 oz (85 kg)  10/19/23 188 lb (85.3 kg)  10/13/23 188 lb 9.6 oz (85.5 kg)     GEN- The patient is well  appearing, alert and oriented x 3 today.   Lungs- Clear to ausculation bilaterally, normal work of breathing.  Heart- Regular rate and rhythm, murmur appreciated  Extremities- Trace peripheral edema, warm, dry   01/26/2024 remote interrogation reviewed by myself:  Battery good No AFib/AT episodes 0.2% PVC burden   Studies Reviewed   Previous EP, cardiology notes.    EKG is not ordered. Personal review of EKG from 10/16/2023 shows:  SR at 72, QT 460, QTC 502        TTE, 05/26/2023  1. Left ventricular ejection fraction, by estimation, is 55 to 60%. The left ventricle has normal function. The left ventricle has no regional wall motion abnormalities. Left ventricular diastolic parameters are consistent with Grade II diastolic dysfunction (pseudonormalization).   2. Right ventricular systolic function is normal. The right ventricular size is normal. There is normal pulmonary artery systolic pressure. The estimated right ventricular systolic pressure is 20.7 mmHg.   3. Left atrial size was mildly dilated.   4. The mitral valve is grossly normal. Mild mitral valve regurgitation.   5. The aortic valve is normal in structure. Aortic valve regurgitation is not visualized. No aortic stenosis is present.    Assessment and Plan     #) SVT #) history of AFib #) ILR in situ No AFib episodes on ILR Continue monthly remote  monitoring  #) murmur #) intermittent chest discomfort New murmur appreciated on exam Update TTE        Current medicines are reviewed at length with the patient today.   The patient does not have concerns regarding her medicines.  The following changes were made today:  none  Labs/ tests ordered today include:  Orders Placed This Encounter  Procedures   ECHOCARDIOGRAM COMPLETE     Disposition: Follow up with Dr. Cindie or EP APP in 6 months, sooner as needed   Signed, Taedyn Glasscock, NP  01/26/24  3:28 PM  Electrophysiology [facility]

## 2024-02-01 NOTE — Progress Notes (Signed)
 Remote Loop Recorder Transmission

## 2024-02-08 ENCOUNTER — Encounter

## 2024-02-08 ENCOUNTER — Ambulatory Visit (INDEPENDENT_AMBULATORY_CARE_PROVIDER_SITE_OTHER)

## 2024-02-08 DIAGNOSIS — Z8679 Personal history of other diseases of the circulatory system: Secondary | ICD-10-CM

## 2024-02-08 LAB — CUP PACEART REMOTE DEVICE CHECK
Date Time Interrogation Session: 20251005233015
Implantable Pulse Generator Implant Date: 20240619

## 2024-02-09 NOTE — Progress Notes (Signed)
 Remote Loop Recorder Transmission

## 2024-02-10 ENCOUNTER — Ambulatory Visit: Payer: Self-pay | Admitting: Cardiology

## 2024-02-19 ENCOUNTER — Telehealth: Payer: Self-pay | Admitting: Neurology

## 2024-02-19 DIAGNOSIS — G40209 Localization-related (focal) (partial) symptomatic epilepsy and epileptic syndromes with complex partial seizures, not intractable, without status epilepticus: Secondary | ICD-10-CM

## 2024-02-19 DIAGNOSIS — R270 Ataxia, unspecified: Secondary | ICD-10-CM

## 2024-02-19 DIAGNOSIS — R4789 Other speech disturbances: Secondary | ICD-10-CM

## 2024-02-19 MED ORDER — ETHOSUXIMIDE 250 MG/5ML PO SOLN: ORAL | 1 refills | Status: DC

## 2024-02-19 NOTE — Telephone Encounter (Signed)
 Spoke to mother (checked DPR ) informed mother that refill request was sent to Pharmacy requested

## 2024-02-19 NOTE — Telephone Encounter (Signed)
 Pt mother called to request medication refill for Pt. Pt is out of medication  and stated that Pharmacy inform Pt mother there were no refills left for Pt medication   ethosuximide  (ZARONTIN ) 250 MG/5ML solution  Pt Pharamcy is  [facility] [address], [place] - [address] (Ph: [phone])

## 2024-03-03 ENCOUNTER — Other Ambulatory Visit: Payer: Self-pay | Admitting: Physician Assistant

## 2024-03-03 ENCOUNTER — Ambulatory Visit
Admission: RE | Admit: 2024-03-03 | Discharge: 2024-03-03 | Disposition: A | Discharge status: Home / Self Care | Source: Ambulatory Visit | Attending: Physician Assistant | Admitting: Physician Assistant

## 2024-03-03 ENCOUNTER — Ambulatory Visit: Attending: Cardiology

## 2024-03-03 DIAGNOSIS — R4182 Altered mental status, unspecified: Secondary | ICD-10-CM | POA: Insufficient documentation

## 2024-03-03 DIAGNOSIS — R011 Cardiac murmur, unspecified: Secondary | ICD-10-CM | POA: Diagnosis present

## 2024-03-03 LAB — ECHOCARDIOGRAM COMPLETE
AR max vel: 2.79 cm2
AV Area VTI: 3.09 cm2
AV Area mean vel: 2.9 cm2
AV Mean grad: 8 mmHg
AV Peak grad: 14.4 mmHg
Ao pk vel: 1.9 m/s
Area-P 1/2: 3.37 cm2
S' Lateral: 3.8 cm

## 2024-03-04 ENCOUNTER — Ambulatory Visit: Payer: Self-pay | Admitting: Cardiology

## 2024-03-10 ENCOUNTER — Ambulatory Visit (INDEPENDENT_AMBULATORY_CARE_PROVIDER_SITE_OTHER)

## 2024-03-10 ENCOUNTER — Encounter

## 2024-03-10 DIAGNOSIS — I493 Ventricular premature depolarization: Secondary | ICD-10-CM

## 2024-03-10 LAB — CUP PACEART REMOTE DEVICE CHECK
Date Time Interrogation Session: 20251105234453
Implantable Pulse Generator Implant Date: 20240619

## 2024-03-11 NOTE — Progress Notes (Signed)
 Remote Loop Recorder Transmission

## 2024-03-13 ENCOUNTER — Other Ambulatory Visit: Payer: Self-pay | Admitting: Cardiology

## 2024-03-14 ENCOUNTER — Ambulatory Visit: Payer: Self-pay | Admitting: Cardiology

## 2024-03-18 ENCOUNTER — Other Ambulatory Visit: Payer: Self-pay | Admitting: Neurology

## 2024-03-18 DIAGNOSIS — G40209 Localization-related (focal) (partial) symptomatic epilepsy and epileptic syndromes with complex partial seizures, not intractable, without status epilepticus: Secondary | ICD-10-CM

## 2024-03-18 DIAGNOSIS — R270 Ataxia, unspecified: Secondary | ICD-10-CM

## 2024-03-18 DIAGNOSIS — R4789 Other speech disturbances: Secondary | ICD-10-CM

## 2024-03-21 NOTE — Telephone Encounter (Signed)
 Last filled by patient : 03/11/24 with 0 refills  Last office visit : 10/19/23 Next office visit : 06/07/24

## 2024-03-29 ENCOUNTER — Emergency Department: Admission: EM | Admit: 2024-03-29 | Discharge: 2024-03-29 | Disposition: A | Discharge status: Home / Self Care

## 2024-03-29 ENCOUNTER — Emergency Department

## 2024-03-29 DIAGNOSIS — N186 End stage renal disease: Secondary | ICD-10-CM | POA: Insufficient documentation

## 2024-03-29 DIAGNOSIS — R739 Hyperglycemia, unspecified: Secondary | ICD-10-CM

## 2024-03-29 DIAGNOSIS — Z992 Dependence on renal dialysis: Secondary | ICD-10-CM | POA: Insufficient documentation

## 2024-03-29 DIAGNOSIS — I471 Supraventricular tachycardia, unspecified: Secondary | ICD-10-CM | POA: Diagnosis not present

## 2024-03-29 DIAGNOSIS — J449 Chronic obstructive pulmonary disease, unspecified: Secondary | ICD-10-CM | POA: Diagnosis not present

## 2024-03-29 DIAGNOSIS — I959 Hypotension, unspecified: Secondary | ICD-10-CM | POA: Diagnosis not present

## 2024-03-29 DIAGNOSIS — I509 Heart failure, unspecified: Secondary | ICD-10-CM | POA: Insufficient documentation

## 2024-03-29 DIAGNOSIS — I2510 Atherosclerotic heart disease of native coronary artery without angina pectoris: Secondary | ICD-10-CM | POA: Diagnosis not present

## 2024-03-29 DIAGNOSIS — R000 Tachycardia, unspecified: Secondary | ICD-10-CM | POA: Diagnosis present

## 2024-03-29 LAB — CBC WITH DIFFERENTIAL/PLATELET
Abs Immature Granulocytes: 0.14 K/uL — ABNORMAL HIGH (ref 0.00–0.07)
Basophils Absolute: 0.1 K/uL (ref 0.0–0.1)
Basophils Relative: 1 %
Eosinophils Absolute: 0 K/uL (ref 0.0–0.5)
Eosinophils Relative: 0 %
HCT: 31.8 % — ABNORMAL LOW (ref 36.0–46.0)
Hemoglobin: 10.2 g/dL — ABNORMAL LOW (ref 12.0–15.0)
Immature Granulocytes: 1 %
Lymphocytes Relative: 17 %
Lymphs Abs: 2 K/uL (ref 0.7–4.0)
MCH: 34.9 pg — ABNORMAL HIGH (ref 26.0–34.0)
MCHC: 32.1 g/dL (ref 30.0–36.0)
MCV: 108.9 fL — ABNORMAL HIGH (ref 80.0–100.0)
Monocytes Absolute: 0.6 K/uL (ref 0.1–1.0)
Monocytes Relative: 5 %
Neutro Abs: 8.8 K/uL — ABNORMAL HIGH (ref 1.7–7.7)
Neutrophils Relative %: 76 %
Platelets: 123 K/uL — ABNORMAL LOW (ref 150–400)
RBC: 2.92 MIL/uL — ABNORMAL LOW (ref 3.87–5.11)
RDW: 14.3 % (ref 11.5–15.5)
WBC: 11.6 K/uL — ABNORMAL HIGH (ref 4.0–10.5)
nRBC: 0 % (ref 0.0–0.2)

## 2024-03-29 LAB — COMPREHENSIVE METABOLIC PANEL WITH GFR
ALT: 16 U/L (ref 0–44)
AST: 19 U/L (ref 15–41)
Albumin: 4.1 g/dL (ref 3.5–5.0)
Alkaline Phosphatase: 156 U/L — ABNORMAL HIGH (ref 38–126)
Anion gap: 16 — ABNORMAL HIGH (ref 5–15)
BUN: 13 mg/dL (ref 8–23)
CO2: 27 mmol/L (ref 22–32)
Calcium: 8.6 mg/dL — ABNORMAL LOW (ref 8.9–10.3)
Chloride: 95 mmol/L — ABNORMAL LOW (ref 98–111)
Creatinine, Ser: 2.6 mg/dL — ABNORMAL HIGH (ref 0.44–1.00)
GFR, Estimated: 20 mL/min — ABNORMAL LOW (ref >60)
Glucose, Bld: 429 mg/dL — ABNORMAL HIGH (ref 70–99)
Potassium: 3.2 mmol/L — ABNORMAL LOW (ref 3.5–5.1)
Sodium: 138 mmol/L (ref 135–145)
Total Bilirubin: 0.3 mg/dL (ref 0.0–1.2)
Total Protein: 6.8 g/dL (ref 6.5–8.1)

## 2024-03-29 LAB — TROPONIN T, HIGH SENSITIVITY
Troponin T High Sensitivity: 41 ng/L — ABNORMAL HIGH (ref 0–19)
Troponin T High Sensitivity: 51 ng/L — ABNORMAL HIGH (ref 0–19)

## 2024-03-29 LAB — PRO BRAIN NATRIURETIC PEPTIDE: Pro Brain Natriuretic Peptide: 10689 pg/mL — ABNORMAL HIGH (ref <300.0)

## 2024-03-29 LAB — CBG MONITORING, ED: Glucose-Capillary: 267 mg/dL — ABNORMAL HIGH (ref 70–99)

## 2024-03-29 MED ORDER — SODIUM CHLORIDE 0.9 % IV BOLUS
500.0000 mL | Freq: Once | INTRAVENOUS | Status: AC
Start: 2024-03-29 — End: 2024-03-29
  Administered 2024-03-29: 500 mL via INTRAVENOUS

## 2024-03-29 MED ORDER — ADENOSINE 6 MG/2ML IV SOLN
6.0000 mg | Freq: Once | INTRAVENOUS | Status: AC
  Administered 2024-03-29: 6 mg via INTRAVENOUS
  Filled 2024-03-29: qty 2

## 2024-03-29 NOTE — ED Notes (Signed)
 Lab called again for trop add on which has not been received as well as new add on BNP. Spoke with Brittany and Congress.

## 2024-03-29 NOTE — ED Notes (Signed)
 Pt's family at the bedside.

## 2024-03-29 NOTE — ED Provider Notes (Signed)
 [facility] Provider Note    Event Date/Time   First MD Initiated Contact with Patient 03/29/24 1737     (approximate)   History   Hypotension  Pt BIB [facility] from dialysis due to the patients ride (parents) not wanting to take the patient home after they were informed her VS were 96/54 and HR 130's. Pt has no complaints at this time. MD Clarine at triage.   HPI Nina Johnston is a 63 y.o. female PMH PSVT, PAF hyperlipidemia, CAD, COPD, ESRD on dialysis, CHF, bipolar disorder presents for evaluation of reported low blood pressure and tachycardia after dialysis -Patient's parents were reportedly picking her up from dialysis, noted that she had a systolic blood pressure in the 90s to low 100s as well as some tachycardia to the 130s so called an ambulance for evaluation -Patient is asymptomatic with no complaints.  Finished her dialysis session.   Reviewed recent visits including cardiology note on 01/26/2024.  Note that patient continues to have intermittent chest discomfort sometimes associated with HD and that she intermittently has low blood pressures after HD sessions, nephrology is reportedly monitoring this closely.  Last echo in January of this year.  EF 55-60%.  Grade 2 diastolic dysfunction.  No significant valvular abnormality.  Does have a implanted loop recorder in place.  Also per chart review, appears patient had a very similar presentation in May of this year.  Found to be in SVT at that time with a heart rate of about 135.  Ultimately treated with Cardizem  and admitted.  Appears she is on metoprolol  at baseline.     Physical Exam   Triage Vital Signs: BP 128/64   Pulse 87   Temp 98.9 F (37.2 C) (Oral)   Resp (!) 21   Ht 5' 2 (1.575 m)   Wt 84.8 kg   SpO2 98%   BMI 34.20 kg/m    Most recent vital signs: Vitals:   03/29/24 1832 03/29/24 1900  BP: (!) 143/65 128/64  Pulse: 92 87  Resp: (!) 30 (!) 21  Temp:    SpO2: 96% 98%      General: Awake, no distress.  CV:  Good peripheral perfusion.  Tachycardic, regular rhythm, RP 2+. AVF LUE +thrill.  Resp:  Normal effort. CTAB Abd:  No distention. Nontender to deep palpation throughout Other:  No significant lower extremity edema appreciated   ED Results / Procedures / Treatments   Labs (all labs ordered are listed, but only abnormal results are displayed) Labs Reviewed  CBC WITH DIFFERENTIAL/PLATELET - Abnormal; Notable for the following components:      Result Value   WBC 11.6 (*)    RBC 2.92 (*)    Hemoglobin 10.2 (*)    HCT 31.8 (*)    MCV 108.9 (*)    MCH 34.9 (*)    Platelets 123 (*)    Neutro Abs 8.8 (*)    Abs Immature Granulocytes 0.14 (*)    All other components within normal limits  COMPREHENSIVE METABOLIC PANEL WITH GFR - Abnormal; Notable for the following components:   Potassium 3.2 (*)    Chloride 95 (*)    Glucose, Bld 429 (*)    Creatinine, Ser 2.60 (*)    Calcium 8.6 (*)    Alkaline Phosphatase 156 (*)    GFR, Estimated 20 (*)    Anion gap 16 (*)    All other components within normal limits  PRO BRAIN NATRIURETIC PEPTIDE - Abnormal; Notable  for the following components:   Pro Brain Natriuretic Peptide 10,689.0 (*)    All other components within normal limits  CBG MONITORING, ED - Abnormal; Notable for the following components:   Glucose-Capillary 267 (*)    All other components within normal limits  TROPONIN T, HIGH SENSITIVITY - Abnormal; Notable for the following components:   Troponin T High Sensitivity 41 (*)    All other components within normal limits  TROPONIN T, HIGH SENSITIVITY - Abnormal; Notable for the following components:   Troponin T High Sensitivity 51 (*)    All other components within normal limits     EKG  Ecg = A-fib RVR versus slow SVT, no gross ST elevation or depression, no significant repolarization abnormality, normal axis.  No clear evidence of ischemia on my read.  Unclear  rhythm.   RADIOLOGY Radiology interpreted by myself and radiology report reviewed.  Evidence of mild vascular congestion.  No other acute pathology identified.    PROCEDURES:  Critical Care performed: Yes, see critical care procedure note(s)  .Critical Care  Performed by: Clarine Ozell LABOR, MD Authorized by: Clarine Ozell LABOR, MD   Critical care provider statement:    Critical care time (minutes):  30   Critical care time was exclusive of:  Separately billable procedures and treating other patients   Critical care was necessary to treat or prevent imminent or life-threatening deterioration of the following conditions:  Cardiac failure   Critical care was time spent personally by me on the following activities:  Development of treatment plan with patient or surrogate, discussions with consultants, evaluation of patient's response to treatment, examination of patient, ordering and review of laboratory studies, ordering and review of radiographic studies, ordering and performing treatments and interventions, pulse oximetry, re-evaluation of patient's condition and review of old charts   I assumed direction of critical care for this patient from another provider in my specialty: no      MEDICATIONS ORDERED IN ED: Medications  sodium chloride  0.9 % bolus 500 mL (0 mLs Intravenous Stopped 03/29/24 1957)  adenosine  (ADENOCARD ) 6 MG/2ML injection 6 mg (6 mg Intravenous Given 03/29/24 1830)     IMPRESSION / MDM / ASSESSMENT AND PLAN / ED COURSE  I reviewed the triage vital signs and the nursing notes.                              DDX/MDM/AP: Differential diagnosis includes, but is not limited to, primary arrhythmia, consider underlying interval anemia, electrolyte abnormality, doubt infection given negative review of symptoms, consider ACS, clinically doubt PE with no associated symptomatology.  Plan: -Labs  - Cardiac monitor - Chest x-ray - Small bolus IV fluid - EKG  Patient's  presentation is most consistent with acute presentation with potential threat to life or bodily function.  The patient is on the cardiac monitor to evaluate for evidence of arrhythmia and/or significant heart rate changes.  ED course below.  Ultimately felt patient likely has SVT, appears adenosine  have been avoided on a prior hospitalization for similar presentations discussed with cardiology who did recommend proceeding with adenosine  at this time--chemically cardioverted with single dose of 6 mg adenosine .  Remained in sinus rhythm for the rest of her ED stay.  Very mild troponin elevation that is stable on repeat, suspect due to cardiac strain from prior tachycardia.  Pressures remained stable here and patient is otherwise asymptomatic.  No evidence of underlying infection at this time.  He is already on metoprolol .  Appears dialysis has been a trigger for this dysrhythmia in the past.  Recommend she follow-up with her primary care provider, cardiologist, and nephrologist for ongoing management.  Next dialysis session is scheduled for this Friday due to the holiday, does take Lasix  in the interim.  Chest x-ray with some evidence of mild vascular congestion, satting well on room air with normal work of breathing here, no orthopnea, stable for outpatient management.  Was hyperglycemic to the 400s on arrival though otherwise not consistent with DKA, glucose appropriately downtrending after IV fluid.  Discharged home with family and plan for close patient follow-up.  ED return precautions. Agrees with plan.  Clinical Course as of 03/29/24 2135  Tue Mar 29, 2024  1802 Cardiology paged to discuss [MM]  1803 CBC with very mild leukocytosis, mildly improved anemia [MM]  1807 D/w Dr. Cherrie of cardiology - Appears patient is primarily followed by [facility] clinic though electrophysiology of [facility] has been consulting -His initial thought is to use adenosine  to break rhythm but recommends discussing with  [facility] cardiology prior  Paging [facility] cardiology [MM]  1815 D/w Dr. Dewane of [facility] cards - agrees w/ trial of adenosine   Will proceed [MM]  1841 Patient converted to normal sinus rhythm with single dose of adenosine .  No hypotension. [MM]  1841 CMP reviewed, within baseline range, hyperglycemia though bicarb normal, not consistent with DKA  Getting IV fluid, will recheck and potentially give small dose of insulin  as needed [MM]  2007 Initial troponin very mildly elevated [MM]  2044 Rpt trop stable [MM]  2132 Rpt glucose 267 [MM]    Clinical Course User Index [MM] Clarine Ozell LABOR, MD     FINAL CLINICAL IMPRESSION(S) / ED DIAGNOSES   Final diagnoses:  SVT (supraventricular tachycardia)  Hypotension, unspecified hypotension type  Hyperglycemia     Rx / DC Orders   ED Discharge Orders     None        Note:  This document was prepared using Dragon voice recognition software and may include unintentional dictation errors.   Clarine Ozell LABOR, MD 03/29/24 2135

## 2024-03-29 NOTE — ED Notes (Signed)
 Pause noted with return to SR rate 90's. Strips printed and given to MD Mian at the bedside.

## 2024-03-29 NOTE — ED Notes (Signed)
 CCMD notified of cardiac monitoring order.

## 2024-03-29 NOTE — Discharge Instructions (Signed)
 Your evaluation in the emergency department was notable for a heart rhythm called supraventricular tachycardia.  We were able to get you back to a normal heart rhythm.  Please continue to take all of your medications as prescribed and follow-up with your primary care provider, cardiologist, and nephrologist.  Return to the emergency department with any new or worsening symptoms.

## 2024-03-29 NOTE — ED Notes (Signed)
 Lab is running the original trop and BMP

## 2024-03-29 NOTE — ED Notes (Signed)
 ED Provider at bedside.

## 2024-03-29 NOTE — ED Triage Notes (Signed)
 Pt BIB [facility] from dialysis due to the patients ride (parents) not wanting to take the patient home after they were informed her VS were 96/54 and HR 130's. Pt has no complaints at this time. MD Clarine at triage.

## 2024-03-29 NOTE — ED Notes (Signed)
Lab called for trop add on

## 2024-04-10 ENCOUNTER — Ambulatory Visit: Attending: Cardiology

## 2024-04-10 ENCOUNTER — Encounter

## 2024-04-11 ENCOUNTER — Encounter

## 2024-04-11 NOTE — Progress Notes (Unsigned)
  Electrophysiology Office Follow up Visit Note:    Date:  04/11/2024   ID:  Nina Johnston, DOB August 15, 1960, MRN [id number]  PCP:  Auston Reyes BIRCH, MD  [facility] Cardiologist:  None  [facility] Electrophysiologist:  OLE ONEIDA HOLTS, MD    Interval History:     Nina Johnston is a 63 y.o. female who presents for a follow up visit.   The patient was recently seen in the hospital for SVT.  She was taken to the hospital from her dialysis unit by EMS for heart rates in the 130s.  She was asymptomatic.  She has a history of SVT, atrial fibrillation, hyperlipidemia, coronary artery disease, COPD and end-stage renal disease on dialysis.  Her heart rhythm returned to normal with a single dose of adenosine  6 mg through the IV.  She takes metoprolol  succinate once daily at bedtime.  ***Loop recorder missed the most recent episode.  Need to adjust zone to 120 bpm***      Past medical, surgical, social and family history were reviewed.  ROS:   Please see the history of present illness.    All other systems reviewed and are negative.  EKGs/Labs/Other Studies Reviewed:    The following studies were reviewed today:          Physical Exam:    VS:  There were no vitals taken for this visit.    Wt Readings from Last 3 Encounters:  03/29/24 187 lb (84.8 kg)  01/26/24 187 lb 6.4 oz (85 kg)  10/19/23 188 lb (85.3 kg)     GEN: no distress CARD: RRR, No MRG RESP: No IWOB. CTAB.      ASSESSMENT:    No diagnosis found. PLAN:    In order of problems listed above:  #SVT Recurrent.  Thankfully, her heart rates are relatively slow when she is out of rhythm.  Seems to be triggered by dialysis.  Recommend continuing metoprolol  succinate for now.  If the burden of her arrhythmia increases over time, could consider EP study and ablation although her multiple medical comorbidities put her at higher risk of complication during the procedure.  ***Adjust loop recorder heart  rate zone to 120  I discussed my upcoming departure from [facility] during today's clinic appointment.  The patient will continue to follow-up with one of my EP partners moving forward.  Follow-up in 3 months with EP APP to review burden with the adjusted loop recorder settings.   Signed, Ole Holts, MD, [facility], [facility] 04/11/2024 9:08 AM    Electrophysiology [facility]

## 2024-04-12 ENCOUNTER — Ambulatory Visit (INDEPENDENT_AMBULATORY_CARE_PROVIDER_SITE_OTHER): Admitting: Vascular Surgery

## 2024-04-12 ENCOUNTER — Encounter (INDEPENDENT_AMBULATORY_CARE_PROVIDER_SITE_OTHER)

## 2024-04-12 LAB — CUP PACEART REMOTE DEVICE CHECK
Date Time Interrogation Session: 20251206231823
Implantable Pulse Generator Implant Date: 20240619

## 2024-04-13 ENCOUNTER — Ambulatory Visit: Attending: Cardiology | Admitting: Cardiology

## 2024-04-13 ENCOUNTER — Encounter: Payer: Self-pay | Admitting: Cardiology

## 2024-04-13 VITALS — BP 125/50 | HR 75 | Ht 62.0 in | Wt 186.6 lb

## 2024-04-13 DIAGNOSIS — I471 Supraventricular tachycardia, unspecified: Secondary | ICD-10-CM

## 2024-04-13 NOTE — Patient Instructions (Signed)
 Medication Instructions:  Your physician recommends that you continue on your current medications as directed. Please refer to the Current Medication list given to you today.  *If you need a refill on your cardiac medications before your next appointment, please call your pharmacy*  Follow-Up: At [facility], you and your health needs are our priority.  As part of our continuing mission to provide you with exceptional heart care, our providers are all part of one team.  This team includes your primary Cardiologist (physician) and Advanced Practice Providers or APPs (Physician Assistants and Nurse Practitioners) who all work together to provide you with the care you need, when you need it.  Your next appointment:   3 months  Provider:   Sherie Don, NP

## 2024-04-19 NOTE — Progress Notes (Signed)
 Remote Loop Recorder Transmission

## 2024-04-21 ENCOUNTER — Ambulatory Visit: Admitting: Neurology

## 2024-04-22 ENCOUNTER — Ambulatory Visit: Payer: Self-pay | Admitting: Cardiology

## 2024-04-24 ENCOUNTER — Telehealth: Payer: Self-pay | Admitting: Neurology

## 2024-04-25 NOTE — Telephone Encounter (Signed)
 SABRA

## 2024-04-25 NOTE — Telephone Encounter (Signed)
 Pt mother called to request medication refill  ,Pt will take last pill tonight   Pt medication is to be sent to [facility]  (KLONOPIN ) 1 MG tablet   [id number] [place], [place] - [address] Phone: [phone]  Fax: [phone]

## 2024-04-26 ENCOUNTER — Emergency Department
Admission: EM | Admit: 2024-04-26 | Discharge: 2024-04-27 | Disposition: A | Discharge status: Home / Self Care | Attending: Emergency Medicine | Admitting: Emergency Medicine

## 2024-04-26 DIAGNOSIS — R55 Syncope and collapse: Secondary | ICD-10-CM | POA: Insufficient documentation

## 2024-04-26 LAB — COMPREHENSIVE METABOLIC PANEL WITH GFR
ALT: 15 U/L (ref 0–44)
AST: 21 U/L (ref 15–41)
Albumin: 4.6 g/dL (ref 3.5–5.0)
Alkaline Phosphatase: 172 U/L — ABNORMAL HIGH (ref 38–126)
Anion gap: 18 — ABNORMAL HIGH (ref 5–15)
BUN: 15 mg/dL (ref 8–23)
CO2: 29 mmol/L (ref 22–32)
Calcium: 9.1 mg/dL (ref 8.9–10.3)
Chloride: 96 mmol/L — ABNORMAL LOW (ref 98–111)
Creatinine, Ser: 2.78 mg/dL — ABNORMAL HIGH (ref 0.44–1.00)
GFR, Estimated: 18 mL/min — ABNORMAL LOW
Glucose, Bld: 194 mg/dL — ABNORMAL HIGH (ref 70–99)
Potassium: 3.6 mmol/L (ref 3.5–5.1)
Sodium: 142 mmol/L (ref 135–145)
Total Bilirubin: 0.3 mg/dL (ref 0.0–1.2)
Total Protein: 7.7 g/dL (ref 6.5–8.1)

## 2024-04-26 LAB — CBC
HCT: 33.5 % — ABNORMAL LOW (ref 36.0–46.0)
Hemoglobin: 10.8 g/dL — ABNORMAL LOW (ref 12.0–15.0)
MCH: 35.1 pg — ABNORMAL HIGH (ref 26.0–34.0)
MCHC: 32.2 g/dL (ref 30.0–36.0)
MCV: 108.8 fL — ABNORMAL HIGH (ref 80.0–100.0)
Platelets: 125 K/uL — ABNORMAL LOW (ref 150–400)
RBC: 3.08 MIL/uL — ABNORMAL LOW (ref 3.87–5.11)
RDW: 14.6 % (ref 11.5–15.5)
WBC: 10.5 K/uL (ref 4.0–10.5)
nRBC: 0 % (ref 0.0–0.2)

## 2024-04-26 LAB — TROPONIN T, HIGH SENSITIVITY
Troponin T High Sensitivity: 79 ng/L — ABNORMAL HIGH (ref 0–19)
Troponin T High Sensitivity: 76 ng/L — ABNORMAL HIGH (ref 0–19)

## 2024-04-26 LAB — LIPASE, BLOOD: Lipase: 43 U/L (ref 11–51)

## 2024-04-26 NOTE — Progress Notes (Signed)
 Received call from [facility] re: patient received in ED with accessed AVF/G. Writer at [facility] with patient on dialysis. Will be at [facility] around midnight to de access patient.

## 2024-04-26 NOTE — ED Triage Notes (Signed)
 Pt presents to the ED via [facility] from dialysis. Pt had a syncopal episode today at dialysis. Pt reports RUQ abdominal pressure. Pt A&Ox4. Dialysis port still accessed at this time.

## 2024-04-26 NOTE — ED Notes (Signed)
 Pt reports that she does not make urine

## 2024-04-26 NOTE — ED Provider Notes (Signed)
 "  [facility] Provider Note    Event Date/Time   First MD Initiated Contact with Patient 04/26/24 2111     (approximate)   History   Loss of Consciousness   HPI  Nina Johnston is a 63 y.o. female who presents to the emergency department today because of concerns for a syncopal episode.  Patient does have end-stage renal disease and was receiving dialysis when she started feeling a heat in her right arm and face.  She alerted nursing staff to this shortly after they came to her she passed out.  She denies any chest pain or palpitations.  States that she has not had any recent illness.  Does feel improved at the time of my exam.      Physical Exam   Triage Vital Signs: ED Triage Vitals  Encounter Vitals Group     BP 04/26/24 1639 122/64     Girls Systolic BP Percentile --      Girls Diastolic BP Percentile --      Boys Systolic BP Percentile --      Boys Diastolic BP Percentile --      Pulse Rate 04/26/24 1639 78     Resp 04/26/24 1639 18     Temp 04/26/24 1639 98.5 F (36.9 C)     Temp Source 04/26/24 1639 Oral     SpO2 04/26/24 1639 99 %     Weight 04/26/24 1640 185 lb (83.9 kg)     Height 04/26/24 1640 5' 2 (1.575 m)     Head Circumference --      Peak Flow --      Pain Score 04/26/24 1639 5     Pain Loc --      Pain Education --      Exclude from Growth Chart --     Most recent vital signs: Vitals:   04/26/24 1639 04/26/24 2023  BP: 122/64 (!) 151/73  Pulse: 78 69  Resp: 18 18  Temp: 98.5 F (36.9 C) 98.9 F (37.2 C)  SpO2: 99% 99%   General: Awake, alert, oriented. CV:  Good peripheral perfusion. Regular rate and rhythm. Resp:  Normal effort. Lungs clear. Abd:  No distention.    ED Results / Procedures / Treatments   Labs (all labs ordered are listed, but only abnormal results are displayed) Labs Reviewed  COMPREHENSIVE METABOLIC PANEL WITH GFR - Abnormal; Notable for the following components:      Result Value    Chloride 96 (*)    Glucose, Bld 194 (*)    Creatinine, Ser 2.78 (*)    Alkaline Phosphatase 172 (*)    GFR, Estimated 18 (*)    Anion gap 18 (*)    All other components within normal limits  CBC - Abnormal; Notable for the following components:   RBC 3.08 (*)    Hemoglobin 10.8 (*)    HCT 33.5 (*)    MCV 108.8 (*)    MCH 35.1 (*)    Platelets 125 (*)    All other components within normal limits  TROPONIN T, HIGH SENSITIVITY - Abnormal; Notable for the following components:   Troponin T High Sensitivity 79 (*)    All other components within normal limits  TROPONIN T, HIGH SENSITIVITY - Abnormal; Notable for the following components:   Troponin T High Sensitivity 76 (*)    All other components within normal limits  LIPASE, BLOOD     EKG  I, Guadalupe Eagles, attending  physician, personally viewed and interpreted this EKG  EKG Time: 1646 Rate: 75 Rhythm: sinus rhythm with PVC Axis: normal Intervals: qtc 518 QRS: narrow ST changes: no st elevation Impression: abnormal ekg   RADIOLOGY None  PROCEDURES:  Critical Care performed: No    MEDICATIONS ORDERED IN ED: Medications - No data to display   IMPRESSION / MDM / ASSESSMENT AND PLAN / ED COURSE  I reviewed the triage vital signs and the nursing notes.                              Differential diagnosis includes, but is not limited to, fluid shift, ACS, arrhythmia  Patient's presentation is most consistent with acute presentation with potential threat to life or bodily function.   The patient is on the cardiac monitor to evaluate for evidence of arrhythmia and/or significant heart rate changes.  Patient presented to the emergency department today because of concerns for a syncopal episode that occurred during dialysis.  At time of exam patient states she feels back to baseline.  EKG without concerning arrhythmia.  Initial troponin was elevated however was stable on repeat.  Do think elevation is likely related  more to chronic kidney disease than ACS.  Patient did feel comfortable with discharge home which I think is completely reasonable.  Do think it might be related to fluid shifts vasovagal syncope.      FINAL CLINICAL IMPRESSION(S) / ED DIAGNOSES   Final diagnoses:  Syncope, unspecified syncope type      Note:  This document was prepared using Dragon voice recognition software and may include unintentional dictation errors.    Floy Roberts, MD 04/26/24 [phone]  "

## 2024-04-26 NOTE — ED Notes (Signed)
Repeat troponin sent

## 2024-04-26 NOTE — ED Notes (Signed)
 This RN called and spoke with on call Dialysis RN requesting to have someone de-access port. Dialysis RN is currently at [facility]. Per RN, they will arrive around midnight to see this pt.

## 2024-04-26 NOTE — ED Triage Notes (Signed)
 Pt comes via EMS from dialysis with c/o syncopal episode pt treatment stopped and 20 mins left. Pt states pressure in chest.

## 2024-04-27 NOTE — ED Notes (Signed)
 Family requested an update regarding when dialysis RN would be arriving. Per Dialysis RN, She should arrive within the next hour.

## 2024-04-27 NOTE — Progress Notes (Signed)
 Patient's arterial cannulation deaccessed for discharge. Pressure to site for 12 minutes, homeostasis obtained

## 2024-04-27 NOTE — ED Notes (Signed)
 Dialysis port de-accessed and covered by marilyn (dialysis RN). Pt verbalizes understanding of discharge instructions. Opportunity for questioning and answers were provided. Pt discharged from ED with family.

## 2024-05-11 ENCOUNTER — Ambulatory Visit: Attending: Cardiology

## 2024-05-11 ENCOUNTER — Encounter

## 2024-05-11 DIAGNOSIS — Z8679 Personal history of other diseases of the circulatory system: Secondary | ICD-10-CM

## 2024-05-12 ENCOUNTER — Encounter

## 2024-05-12 LAB — CUP PACEART REMOTE DEVICE CHECK
Date Time Interrogation Session: 20260106232618
Implantable Pulse Generator Implant Date: 20240619

## 2024-05-14 ENCOUNTER — Ambulatory Visit: Payer: Self-pay | Admitting: Cardiology

## 2024-05-16 NOTE — Progress Notes (Signed)
 Remote Loop Recorder Transmission

## 2024-05-17 ENCOUNTER — Other Ambulatory Visit (INDEPENDENT_AMBULATORY_CARE_PROVIDER_SITE_OTHER)

## 2024-05-17 ENCOUNTER — Ambulatory Visit (INDEPENDENT_AMBULATORY_CARE_PROVIDER_SITE_OTHER): Admitting: Vascular Surgery

## 2024-05-17 ENCOUNTER — Encounter (INDEPENDENT_AMBULATORY_CARE_PROVIDER_SITE_OTHER): Payer: Self-pay | Admitting: Vascular Surgery

## 2024-05-17 VITALS — BP 171/75 | HR 70 | Resp 18 | Ht 62.5 in | Wt 191.0 lb

## 2024-05-17 DIAGNOSIS — I10 Essential (primary) hypertension: Secondary | ICD-10-CM

## 2024-05-17 DIAGNOSIS — N186 End stage renal disease: Secondary | ICD-10-CM | POA: Diagnosis not present

## 2024-05-17 DIAGNOSIS — Z992 Dependence on renal dialysis: Secondary | ICD-10-CM | POA: Diagnosis not present

## 2024-05-17 NOTE — Progress Notes (Signed)
 "   MRN : [id number]  Nina Johnston is a 64 y.o. (August 14, 1960) female who presents with chief complaint of  Chief Complaint  Patient presents with   Follow-up    6 month follow up HDA  .  History of Present Illness:   Discussed the use of AI scribe software for clinical note transcription with the patient, who gave verbal consent to proceed.  History of Present Illness Nina Johnston is a 64 year old female with end-stage renal disease on chronic hemodialysis who presents for vascular access follow-up and management of her left forearm arteriovenous fistula.  She reports no pain, swelling, or dysfunction of the fistula and has had no recent access-related complications during dialysis.  She was recently told there may be stenosis in the arm but does not know the specifics. She is concerned that most dialysis staff cannulate the same site rather than rotating, and she prefers site rotation to preserve fistula longevity. She recalls being told to avoid cannulation near her incision but is unsure which areas are safest for access and wants clarification on appropriate cannulation sites.  She has not had any recent procedures or interventions on the fistula.    Results Diagnostic Upper extremity arteriovenous fistula ultrasound: Patent arteriovenous fistula without significant stenosis; some atherosclerotic plaque in the radial artery proximal to the anastomosis, possibly creating a greater than 50% stenosis in the distal radial artery.  Current Outpatient Medications  Medication Sig Dispense Refill   B Complex-C-Folic Acid  (DIALYVITE TABLET) TABS Take 1 tablet by mouth daily.  12   clonazePAM  (KLONOPIN ) 1 MG tablet TAKE 1 TABLET BY MOUTH EVERY DAY 30 tablet 3   cloNIDine (CATAPRES - DOSED IN MG/24 HR) 0.2 mg/24hr patch Place 0.2 mg onto the skin once a week.     Continuous Blood Gluc Sensor (DEXCOM G6 SENSOR) MISC Use 1 every 10 days     Continuous Blood Gluc Transmit  (DEXCOM G6 TRANSMITTER) MISC USE EVERY 3 MONTHS     ethosuximide  (ZARONTIN ) 250 MG/5ML solution TAKE 5 MILLILITERS BY MOUTH TWICE A DAY AFTER HD TREATMENT ON DIALYSIS DAYS 300 mL 2   famotidine  (PEPCID ) 40 MG tablet SMARTSIG:1 Tablet(s) By Mouth Every Evening     Febuxostat  80 MG TABS Take 80 mg by mouth daily.     fexofenadine (ALLEGRA) 180 MG tablet Take 180 mg by mouth daily.     fluticasone -salmeterol (ADVAIR) 250-50 MCG/ACT AEPB Inhale 1 puff into the lungs in the morning and at bedtime.     furosemide  (LASIX ) 40 MG tablet Take 40 mg by mouth 2 (two) times daily. Sun, Tues, Thurs, Sat     Glucagon, rDNA, (GLUCAGON EMERGENCY) 1 MG KIT INJECT 1 MG INTO THE MUSCLE AS DIRECTED     insulin  glargine (LANTUS ) 100 UNIT/ML injection Inject 16 Units into the skin daily.     insulin  lispro (HUMALOG) 100 UNIT/ML injection Inject 8-15 Units into the skin 3 (three) times daily with meals. Per sliding scale     LAMICTAL  200 MG tablet Take 1.5 tablets (300 mg total) by mouth 2 (two) times daily. GLAXO BRAND NAME 270 tablet 1   levETIRAcetam  (KEPPRA ) 250 MG tablet Take Bid on non -dialysis days, 3 a day on hemodialysis days. 270 tablet 5   levothyroxine  (SYNTHROID ) 150 MCG tablet Take 150 mcg by mouth daily before breakfast.      Methoxy PEG-Epoetin Beta (MIRCERA IJ) Inject as directed. Receives w/ dialysis, unsure how often right now  metoprolol  succinate (TOPROL -XL) 100 MG 24 hr tablet Take 1 tablet (100 mg total) by mouth at bedtime. 180 tablet 3   montelukast  (SINGULAIR ) 10 MG tablet SMARTSIG:1 Tablet(s) By Mouth Every Evening     pantoprazole  (PROTONIX ) 40 MG tablet Take 40 mg by mouth every morning.      Probiotic Product (ALIGN) 4 MG CAPS Take 4 mg by mouth daily.      UNIFINE PENTIPS 32G X 4 MM MISC USE UP TO 5 TIMES DAILY AS DIRECTED  3   valsartan  (DIOVAN ) 80 MG tablet TAKE 1 TABLET BY MOUTH EVERY DAY 90 tablet 2   No current facility-administered medications for this visit.    Past Medical  History:  Diagnosis Date   A-V fistula    Acute bacterial sinusitis 04/19/2014   Adaptive colitis    Anginal pain    Anxiety    Anxiety and depression    Arthritis    Asthma    Ataxia 11/10/2012    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  On multiple psychotropic medications, and with DM>    Benign essential HTN 12/26/2014   Bipolar disorder (HCC)    Bronchitis    Carotid artery narrowing 12/22/2014   Cervical prolapse    CHF (congestive heart failure) (HCC)    CKD (chronic kidney disease)    Concussion 01/14/2014   Fall    COPD (chronic obstructive pulmonary disease) (HCC)    Coronary artery disease    Depression    Diabetes mellitus without complication (HCC)    Dialysis patient    Diplopia    Dupuytren's contracture of foot    Essential (primary) hypertension 02/12/2015   Family history of thyroid  problem    GERD (gastroesophageal reflux disease)    Gout    Heart disease    enlarged because of COPD   Heart murmur    Hepatomegaly    HLD (hyperlipidemia)    Hyperactive airway disease    Hyperkalemia    Hypertension    Hypothyroidism    Irritable colon    Morbid obesity (HCC)    Multiple thyroid  nodules    OAB (overactive bladder)    Pernicious anemia    Post menopausal syndrome    Post-concussion syndrome 03/08/2014   Post-traumatic brain syndrome    Prolapsed uterus    PSVT (paroxysmal supraventricular tachycardia)    Renal calculus    Renal disease    w/ GFR 29-may be due to diabetes   Seizures (HCC)    last seizure 2012. has had spells since with no knowledge. 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 [facility])    Transient alteration of awareness 11/10/2012   Urinary incontinence with continuous leakage 03/08/2014    Past Surgical History:  Procedure Laterality Date   A/V FISTULAGRAM Left 07/21/2016   Procedure: A/V Fistulagram;  Surgeon: Selinda GORMAN Gu, MD;  Location: [facility] INVASIVE  CV LAB;  Service: Cardiovascular;  Laterality: Left;   A/V FISTULAGRAM Left 12/24/2016   Procedure: A/V Fistulagram;  Surgeon: Gu Selinda GORMAN, MD;  Location: [facility] INVASIVE CV LAB;  Service: Cardiovascular;  Laterality: Left;   A/V FISTULAGRAM Left 06/29/2017   Procedure: A/V FISTULAGRAM;  Surgeon: Gu Selinda GORMAN, MD;  Location: [facility] INVASIVE CV LAB;  Service: Cardiovascular;  Laterality: Left;   A/V FISTULAGRAM Left 03/08/2018   Procedure: A/V FISTULAGRAM;  Surgeon: Gu Selinda GORMAN, MD;  Location: [facility]  INVASIVE CV LAB;  Service: Cardiovascular;  Laterality: Left;   A/V FISTULAGRAM Left 04/11/2019   Procedure: A/V FISTULAGRAM;  Surgeon: Marea Selinda RAMAN, MD;  Location: [facility] INVASIVE CV LAB;  Service: Cardiovascular;  Laterality: Left;   A/V FISTULAGRAM Left 06/25/2023   Procedure: A/V Fistulagram;  Surgeon: Marea Selinda RAMAN, MD;  Location: [facility] INVASIVE CV LAB;  Service: Cardiovascular;  Laterality: Left;   A/V SHUNT INTERVENTION N/A 07/21/2016   Procedure: A/V Shunt Intervention;  Surgeon: Selinda RAMAN Marea, MD;  Location: [facility] INVASIVE CV LAB;  Service: Cardiovascular;  Laterality: N/A;   A/V SHUNT INTERVENTION N/A 12/24/2016   Procedure: A/V SHUNT INTERVENTION;  Surgeon: Marea Selinda RAMAN, MD;  Location: [facility] INVASIVE CV LAB;  Service: Cardiovascular;  Laterality: N/A;   AVF     BIOPSY  02/23/2023   Procedure: BIOPSY;  Surgeon: Maryruth Ole DASEN, MD;  Location: [facility] ENDOSCOPY;  Service: Endoscopy;;   BREAST BIOPSY Left 06/12/2022   LEFT stereo bx, calcs, X clip-BENIGN FIBROFATTY BREAST TISSUE WITH FOCAL FATTY DEGENERATION AND ASSOCIATED COARSE DYSTROPHIC CALCIFICATIONS. - NEGATIVE FOR ATYPICAL PROLIFERATIVE BREAST DISEASE.   BREAST BIOPSY Left 06/12/2022   MM LT BREAST BX W LOC DEV 1ST LESION IMAGE BX SPEC STEREO GUIDE 06/12/2022 [facility]   CHOLECYSTECTOMY     COLONOSCOPY     COLONOSCOPY WITH PROPOFOL  N/A 03/18/2017   Procedure: COLONOSCOPY WITH PROPOFOL ;  Surgeon: Viktoria Lamar DASEN, MD;  Location: [facility]  ENDOSCOPY;  Service: Endoscopy;  Laterality: N/A;   COLONOSCOPY WITH PROPOFOL  N/A 02/23/2023   Procedure: COLONOSCOPY WITH PROPOFOL ;  Surgeon: Maryruth Ole DASEN, MD;  Location: [facility] ENDOSCOPY;  Service: Endoscopy;  Laterality: N/A;  DM & DIALYSIS PATIENT   ENDOMETRIAL BIOPSY     ablation, uterine   ESOPHAGEAL BRUSHING  02/23/2023   Procedure: ESOPHAGEAL BRUSHING;  Surgeon: Maryruth Ole DASEN, MD;  Location: [facility] ENDOSCOPY;  Service: Endoscopy;;   ESOPHAGOGASTRODUODENOSCOPY (EGD) WITH PROPOFOL  N/A 03/18/2017   Procedure: ESOPHAGOGASTRODUODENOSCOPY (EGD) WITH PROPOFOL ;  Surgeon: Viktoria Lamar DASEN, MD;  Location: [facility] ENDOSCOPY;  Service: Endoscopy;  Laterality: N/A;   ESOPHAGOGASTRODUODENOSCOPY (EGD) WITH PROPOFOL  N/A 02/23/2023   Procedure: ESOPHAGOGASTRODUODENOSCOPY (EGD) WITH PROPOFOL ;  Surgeon: Maryruth Ole DASEN, MD;  Location: [facility] ENDOSCOPY;  Service: Endoscopy;  Laterality: N/A;   HERNIA REPAIR     HERNIA REPAIR     PERIPHERAL VASCULAR CATHETERIZATION N/A 08/23/2015   Procedure: Dialysis/Perma Catheter Insertion;  Surgeon: Selinda RAMAN Marea, MD;  Location: [facility] INVASIVE CV LAB;  Service: Cardiovascular;  Laterality: N/A;   PERIPHERAL VASCULAR CATHETERIZATION N/A 11/09/2015   Procedure: Dialysis/Perma Catheter Removal;  Surgeon: Cordella KANDICE Shawl, MD;  Location: [facility] INVASIVE CV LAB;  Service: Cardiovascular;  Laterality: N/A;   PERIPHERAL VASCULAR CATHETERIZATION Left 04/23/2016   Procedure: A/V Fistulagram;  Surgeon: Selinda RAMAN Marea, MD;  Location: [facility] INVASIVE CV LAB;  Service: Cardiovascular;  Laterality: Left;   PORT A CATH REVISION       Social History[1]    Family History  Problem Relation Age of Onset   Diabetes Mother    Lumbar disc disease Mother    Migraines Mother    Hypertension Mother    Cancer - Prostate Father        mild   Diabetes Father    Hypertension Father    Breast cancer Sister 15   Ovarian cancer Neg Hx    Colon cancer Neg Hx    Heart disease Neg Hx     Kidney cancer Neg Hx    Bladder Cancer Neg Hx  Allergies[2]   REVIEW OF SYSTEMS (Negative unless checked)   Constitutional: [] Weight loss  [] Fever  [] Chills Cardiac: [] Chest pain   [] Chest pressure   [] Palpitations   [] Shortness of breath when laying flat   [] Shortness of breath at rest   [] Shortness of breath with exertion. Vascular:  [] Pain in legs with walking   [] Pain in legs at rest   [] Pain in legs when laying flat   [] Claudication   [] Pain in feet when walking  [] Pain in feet at rest  [] Pain in feet when laying flat   [] History of DVT   [] Phlebitis   [] Swelling in legs   [] Varicose veins   [] Non-healing ulcers Pulmonary:   [] Uses home oxygen   [] Productive cough   [] Hemoptysis   [] Wheeze  [x] COPD   [] Asthma Neurologic:  [] Dizziness  [] Blackouts   [x] Seizures   [] History of stroke   [] History of TIA  [] Aphasia   [] Temporary blindness   [] Dysphagia   [] Weakness or numbness in arms   [] Weakness or numbness in legs Musculoskeletal:  [] Arthritis   [] Joint swelling   [x] Joint pain   [] Low back pain Hematologic:  [x] Easy bruising  [] Easy bleeding   [] Hypercoagulable state   [x] Anemic   Gastrointestinal:  [] Blood in stool   [] Vomiting blood  [] Gastroesophageal reflux/heartburn   [] Abdominal pain Genitourinary:  [x] Chronic kidney disease   [] Difficult urination  [] Frequent urination  [] Burning with urination   [] Hematuria Skin:  [] Rashes   [] Ulcers   [] Wounds Psychological:  [] History of anxiety   []  History of major depression.  Physical Examination  BP (!) 171/75 (BP Location: Right Arm, Patient Position: Sitting, Cuff Size: Normal)   Pulse 70   Resp 18   Ht 5' 2.5 (1.588 m)   Wt 191 lb (86.6 kg)   BMI 34.38 kg/m  Gen:  WD/WN, NAD Head: [place]/AT, No temporalis wasting. Ear/Nose/Throat: Hearing grossly intact, nares w/o erythema or drainage Eyes: Conjunctiva clear. Sclera non-icteric Neck: Supple.  Trachea midline Pulmonary:  Good air movement, no use of accessory muscles.   Cardiac: RRR, no JVD Vascular: Mildly aneurysmal left radiocephalic AV fistula, good thrill Vessel Right Left  Radial Palpable Palpable               Musculoskeletal: M/S 5/5 throughout.  No deformity or atrophy. No edema. Neurologic: Sensation grossly intact in extremities.  Symmetrical.  Speech is fluent.  Psychiatric: Judgment intact, Mood & affect appropriate for pt's clinical situation. Dermatologic: No rashes or ulcers noted.  No cellulitis or open wounds.  Physical Exam     Labs Recent Results (from the past 2160 hours)  ECHOCARDIOGRAM COMPLETE     Status: None   Collection Time: 03/03/24  3:09 PM  Result Value Ref Range   AR max vel 2.79 cm2   AV Peak grad 14.4 mmHg   Ao pk vel 1.90 m/s   S' Lateral 3.80 cm   Area-P 1/2 3.37 cm2   AV Area VTI 3.09 cm2   AV Mean grad 8.0 mmHg   AV Area mean vel 2.90 cm2   Est EF 55 - 60%   CUP PACEART REMOTE DEVICE CHECK     Status: None   Collection Time: 03/09/24 11:44 PM  Result Value Ref Range   Date Time Interrogation Session [id number]    Pulse Generator Manufacturer MERM    Pulse Gen Model LNQ22 LINQ II    Pulse Gen Serial Number [id number]    Clinic Name [facility]    Implantable Pulse Generator  Type ICM/ILR    Implantable Pulse Generator Implant Date [id number]   CBC with Differential     Status: Abnormal   Collection Time: 03/29/24  5:44 PM  Result Value Ref Range   WBC 11.6 (H) 4.0 - 10.5 K/uL   RBC 2.92 (L) 3.87 - 5.11 MIL/uL   Hemoglobin 10.2 (L) 12.0 - 15.0 g/dL   HCT 68.1 (L) 63.9 - 53.9 %   MCV 108.9 (H) 80.0 - 100.0 fL   MCH 34.9 (H) 26.0 - 34.0 pg   MCHC 32.1 30.0 - 36.0 g/dL   RDW 85.6 88.4 - 84.4 %   Platelets 123 (L) 150 - 400 K/uL   nRBC 0.0 0.0 - 0.2 %   Neutrophils Relative % 76 %   Neutro Abs 8.8 (H) 1.7 - 7.7 K/uL   Lymphocytes Relative 17 %   Lymphs Abs 2.0 0.7 - 4.0 K/uL   Monocytes Relative 5 %   Monocytes Absolute 0.6 0.1 - 1.0 K/uL   Eosinophils Relative 0 %   Eosinophils  Absolute 0.0 0.0 - 0.5 K/uL   Basophils Relative 1 %   Basophils Absolute 0.1 0.0 - 0.1 K/uL   Immature Granulocytes 1 %   Abs Immature Granulocytes 0.14 (H) 0.00 - 0.07 K/uL    Comment: Performed at [facility], [address] [place], [place] [address]  Comprehensive metabolic panel     Status: Abnormal   Collection Time: 03/29/24  5:44 PM  Result Value Ref Range   Sodium 138 135 - 145 mmol/L   Potassium 3.2 (L) 3.5 - 5.1 mmol/L   Chloride 95 (L) 98 - 111 mmol/L   CO2 27 22 - 32 mmol/L   Glucose, Bld 429 (H) 70 - 99 mg/dL    Comment: Glucose reference range applies only to samples taken after fasting for at least 8 hours.   BUN 13 8 - 23 mg/dL   Creatinine, Ser 7.39 (H) 0.44 - 1.00 mg/dL   Calcium 8.6 (L) 8.9 - 10.3 mg/dL   Total Protein 6.8 6.5 - 8.1 g/dL   Albumin 4.1 3.5 - 5.0 g/dL   AST 19 15 - 41 U/L   ALT 16 0 - 44 U/L   Alkaline Phosphatase 156 (H) 38 - 126 U/L   Total Bilirubin 0.3 0.0 - 1.2 mg/dL   GFR, Estimated 20 (L) >60 mL/min    Comment: (NOTE) Calculated using the CKD-EPI Creatinine Equation (2021)    Anion gap 16 (H) 5 - 15    Comment: Performed at [facility], [address] [place], [place] [address]  Troponin T, High Sensitivity     Status: Abnormal   Collection Time: 03/29/24  5:44 PM  Result Value Ref Range   Troponin T High Sensitivity 41 (H) 0 - 19 ng/L    Comment: (NOTE) Biotin concentrations > 1000 ng/mL falsely decrease TnT results.  Serial cardiac troponin measurements are suggested.  Refer to the Links section for chest pain algorithms and additional  guidance. Performed at [facility], [address] [place], [place] [address]   Pro Brain natriuretic peptide     Status: Abnormal   Collection Time: 03/29/24  5:44 PM  Result Value Ref Range   Pro Brain Natriuretic Peptide 10,689.0 (H) <300.0 pg/mL    Comment: (NOTE) Age Group        Cut-Points    Interpretation  < 50 years     450 pg/mL       NT-proBNP  >  450 pg/mL indicates                                ADHF is likely              50 to 75 years  900 pg/mL      NT-proBNP > 900 pg/mL indicates          ADHF is likely  > 75 years      1800 pg/mL     NT-proBNP > 1800 pg/mL indicates          ADHF is likely                           All ages    Results between       Indeterminate. Further clinical             300 and the cut-   information is needed to determine            point for age group   if ADHF is present.                                                             Elecsys proBNP II/ Elecsys proBNP II STAT           Cut-Point                       Interpretation  300 pg/mL                    NT-proBNP <300pg/mL indicates                             ADHF is not likely  Performed at [facility], [address] [place], [place] [address]   Troponin T, High Sensitivity     Status: Abnormal   Collection Time: 03/29/24  7:58 PM  Result Value Ref Range   Troponin T High Sensitivity 51 (H) 0 - 19 ng/L    Comment: (NOTE) Biotin concentrations > 1000 ng/mL falsely decrease TnT results.  Serial cardiac troponin measurements are suggested.  Refer to the Links section for chest pain algorithms and additional  guidance. Performed at [facility], [address] [place], [place] [address]   CBG monitoring, ED     Status: Abnormal   Collection Time: 03/29/24  9:32 PM  Result Value Ref Range   Glucose-Capillary 267 (H) 70 - 99 mg/dL    Comment: Glucose reference range applies only to samples taken after fasting for at least 8 hours.  CUP PACEART REMOTE DEVICE CHECK     Status: None   Collection Time: 04/09/24 11:18 PM  Result Value Ref Range   Date Time Interrogation Session [id number]    Pulse Generator Manufacturer MERM    Pulse Gen Model LNQ22 GARR HEATH    Pulse Gen Serial Number [id number]    Clinic Name [facility]    Implantable Pulse Generator Type ICM/ILR    Implantable Pulse Generator  Implant Date [id number]   Comprehensive metabolic panel     Status: Abnormal   Collection Time: 04/26/24  4:44 PM  Result Value Ref Range   Sodium 142 135 - 145 mmol/L   Potassium 3.6 3.5 - 5.1 mmol/L   Chloride 96 (L) 98 - 111 mmol/L   CO2 29 22 - 32 mmol/L   Glucose, Bld 194 (H) 70 - 99 mg/dL    Comment: Glucose reference range applies only to samples taken after fasting for at least 8 hours.   BUN 15 8 - 23 mg/dL   Creatinine, Ser 7.21 (H) 0.44 - 1.00 mg/dL   Calcium 9.1 8.9 - 89.6 mg/dL   Total Protein 7.7 6.5 - 8.1 g/dL   Albumin 4.6 3.5 - 5.0 g/dL   AST 21 15 - 41 U/L   ALT 15 0 - 44 U/L   Alkaline Phosphatase 172 (H) 38 - 126 U/L   Total Bilirubin 0.3 0.0 - 1.2 mg/dL   GFR, Estimated 18 (L) >60 mL/min    Comment: (NOTE) Calculated using the CKD-EPI Creatinine Equation (2021)    Anion gap 18 (H) 5 - 15    Comment: Performed at [facility], [address] [place], [place] [address]  CBC     Status: Abnormal   Collection Time: 04/26/24  4:44 PM  Result Value Ref Range   WBC 10.5 4.0 - 10.5 K/uL   RBC 3.08 (L) 3.87 - 5.11 MIL/uL   Hemoglobin 10.8 (L) 12.0 - 15.0 g/dL   HCT 66.4 (L) 63.9 - 53.9 %   MCV 108.8 (H) 80.0 - 100.0 fL   MCH 35.1 (H) 26.0 - 34.0 pg   MCHC 32.2 30.0 - 36.0 g/dL   RDW 85.3 88.4 - 84.4 %   Platelets 125 (L) 150 - 400 K/uL   nRBC 0.0 0.0 - 0.2 %    Comment: Performed at [facility], [address] [place], [place] [address]  Troponin T, High Sensitivity     Status: Abnormal   Collection Time: 04/26/24  8:27 PM  Result Value Ref Range   Troponin T High Sensitivity 79 (H) 0 - 19 ng/L    Comment: (NOTE) Biotin concentrations > 1000 ng/mL falsely decrease TnT results.  Serial cardiac troponin measurements are suggested.  Refer to the Links section for chest pain algorithms and additional  guidance. Performed at [facility], [address] [place], [place] [address]   Lipase, blood     Status: None   Collection  Time: 04/26/24  8:27 PM  Result Value Ref Range   Lipase 43 11 - 51 U/L    Comment: Performed at [facility], [address] [place], [place] [address]  Troponin T, High Sensitivity     Status: Abnormal   Collection Time: 04/26/24 10:30 PM  Result Value Ref Range   Troponin T High Sensitivity 76 (H) 0 - 19 ng/L    Comment: (NOTE) Biotin concentrations > 1000 ng/mL falsely decrease TnT results.  Serial cardiac troponin measurements are suggested.  Refer to the Links section for chest pain algorithms and additional  guidance. Performed at [facility], [address] [place], [place] [address]   CUP PACEART REMOTE DEVICE CHECK     Status: None   Collection Time: 05/10/24 11:26 PM  Result Value Ref Range   Date Time Interrogation Session [id number]    Pulse Generator Manufacturer MERM    Pulse Gen Model LNQ22 GARR HEATH    Pulse Gen Serial Number [id number]    Clinic Name [facility]    Implantable Pulse Generator Type ICM/ILR  Implantable Pulse Generator Implant Date [id number]     Radiology CUP PACEART REMOTE DEVICE CHECK Result Date: 05/12/2024 ILR summary report received. Battery status OK. Normal device function. No new symptom, tachy, brady, or pause episodes. No new AF episodes. Monthly summary reports and ROV/PRN LA, CVRS   Assessment/Plan  No problem-specific Assessment & Plan notes found for this encounter.  Assessment & Plan Hemodialysis arteriovenous fistula management Forearm arteriovenous fistula patent and functioning well. Ultrasound showed Patent arteriovenous fistula without significant stenosis; some atherosclerotic plaque in the radial artery proximal to the anastomosis, possibly creating a greater than 50% stenosis in the distal radial artery.. Concern for repeated cannulation at same site affecting longevity. - Reviewed ultrasound findings with her. - Discussed importance of rotating cannulation sites. - Planned six-month  surveillance ultrasounds. - Discussed future vascular access options, including jump graft or new upper arm fistula.  End stage renal disease on dialysis Maintained on chronic hemodialysis with good vascular access, no current complications. - Continued current dialysis regimen. - Monitored for access complications, planned reassessment in six months.  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 Also, involved in wound healing. On appropriate medications.     HTN (hypertension) blood pressure control important in reducing the progression of atherosclerotic disease. On appropriate oral medications.    Selinda Gu, MD  05/17/2024 10:08 AM    This note was created with Dragon medical transcription system.  Any errors from dictation are purely unintentional     [1]  Social History Tobacco Use   Smoking status: Never   Smokeless tobacco: Never  Vaping Use   Vaping status: Never Used  Substance Use Topics   Alcohol use: No   Drug use: No  [2]  Allergies Allergen Reactions   Cinnamon Other (See Comments)    Raises blood pressure   Lamotrigine  Other (See Comments)    Generic lamotrigine  triggers seizures, grand mal. TOLERATES BRAND LAMICTAL  ONLY (GSK)   Procrit [Epoetin Alfa-Epbx] Other (See Comments)    Seizures, grand mal   Ace Inhibitors Cough   Azithromycin Other (See Comments)    seizures   Iron  Other (See Comments)    Can not take in IV Form - causes seizures    Neomycin -Bacitracin  Zn-Polymyx Other (See Comments)    Unknown   Other Other (See Comments)    Pt reports Z-pack induces lip tingling, facial swelling   Sulfa Antibiotics Other (See Comments)    Due to risks for seizures.   "

## 2024-06-01 ENCOUNTER — Telehealth: Payer: Self-pay | Admitting: Neurology

## 2024-06-01 DIAGNOSIS — R4789 Other speech disturbances: Secondary | ICD-10-CM

## 2024-06-01 DIAGNOSIS — G40209 Localization-related (focal) (partial) symptomatic epilepsy and epileptic syndromes with complex partial seizures, not intractable, without status epilepticus: Secondary | ICD-10-CM

## 2024-06-01 DIAGNOSIS — R270 Ataxia, unspecified: Secondary | ICD-10-CM

## 2024-06-01 MED ORDER — ETHOSUXIMIDE 250 MG/5ML PO SOLN: ORAL | 2 refills | Status: DC

## 2024-06-01 NOTE — Telephone Encounter (Signed)
 Pt mother called to request medication refill   ethosuximide  (ZARONTIN ) 250 MG/5ML solution  Pt medication is to be sent to

## 2024-06-01 NOTE — Addendum Note (Signed)
 Addended by: ROBBERT GOSLING D on: 06/01/2024 02:06 PM   Modules accepted: Orders

## 2024-06-01 NOTE — Telephone Encounter (Signed)
 Rx sent.

## 2024-06-01 NOTE — Telephone Encounter (Signed)
" °  [id number], [place] - [address] (Ph: [phone])   "

## 2024-06-07 ENCOUNTER — Ambulatory Visit: Admitting: Neurology

## 2024-06-07 ENCOUNTER — Encounter: Payer: Self-pay | Admitting: Neurology

## 2024-06-07 DIAGNOSIS — R404 Transient alteration of awareness: Secondary | ICD-10-CM

## 2024-06-07 DIAGNOSIS — R4789 Other speech disturbances: Secondary | ICD-10-CM | POA: Diagnosis not present

## 2024-06-07 DIAGNOSIS — R270 Ataxia, unspecified: Secondary | ICD-10-CM | POA: Diagnosis not present

## 2024-06-07 DIAGNOSIS — G40209 Localization-related (focal) (partial) symptomatic epilepsy and epileptic syndromes with complex partial seizures, not intractable, without status epilepticus: Secondary | ICD-10-CM

## 2024-06-07 MED ORDER — LEVETIRACETAM 250 MG PO TABS: ORAL_TABLET | ORAL | 5 refills | Status: AC

## 2024-06-07 MED ORDER — LAMICTAL 200 MG PO TABS: 300.0000 mg | ORAL_TABLET | Freq: Two times a day (BID) | ORAL | 1 refills | Status: AC

## 2024-06-07 MED ORDER — CLONAZEPAM 1 MG PO TABS: 1.0000 mg | ORAL_TABLET | Freq: Every day | ORAL | 2 refills | Status: AC

## 2024-06-07 MED ORDER — ETHOSUXIMIDE 250 MG/5ML PO SOLN: ORAL | 2 refills | Status: AC

## 2024-06-07 NOTE — Patient Instructions (Signed)
 ASSESSMENT AND PLAN :   64 y.o. year old female  here with currently well controlled seizure :   RV in 6 months  :   1) seizure medication management - we reduce ethosuximide  from 5 ml bid to 2.5 ml bid.    We plan to get to ethosuximide   On dialysis days only in another 6 months.   2) keep fluid restrictions. Avoid fluid overload.   3) refills for Klonopin ,  Keppra , Lamictal >  no dose changes, these medication will remain indefinitely.     I would like to thank Auston Reyes BIRCH, MD or allowing me to meet with this pleasant patient.    Any patient with sleepiness should be cautioned not to drive, work at heights, or operate dangerous or heavy equipment when feeling tired or sleepy.   The patient's condition requires frequent monitoring and adjustments in the treatment plan, reflecting the ongoing complexity of care.  This provider is the continuing focal point for all needed services for this condition.  After spending a total time of  35  minutes face to face and time for  history taking, physical and neurologic examination, review of laboratory studies,  personal review of imaging studies, reports and results of other testing and review of referral information / records as far as provided in visit,   Electronically signed by: Dedra Gores, MD 06/07/2024 1:42 PM

## 2024-06-11 ENCOUNTER — Ambulatory Visit

## 2024-06-11 ENCOUNTER — Encounter

## 2024-06-13 ENCOUNTER — Encounter

## 2024-07-12 ENCOUNTER — Ambulatory Visit

## 2024-07-14 ENCOUNTER — Encounter

## 2024-07-14 ENCOUNTER — Ambulatory Visit: Admitting: Cardiology

## 2024-08-12 ENCOUNTER — Ambulatory Visit

## 2024-08-15 ENCOUNTER — Encounter

## 2024-11-15 ENCOUNTER — Encounter (INDEPENDENT_AMBULATORY_CARE_PROVIDER_SITE_OTHER)

## 2024-11-15 ENCOUNTER — Ambulatory Visit (INDEPENDENT_AMBULATORY_CARE_PROVIDER_SITE_OTHER): Admitting: Vascular Surgery

## 2024-12-08 ENCOUNTER — Ambulatory Visit: Admitting: Neurology
